# Patient Record
Sex: Male | Born: 1960
Health system: Southern US, Community
[De-identification: ages and names within clinical notes are randomized; demographics above are authoritative.]

## PROBLEM LIST (undated history)

## (undated) DIAGNOSIS — E119 Type 2 diabetes mellitus without complications: Secondary | ICD-10-CM

## (undated) DIAGNOSIS — C8293 Follicular lymphoma, unspecified, intra-abdominal lymph nodes: Secondary | ICD-10-CM

## (undated) DIAGNOSIS — J849 Interstitial pulmonary disease, unspecified: Secondary | ICD-10-CM

## (undated) DIAGNOSIS — K5792 Diverticulitis of intestine, part unspecified, without perforation or abscess without bleeding: Secondary | ICD-10-CM

## (undated) DIAGNOSIS — U071 COVID-19: Secondary | ICD-10-CM

## (undated) DIAGNOSIS — J9611 Chronic respiratory failure with hypoxia: Secondary | ICD-10-CM

## (undated) HISTORY — PX: COLON RESECTION: SHX5231

## (undated) HISTORY — DX: COVID-19: U07.1

## (undated) HISTORY — PX: CHOLECYSTECTOMY: SHX55

## (undated) HISTORY — DX: Type 2 diabetes mellitus without complications: E11.9

---
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
## 1998-02-02 ENCOUNTER — Emergency Department (HOSPITAL_COMMUNITY): Admission: EM | Admit: 1998-02-02 | Discharge: 1998-02-03 | Payer: Self-pay | Admitting: Emergency Medicine

## 2008-02-11 ENCOUNTER — Inpatient Hospital Stay: Payer: Self-pay | Admitting: Internal Medicine

## 2008-04-14 ENCOUNTER — Ambulatory Visit: Payer: Self-pay | Admitting: Gastroenterology

## 2008-06-05 ENCOUNTER — Ambulatory Visit: Payer: Self-pay | Admitting: Surgery

## 2008-06-11 ENCOUNTER — Inpatient Hospital Stay: Payer: Self-pay | Admitting: Surgery

## 2010-08-31 ENCOUNTER — Emergency Department: Payer: Self-pay | Admitting: Emergency Medicine

## 2010-09-03 ENCOUNTER — Encounter: Payer: Self-pay | Admitting: Emergency Medicine

## 2010-10-01 ENCOUNTER — Encounter: Payer: Self-pay | Admitting: Emergency Medicine

## 2013-07-15 ENCOUNTER — Ambulatory Visit: Payer: Self-pay | Admitting: Internal Medicine

## 2013-11-28 ENCOUNTER — Emergency Department: Payer: Self-pay | Admitting: Emergency Medicine

## 2019-01-29 ENCOUNTER — Other Ambulatory Visit: Payer: Self-pay

## 2019-01-29 ENCOUNTER — Inpatient Hospital Stay: Payer: BC Managed Care – PPO

## 2019-01-29 ENCOUNTER — Emergency Department: Payer: BC Managed Care – PPO

## 2019-01-29 ENCOUNTER — Encounter
Admission: EM | Disposition: A | Discharge status: Home / Self Care | Payer: Self-pay | Source: Home / Self Care | Attending: Internal Medicine

## 2019-01-29 ENCOUNTER — Inpatient Hospital Stay
Admission: EM | Admit: 2019-01-29 | Discharge: 2019-02-01 | DRG: 163 | Disposition: A | Discharge status: Home / Self Care | Payer: BC Managed Care – PPO | Attending: Internal Medicine | Admitting: Internal Medicine

## 2019-01-29 ENCOUNTER — Encounter: Payer: Self-pay | Admitting: Emergency Medicine

## 2019-01-29 ENCOUNTER — Inpatient Hospital Stay (HOSPITAL_COMMUNITY)
Admit: 2019-01-29 | Discharge: 2019-01-29 | Disposition: A | Discharge status: Home / Self Care | Payer: BC Managed Care – PPO | Attending: Vascular Surgery | Admitting: Vascular Surgery

## 2019-01-29 DIAGNOSIS — R599 Enlarged lymph nodes, unspecified: Secondary | ICD-10-CM | POA: Diagnosis not present

## 2019-01-29 DIAGNOSIS — E1165 Type 2 diabetes mellitus with hyperglycemia: Secondary | ICD-10-CM

## 2019-01-29 DIAGNOSIS — R590 Localized enlarged lymph nodes: Secondary | ICD-10-CM

## 2019-01-29 DIAGNOSIS — D751 Secondary polycythemia: Secondary | ICD-10-CM | POA: Diagnosis present

## 2019-01-29 DIAGNOSIS — I82412 Acute embolism and thrombosis of left femoral vein: Secondary | ICD-10-CM | POA: Diagnosis not present

## 2019-01-29 DIAGNOSIS — I2782 Chronic pulmonary embolism: Secondary | ICD-10-CM

## 2019-01-29 DIAGNOSIS — I2609 Other pulmonary embolism with acute cor pulmonale: Secondary | ICD-10-CM | POA: Diagnosis not present

## 2019-01-29 DIAGNOSIS — Z23 Encounter for immunization: Secondary | ICD-10-CM

## 2019-01-29 DIAGNOSIS — N2889 Other specified disorders of kidney and ureter: Secondary | ICD-10-CM | POA: Diagnosis not present

## 2019-01-29 DIAGNOSIS — M79669 Pain in unspecified lower leg: Secondary | ICD-10-CM

## 2019-01-29 DIAGNOSIS — K668 Other specified disorders of peritoneum: Secondary | ICD-10-CM | POA: Diagnosis not present

## 2019-01-29 DIAGNOSIS — E119 Type 2 diabetes mellitus without complications: Secondary | ICD-10-CM | POA: Diagnosis present

## 2019-01-29 DIAGNOSIS — I2699 Other pulmonary embolism without acute cor pulmonale: Secondary | ICD-10-CM | POA: Diagnosis not present

## 2019-01-29 DIAGNOSIS — D72829 Elevated white blood cell count, unspecified: Secondary | ICD-10-CM | POA: Diagnosis not present

## 2019-01-29 DIAGNOSIS — J9601 Acute respiratory failure with hypoxia: Secondary | ICD-10-CM | POA: Diagnosis present

## 2019-01-29 DIAGNOSIS — I361 Nonrheumatic tricuspid (valve) insufficiency: Secondary | ICD-10-CM

## 2019-01-29 DIAGNOSIS — Z20828 Contact with and (suspected) exposure to other viral communicable diseases: Secondary | ICD-10-CM | POA: Diagnosis not present

## 2019-01-29 DIAGNOSIS — R0602 Shortness of breath: Secondary | ICD-10-CM | POA: Diagnosis not present

## 2019-01-29 DIAGNOSIS — I82409 Acute embolism and thrombosis of unspecified deep veins of unspecified lower extremity: Secondary | ICD-10-CM | POA: Diagnosis not present

## 2019-01-29 HISTORY — PX: PULMONARY THROMBECTOMY: CATH118295

## 2019-01-29 HISTORY — DX: Diverticulitis of intestine, part unspecified, without perforation or abscess without bleeding: K57.92

## 2019-01-29 LAB — SARS CORONAVIRUS 2 BY RT PCR (HOSPITAL ORDER, PERFORMED IN ~~LOC~~ HOSPITAL LAB): SARS Coronavirus 2: NEGATIVE

## 2019-01-29 LAB — BASIC METABOLIC PANEL
Anion gap: 13 (ref 5–15)
BUN: 15 mg/dL (ref 6–20)
CO2: 21 mmol/L — ABNORMAL LOW (ref 22–32)
Calcium: 9.5 mg/dL (ref 8.9–10.3)
Chloride: 100 mmol/L (ref 98–111)
Creatinine, Ser: 1.1 mg/dL (ref 0.61–1.24)
GFR calc Af Amer: >60 mL/min (ref >60)
GFR calc non Af Amer: >60 mL/min (ref >60)
Glucose, Bld: 313 mg/dL — ABNORMAL HIGH (ref 70–99)
Potassium: 4.6 mmol/L (ref 3.5–5.1)
Sodium: 134 mmol/L — ABNORMAL LOW (ref 135–145)

## 2019-01-29 LAB — CBC
HCT: 54.8 % — ABNORMAL HIGH (ref 39.0–52.0)
Hemoglobin: 19.2 g/dL — ABNORMAL HIGH (ref 13.0–17.0)
MCH: 30.5 pg (ref 26.0–34.0)
MCHC: 35 g/dL (ref 30.0–36.0)
MCV: 87.1 fL (ref 80.0–100.0)
Platelets: 220 10*3/uL (ref 150–400)
RBC: 6.29 MIL/uL — ABNORMAL HIGH (ref 4.22–5.81)
RDW: 12.1 % (ref 11.5–15.5)
WBC: 11.1 10*3/uL — ABNORMAL HIGH (ref 4.0–10.5)
nRBC: 0 % (ref 0.0–0.2)

## 2019-01-29 LAB — PROTIME-INR
INR: 1.1 (ref 0.8–1.2)
Prothrombin Time: 14.5 seconds (ref 11.4–15.2)

## 2019-01-29 LAB — APTT: aPTT: 30 seconds (ref 24–36)

## 2019-01-29 LAB — HEPARIN LEVEL (UNFRACTIONATED): Heparin Unfractionated: 0.26 IU/mL — ABNORMAL LOW (ref 0.30–0.70)

## 2019-01-29 SURGERY — PULMONARY THROMBECTOMY
Anesthesia: Moderate Sedation

## 2019-01-29 MED ORDER — ONDANSETRON HCL 4 MG PO TABS: 4.0000 mg | ORAL_TABLET | Freq: Four times a day (QID) | ORAL | Status: DC | PRN

## 2019-01-29 MED ORDER — HEPARIN (PORCINE) 25000 UT/250ML-% IV SOLN
1850.0000 [IU]/h | INTRAVENOUS | Status: DC
  Administered 2019-01-29: 1650 [IU]/h via INTRAVENOUS
  Administered 2019-01-30 – 2019-01-31 (×3): 1850 [IU]/h via INTRAVENOUS
  Filled 2019-01-29 (×4): qty 250

## 2019-01-29 MED ORDER — ALTEPLASE 2 MG IJ SOLR: INTRAMUSCULAR | Status: DC | PRN

## 2019-01-29 MED ORDER — MIDAZOLAM HCL 2 MG/2ML IJ SOLN
INTRAMUSCULAR | Status: DC | PRN
  Administered 2019-01-29 (×2): 2 mg via INTRAVENOUS

## 2019-01-29 MED ORDER — FENTANYL CITRATE (PF) 100 MCG/2ML IJ SOLN
INTRAMUSCULAR | Status: DC | PRN
  Administered 2019-01-29 (×2): 50 ug via INTRAVENOUS

## 2019-01-29 MED ORDER — ALTEPLASE 2 MG IJ SOLR
INTRAMUSCULAR | Status: DC | PRN
  Administered 2019-01-29: 3 mg
  Administered 2019-01-29: 5 mg

## 2019-01-29 MED ORDER — HEPARIN SODIUM (PORCINE) 1000 UNIT/ML IJ SOLN
INTRAMUSCULAR | Status: DC | PRN
  Administered 2019-01-29: 3000 [IU] via INTRAVENOUS

## 2019-01-29 MED ORDER — IODIXANOL 320 MG/ML IV SOLN
INTRAVENOUS | Status: DC | PRN
  Administered 2019-01-29: 65 mL via INTRAVENOUS

## 2019-01-29 MED ORDER — ACETAMINOPHEN 650 MG RE SUPP: 650.0000 mg | Freq: Four times a day (QID) | RECTAL | Status: DC | PRN

## 2019-01-29 MED ORDER — INFLUENZA VAC SPLIT QUAD 0.5 ML IM SUSY
0.5000 mL | PREFILLED_SYRINGE | INTRAMUSCULAR | Status: AC
  Administered 2019-01-30: 0.5 mL via INTRAMUSCULAR
  Filled 2019-01-29: qty 0.5

## 2019-01-29 MED ORDER — ONDANSETRON HCL 4 MG/2ML IJ SOLN: 4.0000 mg | Freq: Four times a day (QID) | INTRAMUSCULAR | Status: DC | PRN

## 2019-01-29 MED ORDER — SODIUM CHLORIDE 0.9 % IV SOLN
Freq: Once | INTRAVENOUS | Status: AC
  Administered 2019-01-29: 16:00:00 via INTRAVENOUS

## 2019-01-29 MED ORDER — FENTANYL CITRATE (PF) 100 MCG/2ML IJ SOLN
INTRAMUSCULAR | Status: AC
  Filled 2019-01-29: qty 2

## 2019-01-29 MED ORDER — SODIUM CHLORIDE 0.9 % IV SOLN
INTRAVENOUS | Status: DC
  Administered 2019-01-29 – 2019-01-30 (×3): via INTRAVENOUS

## 2019-01-29 MED ORDER — ACETAMINOPHEN 325 MG PO TABS
650.0000 mg | ORAL_TABLET | Freq: Four times a day (QID) | ORAL | Status: DC | PRN
  Administered 2019-01-31: 650 mg via ORAL
  Filled 2019-01-29: qty 2

## 2019-01-29 MED ORDER — HEPARIN SODIUM (PORCINE) 1000 UNIT/ML IJ SOLN
INTRAMUSCULAR | Status: AC
  Filled 2019-01-29: qty 1

## 2019-01-29 MED ORDER — SODIUM CHLORIDE 0.9 % IV BOLUS
1000.0000 mL | Freq: Once | INTRAVENOUS | Status: AC
  Administered 2019-01-29: 1000 mL via INTRAVENOUS

## 2019-01-29 MED ORDER — CLINDAMYCIN PHOSPHATE 300 MG/50ML IV SOLN
INTRAVENOUS | Status: AC
  Administered 2019-01-29: 300 mg via INTRAVENOUS
  Filled 2019-01-29: qty 50

## 2019-01-29 MED ORDER — IOHEXOL 350 MG/ML SOLN
75.0000 mL | Freq: Once | INTRAVENOUS | Status: AC | PRN
  Administered 2019-01-29: 75 mL via INTRAVENOUS

## 2019-01-29 MED ORDER — MIDAZOLAM HCL 5 MG/5ML IJ SOLN
INTRAMUSCULAR | Status: AC
  Filled 2019-01-29: qty 5

## 2019-01-29 MED ORDER — HEPARIN BOLUS VIA INFUSION
6000.0000 [IU] | Freq: Once | INTRAVENOUS | Status: AC
  Administered 2019-01-29: 6000 [IU] via INTRAVENOUS
  Filled 2019-01-29: qty 6000

## 2019-01-29 MED ORDER — CLINDAMYCIN PHOSPHATE 300 MG/50ML IV SOLN
300.0000 mg | Freq: Once | INTRAVENOUS | Status: AC
  Administered 2019-01-29 (×2): 300 mg via INTRAVENOUS
  Filled 2019-01-29: qty 50

## 2019-01-29 SURGICAL SUPPLY — 18 items
CANISTER PENUMBRA ENGINE (MISCELLANEOUS) ×2 IMPLANT
CATH ANGIO 5F 100CM .035 PIG (CATHETERS) ×2 IMPLANT
CATH INDIGO 12HTORQ 100 (CATHETERS) ×2 IMPLANT
CATH INDIGO SEP 12 (CATHETERS) ×2 IMPLANT
CATH INFINITI JR4 5F (CATHETERS) ×2 IMPLANT
CATH SELECT BERN TIP 5F 130 (CATHETERS) ×2 IMPLANT
DEVICE SAFEGUARD 24CM (GAUZE/BANDAGES/DRESSINGS) ×2 IMPLANT
GLIDEWIRE ADV .035X260CM (WIRE) ×2 IMPLANT
NDL ENTRY 21GA 7CM ECHOTIP (NEEDLE) IMPLANT
NEEDLE ENTRY 21GA 7CM ECHOTIP (NEEDLE) ×3 IMPLANT
PACK ANGIOGRAPHY (CUSTOM PROCEDURE TRAY) ×3 IMPLANT
SET INTRO CAPELLA COAXIAL (SET/KITS/TRAYS/PACK) ×2 IMPLANT
SHEATH ANSEL 12FRX45CM (SHEATH) ×2 IMPLANT
SHEATH PINNACLE 11FRX10 (SHEATH) ×2 IMPLANT
SYR MEDRAD MARK 7 150ML (SYRINGE) ×2 IMPLANT
TUBING CONTRAST HIGH PRESS 72 (TUBING) ×5 IMPLANT
WIRE J 3MM .035X145CM (WIRE) ×2 IMPLANT
WIRE MAGIC TORQUE 260C (WIRE) ×2 IMPLANT

## 2019-01-29 NOTE — Progress Notes (Signed)
Family Meeting Note  Advance Directive:yes  Today a meeting took place with the Patient.    The following clinical team members were present during this meeting:MD  The following were discussed:Patient's diagnosis: Acute hypoxic respiratory failure with massive pulmonary embolism bilateral, polycythemia, leukocytosis and sinus tachycardia will be admitted to the hospital.  Vascular surgery consulted and hematology consulted.  Plan of care discussed in detail with the patient he verbalized understanding of the plan.  , Patient's progosis: Unable to determine and Goals for treatment: Full Code  Additional follow-up to be provided: Hospitalist, vascular surgery, oncology  Time spent during discussion:18 min   Nicholes Mango, MD

## 2019-01-29 NOTE — ED Triage Notes (Addendum)
Has been having short of breath on and off worse today.  Not sure about fever.  Says cough when he has to clear chest only.  Start about 2 week ago Per [facility] patients ox went to 90 when they walked him.

## 2019-01-29 NOTE — Progress Notes (Signed)
Patient has right heart strain due to embolism, heart rate has been in the low 100s.

## 2019-01-29 NOTE — Consult Note (Signed)
[place] for Heparin Drip Indication: pulmonary embolus  Allergies  Allergen Reactions  . Penicillins Rash    Patient Measurements: Height: 6' (182.9 cm) Weight: 220 lb 12.8 oz (100.2 kg) IBW/kg (Calculated) : 77.6 Heparin Dosing Weight: 97.8 kg  Vital Signs: Temp: 98.6 F (37 C) (09/29 1954) Temp Source: Oral (09/29 1954) BP: 100/79 (09/29 1954) Pulse Rate: 103 (09/29 2146)  Labs: Recent Labs    01/29/19 1043 01/29/19 1540 01/29/19 2156  HGB 19.2*  --   --   HCT 54.8*  --   --   PLT 220  --   --   APTT  --  30  --   LABPROT  --  14.5  --   INR  --  1.1  --   HEPARINUNFRC  --   --  0.26*  CREATININE 1.10  --   --     Estimated Creatinine Clearance: 87.5 mL/min (by C-G formula based on SCr of 1.1 mg/dL).   Medical History: Past Medical History:  Diagnosis Date  . Diverticulitis     Medications:  No medications prior to admission.   Scheduled:  Infusions:  PRN:  Anti-infectives (From admission, onward)   Start     Dose/Rate Route Frequency Ordered Stop   01/30/19 0000  clindamycin (CLEOCIN) IVPB 300 mg    Note to Pharmacy: To be given in specials   300 mg 100 mL/hr over 30 Minutes Intravenous  Once 01/29/19 1607 01/29/19 1725      Assessment: Pharmacy has been consulted to initiate Heparin drip in 58yo patient. Upon CT Angiogram of chest, found with extensive bilateral pulmonary emboli, somewhat more severe on the right than on the left. Patient has no prior history of PTA anticoagulant use. Baseline labs have been ordered and are pending.  [id number] @2156  HL: 0.26, subtherapeutic. Called nurse to verify if there were heparin interruptions and was told there was were none. However, she was unable to verify if it was interrupted in Cath lab. Per Mar, it was stopped at 1640 and restarted 1815.   Goal of Therapy:  Heparin level 0.3-0.7 units/ml Monitor platelets by anticoagulation protocol: Yes   Plan:  Continue  heparin infusion at 1650 units/hr Check anti-Xa level in 2 hours (for a total of 6 hours after restarting infusion) and daily while on heparin Continue to monitor H&H and platelets  Vidya Bamford R Glynnis Gavel 01/29/2019,10:21 PM

## 2019-01-29 NOTE — ED Notes (Signed)
Pt back to room. Alert/calm.

## 2019-01-29 NOTE — ED Notes (Signed)
Blue tube sent to lab with white label. Won't allow this RN to print yellow label; locked out. Called lab to inform.

## 2019-01-29 NOTE — ED Notes (Signed)
Pt asked to remove pants, boxers, anything metal, etc in order to get ready for procedure. Pt already has gown on. Denies any questions about procedure. Provider assisting with/completing procedure just left after talking it through with pt.

## 2019-01-29 NOTE — ED Notes (Signed)
This RN called lab as pt's chart locked and can't print covid swab label. Gwen in lab stated this RN can send with white label.

## 2019-01-29 NOTE — ED Notes (Addendum)
Pt c/o SOB/cough; denies HA/CP/body aches/changes in taste or smell/sore throat. Pt denies COPD/asthma or home oxygen. Pt currently on 2L. Pt SOB while resting in bed. Pt denies smoking.

## 2019-01-29 NOTE — ED Notes (Signed)
Report given to specials 

## 2019-01-29 NOTE — Progress Notes (Signed)
Pt admitted from [facility] s/p rt/lt lung thrombectomy.  A&O x3, no distress on 3LO2 per [place].  Cardiac monitor placed on pt and verified.  Denies pain at this time.  PAD to rt groin with 57ml air in place.  CB in reach, SR up x3, bed alarm on.  Wife at bedside.

## 2019-01-29 NOTE — Op Note (Signed)
[facility]  Percutaneous Study/Intervention Procedural Note   Date of Surgery: 01/29/2019,6:04 PM  Surgeon Leotis Pain, MD  Pre-operative Diagnosis: Symptomatic bilateral pulmonary emboli  Post-operative diagnosis:  Same  Procedure(s) Performed:  1.  Contrast injection right heart  2.  Thrombolysis  Right and left pulmonary arteries  3.  Mechanical thrombectomy to the right upper lobe, middle lobe, and lower lobe pulmonary arteries as well as the main right pulmonary artery and to the left upper lobe and left lower lobe pulmonary arteries  4.  Selective catheter placement right upper lobe, middle lobe, and lower lobe pulmonary arteries  5.  Selective catheter placement left upper lobe and lower lobe pulmonary arteries    Anesthesia: Conscious sedation was administered under my direct supervision by the interventional radiology RN. IV Versed plus fentanyl were utilized. Continuous ECG, pulse oximetry and blood pressure was monitored throughout the entire procedure.  Versed and fentanyl were administered intravenously.  Conscious sedation was administered for a total of 50 minutes using 4 mg of Versed and 100 mcg of fentanyl.  Sheath: 12 French right groin  Contrast: 85 cc   EBL: 500 cc  Fluoroscopy Time: 28.2 minutes  Indications:  Patient presents with extensive bilateral pulmonary emboli. The patient is symptomatic with hypoxemia and dyspnea on exertion.  There is evidence of right heart strain on the CT angiogram.  The patient is markedly tachycardic with a heart rate in the 120s and has an oxygen requirement.  He was otherwise fairly healthy and the patient is otherwise a good candidate for intervention and even the long-term benefits pulmonary angiography with thrombolysis is offered. The risks and benefits are reviewed long-term benefits are discussed. All questions are answered patient agrees to proceed.  Procedure:  Hashim Sermersheim Rhodesis a 58 y.o. male who was  identified and appropriate procedural time out was performed.  The patient was then placed supine on the table and prepped and draped in the usual sterile fashion.  Ultrasound was used to evaluate the right common femoral vein.  It was patent, as it was echolucent and compressible.  A digital ultrasound image was acquired for the permanent record.  A micropuncture needle was used to access the right common femoral vein under direct ultrasound guidance.  A microwire was then advanced under fluoroscopic guidance followed by micro-sheath.  A 0.035 J wire was advanced without resistance and a 5Fr sheath was placed and then upsized to an 12 [place] sheath.    The wire and pigtail catheter were then negotiated into the right atrium and bolus injection of contrast was utilized to demonstrate the right ventricle and the pulmonary artery outflow. The wire and catheter were then negotiated into the main pulmonary artery on the left and then into the left lower lobe where hand injection of contrast was utilized to demonstrate the pulmonary arteries and confirm the locations of the pulmonary emboli.  Advancing the JR4 catheter into the left upper lobe pulmonary artery showed a nearly occlusive embolus in the left upper lobe pulmonary artery although the left lower lobe pulmonary artery had more extensive thrombus.  TPA was reconstituted and delivered onto the table. A total of 8 milligrams of TPA was utilized.  3 mg was administered on the left side and then the JR4 catheter was advanced into the right main pulmonary artery where imaging was performed demonstrating extensive thrombus in the right upper lobe, middle lobe, and lower lobe as well as the main pulmonary artery.  5mg  was  administered on the right side in the distal main pulmonary artery. This was then allowed to dwell.  The Penumbra Cat 12 catheter was then advanced up into the pulmonary vasculature. The right lung was addressed first. Catheter was negotiated  into the right lower lobe pulmonary artery and selective imaging showed extensive thrombus in the right lower lobe pulmonary artery and mechanical thrombectomy was performed. Follow-up imaging demonstrated a good result and therefore the catheter was renegotiated into the right middle lobe pulmonary artery and imaging showed a nearly occlusive blob of thrombus in the right middle lobe pulmonary artery.  Suction was applied and again mechanical thrombectomy was performed. Passes were made with both the Penumbra catheter itself as well as introducing the separator. Follow-up imaging was then performed.  Showing marked improvement in the right lower lobe.  Then using the advantage wire and the select catheter, I was able to navigate somewhat tediously into the right upper lobe pulmonary artery where imaging showed extensive thrombus in the right upper lobe pulmonary artery and its primary branches.  The penumbra cat 12 device was then advanced over the select catheter into the right upper lobe and thrombectomy was performed using the separator.  The Penumbra Cat 12 catheter was then negotiated to the opposite side. The left lung was addressed second. Catheter was negotiated into the left lower lobe and mechanical thrombectomy was performed using passes with and without the separator. Follow-up imaging demonstrated a good result and therefore the catheter was renegotiated into the left upper lobe pulmonary artery and again mechanical thrombectomy was performed after selective imaging. Passes were made with both the Penumbra catheter itself as well as introducing the separator. Follow-up imaging was then performed.  There was a marked improvement with a small amount of residual thrombus in the left upper lobe and a small to medium amount of residual thrombus in the left lower lobe.  The cat 12 catheter was then placed at the bifurcation of the pulmonary arteries and a large injected image was performed.  This showed  only a small amount of residual thrombus in the right upper, lower, and middle lobes and the small amount of residual thrombus in the left upper lobe with a small to medium amount of residual thrombus in the left lower lobe.  This was a drastic improvement and at this point no further intervention was necessary.  Catheter was removed the sheath is then pulled and pressures held. A safeguard is placed.    Findings:   Right heart imaging:  Right atrium and right ventricle and the pulmonary outflow tract appears normal to enlarged  Right lung: Extensive pulmonary embolus involving the right main pulmonary artery and all 3 lobar branches seen on imaging of the lobar branches as well as from the main pulmonary injection.  Left lung: Near occlusive thrombus in the left upper lobe pulmonary artery and a large amount of thrombus in the left lower lobe pulmonary artery    Disposition: Patient was taken to the recovery room in stable condition having tolerated the procedure well.    01/29/2019,6:04 PM

## 2019-01-29 NOTE — Consult Note (Signed)
ANTICOAGULATION CONSULT NOTE - Initial Consult  Pharmacy Consult for Heparin Drip Indication: pulmonary embolus  Allergies  Allergen Reactions  . Penicillins Rash    Patient Measurements: Height: 6' (182.9 cm) Weight: 220 lb (99.8 kg) IBW/kg (Calculated) : 77.6 Heparin Dosing Weight: 97.8 kg  Vital Signs: Temp: 98.8 F (37.1 C) (09/29 1002) Temp Source: Oral (09/29 1002) BP: 124/91 (09/29 1430) Pulse Rate: 112 (09/29 1430)  Labs: Recent Labs    01/29/19 1043  HGB 19.2*  HCT 54.8*  PLT 220  CREATININE 1.10    Estimated Creatinine Clearance: 87.4 mL/min (by C-G formula based on SCr of 1.1 mg/dL).   Medical History: Past Medical History:  Diagnosis Date  . Diverticulitis     Medications:  (Not in a hospital admission)  Scheduled:  Infusions:  PRN:  Anti-infectives (From admission, onward)   None      Assessment: Pharmacy has been consulted to initiate Heparin drip in 58yo patient. Upon CT Angiogram of chest, found with extensive bilateral pulmonary emboli, somewhat more severe on the right than on the left. Patient has no prior history of PTA anticoagulant use. Baseline labs have been ordered and are pending.  Goal of Therapy:  Heparin level 0.3-0.7 units/ml Monitor platelets by anticoagulation protocol: Yes   Plan:  Give 6000 units bolus x 1 Start heparin infusion at 1650 units/hr Check anti-Xa level in 6 hours and daily while on heparin Continue to monitor H&H and platelets  Emara Lichter A Zebastian Carico 01/29/2019,3:11 PM

## 2019-01-29 NOTE — Consult Note (Signed)
[facility] Vascular Consult Note  MRN : [id number]  Edward Atkinson is a 58 y.o. (Dec 16, 1960) male who presents with chief complaint of  Chief Complaint  Patient presents with  . Shortness of Breath   History of Present Illness:  The patient is a 58 year old male with no significant medical history who presented to the [facility] emergency department with progressively worsening shortness of breath.  Patient endorses a history of approximately 2 weeks of progressively worsening and intermittent bouts of shortness of breath.  He notes that over the last 2 days his dyspnea worsened significantly prompting him to seek medical attention.  His shortness of breath worsens with ambulation.  The patient denies any recent history of surgery, trauma, recent travel, history of DVT, family history/personal history of bleeding/clotting disorders.  Denies any fever, nausea vomiting.    COVID negative.  CTA of the chest (01/29/19): 1. Extensive pulmonary embolus bilaterally, somewhat more severe on the right than on the left. Positive for acute PE with CT evidence of right heart strain (RV/LV Ratio = 1.6) consistent with at least submassive (intermediate risk) PE.  2. Enlargement of the main pulmonary outflow tract, a finding indicative of underlying pulmonary arterial hypertension. 3. Incomplete visualization of the pancreas/peripancreatic region. There is diffuse soft tissue fullness involving and potentially surrounding the pancreas. May be adenopathy in this area. Dedicated CT or MR of the abdomen with particular attention the pancreatic region advised within patient able based on treatment for pulmonary embolus with right heart strain acutely. 4. Atelectasis right lower lobe with questionable mild superimposed pneumonia. 5.  No adenopathy evident in the thoracic region. 6.  Gallbladder absent.  Vascular surgery was consulted by Dr. Margaretmary Eddy for possible  pulmonary lysis  Current Facility-Administered Medications  Medication Dose Route Frequency Provider Last Rate Last Dose  . fentaNYL (SUBLIMAZE) 100 MCG/2ML injection           . heparin 1000 UNIT/ML injection           . midazolam (VERSED) 5 MG/5ML injection           . 0.9 %  sodium chloride infusion   Intravenous Continuous Gouru, Aruna, MD      . acetaminophen (TYLENOL) tablet 650 mg  650 mg Oral Q6H PRN Gouru, Aruna, MD       Or  . acetaminophen (TYLENOL) suppository 650 mg  650 mg Rectal Q6H PRN Gouru, Aruna, MD      . Derrill Memo ON 01/30/2019] clindamycin (CLEOCIN) IVPB 300 mg  300 mg Intravenous Once Abagale Boulos A, PA-C      . heparin ADULT infusion 100 units/mL (25000 units/264mL sodium chloride 0.45%)  1,650 Units/hr Intravenous Continuous Nazari, Walid A, RPH 16.5 mL/hr at 01/29/19 1604 1,650 Units/hr at 01/29/19 1604  . ondansetron (ZOFRAN) tablet 4 mg  4 mg Oral Q6H PRN Gouru, Aruna, MD       Or  . ondansetron (ZOFRAN) injection 4 mg  4 mg Intravenous Q6H PRN Gouru, Aruna, MD       No current outpatient medications on file.   Past Medical History:  Diagnosis Date  . Diverticulitis    Past Surgical History:  Procedure Laterality Date  . CHOLECYSTECTOMY    . COLON RESECTION     Social History Social History   Tobacco Use  . Smoking status: Never Smoker  . Smokeless tobacco: Never Used  Substance Use Topics  . Alcohol use: Yes  . Drug use: Not on file  Family History No family history on file.  Denies family history of peripheral artery disease, venous disease and/or bleeding/clotting disorders.  Allergies  Allergen Reactions  . Penicillins Rash   REVIEW OF SYSTEMS (Negative unless checked)  Constitutional: [] Weight loss  [] Fever  [] Chills Cardiac: [] Chest pain   [x] Chest pressure   [x] Palpitations   [x] Shortness of breath when laying flat   [x] Shortness of breath at rest   [x] Shortness of breath with exertion. Vascular:  [] Pain in legs with walking    [] Pain in legs at rest   [] Pain in legs when laying flat   [] Claudication   [] Pain in feet when walking  [] Pain in feet at rest  [] Pain in feet when laying flat   [] History of DVT   [] Phlebitis   [] Swelling in legs   [] Varicose veins   [] Non-healing ulcers Pulmonary:   [] Uses home oxygen   [] Productive cough   [] Hemoptysis   [] Wheeze  [] COPD   [] Asthma Neurologic:  [] Dizziness  [] Blackouts   [] Seizures   [] History of stroke   [] History of TIA  [] Aphasia   [] Temporary blindness   [] Dysphagia   [] Weakness or numbness in arms   [] Weakness or numbness in legs Musculoskeletal:  [] Arthritis   [] Joint swelling   [] Joint pain   [] Low back pain Hematologic:  [] Easy bruising  [] Easy bleeding   [] Hypercoagulable state   [] Anemic  [] Hepatitis Gastrointestinal:  [] Blood in stool   [] Vomiting blood  [] Gastroesophageal reflux/heartburn   [] Difficulty swallowing. Genitourinary:  [] Chronic kidney disease   [] Difficult urination  [] Frequent urination  [] Burning with urination   [] Blood in urine Skin:  [] Rashes   [] Ulcers   [] Wounds Psychological:  [] History of anxiety   []  History of major depression.  Physical Examination  Vitals:   01/29/19 1530 01/29/19 1600 01/29/19 1608 01/29/19 1610  BP: (!) 141/117 (!) 162/113    Pulse: (!) 116 (!) 118  (!) 129  Resp:   (!) 24   Temp:      TempSrc:      SpO2: 97% 96%  92%  Weight:      Height:       Body mass index is 29.84 kg/m. Gen:  WD/WN, NAD Head: [place]/AT, No temporalis wasting. Prominent temp pulse not noted. Ear/Nose/Throat: Hearing grossly intact, nares w/o erythema or drainage, oropharynx w/o Erythema/Exudate Eyes: Sclera non-icteric, conjunctiva clear Neck: Trachea midline.  No JVD.  Pulmonary:  Good air movement, respirations labored, equal bilaterally.  Cardiac: Tachycardia, normal S1, S2. Vascular:  Vessel Right Left  Radial Palpable Palpable  Ulnar Palpable Palpable  Brachial Palpable Palpable  Carotid Palpable, without bruit Palpable, without  bruit  Aorta Not palpable N/A  Femoral Palpable Palpable  Popliteal Palpable Palpable  PT Palpable Palpable  DP Palpable Palpable   Gastrointestinal: soft, non-tender/non-distended. No guarding/reflex.  Musculoskeletal: M/S 5/5 throughout.  Extremities without ischemic changes.  No deformity or atrophy. No edema. Neurologic: Sensation grossly intact in extremities.  Symmetrical.  Speech is fluent. Motor exam as listed above. Psychiatric: Judgment intact, Mood & affect appropriate for pt's clinical situation. Dermatologic: No rashes or ulcers noted.  No cellulitis or open wounds. Lymph : No Cervical, Axillary, or Inguinal lymphadenopathy.  CBC Lab Results  Component Value Date   WBC 11.1 (H) 01/29/2019   HGB 19.2 (H) 01/29/2019   HCT 54.8 (H) 01/29/2019   MCV 87.1 01/29/2019   PLT 220 01/29/2019   BMET    Component Value Date/Time   NA 134 (L) 01/29/2019 1043   K  4.6 01/29/2019 1043   CL 100 01/29/2019 1043   CO2 21 (L) 01/29/2019 1043   GLUCOSE 313 (H) 01/29/2019 1043   BUN 15 01/29/2019 1043   CREATININE 1.10 01/29/2019 1043   CALCIUM 9.5 01/29/2019 1043   GFRNONAA >60 01/29/2019 1043   GFRAA >60 01/29/2019 1043   Estimated Creatinine Clearance: 87.4 mL/min (by C-G formula based on SCr of 1.1 mg/dL).  COAG No results found for: INR, PROTIME  Radiology Dg Chest 2 View  Result Date: 01/29/2019 CLINICAL DATA:  Shortness of breath. EXAM: CHEST - 2 VIEW COMPARISON:  None. FINDINGS: The cardiomediastinal silhouette is within normal limits. The lungs are hypoinflated with mild-to-moderate elevation of the right hemidiaphragm. No airspace consolidation, edema, pleural effusion, pneumothorax is identified. There is mild anterior wedging of 2 adjacent thoracic vertebral bodies, approximately T7 and T8. IMPRESSION: 1. Hypoinflation and right hemidiaphragm elevation without evidence of pneumonia or edema. 2. Mild anterior wedging of 2 adjacent mid thoracic vertebral bodies, age  indeterminate. Electronically Signed   By: Logan Bores M.D.   On: 01/29/2019 12:43   Ct Angio Chest Pe W And/or Wo Contrast  Result Date: 01/29/2019 CLINICAL DATA:  Shortness of breath and hypoxia. EXAM: CT ANGIOGRAPHY CHEST WITH CONTRAST TECHNIQUE: Multidetector CT imaging of the chest was performed using the standard protocol during bolus administration of intravenous contrast. Multiplanar CT image reconstructions and MIPs were obtained to evaluate the vascular anatomy. CONTRAST:  56mL OMNIPAQUE IOHEXOL 350 MG/ML SOLN COMPARISON:  Chest radiograph January 29, 2019 FINDINGS: Cardiovascular: Extensive pulmonary embolus arises from the proximal right main pulmonary artery extending peripherally with pulmonary embolus in multiple right upper and right middle lobe pulmonary artery branches. There is a lesser degree of pulmonary embolus extend into the right upper lobe pulmonary arteries, most notably posteriorly. There are multiple pulmonary emboli in the left lower lobe pulmonary arterial vessels. There is pulmonary embolus, incompletely obstructing in the distal most aspect of the left main pulmonary artery. The right ventricle to left ventricle diameter ratio is 1.6, consistent with right heart strain. There is no demonstrable thoracic aortic aneurysm or dissection. The visualized great vessels appear unremarkable. There is no pericardial effusion or pericardial thickening. The main pulmonary outflow tract measures 3.5 cm in diameter. Mediastinum/Nodes: Visualized thyroid appears normal. There is no appreciable thoracic adenopathy. No esophageal lesions are evident. Lungs/Pleura: There is atelectatic change in the right lower lobe. There may be mild superimposed pneumonia in this area. Lungs elsewhere are clear. No demonstrable pulmonary infarct or pleural effusion. Upper Abdomen: There is diffuse soft tissue fullness involving and surrounding the pancreas. This soft tissue fullness extends laterally on the  left to the splenic hilum. There is concern for adenopathy in this region. Gallbladder absent. Visualized upper abdominal structures otherwise appear normal. Musculoskeletal: There is slight anterior wedging of the T8 vertebral body. No blastic or lytic bone lesions. No chest wall lesions evident. Review of the MIP images confirms the above findings. IMPRESSION: 1. Extensive pulmonary embolus bilaterally, somewhat more severe on the right than on the left. Positive for acute PE with CT evidence of right heart strain (RV/LV Ratio = 1.6) consistent with at least submassive (intermediate risk) PE. The presence of right heart strain has been associated with an increased risk of morbidity and mortality. Please activate Code PE by paging [phone]. 2. Enlargement of the main pulmonary outflow tract, a finding indicative of underlying pulmonary arterial hypertension. 3. Incomplete visualization of the pancreas/peripancreatic region. There is diffuse soft tissue fullness involving  and potentially surrounding the pancreas. May be adenopathy in this area. Dedicated CT or MR of the abdomen with particular attention the pancreatic region advised within patient able based on treatment for pulmonary embolus with right heart strain acutely. 4. Atelectasis right lower lobe with questionable mild superimposed pneumonia. 5.  No adenopathy evident in the thoracic region. 6.  Gallbladder absent. Critical Value/emergent results were called by telephone at the time of interpretation on 01/29/2019 at 3:03 pm to [place] , who verbally acknowledged these results. Electronically Signed   By: Lowella Grip III M.D.   On: 01/29/2019 15:04   Assessment/Plan The patient is a 58 year old male with no significant medical history who presented to the [facility] emergency department with progressively worsening shortness of breath. 1.  Pulmonary embolism: Patient with progressively worsening  shortness of breath over the last 2 weeks.  Notes that his dyspnea has been intermittent however over the last 2 days has significantly progressed prompting him to seek medical attention.  Patient was seen at [facility] clinic today found to be hypoxic and tachycardic and sent to the ED for further evaluation.  CTA in the emergency department was notable for submassive bilateral right greater than left pulmonary embolism with right heart strain.  The patient is tachycardic into the 120s, on supplemental oxygen (2L nasal cannula) with somewhat labored respirations.  Recommend a pulmonary lysis to decrease clot burden, improve patient's dyspnea, and alleviate right heart strain.  Procedure, risks and benefits explained to the patient.  All questions answered.  The patient wishes to proceed.  Agree with initiation of heparin. 2. DVT: Patient has been ordered a bilateral lower extremity venous duplex to assess for any DVT.  Pending results. At this time he does not complain of any bilateral lower extremity discomfort.  He notes that he does get intermittent cramps at times.  Physical exam is relatively unremarkable to the lower extremity 3.  Polycythemia: Hbg: 19.2 - will consult heme-onc further recommendations.  Discussed with Dr. Mayme Genta, PA-C  01/29/2019 4:19 PM  This note was created with Dragon medical transcription system.  Any error is purely unintentional

## 2019-01-29 NOTE — Progress Notes (Signed)
Received consult. I went to see patient in ED and he has been taken to OR. Will see patient in AM.

## 2019-01-29 NOTE — ED Notes (Signed)
Notified Levada Dy in lab that INR/PTT add-on placed. Will send blue tube.

## 2019-01-29 NOTE — H&P (Signed)
[facility] History & Physical Update  The patient was interviewed and re-examined.  The patient's previous History and Physical has been reviewed and is unchanged.  There is no change in the plan of care. We plan to proceed with the scheduled procedure.  Leotis Pain, MD  01/29/2019, 4:59 PM

## 2019-01-29 NOTE — ED Notes (Signed)
Pt dec to 91% RA while sitting in bed. Placed back on 2L via [place].

## 2019-01-29 NOTE — ED Notes (Signed)
Pt on 2 liters in lobby while waiting.

## 2019-01-29 NOTE — Plan of Care (Signed)
  Problem: Education: Goal: Knowledge of General Education information will improve Description: Including pain rating scale, medication(s)/side effects and non-pharmacologic comfort measures Outcome: Progressing   Problem: Health Behavior/Discharge Planning: Goal: Ability to manage health-related needs will improve Outcome: Progressing   Problem: Cardiovascular: Goal: Vascular access site(s) Level 0-1 will be maintained Outcome: Progressing   Problem: Clinical Measurements: Goal: Respiratory complications will improve Outcome: Completed/Met Note: Patient is tolerating oxygen weaning. Pulse ox is stable. Goal: Cardiovascular complication will be avoided Outcome: Completed/Met   Problem: Skin Integrity: Goal: Risk for impaired skin integrity will decrease Outcome: Completed/Met

## 2019-01-29 NOTE — ED Provider Notes (Signed)
[facility] Emergency Department Provider Note  Time seen: 2:06 PM  I have reviewed the triage vital signs and the nursing notes.   HISTORY  Chief Complaint Shortness of Breath  HPI Edward Atkinson is a 58 y.o. male with no past medical history takes no medications presents to the emergency department for shortness of breath.  According to the patient for the past week or so he has been short of breath with occasional cough.  He went to [facility] clinic today for evaluation and was sent to the emergency department given hypoxia in the upper 80s on room air.  Patient denies any chest pain, does state shortness of breath worse when lying flat or with exertion.  States cough.  Denies any fever.  No leg pain or swelling.   Past Medical History:  Diagnosis Date  . Diverticulitis     There are no active problems to display for this patient.   Past Surgical History:  Procedure Laterality Date  . CHOLECYSTECTOMY    . COLON RESECTION      Prior to Admission medications   Not on File    Allergies  Allergen Reactions  . Penicillins Rash    No family history on file.  Social History Social History   Tobacco Use  . Smoking status: Never Smoker  . Smokeless tobacco: Never Used  Substance Use Topics  . Alcohol use: Yes  . Drug use: Not on file    Review of Systems Constitutional: Negative for fever Cardiovascular: Negative for chest pain. Respiratory: Positive shortness breath.  Positive for cough. Gastrointestinal: Negative for abdominal pain, Musculoskeletal: Negative for musculoskeletal complaints Neurological: Negative for headache All other ROS negative  ____________________________________________   PHYSICAL EXAM:  VITAL SIGNS: ED Triage Vitals  Enc Vitals Group     BP 01/29/19 1002 (!) 133/91     Pulse Rate 01/29/19 1002 (!) 124     Resp --      Temp 01/29/19 1002 98.8 F (37.1 C)     Temp Source 01/29/19 1002 Oral     SpO2  01/29/19 1002 95 %     Weight 01/29/19 1003 220 lb (99.8 kg)     Height 01/29/19 1003 6' (1.829 m)     Head Circumference --      Peak Flow --      Pain Score 01/29/19 1009 0     Pain Loc --      Pain Edu? --      Excl. in [place]? --    Constitutional: Alert and oriented. Well appearing and in no distress. Eyes: Normal exam ENT      Head: Normocephalic and atraumatic.      Mouth/Throat: Mucous membranes are moist. Cardiovascular: Normal rate, regular rhythm.  Respiratory: Normal respiratory effort without tachypnea nor retractions. Breath sounds are clear  Gastrointestinal: Soft and nontender. No distention.  Musculoskeletal: Nontender with normal range of motion in all extremities. Neurologic:  Normal speech and language. No gross focal neurologic deficits  Skin:  Skin is warm, dry and intact.  Psychiatric: Mood and affect are normal.   ____________________________________________    EKG  EKG viewed and interpreted by myself shows sinus tachycardia 126 bpm with a narrow QRS, normal axis, normal intervals, nonspecific ST changes without ST elevation.  ____________________________________________    RADIOLOGY  Chest x-ray negative CTA shows diffuse large pulmonary emboli with right heart strain  ____________________________________________   INITIAL IMPRESSION / ASSESSMENT AND PLAN / ED COURSE  Pertinent  labs & imaging results that were available during my care of the patient were reviewed by me and considered in my medical decision making (see chart for details).   Patient presents to the emergency department for shortness of breath and cough.  Patient is tachycardic and hypoxic on room air.  Denies any chest pain.  Lab work shows elevated blood glucose patient denies any history of diabetes and has not eaten or drinking anything today.  Lab work also shows an elevated hemoglobin/hematocrit possibly indicating polycythemia although the patient states he is never been  diagnosed with this in the past either.  We will begin IV hydration.  We will order a CTA of the chest.  CTA of the chest is consistent with large pulmonary emboli given the right heart strain and large pulmonary emboli I talked to the intensivist at [facility].  They state at this time as the patient is otherwise hemodynamically stable besides 2 L of nasal cannula oxygen and and they do not believe the patient is a candidate for ECOS as of yet, and recommend admission locally for an echocardiogram.  I will start on a heparin infusion.  Edward Atkinson was evaluated in Emergency Department on 01/29/2019 for the symptoms described in the history of present illness. He was evaluated in the context of the global COVID-19 pandemic, which necessitated consideration that the patient might be at risk for infection with the SARS-CoV-2 virus that causes COVID-19. Institutional protocols and algorithms that pertain to the evaluation of patients at risk for COVID-19 are in a state of rapid change based on information released by regulatory bodies including the CDC and federal and state organizations. These policies and algorithms were followed during the patient's care in the ED.  CRITICAL CARE Performed by: Harvest Dark   Total critical care time: 30 minutes  Critical care time was exclusive of separately billable procedures and treating other patients.  Critical care was necessary to treat or prevent imminent or life-threatening deterioration.  Critical care was time spent personally by me on the following activities: development of treatment plan with patient and/or surrogate as well as nursing, discussions with consultants, evaluation of patient's response to treatment, examination of patient, obtaining history from patient or surrogate, ordering and performing treatments and interventions, ordering and review of laboratory studies, ordering and review of radiographic studies, pulse oximetry and  re-evaluation of patient's condition.   ____________________________________________   FINAL CLINICAL IMPRESSION(S) / ED DIAGNOSES  Pulmonary embolism   Harvest Dark, MD 01/29/19 1535

## 2019-01-29 NOTE — H&P (Signed)
[facility] [place] NAME: Edward Atkinson    MR#:  [id number]  DATE OF BIRTH:  Sep 21, 1960  DATE OF ADMISSION:  01/29/2019  PRIMARY CARE PHYSICIAN: Rusty Aus, MD   REQUESTING/REFERRING PHYSICIAN: Harvest Dark, MD  CHIEF COMPLAINT:  Shortness of breath  HISTORY OF PRESENT ILLNESS:  Edward Atkinson  is a 58 y.o. male with no past medical history, no recent travel or broken bones is presenting to the ED with a chief complaint of shortness of breath.  Patient has been having cough for the past 1 week associated with shortness of breath.  He went to [facility] [facility] today for evaluation and he was hypoxemic with pulse ox at around 80% on room air.  Patient denies any chest pain and he was sent over to the hospital for further evaluation.  CT angiogram has revealed bilateral massive pulmonary embolism with right heart strain and hospitalist team is called to admit the patient.  Reporting some calf tenderness with cramps  PAST MEDICAL HISTORY:   Past Medical History:  Diagnosis Date  . Diverticulitis     PAST SURGICAL HISTOIRY:   Past Surgical History:  Procedure Laterality Date  . CHOLECYSTECTOMY    . COLON RESECTION      SOCIAL HISTORY:   Social History   Tobacco Use  . Smoking status: Never Smoker  . Smokeless tobacco: Never Used  Substance Use Topics  . Alcohol use: Yes    FAMILY HISTORY:  No family history on file.  DRUG ALLERGIES:   Allergies  Allergen Reactions  . Penicillins Rash    REVIEW OF SYSTEMS:  CONSTITUTIONAL: No fever, fatigue or weakness.  EYES: No blurred or double vision.  EARS, NOSE, AND THROAT: No tinnitus or ear pain.  RESPIRATORY: Intermittent dry cough, shortness of breath for 1 week, denies wheezing or hemoptysis.  CARDIOVASCULAR: No chest pain, orthopnea, edema.  GASTROINTESTINAL: No nausea, vomiting, diarrhea or abdominal pain.  GENITOURINARY: No dysuria, hematuria.  ENDOCRINE: No  polyuria, nocturia,  HEMATOLOGY: No anemia, easy bruising or bleeding SKIN: No rash or lesion. MUSCULOSKELETAL: No joint pain or arthritis.   NEUROLOGIC: No tingling, numbness, weakness.  PSYCHIATRY: No anxiety or depression.   MEDICATIONS AT HOME:   Prior to Admission medications   Not on File      VITAL SIGNS:  Blood pressure (!) 124/91, pulse (!) 112, temperature 98.8 F (37.1 C), temperature source Oral, height 6' (1.829 m), weight 99.8 kg, SpO2 96 %.  PHYSICAL EXAMINATION:  GENERAL:  58 y.o.-year-old patient lying in the bed with no acute distress.  EYES: Pupils equal, round, reactive to light and accommodation. No scleral icterus. Extraocular muscles intact.  HEENT: Head atraumatic, normocephalic. Oropharynx and nasopharynx clear.  NECK:  Supple, no jugular venous distention. No thyroid enlargement, no tenderness.  LUNGS: Normal breath sounds bilaterally, no wheezing, rales,rhonchi or crepitation. No use of accessory muscles of respiration.  CARDIOVASCULAR: S1, S2 normal. No murmurs, rubs, or gallops.  ABDOMEN: Soft, nontender, nondistended. Bowel sounds present.   EXTREMITIES: Bilateral calf tenderness is present no pedal edema, cyanosis, or clubbing.  NEUROLOGIC: Cranial nerves II through XII are intact. Muscle strength 5/5 in all extremities. Sensation intact. Gait not checked.  PSYCHIATRIC: The patient is alert and oriented x 3.  SKIN: No obvious rash, lesion, or ulcer.   LABORATORY PANEL:   CBC Recent Labs  Lab 01/29/19 1043  WBC 11.1*  HGB 19.2*  HCT 54.8*  PLT 220   ------------------------------------------------------------------------------------------------------------------  Chemistries  Recent Labs  Lab 01/29/19 1043  NA 134*  K 4.6  CL 100  CO2 21*  GLUCOSE 313*  BUN 15  CREATININE 1.10  CALCIUM 9.5   ------------------------------------------------------------------------------------------------------------------  Cardiac Enzymes No  results for input(s): TROPONINI in the last 168 hours. ------------------------------------------------------------------------------------------------------------------  RADIOLOGY:  Dg Chest 2 View  Result Date: 01/29/2019 CLINICAL DATA:  Shortness of breath. EXAM: CHEST - 2 VIEW COMPARISON:  None. FINDINGS: The cardiomediastinal silhouette is within normal limits. The lungs are hypoinflated with mild-to-moderate elevation of the right hemidiaphragm. No airspace consolidation, edema, pleural effusion, pneumothorax is identified. There is mild anterior wedging of 2 adjacent thoracic vertebral bodies, approximately T7 and T8. IMPRESSION: 1. Hypoinflation and right hemidiaphragm elevation without evidence of pneumonia or edema. 2. Mild anterior wedging of 2 adjacent mid thoracic vertebral bodies, age indeterminate. Electronically Signed   By: Logan Bores M.D.   On: 01/29/2019 12:43   Ct Angio Chest Pe W And/or Wo Contrast  Result Date: 01/29/2019 CLINICAL DATA:  Shortness of breath and hypoxia. EXAM: CT ANGIOGRAPHY CHEST WITH CONTRAST TECHNIQUE: Multidetector CT imaging of the chest was performed using the standard protocol during bolus administration of intravenous contrast. Multiplanar CT image reconstructions and MIPs were obtained to evaluate the vascular anatomy. CONTRAST:  90mL OMNIPAQUE IOHEXOL 350 MG/ML SOLN COMPARISON:  Chest radiograph January 29, 2019 FINDINGS: Cardiovascular: Extensive pulmonary embolus arises from the proximal right main pulmonary artery extending peripherally with pulmonary embolus in multiple right upper and right middle lobe pulmonary artery branches. There is a lesser degree of pulmonary embolus extend into the right upper lobe pulmonary arteries, most notably posteriorly. There are multiple pulmonary emboli in the left lower lobe pulmonary arterial vessels. There is pulmonary embolus, incompletely obstructing in the distal most aspect of the left main pulmonary artery.  The right ventricle to left ventricle diameter ratio is 1.6, consistent with right heart strain. There is no demonstrable thoracic aortic aneurysm or dissection. The visualized great vessels appear unremarkable. There is no pericardial effusion or pericardial thickening. The main pulmonary outflow tract measures 3.5 cm in diameter. Mediastinum/Nodes: Visualized thyroid appears normal. There is no appreciable thoracic adenopathy. No esophageal lesions are evident. Lungs/Pleura: There is atelectatic change in the right lower lobe. There may be mild superimposed pneumonia in this area. Lungs elsewhere are clear. No demonstrable pulmonary infarct or pleural effusion. Upper Abdomen: There is diffuse soft tissue fullness involving and surrounding the pancreas. This soft tissue fullness extends laterally on the left to the splenic hilum. There is concern for adenopathy in this region. Gallbladder absent. Visualized upper abdominal structures otherwise appear normal. Musculoskeletal: There is slight anterior wedging of the T8 vertebral body. No blastic or lytic bone lesions. No chest wall lesions evident. Review of the MIP images confirms the above findings. IMPRESSION: 1. Extensive pulmonary embolus bilaterally, somewhat more severe on the right than on the left. Positive for acute PE with CT evidence of right heart strain (RV/LV Ratio = 1.6) consistent with at least submassive (intermediate risk) PE. The presence of right heart strain has been associated with an increased risk of morbidity and mortality. Please activate Code PE by paging [phone]. 2. Enlargement of the main pulmonary outflow tract, a finding indicative of underlying pulmonary arterial hypertension. 3. Incomplete visualization of the pancreas/peripancreatic region. There is diffuse soft tissue fullness involving and potentially surrounding the pancreas. May be adenopathy in this area. Dedicated CT or MR of the abdomen with particular attention the  pancreatic region advised within  patient able based on treatment for pulmonary embolus with right heart strain acutely. 4. Atelectasis right lower lobe with questionable mild superimposed pneumonia. 5.  No adenopathy evident in the thoracic region. 6.  Gallbladder absent. Critical Value/emergent results were called by telephone at the time of interpretation on 01/29/2019 at 3:03 pm to [place] , who verbally acknowledged these results. Electronically Signed   By: Lowella Grip III M.D.   On: 01/29/2019 15:04    EKG:   Orders placed or performed during the hospital encounter of 01/29/19  . EKG 12-Lead  . EKG 12-Lead  . ED EKG  . ED EKG    IMPRESSION AND PLAN:   #Acute hypoxic respiratory failure from massive bilateral pulmonary embolism Admit to telemetry Patient is started on heparin drip Stat bilateral lower extremity venous Dopplers ordered Stat echocardiogram as the CT scan is revealing right heart strain Stat consult placed to vascular surgery Dr. dew he is aware of the consult considering thrombectomy today as patient is tachycardic and hypoxic  #Polycythemia-unclear etiology Hemoglobin 19.2 We will consult oncology Dr. Tasia Catchings  #Leukocytosis probably inflammatory/reactive No fever We will continue close monitoring and not considering antibiotics at this time  #Sinus tachycardia-could be from right heart strain from underlying pulmonary embolism Hydrate with IV fluids and monitor on telemetry  DVT prophylaxis on heparin drip  All the records are reviewed and case discussed with ED provider. Management plans discussed with the patient, family and they are in agreement.  CODE STATUS: FC   TOTAL TIME TAKING CARE OF THIS PATIENT: 45  minutes.   Note: This dictation was prepared with Dragon dictation along with smaller phrase technology. Any transcriptional errors that result from this process are unintentional.  Nicholes Mango M.D on 01/29/2019 at 4:07  PM  Between 7am to 6pm - Pager - [phone]  After 6pm go to [email or web address] - password [email or web address] [facility]  Office  [phone]  CC: Primary care physician; Rusty Aus, MD

## 2019-01-30 ENCOUNTER — Inpatient Hospital Stay: Payer: BC Managed Care – PPO

## 2019-01-30 ENCOUNTER — Encounter: Payer: Self-pay | Admitting: Radiology

## 2019-01-30 ENCOUNTER — Other Ambulatory Visit (INDEPENDENT_AMBULATORY_CARE_PROVIDER_SITE_OTHER): Payer: Self-pay | Admitting: Vascular Surgery

## 2019-01-30 LAB — CBC WITH DIFFERENTIAL/PLATELET
Abs Immature Granulocytes: 0.07 10*3/uL (ref 0.00–0.07)
Basophils Absolute: 0.1 10*3/uL (ref 0.0–0.1)
Basophils Relative: 1 %
Eosinophils Absolute: 0.2 10*3/uL (ref 0.0–0.5)
Eosinophils Relative: 3 %
HCT: 41.2 % (ref 39.0–52.0)
Hemoglobin: 14.1 g/dL (ref 13.0–17.0)
Immature Granulocytes: 1 %
Lymphocytes Relative: 25 %
Lymphs Abs: 2 10*3/uL (ref 0.7–4.0)
MCH: 29.9 pg (ref 26.0–34.0)
MCHC: 34.2 g/dL (ref 30.0–36.0)
MCV: 87.5 fL (ref 80.0–100.0)
Monocytes Absolute: 0.8 10*3/uL (ref 0.1–1.0)
Monocytes Relative: 10 %
Neutro Abs: 4.9 10*3/uL (ref 1.7–7.7)
Neutrophils Relative %: 60 %
Platelets: 177 10*3/uL (ref 150–400)
RBC: 4.71 MIL/uL (ref 4.22–5.81)
RDW: 12.1 % (ref 11.5–15.5)
WBC: 8 10*3/uL (ref 4.0–10.5)
nRBC: 0 % (ref 0.0–0.2)

## 2019-01-30 LAB — HEPARIN LEVEL (UNFRACTIONATED)
Heparin Unfractionated: 0.23 IU/mL — ABNORMAL LOW (ref 0.30–0.70)
Heparin Unfractionated: 0.35 IU/mL (ref 0.30–0.70)
Heparin Unfractionated: 0.41 IU/mL (ref 0.30–0.70)

## 2019-01-30 LAB — ECHOCARDIOGRAM COMPLETE
Height: 72 in
Weight: 3532.8 oz

## 2019-01-30 LAB — COMPREHENSIVE METABOLIC PANEL
ALT: 13 U/L (ref 0–44)
AST: 13 U/L — ABNORMAL LOW (ref 15–41)
Albumin: 3.3 g/dL — ABNORMAL LOW (ref 3.5–5.0)
Alkaline Phosphatase: 66 U/L (ref 38–126)
Anion gap: 10 (ref 5–15)
BUN: 12 mg/dL (ref 6–20)
CO2: 22 mmol/L (ref 22–32)
Calcium: 8.2 mg/dL — ABNORMAL LOW (ref 8.9–10.3)
Chloride: 104 mmol/L (ref 98–111)
Creatinine, Ser: 0.85 mg/dL (ref 0.61–1.24)
GFR calc Af Amer: >60 mL/min (ref >60)
GFR calc non Af Amer: >60 mL/min (ref >60)
Glucose, Bld: 226 mg/dL — ABNORMAL HIGH (ref 70–99)
Potassium: 3.8 mmol/L (ref 3.5–5.1)
Sodium: 136 mmol/L (ref 135–145)
Total Bilirubin: 0.9 mg/dL (ref 0.3–1.2)
Total Protein: 6 g/dL — ABNORMAL LOW (ref 6.5–8.1)

## 2019-01-30 LAB — HIV ANTIBODY (ROUTINE TESTING W REFLEX): HIV Screen 4th Generation wRfx: NONREACTIVE

## 2019-01-30 LAB — HEMOGLOBIN A1C
Hgb A1c MFr Bld: 10.5 % — ABNORMAL HIGH (ref 4.8–5.6)
Mean Plasma Glucose: 254.65 mg/dL

## 2019-01-30 MED ORDER — GUAIFENESIN-DM 100-10 MG/5ML PO SYRP
5.0000 mL | ORAL_SOLUTION | ORAL | Status: DC | PRN
  Administered 2019-01-30: 5 mL via ORAL
  Filled 2019-01-30: qty 5

## 2019-01-30 MED ORDER — HEPARIN BOLUS VIA INFUSION
1400.0000 [IU] | Freq: Once | INTRAVENOUS | Status: AC
  Administered 2019-01-30: 1400 [IU] via INTRAVENOUS
  Filled 2019-01-30: qty 1400

## 2019-01-30 MED ORDER — IOHEXOL 300 MG/ML  SOLN
100.0000 mL | Freq: Once | INTRAMUSCULAR | Status: AC | PRN
  Administered 2019-01-30: 100 mL via INTRAVENOUS

## 2019-01-30 NOTE — Plan of Care (Signed)
Pt s/p thrombolysis via right femoral access; PAD in place and intact.  Heparin infusing to prevent further clot formation.   Problem: Education: Goal: Knowledge of General Education information will improve Description: Including pain rating scale, medication(s)/side effects and non-pharmacologic comfort measures Outcome: Progressing   Problem: Health Behavior/Discharge Planning: Goal: Ability to manage health-related needs will improve Outcome: Progressing   Problem: Clinical Measurements: Goal: Ability to maintain clinical measurements within normal limits will improve Outcome: Progressing Goal: Will remain free from infection Outcome: Progressing Goal: Diagnostic test results will improve Outcome: Progressing   Problem: Activity: Goal: Risk for activity intolerance will decrease Outcome: Progressing   Problem: Nutrition: Goal: Adequate nutrition will be maintained Outcome: Progressing   Problem: Coping: Goal: Level of anxiety will decrease Outcome: Progressing   Problem: Elimination: Goal: Will not experience complications related to bowel motility Outcome: Progressing Goal: Will not experience complications related to urinary retention Outcome: Progressing   Problem: Pain Managment: Goal: General experience of comfort will improve Outcome: Progressing   Problem: Safety: Goal: Ability to remain free from injury will improve Outcome: Progressing   Problem: Education: Goal: Understanding of CV disease, CV risk reduction, and recovery process will improve Outcome: Progressing Goal: Individualized Educational Video(s) Outcome: Progressing   Problem: Activity: Goal: Ability to return to baseline activity level will improve Outcome: Progressing   Problem: Cardiovascular: Goal: Ability to achieve and maintain adequate cardiovascular perfusion will improve Outcome: Progressing Goal: Vascular access site(s) Level 0-1 will be maintained Outcome: Progressing    Problem: Health Behavior/Discharge Planning: Goal: Ability to safely manage health-related needs after discharge will improve Outcome: Progressing

## 2019-01-30 NOTE — Consult Note (Signed)
Hematology/Oncology Consult note [facility] Telephone:([phone] [phone]  Patient Care Team: Rusty Aus, MD as PCP - General (Internal Medicine)   Name of the patient: Edward Atkinson  [id number]  [birth year]   Date of visit: 01/30/19 REASON FOR COSULTATION:  Polycythemia and  PE History of presenting illness-  58 y.o. male with no significant past medical history who presents to ER for evaluation of shortness of breath.  Patient noticed some cough and shortness of breath for the past 1 week, progressively getting worse.  He went to [facility] today for evaluation and he was hypoxic with a pulse ox 80% on room air.  Patient denies any chest pain and he was sent over to ER for further evaluation. CT chest PE protocol reviewed bilateral massive pulmonary embolism with right heart strain and patient was started on heparin drip.  Patient was tachypneic and tachycardia.  Vascular surgeon was consulted and the patient was taken to the OR for thrombectomy and thrombolysis.  Patient also had selective catheter placement right upper lobe, middle lobe, lower lobe pulmonary arteries, left upper and lower pulmonary arteries. Ultrasound venous duplex bilateral showed DVT of the left lower extremity with nonocclusive thrombus in the left common femoral vein.  Some of the common femoral venous thrombus does extend just into the GSV across SF J.  No evidence of right lower extremity DVT. Patient denies any recent travel, immobilization factors.  He was in his usual state of health. Denies any unintentional weight loss, night sweating. CT chest PE protocol also reviewed soft tissue fullness around pancreas area. Patient did mention that his father had history of pancreatic cancer. Today he feels breathing is a lot better.  Denies any pleuritic chest pain.  Denies any lower extremity swelling.  He only felt some mild cramps of lower extremity prior to onset of symptoms.   Wife is at bedside.  Review of Systems  Constitutional: Negative for appetite change, chills, fatigue, fever and unexpected weight change.  HENT:   Negative for hearing loss and voice change.   Eyes: Negative for eye problems and icterus.  Respiratory: Positive for shortness of breath. Negative for chest tightness and cough.   Cardiovascular: Negative for chest pain and leg swelling.  Gastrointestinal: Negative for abdominal distention and abdominal pain.  Endocrine: Negative for hot flashes.  Genitourinary: Negative for difficulty urinating, dysuria and frequency.   Musculoskeletal: Negative for arthralgias.  Skin: Negative for itching and rash.  Neurological: Negative for light-headedness and numbness.  Hematological: Negative for adenopathy. Does not bruise/bleed easily.  Psychiatric/Behavioral: Negative for confusion.    Allergies  Allergen Reactions   Penicillins Rash    Patient Active Problem List   Diagnosis Date Noted   Massive pulmonary hemorrhage originating in perinatal period 01/29/2019     Past Medical History:  Diagnosis Date   Diverticulitis      Past Surgical History:  Procedure Laterality Date   CHOLECYSTECTOMY     COLON RESECTION      Social History   Socioeconomic History   Marital status: Married    Spouse name: Not on file   Number of children: Not on file   Years of education: Not on file   Highest education level: Not on file  Occupational History   Not on file  Social Needs   Financial resource strain: Not on file   Food insecurity    Worry: Not on file    Inability: Not on file   Transportation  needs    Medical: Not on file    Non-medical: Not on file  Tobacco Use   Smoking status: Never Smoker   Smokeless tobacco: Never Used  Substance and Sexual Activity   Alcohol use: Yes    Comment: occasional   Drug use: Never   Sexual activity: Not on file  Lifestyle   Physical activity    Days per week: Not on  file    Minutes per session: Not on file   Stress: Not on file  Relationships   Social connections    Talks on phone: Not on file    Gets together: Not on file    Attends religious service: Not on file    Active member of club or organization: Not on file    Attends meetings of clubs or organizations: Not on file    Relationship status: Not on file   Intimate partner violence    Fear of current or ex partner: Not on file    Emotionally abused: Not on file    Physically abused: Not on file    Forced sexual activity: Not on file  Other Topics Concern   Not on file  Social History Narrative   Not on file     No family history on file.   Current Facility-Administered Medications:    acetaminophen (TYLENOL) tablet 650 mg, 650 mg, Oral, Q6H PRN **OR** acetaminophen (TYLENOL) suppository 650 mg, 650 mg, Rectal, Q6H PRN, Lucky Cowboy, Erskine Squibb, MD   [COMPLETED] heparin bolus via infusion 6,000 Units, 6,000 Units, Intravenous, Once, 6,000 Units at 01/29/19 1603 **FOLLOWED BY** heparin ADULT infusion 100 units/mL (25000 units/247mL sodium chloride 0.45%), 1,850 Units/hr, Intravenous, Continuous, Hall, Scott A, RPH, Last Rate: 18.5 mL/hr at 01/30/19 1231, 1,850 Units/hr at 01/30/19 1231   ondansetron (ZOFRAN) tablet 4 mg, 4 mg, Oral, Q6H PRN **OR** ondansetron (ZOFRAN) injection 4 mg, 4 mg, Intravenous, Q6H PRN, Algernon Huxley, MD   Physical exam:  Vitals:   01/29/19 2146 01/30/19 0405 01/30/19 0407 01/30/19 0806  BP:  122/77  121/73  Pulse: (!) 103 98  100  Resp:    19  Temp:  98.2 F (36.8 C)  98.3 F (36.8 C)  TempSrc:      SpO2: 98% 97%  94%  Weight:   222 lb 14.4 oz (101.1 kg)   Height:       Physical Exam  Constitutional: He is oriented to person, place, and time. No distress.  HENT:  Head: Normocephalic and atraumatic.  Nose: Nose normal.  Mouth/Throat: Oropharynx is clear and moist. No oropharyngeal exudate.  Eyes: Pupils are equal, round, and reactive to light. EOM are  normal. No scleral icterus.  Neck: Normal range of motion. Neck supple.  Cardiovascular: Normal rate and regular rhythm.  No murmur heard. Pulmonary/Chest: Effort normal and breath sounds normal. No respiratory distress. He has no rales. He exhibits no tenderness.  Abdominal: Soft. He exhibits no distension. There is no abdominal tenderness.  Musculoskeletal: Normal range of motion.        General: No edema.  Neurological: He is alert and oriented to person, place, and time. No cranial nerve deficit. He exhibits normal muscle tone. Coordination normal.  Skin: Skin is warm and dry. He is not diaphoretic. No erythema.  Psychiatric: Affect normal.        CMP Latest Ref Rng & Units 01/30/2019  Glucose 70 - 99 mg/dL 226(H)  BUN 6 - 20 mg/dL 12  Creatinine 0.61 - 1.24 mg/dL  0.85  Sodium 135 - 145 mmol/L 136  Potassium 3.5 - 5.1 mmol/L 3.8  Chloride 98 - 111 mmol/L 104  CO2 22 - 32 mmol/L 22  Calcium 8.9 - 10.3 mg/dL 8.2(L)  Total Protein 6.5 - 8.1 g/dL 6.0(L)  Total Bilirubin 0.3 - 1.2 mg/dL 0.9  Alkaline Phos 38 - 126 U/L 66  AST 15 - 41 U/L 13(L)  ALT 0 - 44 U/L 13   CBC Latest Ref Rng & Units 01/30/2019  WBC 4.0 - 10.5 K/uL 8.0  Hemoglobin 13.0 - 17.0 g/dL 14.1  Hematocrit 39.0 - 52.0 % 41.2  Platelets 150 - 400 K/uL 177   RADIOGRAPHIC STUDIES: I have personally reviewed the radiological images as listed and agreed with the findings in the report.  Dg Chest 2 View  Result Date: 01/29/2019 CLINICAL DATA:  Shortness of breath. EXAM: CHEST - 2 VIEW COMPARISON:  None. FINDINGS: The cardiomediastinal silhouette is within normal limits. The lungs are hypoinflated with mild-to-moderate elevation of the right hemidiaphragm. No airspace consolidation, edema, pleural effusion, pneumothorax is identified. There is mild anterior wedging of 2 adjacent thoracic vertebral bodies, approximately T7 and T8. IMPRESSION: 1. Hypoinflation and right hemidiaphragm elevation without evidence of  pneumonia or edema. 2. Mild anterior wedging of 2 adjacent mid thoracic vertebral bodies, age indeterminate. Electronically Signed   By: Logan Bores M.D.   On: 01/29/2019 12:43   Ct Angio Chest Pe W And/or Wo Contrast  Result Date: 01/29/2019 CLINICAL DATA:  Shortness of breath and hypoxia. EXAM: CT ANGIOGRAPHY CHEST WITH CONTRAST TECHNIQUE: Multidetector CT imaging of the chest was performed using the standard protocol during bolus administration of intravenous contrast. Multiplanar CT image reconstructions and MIPs were obtained to evaluate the vascular anatomy. CONTRAST:  22mL OMNIPAQUE IOHEXOL 350 MG/ML SOLN COMPARISON:  Chest radiograph January 29, 2019 FINDINGS: Cardiovascular: Extensive pulmonary embolus arises from the proximal right main pulmonary artery extending peripherally with pulmonary embolus in multiple right upper and right middle lobe pulmonary artery branches. There is a lesser degree of pulmonary embolus extend into the right upper lobe pulmonary arteries, most notably posteriorly. There are multiple pulmonary emboli in the left lower lobe pulmonary arterial vessels. There is pulmonary embolus, incompletely obstructing in the distal most aspect of the left main pulmonary artery. The right ventricle to left ventricle diameter ratio is 1.6, consistent with right heart strain. There is no demonstrable thoracic aortic aneurysm or dissection. The visualized great vessels appear unremarkable. There is no pericardial effusion or pericardial thickening. The main pulmonary outflow tract measures 3.5 cm in diameter. Mediastinum/Nodes: Visualized thyroid appears normal. There is no appreciable thoracic adenopathy. No esophageal lesions are evident. Lungs/Pleura: There is atelectatic change in the right lower lobe. There may be mild superimposed pneumonia in this area. Lungs elsewhere are clear. No demonstrable pulmonary infarct or pleural effusion. Upper Abdomen: There is diffuse soft tissue  fullness involving and surrounding the pancreas. This soft tissue fullness extends laterally on the left to the splenic hilum. There is concern for adenopathy in this region. Gallbladder absent. Visualized upper abdominal structures otherwise appear normal. Musculoskeletal: There is slight anterior wedging of the T8 vertebral body. No blastic or lytic bone lesions. No chest wall lesions evident. Review of the MIP images confirms the above findings. IMPRESSION: 1. Extensive pulmonary embolus bilaterally, somewhat more severe on the right than on the left. Positive for acute PE with CT evidence of right heart strain (RV/LV Ratio = 1.6) consistent with at least submassive (intermediate risk) PE.  The presence of right heart strain has been associated with an increased risk of morbidity and mortality. Please activate Code PE by paging [phone]. 2. Enlargement of the main pulmonary outflow tract, a finding indicative of underlying pulmonary arterial hypertension. 3. Incomplete visualization of the pancreas/peripancreatic region. There is diffuse soft tissue fullness involving and potentially surrounding the pancreas. May be adenopathy in this area. Dedicated CT or MR of the abdomen with particular attention the pancreatic region advised within patient able based on treatment for pulmonary embolus with right heart strain acutely. 4. Atelectasis right lower lobe with questionable mild superimposed pneumonia. 5.  No adenopathy evident in the thoracic region. 6.  Gallbladder absent. Critical Value/emergent results were called by telephone at the time of interpretation on 01/29/2019 at 3:03 pm to [place] , who verbally acknowledged these results. Electronically Signed   By: Lowella Grip III M.D.   On: 01/29/2019 15:04   US Venous Img Lower Bilateral  Result Date: 01/30/2019 CLINICAL DATA:  Acute bilateral pulmonary embolism. EXAM: BILATERAL LOWER EXTREMITY VENOUS DOPPLER ULTRASOUND TECHNIQUE:  Gray-scale sonography with graded compression, as well as color Doppler and duplex ultrasound were performed to evaluate the lower extremity deep venous systems from the level of the common femoral vein and including the common femoral, femoral, profunda femoral, popliteal and calf veins including the posterior tibial, peroneal and gastrocnemius veins when visible. The superficial great saphenous vein was also interrogated. Spectral Doppler was utilized to evaluate flow at rest and with distal augmentation maneuvers in the common femoral, femoral and popliteal veins. COMPARISON:  None. FINDINGS: RIGHT LOWER EXTREMITY Common Femoral Vein: No evidence of thrombus. Normal compressibility, respiratory phasicity and response to augmentation. Saphenofemoral Junction: No evidence of thrombus. Normal compressibility and flow on color Doppler imaging. Profunda Femoral Vein: No evidence of thrombus. Normal compressibility and flow on color Doppler imaging. Femoral Vein: No evidence of thrombus. Normal compressibility, respiratory phasicity and response to augmentation. Popliteal Vein: No evidence of thrombus. Normal compressibility, respiratory phasicity and response to augmentation. Calf Veins: No evidence of thrombus. Normal compressibility and flow on color Doppler imaging. Superficial Great Saphenous Vein: No evidence of thrombus. Normal compressibility. Venous Reflux:  None. Other Findings: No evidence of superficial thrombophlebitis or abnormal fluid collection. LEFT LOWER EXTREMITY Common Femoral Vein: Acute thrombus in the left common femoral vein occupies much of the lumen but is not completely occlusive with flow present. Saphenofemoral Junction: Some of the common femoral venous clot does extend into the saphenofemoral junction and GSV. Profunda Femoral Vein: No evidence of thrombus. Normal compressibility and flow on color Doppler imaging. Femoral Vein: Acute thrombus in the proximal to mid femoral vein of the  thigh without complete occlusion. Popliteal Vein: No evidence of thrombus. Normal compressibility, respiratory phasicity and response to augmentation. Calf Veins: No evidence of thrombus. Normal compressibility and flow on color Doppler imaging. Superficial Great Saphenous Vein: Upper thigh segment of the GSV contains some thrombus at the level of the saphenofemoral junction. Venous Reflux:  None. Other Findings:  None. IMPRESSION: 1. DVT of the left lower extremity with nonocclusive thrombus in the left common femoral vein and femoral vein. Some of the common femoral venous thrombus does extend just into the GSV across the SFJ. 2. No evidence of right lower extremity DVT. Electronically Signed   By: Aletta Edouard M.D.   On: 01/30/2019 09:52    Assessment and plan- Patient is a 58 y.o. male presented for evaluation of shortness of breath. CT chest PE protocol showed  bilateral massive PE with right heart strain. Bilateral lower extremity ultrasound venous duplex showed proximal left lower extremity DVT.  #Acute bilateral massive pulmonary embolism, acute left lower extremity proximal DVT Unprovoked. Status post thrombectomy/thrombolysis by vascular surgery. Agree with heparin GTT for anticoagulation. Clinically patient is hemodynamically stable today and his symptoms have significantly improved. Recommend continue another 24 to 48 hours heparin gtt, if patient continues to be stable, recommend switch to Eliquis 10 mg twice daily x 7 days, followed by Eliquis 5 mg twice daily.  Patient will need long-term anticoagulation. I will do hyper coagulable work-up outpatient.  #Polycythemia with initial hemoglobin of 19.2. Today's repeat CBC showed hemoglobin has decreased to 14.1. It is possible that hemoglobin has decreased due to acute blood loss during procedure or decreased secondary to hemodilution. I have obtained Jak 2 mutation with reflex, erythropoietin level, carbon monoxide level for  erythrocytosis work-up.  #Patient's CT chest PE angiogram showed incomplete visualization of the pancreas/peripancreatic region.  There is diffuse soft tissue fullness involving and potentially surrounding the pancreas.  Questionable lymphadenopathy in this area. Recommend to obtain CT pancreas protocol for evaluation. Patient does have family history of father was diagnosed with pancreatic cancer.  We spent sufficient time to discuss many aspect of care, questions were answered to patient and wife's satisfaction. Plan was discussed with Dr.Patel via secure chat.  Thank you for allowing me to participate in the care of this patient.  Total face to face encounter time for this patient visit was 70 min. >50% of the time was  spent in counseling and coordination of care.    Earlie Server, MD, PhD Hematology Oncology [facility] at [facility] Pager- [phone] 01/30/2019

## 2019-01-30 NOTE — Progress Notes (Signed)
[facility] Daily Progress Note  Subjective: 1 Day Post-Op:             1.  Contrast injection right heart             2.  Thrombolysis  Right and left pulmonary arteries             3.  Mechanical thrombectomy to the right upper lobe, middle lobe, and lower lobe pulmonary arteries as well as the main right pulmonary artery and to the left upper lobe and left lower lobe pulmonary arteries             4.  Selective catheter placement right upper lobe, middle lobe, and lower lobe pulmonary arteries             5.  Selective catheter placement left upper lobe and lower lobe pulmonary arteries  Objective: Vitals:   01/29/19 2146 01/30/19 0405 01/30/19 0407 01/30/19 0806  BP:  122/77  121/73  Pulse: (!) 103 98  100  Resp:    19  Temp:  98.2 F (36.8 C)  98.3 F (36.8 C)  TempSrc:      SpO2: 98% 97%  94%  Weight:   101.1 kg   Height:        Intake/Output Summary (Last 24 hours) at 01/30/2019 1306 Last data filed at 01/30/2019 1231 Gross per 24 hour  Intake 2304.65 ml  Output 2045 ml  Net 259.65 ml   Physical Exam: A&Ox3, NAD CV: RRR Pulmonary: CTA Bilaterally, Non-labored Abdomen: Soft, Nontender, Nondistended Right Groin: PAD in place. No swelling or drainage.  Vascular:  Left Lower Extremity: Thigh soft. Calf soft. Extremity warm distally to toes. Pedal pulses intact. Minimal swelling. Non-tender to palpation.      Document Information Photos  s/p Pulmonary Thrombectomy  01/29/2019 18:11  Attached To:  Hospital Encounter on 01/29/19  Source Information Leonides Schanz  [facility]    Laboratory: CBC    Component Value Date/Time   WBC 8.0 01/30/2019 0435   HGB 14.1 01/30/2019 0435   HCT 41.2 01/30/2019 0435   PLT 177 01/30/2019 0435   BMET    Component Value Date/Time   NA 136 01/30/2019 0435   K 3.8 01/30/2019 0435   CL 104 01/30/2019 0435   CO2 22 01/30/2019 0435   GLUCOSE 226 (H) 01/30/2019 0435   BUN 12 01/30/2019  0435   CREATININE 0.85 01/30/2019 0435   CALCIUM 8.2 (L) 01/30/2019 0435   GFRNONAA >60 01/30/2019 0435   GFRAA >60 01/30/2019 0435   Assessment/Planning: The patient is a 58 year old male with no real significant past medical history who presented to the [facility] with progressively worsening shortness of breath found to have left lower extremity DVT and PE s/p pulmonary thrombolysis POD#1 1) Bilateral pulmonary emboli s/p pulmonary thrombectomy - with improved dyspnea this afternoon.  Off of supplemental oxygen.  Improved tachycardia. On heparin gtt 2) left lower extremity DVT on venous duplex.  At this time, the patient is asymptomatic.  Denies any left lower extremity pain or swelling.  On physical exam there is minimal edema.  Nontender to palpation.  There is no acute vascular compromise to left lower extremity at this time.  Do not recommend any lysis.  Continue heparin at this time 3) we will transition to Eliquis 5 mg p.o. twice daily tomorrow.  Patient understands that we will follow him for his left lower extremity DVT in the  outpatient setting.   4) would recommend out of bed with ambulation and monitoring patient's O2 sat  Discussed with Dr. Ellis Parents [name] PA-C 01/30/2019 1:06 PM

## 2019-01-30 NOTE — Evaluation (Signed)
Physical Therapy Evaluation Patient Details Name: Edward Atkinson MRN: [id number] DOB: 02-17-1961 Today's Date: 01/30/2019   History of Present Illness  Per MD: Pt is a 58 y.o. male with no past medical history, no recent travel or broken bones is presenting to the ED with a chief complaint of shortness of breath.  Patient has been having cough for the past 1 week associated with shortness of breath, at clinic pt was hypoxemic with pulse ox at around 80% on room air.  CT angiogram has revealed bilateral massive pulmonary embolism with right heart strain, and reporting some calf tenderness with cramps. S/P pulmonary thrombectomy - with improved dyspnea this afternoon.  Clinical Impression  Pt presented with no deficits in strength, transfers, mobility, gait, balance, or activity tolerance. Pt reported no pain, and vitals and rating of SOB on a 10-point scale were monitored throughout the session: at rest HR 100, SpO2 95, SOB 0/10; after a short walk (30 ft) HR 115, SpO2 95, SOB 2/10; after medium walk (100 ft) HR 118, SpO2 93, SOB 3/10; after long walk (200 ft) HR 120, SpO2 94, SOB 2/10. Pt has returned to PLOF and does not require continued PT services at this time. Will reassess if deemed medically necessary upon receipt of new PT orders.      Follow Up Recommendations No PT follow up    Equipment Recommendations  None recommended by PT    Recommendations for Other Services       Precautions / Restrictions Precautions Precautions: Fall Restrictions Weight Bearing Restrictions: No      Mobility  Bed Mobility Overal bed mobility: Modified Independent                Transfers Overall transfer level: Needs assistance   Transfers: Sit to/from Stand Sit to Stand: Min guard         General transfer comment: CGA and min cuing for safety/equipment  Ambulation/Gait Ambulation/Gait assistance: Min guard;Independent Gait Distance (Feet): 350 Feet Assistive device: None Gait  Pattern/deviations: WFL(Within Functional Limits) Gait velocity: WFL   General Gait Details: Pt was assessed for ambulation with monitoring of vitals, and progressed from CGA for safety/equipment to Ind by end of session.  Stairs            Wheelchair Mobility    Modified Rankin (Stroke Patients Only)       Balance Overall balance assessment: Mild deficits observed, not formally tested                                           Pertinent Vitals/Pain Pain Assessment: No/denies pain    Home Living Family/patient expects to be discharged to:: Private residence Living Arrangements: Spouse/significant other;Children Available Help at Discharge: Family;Available 24 hours/day Type of Home: House Home Access: Stairs to enter Entrance Stairs-Rails: None Entrance Stairs-Number of Steps: 2 Home Layout: One level Home Equipment: None      Prior Function Level of Independence: Independent         Comments: Pt could complete ADLs and IADLs Ind including community ambulation and working full-time as a Conservator, museum/gallery        Extremity/Trunk Assessment   Upper Extremity Assessment Upper Extremity Assessment: Overall WFL for tasks assessed    Lower Extremity Assessment Lower Extremity Assessment: Overall WFL for tasks assessed    Cervical / Trunk Assessment Cervical /  Trunk Assessment: Normal  Communication   Communication: No difficulties  Cognition Arousal/Alertness: Awake/alert Behavior During Therapy: WFL for tasks assessed/performed Overall Cognitive Status: Within Functional Limits for tasks assessed                                        General Comments      Exercises Total Joint Exercises Ankle Circles/Pumps: AROM;Strengthening;Both;10 reps;15 reps Quad Sets: AROM;Strengthening;Both;10 reps;15 reps Hip ABduction/ADduction: AROM;Strengthening;Both;10 reps;15 reps Long Arc Quad:  AROM;Strengthening;Both;10 reps;15 reps Marching in Standing: AROM;Strengthening;Both;10 reps;15 reps   Assessment/Plan    PT Assessment Patent does not need any further PT services  PT Problem List         PT Treatment Interventions      PT Goals (Current goals can be found in the Care Plan section)  Acute Rehab PT Goals PT Goal Formulation: All assessment and education complete, DC therapy    Frequency     Barriers to discharge        Co-evaluation               AM-PAC PT "6 Clicks" Mobility  Outcome Measure Help needed turning from your back to your side while in a flat bed without using bedrails?: None Help needed moving from lying on your back to sitting on the side of a flat bed without using bedrails?: None Help needed moving to and from a bed to a chair (including a wheelchair)?: None Help needed standing up from a chair using your arms (e.g., wheelchair or bedside chair)?: None Help needed to walk in hospital room?: None Help needed climbing 3-5 steps with a railing? : None 6 Click Score: 24    End of Session Equipment Utilized During Treatment: Gait belt Activity Tolerance: Patient tolerated treatment well Patient left: in chair;with call bell/phone within reach;with chair alarm set;with family/visitor present Nurse Communication: Mobility status PT Visit Diagnosis: Difficulty in walking, not elsewhere classified (R26.2)    Time: [id number] PT Time Calculation (min) (ACUTE ONLY): 33 min   Charges:         Juanda Crumble "Gus" Jeannette Corpus, SPT  01/30/19, 3:43 PM

## 2019-01-30 NOTE — Progress Notes (Signed)
[place] [facility] [place] NAME: Edward Atkinson    MR#:  [id number]  DATE OF BIRTH:  30-May-1960  SUBJECTIVE:  patient came in after significant shortness of breath. He was found to have bilateral massive PE and left lower extremity DVT. Patient currently is doing well. Wife is at bedside. Denies any complaints.  He reports cramping of his legs both lower extremity for one month. Denies any cramping at present.  REVIEW OF SYSTEMS:   Review of Systems  Constitutional: Negative for chills, fever and weight loss.  HENT: Negative for ear discharge, ear pain and nosebleeds.   Eyes: Negative for blurred vision, pain and discharge.  Respiratory: Negative for sputum production, shortness of breath, wheezing and stridor.   Cardiovascular: Negative for chest pain, palpitations, orthopnea and PND.  Gastrointestinal: Negative for abdominal pain, diarrhea, nausea and vomiting.  Genitourinary: Negative for frequency and urgency.  Musculoskeletal: Negative for back pain and joint pain.  Neurological: Negative for sensory change, speech change, focal weakness and weakness.  Psychiatric/Behavioral: Negative for depression and hallucinations. The patient is not nervous/anxious.    Tolerating Diet: yes Tolerating PT: not needed  DRUG ALLERGIES:   Allergies  Allergen Reactions  . Penicillins Rash    VITALS:  Blood pressure 121/73, pulse 100, temperature 98.3 F (36.8 C), resp. rate 19, height 6' (1.829 m), weight 101.1 kg, SpO2 94 %.  PHYSICAL EXAMINATION:   Physical Exam  GENERAL:  58 y.o.-year-old patient lying in the bed with no acute distress.  EYES: Pupils equal, round, reactive to light and accommodation. No scleral icterus. Extraocular muscles intact.  HEENT: Head atraumatic, normocephalic. Oropharynx and nasopharynx clear.  NECK:  Supple, no jugular venous distention. No thyroid enlargement, no tenderness.  LUNGS: Normal breath sounds  bilaterally, no wheezing, rales, rhonchi. No use of accessory muscles of respiration.  CARDIOVASCULAR: S1, S2 normal. No murmurs, rubs, or gallops.  ABDOMEN: Soft, nontender, nondistended. Bowel sounds present. No organomegaly or mass.  EXTREMITIES: No cyanosis, clubbing or edema b/l.    NEUROLOGIC: Cranial nerves II through XII are intact. No focal Motor or sensory deficits b/l.   PSYCHIATRIC:  patient is alert and oriented x 3.  SKIN: No obvious rash, lesion, or ulcer.   LABORATORY PANEL:  CBC Recent Labs  Lab 01/30/19 0435  WBC 8.0  HGB 14.1  HCT 41.2  PLT 177    Chemistries  Recent Labs  Lab 01/30/19 0435  NA 136  K 3.8  CL 104  CO2 22  GLUCOSE 226*  BUN 12  CREATININE 0.85  CALCIUM 8.2*  AST 13*  ALT 13  ALKPHOS 66  BILITOT 0.9   Cardiac Enzymes No results for input(s): TROPONINI in the last 168 hours. RADIOLOGY:  Dg Chest 2 View  Result Date: 01/29/2019 CLINICAL DATA:  Shortness of breath. EXAM: CHEST - 2 VIEW COMPARISON:  None. FINDINGS: The cardiomediastinal silhouette is within normal limits. The lungs are hypoinflated with mild-to-moderate elevation of the right hemidiaphragm. No airspace consolidation, edema, pleural effusion, pneumothorax is identified. There is mild anterior wedging of 2 adjacent thoracic vertebral bodies, approximately T7 and T8. IMPRESSION: 1. Hypoinflation and right hemidiaphragm elevation without evidence of pneumonia or edema. 2. Mild anterior wedging of 2 adjacent mid thoracic vertebral bodies, age indeterminate. Electronically Signed   By: Logan Bores M.D.   On: 01/29/2019 12:43   Ct Angio Chest Pe W And/or Wo Contrast  Result Date: 01/29/2019 CLINICAL DATA:  Shortness of breath and  hypoxia. EXAM: CT ANGIOGRAPHY CHEST WITH CONTRAST TECHNIQUE: Multidetector CT imaging of the chest was performed using the standard protocol during bolus administration of intravenous contrast. Multiplanar CT image reconstructions and MIPs were obtained to  evaluate the vascular anatomy. CONTRAST:  52mL OMNIPAQUE IOHEXOL 350 MG/ML SOLN COMPARISON:  Chest radiograph January 29, 2019 FINDINGS: Cardiovascular: Extensive pulmonary embolus arises from the proximal right main pulmonary artery extending peripherally with pulmonary embolus in multiple right upper and right middle lobe pulmonary artery branches. There is a lesser degree of pulmonary embolus extend into the right upper lobe pulmonary arteries, most notably posteriorly. There are multiple pulmonary emboli in the left lower lobe pulmonary arterial vessels. There is pulmonary embolus, incompletely obstructing in the distal most aspect of the left main pulmonary artery. The right ventricle to left ventricle diameter ratio is 1.6, consistent with right heart strain. There is no demonstrable thoracic aortic aneurysm or dissection. The visualized great vessels appear unremarkable. There is no pericardial effusion or pericardial thickening. The main pulmonary outflow tract measures 3.5 cm in diameter. Mediastinum/Nodes: Visualized thyroid appears normal. There is no appreciable thoracic adenopathy. No esophageal lesions are evident. Lungs/Pleura: There is atelectatic change in the right lower lobe. There may be mild superimposed pneumonia in this area. Lungs elsewhere are clear. No demonstrable pulmonary infarct or pleural effusion. Upper Abdomen: There is diffuse soft tissue fullness involving and surrounding the pancreas. This soft tissue fullness extends laterally on the left to the splenic hilum. There is concern for adenopathy in this region. Gallbladder absent. Visualized upper abdominal structures otherwise appear normal. Musculoskeletal: There is slight anterior wedging of the T8 vertebral body. No blastic or lytic bone lesions. No chest wall lesions evident. Review of the MIP images confirms the above findings. IMPRESSION: 1. Extensive pulmonary embolus bilaterally, somewhat more severe on the right than on  the left. Positive for acute PE with CT evidence of right heart strain (RV/LV Ratio = 1.6) consistent with at least submassive (intermediate risk) PE. The presence of right heart strain has been associated with an increased risk of morbidity and mortality. Please activate Code PE by paging [phone]. 2. Enlargement of the main pulmonary outflow tract, a finding indicative of underlying pulmonary arterial hypertension. 3. Incomplete visualization of the pancreas/peripancreatic region. There is diffuse soft tissue fullness involving and potentially surrounding the pancreas. May be adenopathy in this area. Dedicated CT or MR of the abdomen with particular attention the pancreatic region advised within patient able based on treatment for pulmonary embolus with right heart strain acutely. 4. Atelectasis right lower lobe with questionable mild superimposed pneumonia. 5.  No adenopathy evident in the thoracic region. 6.  Gallbladder absent. Critical Value/emergent results were called by telephone at the time of interpretation on 01/29/2019 at 3:03 pm to Martinez , who verbally acknowledged these results. Electronically Signed   By: Lowella Grip III M.D.   On: 01/29/2019 15:04   US Venous Img Lower Bilateral  Result Date: 01/30/2019 CLINICAL DATA:  Acute bilateral pulmonary embolism. EXAM: BILATERAL LOWER EXTREMITY VENOUS DOPPLER ULTRASOUND TECHNIQUE: Gray-scale sonography with graded compression, as well as color Doppler and duplex ultrasound were performed to evaluate the lower extremity deep venous systems from the level of the common femoral vein and including the common femoral, femoral, profunda femoral, popliteal and calf veins including the posterior tibial, peroneal and gastrocnemius veins when visible. The superficial great saphenous vein was also interrogated. Spectral Doppler was utilized to evaluate flow at rest and with distal augmentation maneuvers in  the common femoral, femoral  and popliteal veins. COMPARISON:  None. FINDINGS: RIGHT LOWER EXTREMITY Common Femoral Vein: No evidence of thrombus. Normal compressibility, respiratory phasicity and response to augmentation. Saphenofemoral Junction: No evidence of thrombus. Normal compressibility and flow on color Doppler imaging. Profunda Femoral Vein: No evidence of thrombus. Normal compressibility and flow on color Doppler imaging. Femoral Vein: No evidence of thrombus. Normal compressibility, respiratory phasicity and response to augmentation. Popliteal Vein: No evidence of thrombus. Normal compressibility, respiratory phasicity and response to augmentation. Calf Veins: No evidence of thrombus. Normal compressibility and flow on color Doppler imaging. Superficial Great Saphenous Vein: No evidence of thrombus. Normal compressibility. Venous Reflux:  None. Other Findings: No evidence of superficial thrombophlebitis or abnormal fluid collection. LEFT LOWER EXTREMITY Common Femoral Vein: Acute thrombus in the left common femoral vein occupies much of the lumen but is not completely occlusive with flow present. Saphenofemoral Junction: Some of the common femoral venous clot does extend into the saphenofemoral junction and GSV. Profunda Femoral Vein: No evidence of thrombus. Normal compressibility and flow on color Doppler imaging. Femoral Vein: Acute thrombus in the proximal to mid femoral vein of the thigh without complete occlusion. Popliteal Vein: No evidence of thrombus. Normal compressibility, respiratory phasicity and response to augmentation. Calf Veins: No evidence of thrombus. Normal compressibility and flow on color Doppler imaging. Superficial Great Saphenous Vein: Upper thigh segment of the GSV contains some thrombus at the level of the saphenofemoral junction. Venous Reflux:  None. Other Findings:  None. IMPRESSION: 1. DVT of the left lower extremity with nonocclusive thrombus in the left common femoral vein and femoral vein. Some of  the common femoral venous thrombus does extend just into the GSV across the SFJ. 2. No evidence of right lower extremity DVT. Electronically Signed   By: Aletta Edouard M.D.   On: 01/30/2019 09:52   ASSESSMENT AND PLAN:  Jaquane Pensinger  is a 58 y.o. male with no past medical history, no recent travel or broken bones is presenting to the ED with a chief complaint of shortness of breath.  Patient has been having cough for the past 1 week associated with shortness of breath.  #Acute hypoxic respiratory failure from massive bilateral pulmonary embolism and left femoral vein DVT -patient is status post mechanical through back to me to the right and left pulmonary arteries by Dr. Lucky Cowboy -overall hemodynamically stable. Oxygen saturation more than 92% on room air - started on heparin drip-- continue for 48 hours transition to oral eliquis per Dr. Tasia Catchings -will get CT of the abdomen for Dr. Collie Siad recommendation---to rule out the soft tissue fullness around the pancreas that was seen on CT chest -hyper coagulopathy workup will be done by Dr. you as outpatient at a later date -ultrasound Doppler shows left femoral vein DVT -patient denies any recent travel, surgery, illness or any history of cancer in the past  #Polycythemia-unclear etiology Hemoglobin 19.2 We will consult oncology Dr. Tasia Catchings-- follow-up labs  #Leukocytosis probably inflammatory/reactive No fever  #Sinus tachycardia-could be from right heart strain from underlying pulmonary embolism -patient in sinus rhythm. Received IV fluids. Will discontinue now.  *DVT prophylaxis on heparin drip  Discussed with wife in the room  Case discussed with Care Management/Social Worker. Management plans discussed with the patient, family and they are in agreement.  CODE STATUS: full  DVT Prophylaxis: heparin  TOTAL TIME TAKING CARE OF THIS PATIENT: **30* minutes.  >50% time spent on counselling and coordination of care  POSSIBLE D/C IN **1  to 2 DAYS,  DEPENDING ON CLINICAL CONDITION.  Note: This dictation was prepared with Dragon dictation along with smaller phrase technology. Any transcriptional errors that result from this process are unintentional.  Fritzi Mandes M.D on 01/30/2019 at 2:17 PM  Between 7am to 6pm - Pager - [phone]  After 6pm go to [email or web address] - password [email or web address]  Office  [phone]  CC: Primary care physician; Rusty Aus, MDPatient ID: Edward Atkinson, male   DOB: 1960/09/09, 58 y.o.   MRN: [id number]

## 2019-01-30 NOTE — Consult Note (Signed)
Edward Atkinson for Heparin Drip Indication: pulmonary embolus  Allergies  Allergen Reactions  . Penicillins Rash    Patient Measurements: Height: 6' (182.9 cm) Weight: 222 lb 14.4 oz (101.1 kg) IBW/kg (Calculated) : 77.6 Heparin Dosing Weight: 97.8 kg  Vital Signs: Temp: 98.3 F (36.8 C) (09/30 0806) BP: 121/73 (09/30 0806) Pulse Rate: 100 (09/30 0806)  Labs: Recent Labs    01/29/19 1043 01/29/19 1540  01/29/19 2357 01/30/19 0435 01/30/19 0717 01/30/19 1342  HGB 19.2*  --   --   --  14.1  --   --   HCT 54.8*  --   --   --  41.2  --   --   PLT 220  --   --   --  177  --   --   APTT  --  30  --   --   --   --   --   LABPROT  --  14.5  --   --   --   --   --   INR  --  1.1  --   --   --   --   --   HEPARINUNFRC  --   --    < > 0.23*  --  0.41 0.35  CREATININE 1.10  --   --   --  0.85  --   --    < > = values in this interval not displayed.    Estimated Creatinine Clearance: 113.7 mL/min (by C-G formula based on SCr of 0.85 mg/dL).   Medical History: Past Medical History:  Diagnosis Date  . Diverticulitis     Medications:  No medications prior to admission.   Scheduled:  Infusions:  PRN:  Anti-infectives (From admission, onward)   Start     Dose/Rate Route Frequency Ordered Stop   01/30/19 0000  clindamycin (CLEOCIN) IVPB 300 mg    Note to Pharmacy: To be given in specials   300 mg 100 mL/hr over 30 Minutes Intravenous  Once 01/29/19 1607 01/30/19 0002      Assessment: Pharmacy has been consulted to initiate Heparin drip in 58yo patient. Upon CT Angiogram of chest, found with extensive bilateral pulmonary emboli, somewhat more severe on the right than on the left. Patient has no prior history of PTA anticoagulant use. Baseline labs have been ordered and are pending.  [id number] @2156  HL: 0.26, subtherapeutic. Called nurse to verify if there were heparin interruptions and was told there was were none. However, she was unable  to verify if it was interrupted in Cath lab. Per Mar, it was stopped at 1640 and restarted 1815.  0929 @ 2357 HL = 0.23, subtherapeutic & is trending down 0930 @ 0717 HL = 0.41, therapeutic 0930 @ 1342 HL = 0.35, therapeutic   Goal of Therapy:  Heparin level 0.3-0.7 units/ml Monitor platelets by anticoagulation protocol: Yes   Plan:  Will continue heparin infusion at 1850 units/hr Will recheck HL with AM labs.  Hgb and Platelets remain stable. Continue to monitor H&H and platelets  Gillie Crisci A Liron Eissler 01/30/2019,2:08 PM

## 2019-01-30 NOTE — Plan of Care (Signed)

## 2019-01-30 NOTE — Consult Note (Signed)
Wilkinson for Heparin Drip Indication: pulmonary embolus  Allergies  Allergen Reactions  . Penicillins Rash    Patient Measurements: Height: 6' (182.9 cm) Weight: 220 lb 12.8 oz (100.2 kg) IBW/kg (Calculated) : 77.6 Heparin Dosing Weight: 97.8 kg  Vital Signs: Temp: 98.6 F (37 C) (09/29 1954) Temp Source: Oral (09/29 1954) BP: 100/79 (09/29 1954) Pulse Rate: 103 (09/29 2146)  Labs: Recent Labs    01/29/19 1043 01/29/19 1540 01/29/19 2156 01/29/19 2357  HGB 19.2*  --   --   --   HCT 54.8*  --   --   --   PLT 220  --   --   --   APTT  --  30  --   --   LABPROT  --  14.5  --   --   INR  --  1.1  --   --   HEPARINUNFRC  --   --  0.26* 0.23*  CREATININE 1.10  --   --   --     Estimated Creatinine Clearance: 87.5 mL/min (by C-G formula based on SCr of 1.1 mg/dL).   Medical History: Past Medical History:  Diagnosis Date  . Diverticulitis     Medications:  No medications prior to admission.   Scheduled:  Infusions:  PRN:  Anti-infectives (From admission, onward)   Start     Dose/Rate Route Frequency Ordered Stop   01/30/19 0000  clindamycin (CLEOCIN) IVPB 300 mg    Note to Pharmacy: To be given in specials   300 mg 100 mL/hr over 30 Minutes Intravenous  Once 01/29/19 1607 01/30/19 0002      Assessment: Pharmacy has been consulted to initiate Heparin drip in 58yo patient. Upon CT Angiogram of chest, found with extensive bilateral pulmonary emboli, somewhat more severe on the right than on the left. Patient has no prior history of PTA anticoagulant use. Baseline labs have been ordered and are pending.  [id number] @2156  HL: 0.26, subtherapeutic. Called nurse to verify if there were heparin interruptions and was told there was were none. However, she was unable to verify if it was interrupted in Cath lab. Per Mar, it was stopped at 1640 and restarted 1815.  0929 @ 2357 HL = 0.23, subtherapeutic & is trending down  Goal of  Therapy:  Heparin level 0.3-0.7 units/ml Monitor platelets by anticoagulation protocol: Yes   Plan:  Will give Heparin bolus of 1400 units and increase heparin infusion to 1850 units/hr Check anti-Xa level in 6 hours after rate increase.   Continue to monitor H&H and platelets  [place] Crane, [place] Janus A 01/30/2019,12:50 AM

## 2019-01-30 NOTE — Consult Note (Signed)
[facility] for Heparin Drip Indication: pulmonary embolus  Allergies  Allergen Reactions  . Penicillins Rash    Patient Measurements: Height: 6' (182.9 cm) Weight: 222 lb 14.4 oz (101.1 kg) IBW/kg (Calculated) : 77.6 Heparin Dosing Weight: 97.8 kg  Vital Signs: Temp: 98.3 F (36.8 C) (09/30 0806) BP: 121/73 (09/30 0806) Pulse Rate: 100 (09/30 0806)  Labs: Recent Labs    01/29/19 1043 01/29/19 1540 01/29/19 2156 01/29/19 2357 01/30/19 0435 01/30/19 0717  HGB 19.2*  --   --   --  14.1  --   HCT 54.8*  --   --   --  41.2  --   PLT 220  --   --   --  177  --   APTT  --  30  --   --   --   --   LABPROT  --  14.5  --   --   --   --   INR  --  1.1  --   --   --   --   HEPARINUNFRC  --   --  0.26* 0.23*  --  0.41  CREATININE 1.10  --   --   --  0.85  --     Estimated Creatinine Clearance: 113.7 mL/min (by C-G formula based on SCr of 0.85 mg/dL).   Medical History: Past Medical History:  Diagnosis Date  . Diverticulitis     Medications:  No medications prior to admission.   Scheduled:  Infusions:  PRN:  Anti-infectives (From admission, onward)   Start     Dose/Rate Route Frequency Ordered Stop   01/30/19 0000  clindamycin (CLEOCIN) IVPB 300 mg    Note to Pharmacy: To be given in specials   300 mg 100 mL/hr over 30 Minutes Intravenous  Once 01/29/19 1607 01/30/19 0002      Assessment: Pharmacy has been consulted to initiate Heparin drip in 58yo patient. Upon CT Angiogram of chest, found with extensive bilateral pulmonary emboli, somewhat more severe on the right than on the left. Patient has no prior history of PTA anticoagulant use. Baseline labs have been ordered and are pending.  [id number] @2156  HL: 0.26, subtherapeutic. Called nurse to verify if there were heparin interruptions and was told there was were none. However, she was unable to verify if it was interrupted in Cath lab. Per Mar, it was stopped at 1640 and restarted  1815.  0929 @ 2357 HL = 0.23, subtherapeutic & is trending down 0930 @ 0717 HL = 0.41, therapeutic  Goal of Therapy:  Heparin level 0.3-0.7 units/ml Monitor platelets by anticoagulation protocol: Yes   Plan:  Will continue heparin infusion at 1850 units/hr Check confirmatory anti-Xa level in 6 hours.  Hgb and Platelets remain stable. Continue to monitor H&H and platelets  Dorthea Maina A Roniqua Kintz 01/30/2019,8:17 AM

## 2019-01-31 ENCOUNTER — Inpatient Hospital Stay: Payer: BC Managed Care – PPO

## 2019-01-31 ENCOUNTER — Encounter: Payer: Self-pay | Admitting: Vascular Surgery

## 2019-01-31 LAB — CBC
HCT: 40.5 % (ref 39.0–52.0)
Hemoglobin: 13.8 g/dL (ref 13.0–17.0)
MCH: 29.8 pg (ref 26.0–34.0)
MCHC: 34.1 g/dL (ref 30.0–36.0)
MCV: 87.5 fL (ref 80.0–100.0)
Platelets: 171 10*3/uL (ref 150–400)
RBC: 4.63 MIL/uL (ref 4.22–5.81)
RDW: 12.2 % (ref 11.5–15.5)
WBC: 6 10*3/uL (ref 4.0–10.5)
nRBC: 0 % (ref 0.0–0.2)

## 2019-01-31 LAB — BASIC METABOLIC PANEL
Anion gap: 10 (ref 5–15)
BUN: 9 mg/dL (ref 6–20)
CO2: 22 mmol/L (ref 22–32)
Calcium: 8.6 mg/dL — ABNORMAL LOW (ref 8.9–10.3)
Chloride: 103 mmol/L (ref 98–111)
Creatinine, Ser: 0.88 mg/dL (ref 0.61–1.24)
GFR calc Af Amer: >60 mL/min (ref >60)
GFR calc non Af Amer: >60 mL/min (ref >60)
Glucose, Bld: 211 mg/dL — ABNORMAL HIGH (ref 70–99)
Potassium: 3.8 mmol/L (ref 3.5–5.1)
Sodium: 135 mmol/L (ref 135–145)

## 2019-01-31 LAB — HEPARIN LEVEL (UNFRACTIONATED): Heparin Unfractionated: 0.35 IU/mL (ref 0.30–0.70)

## 2019-01-31 LAB — GLUCOSE, CAPILLARY
Glucose-Capillary: 183 mg/dL — ABNORMAL HIGH (ref 70–99)
Glucose-Capillary: 140 mg/dL — ABNORMAL HIGH (ref 70–99)
Glucose-Capillary: 171 mg/dL — ABNORMAL HIGH (ref 70–99)

## 2019-01-31 LAB — ERYTHROPOIETIN: Erythropoietin: 16.6 m[IU]/mL (ref 2.6–18.5)

## 2019-01-31 MED ORDER — ENOXAPARIN SODIUM 100 MG/ML ~~LOC~~ SOLN
100.0000 mg | Freq: Two times a day (BID) | SUBCUTANEOUS | Status: DC
  Administered 2019-02-01: 100 mg via SUBCUTANEOUS
  Filled 2019-01-31 (×2): qty 1

## 2019-01-31 MED ORDER — FENTANYL CITRATE (PF) 100 MCG/2ML IJ SOLN
INTRAMUSCULAR | Status: AC | PRN
  Administered 2019-01-31 (×2): 50 ug via INTRAVENOUS

## 2019-01-31 MED ORDER — LIVING WELL WITH DIABETES BOOK
Freq: Once | Status: AC
  Administered 2019-01-31: 16:00:00
  Filled 2019-01-31 (×3): qty 1

## 2019-01-31 MED ORDER — INSULIN ASPART 100 UNIT/ML ~~LOC~~ SOLN: 0.0000 [IU] | Freq: Every day | SUBCUTANEOUS | Status: DC

## 2019-01-31 MED ORDER — INSULIN ASPART 100 UNIT/ML ~~LOC~~ SOLN
0.0000 [IU] | Freq: Three times a day (TID) | SUBCUTANEOUS | Status: DC
  Administered 2019-01-31: 1 [IU] via SUBCUTANEOUS
  Administered 2019-01-31 – 2019-02-01 (×3): 2 [IU] via SUBCUTANEOUS
  Filled 2019-01-31 (×4): qty 1

## 2019-01-31 MED ORDER — FENTANYL CITRATE (PF) 100 MCG/2ML IJ SOLN
INTRAMUSCULAR | Status: AC
  Filled 2019-01-31: qty 2

## 2019-01-31 MED ORDER — GLIPIZIDE ER 10 MG PO TB24
10.0000 mg | ORAL_TABLET | Freq: Every day | ORAL | Status: DC
  Administered 2019-02-01: 10 mg via ORAL
  Filled 2019-01-31: qty 1

## 2019-01-31 MED ORDER — MIDAZOLAM HCL 5 MG/5ML IJ SOLN
INTRAMUSCULAR | Status: AC
  Filled 2019-01-31: qty 5

## 2019-01-31 MED ORDER — MIDAZOLAM HCL 2 MG/2ML IJ SOLN
INTRAMUSCULAR | Status: AC | PRN
  Administered 2019-01-31 (×2): 1 mg via INTRAVENOUS

## 2019-01-31 MED ORDER — HEPARIN (PORCINE) 25000 UT/250ML-% IV SOLN
1850.0000 [IU]/h | INTRAVENOUS | Status: AC
  Administered 2019-01-31: 1850 [IU]/h via INTRAVENOUS

## 2019-01-31 NOTE — Consult Note (Signed)
[facility] for Heparin Drip Indication: pulmonary embolus  Allergies  Allergen Reactions  . Penicillins Rash    Patient Measurements: Height: 6' (182.9 cm) Weight: 221 lb 8 oz (100.5 kg) IBW/kg (Calculated) : 77.6 Heparin Dosing Weight: 97.8 kg  Vital Signs: Temp: 98.6 F (37 C) (10/01 0431) Temp Source: Oral (10/01 0431) BP: 135/85 (10/01 0431) Pulse Rate: 94 (10/01 0431)  Labs: Recent Labs    01/29/19 1043 01/29/19 1540  01/30/19 0435 01/30/19 0717 01/30/19 1342 01/31/19 0435  HGB 19.2*  --   --  14.1  --   --  13.8  HCT 54.8*  --   --  41.2  --   --  40.5  PLT 220  --   --  177  --   --  171  APTT  --  30  --   --   --   --   --   LABPROT  --  14.5  --   --   --   --   --   INR  --  1.1  --   --   --   --   --   HEPARINUNFRC  --   --    < >  --  0.41 0.35 0.35  CREATININE 1.10  --   --  0.85  --   --  0.88   < > = values in this interval not displayed.    Estimated Creatinine Clearance: 109.6 mL/min (by C-G formula based on SCr of 0.88 mg/dL).   Medical History: Past Medical History:  Diagnosis Date  . Diverticulitis     Medications:  No medications prior to admission.   Scheduled:  Infusions:  PRN:  Anti-infectives (From admission, onward)   Start     Dose/Rate Route Frequency Ordered Stop   01/30/19 0000  clindamycin (CLEOCIN) IVPB 300 mg    Note to Pharmacy: To be given in specials   300 mg 100 mL/hr over 30 Minutes Intravenous  Once 01/29/19 1607 01/30/19 0002      Assessment: Pharmacy has been consulted to initiate Heparin drip in 58yo patient. Upon CT Angiogram of chest, found with extensive bilateral pulmonary emboli, somewhat more severe on the right than on the left. Patient has no prior history of PTA anticoagulant use. Baseline labs have been ordered and are pending.  [id number] @2156  HL: 0.26, subtherapeutic. Called nurse to verify if there were heparin interruptions and was told there was were none.  However, she was unable to verify if it was interrupted in Cath lab. Per Mar, it was stopped at 1640 and restarted 1815.  0929 @ 2357 HL = 0.23, subtherapeutic & is trending down 0930 @ 0717 HL = 0.41, therapeutic 0930 @ 1342 HL = 0.35, therapeutic 1001 @ 0435 HL = 0.35, therapeutic   Goal of Therapy:  Heparin level 0.3-0.7 units/ml Monitor platelets by anticoagulation protocol: Yes   Plan:  Will continue heparin infusion at 1850 units/hr Will recheck HL with AM labs.  Hgb and Platelets remain stable. Continue to monitor H&H and platelets  [place] Crane, Davanta [employer]user A 01/31/2019,5:45 AM

## 2019-01-31 NOTE — Consult Note (Signed)
Chief Complaint: Abdominal mass worrisome for abdominal lymphoma  Referring Physician(s): Tasia Catchings (Oncology)  Patient Status: [facility] - In-pt  History of Present Illness: Edward Atkinson is a 58 y.o. male with no significant past medical history who presented to the emergency department on 01/29/2019 with shortness of breath found to have submassive pulmonary embolism.  For this, the patient underwent catheter directed thrombectomy.  The chest CTA was also noted to demonstrate an ill-defined infiltrative mass within the abdominal mesentery which was confirmed on subsequent dedicated CT scan of the abdomen and pelvis performed 01/30/2019 demonstrating findings worrisome for abdominal lymphoma.  As such, request made for CT-guided biopsy of this mesenteric mass for tissue diagnostic purposes prior to being transitioned from a heparin drip to a long-term anticoagulant.  Patient states that shortness of breath has improved and is currently without complaint.  Past Medical History:  Diagnosis Date   Diverticulitis     Past Surgical History:  Procedure Laterality Date   CHOLECYSTECTOMY     COLON RESECTION     PULMONARY THROMBECTOMY N/A 01/29/2019   Procedure: PULMONARY THROMBECTOMY;  Surgeon: Algernon Huxley, MD;  Location: [facility];  Service: Cardiovascular;  Laterality: N/A;    Allergies: Penicillins  Medications: Prior to Admission medications   Not on File     No family history on file.  Social History   Socioeconomic History   Marital status: Married    Spouse name: Not on file   Number of children: Not on file   Years of education: Not on file   Highest education level: Not on file  Occupational History   Not on file  Social Needs   Financial resource strain: Not on file   Food insecurity    Worry: Not on file    Inability: Not on file   Transportation needs    Medical: Not on file    Non-medical: Not on file  Tobacco Use   Smoking status:  Never Smoker   Smokeless tobacco: Never Used  Substance and Sexual Activity   Alcohol use: Yes    Comment: occasional   Drug use: Never   Sexual activity: Not on file  Lifestyle   Physical activity    Days per week: Not on file    Minutes per session: Not on file   Stress: Not on file  Relationships   Social connections    Talks on phone: Not on file    Gets together: Not on file    Attends religious service: Not on file    Active member of club or organization: Not on file    Attends meetings of clubs or organizations: Not on file    Relationship status: Not on file  Other Topics Concern   Not on file  Social History Narrative   Not on file    ECOG Status: 1 - Symptomatic but completely ambulatory  Review of Systems: A 12 point ROS discussed and pertinent positives are indicated in the HPI above.  All other systems are negative.  Review of Systems  Respiratory: Positive for shortness of breath.        Subjectively improved since undergoing cath directed thrombectomy  All other systems reviewed and are negative.   Vital Signs: BP (!) 146/94 (BP Location: Right Arm)    Pulse 95    Temp 98.2 F (36.8 C)    Resp (!) 23    Ht 6' (1.829 m)    Wt 100.5 kg  SpO2 97%    BMI 30.04 kg/m   Physical Exam  Imaging: Dg Chest 2 View  Result Date: 01/29/2019 CLINICAL DATA:  Shortness of breath. EXAM: CHEST - 2 VIEW COMPARISON:  None. FINDINGS: The cardiomediastinal silhouette is within normal limits. The lungs are hypoinflated with mild-to-moderate elevation of the right hemidiaphragm. No airspace consolidation, edema, pleural effusion, pneumothorax is identified. There is mild anterior wedging of 2 adjacent thoracic vertebral bodies, approximately T7 and T8. IMPRESSION: 1. Hypoinflation and right hemidiaphragm elevation without evidence of pneumonia or edema. 2. Mild anterior wedging of 2 adjacent mid thoracic vertebral bodies, age indeterminate. Electronically Signed   By:  Logan Bores M.D.   On: 01/29/2019 12:43   Ct Angio Chest Pe W And/or Wo Contrast  Result Date: 01/29/2019 CLINICAL DATA:  Shortness of breath and hypoxia. EXAM: CT ANGIOGRAPHY CHEST WITH CONTRAST TECHNIQUE: Multidetector CT imaging of the chest was performed using the standard protocol during bolus administration of intravenous contrast. Multiplanar CT image reconstructions and MIPs were obtained to evaluate the vascular anatomy. CONTRAST:  29mL OMNIPAQUE IOHEXOL 350 MG/ML SOLN COMPARISON:  Chest radiograph January 29, 2019 FINDINGS: Cardiovascular: Extensive pulmonary embolus arises from the proximal right main pulmonary artery extending peripherally with pulmonary embolus in multiple right upper and right middle lobe pulmonary artery branches. There is a lesser degree of pulmonary embolus extend into the right upper lobe pulmonary arteries, most notably posteriorly. There are multiple pulmonary emboli in the left lower lobe pulmonary arterial vessels. There is pulmonary embolus, incompletely obstructing in the distal most aspect of the left main pulmonary artery. The right ventricle to left ventricle diameter ratio is 1.6, consistent with right heart strain. There is no demonstrable thoracic aortic aneurysm or dissection. The visualized great vessels appear unremarkable. There is no pericardial effusion or pericardial thickening. The main pulmonary outflow tract measures 3.5 cm in diameter. Mediastinum/Nodes: Visualized thyroid appears normal. There is no appreciable thoracic adenopathy. No esophageal lesions are evident. Lungs/Pleura: There is atelectatic change in the right lower lobe. There may be mild superimposed pneumonia in this area. Lungs elsewhere are clear. No demonstrable pulmonary infarct or pleural effusion. Upper Abdomen: There is diffuse soft tissue fullness involving and surrounding the pancreas. This soft tissue fullness extends laterally on the left to the splenic hilum. There is concern  for adenopathy in this region. Gallbladder absent. Visualized upper abdominal structures otherwise appear normal. Musculoskeletal: There is slight anterior wedging of the T8 vertebral body. No blastic or lytic bone lesions. No chest wall lesions evident. Review of the MIP images confirms the above findings. IMPRESSION: 1. Extensive pulmonary embolus bilaterally, somewhat more severe on the right than on the left. Positive for acute PE with CT evidence of right heart strain (RV/LV Ratio = 1.6) consistent with at least submassive (intermediate risk) PE. The presence of right heart strain has been associated with an increased risk of morbidity and mortality. Please activate Code PE by paging [phone]. 2. Enlargement of the main pulmonary outflow tract, a finding indicative of underlying pulmonary arterial hypertension. 3. Incomplete visualization of the pancreas/peripancreatic region. There is diffuse soft tissue fullness involving and potentially surrounding the pancreas. May be adenopathy in this area. Dedicated CT or MR of the abdomen with particular attention the pancreatic region advised within patient able based on treatment for pulmonary embolus with right heart strain acutely. 4. Atelectasis right lower lobe with questionable mild superimposed pneumonia. 5.  No adenopathy evident in the thoracic region. 6.  Gallbladder absent. Critical Value/emergent results  were called by telephone at the time of interpretation on 01/29/2019 at 3:03 pm to Royal , who verbally acknowledged these results. Electronically Signed   By: Lowella Grip III M.D.   On: 01/29/2019 15:04   US Venous Img Lower Bilateral  Result Date: 01/30/2019 CLINICAL DATA:  Acute bilateral pulmonary embolism. EXAM: BILATERAL LOWER EXTREMITY VENOUS DOPPLER ULTRASOUND TECHNIQUE: Gray-scale sonography with graded compression, as well as color Doppler and duplex ultrasound were performed to evaluate the lower extremity deep  venous systems from the level of the common femoral vein and including the common femoral, femoral, profunda femoral, popliteal and calf veins including the posterior tibial, peroneal and gastrocnemius veins when visible. The superficial great saphenous vein was also interrogated. Spectral Doppler was utilized to evaluate flow at rest and with distal augmentation maneuvers in the common femoral, femoral and popliteal veins. COMPARISON:  None. FINDINGS: RIGHT LOWER EXTREMITY Common Femoral Vein: No evidence of thrombus. Normal compressibility, respiratory phasicity and response to augmentation. Saphenofemoral Junction: No evidence of thrombus. Normal compressibility and flow on color Doppler imaging. Profunda Femoral Vein: No evidence of thrombus. Normal compressibility and flow on color Doppler imaging. Femoral Vein: No evidence of thrombus. Normal compressibility, respiratory phasicity and response to augmentation. Popliteal Vein: No evidence of thrombus. Normal compressibility, respiratory phasicity and response to augmentation. Calf Veins: No evidence of thrombus. Normal compressibility and flow on color Doppler imaging. Superficial Great Saphenous Vein: No evidence of thrombus. Normal compressibility. Venous Reflux:  None. Other Findings: No evidence of superficial thrombophlebitis or abnormal fluid collection. LEFT LOWER EXTREMITY Common Femoral Vein: Acute thrombus in the left common femoral vein occupies much of the lumen but is not completely occlusive with flow present. Saphenofemoral Junction: Some of the common femoral venous clot does extend into the saphenofemoral junction and GSV. Profunda Femoral Vein: No evidence of thrombus. Normal compressibility and flow on color Doppler imaging. Femoral Vein: Acute thrombus in the proximal to mid femoral vein of the thigh without complete occlusion. Popliteal Vein: No evidence of thrombus. Normal compressibility, respiratory phasicity and response to  augmentation. Calf Veins: No evidence of thrombus. Normal compressibility and flow on color Doppler imaging. Superficial Great Saphenous Vein: Upper thigh segment of the GSV contains some thrombus at the level of the saphenofemoral junction. Venous Reflux:  None. Other Findings:  None. IMPRESSION: 1. DVT of the left lower extremity with nonocclusive thrombus in the left common femoral vein and femoral vein. Some of the common femoral venous thrombus does extend just into the GSV across the SFJ. 2. No evidence of right lower extremity DVT. Electronically Signed   By: Aletta Edouard M.D.   On: 01/30/2019 09:52   Ct Pancreas Abd W/wo  Result Date: 01/30/2019 CLINICAL DATA:  Pulmonary embolism. Chest CT demonstrating soft tissue fullness in the region of the pancreas. EXAM: CT ABDOMEN WITHOUT AND WITH CONTRAST TECHNIQUE: Multidetector CT imaging of the abdomen was performed following the standard protocol before and following the bolus administration of intravenous contrast. CONTRAST:  162mL OMNIPAQUE IOHEXOL 300 MG/ML  SOLN COMPARISON:  CTA chest of 1 day prior. Abdominopelvic CT 07/15/2013. FINDINGS: Lower chest: Moderate right hemidiaphragm elevation. Bilateral pulmonary emboli, as detailed on prior dedicated CT. Normal heart size without pericardial or pleural effusion. Hepatobiliary: Normal liver. Normal gallbladder, without biliary ductal dilatation. Pancreas: The pancreas is compressed by the peripancreatic and diffuse retroperitoneal abdominal process, but enhances normally. No pancreatic duct dilatation. Spleen: Normal in size, without focal abnormality. Adrenals/Urinary Tract: Normal adrenal glands. No renal  calculi. Although the abdominal mass surrounds the anterior and medial interpolar left kidney, extending into the left renal pelvis on 38/14, there is only minimal left-sided caliectasis. Too small to characterize lesions in the left kidney. Normal right kidney. Stomach/Bowel: Normal stomach. The  transverse duodenum is compressed by the abdominal mass, but is not obstructed. Normal colon. Vascular/Lymphatic: Normal aortic caliber. Compression of celiac, SMA, portal, splenic veins without thrombus. Centered within the peripancreatic space of the abdominal retroperitoneum and contiguous small-bowel mesentery is a dominant nodal mass, including at on the order of 16.6 x 8.5 cm on 30/2/14. Concurrent abdominal retroperitoneal adenopathy, with a left periaortic node measuring 1.4 cm on 84/8. Gastrohepatic ligament index node measures 1.2 cm on 47/8. Left abdominal omental nodule of 9 mm on 37/14. Midline abdominal wall nodularity, including at 1.7 cm on 56/14. Other: No significant free fluid. Musculoskeletal: No acute osseous abnormality. IMPRESSION: 1. Findings most consistent with abdominal lymphoma. Dominant abdominal mass with retroperitoneal adenopathy and omental nodularity. 2. Mild left-sided caliectasis, secondary to presumed lymphomatous extension into the left renal pelvis. 3. Bilateral pulmonary emboli, as on chest CT. Electronically Signed   By: Abigail Miyamoto M.D.   On: 01/30/2019 16:28    Labs:  CBC: Recent Labs    01/29/19 1043 01/30/19 0435 01/31/19 0435  WBC 11.1* 8.0 6.0  HGB 19.2* 14.1 13.8  HCT 54.8* 41.2 40.5  PLT 220 177 171    COAGS: Recent Labs    01/29/19 1540  INR 1.1  APTT 30    BMP: Recent Labs    01/29/19 1043 01/30/19 0435 01/31/19 0435  NA 134* 136 135  K 4.6 3.8 3.8  CL 100 104 103  CO2 21* 22 22  GLUCOSE 313* 226* 211*  BUN 15 12 9   CALCIUM 9.5 8.2* 8.6*  CREATININE 1.10 0.85 0.88  GFRNONAA >60 >60 >60  GFRAA >60 >60 >60    LIVER FUNCTION TESTS: Recent Labs    01/30/19 0435  BILITOT 0.9  AST 13*  ALT 13  ALKPHOS 66  PROT 6.0*  ALBUMIN 3.3*    TUMOR MARKERS: No results for input(s): AFPTM, CEA, CA199, CHROMGRNA in the last 8760 hours.  Assessment and Plan:   Edward Atkinson is a 58 y.o. male with no significant past medical  history who presented to the emergency department on 01/29/2019 with shortness of breath found to have submassive pulmonary embolism.  For this, the patient underwent catheter directed thrombectomy.  The chest CTA was also noted to demonstrate an ill-defined infiltrative mass within the abdominal mesentery which was confirmed on subsequent dedicated CT scan of the abdomen and pelvis performed 01/30/2019 demonstrating findings worrisome for abdominal lymphoma.  As such, request made for CT-guided biopsy of this mesenteric mass for tissue diagnostic purposes prior to being transitioned from a heparin drip to a long-term anticoagulant.  Risks and benefits of CT-guided mesenteric mass biopsy was discussed with the patient and/or patient's family including, but not limited to bleeding, infection, damage to adjacent structures or low yield requiring additional tests.  All of the questions were answered and there is agreement to proceed.  Consent signed and in chart.  Thank you for this interesting consult.  I greatly enjoyed meeting LAIK KELLUM and look forward to participating in their care.  A copy of this report was sent to the requesting provider on this date.  Electronically Signed: Sandi Mariscal, MD 01/31/2019, 3:30 PM   I spent a total of 20 Minutes in face to  face in clinical consultation, greater than 50% of which was counseling/coordinating care for CT-guided mesenteric mass biopsy

## 2019-01-31 NOTE — Progress Notes (Signed)
Inpatient Diabetes Program Recommendations  AACE/ADA: New Consensus Statement on Inpatient Glycemic Control (2015)  Target Ranges:  Prepandial:   less than 140 mg/dL      Peak postprandial:   less than 180 mg/dL (1-2 hours)      Critically ill patients:  140 - 180 mg/dL   Lab Results  Component Value Date   HGBA1C 10.5 (H) 01/30/2019    Review of Glycemic Control  Diabetes history: No prior hx Current orders for Inpatient glycemic control: None  Inpatient Diabetes Program Recommendations:   Discussed patient diabetes management with Dr. Posey Pronto and patient is now on glycemic control order set with Novolog sensitive correction tid & hs 0-5 units. Ordered Living Well With Diabetes and dietician consult. Noted patient for procedure today but will plan to meet with patient for discussion of diabetes.  Thank you, Nani Gasser. Jerika Wales, RN, MSN, CDE  Diabetes Coordinator Inpatient Glycemic Control Team Team Pager [phone] (8am-5pm) 01/31/2019 11:47 AM

## 2019-01-31 NOTE — Procedures (Signed)
Pre procedural Dx: Omental Mass worrisome for abdominal lymphoma Post procedural Dx: Same  Technically successful CT guided biopsy of omental mass   EBL: None.  Complications: None immediate.   Ronny Bacon, MD Pager #: [phone]

## 2019-01-31 NOTE — Progress Notes (Signed)
Patient clinically stable post biopsy,tolerated well. Awake/alert and oriented post procedure/omental biopsy per Dr Pascal Lux. Vitals stable throughout procedure. Received Versed 2mg  along with Fentanyl 146mcg IV. Denies complaints at this time.

## 2019-01-31 NOTE — Progress Notes (Signed)
CT pancreas abdomen with and without contrast was independently reviewed by me. Patient has dominant abdominal mass with n retroperitoneal adenopathy and omental nodularity, concerning for underlying malignancy which probably contributed to acute pulmonary embolism.  Reviewed these results with patient over the phone this morning. I recommend obtaining biopsy to establish tissue diagnosis.  Discussed with patient that in order to biopsy, heparin will need to be early for a few hours then resume after the biopsy.  Risk of temporary interruption of anticoagulation includes but not limited to worsening of pulmonary embolism which may be life-threatening.  He is doing clinically well from PE standpoint point.  Patient agrees with the plan  Eventually patient will be switched to Lovenox 1 mg/kg twice daily in the context of possible underlying malignancy.  I have discussed with Dr. Posey Pronto and she will discuss with IR to see if it is feasible to proceed with biopsy.

## 2019-01-31 NOTE — Progress Notes (Addendum)
[place] [facility] [place] NAME: Edward Atkinson    MR#:  [id number]  DATE OF BIRTH:  07/04/1960  SUBJECTIVE:  patient came in after significant shortness of breath. He was found to have bilateral massive PE and left lower extremity DVT. Patient currently is doing well.  Denies any complaints.  He reports cramping of his legs both lower extremity for one month. Denies any cramping at present.  REVIEW OF SYSTEMS:   Review of Systems  Constitutional: Negative for chills, fever and weight loss.  HENT: Negative for ear discharge, ear pain and nosebleeds.   Eyes: Negative for blurred vision, pain and discharge.  Respiratory: Negative for sputum production, shortness of breath, wheezing and stridor.   Cardiovascular: Negative for chest pain, palpitations, orthopnea and PND.  Gastrointestinal: Negative for abdominal pain, diarrhea, nausea and vomiting.  Genitourinary: Negative for frequency and urgency.  Musculoskeletal: Negative for back pain and joint pain.  Neurological: Negative for sensory change, speech change, focal weakness and weakness.  Psychiatric/Behavioral: Negative for depression and hallucinations. The patient is not nervous/anxious.    Tolerating Diet: yes Tolerating PT: not needed  DRUG ALLERGIES:   Allergies  Allergen Reactions  . Penicillins Rash    VITALS:  Blood pressure 118/83, pulse 92, temperature 98.2 F (36.8 C), resp. rate 18, height 6' (1.829 m), weight 100.5 kg, SpO2 96 %.  PHYSICAL EXAMINATION:   Physical Exam  GENERAL:  58 y.o.-year-old patient lying in the bed with no acute distress.  EYES: Pupils equal, round, reactive to light and accommodation. No scleral icterus. Extraocular muscles intact.  HEENT: Head atraumatic, normocephalic. Oropharynx and nasopharynx clear.  NECK:  Supple, no jugular venous distention. No thyroid enlargement, no tenderness.  LUNGS: Normal breath sounds bilaterally, no wheezing,  rales, rhonchi. No use of accessory muscles of respiration.  CARDIOVASCULAR: S1, S2 normal. No murmurs, rubs, or gallops.  ABDOMEN: Soft, nontender, nondistended. Bowel sounds present. No organomegaly or mass.  EXTREMITIES: No cyanosis, clubbing or edema b/l.    NEUROLOGIC: Cranial nerves II through XII are intact. No focal Motor or sensory deficits b/l.   PSYCHIATRIC:  patient is alert and oriented x 3.  SKIN: No obvious rash, lesion, or ulcer.   LABORATORY PANEL:  CBC Recent Labs  Lab 01/31/19 0435  WBC 6.0  HGB 13.8  HCT 40.5  PLT 171    Chemistries  Recent Labs  Lab 01/30/19 0435 01/31/19 0435  NA 136 135  K 3.8 3.8  CL 104 103  CO2 22 22  GLUCOSE 226* 211*  BUN 12 9  CREATININE 0.85 0.88  CALCIUM 8.2* 8.6*  AST 13*  --   ALT 13  --   ALKPHOS 66  --   BILITOT 0.9  --    Cardiac Enzymes No results for input(s): TROPONINI in the last 168 hours. RADIOLOGY:  Dg Chest 2 View  Result Date: 01/29/2019 CLINICAL DATA:  Shortness of breath. EXAM: CHEST - 2 VIEW COMPARISON:  None. FINDINGS: The cardiomediastinal silhouette is within normal limits. The lungs are hypoinflated with mild-to-moderate elevation of the right hemidiaphragm. No airspace consolidation, edema, pleural effusion, pneumothorax is identified. There is mild anterior wedging of 2 adjacent thoracic vertebral bodies, approximately T7 and T8. IMPRESSION: 1. Hypoinflation and right hemidiaphragm elevation without evidence of pneumonia or edema. 2. Mild anterior wedging of 2 adjacent mid thoracic vertebral bodies, age indeterminate. Electronically Signed   By: Logan Bores M.D.   On: 01/29/2019 12:43  Ct Angio Chest Pe W And/or Wo Contrast  Result Date: 01/29/2019 CLINICAL DATA:  Shortness of breath and hypoxia. EXAM: CT ANGIOGRAPHY CHEST WITH CONTRAST TECHNIQUE: Multidetector CT imaging of the chest was performed using the standard protocol during bolus administration of intravenous contrast. Multiplanar CT image  reconstructions and MIPs were obtained to evaluate the vascular anatomy. CONTRAST:  3mL OMNIPAQUE IOHEXOL 350 MG/ML SOLN COMPARISON:  Chest radiograph January 29, 2019 FINDINGS: Cardiovascular: Extensive pulmonary embolus arises from the proximal right main pulmonary artery extending peripherally with pulmonary embolus in multiple right upper and right middle lobe pulmonary artery branches. There is a lesser degree of pulmonary embolus extend into the right upper lobe pulmonary arteries, most notably posteriorly. There are multiple pulmonary emboli in the left lower lobe pulmonary arterial vessels. There is pulmonary embolus, incompletely obstructing in the distal most aspect of the left main pulmonary artery. The right ventricle to left ventricle diameter ratio is 1.6, consistent with right heart strain. There is no demonstrable thoracic aortic aneurysm or dissection. The visualized great vessels appear unremarkable. There is no pericardial effusion or pericardial thickening. The main pulmonary outflow tract measures 3.5 cm in diameter. Mediastinum/Nodes: Visualized thyroid appears normal. There is no appreciable thoracic adenopathy. No esophageal lesions are evident. Lungs/Pleura: There is atelectatic change in the right lower lobe. There may be mild superimposed pneumonia in this area. Lungs elsewhere are clear. No demonstrable pulmonary infarct or pleural effusion. Upper Abdomen: There is diffuse soft tissue fullness involving and surrounding the pancreas. This soft tissue fullness extends laterally on the left to the splenic hilum. There is concern for adenopathy in this region. Gallbladder absent. Visualized upper abdominal structures otherwise appear normal. Musculoskeletal: There is slight anterior wedging of the T8 vertebral body. No blastic or lytic bone lesions. No chest wall lesions evident. Review of the MIP images confirms the above findings. IMPRESSION: 1. Extensive pulmonary embolus bilaterally,  somewhat more severe on the right than on the left. Positive for acute PE with CT evidence of right heart strain (RV/LV Ratio = 1.6) consistent with at least submassive (intermediate risk) PE. The presence of right heart strain has been associated with an increased risk of morbidity and mortality. Please activate Code PE by paging [phone]. 2. Enlargement of the main pulmonary outflow tract, a finding indicative of underlying pulmonary arterial hypertension. 3. Incomplete visualization of the pancreas/peripancreatic region. There is diffuse soft tissue fullness involving and potentially surrounding the pancreas. May be adenopathy in this area. Dedicated CT or MR of the abdomen with particular attention the pancreatic region advised within patient able based on treatment for pulmonary embolus with right heart strain acutely. 4. Atelectasis right lower lobe with questionable mild superimposed pneumonia. 5.  No adenopathy evident in the thoracic region. 6.  Gallbladder absent. Critical Value/emergent results were called by telephone at the time of interpretation on 01/29/2019 at 3:03 pm to [place] , who verbally acknowledged these results. Electronically Signed   By: Lowella Grip III M.D.   On: 01/29/2019 15:04   US Venous Img Lower Bilateral  Result Date: 01/30/2019 CLINICAL DATA:  Acute bilateral pulmonary embolism. EXAM: BILATERAL LOWER EXTREMITY VENOUS DOPPLER ULTRASOUND TECHNIQUE: Gray-scale sonography with graded compression, as well as color Doppler and duplex ultrasound were performed to evaluate the lower extremity deep venous systems from the level of the common femoral vein and including the common femoral, femoral, profunda femoral, popliteal and calf veins including the posterior tibial, peroneal and gastrocnemius veins when visible. The superficial great saphenous  vein was also interrogated. Spectral Doppler was utilized to evaluate flow at rest and with distal augmentation  maneuvers in the common femoral, femoral and popliteal veins. COMPARISON:  None. FINDINGS: RIGHT LOWER EXTREMITY Common Femoral Vein: No evidence of thrombus. Normal compressibility, respiratory phasicity and response to augmentation. Saphenofemoral Junction: No evidence of thrombus. Normal compressibility and flow on color Doppler imaging. Profunda Femoral Vein: No evidence of thrombus. Normal compressibility and flow on color Doppler imaging. Femoral Vein: No evidence of thrombus. Normal compressibility, respiratory phasicity and response to augmentation. Popliteal Vein: No evidence of thrombus. Normal compressibility, respiratory phasicity and response to augmentation. Calf Veins: No evidence of thrombus. Normal compressibility and flow on color Doppler imaging. Superficial Great Saphenous Vein: No evidence of thrombus. Normal compressibility. Venous Reflux:  None. Other Findings: No evidence of superficial thrombophlebitis or abnormal fluid collection. LEFT LOWER EXTREMITY Common Femoral Vein: Acute thrombus in the left common femoral vein occupies much of the lumen but is not completely occlusive with flow present. Saphenofemoral Junction: Some of the common femoral venous clot does extend into the saphenofemoral junction and GSV. Profunda Femoral Vein: No evidence of thrombus. Normal compressibility and flow on color Doppler imaging. Femoral Vein: Acute thrombus in the proximal to mid femoral vein of the thigh without complete occlusion. Popliteal Vein: No evidence of thrombus. Normal compressibility, respiratory phasicity and response to augmentation. Calf Veins: No evidence of thrombus. Normal compressibility and flow on color Doppler imaging. Superficial Great Saphenous Vein: Upper thigh segment of the GSV contains some thrombus at the level of the saphenofemoral junction. Venous Reflux:  None. Other Findings:  None. IMPRESSION: 1. DVT of the left lower extremity with nonocclusive thrombus in the left  common femoral vein and femoral vein. Some of the common femoral venous thrombus does extend just into the GSV across the SFJ. 2. No evidence of right lower extremity DVT. Electronically Signed   By: Aletta Edouard M.D.   On: 01/30/2019 09:52   Ct Pancreas Abd W/wo  Result Date: 01/30/2019 CLINICAL DATA:  Pulmonary embolism. Chest CT demonstrating soft tissue fullness in the region of the pancreas. EXAM: CT ABDOMEN WITHOUT AND WITH CONTRAST TECHNIQUE: Multidetector CT imaging of the abdomen was performed following the standard protocol before and following the bolus administration of intravenous contrast. CONTRAST:  137mL OMNIPAQUE IOHEXOL 300 MG/ML  SOLN COMPARISON:  CTA chest of 1 day prior. Abdominopelvic CT 07/15/2013. FINDINGS: Lower chest: Moderate right hemidiaphragm elevation. Bilateral pulmonary emboli, as detailed on prior dedicated CT. Normal heart size without pericardial or pleural effusion. Hepatobiliary: Normal liver. Normal gallbladder, without biliary ductal dilatation. Pancreas: The pancreas is compressed by the peripancreatic and diffuse retroperitoneal abdominal process, but enhances normally. No pancreatic duct dilatation. Spleen: Normal in size, without focal abnormality. Adrenals/Urinary Tract: Normal adrenal glands. No renal calculi. Although the abdominal mass surrounds the anterior and medial interpolar left kidney, extending into the left renal pelvis on 38/14, there is only minimal left-sided caliectasis. Too small to characterize lesions in the left kidney. Normal right kidney. Stomach/Bowel: Normal stomach. The transverse duodenum is compressed by the abdominal mass, but is not obstructed. Normal colon. Vascular/Lymphatic: Normal aortic caliber. Compression of celiac, SMA, portal, splenic veins without thrombus. Centered within the peripancreatic space of the abdominal retroperitoneum and contiguous small-bowel mesentery is a dominant nodal mass, including at on the order of 16.6 x  8.5 cm on 30/2/14. Concurrent abdominal retroperitoneal adenopathy, with a left periaortic node measuring 1.4 cm on 84/8. Gastrohepatic ligament index node measures 1.2  cm on 47/8. Left abdominal omental nodule of 9 mm on 37/14. Midline abdominal wall nodularity, including at 1.7 cm on 56/14. Other: No significant free fluid. Musculoskeletal: No acute osseous abnormality. IMPRESSION: 1. Findings most consistent with abdominal lymphoma. Dominant abdominal mass with retroperitoneal adenopathy and omental nodularity. 2. Mild left-sided caliectasis, secondary to presumed lymphomatous extension into the left renal pelvis. 3. Bilateral pulmonary emboli, as on chest CT. Electronically Signed   By: Abigail Miyamoto M.D.   On: 01/30/2019 16:28   ASSESSMENT AND PLAN:  Edward Atkinson  is a 58 y.o. male with no past medical history, no recent travel or broken bones is presenting to the ED with a chief complaint of shortness of breath.  Patient has been having cough for the past 1 week associated with shortness of breath.  #Acute hypoxic respiratory failure from massive bilateral pulmonary embolism and left femoral vein DVT -patient is status post mechanical tThrombectomy to the right and left pulmonary arteries by Dr. Lucky Cowboy -overall hemodynamically stable. Oxygen saturation more than 92% on room air -  on heparin drip-- continue for 48 hours transition to oral eliquis per Dr. Tasia Catchings -ultrasound Doppler shows left femoral vein DVT  # Abdominal Lymphadenopathy noted on CT abdomen -D/w Dr Payton Spark will be better to do biopsy while pt is on Heparin gtt and then start Lovenox 1 mg/kg SQ bid thereafter -Dr Pascal Lux IR will plan to do biopsy later today. Will need to hold heparin for couple hours prior to the procedure.  # DM-2--new -A1c 10.5% -will start po glipizide and SSI  #Polycythemia-unclear etiology Hemoglobin 19.2 oncology Dr. Tasia Catchings-- to follow-up labs  #Leukocytosis probably inflammatory/reactive No fever  #Sinus  tachycardia-could be from right heart strain from underlying pulmonary embolism -patient in sinus rhythm.  *DVT prophylaxis on heparin drip  Discussed with pt and agreeable with plan. Dr Tasia Catchings aware of above  Case discussed with Care Management/Social Worker. Management plans discussed with the patient, family and they are in agreement.  CODE STATUS: full  DVT Prophylaxis: heparin  TOTAL TIME TAKING CARE OF THIS PATIENT: **30* minutes.  >50% time spent on counselling and coordination of care  POSSIBLE D/C IN **1 to 2 DAYS, DEPENDING ON CLINICAL CONDITION.  Note: This dictation was prepared with Dragon dictation along with smaller phrase technology. Any transcriptional errors that result from this process are unintentional.  Fritzi Mandes M.D on 01/31/2019 at 9:26 AM  Between 7am to 6pm - Pager - [phone]  After 6pm go to [email or web address] - password [email or web address] [facility]  Office  [phone]  CC: Primary care physician; Rusty Aus, MDPatient ID: Edward Atkinson, male   DOB: 1960/08/24, 58 y.o.   MRN: [id number]

## 2019-01-31 NOTE — Consult Note (Signed)
[facility] for Heparin Drip Indication: pulmonary embolus  Patient Measurements: Height: 6' (182.9 cm) Weight: 221 lb 8 oz (100.5 kg) IBW/kg (Calculated) : 77.6 Heparin Dosing Weight: 97.8 kg  Vital Signs: Temp: 98.2 F (36.8 C) (10/01 0754) Temp Source: Oral (10/01 0431) BP: 127/91 (10/01 1600) Pulse Rate: 92 (10/01 1600)  Labs: Recent Labs    01/29/19 1043 01/29/19 1540  01/30/19 0435 01/30/19 0717 01/30/19 1342 01/31/19 0435  HGB 19.2*  --   --  14.1  --   --  13.8  HCT 54.8*  --   --  41.2  --   --  40.5  PLT 220  --   --  177  --   --  171  APTT  --  30  --   --   --   --   --   LABPROT  --  14.5  --   --   --   --   --   INR  --  1.1  --   --   --   --   --   HEPARINUNFRC  --   --    < >  --  0.41 0.35 0.35  CREATININE 1.10  --   --  0.85  --   --  0.88   < > = values in this interval not displayed.    Estimated Creatinine Clearance: 109.6 mL/min (by C-G formula based on SCr of 0.88 mg/dL).   Medical History: Past Medical History:  Diagnosis Date  . Diverticulitis     Medications:  No medications prior to admission.   Scheduled:  Infusions:  PRN:  Anti-infectives (From admission, onward)   Start     Dose/Rate Route Frequency Ordered Stop   01/30/19 0000  clindamycin (CLEOCIN) IVPB 300 mg    Note to Pharmacy: To be given in specials   300 mg 100 mL/hr over 30 Minutes Intravenous  Once 01/29/19 1607 01/30/19 0002      Assessment: Pharmacy has been consulted to initiate Heparin drip in 58yo patient. Upon CT Angiogram of chest, found with extensive bilateral pulmonary emboli, somewhat more severe on the right than on the left. Patient has no prior history of PTA anticoagulant use. CT-guided biopsy of a mesenteric mass for tissue diagnostic purposes was performed today and the heparin was held prior to the procedure.  Heparin Course 0929 @2156  HL: 0.26, subtherapeutic. Called nurse to verify if there were heparin  interruptions and was told there was were none. However, she was unable to verify if it was interrupted in Cath lab. Per Mar, it was stopped at 1640 and restarted 1815.  0929 @ 2357 HL = 0.23, subtherapeutic & is trending down 0930 @ 0717 HL = 0.41, therapeutic 0930 @ 1342 HL = 0.35, therapeutic 1001 @ 0435 HL = 0.35, therapeutic 1001 1016 heparin drip held for CT-guided biopsy  Goal of Therapy:  Heparin level 0.3-0.7 units/ml Monitor platelets by anticoagulation protocol: Yes   Plan:   Restart heparin infusion at 1850 units/hr at 2000 per MD request  Dr Posey Pronto is starting Lovenox 100 mg BID tomorrow morning at 1000, renal function has been stable  D/C heparin infusion one hour prior to starting Lovenox  Because the rate has proved therapeutic for several days and because it will be discontinued at 0900 10/02 a heparin level will not be ordered  CBC in am   Continue to monitor H&H and platelets  Izora Ribas  Raquel Sarna, PharmD 01/31/2019,4:26 PM

## 2019-02-01 LAB — CBC
HCT: 39.5 % (ref 39.0–52.0)
Hemoglobin: 13.7 g/dL (ref 13.0–17.0)
MCH: 30.4 pg (ref 26.0–34.0)
MCHC: 34.7 g/dL (ref 30.0–36.0)
MCV: 87.6 fL (ref 80.0–100.0)
Platelets: 158 10*3/uL (ref 150–400)
RBC: 4.51 MIL/uL (ref 4.22–5.81)
RDW: 12 % (ref 11.5–15.5)
WBC: 6.7 10*3/uL (ref 4.0–10.5)
nRBC: 0 % (ref 0.0–0.2)

## 2019-02-01 LAB — GLUCOSE, CAPILLARY
Glucose-Capillary: 181 mg/dL — ABNORMAL HIGH (ref 70–99)
Glucose-Capillary: 191 mg/dL — ABNORMAL HIGH (ref 70–99)

## 2019-02-01 MED ORDER — GUAIFENESIN-DM 100-10 MG/5ML PO SYRP: 5.0000 mL | ORAL_SOLUTION | ORAL | 0 refills | Status: DC | PRN

## 2019-02-01 MED ORDER — GLIPIZIDE ER 10 MG PO TB24: 10.0000 mg | ORAL_TABLET | Freq: Every day | ORAL | 1 refills | Status: DC

## 2019-02-01 MED ORDER — ENOXAPARIN SODIUM 100 MG/ML ~~LOC~~ SOLN: 100.0000 mg | Freq: Two times a day (BID) | SUBCUTANEOUS | 1 refills | Status: DC

## 2019-02-01 NOTE — TOC Transition Note (Signed)
Transition of Care [facility]) - CM/SW Discharge Note   Patient Details  Name: Edward Atkinson MRN: [id number] Date of Birth: [birth year]  [facility] CM/SW Contact:  Elza Rafter, RN Phone Number: 02/01/2019, 2:02 PM   Clinical Narrative:   Lovenox co-pay will be $15/mo  Patient and Colletta [place], RN aware.      Final next level of care: Home/Self Care Barriers to Discharge: No Barriers Identified   Patient Goals and CMS Choice        Discharge Placement                       Discharge Plan and Services                                     Social Determinants of Health (SDOH) Interventions     Readmission Risk Interventions No flowsheet data found.

## 2019-02-01 NOTE — Progress Notes (Signed)
Inpatient Diabetes Program Recommendations  AACE/ADA: New Consensus Statement on Inpatient Glycemic Control (2015)  Target Ranges:  Prepandial:   less than 140 mg/dL      Peak postprandial:   less than 180 mg/dL (1-2 hours)      Critically ill patients:  140 - 180 mg/dL    Results for Edward Atkinson, Edward Atkinson (MRN [id number]) as of 02/01/2019 10:08  Ref. Range 01/31/2019 12:33 01/31/2019 16:56 01/31/2019 20:52 02/01/2019 07:47  Glucose-Capillary Latest Ref Range: 70 - 99 mg/dL 183 (H) 140 (H) 171 (H) 181 (H)   Results for Edward Atkinson, Edward Atkinson (MRN [id number]) as of 02/01/2019 10:08  Ref. Range 01/30/2019 04:35  Hemoglobin A1C Latest Ref Range: 4.8 - 5.6 % 10.5 (H)  (254 mg/dl)    Admit with: Massive bilateral PE   New Diagnosis of Diabetes also made this admission    Current Orders: Novolog Sensitive Correction Scale/ SSI (0-9 units) TID AC + HS     Glipizide 10 mg Daily     PCP: Dr. Emily Filbert with [facility] follow up appointment 02/13/2019 at 10:45am     Spoke with pt and wife about new diagnosis.  Discussed A1C results with them and explained what an A1C is, basic pathophysiology of DM Type 2, basic home care, basic diabetes diet nutrition principles, importance of checking CBGs and maintaining good CBG control to prevent long-term and short-term complications.  Reviewed signs and symptoms of hyperglycemia and hypoglycemia and how to treat hypoglycemia at home.  Also reviewed blood sugar goals and A1c goals for home.  Encouraged pt to check his CBGs at least BID at home (always 1st thing in the AM and vary the 2nd check of the day either before a meal or 2 hours after a meal).  RNs to provide ongoing basic DM education at bedside with this patient.  Have ordered educational booklet.  Have also placed RD consult for DM diet education for this patient (seen by RD for diet edu this AM).  Also discussed DM diet information with patient.  Encouraged patient to avoid beverages  with sugar (regular soda, sweet tea, lemonade, fruit juice) and to consume mostly water.  Discussed what foods contain carbohydrates and how carbohydrates affect the body's blood sugar levels.  Encouraged patient to be careful with his portion sizes (especially grains, starchy vegetables, and fruits).  Explained to patient that men should have 60-75 grams of carbohydrates per meal per day.  Also reviewed the plate method for healthful DM eating at home.  Have placed referral to the Lifestyle center for further OP DM education after d/c at pt's request.  Also reviewed d/c medication of Glipizide with wife and pt (how to take, when to take, what it does, side effects, etc).    --Will follow patient during hospitalization--  Wyn Quaker RN, MSN, CDE Diabetes Coordinator Inpatient Glycemic Control Team Team Pager: [phone] (8a-5p)

## 2019-02-01 NOTE — TOC Benefit Eligibility Note (Signed)
Transition of Care [facility]) Benefit Eligibility Note    Patient Details  Name: DAEJOHN AUMENT MRN: [id number] Date of Birth: [birth year]   Medication/Dose: Lovenox 100 mg BID           Spoke with Person/Company/Phone Number:: Christy Sartorius, [phone], Tried to run a test claim but con contract with [employer] order set up.  Called [facility] [phone], placed on hold 22 min, called again & placed on hold, no answer  Co-Pay: Unable to obain        Additional Notes: MD had already sent Rx to [facility], relayed above message to RN CM    [place] Phone Number: 02/01/2019, 1:08 PM

## 2019-02-01 NOTE — Progress Notes (Signed)
 Vein & Vascular Surgery Daily Progress Note   Subjective: 3 Days Post-Op: 1. Contrast injection right heart 2. Thrombolysis Right and leftpulmonary arteries 3. Mechanical thrombectomyto the right upper lobe, middle lobe, and lower lobepulmonary arteriesas well as the main right pulmonary artery and to the left upper lobe and left lower lobe pulmonary arteries 4. Selective catheter placement rightupper lobe, middle lobe, and lower lobe pulmonary arteries 5. Selective catheter placement leftupper lobe and lower lobe pulmonary arteries  Patient without complaint. No issues overnight. Coughing improving. SOB markedly improved. Denies any lower extremity pain or swelling.   Objective: Vitals:   01/31/19 1831 01/31/19 2000 02/01/19 0431 02/01/19 0746  BP: 126/79 121/78 123/86 130/83  Pulse: (!) 102 100 97 98  Resp:  20 20 19   Temp:  98.8 F (37.1 C) 98.7 F (37.1 C) 98.7 F (37.1 C)  TempSrc:  Oral Oral Oral  SpO2: 90% 91% (!) 89% 93%  Weight:   100.2 kg   Height:        Intake/Output Summary (Last 24 hours) at 02/01/2019 1040 Last data filed at 02/01/2019 0400 Gross per 24 hour  Intake 154.02 ml  Output 0 ml  Net 154.02 ml   Physical Exam: A&Ox3, NAD CV: RRR Pulmonary: CTA Bilaterally, Non-labored Abdomen: Soft, Nontender, Nondistended Right Groin: PAD in removed. No swelling or drainage.  Vascular:             Left Lower Extremity: Thigh soft. Calf soft. Extremity warm distally to toes. Pedal pulses intact. Minimal swelling. Non-tender to palpation.    Laboratory: CBC    Component Value Date/Time   WBC 6.7 02/01/2019 0421   HGB 13.7 02/01/2019 0421   HCT 39.5 02/01/2019 0421   PLT 158 02/01/2019 0421   BMET    Component Value Date/Time   NA 135 01/31/2019 0435   K 3.8 01/31/2019 0435   CL 103 01/31/2019 0435   CO2 22 01/31/2019 0435   GLUCOSE 211 (H) 01/31/2019 0435   BUN 9  01/31/2019 0435   CREATININE 0.88 01/31/2019 0435   CALCIUM 8.6 (L) 01/31/2019 0435   GFRNONAA >60 01/31/2019 0435   GFRAA >60 01/31/2019 0435   Assessment/Planning: The patient is a 58 year old male with no real significant past medical history who presented to the [facility] with progressively worsening shortness of breath found to have left lower extremity DVT and PE s/p pulmonary thrombolysis POD#3 1) Bilateral pulmonary emboli s/p pulmonary thrombectomy - with improved dyspnea this afternoon.  Off of supplemental oxygen.  Improved tachycardia.  2) left lower extremity DVT on venous duplex.  At this time, the patient is asymptomatic.  Denies any left lower extremity pain or swelling.  On physical exam there is minimal edema.  Nontender to palpation.  There is no acute vascular compromise to left lower extremity at this time.  Do not recommend any lysis.   3) Transitioned from Heparin to Lovenox for anticoagulation  4) Patient can be discharged home from our standpoint with follow up in our office.   Discussed with Dr. Ellis Parents Floyde Dingley PA-C 02/01/2019 10:40 AM

## 2019-02-01 NOTE — TOC Transition Note (Signed)
Transition of Care [facility]) - CM/SW Discharge Note   Patient Details  Name: MOTT HUMBLE MRN: [id number] Date of Birth: 13-Dec-1960  [facility] CM/SW Contact:  Elza Rafter, RN Phone Number: 02/01/2019, 10:09 AM   Clinical Narrative:  Patient is discharging to home today.  Case manager assistant is checking on cost of Lovenox 100mg /BID.  Otherwise no needs identified by [facility] team.      Final next level of care: Home/Self Care Barriers to Discharge: No Barriers Identified   Patient Goals and CMS Choice        Discharge Placement                       Discharge Plan and Services                                     Social Determinants of Health (SDOH) Interventions     Readmission Risk Interventions No flowsheet data found.

## 2019-02-01 NOTE — Discharge Summary (Signed)
[place] [facility] [place] NAME: Edward Atkinson    MR#:  [id number]  DATE OF BIRTH:  [birth year]  DATE OF ADMISSION:  01/29/2019 ADMITTING PHYSICIAN: Nicholes Mango, MD  DATE OF DISCHARGE: 02/01/2019  PRIMARY CARE PHYSICIAN: Rusty Aus, MD    ADMISSION DIAGNOSIS:  Calf tenderness [M79.669] Other pulmonary embolism with acute cor pulmonale, unspecified chronicity (HCC) [I26.09] Massive pulmonary hemorrhage originating in perinatal period [P26.1]  DISCHARGE DIAGNOSIS:   Massive bilateral PE status post mechanical thrombectomy left femoral vein DVT SECONDARY DIAGNOSIS:   Past Medical History:  Diagnosis Date  . Diverticulitis     HOSPITAL COURSE:   [place] a58 y.o.malewith no past medical history, no recent travel or broken bones is presenting to the ED with a chief complaint of shortness of breath. Patient has been having cough for the past 1 week associated with shortness of breath.  #Acute hypoxic respiratory failure from massive bilateral pulmonary embolism and left femoral vein DVT -patient is status post mechanical tThrombectomy to the right and left pulmonary arteries by Dr. Lucky Cowboy -overall hemodynamically stable. Oxygen saturation more than 92% on room air -  on heparin drip-- continue for 48 hours transition to Lovenox 1 mg per kilogram subcu BID per Dr. Tasia Catchings -ultrasound Doppler shows left femoral vein DVT  # Abdominal Lymphadenopathy noted on CT abdomen -status post biopsy of omental mass by Dr. Pascal Lux. Patient denies any pain.  # [id number], without complication -[id number] -will start po glipizide and SSI -glucometer prescribed  #Polycythemia-unclear etiology Hemoglobin 19.2 oncology Dr. Tasia Catchings-- to follow-up labs  #Leukocytosis probably inflammatory/reactive No fever  #Sinus tachycardia-could be from right heart strain from underlying pulmonary embolism -patient in sinus rhythm.  *DVT  prophylaxis on lovenox  D/c home--pt agreeable Dr Tasia Catchings informed CONSULTS OBTAINED:    DRUG ALLERGIES:   Allergies  Allergen Reactions  . Penicillins Rash    DISCHARGE MEDICATIONS:   Allergies as of 02/01/2019      Reactions   Penicillins Rash      Medication List    TAKE these medications   enoxaparin 100 MG/ML injection Commonly known as: LOVENOX Inject 1 mL (100 mg total) into the skin every 12 (twelve) hours.   glipiZIDE 10 MG 24 hr tablet Commonly known as: GLUCOTROL XL Take 1 tablet (10 mg total) by mouth daily with breakfast. Start taking on: February 02, 2019   guaiFENesin-dextromethorphan 100-10 MG/5ML syrup Commonly known as: ROBITUSSIN DM Take 5 mLs by mouth every 4 (four) hours as needed for cough.       If you experience worsening of your admission symptoms, develop shortness of breath, life threatening emergency, suicidal or homicidal thoughts you must seek medical attention immediately by calling 911 or calling your MD immediately  if symptoms less severe.  You Must read complete instructions/literature along with all the possible adverse reactions/side effects for all the Medicines you take and that have been prescribed to you. Take any new Medicines after you have completely understood and accept all the possible adverse reactions/side effects.   Please note  You were cared for by a hospitalist during your hospital stay. If you have any questions about your discharge medications or the care you received while you were in the hospital after you are discharged, you can call the unit and asked to speak with the hospitalist on call if the hospitalist that took care of you is not available. Once you are discharged, your primary care physician will handle  any further medical issues. Please note that NO REFILLS for any discharge medications will be authorized once you are discharged, as it is imperative that you return to your primary care physician (or establish a  relationship with a primary care physician if you do not have one) for your aftercare needs so that they can reassess your need for medications and monitor your lab values. Today   SUBJECTIVE   Doing well. Some dry cough VITAL SIGNS:  Blood pressure 130/83, pulse 98, temperature 98.7 F (37.1 C), temperature source Oral, resp. rate 19, height 6' (1.829 m), weight 100.2 kg, SpO2 93 %.  I/O:    Intake/Output Summary (Last 24 hours) at 02/01/2019 0941 Last data filed at 02/01/2019 0400 Gross per 24 hour  Intake 154.02 ml  Output 0 ml  Net 154.02 ml    PHYSICAL EXAMINATION:  GENERAL:  58 y.o.-year-old patient lying in the bed with no acute distress.  EYES: Pupils equal, round, reactive to light and accommodation. No scleral icterus. Extraocular muscles intact.  HEENT: Head atraumatic, normocephalic. Oropharynx and nasopharynx clear.  NECK:  Supple, no jugular venous distention. No thyroid enlargement, no tenderness.  LUNGS: Normal breath sounds bilaterally, no wheezing, rales,rhonchi or crepitation. No use of accessory muscles of respiration.  CARDIOVASCULAR: S1, S2 normal. No murmurs, rubs, or gallops.  ABDOMEN: Soft, non-tender, non-distended. Bowel sounds present. No organomegaly or mass.  EXTREMITIES: No pedal edema, cyanosis, or clubbing.  NEUROLOGIC: Cranial nerves II through XII are intact. Muscle strength 5/5 in all extremities. Sensation intact. Gait not checked.  PSYCHIATRIC: The patient is alert and oriented x 3.  SKIN: No obvious rash, lesion, or ulcer.   DATA REVIEW:   CBC  Recent Labs  Lab 02/01/19 0421  WBC 6.7  HGB 13.7  HCT 39.5  PLT 158    Chemistries  Recent Labs  Lab 01/30/19 0435 01/31/19 0435  NA 136 135  K 3.8 3.8  CL 104 103  CO2 22 22  GLUCOSE 226* 211*  BUN 12 9  CREATININE 0.85 0.88  CALCIUM 8.2* 8.6*  AST 13*  --   ALT 13  --   ALKPHOS 66  --   BILITOT 0.9  --     Microbiology Results   Recent Results (from the past 240 hour(s))   SARS Coronavirus 2 [facility] order, Performed in [facility] hospital lab) Nasopharyngeal Nasopharyngeal Swab     Status: None   Collection Time: 01/29/19  2:04 PM   Specimen: Nasopharyngeal Swab  Result Value Ref Range Status   SARS Coronavirus 2 NEGATIVE NEGATIVE Final    Comment: (NOTE) If result is NEGATIVE SARS-CoV-2 target nucleic acids are NOT DETECTED. The SARS-CoV-2 RNA is generally detectable in upper and lower  respiratory specimens during the acute phase of infection. The lowest  concentration of SARS-CoV-2 viral copies this assay can detect is 250  copies / mL. A negative result does not preclude SARS-CoV-2 infection  and should not be used as the sole basis for treatment or other  patient management decisions.  A negative result may occur with  improper specimen collection / handling, submission of specimen other  than nasopharyngeal swab, presence of viral mutation(s) within the  areas targeted by this assay, and inadequate number of viral copies  (<250 copies / mL). A negative result must be combined with clinical  observations, patient history, and epidemiological information. If result is POSITIVE SARS-CoV-2 target nucleic acids are DETECTED. The SARS-CoV-2 RNA is generally detectable in upper and lower  respiratory specimens dur ing the acute phase of infection.  Positive  results are indicative of active infection with SARS-CoV-2.  Clinical  correlation with patient history and other diagnostic information is  necessary to determine patient infection status.  Positive results do  not rule out bacterial infection or co-infection with other viruses. If result is PRESUMPTIVE POSTIVE SARS-CoV-2 nucleic acids MAY BE PRESENT.   A presumptive positive result was obtained on the submitted specimen  and confirmed on repeat testing.  While 2019 novel coronavirus  (SARS-CoV-2) nucleic acids may be present in the submitted sample  additional confirmatory testing may be  necessary for epidemiological  and / or clinical management purposes  to differentiate between  SARS-CoV-2 and other Sarbecovirus currently known to infect humans.  If clinically indicated additional testing with an alternate test  methodology [phone]) is advised. The SARS-CoV-2 RNA is generally  detectable in upper and lower respiratory sp ecimens during the acute  phase of infection. The expected result is Negative. Fact Sheet for Patients:  StrictlyIdeas.no Fact Sheet for Healthcare Providers: BankingDealers.co.za This test is not yet approved or cleared by the [place] FDA and has been authorized for detection and/or diagnosis of SARS-CoV-2 by FDA under an Emergency Use Authorization (EUA).  This EUA will remain in effect (meaning this test can be used) for the duration of the COVID-19 declaration under Section 564(b)(1) of the Act, 21 U.S.C. section 360bbb-3(b)(1), unless the authorization is terminated or revoked sooner. Performed at [facility], [address] [place], [place] [address]     RADIOLOGY:  Ct Guided Needle Placement  Result Date: 01/31/2019 INDICATION: No known primary, now with concern for abdominal lymphoma. Please perform CT-guided biopsy omental mass for tissue diagnostic purposes. EXAM: CT GUIDANCE NEEDLE PLACEMENT COMPARISON:  CT abdomen and pelvis-01/30/2019 MEDICATIONS: None. ANESTHESIA/SEDATION: Fentanyl 100 mcg IV; Versed 2 mg IV Sedation time: 17 minutes; The patient was continuously monitored during the procedure by the interventional radiology nurse under my direct supervision. CONTRAST:  None. COMPLICATIONS: None immediate. PROCEDURE: Informed consent was obtained from the patient following an explanation of the procedure, risks, benefits and alternatives. A time out was performed prior to the initiation of the procedure. The patient was positioned supine on the CT table and a limited CT was  performed for procedural planning demonstrating unchanged size and appearance of omental/mesenteric mass with dominant component measuring approximately 16.2 x 9.1 cm (image 46, series 2). The procedure was planned. The operative site was prepped and draped in the usual sterile fashion. Appropriate trajectory was confirmed with a 22 gauge spinal needle after the adjacent tissues were anesthetized with 1% Lidocaine with epinephrine. Under intermittent CT guidance, a 17 gauge coaxial needle was advanced into the peripheral aspect of the mass. Appropriate positioning was confirmed and 8 core needle biopsy samples were obtained with an 18 gauge core needle biopsy device. The co-axial needle was removed following the administration of a Gel-Foam slurry and superficial hemostasis was achieved with manual compression. A limited postprocedural CT was negative for hemorrhage or additional complication. A dressing was placed. The patient tolerated the procedure well without immediate postprocedural complication. IMPRESSION: Technically successful CT guided core needle biopsy of omental/mesenteric mass. Electronically Signed   By: Sandi Mariscal M.D.   On: 01/31/2019 16:28   Ct Pancreas Abd W/wo  Result Date: 01/30/2019 CLINICAL DATA:  Pulmonary embolism. Chest CT demonstrating soft tissue fullness in the region of the pancreas. EXAM: CT ABDOMEN WITHOUT AND WITH CONTRAST TECHNIQUE: Multidetector CT imaging  of the abdomen was performed following the standard protocol before and following the bolus administration of intravenous contrast. CONTRAST:  122mL OMNIPAQUE IOHEXOL 300 MG/ML  SOLN COMPARISON:  CTA chest of 1 day prior. Abdominopelvic CT 07/15/2013. FINDINGS: Lower chest: Moderate right hemidiaphragm elevation. Bilateral pulmonary emboli, as detailed on prior dedicated CT. Normal heart size without pericardial or pleural effusion. Hepatobiliary: Normal liver. Normal gallbladder, without biliary ductal dilatation.  Pancreas: The pancreas is compressed by the peripancreatic and diffuse retroperitoneal abdominal process, but enhances normally. No pancreatic duct dilatation. Spleen: Normal in size, without focal abnormality. Adrenals/Urinary Tract: Normal adrenal glands. No renal calculi. Although the abdominal mass surrounds the anterior and medial interpolar left kidney, extending into the left renal pelvis on 38/14, there is only minimal left-sided caliectasis. Too small to characterize lesions in the left kidney. Normal right kidney. Stomach/Bowel: Normal stomach. The transverse duodenum is compressed by the abdominal mass, but is not obstructed. Normal colon. Vascular/Lymphatic: Normal aortic caliber. Compression of celiac, SMA, portal, splenic veins without thrombus. Centered within the peripancreatic space of the abdominal retroperitoneum and contiguous small-bowel mesentery is a dominant nodal mass, including at on the order of 16.6 x 8.5 cm on 30/2/14. Concurrent abdominal retroperitoneal adenopathy, with a left periaortic node measuring 1.4 cm on 84/8. Gastrohepatic ligament index node measures 1.2 cm on 47/8. Left abdominal omental nodule of 9 mm on 37/14. Midline abdominal wall nodularity, including at 1.7 cm on 56/14. Other: No significant free fluid. Musculoskeletal: No acute osseous abnormality. IMPRESSION: 1. Findings most consistent with abdominal lymphoma. Dominant abdominal mass with retroperitoneal adenopathy and omental nodularity. 2. Mild left-sided caliectasis, secondary to presumed lymphomatous extension into the left renal pelvis. 3. Bilateral pulmonary emboli, as on chest CT. Electronically Signed   By: Abigail Miyamoto M.D.   On: 01/30/2019 16:28     CODE STATUS:     Code Status Orders  (From admission, onward)         Start     Ordered   01/29/19 1601  Full code  Continuous     01/29/19 1601        Code Status History    This patient has a current code status but no historical code  status.   Advance Care Planning Activity    Advance Directive Documentation     Most Recent Value  Type of Advance Directive  Living will, Healthcare Power of Attorney  Pre-existing out of facility DNR order (yellow form or pink MOST form)  -  "MOST" Form in Place?  -      TOTAL TIME TAKING CARE OF THIS PATIENT: *40* minutes.    Fritzi Mandes M.D on 02/01/2019 at 9:41 AM  Between 7am to 6pm - Pager - [phone] After 6pm go to [email or web address] - password [email or web address] [place]  Office  [phone]  CC: Primary care physician; Rusty Aus, MD

## 2019-02-01 NOTE — Plan of Care (Signed)
Nutrition Education Note   RD consulted for nutrition education regarding diabetes.   58 y/o male with new DM diagnosis   Lab Results  Component Value Date   HGBA1C 10.5 (H) 01/30/2019    RD provided "Nutrition and Type II Diabetes" handout from the Academy of Nutrition and Dietetics. Discussed different food groups and their effects on blood sugar, emphasizing carbohydrate-containing foods. Provided list of carbohydrates and recommended serving sizes of common foods.  Discussed importance of controlled and consistent carbohydrate intake throughout the day. Provided examples of ways to balance meals/snacks and encouraged intake of high-fiber, whole grain complex carbohydrates. Teach back method used.  Expect good compliance.  Body mass index is 29.97 kg/m. Pt meets criteria for overweight based on current BMI.  Current diet order is CHO modified, patient is consuming approximately 100% of meals at this time. Labs and medications reviewed. No further nutrition interventions warranted at this time. RD contact information provided. If additional nutrition issues arise, please re-consult RD.  Koleen Distance MS, RD, LDN Pager #- [phone] Office#- [phone] After Hours Pager: [phone]

## 2019-02-01 NOTE — Discharge Instructions (Addendum)
Check your sugars at least 2 times a day and keep log of it:  Every AM before breakfast and vary the 2nd check of the day either before another meal or 2 hours after starting a meal.  Fingerstick glucose (sugar) goals for home: Before meals: 80-130 mg/dl 2-Hours after meals: less than 180 mg/dl Hemoglobin A1c goal: 7% or less

## 2019-02-01 NOTE — Progress Notes (Signed)
Patient discharged to home. Tele and IV d/c'd prior to discharge. Patient demonstrated giving self lovenox injection with wife at bedside.  Tolerated well. States feels comfortable with injections.

## 2019-02-04 ENCOUNTER — Encounter: Payer: Self-pay | Admitting: Oncology

## 2019-02-04 ENCOUNTER — Telehealth: Payer: Self-pay | Admitting: Oncology

## 2019-02-04 ENCOUNTER — Other Ambulatory Visit (INDEPENDENT_AMBULATORY_CARE_PROVIDER_SITE_OTHER): Payer: Self-pay | Admitting: Vascular Surgery

## 2019-02-04 ENCOUNTER — Other Ambulatory Visit: Payer: Self-pay | Admitting: Anatomic Pathology & Clinical Pathology

## 2019-02-04 ENCOUNTER — Other Ambulatory Visit: Payer: Self-pay | Admitting: Oncology

## 2019-02-04 DIAGNOSIS — Z86718 Personal history of other venous thrombosis and embolism: Secondary | ICD-10-CM

## 2019-02-04 DIAGNOSIS — C8299 Follicular lymphoma, unspecified, extranodal and solid organ sites: Secondary | ICD-10-CM

## 2019-02-04 LAB — SURGICAL PATHOLOGY

## 2019-02-04 NOTE — Telephone Encounter (Signed)
Called patient and communicated with him about path results.  Recommend obtaining PET scan.  I will see him outpatient after PET to discuss results and management plan.  -he agrees with the plan.

## 2019-02-06 DIAGNOSIS — E11649 Type 2 diabetes mellitus with hypoglycemia without coma: Secondary | ICD-10-CM | POA: Insufficient documentation

## 2019-02-06 DIAGNOSIS — E119 Type 2 diabetes mellitus without complications: Secondary | ICD-10-CM | POA: Insufficient documentation

## 2019-02-06 DIAGNOSIS — I2609 Other pulmonary embolism with acute cor pulmonale: Secondary | ICD-10-CM | POA: Insufficient documentation

## 2019-02-06 DIAGNOSIS — Z0000 Encounter for general adult medical examination without abnormal findings: Secondary | ICD-10-CM | POA: Diagnosis not present

## 2019-02-06 DIAGNOSIS — C8280 Other types of follicular lymphoma, unspecified site: Secondary | ICD-10-CM | POA: Insufficient documentation

## 2019-02-07 ENCOUNTER — Encounter (INDEPENDENT_AMBULATORY_CARE_PROVIDER_SITE_OTHER): Payer: Self-pay | Admitting: Nurse Practitioner

## 2019-02-07 ENCOUNTER — Ambulatory Visit (INDEPENDENT_AMBULATORY_CARE_PROVIDER_SITE_OTHER): Payer: BC Managed Care – PPO

## 2019-02-07 ENCOUNTER — Other Ambulatory Visit: Payer: BC Managed Care – PPO

## 2019-02-07 ENCOUNTER — Other Ambulatory Visit: Payer: Self-pay

## 2019-02-07 ENCOUNTER — Ambulatory Visit (INDEPENDENT_AMBULATORY_CARE_PROVIDER_SITE_OTHER): Payer: BC Managed Care – PPO | Admitting: Nurse Practitioner

## 2019-02-07 DIAGNOSIS — I82412 Acute embolism and thrombosis of left femoral vein: Secondary | ICD-10-CM | POA: Diagnosis not present

## 2019-02-07 DIAGNOSIS — Z86718 Personal history of other venous thrombosis and embolism: Secondary | ICD-10-CM | POA: Diagnosis not present

## 2019-02-07 DIAGNOSIS — N419 Inflammatory disease of prostate, unspecified: Secondary | ICD-10-CM | POA: Insufficient documentation

## 2019-02-07 MED ORDER — APIXABAN 5 MG PO TABS: 5.0000 mg | ORAL_TABLET | Freq: Two times a day (BID) | ORAL | 11 refills | Status: DC

## 2019-02-07 NOTE — Progress Notes (Signed)
Tumor Board Documentation  Edward Atkinson was presented by Dr Tasia Catchings at our Tumor Board on 02/07/2019, which included representatives from medical oncology, radiation oncology, pathology, radiology, surgical, navigation, internal medicine, pharmacy, genetics, palliative care, research.  Edward Atkinson currently presents as a new patient, for Edward Atkinson, for new positive pathology with history of the following treatments: active survellience, surgical intervention(s).  Additionally, we reviewed previous medical and familial history, history of present illness, and recent lab results along with all available histopathologic and imaging studies. The tumor board considered available treatment options and made the following recommendations: Additional screening, Chemotherapy    The following procedures/referrals were also placed: No orders of the defined types were placed in this encounter.   Clinical Trial Status: not discussed   Staging used: Pathologic Stage  AJCC Staging:       Group: Low Grade Follicular Lymphoma WHO grade 1-2   National site-specific guidelines   were discussed with respect to the case.  Tumor board is a meeting of clinicians from various specialty areas who evaluate and discuss patients for whom a multidisciplinary approach is being considered. Final determinations in the plan of care are those of the provider(s). The responsibility for follow up of recommendations given during tumor board is that of the provider.   Today's extended care, comprehensive team conference, Edward Atkinson was not present for the discussion and was not examined.   Multidisciplinary Tumor Board is a multidisciplinary case peer review process.  Decisions discussed in the Multidisciplinary Tumor Board reflect the opinions of the specialists present at the conference without having examined the patient.  Ultimately, treatment and diagnostic decisions rest with the primary provider(s) and the patient.

## 2019-02-08 ENCOUNTER — Ambulatory Visit: Payer: BC Managed Care – PPO | Admitting: Oncology

## 2019-02-11 ENCOUNTER — Encounter
Admission: RE | Admit: 2019-02-11 | Discharge: 2019-02-11 | Disposition: A | Discharge status: Home / Self Care | Payer: BC Managed Care – PPO | Source: Ambulatory Visit | Attending: Oncology | Admitting: Oncology

## 2019-02-11 ENCOUNTER — Other Ambulatory Visit: Payer: Self-pay

## 2019-02-11 DIAGNOSIS — C8290 Follicular lymphoma, unspecified, unspecified site: Secondary | ICD-10-CM | POA: Diagnosis not present

## 2019-02-11 DIAGNOSIS — C8299 Follicular lymphoma, unspecified, extranodal and solid organ sites: Secondary | ICD-10-CM

## 2019-02-11 LAB — GLUCOSE, CAPILLARY: Glucose-Capillary: 134 mg/dL — ABNORMAL HIGH (ref 70–99)

## 2019-02-11 MED ORDER — FLUDEOXYGLUCOSE F - 18 (FDG) INJECTION
11.3000 | Freq: Once | INTRAVENOUS | Status: AC | PRN
  Administered 2019-02-11: 12.2 via INTRAVENOUS

## 2019-02-12 ENCOUNTER — Encounter (INDEPENDENT_AMBULATORY_CARE_PROVIDER_SITE_OTHER): Payer: Self-pay | Admitting: Nurse Practitioner

## 2019-02-12 DIAGNOSIS — I82409 Acute embolism and thrombosis of unspecified deep veins of unspecified lower extremity: Secondary | ICD-10-CM | POA: Insufficient documentation

## 2019-02-12 LAB — CALR + JAK2 E12-15 + MPL (REFLEXED)

## 2019-02-12 LAB — JAK2 V617F, W REFLEX TO CALR/E12/MPL

## 2019-02-12 NOTE — Progress Notes (Signed)
SUBJECTIVE:  Patient ID: Edward Atkinson, male    DOB: 22-Feb-1961, 58 y.o.   MRN: [id number] Chief Complaint  Patient presents with  . Follow-up    HPI  Edward Atkinson is a 58 y.o. male presents today after bilateral pulmonary thrombectomy.  The patient states that he feels like his breathing is doing much better at this time.  Currently the patient is on Lovenox due to the fact that they believe that he could have a malignant process ongoing that caused his extensive DVT and subsequent pulmonary embolism.  This is due to the fact that they may want to do a biopsy or surgery soon.  They will transition the patient to Eliquis after this point.  He denies any fever, chills, nausea, vomiting or diarrhea.  He denies any swelling of his lower extremities.  He states that he is feeling much better walking wise.  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  No evidence of superficial venous thrombosis and no evidence of chronic venous insufficiency.  Past Medical History:  Diagnosis Date  . Diverticulitis     Past Surgical History:  Procedure Laterality Date  . CHOLECYSTECTOMY    . COLON RESECTION    . PULMONARY THROMBECTOMY N/A 01/29/2019   Procedure: PULMONARY THROMBECTOMY;  Surgeon: Algernon Huxley, MD;  Location: [facility];  Service: Cardiovascular;  Laterality: N/A;    Social History   Socioeconomic History  . Marital status: Married    Spouse name: Not on file  . Number of children: Not on file  . Years of education: Not on file  . Highest education level: Not on file  Occupational History  . Not on file  Social Needs  . Financial resource strain: Not on file  . Food insecurity    Worry: Not on file    Inability: Not on file  . Transportation needs    Medical: Not on file    Non-medical: Not on file  Tobacco Use  . Smoking status: Never Smoker  . Smokeless tobacco: Never Used  Substance and  Sexual Activity  . Alcohol use: Yes    Comment: occasional  . Drug use: Never  . Sexual activity: Not on file  Lifestyle  . Physical activity    Days per week: Not on file    Minutes per session: Not on file  . Stress: Not on file  Relationships  . Social Herbalist on phone: Not on file    Gets together: Not on file    Attends religious service: Not on file    Active member of club or organization: Not on file    Attends meetings of clubs or organizations: Not on file    Relationship status: Not on file  . Intimate partner violence    Fear of current or ex partner: Not on file    Emotionally abused: Not on file    Physically abused: Not on file    Forced sexual activity: Not on file  Other Topics Concern  . Not on file  Social History Narrative  . Not on file    History reviewed. No pertinent family history.  Allergies  Allergen Reactions  . Penicillins Rash     Review of Systems   Review of Systems: Negative Unless Checked Constitutional: [] Weight loss  [] Fever  [] Chills Cardiac: [] Chest pain   []  Atrial Fibrillation  [] Palpitations   []   Shortness of breath when laying flat   [x] Shortness of breath with exertion. [] Shortness of breath at rest Vascular:  [x] Pain in legs with walking   [] Pain in legs with standing [] Pain in legs when laying flat   [] Claudication    [] Pain in feet when laying flat    [x] History of DVT   [] Phlebitis   [] Swelling in legs   [] Varicose veins   [] Non-healing ulcers Pulmonary:   [] Uses home oxygen   [] Productive cough   [] Hemoptysis   [] Wheeze  [] COPD   [] Asthma Neurologic:  [] Dizziness   [] Seizures  [] Blackouts [] History of stroke   [] History of TIA  [] Aphasia   [] Temporary Blindness   [] Weakness or numbness in arm   [] Weakness or numbness in leg Musculoskeletal:   [] Joint swelling   [] Joint pain   [] Low back pain  []  History of Knee Replacement [] Arthritis [] back Surgeries  []  Spinal Stenosis    Hematologic:  [] Easy bruising  [] Easy  bleeding   [] Hypercoagulable state   [] Anemic Gastrointestinal:  [] Diarrhea   [] Vomiting  [] Gastroesophageal reflux/heartburn   [] Difficulty swallowing. [] Abdominal pain Genitourinary:  [] Chronic kidney disease   [] Difficult urination  [] Anuric   [] Blood in urine [] Frequent urination  [] Burning with urination   [] Hematuria Skin:  [] Rashes   [] Ulcers [] Wounds Psychological:  [] History of anxiety   []  History of major depression  []  Memory Difficulties      OBJECTIVE:   Physical Exam  BP 121/83 (BP Location: Left Arm, Patient Position: Sitting, Cuff Size: Normal)   Pulse 89   Resp 12   Ht 6' (1.829 m)   Wt 219 lb (99.3 kg)   BMI 29.70 kg/m   Gen: WD/WN, NAD Head: [place]/AT, No temporalis wasting.  Ear/Nose/Throat: Hearing grossly intact, nares w/o erythema or drainage Eyes: PER, EOMI, sclera nonicteric.  Neck: Supple, no masses.  No JVD.  Pulmonary:  Good air movement, no use of accessory muscles.  Cardiac: RRR Vascular:  Minimal edema bilaterally Vessel Right Left  Radial Palpable Palpable  Dorsalis Pedis Palpable Palpable  Posterior Tibial Palpable Palpable   Gastrointestinal: soft, non-distended. No guarding/no peritoneal signs.  Musculoskeletal: M/S 5/5 throughout.  No deformity or atrophy.  Neurologic: Pain and light touch intact in extremities.  Symmetrical.  Speech is fluent. Motor exam as listed above. Psychiatric: Judgment intact, Mood & affect appropriate for pt's clinical situation. Dermatologic: No Venous rashes. No Ulcers Noted.  No changes consistent with cellulitis. Lymph : No Cervical lymphadenopathy, no lichenification or skin changes of chronic lymphedema.       ASSESSMENT AND PLAN:  1. Acute deep vein thrombosis (DVT) of femoral vein of left lower extremity (HCC) The patient is currently on Lovenox due to concern for a malignant process that caused his DVT.  There is concern that the patient could need to have a biopsy which would necessitate stopping of  Eliquis for the procedure.  Today I have given the patient a prescription for Eliquis as well as co-pay cards for when he is able to fill the prescription.  The patient has an upcoming appointment with his oncologist after PET scan to determine what the further steps are.  At that time the patient will give [place] a call to let us know if he is able to transition Eliquis and if so give instructions on how to transition Eliquis.  Otherwise patient will follow-up in 6 weeks for examination of the acute DVT.  Current Outpatient Medications on File Prior to Visit  Medication Sig Dispense Refill  . enoxaparin (LOVENOX) 100 MG/ML injection Inject 1 mL (100 mg total) into the skin every 12 (twelve) hours. 30 mL 1  . glipiZIDE (GLUCOTROL XL) 10 MG 24 hr tablet Take 1 tablet (10 mg total) by mouth daily with breakfast. 30 tablet 1  . guaiFENesin-dextromethorphan (ROBITUSSIN DM) 100-10 MG/5ML syrup Take 5 mLs by mouth every 4 (four) hours as needed for cough. (Patient taking differently: Take 5 mLs by mouth every 4 (four) hours as needed for cough. PRN) 118 mL 0   No current facility-administered medications on file prior to visit.     There are no Patient Instructions on file for this visit. No follow-ups on file.   Kris Hartmann, NP  This note was completed with Sales executive.  Any errors are purely unintentional.

## 2019-02-13 ENCOUNTER — Other Ambulatory Visit: Payer: Self-pay

## 2019-02-13 NOTE — Progress Notes (Signed)
Patient pre screened for office appointment, no questions or concerns today. 

## 2019-02-14 ENCOUNTER — Inpatient Hospital Stay: Payer: BC Managed Care – PPO | Attending: Oncology | Admitting: Oncology

## 2019-02-14 ENCOUNTER — Other Ambulatory Visit: Payer: Self-pay

## 2019-02-14 ENCOUNTER — Telehealth: Payer: Self-pay

## 2019-02-14 ENCOUNTER — Inpatient Hospital Stay: Payer: BC Managed Care – PPO

## 2019-02-14 VITALS — BP 143/88 | HR 89 | Temp 98.8°F | Resp 18 | Wt 221.7 lb

## 2019-02-14 DIAGNOSIS — Z86718 Personal history of other venous thrombosis and embolism: Secondary | ICD-10-CM | POA: Insufficient documentation

## 2019-02-14 DIAGNOSIS — Z79899 Other long term (current) drug therapy: Secondary | ICD-10-CM | POA: Diagnosis not present

## 2019-02-14 DIAGNOSIS — Z5111 Encounter for antineoplastic chemotherapy: Secondary | ICD-10-CM | POA: Insufficient documentation

## 2019-02-14 DIAGNOSIS — Z7901 Long term (current) use of anticoagulants: Secondary | ICD-10-CM | POA: Insufficient documentation

## 2019-02-14 DIAGNOSIS — Z86711 Personal history of pulmonary embolism: Secondary | ICD-10-CM | POA: Insufficient documentation

## 2019-02-14 DIAGNOSIS — E119 Type 2 diabetes mellitus without complications: Secondary | ICD-10-CM | POA: Diagnosis not present

## 2019-02-14 DIAGNOSIS — C8293 Follicular lymphoma, unspecified, intra-abdominal lymph nodes: Secondary | ICD-10-CM | POA: Diagnosis not present

## 2019-02-14 DIAGNOSIS — I2609 Other pulmonary embolism with acute cor pulmonale: Secondary | ICD-10-CM | POA: Diagnosis not present

## 2019-02-14 DIAGNOSIS — Z7189 Other specified counseling: Secondary | ICD-10-CM

## 2019-02-14 DIAGNOSIS — C8280 Other types of follicular lymphoma, unspecified site: Secondary | ICD-10-CM

## 2019-02-14 DIAGNOSIS — Z7984 Long term (current) use of oral hypoglycemic drugs: Secondary | ICD-10-CM | POA: Insufficient documentation

## 2019-02-14 DIAGNOSIS — C8299 Follicular lymphoma, unspecified, extranodal and solid organ sites: Secondary | ICD-10-CM

## 2019-02-14 DIAGNOSIS — Z5112 Encounter for antineoplastic immunotherapy: Secondary | ICD-10-CM | POA: Insufficient documentation

## 2019-02-14 LAB — HEPATITIS PANEL, ACUTE
HCV Ab: NONREACTIVE
Hep A IgM: NONREACTIVE
Hep B C IgM: NONREACTIVE
Hepatitis B Surface Ag: NONREACTIVE

## 2019-02-14 LAB — CBC WITH DIFFERENTIAL/PLATELET
Abs Immature Granulocytes: 0.03 10*3/uL (ref 0.00–0.07)
Basophils Absolute: 0.1 10*3/uL (ref 0.0–0.1)
Basophils Relative: 1 %
Eosinophils Absolute: 0.1 10*3/uL (ref 0.0–0.5)
Eosinophils Relative: 2 %
HCT: 47 % (ref 39.0–52.0)
Hemoglobin: 15.5 g/dL (ref 13.0–17.0)
Immature Granulocytes: 0 %
Lymphocytes Relative: 20 %
Lymphs Abs: 1.5 10*3/uL (ref 0.7–4.0)
MCH: 29.9 pg (ref 26.0–34.0)
MCHC: 33 g/dL (ref 30.0–36.0)
MCV: 90.6 fL (ref 80.0–100.0)
Monocytes Absolute: 0.7 10*3/uL (ref 0.1–1.0)
Monocytes Relative: 9 %
Neutro Abs: 4.9 10*3/uL (ref 1.7–7.7)
Neutrophils Relative %: 68 %
Platelets: 248 10*3/uL (ref 150–400)
RBC: 5.19 MIL/uL (ref 4.22–5.81)
RDW: 12.5 % (ref 11.5–15.5)
WBC: 7.3 10*3/uL (ref 4.0–10.5)
nRBC: 0 % (ref 0.0–0.2)

## 2019-02-14 LAB — LACTATE DEHYDROGENASE: LDH: 125 U/L (ref 98–192)

## 2019-02-14 LAB — COMPREHENSIVE METABOLIC PANEL
ALT: 20 U/L (ref 0–44)
AST: 19 U/L (ref 15–41)
Albumin: 4.5 g/dL (ref 3.5–5.0)
Alkaline Phosphatase: 56 U/L (ref 38–126)
Anion gap: 10 (ref 5–15)
BUN: 19 mg/dL (ref 6–20)
CO2: 23 mmol/L (ref 22–32)
Calcium: 9.6 mg/dL (ref 8.9–10.3)
Chloride: 102 mmol/L (ref 98–111)
Creatinine, Ser: 1.11 mg/dL (ref 0.61–1.24)
GFR calc Af Amer: >60 mL/min (ref >60)
GFR calc non Af Amer: >60 mL/min (ref >60)
Glucose, Bld: 128 mg/dL — ABNORMAL HIGH (ref 70–99)
Potassium: 4.4 mmol/L (ref 3.5–5.1)
Sodium: 135 mmol/L (ref 135–145)
Total Bilirubin: 0.6 mg/dL (ref 0.3–1.2)
Total Protein: 7.6 g/dL (ref 6.5–8.1)

## 2019-02-14 MED ORDER — ENOXAPARIN SODIUM 100 MG/ML ~~LOC~~ SOLN: 100.0000 mg | Freq: Two times a day (BID) | SUBCUTANEOUS | 1 refills | Status: DC

## 2019-02-14 NOTE — Telephone Encounter (Signed)
Patient aware of bone marrow biopsy and MD follow up appointments.

## 2019-02-14 NOTE — Telephone Encounter (Signed)
Pt has been scheduled as requested

## 2019-02-14 NOTE — Telephone Encounter (Addendum)
Request for bone marrow biopsy has been faxed to central scheduling. Fax confirmation received. Once BM is scheduled patient will need to come back to office one week later for lab/MD/rituximab & Bendamistine *NEW*.

## 2019-02-14 NOTE — Progress Notes (Signed)
Hematology/Oncology Follow Up Note [facility]  [phone] [phone]  Patient Care Team: Rusty Aus, MD as PCP - General (Internal Medicine)   Name of the patient: Edward Atkinson  [id number]  [birth year]   REASON FOR VISIT  follow-up for management of pulmonary embolism, follicular cell lymphoma.  PERTINENT ONCOLOGY HISTORY # 01/31/2019 Unprovoked PE,  CT chest PE protocol reviewed bilateral massive pulmonary embolism with right heart strain and patient was started on heparin drip.  Patient was tachypneic and tachycardia.  Vascular surgeon was consulted and the patient was taken to the OR for thrombectomy and thrombolysis.  Patient also had selective catheter placement right upper lobe, middle lobe, lower lobe pulmonary arteries, left upper and lower pulmonary arteries. Ultrasound venous duplex bilateral showed DVT of the left lower extremity with nonocclusive thrombus in the left common femoral vein.  Some of the common femoral venous thrombus does extend just into the GSV across SF J.  No evidence of right lower extremity DVT. Patient was started on heparin drip for anticoagulation. Patient is status post embolectomy by vascular surgery. #Additional CT pancreas was done during that admission which showed peripancreatic/retroperitoneal and contiguous small bowel mesentery nodal mass, retroperitoneal adenopathy, gastrohepatic ligament lymphadenopathy, left abdominal omental nodule CT-guided omentum biopsy was done during hospital.  Patient was switched to Lovenox at discharge.   INTERVAL HISTORY 58 y.o. male with newly diagnosed pulmonary embolism and follicular lymphoma present for discussion of pathological reports and management plan. During the interval, patient has had PET scan done on 02/11/2019 which showed large conglomerate retroperitoneal and mesenteric mass with maximal SUV 10.6 compatible with Deauville 5 disease.  Deauville 4  adenopathy in the pelvis. A left axillary lymph node measuring 0.7 cm in short axis has a maximum SUV of 3.3. Mild splenomegaly without focal splenic lesion identified.  Patient reports doing well since the discharge of the hospital.  He is now on Lovenox 28m/kg twice daily. He also has had Eliquis 5 mg twice daily prescription but he is not taking it.  CT omental biopsy showed follicular center cell lymphoma, WHO grade 1-2/3  Today patient reports feeling well.  Shortness of breath has significantly improved.  No hemoptysis or easy bruising. Denies any pain, fatigue, night sweating.  Review of Systems  Constitutional: Positive for fatigue. Negative for appetite change, chills, fever and unexpected weight change.  HENT:   Negative for hearing loss and voice change.   Eyes: Negative for eye problems and icterus.  Respiratory: Negative for chest tightness, cough and shortness of breath.   Cardiovascular: Negative for chest pain and leg swelling.  Gastrointestinal: Negative for abdominal distention and abdominal pain.  Endocrine: Negative for hot flashes.  Genitourinary: Negative for difficulty urinating, dysuria and frequency.   Musculoskeletal: Negative for arthralgias.  Skin: Negative for itching and rash.  Neurological: Negative for light-headedness and numbness.  Hematological: Negative for adenopathy. Does not bruise/bleed easily.  Psychiatric/Behavioral: Negative for confusion.      Allergies  Allergen Reactions  . Penicillins Rash     Past Medical History:  Diagnosis Date  . Diverticulitis      Past Surgical History:  Procedure Laterality Date  . CHOLECYSTECTOMY    . COLON RESECTION    . PULMONARY THROMBECTOMY N/A 01/29/2019   Procedure: PULMONARY THROMBECTOMY;  Surgeon: DAlgernon Huxley MD;  [facility];  Service: Cardiovascular;  Laterality: N/A;    Social History   Socioeconomic History  . Marital status: Married  Spouse name: Not on file   . Number of children: Not on file  . Years of education: Not on file  . Highest education level: Not on file  Occupational History  . Not on file  Social Needs  . Financial resource strain: Not on file  . Food insecurity    Worry: Not on file    Inability: Not on file  . Transportation needs    Medical: Not on file    Non-medical: Not on file  Tobacco Use  . Smoking status: Never Smoker  . Smokeless tobacco: Never Used  Substance and Sexual Activity  . Alcohol use: Yes    Comment: occasional  . Drug use: Never  . Sexual activity: Not on file  Lifestyle  . Physical activity    Days per week: Not on file    Minutes per session: Not on file  . Stress: Not on file  Relationships  . Social Herbalist on phone: Not on file    Gets together: Not on file    Attends religious service: Not on file    Active member of club or organization: Not on file    Attends meetings of clubs or organizations: Not on file    Relationship status: Not on file  . Intimate partner violence    Fear of current or ex partner: Not on file    Emotionally abused: Not on file    Physically abused: Not on file    Forced sexual activity: Not on file  Other Topics Concern  . Not on file  Social History Narrative  . Not on file    No family history on file.   Current Outpatient Medications:  .  apixaban (ELIQUIS) 5 MG TABS tablet, Take 1 tablet (5 mg total) by mouth 2 (two) times daily., Disp: 60 tablet, Rfl: 11 .  enoxaparin (LOVENOX) 100 MG/ML injection, Inject 1 mL (100 mg total) into the skin every 12 (twelve) hours., Disp: 40 mL, Rfl: 1 .  glipiZIDE (GLUCOTROL XL) 10 MG 24 hr tablet, Take 1 tablet (10 mg total) by mouth daily with breakfast., Disp: 30 tablet, Rfl: 1 .  guaiFENesin-dextromethorphan (ROBITUSSIN DM) 100-10 MG/5ML syrup, Take 5 mLs by mouth every 4 (four) hours as needed for cough. (Patient not taking: Reported on 02/14/2019), Disp: 118 mL, Rfl: 0  Physical exam:   Vitals:   02/14/19 0954  BP: (!) 143/88  Pulse: 89  Resp: 18  Temp: 98.8 F (37.1 C)  TempSrc: Tympanic  Weight: 221 lb 11.2 oz (100.6 kg)   Physical Exam Constitutional:      General: He is not in acute distress. HENT:     Head: Normocephalic and atraumatic.  Eyes:     General: No scleral icterus.    Pupils: Pupils are equal, round, and reactive to light.  Neck:     Musculoskeletal: Normal range of motion and neck supple.  Cardiovascular:     Rate and Rhythm: Normal rate and regular rhythm.     Heart sounds: Normal heart sounds.  Pulmonary:     Effort: Pulmonary effort is normal. No respiratory distress.     Breath sounds: No wheezing.  Abdominal:     General: Bowel sounds are normal. There is no distension.     Palpations: Abdomen is soft. There is no mass.     Tenderness: There is no abdominal tenderness.  Musculoskeletal: Normal range of motion.        General: No deformity.  Skin:    General: Skin is warm and dry.     Findings: No erythema or rash.  Neurological:     Mental Status: He is alert and oriented to person, place, and time.     Cranial Nerves: No cranial nerve deficit.     Coordination: Coordination normal.  Psychiatric:        Behavior: Behavior normal.        Thought Content: Thought content normal.     CMP Latest Ref Rng & Units 02/14/2019  Glucose 70 - 99 mg/dL 128(H)  BUN 6 - 20 mg/dL 19  Creatinine 0.61 - 1.24 mg/dL 1.11  Sodium 135 - 145 mmol/L 135  Potassium 3.5 - 5.1 mmol/L 4.4  Chloride 98 - 111 mmol/L 102  CO2 22 - 32 mmol/L 23  Calcium 8.9 - 10.3 mg/dL 9.6  Total Protein 6.5 - 8.1 g/dL 7.6  Total Bilirubin 0.3 - 1.2 mg/dL 0.6  Alkaline Phos 38 - 126 U/L 56  AST 15 - 41 U/L 19  ALT 0 - 44 U/L 20   CBC Latest Ref Rng & Units 02/14/2019  WBC 4.0 - 10.5 K/uL 7.3  Hemoglobin 13.0 - 17.0 g/dL 15.5  Hematocrit 39.0 - 52.0 % 47.0  Platelets 150 - 400 K/uL 248    Dg Chest 2 View  Result Date: 01/29/2019 CLINICAL DATA:  Shortness  of breath. EXAM: CHEST - 2 VIEW COMPARISON:  None. FINDINGS: The cardiomediastinal silhouette is within normal limits. The lungs are hypoinflated with mild-to-moderate elevation of the right hemidiaphragm. No airspace consolidation, edema, pleural effusion, pneumothorax is identified. There is mild anterior wedging of 2 adjacent thoracic vertebral bodies, approximately T7 and T8. IMPRESSION: 1. Hypoinflation and right hemidiaphragm elevation without evidence of pneumonia or edema. 2. Mild anterior wedging of 2 adjacent mid thoracic vertebral bodies, age indeterminate. Electronically Signed   By: Logan Bores M.D.   On: 01/29/2019 12:43   Ct Angio Chest Pe W And/or Wo Contrast  Result Date: 01/29/2019 CLINICAL DATA:  Shortness of breath and hypoxia. EXAM: CT ANGIOGRAPHY CHEST WITH CONTRAST TECHNIQUE: Multidetector CT imaging of the chest was performed using the standard protocol during bolus administration of intravenous contrast. Multiplanar CT image reconstructions and MIPs were obtained to evaluate the vascular anatomy. CONTRAST:  39m OMNIPAQUE IOHEXOL 350 MG/ML SOLN COMPARISON:  Chest radiograph January 29, 2019 FINDINGS: Cardiovascular: Extensive pulmonary embolus arises from the proximal right main pulmonary artery extending peripherally with pulmonary embolus in multiple right upper and right middle lobe pulmonary artery branches. There is a lesser degree of pulmonary embolus extend into the right upper lobe pulmonary arteries, most notably posteriorly. There are multiple pulmonary emboli in the left lower lobe pulmonary arterial vessels. There is pulmonary embolus, incompletely obstructing in the distal most aspect of the left main pulmonary artery. The right ventricle to left ventricle diameter ratio is 1.6, consistent with right heart strain. There is no demonstrable thoracic aortic aneurysm or dissection. The visualized great vessels appear unremarkable. There is no pericardial effusion or  pericardial thickening. The main pulmonary outflow tract measures 3.5 cm in diameter. Mediastinum/Nodes: Visualized thyroid appears normal. There is no appreciable thoracic adenopathy. No esophageal lesions are evident. Lungs/Pleura: There is atelectatic change in the right lower lobe. There may be mild superimposed pneumonia in this area. Lungs elsewhere are clear. No demonstrable pulmonary infarct or pleural effusion. Upper Abdomen: There is diffuse soft tissue fullness involving and surrounding the pancreas. This soft tissue fullness extends laterally on the left to  the splenic hilum. There is concern for adenopathy in this region. Gallbladder absent. Visualized upper abdominal structures otherwise appear normal. Musculoskeletal: There is slight anterior wedging of the T8 vertebral body. No blastic or lytic bone lesions. No chest wall lesions evident. Review of the MIP images confirms the above findings. IMPRESSION: 1. Extensive pulmonary embolus bilaterally, somewhat more severe on the right than on the left. Positive for acute PE with CT evidence of right heart strain (RV/LV Ratio = 1.6) consistent with at least submassive (intermediate risk) PE. The presence of right heart strain has been associated with an increased risk of morbidity and mortality. Please activate Code PE by paging [phone]. 2. Enlargement of the main pulmonary outflow tract, a finding indicative of underlying pulmonary arterial hypertension. 3. Incomplete visualization of the pancreas/peripancreatic region. There is diffuse soft tissue fullness involving and potentially surrounding the pancreas. May be adenopathy in this area. Dedicated CT or MR of the abdomen with particular attention the pancreatic region advised within patient able based on treatment for pulmonary embolus with right heart strain acutely. 4. Atelectasis right lower lobe with questionable mild superimposed pneumonia. 5.  No adenopathy evident in the thoracic region. 6.   Gallbladder absent. Critical Value/emergent results were called by telephone at the time of interpretation on 01/29/2019 at 3:03 pm to Davison , who verbally acknowledged these results. Electronically Signed   By: Lowella Grip III M.D.   On: 01/29/2019 15:04   Ct Guided Needle Placement  Result Date: 01/31/2019 INDICATION: No known primary, now with concern for abdominal lymphoma. Please perform CT-guided biopsy omental mass for tissue diagnostic purposes. EXAM: CT GUIDANCE NEEDLE PLACEMENT COMPARISON:  CT abdomen and pelvis-01/30/2019 MEDICATIONS: None. ANESTHESIA/SEDATION: Fentanyl 100 mcg IV; Versed 2 mg IV Sedation time: 17 minutes; The patient was continuously monitored during the procedure by the interventional radiology nurse under my direct supervision. CONTRAST:  None. COMPLICATIONS: None immediate. PROCEDURE: Informed consent was obtained from the patient following an explanation of the procedure, risks, benefits and alternatives. A time out was performed prior to the initiation of the procedure. The patient was positioned supine on the CT table and a limited CT was performed for procedural planning demonstrating unchanged size and appearance of omental/mesenteric mass with dominant component measuring approximately 16.2 x 9.1 cm (image 46, series 2). The procedure was planned. The operative site was prepped and draped in the usual sterile fashion. Appropriate trajectory was confirmed with a 22 gauge spinal needle after the adjacent tissues were anesthetized with 1% Lidocaine with epinephrine. Under intermittent CT guidance, a 17 gauge coaxial needle was advanced into the peripheral aspect of the mass. Appropriate positioning was confirmed and 8 core needle biopsy samples were obtained with an 18 gauge core needle biopsy device. The co-axial needle was removed following the administration of a Gel-Foam slurry and superficial hemostasis was achieved with manual compression. A  limited postprocedural CT was negative for hemorrhage or additional complication. A dressing was placed. The patient tolerated the procedure well without immediate postprocedural complication. IMPRESSION: Technically successful CT guided core needle biopsy of omental/mesenteric mass. Electronically Signed   By: Sandi Mariscal M.D.   On: 01/31/2019 16:28   Nm Pet Image Initial (pi) Skull Base To Thigh  Result Date: 02/11/2019 CLINICAL DATA:  Initial treatment strategy for follicular lymphoma. EXAM: NUCLEAR MEDICINE PET SKULL BASE TO THIGH TECHNIQUE: 12.2 mCi F-18 FDG was injected intravenously. Full-ring PET imaging was performed from the skull base to thigh after the radiotracer. CT data was obtained  and used for attenuation correction and anatomic localization. Fasting blood glucose: 134 mg/dl COMPARISON:  Multiple exams, including CT abdomen 01/30/2019 FINDINGS: Mediastinal blood pool activity: SUV max 2.1 Liver activity: SUV max 3.9 NECK: Minimal accentuated activity in the vicinity of the left medial pterygoid plate adjacent to a supernumerary tooth along the posterior maxilla. No corresponding CT abnormality, maximum SUV 5.5. This is likely incidental. Incidental CT findings: none CHEST: Left axillary node measuring 0.7 cm in short axis on image 84/3 has a maximum SUV of 3.3, Deauville 3. Incidental CT findings: Mild atelectasis along the right hemidiaphragm. ABDOMEN/PELVIS: Large conglomerate retroperitoneal and mesenteric mass, measuring about 18.3 by 8.7 cm, maximum SUV 10.6, compatible with Deauville 5 disease. This partially surrounds the pancreas, extends along the splenic hilum, and extends into the left perirenal space but in the left kidney. There are individually enlarged and hypermetabolic pelvic lymph nodes especially on the right, including a right common iliac node measuring 1.4 cm in short axis on image 216/3 with maximum SUV 7.8 (Deauville 4), and a right external iliac node measuring 2.0 cm  in short axis on image 243/3 with maximum SUV 5.4, Deauville 4. There is a small right inguinal focus of accentuated metabolic activity and stranding in the subcutaneous tissues, maximum SUV 4.1, without a well-defined lymph node. Incidental CT findings: Cholecystectomy. No hydronephrosis. The spleen measures 12.6 by 5.7 by 13.0 cm (volume = 490 cm^3), without focal hypermetabolic activity. Postoperative findings in the rectum. Punctate calcifications along small scrotal hydroceles bilaterally. SKELETON: No significant abnormal hypermetabolic activity in this region. Incidental CT findings: Chronic bilateral pars defects at L5 with grade 2 anterolisthesis. Mesoacromial os acromiale on the left. IMPRESSION: 1. The large conglomerate retroperitoneal and mesenteric mass of the maximum SUV of 10.6 compatible with Deauville 5 disease. There is Deauville 4 adenopathy in the pelvis. 2. A left axillary lymph node measuring 0.7 cm in short axis has a maximum SUV of 3.3, compatible with Deauville 3 disease. 3. Minimally accentuated activity in the vicinity of the left medial pterygoid plate, without corresponding CT abnormality, probably incidental. 4. Mild splenomegaly, without focal splenic lesion identified. 5. Chronic pars defects at L5 with grade 2 anterolisthesis. 6. Mild atelectasis along the right hemidiaphragm. Electronically Signed   By: Van Clines M.D.   On: 02/11/2019 12:41   US Venous Img Lower Bilateral  Result Date: 01/30/2019 CLINICAL DATA:  Acute bilateral pulmonary embolism. EXAM: BILATERAL LOWER EXTREMITY VENOUS DOPPLER ULTRASOUND TECHNIQUE: Gray-scale sonography with graded compression, as well as color Doppler and duplex ultrasound were performed to evaluate the lower extremity deep venous systems from the level of the common femoral vein and including the common femoral, femoral, profunda femoral, popliteal and calf veins including the posterior tibial, peroneal and gastrocnemius veins when  visible. The superficial great saphenous vein was also interrogated. Spectral Doppler was utilized to evaluate flow at rest and with distal augmentation maneuvers in the common femoral, femoral and popliteal veins. COMPARISON:  None. FINDINGS: RIGHT LOWER EXTREMITY Common Femoral Vein: No evidence of thrombus. Normal compressibility, respiratory phasicity and response to augmentation. Saphenofemoral Junction: No evidence of thrombus. Normal compressibility and flow on color Doppler imaging. Profunda Femoral Vein: No evidence of thrombus. Normal compressibility and flow on color Doppler imaging. Femoral Vein: No evidence of thrombus. Normal compressibility, respiratory phasicity and response to augmentation. Popliteal Vein: No evidence of thrombus. Normal compressibility, respiratory phasicity and response to augmentation. Calf Veins: No evidence of thrombus. Normal compressibility and flow on color Doppler imaging.  Superficial Great Saphenous Vein: No evidence of thrombus. Normal compressibility. Venous Reflux:  None. Other Findings: No evidence of superficial thrombophlebitis or abnormal fluid collection. LEFT LOWER EXTREMITY Common Femoral Vein: Acute thrombus in the left common femoral vein occupies much of the lumen but is not completely occlusive with flow present. Saphenofemoral Junction: Some of the common femoral venous clot does extend into the saphenofemoral junction and GSV. Profunda Femoral Vein: No evidence of thrombus. Normal compressibility and flow on color Doppler imaging. Femoral Vein: Acute thrombus in the proximal to mid femoral vein of the thigh without complete occlusion. Popliteal Vein: No evidence of thrombus. Normal compressibility, respiratory phasicity and response to augmentation. Calf Veins: No evidence of thrombus. Normal compressibility and flow on color Doppler imaging. Superficial Great Saphenous Vein: Upper thigh segment of the GSV contains some thrombus at the level of the  saphenofemoral junction. Venous Reflux:  None. Other Findings:  None. IMPRESSION: 1. DVT of the left lower extremity with nonocclusive thrombus in the left common femoral vein and femoral vein. Some of the common femoral venous thrombus does extend just into the GSV across the SFJ. 2. No evidence of right lower extremity DVT. Electronically Signed   By: Aletta Edouard M.D.   On: 01/30/2019 09:52   Ct Pancreas Abd W/wo  Result Date: 01/30/2019 CLINICAL DATA:  Pulmonary embolism. Chest CT demonstrating soft tissue fullness in the region of the pancreas. EXAM: CT ABDOMEN WITHOUT AND WITH CONTRAST TECHNIQUE: Multidetector CT imaging of the abdomen was performed following the standard protocol before and following the bolus administration of intravenous contrast. CONTRAST:  162m OMNIPAQUE IOHEXOL 300 MG/ML  SOLN COMPARISON:  CTA chest of 1 day prior. Abdominopelvic CT 07/15/2013. FINDINGS: Lower chest: Moderate right hemidiaphragm elevation. Bilateral pulmonary emboli, as detailed on prior dedicated CT. Normal heart size without pericardial or pleural effusion. Hepatobiliary: Normal liver. Normal gallbladder, without biliary ductal dilatation. Pancreas: The pancreas is compressed by the peripancreatic and diffuse retroperitoneal abdominal process, but enhances normally. No pancreatic duct dilatation. Spleen: Normal in size, without focal abnormality. Adrenals/Urinary Tract: Normal adrenal glands. No renal calculi. Although the abdominal mass surrounds the anterior and medial interpolar left kidney, extending into the left renal pelvis on 38/14, there is only minimal left-sided caliectasis. Too small to characterize lesions in the left kidney. Normal right kidney. Stomach/Bowel: Normal stomach. The transverse duodenum is compressed by the abdominal mass, but is not obstructed. Normal colon. Vascular/Lymphatic: Normal aortic caliber. Compression of celiac, SMA, portal, splenic veins without thrombus. Centered within  the peripancreatic space of the abdominal retroperitoneum and contiguous small-bowel mesentery is a dominant nodal mass, including at on the order of 16.6 x 8.5 cm on 30/2/14. Concurrent abdominal retroperitoneal adenopathy, with a left periaortic node measuring 1.4 cm on 84/8. Gastrohepatic ligament index node measures 1.2 cm on 47/8. Left abdominal omental nodule of 9 mm on 37/14. Midline abdominal wall nodularity, including at 1.7 cm on 56/14. Other: No significant free fluid. Musculoskeletal: No acute osseous abnormality. IMPRESSION: 1. Findings most consistent with abdominal lymphoma. Dominant abdominal mass with retroperitoneal adenopathy and omental nodularity. 2. Mild left-sided caliectasis, secondary to presumed lymphomatous extension into the left renal pelvis. 3. Bilateral pulmonary emboli, as on chest CT. Electronically Signed   By: KAbigail MiyamotoM.D.   On: 01/30/2019 16:28   Vas [place] Extremity Venous (dvt)  Result Date: 02/07/2019  Lower Venous Study Other Indications: Follow-up DVT. Risk Factors: DVT 01/30/2019:Lt LE DVT. Comparison Study: 01/30/2019 Performing Technologist: SAlmira CoasterRVS  Examination Guidelines: A complete evaluation includes B-mode imaging, spectral Doppler, color Doppler, and power Doppler as needed of all accessible portions of each vessel. Bilateral testing is considered an integral part of a complete examination. Limited examinations for reoccurring indications may be performed as noted.  +-----+---------------+---------+-----------+----------+--------------+ RIGHTCompressibilityPhasicitySpontaneityPropertiesThrombus Aging +-----+---------------+---------+-----------+----------+--------------+ CFV  Full           Yes      Yes                                 +-----+---------------+---------+-----------+----------+--------------+   +---------+---------------+---------+-----------+----------+--------------+ LEFT      CompressibilityPhasicitySpontaneityPropertiesThrombus Aging +---------+---------------+---------+-----------+----------+--------------+ CFV      Partial        Yes      Yes                                 +---------+---------------+---------+-----------+----------+--------------+ SFJ      Full           Yes      Yes                                 +---------+---------------+---------+-----------+----------+--------------+ FV Prox  Full           Yes      Yes                                 +---------+---------------+---------+-----------+----------+--------------+ FV Mid   Partial        Yes      Yes                                 +---------+---------------+---------+-----------+----------+--------------+ FV DistalFull           Yes      Yes                                 +---------+---------------+---------+-----------+----------+--------------+ PFV      Full           Yes      Yes                                 +---------+---------------+---------+-----------+----------+--------------+ POP      Full           Yes      Yes                                 +---------+---------------+---------+-----------+----------+--------------+ PTV      Full           Yes      Yes                                 +---------+---------------+---------+-----------+----------+--------------+ PERO     Full           Yes      Yes                                 +---------+---------------+---------+-----------+----------+--------------+  GSV      Full           Yes      Yes                                 +---------+---------------+---------+-----------+----------+--------------+ Paired Veins seen in the SFV Prox/Mid through Mid segment; One(1) of them displaying signs of An Acute Process of DVT.    Summary: Left: Findings consistent with acute deep vein thrombosis involving the left femoral vein. Findings consistent with chronic deep vein thrombosis involving  the left common femoral vein. There is no evidence of superficial venous thrombosis.There is no evidence of chronic venous insufficiency.  *See table(s) above for measurements and observations. Electronically signed by Hortencia Pilar MD on 02/07/2019 at 4:42:55 PM.    Final      Assessment and plan 1. Follicular lymphoma of intra-abdominal lymph nodes, unspecified follicular lymphoma type ([place])   2. Other acute pulmonary embolism with acute cor pulmonale ([place])   3. Goals of care, counseling/discussion    Stage III follicular lymphoma PET image was independently reviewed by me and discussed with patient. Also discussed with him about pathology which is consistent with follicular lymphoma.  Grade 1-2/3 Recommend patient to proceed with bone marrow biopsy for additional staging.  Negative marrow signal on PET scan. The diagnosis of follicular lymphoma and care plan were discussed with patient in detail.  NCCN guidelines were reviewed and shared with patient.  Patient understands that follicular lymphoma is hard to cure but very treatable.  Given that he has bulky disease and has caused extensive bilateral PE, he will need treatments, plan BR x 6 cycles.. The goal of treatment which is to palliate disease, disease related symptoms, improve quality of life and hopefully prolong life was highlighted in our discussion.  Chemotherapy education was provided.  We had discussed the composition of chemotherapy regimen, length of chemo cycle, duration of treatment and the time to assess response to treatment.    I explained to the patient the risks and benefits of chemotherapy bendamustine including all but not limited to hair loss, mouth sore, nausea, vomiting, diarrhea, low blood counts, bleeding, neuropathy and risk of life threatening infection and even death, secondary malignancy etc.  . Patient voices understanding and willing to proceed chemotherapy.  Rituximab side effects including but not limited to,  infusion related reactions, new cutaneous reaction, reactivation of hepatitis, progressive multifocal leukoencephalopathy were all discussed with patient and he agrees with treatments. Check CBC, CMP, hepatitis panel, LDH. Given that patient has bulky disease to start with, will start patient on allopurinol 300 mg daily.  # Chemotherapy education; patient declines Medi- port placement. Antiemetics-Zofran and Compazine;   #Extensive bilateral pulmonary embolism, currently on Lovenox 1 mg/kg twice daily.  Tolerating well.  No bleeding events.  Recommend patient to continue Lovenox 1 mg/kg twice daily for now.  Wait until after bone marrow biopsy/next visit to switch to Eliquis 5 mg twice daily.  Orders Placed This Encounter  Procedures  . CT BONE MARROW BIOPSY & ASPIRATION    Standing Status:   Future    Standing Expiration Date:   05/16/2020    Order Specific Question:   Reason for Exam (SYMPTOM  OR DIAGNOSIS REQUIRED)    Answer:   follicular lympohoma    Order Specific Question:   Preferred location?    Answer:   [facility]    Order Specific Question:  Radiology Contrast Protocol - do NOT remove file path    Answer:   _[email or web address]  . CBC with Differential/Platelet    Standing Status:   Future    Number of Occurrences:   1    Standing Expiration Date:   02/14/2020  . Comprehensive metabolic panel    Standing Status:   Future    Number of Occurrences:   1    Standing Expiration Date:   02/14/2020  . Lactate dehydrogenase    Standing Status:   Future    Number of Occurrences:   1    Standing Expiration Date:   02/14/2020  . Hepatitis panel, acute    Standing Status:   Future    Number of Occurrences:   1    Standing Expiration Date:   02/14/2020     Earlie Server, MD, PhD Hematology Oncology [facility] at [facility] Pager- [phone] 02/14/2019

## 2019-02-15 ENCOUNTER — Other Ambulatory Visit: Payer: Self-pay

## 2019-02-15 ENCOUNTER — Encounter: Payer: Self-pay | Admitting: Oncology

## 2019-02-15 DIAGNOSIS — Z7189 Other specified counseling: Secondary | ICD-10-CM | POA: Insufficient documentation

## 2019-02-15 DIAGNOSIS — C8299 Follicular lymphoma, unspecified, extranodal and solid organ sites: Secondary | ICD-10-CM

## 2019-02-15 DIAGNOSIS — C8293 Follicular lymphoma, unspecified, intra-abdominal lymph nodes: Secondary | ICD-10-CM

## 2019-02-15 DIAGNOSIS — C8290 Follicular lymphoma, unspecified, unspecified site: Secondary | ICD-10-CM | POA: Insufficient documentation

## 2019-02-15 HISTORY — DX: Follicular lymphoma, unspecified, intra-abdominal lymph nodes: C82.93

## 2019-02-15 MED ORDER — ALLOPURINOL 300 MG PO TABS: 300.0000 mg | ORAL_TABLET | Freq: Every day | ORAL | 3 refills | Status: DC

## 2019-02-15 MED ORDER — PROCHLORPERAZINE MALEATE 10 MG PO TABS: 10.0000 mg | ORAL_TABLET | Freq: Four times a day (QID) | ORAL | 1 refills | Status: AC | PRN

## 2019-02-15 NOTE — Progress Notes (Signed)
START ON PATHWAY REGIMEN - Lymphoma and CLL     A cycle is every 28 days:     Bendamustine      Rituximab-xxxx   **Always confirm dose/schedule in your pharmacy ordering system**  Patient Characteristics: Follicular Lymphoma, Grades 1, 2, and 3A, First Line, Stage III / IV, Symptomatic or Bulky Disease Disease Type: Follicular Lymphoma, Grade 1, 2, or 3A Disease Type: Not Applicable Disease Type: Not Applicable [place] Stage: III Line of Therapy: First Line Disease Characteristics: Symptomatic or Bulky Disease Intent of Therapy: Non-Curative / Palliative Intent, Discussed with Patient 

## 2019-02-18 NOTE — Patient Instructions (Signed)
Rituximab injection What is this medicine? RITUXIMAB (ri TUX i mab) is a monoclonal antibody. It is used to treat certain types of cancer like non-Hodgkin lymphoma and chronic lymphocytic leukemia. It is also used to treat rheumatoid arthritis, granulomatosis with polyangiitis (or Wegener's granulomatosis), microscopic polyangiitis, and pemphigus vulgaris. This medicine may be used for other purposes; ask your health care provider or pharmacist if you have questions. COMMON BRAND NAME(S): Rituxan, RUXIENCE What should I tell my health care provider before I take this medicine? They need to know if you have any of these conditions:  heart disease  infection (especially a virus infection such as hepatitis B, chickenpox, cold sores, or herpes)  immune system problems  irregular heartbeat  kidney disease  low blood counts, like low white cell, platelet, or red cell counts  lung or breathing disease, like asthma  recently received or scheduled to receive a vaccine  an unusual or allergic reaction to rituximab, other medicines, foods, dyes, or preservatives  pregnant or trying to get pregnant  breast-feeding How should I use this medicine? This medicine is for infusion into a vein. It is administered in a hospital or clinic by a specially trained health care professional. A special MedGuide will be given to you by the pharmacist with each prescription and refill. Be sure to read this information carefully each time. Talk to your pediatrician regarding the use of this medicine in children. This medicine is not approved for use in children. Overdosage: If you think you have taken too much of this medicine contact a poison control center or emergency room at once. NOTE: This medicine is only for you. Do not share this medicine with others. What if I miss a dose? It is important not to miss a dose. Call your doctor or health care professional if you are unable to keep an appointment. What  may interact with this medicine?  cisplatin  live virus vaccines This list may not describe all possible interactions. Give your health care provider a list of all the medicines, herbs, non-prescription drugs, or dietary supplements you use. Also tell them if you smoke, drink alcohol, or use illegal drugs. Some items may interact with your medicine. What should I watch for while using this medicine? Your condition will be monitored carefully while you are receiving this medicine. You may need blood work done while you are taking this medicine. This medicine can cause serious allergic reactions. To reduce your risk you may need to take medicine before treatment with this medicine. Take your medicine as directed. In some patients, this medicine may cause a serious brain infection that may cause death. If you have any problems seeing, thinking, speaking, walking, or standing, tell your healthcare professional right away. If you cannot reach your healthcare professional, urgently seek other source of medical care. Call your doctor or health care professional for advice if you get a fever, chills or sore throat, or other symptoms of a cold or flu. Do not treat yourself. This drug decreases your body's ability to fight infections. Try to avoid being around people who are sick. Do not become pregnant while taking this medicine or for at least 12 months after stopping it. Women should inform their doctor if they wish to become pregnant or think they might be pregnant. There is a potential for serious side effects to an unborn child. Talk to your health care professional or pharmacist for more information. Do not breast-feed an infant while taking this medicine or for at   least 6 months after stopping it. What side effects may I notice from receiving this medicine? Side effects that you should report to your doctor or health care professional as soon as possible:  allergic reactions like skin rash, itching or  hives; swelling of the face, lips, or tongue  breathing problems  chest pain  changes in vision  diarrhea  headache with fever, neck stiffness, sensitivity to light, nausea, or confusion  fast, irregular heartbeat  loss of memory  low blood counts - this medicine may decrease the number of white blood cells, red blood cells and platelets. You may be at increased risk for infections and bleeding.  mouth sores  problems with balance, talking, or walking  redness, blistering, peeling or loosening of the skin, including inside the mouth  signs of infection - fever or chills, cough, sore throat, pain or difficulty passing urine  signs and symptoms of kidney injury like trouble passing urine or change in the amount of urine  signs and symptoms of liver injury like dark yellow or brown urine; general ill feeling or flu-like symptoms; light-colored stools; loss of appetite; nausea; right upper belly pain; unusually weak or tired; yellowing of the eyes or skin  signs and symptoms of low blood pressure like dizziness; feeling faint or lightheaded, falls; unusually weak or tired  stomach pain  swelling of the ankles, feet, hands  unusual bleeding or bruising  vomiting Side effects that usually do not require medical attention (report to your doctor or health care professional if they continue or are bothersome):  headache  joint pain  muscle cramps or muscle pain  nausea  tiredness This list may not describe all possible side effects. Call your doctor for medical advice about side effects. You may report side effects to FDA at 1-800-FDA-1088. Where should I keep my medicine? This drug is given in a hospital or clinic and will not be stored at home. NOTE: This sheet is a summary. It may not cover all possible information. If you have questions about this medicine, talk to your doctor, pharmacist, or health care provider.  2020 Elsevier/Gold Standard (2018-05-30  22:01:36) Bendamustine Injection What is this medicine? BENDAMUSTINE (BEN da MUS teen) is a chemotherapy drug. It is used to treat chronic lymphocytic leukemia and non-Hodgkin lymphoma. This medicine may be used for other purposes; ask your health care provider or pharmacist if you have questions. COMMON BRAND NAME(S): BELRAPZO, BENDEKA, Treanda What should I tell my health care provider before I take this medicine? They need to know if you have any of these conditions:  infection (especially a virus infection such as chickenpox, cold sores, or herpes)  kidney disease  liver disease  an unusual or allergic reaction to bendamustine, mannitol, other medicines, foods, dyes, or preservatives  pregnant or trying to get pregnant  breast-feeding How should I use this medicine? This medicine is for infusion into a vein. It is given by a health care professional in a hospital or clinic setting. Talk to your pediatrician regarding the use of this medicine in children. Special care may be needed. Overdosage: If you think you have taken too much of this medicine contact a poison control center or emergency room at once. NOTE: This medicine is only for you. Do not share this medicine with others. What if I miss a dose? It is important not to miss your dose. Call your doctor or health care professional if you are unable to keep an appointment. What may interact with this   medicine? Do not take this medicine with any of the following medications:  clozapine This medicine may also interact with the following medications:  atazanavir  cimetidine  ciprofloxacin  enoxacin  fluvoxamine  medicines for seizures like carbamazepine and phenobarbital  mexiletine  rifampin  tacrine  thiabendazole  zileuton This list may not describe all possible interactions. Give your health care provider a list of all the medicines, herbs, non-prescription drugs, or dietary supplements you use. Also tell  them if you smoke, drink alcohol, or use illegal drugs. Some items may interact with your medicine. What should I watch for while using this medicine? This drug may make you feel generally unwell. This is not uncommon, as chemotherapy can affect healthy cells as well as cancer cells. Report any side effects. Continue your course of treatment even though you feel ill unless your doctor tells you to stop. You may need blood work done while you are taking this medicine. Call your doctor or healthcare provider for advice if you get a fever, chills or sore throat, or other symptoms of a cold or flu. Do not treat yourself. This drug decreases your body's ability to fight infections. Try to avoid being around people who are sick. This medicine may cause serious skin reactions. They can happen weeks to months after starting the medicine. Contact your healthcare provider right away if you notice fevers or flu-like symptoms with a rash. The rash may be red or purple and then turn into blisters or peeling of the skin. Or, you might notice a red rash with swelling of the face, lips or lymph nodes in your neck or under your arms. This medicine may increase your risk to bruise or bleed. Call your doctor or healthcare provider if you notice any unusual bleeding. Talk to your doctor about your risk of cancer. You may be more at risk for certain types of cancers if you take this medicine. Do not become pregnant while taking this medicine or for at least 6 months after stopping it. Women should inform their doctor if they wish to become pregnant or think they might be pregnant. Men should not father a child while taking this medicine and for at least 3 months after stopping it. There is a potential for serious side effects to an unborn child. Talk to your healthcare provider or pharmacist for more information. Do not breast-feed an infant while taking this medicine or for at least 1 week after stopping it. This medicine may  make it more difficult to father a child. You should talk with your doctor or healthcare provider if you are concerned about your fertility. What side effects may I notice from receiving this medicine? Side effects that you should report to your doctor or health care professional as soon as possible:  allergic reactions like skin rash, itching or hives, swelling of the face, lips, or tongue  low blood counts - this medicine may decrease the number of white blood cells, red blood cells and platelets. You may be at increased risk for infections and bleeding.  rash, fever, and swollen lymph nodes  redness, blistering, peeling, or loosening of the skin, including inside the mouth  signs of infection like fever or chills, cough, sore throat, pain or difficulty passing urine  signs of decreased platelets or bleeding like bruising, pinpoint red spots on the skin, black, tarry stools, blood in the urine  signs of decreased red blood cells like being unusually weak or tired, fainting spells, lightheadedness  signs   and symptoms of kidney injury like trouble passing urine or change in the amount of urine  signs and symptoms of liver injury like dark yellow or brown urine; general ill feeling or flu-like symptoms; light-colored stools; loss of appetite; nausea; right upper belly pain; unusually weak or tired; yellowing of the eyes or skin Side effects that usually do not require medical attention (report to your doctor or health care professional if they continue or are bothersome):  constipation  decreased appetite  diarrhea  headache  mouth sores  nausea, vomiting  tiredness This list may not describe all possible side effects. Call your doctor for medical advice about side effects. You may report side effects to FDA at 1-800-FDA-1088. Where should I keep my medicine? This drug is given in a hospital or clinic and will not be stored at home. NOTE: This sheet is a summary. It may not  cover all possible information. If you have questions about this medicine, talk to your doctor, pharmacist, or health care provider.  2020 Elsevier/Gold Standard (2018-07-10 10:26:46)  

## 2019-02-19 ENCOUNTER — Inpatient Hospital Stay (HOSPITAL_BASED_OUTPATIENT_CLINIC_OR_DEPARTMENT_OTHER): Payer: BC Managed Care – PPO | Admitting: Oncology

## 2019-02-19 ENCOUNTER — Other Ambulatory Visit: Payer: BC Managed Care – PPO

## 2019-02-19 ENCOUNTER — Ambulatory Visit
Admission: RE | Admit: 2019-02-19 | Discharge: 2019-02-19 | Disposition: A | Discharge status: Home / Self Care | Payer: BC Managed Care – PPO | Source: Ambulatory Visit | Attending: Oncology | Admitting: Oncology

## 2019-02-19 ENCOUNTER — Encounter: Payer: Self-pay | Admitting: Oncology

## 2019-02-19 ENCOUNTER — Inpatient Hospital Stay: Payer: BC Managed Care – PPO

## 2019-02-19 ENCOUNTER — Encounter
Admission: RE | Admit: 2019-02-19 | Discharge: 2019-02-19 | Disposition: A | Discharge status: Home / Self Care | Payer: BC Managed Care – PPO | Source: Ambulatory Visit | Attending: Oncology | Admitting: Oncology

## 2019-02-19 ENCOUNTER — Other Ambulatory Visit: Payer: Self-pay

## 2019-02-19 DIAGNOSIS — M25511 Pain in right shoulder: Secondary | ICD-10-CM

## 2019-02-19 DIAGNOSIS — Z20828 Contact with and (suspected) exposure to other viral communicable diseases: Secondary | ICD-10-CM | POA: Insufficient documentation

## 2019-02-19 DIAGNOSIS — C8299 Follicular lymphoma, unspecified, extranodal and solid organ sites: Secondary | ICD-10-CM

## 2019-02-19 DIAGNOSIS — C8293 Follicular lymphoma, unspecified, intra-abdominal lymph nodes: Secondary | ICD-10-CM | POA: Diagnosis not present

## 2019-02-19 DIAGNOSIS — Z01812 Encounter for preprocedural laboratory examination: Secondary | ICD-10-CM | POA: Insufficient documentation

## 2019-02-19 DIAGNOSIS — M19011 Primary osteoarthritis, right shoulder: Secondary | ICD-10-CM | POA: Diagnosis not present

## 2019-02-19 LAB — SARS CORONAVIRUS 2 (TAT 6-24 HRS): SARS Coronavirus 2: NEGATIVE

## 2019-02-19 NOTE — Progress Notes (Signed)
[place]  Telephone:([phone] [phone]  Patient Care Team: Rusty Aus, MD as PCP - General (Internal Medicine)   Name of the patient: Edward Atkinson  QG:[id number]  1960/12/12   Date of visit: [id number]  Diagnosis-follicular cell lymphoma  Chief complaint/Reason for visit- Initial Meeting for [facility], preparing for starting chemotherapy  Heme/Onc history:  Oncology History  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[place])  02/06/2019 Initial Diagnosis   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[place])   02/28/2019 -  Chemotherapy   The patient had palonosetron (ALOXI) injection 0.25 mg, 0.25 mg, Intravenous,  Once, 0 of 6 cycles bendamustine (BENDEKA) 200 mg in sodium chloride 0.9 % 50 mL (3.4483 mg/mL) chemo infusion, 90 mg/m2, Intravenous,  Once, 0 of 6 cycles riTUXimab-pvvr (RUXIENCE) 800 mg in sodium chloride 0.9 % 250 mL (2.4242 mg/mL) infusion, 375 mg/m2, Intravenous,  Once, 0 of 6 cycles  for chemotherapy treatment.    Follicular lymphoma of intra-abdominal lymph nodes ([place])  02/15/2019 Initial Diagnosis   Follicular lymphoma of intra-abdominal lymph nodes ([place])   02/28/2019 -  Chemotherapy   The patient had palonosetron (ALOXI) injection 0.25 mg, 0.25 mg, Intravenous,  Once, 0 of 6 cycles bendamustine (BENDEKA) 200 mg in sodium chloride 0.9 % 50 mL (3.4483 mg/mL) chemo infusion, 90 mg/m2, Intravenous,  Once, 0 of 6 cycles riTUXimab-pvvr (RUXIENCE) 800 mg in sodium chloride 0.9 % 250 mL (2.4242 mg/mL) infusion, 375 mg/m2, Intravenous,  Once, 0 of 6 cycles  for chemotherapy treatment.      Interval history-[name] is a 58 year old male who presents to chemo care clinic today for initial meeting in preparation for starting chemotherapy. I introduced the chemo care clinic and we discussed that the role of the clinic is to assist those who are at an increased risk of emergency room visits and/or  complications during the course of chemotherapy treatment. We discussed that the increased risk takes into account factors such as age, performance status, and co-morbidities. We also discussed that for some, this might include barriers to care such as not having a primary care provider, lack of insurance/transportation, or not being able to afford medications. We discussed that the goal of the program is to help prevent unplanned ER visits and help reduce complications during chemotherapy. We do this by discussing specific risk factors to each individual and identifying ways that we can help improve these risk factors and reduce barriers to care.   ECOG FS:1 - Symptomatic but completely ambulatory  Review of systems- Review of Systems  Constitutional: Positive for malaise/fatigue.  Musculoskeletal:       Right shoulder pain X 1 week ago  Psychiatric/Behavioral: The patient is nervous/anxious.      Current treatment-scheduled to begin treatment next week; bendamustine days 1 and 2 and Rituxan day 1 every 28 days  Allergies  Allergen Reactions  . Penicillins Rash    Past Medical History:  Diagnosis Date  . Diverticulitis   . Follicular lymphoma of intra-abdominal lymph nodes ([place]) 02/15/2019    Past Surgical History:  Procedure Laterality Date  . CHOLECYSTECTOMY    . COLON RESECTION    . PULMONARY THROMBECTOMY N/A 01/29/2019   Procedure: PULMONARY THROMBECTOMY;  Surgeon: Algernon Huxley, MD;  Location: [facility];  Service: Cardiovascular;  Laterality: N/A;    Social History   Socioeconomic History  . Marital status: Married    Spouse name: Not on file  . Number  of children: Not on file  . Years of education: Not on file  . Highest education level: Not on file  Occupational History  . Not on file  Social Needs  . Financial resource strain: Not on file  . Food insecurity    Worry: Not on file    Inability: Not on file  . Transportation needs    Medical: Not on file     Non-medical: Not on file  Tobacco Use  . Smoking status: Never Smoker  . Smokeless tobacco: Never Used  Substance and Sexual Activity  . Alcohol use: Yes    Comment: occasional  . Drug use: Never  . Sexual activity: Not on file  Lifestyle  . Physical activity    Days per week: Not on file    Minutes per session: Not on file  . Stress: Not on file  Relationships  . Social Herbalist on phone: Not on file    Gets together: Not on file    Attends religious service: Not on file    Active member of club or organization: Not on file    Attends meetings of clubs or organizations: Not on file    Relationship status: Not on file  . Intimate partner violence    Fear of current or ex partner: Not on file    Emotionally abused: Not on file    Physically abused: Not on file    Forced sexual activity: Not on file  Other Topics Concern  . Not on file  Social History Narrative  . Not on file    No family history on file.   Current Outpatient Medications:  .  allopurinol (ZYLOPRIM) 300 MG tablet, Take 1 tablet (300 mg total) by mouth daily., Disp: 30 tablet, Rfl: 3 .  apixaban (ELIQUIS) 5 MG TABS tablet, Take 1 tablet (5 mg total) by mouth 2 (two) times daily., Disp: 60 tablet, Rfl: 11 .  enoxaparin (LOVENOX) 100 MG/ML injection, Inject 1 mL (100 mg total) into the skin every 12 (twelve) hours., Disp: 40 mL, Rfl: 1 .  glipiZIDE (GLUCOTROL XL) 10 MG 24 hr tablet, Take 1 tablet (10 mg total) by mouth daily with breakfast., Disp: 30 tablet, Rfl: 1 .  guaiFENesin-dextromethorphan (ROBITUSSIN DM) 100-10 MG/5ML syrup, Take 5 mLs by mouth every 4 (four) hours as needed for cough. (Patient not taking: Reported on 02/14/2019), Disp: 118 mL, Rfl: 0 .  prochlorperazine (COMPAZINE) 10 MG tablet, Take 1 tablet (10 mg total) by mouth every 6 (six) hours as needed (Nausea or vomiting)., Disp: 30 tablet, Rfl: 1  Physical exam: There were no vitals filed for this visit. Physical  Exam Constitutional:      Appearance: Normal appearance.  HENT:     Head: Normocephalic and atraumatic.  Eyes:     Pupils: Pupils are equal, round, and reactive to light.  Neck:     Musculoskeletal: Normal range of motion.  Cardiovascular:     Rate and Rhythm: Normal rate and regular rhythm.     Heart sounds: Normal heart sounds. No murmur.  Pulmonary:     Effort: Pulmonary effort is normal.     Breath sounds: Normal breath sounds. No wheezing.  Abdominal:     General: Bowel sounds are normal. There is no distension.     Palpations: Abdomen is soft.     Tenderness: There is no abdominal tenderness.  Musculoskeletal: Normal range of motion.        General: Tenderness present.  Arms:  Skin:    General: Skin is warm and dry.     Findings: No rash.  Neurological:     Mental Status: He is alert and oriented to person, place, and time.  Psychiatric:        Judgment: Judgment normal.      CMP Latest Ref Rng & Units 02/14/2019  Glucose 70 - 99 mg/dL 128(H)  BUN 6 - 20 mg/dL 19  Creatinine 0.61 - 1.24 mg/dL 1.11  Sodium 135 - 145 mmol/L 135  Potassium 3.5 - 5.1 mmol/L 4.4  Chloride 98 - 111 mmol/L 102  CO2 22 - 32 mmol/L 23  Calcium 8.9 - 10.3 mg/dL 9.6  Total Protein 6.5 - 8.1 g/dL 7.6  Total Bilirubin 0.3 - 1.2 mg/dL 0.6  Alkaline Phos 38 - 126 U/L 56  AST 15 - 41 U/L 19  ALT 0 - 44 U/L 20   CBC Latest Ref Rng & Units 02/14/2019  WBC 4.0 - 10.5 K/uL 7.3  Hemoglobin 13.0 - 17.0 g/dL 15.5  Hematocrit 39.0 - 52.0 % 47.0  Platelets 150 - 400 K/uL 248    No images are attached to the encounter.  Dg Chest 2 View  Result Date: 01/29/2019 CLINICAL DATA:  Shortness of breath. EXAM: CHEST - 2 VIEW COMPARISON:  None. FINDINGS: The cardiomediastinal silhouette is within normal limits. The lungs are hypoinflated with mild-to-moderate elevation of the right hemidiaphragm. No airspace consolidation, edema, pleural effusion, pneumothorax is identified. There is mild anterior  wedging of 2 adjacent thoracic vertebral bodies, approximately T7 and T8. IMPRESSION: 1. Hypoinflation and right hemidiaphragm elevation without evidence of pneumonia or edema. 2. Mild anterior wedging of 2 adjacent mid thoracic vertebral bodies, age indeterminate. Electronically Signed   By: Logan Bores M.D.   On: 01/29/2019 12:43   Dg Shoulder Right  Result Date: 02/19/2019 CLINICAL DATA:  Right arm and shoulder pain.  No known injury. EXAM: RIGHT SHOULDER - 2+ VIEW COMPARISON:  None FINDINGS: Degenerative changes in the [facility] joint with joint space narrowing and spurring. Glenohumeral joint is maintained. No acute bony abnormality. Specifically, no fracture, subluxation, or dislocation. Soft tissues are intact. IMPRESSION: Degenerative changes in the right AC joint. No acute bony abnormality. Electronically Signed   By: Rolm Baptise M.D.   On: 02/19/2019 12:07   Ct Angio Chest Pe W And/or Wo Contrast  Result Date: 01/29/2019 CLINICAL DATA:  Shortness of breath and hypoxia. EXAM: CT ANGIOGRAPHY CHEST WITH CONTRAST TECHNIQUE: Multidetector CT imaging of the chest was performed using the standard protocol during bolus administration of intravenous contrast. Multiplanar CT image reconstructions and MIPs were obtained to evaluate the vascular anatomy. CONTRAST:  36mL OMNIPAQUE IOHEXOL 350 MG/ML SOLN COMPARISON:  Chest radiograph January 29, 2019 FINDINGS: Cardiovascular: Extensive pulmonary embolus arises from the proximal right main pulmonary artery extending peripherally with pulmonary embolus in multiple right upper and right middle lobe pulmonary artery branches. There is a lesser degree of pulmonary embolus extend into the right upper lobe pulmonary arteries, most notably posteriorly. There are multiple pulmonary emboli in the left lower lobe pulmonary arterial vessels. There is pulmonary embolus, incompletely obstructing in the distal most aspect of the left main pulmonary artery. The right ventricle to  left ventricle diameter ratio is 1.6, consistent with right heart strain. There is no demonstrable thoracic aortic aneurysm or dissection. The visualized great vessels appear unremarkable. There is no pericardial effusion or pericardial thickening. The main pulmonary outflow tract measures 3.5 cm in diameter. Mediastinum/Nodes: Visualized  thyroid appears normal. There is no appreciable thoracic adenopathy. No esophageal lesions are evident. Lungs/Pleura: There is atelectatic change in the right lower lobe. There may be mild superimposed pneumonia in this area. Lungs elsewhere are clear. No demonstrable pulmonary infarct or pleural effusion. Upper Abdomen: There is diffuse soft tissue fullness involving and surrounding the pancreas. This soft tissue fullness extends laterally on the left to the splenic hilum. There is concern for adenopathy in this region. Gallbladder absent. Visualized upper abdominal structures otherwise appear normal. Musculoskeletal: There is slight anterior wedging of the T8 vertebral body. No blastic or lytic bone lesions. No chest wall lesions evident. Review of the MIP images confirms the above findings. IMPRESSION: 1. Extensive pulmonary embolus bilaterally, somewhat more severe on the right than on the left. Positive for acute PE with CT evidence of right heart strain (RV/LV Ratio = 1.6) consistent with at least submassive (intermediate risk) PE. The presence of right heart strain has been associated with an increased risk of morbidity and mortality. Please activate Code PE by paging [phone]. 2. Enlargement of the main pulmonary outflow tract, a finding indicative of underlying pulmonary arterial hypertension. 3. Incomplete visualization of the pancreas/peripancreatic region. There is diffuse soft tissue fullness involving and potentially surrounding the pancreas. May be adenopathy in this area. Dedicated CT or MR of the abdomen with particular attention the pancreatic region advised  within patient able based on treatment for pulmonary embolus with right heart strain acutely. 4. Atelectasis right lower lobe with questionable mild superimposed pneumonia. 5.  No adenopathy evident in the thoracic region. 6.  Gallbladder absent. Critical Value/emergent results were called by telephone at the time of interpretation on 01/29/2019 at 3:03 pm to [place] , who verbally acknowledged these results. Electronically Signed   By: Lowella Grip III M.D.   On: 01/29/2019 15:04   Ct Guided Needle Placement  Result Date: 01/31/2019 INDICATION: No known primary, now with concern for abdominal lymphoma. Please perform CT-guided biopsy omental mass for tissue diagnostic purposes. EXAM: CT GUIDANCE NEEDLE PLACEMENT COMPARISON:  CT abdomen and pelvis-01/30/2019 MEDICATIONS: None. ANESTHESIA/SEDATION: Fentanyl 100 mcg IV; Versed 2 mg IV Sedation time: 17 minutes; The patient was continuously monitored during the procedure by the interventional radiology nurse under my direct supervision. CONTRAST:  None. COMPLICATIONS: None immediate. PROCEDURE: Informed consent was obtained from the patient following an explanation of the procedure, risks, benefits and alternatives. A time out was performed prior to the initiation of the procedure. The patient was positioned supine on the CT table and a limited CT was performed for procedural planning demonstrating unchanged size and appearance of omental/mesenteric mass with dominant component measuring approximately 16.2 x 9.1 cm (image 46, series 2). The procedure was planned. The operative site was prepped and draped in the usual sterile fashion. Appropriate trajectory was confirmed with a 22 gauge spinal needle after the adjacent tissues were anesthetized with 1% Lidocaine with epinephrine. Under intermittent CT guidance, a 17 gauge coaxial needle was advanced into the peripheral aspect of the mass. Appropriate positioning was confirmed and 8 core needle  biopsy samples were obtained with an 18 gauge core needle biopsy device. The co-axial needle was removed following the administration of a Gel-Foam slurry and superficial hemostasis was achieved with manual compression. A limited postprocedural CT was negative for hemorrhage or additional complication. A dressing was placed. The patient tolerated the procedure well without immediate postprocedural complication. IMPRESSION: Technically successful CT guided core needle biopsy of omental/mesenteric mass. Electronically Signed   By:  Sandi Mariscal M.D.   On: 01/31/2019 16:28   Nm Pet Image Initial (pi) Skull Base To Thigh  Result Date: 02/11/2019 CLINICAL DATA:  Initial treatment strategy for follicular lymphoma. EXAM: NUCLEAR MEDICINE PET SKULL BASE TO THIGH TECHNIQUE: 12.2 mCi F-18 FDG was injected intravenously. Full-ring PET imaging was performed from the skull base to thigh after the radiotracer. CT data was obtained and used for attenuation correction and anatomic localization. Fasting blood glucose: 134 mg/dl COMPARISON:  Multiple exams, including CT abdomen 01/30/2019 FINDINGS: Mediastinal blood pool activity: SUV max 2.1 Liver activity: SUV max 3.9 NECK: Minimal accentuated activity in the vicinity of the left medial pterygoid plate adjacent to a supernumerary tooth along the posterior maxilla. No corresponding CT abnormality, maximum SUV 5.5. This is likely incidental. Incidental CT findings: none CHEST: Left axillary node measuring 0.7 cm in short axis on image 84/3 has a maximum SUV of 3.3, Deauville 3. Incidental CT findings: Mild atelectasis along the right hemidiaphragm. ABDOMEN/PELVIS: Large conglomerate retroperitoneal and mesenteric mass, measuring about 18.3 by 8.7 cm, maximum SUV 10.6, compatible with Deauville 5 disease. This partially surrounds the pancreas, extends along the splenic hilum, and extends into the left perirenal space but in the left kidney. There are individually enlarged and  hypermetabolic pelvic lymph nodes especially on the right, including a right common iliac node measuring 1.4 cm in short axis on image 216/3 with maximum SUV 7.8 (Deauville 4), and a right external iliac node measuring 2.0 cm in short axis on image 243/3 with maximum SUV 5.4, Deauville 4. There is a small right inguinal focus of accentuated metabolic activity and stranding in the subcutaneous tissues, maximum SUV 4.1, without a well-defined lymph node. Incidental CT findings: Cholecystectomy. No hydronephrosis. The spleen measures 12.6 by 5.7 by 13.0 cm (volume = 490 cm^3), without focal hypermetabolic activity. Postoperative findings in the rectum. Punctate calcifications along small scrotal hydroceles bilaterally. SKELETON: No significant abnormal hypermetabolic activity in this region. Incidental CT findings: Chronic bilateral pars defects at L5 with grade 2 anterolisthesis. Mesoacromial os acromiale on the left. IMPRESSION: 1. The large conglomerate retroperitoneal and mesenteric mass of the maximum SUV of 10.6 compatible with Deauville 5 disease. There is Deauville 4 adenopathy in the pelvis. 2. A left axillary lymph node measuring 0.7 cm in short axis has a maximum SUV of 3.3, compatible with Deauville 3 disease. 3. Minimally accentuated activity in the vicinity of the left medial pterygoid plate, without corresponding CT abnormality, probably incidental. 4. Mild splenomegaly, without focal splenic lesion identified. 5. Chronic pars defects at L5 with grade 2 anterolisthesis. 6. Mild atelectasis along the right hemidiaphragm. Electronically Signed   By: Van Clines M.D.   On: 02/11/2019 12:41   US Venous Img Lower Bilateral  Result Date: 01/30/2019 CLINICAL DATA:  Acute bilateral pulmonary embolism. EXAM: BILATERAL LOWER EXTREMITY VENOUS DOPPLER ULTRASOUND TECHNIQUE: Gray-scale sonography with graded compression, as well as color Doppler and duplex ultrasound were performed to evaluate the lower  extremity deep venous systems from the level of the common femoral vein and including the common femoral, femoral, profunda femoral, popliteal and calf veins including the posterior tibial, peroneal and gastrocnemius veins when visible. The superficial great saphenous vein was also interrogated. Spectral Doppler was utilized to evaluate flow at rest and with distal augmentation maneuvers in the common femoral, femoral and popliteal veins. COMPARISON:  None. FINDINGS: RIGHT LOWER EXTREMITY Common Femoral Vein: No evidence of thrombus. Normal compressibility, respiratory phasicity and response to augmentation. Saphenofemoral Junction: No evidence  of thrombus. Normal compressibility and flow on color Doppler imaging. Profunda Femoral Vein: No evidence of thrombus. Normal compressibility and flow on color Doppler imaging. Femoral Vein: No evidence of thrombus. Normal compressibility, respiratory phasicity and response to augmentation. Popliteal Vein: No evidence of thrombus. Normal compressibility, respiratory phasicity and response to augmentation. Calf Veins: No evidence of thrombus. Normal compressibility and flow on color Doppler imaging. Superficial Great Saphenous Vein: No evidence of thrombus. Normal compressibility. Venous Reflux:  None. Other Findings: No evidence of superficial thrombophlebitis or abnormal fluid collection. LEFT LOWER EXTREMITY Common Femoral Vein: Acute thrombus in the left common femoral vein occupies much of the lumen but is not completely occlusive with flow present. Saphenofemoral Junction: Some of the common femoral venous clot does extend into the saphenofemoral junction and GSV. Profunda Femoral Vein: No evidence of thrombus. Normal compressibility and flow on color Doppler imaging. Femoral Vein: Acute thrombus in the proximal to mid femoral vein of the thigh without complete occlusion. Popliteal Vein: No evidence of thrombus. Normal compressibility, respiratory phasicity and response  to augmentation. Calf Veins: No evidence of thrombus. Normal compressibility and flow on color Doppler imaging. Superficial Great Saphenous Vein: Upper thigh segment of the GSV contains some thrombus at the level of the saphenofemoral junction. Venous Reflux:  None. Other Findings:  None. IMPRESSION: 1. DVT of the left lower extremity with nonocclusive thrombus in the left common femoral vein and femoral vein. Some of the common femoral venous thrombus does extend just into the GSV across the SFJ. 2. No evidence of right lower extremity DVT. Electronically Signed   By: Aletta Edouard M.D.   On: 01/30/2019 09:52   Ct Pancreas Abd W/wo  Result Date: 01/30/2019 CLINICAL DATA:  Pulmonary embolism. Chest CT demonstrating soft tissue fullness in the region of the pancreas. EXAM: CT ABDOMEN WITHOUT AND WITH CONTRAST TECHNIQUE: Multidetector CT imaging of the abdomen was performed following the standard protocol before and following the bolus administration of intravenous contrast. CONTRAST:  12mL OMNIPAQUE IOHEXOL 300 MG/ML  SOLN COMPARISON:  CTA chest of 1 day prior. Abdominopelvic CT 07/15/2013. FINDINGS: Lower chest: Moderate right hemidiaphragm elevation. Bilateral pulmonary emboli, as detailed on prior dedicated CT. Normal heart size without pericardial or pleural effusion. Hepatobiliary: Normal liver. Normal gallbladder, without biliary ductal dilatation. Pancreas: The pancreas is compressed by the peripancreatic and diffuse retroperitoneal abdominal process, but enhances normally. No pancreatic duct dilatation. Spleen: Normal in size, without focal abnormality. Adrenals/Urinary Tract: Normal adrenal glands. No renal calculi. Although the abdominal mass surrounds the anterior and medial interpolar left kidney, extending into the left renal pelvis on 38/14, there is only minimal left-sided caliectasis. Too small to characterize lesions in the left kidney. Normal right kidney. Stomach/Bowel: Normal stomach. The  transverse duodenum is compressed by the abdominal mass, but is not obstructed. Normal colon. Vascular/Lymphatic: Normal aortic caliber. Compression of celiac, SMA, portal, splenic veins without thrombus. Centered within the peripancreatic space of the abdominal retroperitoneum and contiguous small-bowel mesentery is a dominant nodal mass, including at on the order of 16.6 x 8.5 cm on 30/2/14. Concurrent abdominal retroperitoneal adenopathy, with a left periaortic node measuring 1.4 cm on 84/8. Gastrohepatic ligament index node measures 1.2 cm on 47/8. Left abdominal omental nodule of 9 mm on 37/14. Midline abdominal wall nodularity, including at 1.7 cm on 56/14. Other: No significant free fluid. Musculoskeletal: No acute osseous abnormality. IMPRESSION: 1. Findings most consistent with abdominal lymphoma. Dominant abdominal mass with retroperitoneal adenopathy and omental nodularity. 2. Mild left-sided caliectasis,  secondary to presumed lymphomatous extension into the left renal pelvis. 3. Bilateral pulmonary emboli, as on chest CT. Electronically Signed   By: Abigail Miyamoto M.D.   On: 01/30/2019 16:28   Vas [place] Lower Extremity Venous (dvt)  Result Date: 02/07/2019  Lower Venous Study Other Indications: Follow-up DVT. Risk Factors: DVT 01/30/2019:Lt LE DVT. Comparison Study: 01/30/2019 Performing Technologist: Almira Coaster RVS  Examination Guidelines: A complete evaluation includes B-mode imaging, spectral Doppler, color Doppler, and power Doppler as needed of all accessible portions of each vessel. Bilateral testing is considered an integral part of a complete examination. Limited examinations for reoccurring indications may be performed as noted.  +-----+---------------+---------+-----------+----------+--------------+ RIGHTCompressibilityPhasicitySpontaneityPropertiesThrombus Aging +-----+---------------+---------+-----------+----------+--------------+ CFV  Full           Yes      Yes                                  +-----+---------------+---------+-----------+----------+--------------+   +---------+---------------+---------+-----------+----------+--------------+ LEFT     CompressibilityPhasicitySpontaneityPropertiesThrombus Aging +---------+---------------+---------+-----------+----------+--------------+ CFV      Partial        Yes      Yes                                 +---------+---------------+---------+-----------+----------+--------------+ SFJ      Full           Yes      Yes                                 +---------+---------------+---------+-----------+----------+--------------+ FV Prox  Full           Yes      Yes                                 +---------+---------------+---------+-----------+----------+--------------+ FV Mid   Partial        Yes      Yes                                 +---------+---------------+---------+-----------+----------+--------------+ FV DistalFull           Yes      Yes                                 +---------+---------------+---------+-----------+----------+--------------+ PFV      Full           Yes      Yes                                 +---------+---------------+---------+-----------+----------+--------------+ POP      Full           Yes      Yes                                 +---------+---------------+---------+-----------+----------+--------------+ PTV      Full           Yes      Yes                                 +---------+---------------+---------+-----------+----------+--------------+  PERO     Full           Yes      Yes                                 +---------+---------------+---------+-----------+----------+--------------+ GSV      Full           Yes      Yes                                 +---------+---------------+---------+-----------+----------+--------------+ Paired Veins seen in the SFV Prox/Mid through Mid segment; One(1) of them displaying signs of An Acute  Process of DVT.    Summary: Left: Findings consistent with acute deep vein thrombosis involving the left femoral vein. Findings consistent with chronic deep vein thrombosis involving the left common femoral vein. There is no evidence of superficial venous thrombosis.There is no evidence of chronic venous insufficiency.  *See table(s) above for measurements and observations. Electronically signed by Hortencia Pilar MD on 02/07/2019 at 4:42:55 PM.    Final      Assessment and plan- Patient is a 58 y.o. male who presents to [facility] for initial meeting in preparation for starting chemotherapy for the treatment of follicular lymphoma.   1. Cancer-follicular lymphoma: Patient initial Truman Hayward presented to the emergency room in early October 2020 for a unprovoked PE.  He had chest pain revealing a massive pulmonary embolism with right heart strain.  He was started on heparin drip.  He was tachypneic and tachycardic.  He had a thrombectomy and thrombolysis.  Patient had selective catheter placement right upper lobe, middle lobe, lower lobe pulmonary arteries, left upper and lower pulmonary arteries.  Ultrasound venous duplex showed DVT of left lower extremity with nonocclusive thrombus.  CT also revealed incidental findings of peripancreatic retroperitoneal and small bowel mesentery omental mass adenopathy.  CT-guided omental biopsy was conducted during his hospital stay revealing follicular center cell for lymphoma.  At discharge he was started on Lovenox and Eliquis but is not taking his Eliquis.  PET scan on 02/11/2019 revealed Douville 5 disease, Douville 4 adenopathy in the pelvis and left axillary lymph node measuring 0.7 cm.  Mild splenomegaly.  2.  Chemo Care Clinic/High Risk for ER/Hospitalization during chemotherapy- We discussed the role of the chemo care clinic and identified patient specific risk factors. I discussed that patient was identified as high risk primarily based on stage of disease.   Patient is followed by PCP Dr. Emily Filbert.  He has health insurance through [employer] and [employer].  He currently works for time for a [employer].  He is active.  He is married and lives at home with his wife.  His past medical history is positive for: Past Medical History:  Diagnosis Date  . Diverticulitis   . Follicular lymphoma of intra-abdominal lymph nodes ([place]) 02/15/2019    His past surgical history is positive for: Past Surgical History:  Procedure Laterality Date  . CHOLECYSTECTOMY    . COLON RESECTION    . PULMONARY THROMBECTOMY N/A 01/29/2019   Procedure: PULMONARY THROMBECTOMY;  Surgeon: Algernon Huxley, MD;  Location: [facility];  Service: Cardiovascular;  Laterality: N/A;    3. Social Determinants of Health- we discussed that social determinants of health may have significant impacts on health and outcomes for cancer patients.  Today we discussed specific social determinants of performance status, alcohol use, depression, financial needs, food insecurity, housing, interpersonal violence, social connections, stress, tobacco use, and transportation.  After lengthy discussion the following were identified as areas of need: Patient denies any concerns at this time.  4.  Right shoulder pain: Started approximately 1 week ago.  Patient is wondering if he may have injured himself verse progressive disease.  We will get imaging of right shoulder to rule out injury.  4. Co-morbidities Complicating Care:  Past Medical History:  Diagnosis Date  . Diverticulitis   . Follicular lymphoma of intra-abdominal lymph nodes ([place]) 02/15/2019   We also discussed the role of the Symptom Management Clinic at [facility] for acute issues and methods of contacting clinic/provider. He denies needing specific assistance at this time and He will be followed by Janeann Merl , RN (Nurse Navigator).    Visit Diagnosis 1. Acute pain of right shoulder      Patient expressed  understanding and was in agreement with this plan. He also understands that He can call clinic at any time with any questions, concerns, or complaints.   A total of (25) minutes of face-to-face time was spent with this patient with greater than 50% of that time in counseling and care-coordination.  Rulon Abide, AGNP-C [facility] (work cell) [phone] (office)  CC: Dr. Tasia Catchings

## 2019-02-20 ENCOUNTER — Encounter: Payer: Self-pay | Admitting: Oncology

## 2019-02-20 ENCOUNTER — Other Ambulatory Visit: Payer: Self-pay | Admitting: Student

## 2019-02-21 ENCOUNTER — Other Ambulatory Visit: Payer: Self-pay

## 2019-02-21 ENCOUNTER — Ambulatory Visit
Admission: RE | Admit: 2019-02-21 | Discharge: 2019-02-21 | Disposition: A | Discharge status: Home / Self Care | Payer: BC Managed Care – PPO | Source: Ambulatory Visit | Attending: Oncology | Admitting: Oncology

## 2019-02-21 ENCOUNTER — Encounter: Payer: Self-pay | Admitting: Oncology

## 2019-02-21 DIAGNOSIS — C8293 Follicular lymphoma, unspecified, intra-abdominal lymph nodes: Secondary | ICD-10-CM

## 2019-02-21 DIAGNOSIS — C8593 Non-Hodgkin lymphoma, unspecified, intra-abdominal lymph nodes: Secondary | ICD-10-CM | POA: Diagnosis not present

## 2019-02-21 LAB — CBC
HCT: 43.6 % (ref 39.0–52.0)
Hemoglobin: 14.8 g/dL (ref 13.0–17.0)
MCH: 30 pg (ref 26.0–34.0)
MCHC: 33.9 g/dL (ref 30.0–36.0)
MCV: 88.4 fL (ref 80.0–100.0)
Platelets: 210 10*3/uL (ref 150–400)
RBC: 4.93 MIL/uL (ref 4.22–5.81)
RDW: 12.5 % (ref 11.5–15.5)
WBC: 5.2 10*3/uL (ref 4.0–10.5)
nRBC: 0 % (ref 0.0–0.2)

## 2019-02-21 LAB — PROTIME-INR
INR: 1 (ref 0.8–1.2)
Prothrombin Time: 13 seconds (ref 11.4–15.2)

## 2019-02-21 LAB — APTT: aPTT: 34 seconds (ref 24–36)

## 2019-02-21 LAB — GLUCOSE, CAPILLARY: Glucose-Capillary: 129 mg/dL — ABNORMAL HIGH (ref 70–99)

## 2019-02-21 MED ORDER — HEPARIN SOD (PORK) LOCK FLUSH 100 UNIT/ML IV SOLN
INTRAVENOUS | Status: AC
  Filled 2019-02-21: qty 5

## 2019-02-21 MED ORDER — MIDAZOLAM HCL 2 MG/2ML IJ SOLN
INTRAMUSCULAR | Status: AC | PRN
  Administered 2019-02-21: 2 mg via INTRAVENOUS
  Administered 2019-02-21: 1 mg via INTRAVENOUS

## 2019-02-21 MED ORDER — MIDAZOLAM HCL 5 MG/5ML IJ SOLN
INTRAMUSCULAR | Status: AC
  Filled 2019-02-21: qty 5

## 2019-02-21 MED ORDER — FENTANYL CITRATE (PF) 100 MCG/2ML IJ SOLN
INTRAMUSCULAR | Status: AC
  Filled 2019-02-21: qty 2

## 2019-02-21 MED ORDER — SODIUM CHLORIDE 0.9 % IV SOLN
INTRAVENOUS | Status: DC
  Administered 2019-02-21: 20 mL/h via INTRAVENOUS

## 2019-02-21 MED ORDER — HYDROCODONE-ACETAMINOPHEN 5-325 MG PO TABS: 1.0000 | ORAL_TABLET | ORAL | Status: DC | PRN

## 2019-02-21 MED ORDER — FENTANYL CITRATE (PF) 100 MCG/2ML IJ SOLN
INTRAMUSCULAR | Status: AC | PRN
  Administered 2019-02-21 (×2): 50 ug via INTRAVENOUS

## 2019-02-21 NOTE — Discharge Instructions (Signed)
Moderate Conscious Sedation, Adult, Care After °These instructions provide you with information about caring for yourself after your procedure. Your health care provider may also give you more specific instructions. Your treatment has been planned according to current medical practices, but problems sometimes occur. Call your health care provider if you have any problems or questions after your procedure. °What can I expect after the procedure? °After your procedure, it is common: °· To feel sleepy for several hours. °· To feel clumsy and have poor balance for several hours. °· To have poor judgment for several hours. °· To vomit if you eat too soon. °Follow these instructions at home: °For at least 24 hours after the procedure: ° °· Do not: °? Participate in activities where you could fall or become injured. °? Drive. °? Use heavy machinery. °? Drink alcohol. °? Take sleeping pills or medicines that cause drowsiness. °? Make important decisions or sign legal documents. °? Take care of children on your own. °· Rest. °Eating and drinking °· Follow the diet recommended by your health care provider. °· If you vomit: °? Drink water, juice, or soup when you can drink without vomiting. °? Make sure you have little or no nausea before eating solid foods. °General instructions °· Have a responsible adult stay with you until you are awake and alert. °· Take over-the-counter and prescription medicines only as told by your health care provider. °· If you smoke, do not smoke without supervision. °· Keep all follow-up visits as told by your health care provider. This is important. °Contact a health care provider if: °· You keep feeling nauseous or you keep vomiting. °· You feel light-headed. °· You develop a rash. °· You have a fever. °Get help right away if: °· You have trouble breathing. °This information is not intended to replace advice given to you by your health care provider. Make sure you discuss any questions you have  with your health care provider. °Document Released: 02/06/2013 Document Revised: 03/31/2017 Document Reviewed: 08/08/2015 °Elsevier Patient Education © 2020 [employer] °Needle Biopsy, Care After °These instructions tell you how to care for yourself after your procedure. Your doctor may also give you more specific instructions. Call your doctor if you have any problems or questions. °What can I expect after the procedure? °After the procedure, it is common to have: °· Soreness. °· Bruising. °· Mild pain. °Follow these instructions at home: ° °· Return to your normal activities as told by your doctor. Ask your doctor what activities are safe for you. °· Take over-the-counter and prescription medicines only as told by your doctor. °· Wash your hands with soap and water before you change your bandage (dressing). If you cannot use soap and water, use hand sanitizer. °· Follow instructions from your doctor about: °? How to take care of your puncture site. °? When and how to change your bandage. °? When to remove your bandage. °· Check your puncture site every day for signs of infection. Watch for: °? Redness, swelling, or pain. °? Fluid or blood.  °? Pus or a bad smell. °? Warmth. °· Do not take baths, swim, or use a hot tub until your doctor approves. Ask your doctor if you may take showers. You may only be allowed to take sponge baths. °· Keep all follow-up visits as told by your doctor. This is important. °Contact a doctor if you have: °· A fever. °· Redness, swelling, or pain at the puncture site, and it lasts longer than a few   days. °· Fluid, blood, or pus coming from the puncture site. °· Warmth coming from the puncture site. °Get help right away if: °· You have a lot of bleeding from the puncture site. °Summary °· After the procedure, it is common to have soreness, bruising, or mild pain at the puncture site. °· Check your puncture site every day for signs of infection, such as redness, swelling, or pain. °· Get  help right away if you have severe bleeding from your puncture site. °This information is not intended to replace advice given to you by your health care provider. Make sure you discuss any questions you have with your health care provider. °Document Released: 03/31/2008 Document Revised: 05/01/2017 Document Reviewed: 05/01/2017 °Elsevier Patient Education © 2020 Elsevier Inc. ° °

## 2019-02-21 NOTE — Procedures (Signed)
  Procedure: CT bone marrow biopsy L iliac EBL:   minimal Complications:  none immediate  See full dictation in [facility].  Dillard Cannon MD Main # [phone] Pager  [phone]

## 2019-02-21 NOTE — Progress Notes (Signed)
Patient clinically stable post BMB per Dr Vernard Gambles., tolerated well. Received Versed 3mg  IV along with Fentanyl 119mcg IV for procedure. Vitals stable throughout entire procedure. Awake, alert and oriented. Dressing/bandade dry and intact to posterior sacral area.

## 2019-02-22 NOTE — Telephone Encounter (Signed)
Dr. Tasia Catchings, Cam he resume his eliquis? Does he need to continue his lovelox for a couple days?  Faythe Casa, NP 02/22/2019 7:27 AM

## 2019-02-25 ENCOUNTER — Ambulatory Visit: Payer: BC Managed Care – PPO

## 2019-02-27 ENCOUNTER — Encounter: Payer: Self-pay | Admitting: Oncology

## 2019-02-27 ENCOUNTER — Other Ambulatory Visit: Payer: Self-pay

## 2019-02-27 DIAGNOSIS — I2609 Other pulmonary embolism with acute cor pulmonale: Secondary | ICD-10-CM | POA: Diagnosis not present

## 2019-02-27 DIAGNOSIS — E119 Type 2 diabetes mellitus without complications: Secondary | ICD-10-CM | POA: Diagnosis not present

## 2019-02-27 DIAGNOSIS — M778 Other enthesopathies, not elsewhere classified: Secondary | ICD-10-CM | POA: Diagnosis not present

## 2019-02-27 DIAGNOSIS — C8280 Other types of follicular lymphoma, unspecified site: Secondary | ICD-10-CM | POA: Diagnosis not present

## 2019-02-27 LAB — SURGICAL PATHOLOGY

## 2019-02-27 NOTE — Progress Notes (Signed)
Patient went to see his PCP and was told to stop taking his Glipizide and replace with another one. However, patient did not have it and will bring to clinic to put in his chart.

## 2019-02-28 ENCOUNTER — Inpatient Hospital Stay (HOSPITAL_BASED_OUTPATIENT_CLINIC_OR_DEPARTMENT_OTHER): Payer: BC Managed Care – PPO | Admitting: Oncology

## 2019-02-28 ENCOUNTER — Encounter (HOSPITAL_COMMUNITY): Payer: Self-pay | Admitting: Oncology

## 2019-02-28 ENCOUNTER — Inpatient Hospital Stay: Payer: BC Managed Care – PPO

## 2019-02-28 ENCOUNTER — Other Ambulatory Visit: Payer: Self-pay

## 2019-02-28 VITALS — BP 151/87 | HR 81 | Temp 98.8°F | Resp 16 | Wt 220.3 lb

## 2019-02-28 VITALS — BP 132/64 | HR 80 | Resp 18

## 2019-02-28 DIAGNOSIS — I2609 Other pulmonary embolism with acute cor pulmonale: Secondary | ICD-10-CM

## 2019-02-28 DIAGNOSIS — Z5111 Encounter for antineoplastic chemotherapy: Secondary | ICD-10-CM

## 2019-02-28 DIAGNOSIS — C8280 Other types of follicular lymphoma, unspecified site: Secondary | ICD-10-CM

## 2019-02-28 DIAGNOSIS — C8293 Follicular lymphoma, unspecified, intra-abdominal lymph nodes: Secondary | ICD-10-CM | POA: Diagnosis not present

## 2019-02-28 LAB — COMPREHENSIVE METABOLIC PANEL
ALT: 16 U/L (ref 0–44)
AST: 14 U/L — ABNORMAL LOW (ref 15–41)
Albumin: 4.4 g/dL (ref 3.5–5.0)
Alkaline Phosphatase: 47 U/L (ref 38–126)
Anion gap: 9 (ref 5–15)
BUN: 21 mg/dL — ABNORMAL HIGH (ref 6–20)
CO2: 24 mmol/L (ref 22–32)
Calcium: 9.6 mg/dL (ref 8.9–10.3)
Chloride: 105 mmol/L (ref 98–111)
Creatinine, Ser: 1.2 mg/dL (ref 0.61–1.24)
GFR calc Af Amer: >60 mL/min (ref >60)
GFR calc non Af Amer: >60 mL/min (ref >60)
Glucose, Bld: 104 mg/dL — ABNORMAL HIGH (ref 70–99)
Potassium: 4.7 mmol/L (ref 3.5–5.1)
Sodium: 138 mmol/L (ref 135–145)
Total Bilirubin: 0.4 mg/dL (ref 0.3–1.2)
Total Protein: 7.6 g/dL (ref 6.5–8.1)

## 2019-02-28 LAB — CBC WITH DIFFERENTIAL/PLATELET
Abs Immature Granulocytes: 0.02 10*3/uL (ref 0.00–0.07)
Basophils Absolute: 0.1 10*3/uL (ref 0.0–0.1)
Basophils Relative: 1 %
Eosinophils Absolute: 0.1 10*3/uL (ref 0.0–0.5)
Eosinophils Relative: 1 %
HCT: 43.2 % (ref 39.0–52.0)
Hemoglobin: 14.6 g/dL (ref 13.0–17.0)
Immature Granulocytes: 0 %
Lymphocytes Relative: 26 %
Lymphs Abs: 2 10*3/uL (ref 0.7–4.0)
MCH: 29.6 pg (ref 26.0–34.0)
MCHC: 33.8 g/dL (ref 30.0–36.0)
MCV: 87.6 fL (ref 80.0–100.0)
Monocytes Absolute: 0.6 10*3/uL (ref 0.1–1.0)
Monocytes Relative: 8 %
Neutro Abs: 4.9 10*3/uL (ref 1.7–7.7)
Neutrophils Relative %: 64 %
Platelets: 246 10*3/uL (ref 150–400)
RBC: 4.93 MIL/uL (ref 4.22–5.81)
RDW: 12.3 % (ref 11.5–15.5)
WBC: 7.7 10*3/uL (ref 4.0–10.5)
nRBC: 0 % (ref 0.0–0.2)

## 2019-02-28 MED ORDER — SODIUM CHLORIDE 0.9 % IV SOLN
Freq: Once | INTRAVENOUS | Status: AC
  Administered 2019-02-28: 10:00:00 via INTRAVENOUS
  Filled 2019-02-28: qty 250

## 2019-02-28 MED ORDER — SODIUM CHLORIDE 0.9 % IV SOLN
375.0000 mg/m2 | Freq: Once | INTRAVENOUS | Status: AC
  Administered 2019-02-28: 11:00:00 800 mg via INTRAVENOUS
  Filled 2019-02-28: qty 50

## 2019-02-28 MED ORDER — ACETAMINOPHEN 325 MG PO TABS
650.0000 mg | ORAL_TABLET | Freq: Once | ORAL | Status: AC
  Administered 2019-02-28: 10:00:00 650 mg via ORAL
  Filled 2019-02-28: qty 2

## 2019-02-28 MED ORDER — DIPHENHYDRAMINE HCL 25 MG PO CAPS
50.0000 mg | ORAL_CAPSULE | Freq: Once | ORAL | Status: AC
  Administered 2019-02-28: 10:00:00 50 mg via ORAL
  Filled 2019-02-28: qty 2

## 2019-02-28 MED ORDER — DEXAMETHASONE SODIUM PHOSPHATE 10 MG/ML IJ SOLN
10.0000 mg | Freq: Once | INTRAMUSCULAR | Status: AC
  Administered 2019-02-28: 10:00:00 10 mg via INTRAVENOUS
  Filled 2019-02-28: qty 1

## 2019-02-28 MED ORDER — PALONOSETRON HCL INJECTION 0.25 MG/5ML
0.2500 mg | Freq: Once | INTRAVENOUS | Status: AC
  Administered 2019-02-28: 0.25 mg via INTRAVENOUS
  Filled 2019-02-28: qty 5

## 2019-02-28 MED ORDER — SODIUM CHLORIDE 0.9 % IV SOLN
90.0000 mg/m2 | Freq: Once | INTRAVENOUS | Status: AC
  Administered 2019-02-28: 15:00:00 200 mg via INTRAVENOUS
  Filled 2019-02-28: qty 8

## 2019-02-28 MED ORDER — SODIUM CHLORIDE 0.9 % IV SOLN: 10.0000 mg | Freq: Once | INTRAVENOUS | Status: DC

## 2019-03-01 ENCOUNTER — Encounter: Payer: Self-pay | Admitting: *Deleted

## 2019-03-01 ENCOUNTER — Inpatient Hospital Stay: Payer: BC Managed Care – PPO

## 2019-03-01 ENCOUNTER — Other Ambulatory Visit: Payer: Self-pay

## 2019-03-01 ENCOUNTER — Encounter: Payer: BC Managed Care – PPO | Attending: Internal Medicine | Admitting: *Deleted

## 2019-03-01 VITALS — BP 120/70 | Ht 72.0 in | Wt 221.0 lb

## 2019-03-01 VITALS — BP 129/81 | HR 73 | Temp 97.5°F | Resp 18

## 2019-03-01 DIAGNOSIS — E119 Type 2 diabetes mellitus without complications: Secondary | ICD-10-CM

## 2019-03-01 DIAGNOSIS — C8293 Follicular lymphoma, unspecified, intra-abdominal lymph nodes: Secondary | ICD-10-CM

## 2019-03-01 DIAGNOSIS — Z6829 Body mass index (BMI) 29.0-29.9, adult: Secondary | ICD-10-CM | POA: Insufficient documentation

## 2019-03-01 DIAGNOSIS — E1165 Type 2 diabetes mellitus with hyperglycemia: Secondary | ICD-10-CM | POA: Insufficient documentation

## 2019-03-01 DIAGNOSIS — C8280 Other types of follicular lymphoma, unspecified site: Secondary | ICD-10-CM

## 2019-03-01 DIAGNOSIS — Z713 Dietary counseling and surveillance: Secondary | ICD-10-CM | POA: Insufficient documentation

## 2019-03-01 MED ORDER — SODIUM CHLORIDE 0.9 % IV SOLN: 10.0000 mg | Freq: Once | INTRAVENOUS | Status: DC

## 2019-03-01 MED ORDER — DEXAMETHASONE SODIUM PHOSPHATE 10 MG/ML IJ SOLN
10.0000 mg | Freq: Once | INTRAMUSCULAR | Status: AC
  Administered 2019-03-01: 10 mg via INTRAVENOUS
  Filled 2019-03-01: qty 1

## 2019-03-01 MED ORDER — SODIUM CHLORIDE 0.9 % IV SOLN
Freq: Once | INTRAVENOUS | Status: AC
  Administered 2019-03-01: 14:00:00 via INTRAVENOUS
  Filled 2019-03-01: qty 250

## 2019-03-01 MED ORDER — SODIUM CHLORIDE 0.9 % IV SOLN
90.0000 mg/m2 | Freq: Once | INTRAVENOUS | Status: AC
  Administered 2019-03-01: 200 mg via INTRAVENOUS
  Filled 2019-03-01: qty 8

## 2019-03-01 NOTE — Patient Instructions (Signed)
Check blood sugars 2 x day before breakfast and 2 hrs after supper every day Bring blood sugar records to the next class  Exercise: Continue walking for 20-30 minutes  7 days a week  Eat 3 meals day,  1-2  snacks a day Space meals 4-6 hours apart Don't skip meals   Make an eye doctor appointment  Return for Edward Atkinson on:

## 2019-03-01 NOTE — Progress Notes (Signed)
Diabetes Self-Management Education  Visit Type: First/Initial  Appt. Start Time: 0905 Appt. End Time: 1010  03/01/2019  Edward Atkinson, identified by name and date of birth, is a 58 y.o. male with a diagnosis of Diabetes: Type 2.   ASSESSMENT  Blood pressure 120/70, height 6' (1.829 m), weight 221 lb (100.2 kg). Body mass index is 29.97 kg/m.  Diabetes Self-Management Education - 03/01/19 1211      Visit Information   Visit Type  First/Initial      Initial Visit   Diabetes Type  Type 2    Are you currently following a meal plan?  Yes    What type of meal plan do you follow?  "low sugar, low carb"    Are you taking your medications as prescribed?  Yes    Date Diagnosed  1 month ago      Health Coping   How would you rate your overall health?  Good      Psychosocial Assessment   Patient Belief/Attitude about Diabetes  Motivated to manage diabetes    Self-care barriers  None    Self-management support  Doctor's office;Family    Patient Concerns  Nutrition/Meal planning;Glycemic Control;Monitoring;Weight Control;Medication;Healthy Lifestyle    Special Needs  None    Preferred Learning Style  Auditory;Visual    Learning Readiness  Change in progress    How often do you need to have someone help you when you read instructions, pamphlets, or other written materials from your doctor or pharmacy?  1 - Never    What is the last grade level you completed in school?  associates      Pre-Education Assessment   Patient understands the diabetes disease and treatment process.  Needs Instruction    Patient understands incorporating nutritional management into lifestyle.  Needs Instruction    Patient undertands incorporating physical activity into lifestyle.  Needs Review    Patient understands using medications safely.  Needs Instruction    Patient understands monitoring blood glucose, interpreting and using results  Needs Review    Patient understands prevention, detection, and  treatment of acute complications.  Needs Instruction    Patient understands prevention, detection, and treatment of chronic complications.  Needs Instruction    Patient understands how to develop strategies to address psychosocial issues.  Needs Instruction    Patient understands how to develop strategies to promote health/change behavior.  Needs Instruction      Complications   Last HgB A1C per patient/outside source  10.5 %   01/30/2019   How often do you check your blood sugar?  1-2 times/day    Fasting Blood glucose range (mg/dL)  70-129;180-200   Pt reports FBG's 110-120's mg/dL until he received chemo yesterday wtih steroids. FBG this am was 190 mg/dL.   Postprandial Blood glucose range (mg/dL)  70-129;>200   Pt reports post meals range form 110-120's mg/dL until he had chemo yesterday with steroids and pp last night was 300's mg/dL.   Have you had a dilated eye exam in the past 12 months?  No    Have you had a dental exam in the past 12 months?  No    Are you checking your feet?  Yes    How many days per week are you checking your feet?  7      Dietary Intake   Breakfast  Greek yogurt, glass of milk; eggs and toast    Snack (morning)  almonds    Lunch  skips or eats  tuna    Snack (afternoon)  almonds, peanut butter or cheese crackers    Dinner  chicken, pork, fish with potatoes, peas, beans, corn, green beans, broccoli, brussel sprouts, rice, pasta, salads, lettuce, onions, tomatoes,    Beverage(s)  water, black coffee, milk      Exercise   Exercise Type  Light (walking / raking leaves)    How many days per week to you exercise?  7    How many minutes per day do you exercise?  25    Total minutes per week of exercise  175      Patient Education   Previous Diabetes Education  No    Disease state   Definition of diabetes, type 1 and 2, and the diagnosis of diabetes;Factors that contribute to the development of diabetes    Nutrition management   Role of diet in the treatment of  diabetes and the relationship between the three main macronutrients and blood glucose level;Food label reading, portion sizes and measuring food.;Carbohydrate counting;Reviewed blood glucose goals for pre and post meals and how to evaluate the patients' food intake on their blood glucose level.    Physical activity and exercise   Role of exercise on diabetes management, blood pressure control and cardiac health.    Medications  Reviewed patients medication for diabetes, action, purpose, timing of dose and side effects.    Monitoring  Purpose and frequency of SMBG.;Taught/discussed recording of test results and interpretation of SMBG.;Identified appropriate SMBG and/or A1C goals.    Acute complications  Other (comment)   Hyperglycemia in relation to chemo and steroids which can increase blood sugars   Chronic complications  Relationship between chronic complications and blood glucose control    Psychosocial adjustment  Identified and addressed patients feelings and concerns about diabetes      Individualized Goals (developed by patient)   Reducing Risk  Improve blood sugars Decrease medications Prevent diabetes complications Lose weight Lead a healthier lifestyle Become more fit     Outcomes   Expected Outcomes  Demonstrated interest in learning. Expect positive outcomes    Future DMSE  2 wks       Individualized Plan for Diabetes Self-Management Training:   Learning Objective:  Patient will have a greater understanding of diabetes self-management. Patient education plan is to attend individual and/or group sessions per assessed needs and concerns.   Plan:   Patient Instructions  Check blood sugars 2 x day before breakfast and 2 hrs after supper every day Bring blood sugar records to the next class Exercise: Continue walking for 20-30 minutes  7 days a week Eat 3 meals day,  1-2  snacks a day Space meals 4-6 hours apart Don't skip meals  Make an eye doctor appointment  Expected  Outcomes:  Demonstrated interest in learning. Expect positive outcomes  Education material provided:  General Meal Planning Guidelines Simple Meal Plan  If problems or questions, patient to contact team via:  Johny Drilling, RN, [place], CDE [phone]  Future DSME appointment: 2 wks  March 11, 2019 for Diabetes Class 1

## 2019-03-02 NOTE — Progress Notes (Signed)
Hematology/Oncology Follow Up Note [facility]  [phone] [phone]  Patient Care Team: Rusty Aus, MD as PCP - General (Internal Medicine)   Name of the patient: Edward Atkinson  [id number]  08/04/1960   REASON FOR VISIT  follow-up for management of pulmonary embolism, follicular cell lymphoma.  PERTINENT ONCOLOGY HISTORY # 01/31/2019 Unprovoked PE,  CT chest PE protocol reviewed bilateral massive pulmonary embolism with right heart strain and patient was started on heparin drip.  Patient was tachypneic and tachycardia.  Vascular surgeon was consulted and the patient was taken to the OR for thrombectomy and thrombolysis.  Patient also had selective catheter placement right upper lobe, middle lobe, lower lobe pulmonary arteries, left upper and lower pulmonary arteries. Ultrasound venous duplex bilateral showed DVT of the left lower extremity with nonocclusive thrombus in the left common femoral vein.  Some of the common femoral venous thrombus does extend just into the GSV across SF J.  No evidence of right lower extremity DVT. Patient was started on heparin drip for anticoagulation. Patient is status post embolectomy by vascular surgery. #Additional CT pancreas was done during that admission which showed peripancreatic/retroperitoneal and contiguous small bowel mesentery nodal mass, retroperitoneal adenopathy, gastrohepatic ligament lymphadenopathy, left abdominal omental nodule CT-guided omentum biopsy was done during hospital.  Patient was switched to Lovenox at discharge.  #  PET scan done on 02/11/2019 which showed large conglomerate retroperitoneal and mesenteric mass with maximal SUV 10.6 compatible with Deauville 5 disease.  Deauville 4 adenopathy in the pelvis. A left axillary lymph node measuring 0.7 cm in short axis has a maximum SUV of 3.3. Mild splenomegaly without focal splenic lesion identified. #02/21/2019, bone marrow biopsy  showed no lymphoma involvement.  There might be lymphocyte aggregates that were being cut away on the deeper levels.  Additional IHC did not show residual echograms.  INTERVAL HISTORY 58 y.o. male with newly diagnosed pulmonary embolism and follicular lymphoma present for follow-up. Today she reports feeling well. No constitutional symptoms. During the interval which he had bone marrow biopsy done.  Tolerated well. He has been switched to Eliquis 5 mg twice daily for pulmonary embolism.  Reports that breathing is good.  No shortness of breath or chest pain. No new complaints today.  Review of Systems  Constitutional: Negative for appetite change, chills, fatigue, fever and unexpected weight change.  HENT:   Negative for hearing loss and voice change.   Eyes: Negative for eye problems and icterus.  Respiratory: Negative for chest tightness, cough and shortness of breath.   Cardiovascular: Negative for chest pain and leg swelling.  Gastrointestinal: Negative for abdominal distention and abdominal pain.  Endocrine: Negative for hot flashes.  Genitourinary: Negative for difficulty urinating, dysuria and frequency.   Musculoskeletal: Negative for arthralgias.  Skin: Negative for itching and rash.  Neurological: Negative for light-headedness and numbness.  Hematological: Negative for adenopathy. Does not bruise/bleed easily.  Psychiatric/Behavioral: Negative for confusion.      Allergies  Allergen Reactions  . Penicillins Rash     Past Medical History:  Diagnosis Date  . Diabetes mellitus without complication ([place])   . Diverticulitis   . Follicular lymphoma of intra-abdominal lymph nodes ([place]) 02/15/2019     Past Surgical History:  Procedure Laterality Date  . CHOLECYSTECTOMY    . COLON RESECTION    . PULMONARY THROMBECTOMY N/A 01/29/2019   Procedure: PULMONARY THROMBECTOMY;  Surgeon: Algernon Huxley, MD;  Location: [facility];  Service: Cardiovascular;  Laterality: N/A;     Social History   Socioeconomic History  . Marital status: Married    Spouse name: Not on file  . Number of children: Not on file  . Years of education: Not on file  . Highest education level: Not on file  Occupational History  . Not on file  Social Needs  . Financial resource strain: Not on file  . Food insecurity    Worry: Not on file    Inability: Not on file  . Transportation needs    Medical: Not on file    Non-medical: Not on file  Tobacco Use  . Smoking status: Never Smoker  . Smokeless tobacco: Never Used  Substance and Sexual Activity  . Alcohol use: Yes    Comment: occasional  . Drug use: Never  . Sexual activity: Yes  Lifestyle  . Physical activity    Days per week: Not on file    Minutes per session: Not on file  . Stress: Not on file  Relationships  . Social Herbalist on phone: Not on file    Gets together: Not on file    Attends religious service: Not on file    Active member of club or organization: Not on file    Attends meetings of clubs or organizations: Not on file    Relationship status: Not on file  . Intimate partner violence    Fear of current or ex partner: Not on file    Emotionally abused: Not on file    Physically abused: Not on file    Forced sexual activity: Not on file  Other Topics Concern  . Not on file  Social History Narrative  . Not on file    Family History  Problem Relation Age of Onset  . Diabetes Brother      Current Outpatient Medications:  .  allopurinol (ZYLOPRIM) 300 MG tablet, Take 1 tablet (300 mg total) by mouth daily., Disp: 30 tablet, Rfl: 3 .  apixaban (ELIQUIS) 5 MG TABS tablet, Take 1 tablet (5 mg total) by mouth 2 (two) times daily., Disp: 60 tablet, Rfl: 11 .  glimepiride (AMARYL) 4 MG tablet, Take 4 mg by mouth daily with breakfast., Disp: , Rfl:  .  prochlorperazine (COMPAZINE) 10 MG tablet, Take 1 tablet (10 mg total) by mouth every 6 (six) hours as needed (Nausea or vomiting). (Patient  not taking: Reported on 02/27/2019), Disp: 30 tablet, Rfl: 1  Physical exam:  Vitals:   02/28/19 0837  BP: (!) 151/87  Pulse: 81  Resp: 16  Temp: 98.8 F (37.1 C)  TempSrc: Temporal  SpO2: 97%  Weight: 220 lb 4.8 oz (99.9 kg)   Physical Exam Constitutional:      General: He is not in acute distress. HENT:     Head: Normocephalic and atraumatic.  Eyes:     General: No scleral icterus.    Pupils: Pupils are equal, round, and reactive to light.  Neck:     Musculoskeletal: Normal range of motion and neck supple.  Cardiovascular:     Rate and Rhythm: Normal rate and regular rhythm.     Heart sounds: Normal heart sounds.  Pulmonary:     Effort: Pulmonary effort is normal. No respiratory distress.     Breath sounds: No wheezing.  Abdominal:     General: Bowel sounds are normal. There is no distension.     Palpations: Abdomen is soft. There is no mass.  Tenderness: There is no abdominal tenderness.  Musculoskeletal: Normal range of motion.        General: No deformity.  Skin:    General: Skin is warm and dry.     Findings: No erythema or rash.  Neurological:     Mental Status: He is alert and oriented to person, place, and time.     Cranial Nerves: No cranial nerve deficit.     Coordination: Coordination normal.  Psychiatric:        Behavior: Behavior normal.        Thought Content: Thought content normal.     CMP Latest Ref Rng & Units 02/28/2019  Glucose 70 - 99 mg/dL 104(H)  BUN 6 - 20 mg/dL 21(H)  Creatinine 0.61 - 1.24 mg/dL 1.20  Sodium 135 - 145 mmol/L 138  Potassium 3.5 - 5.1 mmol/L 4.7  Chloride 98 - 111 mmol/L 105  CO2 22 - 32 mmol/L 24  Calcium 8.9 - 10.3 mg/dL 9.6  Total Protein 6.5 - 8.1 g/dL 7.6  Total Bilirubin 0.3 - 1.2 mg/dL 0.4  Alkaline Phos 38 - 126 U/L 47  AST 15 - 41 U/L 14(L)  ALT 0 - 44 U/L 16   CBC Latest Ref Rng & Units 02/28/2019  WBC 4.0 - 10.5 K/uL 7.7  Hemoglobin 13.0 - 17.0 g/dL 14.6  Hematocrit 39.0 - 52.0 % 43.2   Platelets 150 - 400 K/uL 246    Dg Shoulder Right  Result Date: 02/19/2019 CLINICAL DATA:  Right arm and shoulder pain.  No known injury. EXAM: RIGHT SHOULDER - 2+ VIEW COMPARISON:  None FINDINGS: Degenerative changes in the [facility] joint with joint space narrowing and spurring. Glenohumeral joint is maintained. No acute bony abnormality. Specifically, no fracture, subluxation, or dislocation. Soft tissues are intact. IMPRESSION: Degenerative changes in the right AC joint. No acute bony abnormality. Electronically Signed   By: Rolm Baptise M.D.   On: 02/19/2019 12:07   Nm Pet Image Initial (pi) Skull Base To Thigh  Result Date: 02/11/2019 CLINICAL DATA:  Initial treatment strategy for follicular lymphoma. EXAM: NUCLEAR MEDICINE PET SKULL BASE TO THIGH TECHNIQUE: 12.2 mCi F-18 FDG was injected intravenously. Full-ring PET imaging was performed from the skull base to thigh after the radiotracer. CT data was obtained and used for attenuation correction and anatomic localization. Fasting blood glucose: 134 mg/dl COMPARISON:  Multiple exams, including CT abdomen 01/30/2019 FINDINGS: Mediastinal blood pool activity: SUV max 2.1 Liver activity: SUV max 3.9 NECK: Minimal accentuated activity in the vicinity of the left medial pterygoid plate adjacent to a supernumerary tooth along the posterior maxilla. No corresponding CT abnormality, maximum SUV 5.5. This is likely incidental. Incidental CT findings: none CHEST: Left axillary node measuring 0.7 cm in short axis on image 84/3 has a maximum SUV of 3.3, Deauville 3. Incidental CT findings: Mild atelectasis along the right hemidiaphragm. ABDOMEN/PELVIS: Large conglomerate retroperitoneal and mesenteric mass, measuring about 18.3 by 8.7 cm, maximum SUV 10.6, compatible with Deauville 5 disease. This partially surrounds the pancreas, extends along the splenic hilum, and extends into the left perirenal space but in the left kidney. There are individually enlarged and  hypermetabolic pelvic lymph nodes especially on the right, including a right common iliac node measuring 1.4 cm in short axis on image 216/3 with maximum SUV 7.8 (Deauville 4), and a right external iliac node measuring 2.0 cm in short axis on image 243/3 with maximum SUV 5.4, Deauville 4. There is a small right inguinal focus of accentuated metabolic  activity and stranding in the subcutaneous tissues, maximum SUV 4.1, without a well-defined lymph node. Incidental CT findings: Cholecystectomy. No hydronephrosis. The spleen measures 12.6 by 5.7 by 13.0 cm (volume = 490 cm^3), without focal hypermetabolic activity. Postoperative findings in the rectum. Punctate calcifications along small scrotal hydroceles bilaterally. SKELETON: No significant abnormal hypermetabolic activity in this region. Incidental CT findings: Chronic bilateral pars defects at L5 with grade 2 anterolisthesis. Mesoacromial os acromiale on the left. IMPRESSION: 1. The large conglomerate retroperitoneal and mesenteric mass of the maximum SUV of 10.6 compatible with Deauville 5 disease. There is Deauville 4 adenopathy in the pelvis. 2. A left axillary lymph node measuring 0.7 cm in short axis has a maximum SUV of 3.3, compatible with Deauville 3 disease. 3. Minimally accentuated activity in the vicinity of the left medial pterygoid plate, without corresponding CT abnormality, probably incidental. 4. Mild splenomegaly, without focal splenic lesion identified. 5. Chronic pars defects at L5 with grade 2 anterolisthesis. 6. Mild atelectasis along the right hemidiaphragm. Electronically Signed   By: Van Clines M.D.   On: 02/11/2019 12:41   Ct Bone Marrow Biopsy & Aspiration  Result Date: 02/21/2019 CLINICAL DATA:  Abdominal lymphoma EXAM: CT GUIDED DEEP ILIAC BONE ASPIRATION AND CORE BIOPSY TECHNIQUE: Patient was placed prone on the CT gantry and limited axial scans through the pelvis were obtained. Appropriate skin entry site was identified.  Skin site was marked, prepped with chlorhexidine, draped in usual sterile fashion, and infiltrated locally with 1% lidocaine. Intravenous Fentanyl 170mg and Versed 353mwere administered as conscious sedation during continuous monitoring of the patient's level of consciousness and physiological / cardiorespiratory status by the radiology RN, with a total moderate sedation time of 8 minutes. Under CT fluoroscopic guidance an 11-gauge Cook trocar bone needle was advanced into the left iliac bone just lateral to the sacroiliac joint. Once needle tip position was confirmed, core and aspiration samples were obtained, submitted to pathology for approval. Post procedure scans show no hematoma or fracture. Patient tolerated procedure well. COMPLICATIONS: COMPLICATIONS none IMPRESSION: 1. Technically successful CT guided left iliac bone core and aspiration biopsy. Electronically Signed   By: D Lucrezia Europe.D.   On: 02/21/2019 10:34   Vas [place] Extremity Venous (dvt)  Result Date: 02/07/2019  Lower Venous Study Other Indications: Follow-up DVT. Risk Factors: DVT 01/30/2019:Lt LE DVT. Comparison Study: 01/30/2019 Performing Technologist: SoAlmira CoasterVS  Examination Guidelines: A complete evaluation includes B-mode imaging, spectral Doppler, color Doppler, and power Doppler as needed of all accessible portions of each vessel. Bilateral testing is considered an integral part of a complete examination. Limited examinations for reoccurring indications may be performed as noted.  +-----+---------------+---------+-----------+----------+--------------+ RIGHTCompressibilityPhasicitySpontaneityPropertiesThrombus Aging +-----+---------------+---------+-----------+----------+--------------+ CFV  Full           Yes      Yes                                 +-----+---------------+---------+-----------+----------+--------------+   +---------+---------------+---------+-----------+----------+--------------+ LEFT      CompressibilityPhasicitySpontaneityPropertiesThrombus Aging +---------+---------------+---------+-----------+----------+--------------+ CFV      Partial        Yes      Yes                                 +---------+---------------+---------+-----------+----------+--------------+ SFJ      Full           Yes  Yes                                 +---------+---------------+---------+-----------+----------+--------------+ FV Prox  Full           Yes      Yes                                 +---------+---------------+---------+-----------+----------+--------------+ FV Mid   Partial        Yes      Yes                                 +---------+---------------+---------+-----------+----------+--------------+ FV DistalFull           Yes      Yes                                 +---------+---------------+---------+-----------+----------+--------------+ PFV      Full           Yes      Yes                                 +---------+---------------+---------+-----------+----------+--------------+ POP      Full           Yes      Yes                                 +---------+---------------+---------+-----------+----------+--------------+ PTV      Full           Yes      Yes                                 +---------+---------------+---------+-----------+----------+--------------+ PERO     Full           Yes      Yes                                 +---------+---------------+---------+-----------+----------+--------------+ GSV      Full           Yes      Yes                                 +---------+---------------+---------+-----------+----------+--------------+ Paired Veins seen in the SFV Prox/Mid through Mid segment; One(1) of them displaying signs of An Acute Process of DVT.    Summary: Left: Findings consistent with acute deep vein thrombosis involving the left femoral vein. Findings consistent with chronic deep vein thrombosis involving  the left common femoral vein. There is no evidence of superficial venous thrombosis.There is no evidence of chronic venous insufficiency.  *See table(s) above for measurements and observations. Electronically signed by Hortencia Pilar MD on 02/07/2019 at 4:42:55 PM.    Final      Assessment and plan 1. Follicular lymphoma of intra-abdominal lymph nodes, unspecified follicular lymphoma type ([place])   2. Other acute pulmonary embolism with acute cor pulmonale ([place])  3. Encounter for antineoplastic chemotherapy    Stage III follicular lymphoma Bone marrow results were discussed with patient.  No definitive evidence of bone marrow involvement. Labs are reviewed and discussed with patient. Counts acceptable to proceed with cycle 1 BR.  Advised patient to take allopurinol for prophylaxis of tumor lysis given the bulky size of lymphoma. Encourage oral hydration.  Acute pulmonary embolism, patient is now on Eliquis 5 mg twice daily.  Clinically doing well. Continue.  No bleeding events.  Follow up in 1 week for assessment of tolerability.   Earlie Server, MD, PhD Hematology Oncology [facility] Pager- [phone] 03/02/2019

## 2019-03-04 ENCOUNTER — Telehealth (INDEPENDENT_AMBULATORY_CARE_PROVIDER_SITE_OTHER): Payer: Self-pay

## 2019-03-04 ENCOUNTER — Other Ambulatory Visit: Payer: Self-pay

## 2019-03-04 ENCOUNTER — Telehealth: Payer: Self-pay

## 2019-03-04 DIAGNOSIS — Z95828 Presence of other vascular implants and grafts: Secondary | ICD-10-CM

## 2019-03-04 MED ORDER — LIDOCAINE-PRILOCAINE 2.5-2.5 % EX CREA: 1.0000 "application " | TOPICAL_CREAM | CUTANEOUS | 0 refills | Status: DC | PRN

## 2019-03-04 NOTE — Telephone Encounter (Signed)
Patient returned my call and is now scheduled with Dr. Lucky Cowboy for a port placement on 03/11/2019 with a 11:15 am arrival time to the [facility]. Patient will do his Covid testing on 03/08/2019 between 12:30-2:30 pm at the [facility]. Pre-procedure instructions were discussed and will be mailed to the patient.

## 2019-03-04 NOTE — Telephone Encounter (Signed)
I attempted to contact the patient, a message was left on his mobile phone for a return call.

## 2019-03-04 NOTE — Telephone Encounter (Signed)
Telephone call to patient for follow up after receiving first infusion last Thursday and Friday.   States feeling great.  Denies any nausea, pain or any other complaints.   States will be getting a port next Monday.   Encouraged patient to call for any questions or concerns.  Patient thanked me for calling to check on him.

## 2019-03-06 ENCOUNTER — Encounter: Payer: Self-pay | Admitting: Oncology

## 2019-03-06 ENCOUNTER — Other Ambulatory Visit: Payer: Self-pay

## 2019-03-06 NOTE — Progress Notes (Signed)
Patient prescreened for follow up. Pt scheduled to have port placement on 11/9.

## 2019-03-07 ENCOUNTER — Inpatient Hospital Stay: Payer: BC Managed Care – PPO | Attending: Oncology

## 2019-03-07 ENCOUNTER — Other Ambulatory Visit: Payer: Self-pay

## 2019-03-07 ENCOUNTER — Inpatient Hospital Stay (HOSPITAL_BASED_OUTPATIENT_CLINIC_OR_DEPARTMENT_OTHER): Payer: BC Managed Care – PPO | Admitting: Oncology

## 2019-03-07 ENCOUNTER — Encounter: Payer: Self-pay | Admitting: Oncology

## 2019-03-07 ENCOUNTER — Other Ambulatory Visit
Admission: RE | Admit: 2019-03-07 | Discharge: 2019-03-07 | Disposition: A | Discharge status: Home / Self Care | Payer: BC Managed Care – PPO | Source: Ambulatory Visit | Attending: Vascular Surgery | Admitting: Vascular Surgery

## 2019-03-07 ENCOUNTER — Inpatient Hospital Stay: Payer: BC Managed Care – PPO

## 2019-03-07 VITALS — BP 146/89 | HR 83 | Temp 99.1°F | Resp 18 | Wt 212.1 lb

## 2019-03-07 DIAGNOSIS — Z79899 Other long term (current) drug therapy: Secondary | ICD-10-CM | POA: Diagnosis not present

## 2019-03-07 DIAGNOSIS — Z20828 Contact with and (suspected) exposure to other viral communicable diseases: Secondary | ICD-10-CM | POA: Diagnosis not present

## 2019-03-07 DIAGNOSIS — N433 Hydrocele, unspecified: Secondary | ICD-10-CM | POA: Insufficient documentation

## 2019-03-07 DIAGNOSIS — C8280 Other types of follicular lymphoma, unspecified site: Secondary | ICD-10-CM

## 2019-03-07 DIAGNOSIS — C8293 Follicular lymphoma, unspecified, intra-abdominal lymph nodes: Secondary | ICD-10-CM | POA: Diagnosis not present

## 2019-03-07 DIAGNOSIS — I2609 Other pulmonary embolism with acute cor pulmonale: Secondary | ICD-10-CM | POA: Diagnosis not present

## 2019-03-07 DIAGNOSIS — Z01812 Encounter for preprocedural laboratory examination: Secondary | ICD-10-CM | POA: Diagnosis not present

## 2019-03-07 DIAGNOSIS — E119 Type 2 diabetes mellitus without complications: Secondary | ICD-10-CM | POA: Diagnosis not present

## 2019-03-07 DIAGNOSIS — R161 Splenomegaly, not elsewhere classified: Secondary | ICD-10-CM | POA: Diagnosis not present

## 2019-03-07 DIAGNOSIS — R799 Abnormal finding of blood chemistry, unspecified: Secondary | ICD-10-CM

## 2019-03-07 DIAGNOSIS — Z7984 Long term (current) use of oral hypoglycemic drugs: Secondary | ICD-10-CM | POA: Diagnosis not present

## 2019-03-07 DIAGNOSIS — Z86718 Personal history of other venous thrombosis and embolism: Secondary | ICD-10-CM | POA: Insufficient documentation

## 2019-03-07 DIAGNOSIS — Z5111 Encounter for antineoplastic chemotherapy: Secondary | ICD-10-CM

## 2019-03-07 DIAGNOSIS — R944 Abnormal results of kidney function studies: Secondary | ICD-10-CM | POA: Diagnosis not present

## 2019-03-07 DIAGNOSIS — R000 Tachycardia, unspecified: Secondary | ICD-10-CM | POA: Diagnosis not present

## 2019-03-07 DIAGNOSIS — Z7901 Long term (current) use of anticoagulants: Secondary | ICD-10-CM | POA: Diagnosis not present

## 2019-03-07 LAB — CBC WITH DIFFERENTIAL/PLATELET
Abs Immature Granulocytes: 0.09 10*3/uL — ABNORMAL HIGH (ref 0.00–0.07)
Basophils Absolute: 0 10*3/uL (ref 0.0–0.1)
Basophils Relative: 0 %
Eosinophils Absolute: 0.1 10*3/uL (ref 0.0–0.5)
Eosinophils Relative: 1 %
HCT: 46.8 % (ref 39.0–52.0)
Hemoglobin: 16.1 g/dL (ref 13.0–17.0)
Immature Granulocytes: 1 %
Lymphocytes Relative: 4 %
Lymphs Abs: 0.4 10*3/uL — ABNORMAL LOW (ref 0.7–4.0)
MCH: 29.9 pg (ref 26.0–34.0)
MCHC: 34.4 g/dL (ref 30.0–36.0)
MCV: 87 fL (ref 80.0–100.0)
Monocytes Absolute: 1.1 10*3/uL — ABNORMAL HIGH (ref 0.1–1.0)
Monocytes Relative: 11 %
Neutro Abs: 8 10*3/uL — ABNORMAL HIGH (ref 1.7–7.7)
Neutrophils Relative %: 83 %
Platelets: 294 10*3/uL (ref 150–400)
RBC: 5.38 MIL/uL (ref 4.22–5.81)
RDW: 12.4 % (ref 11.5–15.5)
WBC: 9.7 10*3/uL (ref 4.0–10.5)
nRBC: 0 % (ref 0.0–0.2)

## 2019-03-07 LAB — COMPREHENSIVE METABOLIC PANEL
ALT: 25 U/L (ref 0–44)
AST: 14 U/L — ABNORMAL LOW (ref 15–41)
Albumin: 4.6 g/dL (ref 3.5–5.0)
Alkaline Phosphatase: 51 U/L (ref 38–126)
Anion gap: 9 (ref 5–15)
BUN: 28 mg/dL — ABNORMAL HIGH (ref 6–20)
CO2: 26 mmol/L (ref 22–32)
Calcium: 9.7 mg/dL (ref 8.9–10.3)
Chloride: 100 mmol/L (ref 98–111)
Creatinine, Ser: 1.22 mg/dL (ref 0.61–1.24)
GFR calc Af Amer: >60 mL/min (ref >60)
GFR calc non Af Amer: >60 mL/min (ref >60)
Glucose, Bld: 112 mg/dL — ABNORMAL HIGH (ref 70–99)
Potassium: 4.5 mmol/L (ref 3.5–5.1)
Sodium: 135 mmol/L (ref 135–145)
Total Bilirubin: 0.6 mg/dL (ref 0.3–1.2)
Total Protein: 7.7 g/dL (ref 6.5–8.1)

## 2019-03-07 LAB — URIC ACID: Uric Acid, Serum: 4.7 mg/dL (ref 3.7–8.6)

## 2019-03-07 LAB — SARS CORONAVIRUS 2 (TAT 6-24 HRS): SARS Coronavirus 2: NEGATIVE

## 2019-03-07 LAB — SURGICAL PATHOLOGY

## 2019-03-07 MED ORDER — SODIUM CHLORIDE 0.9 % IV SOLN
Freq: Once | INTRAVENOUS | Status: AC
  Administered 2019-03-07: 11:00:00 via INTRAVENOUS
  Filled 2019-03-07: qty 250

## 2019-03-07 NOTE — Progress Notes (Signed)
Hematology/Oncology Follow Up Note [facility]  [phone] [phone]  Patient Care Team: Rusty Aus, MD as PCP - General (Internal Medicine)   Name of the patient: Edward Atkinson  [id number]  [birth year]   REASON FOR VISIT  follow-up for management of pulmonary embolism, follicular cell lymphoma.  PERTINENT ONCOLOGY HISTORY # 01/31/2019 Unprovoked PE,  CT chest PE protocol reviewed bilateral massive pulmonary embolism with right heart strain and patient was started on heparin drip.  Patient was tachypneic and tachycardia.  Vascular surgeon was consulted and the patient was taken to the OR for thrombectomy and thrombolysis.  Patient also had selective catheter placement right upper lobe, middle lobe, lower lobe pulmonary arteries, left upper and lower pulmonary arteries. Ultrasound venous duplex bilateral showed DVT of the left lower extremity with nonocclusive thrombus in the left common femoral vein.  Some of the common femoral venous thrombus does extend just into the GSV across SF J.  No evidence of right lower extremity DVT. Patient was started on heparin drip for anticoagulation. Patient is status post embolectomy by vascular surgery. #Additional CT pancreas was done during that admission which showed peripancreatic/retroperitoneal and contiguous small bowel mesentery nodal mass, retroperitoneal adenopathy, gastrohepatic ligament lymphadenopathy, left abdominal omental nodule CT-guided omentum biopsy was done during hospital.  Patient was switched to Lovenox at discharge.  #  PET scan done on 02/11/2019 which showed large conglomerate retroperitoneal and mesenteric mass with maximal SUV 10.6 compatible with Deauville 5 disease.  Deauville 4 adenopathy in the pelvis. A left axillary lymph node measuring 0.7 cm in short axis has a maximum SUV of 3.3. Mild splenomegaly without focal splenic lesion identified. #02/21/2019, bone marrow biopsy  showed no lymphoma involvement.  There might be lymphocyte aggregates that were being cut away on the deeper levels.  Additional IHC did not show residual echograms.  INTERVAL HISTORY 58 y.o. male with newly diagnosed pulmonary embolism and follicular lymphoma present for follow-up. Today she reports feeling well. No constitutional symptoms. During the interval which he had bone marrow biopsy done.  Tolerated well. He has been switched to Eliquis 5 mg twice daily for pulmonary embolism.  Reports that breathing is good.  No shortness of breath or chest pain. No new complaints today.  Review of Systems  Constitutional: Negative for appetite change, chills, fatigue, fever and unexpected weight change.  HENT:   Negative for hearing loss and voice change.   Eyes: Negative for eye problems and icterus.  Respiratory: Negative for chest tightness, cough and shortness of breath.   Cardiovascular: Negative for chest pain and leg swelling.  Gastrointestinal: Negative for abdominal distention and abdominal pain.  Endocrine: Negative for hot flashes.  Genitourinary: Negative for difficulty urinating, dysuria and frequency.   Musculoskeletal: Negative for arthralgias.  Skin: Negative for itching and rash.  Neurological: Negative for light-headedness and numbness.  Hematological: Negative for adenopathy. Does not bruise/bleed easily.  Psychiatric/Behavioral: Negative for confusion.      Allergies  Allergen Reactions  . Penicillins Rash     Past Medical History:  Diagnosis Date  . Diabetes mellitus without complication ([place])   . Diverticulitis   . Follicular lymphoma of intra-abdominal lymph nodes ([place]) 02/15/2019     Past Surgical History:  Procedure Laterality Date  . CHOLECYSTECTOMY    . COLON RESECTION    . PULMONARY THROMBECTOMY N/A 01/29/2019   Procedure: PULMONARY THROMBECTOMY;  Surgeon: Algernon Huxley, MD;  Location: [facility];  Service: Cardiovascular;  Laterality: N/A;     Social History   Socioeconomic History  . Marital status: Married    Spouse name: Not on file  . Number of children: Not on file  . Years of education: Not on file  . Highest education level: Not on file  Occupational History  . Not on file  Social Needs  . Financial resource strain: Not on file  . Food insecurity    Worry: Not on file    Inability: Not on file  . Transportation needs    Medical: Not on file    Non-medical: Not on file  Tobacco Use  . Smoking status: Never Smoker  . Smokeless tobacco: Never Used  Substance and Sexual Activity  . Alcohol use: Yes    Comment: occasional  . Drug use: Never  . Sexual activity: Yes  Lifestyle  . Physical activity    Days per week: Not on file    Minutes per session: Not on file  . Stress: Not on file  Relationships  . Social Herbalist on phone: Not on file    Gets together: Not on file    Attends religious service: Not on file    Active member of club or organization: Not on file    Attends meetings of clubs or organizations: Not on file    Relationship status: Not on file  . Intimate partner violence    Fear of current or ex partner: Not on file    Emotionally abused: Not on file    Physically abused: Not on file    Forced sexual activity: Not on file  Other Topics Concern  . Not on file  Social History Narrative  . Not on file    Family History  Problem Relation Age of Onset  . Diabetes Brother      Current Outpatient Medications:  .  allopurinol (ZYLOPRIM) 300 MG tablet, Take 1 tablet (300 mg total) by mouth daily., Disp: 30 tablet, Rfl: 3 .  apixaban (ELIQUIS) 5 MG TABS tablet, Take 1 tablet (5 mg total) by mouth 2 (two) times daily., Disp: 60 tablet, Rfl: 11 .  glimepiride (AMARYL) 4 MG tablet, Take 4 mg by mouth daily with breakfast., Disp: , Rfl:  .  prochlorperazine (COMPAZINE) 10 MG tablet, Take 1 tablet (10 mg total) by mouth every 6 (six) hours as needed (Nausea or vomiting)., Disp:  30 tablet, Rfl: 1 .  lidocaine-prilocaine (EMLA) cream, Apply 1 application topically as needed. (Patient not taking: Reported on 03/06/2019), Disp: 30 g, Rfl: 0  Physical exam:  Vitals:   03/07/19 1017  BP: (!) 146/89  Pulse: 83  Resp: 18  Temp: 99.1 F (37.3 C)  Weight: 212 lb 1.6 oz (96.2 kg)   Physical Exam Constitutional:      General: He is not in acute distress. HENT:     Head: Normocephalic and atraumatic.  Eyes:     General: No scleral icterus.    Pupils: Pupils are equal, round, and reactive to light.  Neck:     Musculoskeletal: Normal range of motion and neck supple.  Cardiovascular:     Rate and Rhythm: Normal rate and regular rhythm.     Heart sounds: Normal heart sounds.  Pulmonary:     Effort: Pulmonary effort is normal. No respiratory distress.     Breath sounds: No wheezing.  Abdominal:     General: Bowel sounds are normal. There is no distension.     Palpations: Abdomen  is soft. There is no mass.     Tenderness: There is no abdominal tenderness.  Musculoskeletal: Normal range of motion.        General: No deformity.  Skin:    General: Skin is warm and dry.     Findings: No erythema or rash.  Neurological:     Mental Status: He is alert and oriented to person, place, and time.     Cranial Nerves: No cranial nerve deficit.     Coordination: Coordination normal.  Psychiatric:        Behavior: Behavior normal.        Thought Content: Thought content normal.     CMP Latest Ref Rng & Units 03/07/2019  Glucose 70 - 99 mg/dL 112(H)  BUN 6 - 20 mg/dL 28(H)  Creatinine 0.61 - 1.24 mg/dL 1.22  Sodium 135 - 145 mmol/L 135  Potassium 3.5 - 5.1 mmol/L 4.5  Chloride 98 - 111 mmol/L 100  CO2 22 - 32 mmol/L 26  Calcium 8.9 - 10.3 mg/dL 9.7  Total Protein 6.5 - 8.1 g/dL 7.7  Total Bilirubin 0.3 - 1.2 mg/dL 0.6  Alkaline Phos 38 - 126 U/L 51  AST 15 - 41 U/L 14(L)  ALT 0 - 44 U/L 25   CBC Latest Ref Rng & Units 03/07/2019  WBC 4.0 - 10.5 K/uL 9.7   Hemoglobin 13.0 - 17.0 g/dL 16.1  Hematocrit 39.0 - 52.0 % 46.8  Platelets 150 - 400 K/uL 294    Dg Shoulder Right  Result Date: 02/19/2019 CLINICAL DATA:  Right arm and shoulder pain.  No known injury. EXAM: RIGHT SHOULDER - 2+ VIEW COMPARISON:  None FINDINGS: Degenerative changes in the [facility] joint with joint space narrowing and spurring. Glenohumeral joint is maintained. No acute bony abnormality. Specifically, no fracture, subluxation, or dislocation. Soft tissues are intact. IMPRESSION: Degenerative changes in the right AC joint. No acute bony abnormality. Electronically Signed   By: Rolm Baptise M.D.   On: 02/19/2019 12:07   Nm Pet Image Initial (pi) Skull Base To Thigh  Result Date: 02/11/2019 CLINICAL DATA:  Initial treatment strategy for follicular lymphoma. EXAM: NUCLEAR MEDICINE PET SKULL BASE TO THIGH TECHNIQUE: 12.2 mCi F-18 FDG was injected intravenously. Full-ring PET imaging was performed from the skull base to thigh after the radiotracer. CT data was obtained and used for attenuation correction and anatomic localization. Fasting blood glucose: 134 mg/dl COMPARISON:  Multiple exams, including CT abdomen 01/30/2019 FINDINGS: Mediastinal blood pool activity: SUV max 2.1 Liver activity: SUV max 3.9 NECK: Minimal accentuated activity in the vicinity of the left medial pterygoid plate adjacent to a supernumerary tooth along the posterior maxilla. No corresponding CT abnormality, maximum SUV 5.5. This is likely incidental. Incidental CT findings: none CHEST: Left axillary node measuring 0.7 cm in short axis on image 84/3 has a maximum SUV of 3.3, Deauville 3. Incidental CT findings: Mild atelectasis along the right hemidiaphragm. ABDOMEN/PELVIS: Large conglomerate retroperitoneal and mesenteric mass, measuring about 18.3 by 8.7 cm, maximum SUV 10.6, compatible with Deauville 5 disease. This partially surrounds the pancreas, extends along the splenic hilum, and extends into the left perirenal  space but in the left kidney. There are individually enlarged and hypermetabolic pelvic lymph nodes especially on the right, including a right common iliac node measuring 1.4 cm in short axis on image 216/3 with maximum SUV 7.8 (Deauville 4), and a right external iliac node measuring 2.0 cm in short axis on image 243/3 with maximum SUV 5.4, Deauville 4.  There is a small right inguinal focus of accentuated metabolic activity and stranding in the subcutaneous tissues, maximum SUV 4.1, without a well-defined lymph node. Incidental CT findings: Cholecystectomy. No hydronephrosis. The spleen measures 12.6 by 5.7 by 13.0 cm (volume = 490 cm^3), without focal hypermetabolic activity. Postoperative findings in the rectum. Punctate calcifications along small scrotal hydroceles bilaterally. SKELETON: No significant abnormal hypermetabolic activity in this region. Incidental CT findings: Chronic bilateral pars defects at L5 with grade 2 anterolisthesis. Mesoacromial os acromiale on the left. IMPRESSION: 1. The large conglomerate retroperitoneal and mesenteric mass of the maximum SUV of 10.6 compatible with Deauville 5 disease. There is Deauville 4 adenopathy in the pelvis. 2. A left axillary lymph node measuring 0.7 cm in short axis has a maximum SUV of 3.3, compatible with Deauville 3 disease. 3. Minimally accentuated activity in the vicinity of the left medial pterygoid plate, without corresponding CT abnormality, probably incidental. 4. Mild splenomegaly, without focal splenic lesion identified. 5. Chronic pars defects at L5 with grade 2 anterolisthesis. 6. Mild atelectasis along the right hemidiaphragm. Electronically Signed   By: Van Clines M.D.   On: 02/11/2019 12:41   Ct Bone Marrow Biopsy & Aspiration  Result Date: 02/21/2019 CLINICAL DATA:  Abdominal lymphoma EXAM: CT GUIDED DEEP ILIAC BONE ASPIRATION AND CORE BIOPSY TECHNIQUE: Patient was placed prone on the CT gantry and limited axial scans through the  pelvis were obtained. Appropriate skin entry site was identified. Skin site was marked, prepped with chlorhexidine, draped in usual sterile fashion, and infiltrated locally with 1% lidocaine. Intravenous Fentanyl 151mg and Versed 371mwere administered as conscious sedation during continuous monitoring of the patient's level of consciousness and physiological / cardiorespiratory status by the radiology RN, with a total moderate sedation time of 8 minutes. Under CT fluoroscopic guidance an 11-gauge Cook trocar bone needle was advanced into the left iliac bone just lateral to the sacroiliac joint. Once needle tip position was confirmed, core and aspiration samples were obtained, submitted to pathology for approval. Post procedure scans show no hematoma or fracture. Patient tolerated procedure well. COMPLICATIONS: COMPLICATIONS none IMPRESSION: 1. Technically successful CT guided left iliac bone core and aspiration biopsy. Electronically Signed   By: D Lucrezia Europe.D.   On: 02/21/2019 10:34   Vas [place]ower Extremity Venous (dvt)  Result Date: 02/07/2019  Lower Venous Study Other Indications: Follow-up DVT. Risk Factors: DVT 01/30/2019:Lt LE DVT. Comparison Study: 01/30/2019 Performing Technologist: SoAlmira CoasterVS  Examination Guidelines: A complete evaluation includes B-mode imaging, spectral Doppler, color Doppler, and power Doppler as needed of all accessible portions of each vessel. Bilateral testing is considered an integral part of a complete examination. Limited examinations for reoccurring indications may be performed as noted.  +-----+---------------+---------+-----------+----------+--------------+ RIGHTCompressibilityPhasicitySpontaneityPropertiesThrombus Aging +-----+---------------+---------+-----------+----------+--------------+ CFV  Full           Yes      Yes                                 +-----+---------------+---------+-----------+----------+--------------+    +---------+---------------+---------+-----------+----------+--------------+ LEFT     CompressibilityPhasicitySpontaneityPropertiesThrombus Aging +---------+---------------+---------+-----------+----------+--------------+ CFV      Partial        Yes      Yes                                 +---------+---------------+---------+-----------+----------+--------------+ SFJ      Full  Yes      Yes                                 +---------+---------------+---------+-----------+----------+--------------+ FV Prox  Full           Yes      Yes                                 +---------+---------------+---------+-----------+----------+--------------+ FV Mid   Partial        Yes      Yes                                 +---------+---------------+---------+-----------+----------+--------------+ FV DistalFull           Yes      Yes                                 +---------+---------------+---------+-----------+----------+--------------+ PFV      Full           Yes      Yes                                 +---------+---------------+---------+-----------+----------+--------------+ POP      Full           Yes      Yes                                 +---------+---------------+---------+-----------+----------+--------------+ PTV      Full           Yes      Yes                                 +---------+---------------+---------+-----------+----------+--------------+ PERO     Full           Yes      Yes                                 +---------+---------------+---------+-----------+----------+--------------+ GSV      Full           Yes      Yes                                 +---------+---------------+---------+-----------+----------+--------------+ Paired Veins seen in the SFV Prox/Mid through Mid segment; One(1) of them displaying signs of An Acute Process of DVT.    Summary: Left: Findings consistent with acute deep vein thrombosis involving  the left femoral vein. Findings consistent with chronic deep vein thrombosis involving the left common femoral vein. There is no evidence of superficial venous thrombosis.There is no evidence of chronic venous insufficiency.  *See table(s) above for measurements and observations. Electronically signed by Hortencia Pilar MD on 02/07/2019 at 4:42:55 PM.    Final      Assessment and plan 1. Follicular lymphoma of intra-abdominal lymph nodes, unspecified follicular lymphoma type (HCC)   2. Elevated BUN   3.  Other acute pulmonary embolism with acute cor pulmonale (HCC)    Stage III follicular lymphoma Status post cycle 1 BR Tolerates well. He is taking allopurinol for tumor lysis prophylaxis. Labs reviewed and discussed with patient. BUN slightly high, creatinine 1.22.  We will proceed with 1 L of IV fluid for hydration, tumor lysis prophylaxis given the bulky size.  Acute pulmonary embolism,  Continue Eliquis 5 mg twice daily.  Tolerating well.  #Patient will have Mediport placement next week. Repeat blood work in 1 week.  Follow up in 2 week for assessment of tolerability.   Earlie Server, MD, PhD Hematology Oncology [facility] at [facility] Pager- [phone] 03/07/2019

## 2019-03-11 ENCOUNTER — Ambulatory Visit: Payer: BC Managed Care – PPO | Admitting: Dietician

## 2019-03-11 ENCOUNTER — Other Ambulatory Visit: Payer: BC Managed Care – PPO

## 2019-03-11 ENCOUNTER — Encounter: Payer: Self-pay | Admitting: *Deleted

## 2019-03-11 ENCOUNTER — Encounter
Admission: RE | Disposition: A | Discharge status: Home / Self Care | Payer: Self-pay | Source: Home / Self Care | Attending: Vascular Surgery

## 2019-03-11 ENCOUNTER — Encounter: Payer: Self-pay | Admitting: Dietician

## 2019-03-11 ENCOUNTER — Other Ambulatory Visit: Payer: Self-pay

## 2019-03-11 ENCOUNTER — Other Ambulatory Visit (INDEPENDENT_AMBULATORY_CARE_PROVIDER_SITE_OTHER): Payer: Self-pay | Admitting: Nurse Practitioner

## 2019-03-11 ENCOUNTER — Ambulatory Visit
Admission: RE | Admit: 2019-03-11 | Discharge: 2019-03-11 | Disposition: A | Discharge status: Home / Self Care | Payer: BC Managed Care – PPO | Attending: Vascular Surgery | Admitting: Vascular Surgery

## 2019-03-11 VITALS — Ht 72.0 in | Wt 210.0 lb

## 2019-03-11 DIAGNOSIS — Z7984 Long term (current) use of oral hypoglycemic drugs: Secondary | ICD-10-CM | POA: Diagnosis not present

## 2019-03-11 DIAGNOSIS — C8590 Non-Hodgkin lymphoma, unspecified, unspecified site: Secondary | ICD-10-CM | POA: Diagnosis not present

## 2019-03-11 DIAGNOSIS — C8200 Follicular lymphoma grade I, unspecified site: Secondary | ICD-10-CM

## 2019-03-11 DIAGNOSIS — Z7901 Long term (current) use of anticoagulants: Secondary | ICD-10-CM | POA: Insufficient documentation

## 2019-03-11 DIAGNOSIS — E119 Type 2 diabetes mellitus without complications: Secondary | ICD-10-CM | POA: Diagnosis not present

## 2019-03-11 DIAGNOSIS — I2609 Other pulmonary embolism with acute cor pulmonale: Secondary | ICD-10-CM | POA: Insufficient documentation

## 2019-03-11 DIAGNOSIS — C8293 Follicular lymphoma, unspecified, intra-abdominal lymph nodes: Secondary | ICD-10-CM | POA: Diagnosis not present

## 2019-03-11 DIAGNOSIS — Z79899 Other long term (current) drug therapy: Secondary | ICD-10-CM | POA: Insufficient documentation

## 2019-03-11 HISTORY — PX: PORTA CATH INSERTION: CATH118285

## 2019-03-11 SURGERY — PORTA CATH INSERTION
Anesthesia: Moderate Sedation

## 2019-03-11 MED ORDER — METHYLPREDNISOLONE SODIUM SUCC 125 MG IJ SOLR: 125.0000 mg | Freq: Once | INTRAMUSCULAR | Status: DC | PRN

## 2019-03-11 MED ORDER — HYDROMORPHONE HCL 1 MG/ML IJ SOLN: 1.0000 mg | Freq: Once | INTRAMUSCULAR | Status: DC | PRN

## 2019-03-11 MED ORDER — FENTANYL CITRATE (PF) 100 MCG/2ML IJ SOLN
INTRAMUSCULAR | Status: DC | PRN
  Administered 2019-03-11 (×2): 50 ug via INTRAVENOUS

## 2019-03-11 MED ORDER — CLINDAMYCIN PHOSPHATE 300 MG/50ML IV SOLN
300.0000 mg | Freq: Once | INTRAVENOUS | Status: AC
  Administered 2019-03-11: 300 mg via INTRAVENOUS

## 2019-03-11 MED ORDER — MIDAZOLAM HCL 2 MG/2ML IJ SOLN
INTRAMUSCULAR | Status: DC | PRN
  Administered 2019-03-11 (×2): 2 mg via INTRAVENOUS

## 2019-03-11 MED ORDER — MIDAZOLAM HCL 2 MG/2ML IJ SOLN
INTRAMUSCULAR | Status: AC
  Filled 2019-03-11: qty 2

## 2019-03-11 MED ORDER — FAMOTIDINE 20 MG PO TABS: 40.0000 mg | ORAL_TABLET | Freq: Once | ORAL | Status: DC | PRN

## 2019-03-11 MED ORDER — CLINDAMYCIN PHOSPHATE 300 MG/50ML IV SOLN
INTRAVENOUS | Status: AC
  Filled 2019-03-11: qty 50

## 2019-03-11 MED ORDER — ONDANSETRON HCL 4 MG/2ML IJ SOLN: 4.0000 mg | Freq: Four times a day (QID) | INTRAMUSCULAR | Status: DC | PRN

## 2019-03-11 MED ORDER — SODIUM CHLORIDE 0.9 % IV SOLN
Freq: Once | INTRAVENOUS | Status: DC
  Filled 2019-03-11: qty 2

## 2019-03-11 MED ORDER — DIPHENHYDRAMINE HCL 50 MG/ML IJ SOLN: 50.0000 mg | Freq: Once | INTRAMUSCULAR | Status: DC | PRN

## 2019-03-11 MED ORDER — SODIUM CHLORIDE 0.9 % IV SOLN: INTRAVENOUS | Status: DC

## 2019-03-11 MED ORDER — MIDAZOLAM HCL 2 MG/ML PO SYRP: 8.0000 mg | ORAL_SOLUTION | Freq: Once | ORAL | Status: DC | PRN

## 2019-03-11 MED ORDER — FENTANYL CITRATE (PF) 100 MCG/2ML IJ SOLN
INTRAMUSCULAR | Status: AC
  Filled 2019-03-11: qty 2

## 2019-03-11 SURGICAL SUPPLY — 8 items
ADH SKN CLS APL DERMABOND .7 (GAUZE/BANDAGES/DRESSINGS) ×1
DERMABOND ADVANCED (GAUZE/BANDAGES/DRESSINGS) ×1
DERMABOND ADVANCED .7 DNX12 (GAUZE/BANDAGES/DRESSINGS) IMPLANT
KIT PORT POWER 8FR ISP CVUE (Port) ×1 IMPLANT
PACK ANGIOGRAPHY (CUSTOM PROCEDURE TRAY) ×2 IMPLANT
PENCIL ELECTRO HAND CTR (MISCELLANEOUS) ×2 IMPLANT
SUT MNCRL AB 4-0 PS2 18 (SUTURE) ×2 IMPLANT
SUT PROLENE 0 CT 1 30 (SUTURE) ×2 IMPLANT

## 2019-03-11 NOTE — Progress Notes (Signed)

## 2019-03-11 NOTE — Op Note (Signed)
      [facility]       Operative Note  Date: 03/11/2019  Preoperative diagnosis:  1. Lymphoma  Postoperative diagnosis:  Same as above  Procedures: #1. Ultrasound guidance for vascular access to the right internal jugular vein. #2. Fluoroscopic guidance for placement of catheter. #3. Placement of CT compatible Port-A-Cath, right internal jugular vein.  Surgeon: Leotis Pain, MD.   Anesthesia: Local with moderate conscious sedation for approximately 20 minutes using 4 mg of Versed and 100 mcg of Fentanyl  Fluoroscopy time: less than 1 minute  Contrast used: 0  Estimated blood loss: 3 cc  Indication for the procedure:  The patient is a 58 y.o.male with lymphoma.  The patient needs a Port-A-Cath for durable venous access, chemotherapy, lab draws, and CT scans. We are asked to place this. Risks and benefits were discussed and informed consent was obtained.  Description of procedure: The patient was brought to the vascular and interventional radiology suite.  Moderate conscious sedation was administered throughout the procedure during a face to face encounter with the patient with my supervision of the RN administering medicines and monitoring the patient's vital signs, pulse oximetry, telemetry and mental status throughout from the start of the procedure until the patient was taken to the recovery room. The right neck chest and shoulder were sterilely prepped and draped, and a sterile surgical field was created. Ultrasound was used to help visualize a patent right internal jugular vein. This was then accessed under direct ultrasound guidance without difficulty with the Seldinger needle and a permanent image was recorded. A J-wire was placed. After skin nick and dilatation, the peel-away sheath was then placed over the wire. I then anesthetized an area under the clavicle approximately 1-2 fingerbreadths. A transverse incision was created and an inferior pocket was created  with electrocautery and blunt dissection. The port was then brought onto the field, placed into the pocket and secured to the chest wall with 2 Prolene sutures. The catheter was connected to the port and tunneled from the subclavicular incision to the access site. Fluoroscopic guidance was then used to cut the catheter to an appropriate length. The catheter was then placed through the peel-away sheath and the peel-away sheath was removed. The catheter tip was parked in excellent location under fluorocoscopic guidance in the cavoatrial junction. The pocket was then irrigated with antibiotic impregnated saline and the wound was closed with a running 3-0 Vicryl and a 4-0 Monocryl. The access incision was closed with a single 4-0 Monocryl. The Huber needle was used to withdraw blood and flush the port with heparinized saline. Dermabond was then placed as a dressing. The patient tolerated the procedure well and was taken to the recovery room in stable condition.   Leotis Pain 03/11/2019 12:50 PM   This note was created with Dragon Medical transcription system. Any errors in dictation are purely unintentional.

## 2019-03-11 NOTE — Discharge Instructions (Signed)
Implanted Port Insertion, Care After This sheet gives you information about how to care for yourself after your procedure. Your health care provider may also give you more specific instructions. If you have problems or questions, contact your health care provider. What can I expect after the procedure? After the procedure, it is common to have:  Discomfort at the port insertion site.  Bruising on the skin over the port. This should improve over 3-4 days. Follow these instructions at home: Port care  After your port is placed, you will get a manufacturer's information card. The card has information about your port. Keep this card with you at all times.  Take care of the port as told by your health care provider. Ask your health care provider if you or a family member can get training for taking care of the port at home. A home health care nurse may also take care of the port.  Make sure to remember what type of port you have. Incision care      Follow instructions from your health care provider about how to take care of your port insertion site. Make sure you: ? Wash your hands with soap and water before and after you change your bandage (dressing). If soap and water are not available, use hand sanitizer. ? Change your dressing as told by your health care provider. ? Leave stitches (sutures), skin glue, or adhesive strips in place. These skin closures may need to stay in place for 2 weeks or longer. If adhesive strip edges start to loosen and curl up, you may trim the loose edges. Do not remove adhesive strips completely unless your health care provider tells you to do that.  Check your port insertion site every day for signs of infection. Check for: ? Redness, swelling, or pain. ? Fluid or blood. ? Warmth. ? Pus or a bad smell. Activity  Return to your normal activities as told by your health care provider. Ask your health care provider what activities are safe for you.  Do not  lift anything that is heavier than 10 lb (4.5 kg), or the limit that you are told, until your health care provider says that it is safe. General instructions  Take over-the-counter and prescription medicines only as told by your health care provider.  Do not take baths, swim, or use a hot tub until your health care provider approves. Ask your health care provider if you may take showers. You may only be allowed to take sponge baths.  Do not drive for 24 hours if you were given a sedative during your procedure.  Wear a medical alert bracelet in case of an emergency. This will tell any health care providers that you have a port.  Keep all follow-up visits as told by your health care provider. This is important. Contact a health care provider if:  You cannot flush your port with saline as directed, or you cannot draw blood from the port.  You have a fever or chills.  You have redness, swelling, or pain around your port insertion site.  You have fluid or blood coming from your port insertion site.  Your port insertion site feels warm to the touch.  You have pus or a bad smell coming from the port insertion site. Get help right away if:  You have chest pain or shortness of breath.  You have bleeding from your port that you cannot control. Summary  Take care of the port as told by your health   care provider. Keep the manufacturer's information card with you at all times.  Change your dressing as told by your health care provider.  Contact a health care provider if you have a fever or chills or if you have redness, swelling, or pain around your port insertion site.  Keep all follow-up visits as told by your health care provider. This information is not intended to replace advice given to you by your health care provider. Make sure you discuss any questions you have with your health care provider. Document Released: 02/06/2013 Document Revised: 11/14/2017 Document Reviewed: 11/14/2017  Elsevier Patient Education  2020 [employer]  

## 2019-03-11 NOTE — Progress Notes (Signed)
Patient clinically stable post Port placement per Dr Lucky Cowboy, tolerated well. Vitals stable, awake alert and oriented post procedure. Denies complaints at this time. Discharge instructions given with questions answered. Dr Lucky Cowboy out to speak with patient with questions answered regarding procedure.

## 2019-03-11 NOTE — H&P (Signed)
[employer] [facility] History & Physical Update  The patient was interviewed and re-examined.  The patient's previous History and Physical has been reviewed and is unchanged.  There is no change in the plan of care. We plan to proceed with the scheduled procedure.  Leotis Pain, MD  03/11/2019, 12:58 PM

## 2019-03-12 ENCOUNTER — Encounter: Payer: Self-pay | Admitting: Vascular Surgery

## 2019-03-13 ENCOUNTER — Other Ambulatory Visit: Payer: Self-pay

## 2019-03-14 ENCOUNTER — Other Ambulatory Visit: Payer: BC Managed Care – PPO

## 2019-03-14 ENCOUNTER — Inpatient Hospital Stay: Payer: BC Managed Care – PPO

## 2019-03-14 ENCOUNTER — Other Ambulatory Visit: Payer: Self-pay

## 2019-03-14 DIAGNOSIS — C8293 Follicular lymphoma, unspecified, intra-abdominal lymph nodes: Secondary | ICD-10-CM | POA: Diagnosis not present

## 2019-03-14 LAB — BASIC METABOLIC PANEL
Anion gap: 7 (ref 5–15)
BUN: 21 mg/dL — ABNORMAL HIGH (ref 6–20)
CO2: 26 mmol/L (ref 22–32)
Calcium: 9.9 mg/dL (ref 8.9–10.3)
Chloride: 103 mmol/L (ref 98–111)
Creatinine, Ser: 1.23 mg/dL (ref 0.61–1.24)
GFR calc Af Amer: >60 mL/min (ref >60)
GFR calc non Af Amer: >60 mL/min (ref >60)
Glucose, Bld: 101 mg/dL — ABNORMAL HIGH (ref 70–99)
Potassium: 4.2 mmol/L (ref 3.5–5.1)
Sodium: 136 mmol/L (ref 135–145)

## 2019-03-15 ENCOUNTER — Other Ambulatory Visit: Payer: Self-pay | Admitting: Oncology

## 2019-03-18 ENCOUNTER — Encounter: Payer: BC Managed Care – PPO | Attending: Internal Medicine | Admitting: Dietician

## 2019-03-18 ENCOUNTER — Other Ambulatory Visit: Payer: Self-pay

## 2019-03-18 ENCOUNTER — Encounter: Payer: Self-pay | Admitting: Dietician

## 2019-03-18 VITALS — Wt 209.3 lb

## 2019-03-18 DIAGNOSIS — Z713 Dietary counseling and surveillance: Secondary | ICD-10-CM | POA: Diagnosis not present

## 2019-03-18 DIAGNOSIS — E1165 Type 2 diabetes mellitus with hyperglycemia: Secondary | ICD-10-CM | POA: Insufficient documentation

## 2019-03-18 DIAGNOSIS — E119 Type 2 diabetes mellitus without complications: Secondary | ICD-10-CM

## 2019-03-18 DIAGNOSIS — Z6829 Body mass index (BMI) 29.0-29.9, adult: Secondary | ICD-10-CM | POA: Insufficient documentation

## 2019-03-18 NOTE — Progress Notes (Signed)

## 2019-03-21 ENCOUNTER — Ambulatory Visit: Payer: BC Managed Care – PPO

## 2019-03-21 ENCOUNTER — Ambulatory Visit: Payer: BC Managed Care – PPO | Admitting: Oncology

## 2019-03-21 ENCOUNTER — Other Ambulatory Visit: Payer: BC Managed Care – PPO

## 2019-03-22 ENCOUNTER — Ambulatory Visit: Payer: BC Managed Care – PPO

## 2019-03-25 ENCOUNTER — Encounter: Payer: Self-pay | Admitting: Dietician

## 2019-03-25 ENCOUNTER — Other Ambulatory Visit: Payer: Self-pay

## 2019-03-25 NOTE — Progress Notes (Signed)
Patient rescheduled diabetes class 3 from today to 04/22/19

## 2019-04-01 ENCOUNTER — Inpatient Hospital Stay: Payer: BC Managed Care – PPO

## 2019-04-01 ENCOUNTER — Inpatient Hospital Stay (HOSPITAL_BASED_OUTPATIENT_CLINIC_OR_DEPARTMENT_OTHER): Payer: BC Managed Care – PPO | Admitting: Oncology

## 2019-04-01 ENCOUNTER — Other Ambulatory Visit: Payer: Self-pay

## 2019-04-01 ENCOUNTER — Encounter: Payer: Self-pay | Admitting: Oncology

## 2019-04-01 VITALS — BP 128/84 | HR 83 | Temp 99.0°F | Resp 16 | Wt 210.0 lb

## 2019-04-01 DIAGNOSIS — I2609 Other pulmonary embolism with acute cor pulmonale: Secondary | ICD-10-CM

## 2019-04-01 DIAGNOSIS — C8293 Follicular lymphoma, unspecified, intra-abdominal lymph nodes: Secondary | ICD-10-CM | POA: Diagnosis not present

## 2019-04-01 DIAGNOSIS — Z95828 Presence of other vascular implants and grafts: Secondary | ICD-10-CM

## 2019-04-01 DIAGNOSIS — C8280 Other types of follicular lymphoma, unspecified site: Secondary | ICD-10-CM

## 2019-04-01 DIAGNOSIS — Z5111 Encounter for antineoplastic chemotherapy: Secondary | ICD-10-CM | POA: Diagnosis not present

## 2019-04-01 LAB — COMPREHENSIVE METABOLIC PANEL
ALT: 39 U/L (ref 0–44)
AST: 21 U/L (ref 15–41)
Albumin: 4.3 g/dL (ref 3.5–5.0)
Alkaline Phosphatase: 49 U/L (ref 38–126)
Anion gap: 7 (ref 5–15)
BUN: 23 mg/dL — ABNORMAL HIGH (ref 6–20)
CO2: 25 mmol/L (ref 22–32)
Calcium: 9.5 mg/dL (ref 8.9–10.3)
Chloride: 105 mmol/L (ref 98–111)
Creatinine, Ser: 1.01 mg/dL (ref 0.61–1.24)
GFR calc Af Amer: >60 mL/min (ref >60)
GFR calc non Af Amer: >60 mL/min (ref >60)
Glucose, Bld: 89 mg/dL (ref 70–99)
Potassium: 4.1 mmol/L (ref 3.5–5.1)
Sodium: 137 mmol/L (ref 135–145)
Total Bilirubin: 0.5 mg/dL (ref 0.3–1.2)
Total Protein: 7.5 g/dL (ref 6.5–8.1)

## 2019-04-01 LAB — CBC WITH DIFFERENTIAL/PLATELET
Abs Immature Granulocytes: 0.08 10*3/uL — ABNORMAL HIGH (ref 0.00–0.07)
Basophils Absolute: 0.1 10*3/uL (ref 0.0–0.1)
Basophils Relative: 1 %
Eosinophils Absolute: 0.1 10*3/uL (ref 0.0–0.5)
Eosinophils Relative: 1 %
HCT: 46.6 % (ref 39.0–52.0)
Hemoglobin: 16 g/dL (ref 13.0–17.0)
Immature Granulocytes: 1 %
Lymphocytes Relative: 21 %
Lymphs Abs: 1.9 10*3/uL (ref 0.7–4.0)
MCH: 29.6 pg (ref 26.0–34.0)
MCHC: 34.3 g/dL (ref 30.0–36.0)
MCV: 86.3 fL (ref 80.0–100.0)
Monocytes Absolute: 0.7 10*3/uL (ref 0.1–1.0)
Monocytes Relative: 8 %
Neutro Abs: 6.2 10*3/uL (ref 1.7–7.7)
Neutrophils Relative %: 68 %
Platelets: 197 10*3/uL (ref 150–400)
RBC: 5.4 MIL/uL (ref 4.22–5.81)
RDW: 12.5 % (ref 11.5–15.5)
WBC: 9.1 10*3/uL (ref 4.0–10.5)
nRBC: 0 % (ref 0.0–0.2)

## 2019-04-01 MED ORDER — SODIUM CHLORIDE 0.9 % IV SOLN
375.0000 mg/m2 | Freq: Once | INTRAVENOUS | Status: AC
  Administered 2019-04-01: 800 mg via INTRAVENOUS
  Filled 2019-04-01: qty 50

## 2019-04-01 MED ORDER — PALONOSETRON HCL INJECTION 0.25 MG/5ML
0.2500 mg | Freq: Once | INTRAVENOUS | Status: AC
  Administered 2019-04-01: 0.25 mg via INTRAVENOUS
  Filled 2019-04-01: qty 5

## 2019-04-01 MED ORDER — ACETAMINOPHEN 325 MG PO TABS
650.0000 mg | ORAL_TABLET | Freq: Once | ORAL | Status: AC
  Administered 2019-04-01: 650 mg via ORAL
  Filled 2019-04-01: qty 2

## 2019-04-01 MED ORDER — PALONOSETRON HCL INJECTION 0.25 MG/5ML: 0.2500 mg | Freq: Once | INTRAVENOUS | Status: DC

## 2019-04-01 MED ORDER — HEPARIN SOD (PORK) LOCK FLUSH 100 UNIT/ML IV SOLN
500.0000 [IU] | Freq: Once | INTRAVENOUS | Status: DC
  Filled 2019-04-01: qty 5

## 2019-04-01 MED ORDER — DIPHENHYDRAMINE HCL 25 MG PO CAPS
50.0000 mg | ORAL_CAPSULE | Freq: Once | ORAL | Status: AC
  Administered 2019-04-01: 50 mg via ORAL
  Filled 2019-04-01: qty 2

## 2019-04-01 MED ORDER — HEPARIN SOD (PORK) LOCK FLUSH 100 UNIT/ML IV SOLN
500.0000 [IU] | Freq: Once | INTRAVENOUS | Status: AC | PRN
  Administered 2019-04-01: 500 [IU]
  Filled 2019-04-01: qty 5

## 2019-04-01 MED ORDER — SODIUM CHLORIDE 0.9% FLUSH
10.0000 mL | Freq: Once | INTRAVENOUS | Status: AC
  Administered 2019-04-01: 10 mL via INTRAVENOUS
  Filled 2019-04-01: qty 10

## 2019-04-01 MED ORDER — SODIUM CHLORIDE 0.9 % IV SOLN
Freq: Once | INTRAVENOUS | Status: AC
  Administered 2019-04-01: 09:00:00 via INTRAVENOUS
  Filled 2019-04-01: qty 250

## 2019-04-01 MED ORDER — SODIUM CHLORIDE 0.9 % IV SOLN
90.0000 mg/m2 | Freq: Once | INTRAVENOUS | Status: AC
  Administered 2019-04-01: 200 mg via INTRAVENOUS
  Filled 2019-04-01: qty 8

## 2019-04-01 NOTE — Progress Notes (Signed)
Patient does not offer any problems today.  

## 2019-04-01 NOTE — Progress Notes (Signed)
Hematology/Oncology Follow Up Note [facility]  [phone] [phone]  Patient Care Team: Rusty Aus, MD as PCP - General (Internal Medicine)   Name of the patient: Edward Atkinson  [id number]  04-24-1961   REASON FOR VISIT  follow-up for management of pulmonary embolism, follicular cell lymphoma.  PERTINENT ONCOLOGY HISTORY # 01/31/2019 Unprovoked PE,  CT chest PE protocol reviewed bilateral massive pulmonary embolism with right heart strain and patient was started on heparin drip.  Patient was tachypneic and tachycardia.  Vascular surgeon was consulted and the patient was taken to the OR for thrombectomy and thrombolysis.  Patient also had selective catheter placement right upper lobe, middle lobe, lower lobe pulmonary arteries, left upper and lower pulmonary arteries. Ultrasound venous duplex bilateral showed DVT of the left lower extremity with nonocclusive thrombus in the left common femoral vein.  Some of the common femoral venous thrombus does extend just into the GSV across SF J.  No evidence of right lower extremity DVT. Patient was started on heparin drip for anticoagulation. Patient is status post embolectomy by vascular surgery. #Additional CT pancreas was done during that admission which showed peripancreatic/retroperitoneal and contiguous small bowel mesentery nodal mass, retroperitoneal adenopathy, gastrohepatic ligament lymphadenopathy, left abdominal omental nodule CT-guided omentum biopsy was done during hospital.  Patient was switched to Lovenox at discharge.  #  PET scan done on 02/11/2019 which showed large conglomerate retroperitoneal and mesenteric mass with maximal SUV 10.6 compatible with Deauville 5 disease.  Deauville 4 adenopathy in the pelvis. A left axillary lymph node measuring 0.7 cm in short axis has a maximum SUV of 3.3. Mild splenomegaly without focal splenic lesion identified. #02/21/2019, bone marrow biopsy  showed no lymphoma involvement.  There might be lymphocyte aggregates that were being cut away on the deeper levels.  Additional IHC did not show residual echograms.  INTERVAL HISTORY 58 y.o. male with newly diagnosed pulmonary embolism and follicular lymphoma present for follow-up-evaluation prior to cycle 2 BR Today he reports feeling well. Denies any constitutional symptoms. Status post cycle 1 BR 4 weeks ago.  He tolerates well No new complaints today.   Review of Systems  Constitutional: Negative for appetite change, chills, fatigue, fever and unexpected weight change.  HENT:   Negative for hearing loss and voice change.   Eyes: Negative for eye problems and icterus.  Respiratory: Negative for chest tightness, cough and shortness of breath.   Cardiovascular: Negative for chest pain and leg swelling.  Gastrointestinal: Negative for abdominal distention and abdominal pain.  Endocrine: Negative for hot flashes.  Genitourinary: Negative for difficulty urinating, dysuria and frequency.   Musculoskeletal: Negative for arthralgias.  Skin: Negative for itching and rash.  Neurological: Negative for light-headedness and numbness.  Hematological: Negative for adenopathy. Does not bruise/bleed easily.  Psychiatric/Behavioral: Negative for confusion.      Allergies  Allergen Reactions  . Penicillins Rash     Past Medical History:  Diagnosis Date  . Diabetes mellitus without complication ([place])   . Diverticulitis   . Follicular lymphoma of intra-abdominal lymph nodes ([place]) 02/15/2019     Past Surgical History:  Procedure Laterality Date  . CHOLECYSTECTOMY    . COLON RESECTION    . PORTA CATH INSERTION N/A 03/11/2019   Procedure: PORTA CATH INSERTION;  Surgeon: Algernon Huxley, MD;  Location: [place] [facility];  Service: Cardiovascular;  Laterality: N/A;  . PULMONARY THROMBECTOMY N/A 01/29/2019   Procedure: PULMONARY THROMBECTOMY;  Surgeon: Algernon Huxley, MD;  Location: [place]  [facility];  Service: Cardiovascular;  Laterality: N/A;    Social History   Socioeconomic History  . Marital status: Married    Spouse name: Not on file  . Number of children: Not on file  . Years of education: Not on file  . Highest education level: Not on file  Occupational History  . Not on file  Social Needs  . Financial resource strain: Not on file  . Food insecurity    Worry: Not on file    Inability: Not on file  . Transportation needs    Medical: Not on file    Non-medical: Not on file  Tobacco Use  . Smoking status: Never Smoker  . Smokeless tobacco: Never Used  Substance and Sexual Activity  . Alcohol use: Yes    Alcohol/week: 2.0 standard drinks    Types: 2 Cans of beer per week    Comment: occasional  . Drug use: Never  . Sexual activity: Yes  Lifestyle  . Physical activity    Days per week: Not on file    Minutes per session: Not on file  . Stress: Not on file  Relationships  . Social Herbalist on phone: Not on file    Gets together: Not on file    Attends religious service: Not on file    Active member of club or organization: Not on file    Attends meetings of clubs or organizations: Not on file    Relationship status: Not on file  . Intimate partner violence    Fear of current or ex partner: Not on file    Emotionally abused: Not on file    Physically abused: Not on file    Forced sexual activity: Not on file  Other Topics Concern  . Not on file  Social History Narrative  . Not on file    Family History  Problem Relation Age of Onset  . Diabetes Brother      Current Outpatient Medications:  .  allopurinol (ZYLOPRIM) 300 MG tablet, Take 1 tablet (300 mg total) by mouth daily., Disp: 30 tablet, Rfl: 3 .  apixaban (ELIQUIS) 5 MG TABS tablet, Take 1 tablet (5 mg total) by mouth 2 (two) times daily., Disp: 60 tablet, Rfl: 11 .  glimepiride (AMARYL) 4 MG tablet, Take 4 mg by mouth daily with breakfast., Disp: , Rfl:  .   lidocaine-prilocaine (EMLA) cream, Apply 1 application topically as needed. (Patient not taking: Reported on 03/06/2019), Disp: 30 g, Rfl: 0 .  prochlorperazine (COMPAZINE) 10 MG tablet, Take 1 tablet (10 mg total) by mouth every 6 (six) hours as needed (Nausea or vomiting)., Disp: 30 tablet, Rfl: 1 No current facility-administered medications for this visit.   Facility-Administered Medications Ordered in Other Visits:  .  heparin lock flush 100 unit/mL, 500 Units, Intravenous, Once, Earlie Server, MD  Physical exam:  Vitals:   04/01/19 0833  BP: 128/84  Pulse: 83  Resp: 16  Temp: 99 F (37.2 C)  Weight: 210 lb (95.3 kg)   Physical Exam Constitutional:      General: He is not in acute distress. HENT:     Head: Normocephalic and atraumatic.  Eyes:     General: No scleral icterus.    Pupils: Pupils are equal, round, and reactive to light.  Neck:     Musculoskeletal: Normal range of motion and neck supple.  Cardiovascular:     Rate and Rhythm: Normal rate and regular  rhythm.     Heart sounds: Normal heart sounds.  Pulmonary:     Effort: Pulmonary effort is normal. No respiratory distress.     Breath sounds: No wheezing.  Abdominal:     General: Bowel sounds are normal. There is no distension.     Palpations: Abdomen is soft. There is no mass.     Tenderness: There is no abdominal tenderness.  Musculoskeletal: Normal range of motion.        General: No deformity.  Skin:    General: Skin is warm and dry.     Findings: No erythema or rash.  Neurological:     Mental Status: He is alert and oriented to person, place, and time.     Cranial Nerves: No cranial nerve deficit.     Coordination: Coordination normal.  Psychiatric:        Behavior: Behavior normal.        Thought Content: Thought content normal.     CMP Latest Ref Rng & Units 03/14/2019  Glucose 70 - 99 mg/dL 101(H)  BUN 6 - 20 mg/dL 21(H)  Creatinine 0.61 - 1.24 mg/dL 1.23  Sodium 135 - 145 mmol/L 136  Potassium  3.5 - 5.1 mmol/L 4.2  Chloride 98 - 111 mmol/L 103  CO2 22 - 32 mmol/L 26  Calcium 8.9 - 10.3 mg/dL 9.9  Total Protein 6.5 - 8.1 g/dL -  Total Bilirubin 0.3 - 1.2 mg/dL -  Alkaline Phos 38 - 126 U/L -  AST 15 - 41 U/L -  ALT 0 - 44 U/L -   CBC Latest Ref Rng & Units 03/07/2019  WBC 4.0 - 10.5 K/uL 9.7  Hemoglobin 13.0 - 17.0 g/dL 16.1  Hematocrit 39.0 - 52.0 % 46.8  Platelets 150 - 400 K/uL 294    No results found.   Assessment and plan 1. Follicular lymphoma of intra-abdominal lymph nodes, unspecified follicular lymphoma type ([place])   2. Other acute pulmonary embolism with acute cor pulmonale ([place])   3. Port-A-Cath in place   4. Encounter for antineoplastic chemotherapy    Stage III follicular lymphoma Labs are reviewed and discussed with patient. Counts acceptable to proceed with cycle 2 BR day 1 and day 2.   BUN slightly high, creatinine stable at 1.01.  Recommend continue oral hydration.  Continue Allopurinol for prophylaxis.   Acute pulmonary embolism,  On chronic anticoagulation with Eliquis 5 mg twice daily.  Tolerates well.  No bleeding events.  Port-A-Cath in place. Follow-up in 4 weeks for evaluation prior to cycle 3 BR.   Earlie Server, MD, PhD Hematology Oncology [facility] Pager- [phone] 04/01/2019

## 2019-04-02 ENCOUNTER — Inpatient Hospital Stay: Payer: BC Managed Care – PPO | Attending: Oncology

## 2019-04-02 ENCOUNTER — Other Ambulatory Visit: Payer: Self-pay

## 2019-04-02 VITALS — BP 128/85 | HR 76 | Temp 98.3°F | Resp 20

## 2019-04-02 DIAGNOSIS — Z5111 Encounter for antineoplastic chemotherapy: Secondary | ICD-10-CM | POA: Insufficient documentation

## 2019-04-02 DIAGNOSIS — C8293 Follicular lymphoma, unspecified, intra-abdominal lymph nodes: Secondary | ICD-10-CM | POA: Insufficient documentation

## 2019-04-02 DIAGNOSIS — C8280 Other types of follicular lymphoma, unspecified site: Secondary | ICD-10-CM

## 2019-04-02 MED ORDER — SODIUM CHLORIDE 0.9 % IV SOLN
90.0000 mg/m2 | Freq: Once | INTRAVENOUS | Status: AC
  Administered 2019-04-02: 200 mg via INTRAVENOUS
  Filled 2019-04-02: qty 8

## 2019-04-02 MED ORDER — SODIUM CHLORIDE 0.9 % IV SOLN
Freq: Once | INTRAVENOUS | Status: AC
  Administered 2019-04-02: 14:00:00 via INTRAVENOUS
  Filled 2019-04-02: qty 250

## 2019-04-02 MED ORDER — HEPARIN SOD (PORK) LOCK FLUSH 100 UNIT/ML IV SOLN
500.0000 [IU] | Freq: Once | INTRAVENOUS | Status: AC | PRN
  Administered 2019-04-02: 500 [IU]
  Filled 2019-04-02: qty 5

## 2019-04-02 MED ORDER — DEXAMETHASONE SODIUM PHOSPHATE 10 MG/ML IJ SOLN
10.0000 mg | Freq: Once | INTRAMUSCULAR | Status: AC
  Administered 2019-04-02: 10 mg via INTRAVENOUS
  Filled 2019-04-02: qty 1

## 2019-04-19 ENCOUNTER — Encounter: Payer: Self-pay | Admitting: Oncology

## 2019-04-22 ENCOUNTER — Encounter: Payer: Self-pay | Admitting: Dietician

## 2019-04-22 ENCOUNTER — Encounter: Payer: BC Managed Care – PPO | Attending: Internal Medicine | Admitting: Dietician

## 2019-04-22 ENCOUNTER — Other Ambulatory Visit: Payer: Self-pay

## 2019-04-22 VITALS — BP 114/88 | Ht 72.0 in | Wt 207.8 lb

## 2019-04-22 DIAGNOSIS — Z6829 Body mass index (BMI) 29.0-29.9, adult: Secondary | ICD-10-CM | POA: Insufficient documentation

## 2019-04-22 DIAGNOSIS — Z713 Dietary counseling and surveillance: Secondary | ICD-10-CM | POA: Insufficient documentation

## 2019-04-22 DIAGNOSIS — E1165 Type 2 diabetes mellitus with hyperglycemia: Secondary | ICD-10-CM | POA: Diagnosis not present

## 2019-04-22 DIAGNOSIS — E119 Type 2 diabetes mellitus without complications: Secondary | ICD-10-CM

## 2019-04-22 NOTE — Progress Notes (Signed)

## 2019-04-23 ENCOUNTER — Encounter: Payer: Self-pay | Admitting: *Deleted

## 2019-04-29 ENCOUNTER — Other Ambulatory Visit: Payer: Self-pay

## 2019-04-29 ENCOUNTER — Encounter: Payer: Self-pay | Admitting: Oncology

## 2019-04-29 ENCOUNTER — Inpatient Hospital Stay (HOSPITAL_BASED_OUTPATIENT_CLINIC_OR_DEPARTMENT_OTHER): Payer: BC Managed Care – PPO | Admitting: Oncology

## 2019-04-29 ENCOUNTER — Inpatient Hospital Stay: Payer: BC Managed Care – PPO

## 2019-04-29 VITALS — BP 120/83 | HR 91 | Temp 98.1°F | Resp 16 | Wt 206.8 lb

## 2019-04-29 DIAGNOSIS — C8280 Other types of follicular lymphoma, unspecified site: Secondary | ICD-10-CM

## 2019-04-29 DIAGNOSIS — Z5111 Encounter for antineoplastic chemotherapy: Secondary | ICD-10-CM | POA: Insufficient documentation

## 2019-04-29 DIAGNOSIS — C8293 Follicular lymphoma, unspecified, intra-abdominal lymph nodes: Secondary | ICD-10-CM | POA: Diagnosis not present

## 2019-04-29 DIAGNOSIS — Z86718 Personal history of other venous thrombosis and embolism: Secondary | ICD-10-CM

## 2019-04-29 DIAGNOSIS — Z86711 Personal history of pulmonary embolism: Secondary | ICD-10-CM | POA: Diagnosis not present

## 2019-04-29 DIAGNOSIS — I2699 Other pulmonary embolism without acute cor pulmonale: Secondary | ICD-10-CM | POA: Insufficient documentation

## 2019-04-29 LAB — CBC WITH DIFFERENTIAL/PLATELET
Abs Immature Granulocytes: 0.09 10*3/uL — ABNORMAL HIGH (ref 0.00–0.07)
Basophils Absolute: 0.1 10*3/uL (ref 0.0–0.1)
Basophils Relative: 1 %
Eosinophils Absolute: 0.1 10*3/uL (ref 0.0–0.5)
Eosinophils Relative: 1 %
HCT: 46.8 % (ref 39.0–52.0)
Hemoglobin: 15.9 g/dL (ref 13.0–17.0)
Immature Granulocytes: 1 %
Lymphocytes Relative: 15 %
Lymphs Abs: 1.4 10*3/uL (ref 0.7–4.0)
MCH: 29.4 pg (ref 26.0–34.0)
MCHC: 34 g/dL (ref 30.0–36.0)
MCV: 86.5 fL (ref 80.0–100.0)
Monocytes Absolute: 1.1 10*3/uL — ABNORMAL HIGH (ref 0.1–1.0)
Monocytes Relative: 11 %
Neutro Abs: 6.7 10*3/uL (ref 1.7–7.7)
Neutrophils Relative %: 71 %
Platelets: 232 10*3/uL (ref 150–400)
RBC: 5.41 MIL/uL (ref 4.22–5.81)
RDW: 13.2 % (ref 11.5–15.5)
WBC: 9.5 10*3/uL (ref 4.0–10.5)
nRBC: 0 % (ref 0.0–0.2)

## 2019-04-29 LAB — COMPREHENSIVE METABOLIC PANEL
ALT: 29 U/L (ref 0–44)
AST: 22 U/L (ref 15–41)
Albumin: 4.3 g/dL (ref 3.5–5.0)
Alkaline Phosphatase: 63 U/L (ref 38–126)
Anion gap: 8 (ref 5–15)
BUN: 18 mg/dL (ref 6–20)
CO2: 25 mmol/L (ref 22–32)
Calcium: 9.6 mg/dL (ref 8.9–10.3)
Chloride: 104 mmol/L (ref 98–111)
Creatinine, Ser: 1.04 mg/dL (ref 0.61–1.24)
GFR calc Af Amer: >60 mL/min (ref >60)
GFR calc non Af Amer: >60 mL/min (ref >60)
Glucose, Bld: 105 mg/dL — ABNORMAL HIGH (ref 70–99)
Potassium: 4.3 mmol/L (ref 3.5–5.1)
Sodium: 137 mmol/L (ref 135–145)
Total Bilirubin: 0.5 mg/dL (ref 0.3–1.2)
Total Protein: 7.6 g/dL (ref 6.5–8.1)

## 2019-04-29 MED ORDER — HEPARIN SOD (PORK) LOCK FLUSH 100 UNIT/ML IV SOLN
INTRAVENOUS | Status: AC
  Filled 2019-04-29: qty 5

## 2019-04-29 MED ORDER — ACETAMINOPHEN 325 MG PO TABS
650.0000 mg | ORAL_TABLET | Freq: Once | ORAL | Status: AC
  Administered 2019-04-29: 650 mg via ORAL
  Filled 2019-04-29: qty 2

## 2019-04-29 MED ORDER — SODIUM CHLORIDE 0.9 % IV SOLN
90.0000 mg/m2 | Freq: Once | INTRAVENOUS | Status: AC
  Administered 2019-04-29: 200 mg via INTRAVENOUS
  Filled 2019-04-29: qty 8

## 2019-04-29 MED ORDER — SODIUM CHLORIDE 0.9 % IV SOLN: 375.0000 mg/m2 | Freq: Once | INTRAVENOUS | Status: DC

## 2019-04-29 MED ORDER — SODIUM CHLORIDE 0.9 % IV SOLN
375.0000 mg/m2 | Freq: Once | INTRAVENOUS | Status: AC
  Administered 2019-04-29: 800 mg via INTRAVENOUS
  Filled 2019-04-29: qty 50

## 2019-04-29 MED ORDER — PALONOSETRON HCL INJECTION 0.25 MG/5ML
0.2500 mg | Freq: Once | INTRAVENOUS | Status: AC
  Administered 2019-04-29: 0.25 mg via INTRAVENOUS

## 2019-04-29 MED ORDER — HEPARIN SOD (PORK) LOCK FLUSH 100 UNIT/ML IV SOLN
500.0000 [IU] | Freq: Once | INTRAVENOUS | Status: AC | PRN
  Administered 2019-04-29: 500 [IU]
  Filled 2019-04-29: qty 5

## 2019-04-29 MED ORDER — SODIUM CHLORIDE 0.9 % IV SOLN
Freq: Once | INTRAVENOUS | Status: AC
  Filled 2019-04-29: qty 250

## 2019-04-29 MED ORDER — DIPHENHYDRAMINE HCL 25 MG PO CAPS
50.0000 mg | ORAL_CAPSULE | Freq: Once | ORAL | Status: AC
  Administered 2019-04-29: 50 mg via ORAL
  Filled 2019-04-29: qty 2

## 2019-04-29 MED ORDER — DEXAMETHASONE SODIUM PHOSPHATE 10 MG/ML IJ SOLN
10.0000 mg | Freq: Once | INTRAMUSCULAR | Status: AC
  Administered 2019-04-29: 10 mg via INTRAVENOUS
  Filled 2019-04-29: qty 1

## 2019-04-29 NOTE — Progress Notes (Signed)
Per MD ok to change patient to rapid Ruxience.  Cycle 1 and 2 ran slow with no problems.

## 2019-04-29 NOTE — Progress Notes (Signed)
Hematology/Oncology Follow Up Note [facility]  [phone] [phone]  Patient Care Team: Rusty Aus, MD as PCP - General (Internal Medicine)   Name of the patient: Edward Atkinson  [id number]  12/20/1960   REASON FOR VISIT  follow-up for management of pulmonary embolism, follicular cell lymphoma.  PERTINENT ONCOLOGY HISTORY # 01/31/2019 Unprovoked PE,  CT chest PE protocol reviewed bilateral massive pulmonary embolism with right heart strain and patient was started on heparin drip.  Patient was tachypneic and tachycardia.  Vascular surgeon was consulted and the patient was taken to the OR for thrombectomy and thrombolysis.  Patient also had selective catheter placement right upper lobe, middle lobe, lower lobe pulmonary arteries, left upper and lower pulmonary arteries. Ultrasound venous duplex bilateral showed DVT of the left lower extremity with nonocclusive thrombus in the left common femoral vein.  Some of the common femoral venous thrombus does extend just into the GSV across SF J.  No evidence of right lower extremity DVT. Patient was started on heparin drip for anticoagulation. Patient is status post embolectomy by vascular surgery. #Additional CT pancreas was done during that admission which showed peripancreatic/retroperitoneal and contiguous small bowel mesentery nodal mass, retroperitoneal adenopathy, gastrohepatic ligament lymphadenopathy, left abdominal omental nodule CT-guided omentum biopsy was done during hospital.  Patient was switched to Lovenox at discharge.  #  PET scan done on 02/11/2019 which showed large conglomerate retroperitoneal and mesenteric mass with maximal SUV 10.6 compatible with Deauville 5 disease.  Deauville 4 adenopathy in the pelvis. A left axillary lymph node measuring 0.7 cm in short axis has a maximum SUV of 3.3. Mild splenomegaly without focal splenic lesion identified. #02/21/2019, bone marrow biopsy  showed no lymphoma involvement.  There might be lymphocyte aggregates that were being cut away on the deeper levels.  Additional IHC did not show residual echograms.  INTERVAL HISTORY 58 y.o. male with newly diagnosed pulmonary embolism and follicular lymphoma present for follow-up-evaluation prior to cycle 3 BR  Patient reports feeling well. Appetite is good.  Weight is stable.  Denies any pain today. He reports that he felt tired, usually active third or fourth day off each chemotherapy cycle. He has no new complaints today. Patient has been on Eliquis 5 mg twice daily.  Denies any shortness of breath or left lower extremity swelling.   Review of Systems  Constitutional: Negative for appetite change, chills, fatigue, fever and unexpected weight change.  HENT:   Negative for hearing loss and voice change.   Eyes: Negative for eye problems and icterus.  Respiratory: Negative for chest tightness, cough and shortness of breath.   Cardiovascular: Negative for chest pain and leg swelling.  Gastrointestinal: Negative for abdominal distention and abdominal pain.  Endocrine: Negative for hot flashes.  Genitourinary: Negative for difficulty urinating, dysuria and frequency.   Musculoskeletal: Negative for arthralgias.  Skin: Negative for itching and rash.  Neurological: Negative for light-headedness and numbness.  Hematological: Negative for adenopathy. Does not bruise/bleed easily.  Psychiatric/Behavioral: Negative for confusion.      Allergies  Allergen Reactions  . Penicillins Rash     Past Medical History:  Diagnosis Date  . Diabetes mellitus without complication ([place])   . Diverticulitis   . Follicular lymphoma of intra-abdominal lymph nodes ([place]) 02/15/2019     Past Surgical History:  Procedure Laterality Date  . CHOLECYSTECTOMY    . COLON RESECTION    . PORTA CATH INSERTION N/A 03/11/2019   Procedure: PORTA CATH INSERTION;  Surgeon: Algernon Huxley, MD;  Location: [place] [facility];  Service: Cardiovascular;  Laterality: N/A;  . PULMONARY THROMBECTOMY N/A 01/29/2019   Procedure: PULMONARY THROMBECTOMY;  Surgeon: Algernon Huxley, MD;  Location: [place] [facility];  Service: Cardiovascular;  Laterality: N/A;    Social History   Socioeconomic History  . Marital status: Married    Spouse name: Not on file  . Number of children: Not on file  . Years of education: Not on file  . Highest education level: Not on file  Occupational History  . Not on file  Tobacco Use  . Smoking status: Never Smoker  . Smokeless tobacco: Never Used  Substance and Sexual Activity  . Alcohol use: Yes    Alcohol/week: 0.0 - 2.0 standard drinks    Comment: occasional  . Drug use: Never  . Sexual activity: Yes  Other Topics Concern  . Not on file  Social History Narrative  . Not on file   Social Determinants of Health   Financial Resource Strain:   . Difficulty of Paying Living Expenses: Not on file  Food Insecurity:   . Worried About Charity fundraiser in the Last Year: Not on file  . Ran Out of Food in the Last Year: Not on file  Transportation Needs:   . Lack of Transportation (Medical): Not on file  . Lack of Transportation (Non-Medical): Not on file  Physical Activity:   . Days of Exercise per Week: Not on file  . Minutes of Exercise per Session: Not on file  Stress:   . Feeling of Stress : Not on file  Social Connections:   . Frequency of Communication with Friends and Family: Not on file  . Frequency of Social Gatherings with Friends and Family: Not on file  . Attends Religious Services: Not on file  . Active Member of Clubs or Organizations: Not on file  . Attends Archivist Meetings: Not on file  . Marital Status: Not on file  Intimate Partner Violence:   . Fear of Current or Ex-Partner: Not on file  . Emotionally Abused: Not on file  . Physically Abused: Not on file  . Sexually Abused: Not on file    Family History  Problem Relation  Age of Onset  . Diabetes Brother      Current Outpatient Medications:  .  allopurinol (ZYLOPRIM) 300 MG tablet, Take 1 tablet (300 mg total) by mouth daily., Disp: 30 tablet, Rfl: 3 .  apixaban (ELIQUIS) 5 MG TABS tablet, Take 1 tablet (5 mg total) by mouth 2 (two) times daily., Disp: 60 tablet, Rfl: 11 .  glimepiride (AMARYL) 4 MG tablet, Take 4 mg by mouth daily with breakfast., Disp: , Rfl:  .  lidocaine-prilocaine (EMLA) cream, Apply 1 application topically as needed., Disp: 30 g, Rfl: 0 .  prochlorperazine (COMPAZINE) 10 MG tablet, Take 1 tablet (10 mg total) by mouth every 6 (six) hours as needed (Nausea or vomiting). (Patient not taking: Reported on 04/29/2019), Disp: 30 tablet, Rfl: 1 No current facility-administered medications for this visit.  Facility-Administered Medications Ordered in Other Visits:  .  0.9 %  sodium chloride infusion, , Intravenous, Once, Earlie Server, MD .  acetaminophen (TYLENOL) tablet 650 mg, 650 mg, Oral, Once, Earlie Server, MD .  [facility]) 200 mg in sodium chloride 0.9 % 50 mL (3.4483 mg/mL) chemo infusion, 90 mg/m2 (Treatment Plan Recorded), Intravenous, Once, Earlie Server, MD .  dexamethasone (DECADRON) injection 10 mg, 10  mg, Intravenous, Once, Earlie Server, MD .  diphenhydrAMINE (BENADRYL) capsule 50 mg, 50 mg, Oral, Once, Earlie Server, MD .  heparin lock flush 100 unit/mL, 500 Units, Intracatheter, Once PRN, Earlie Server, MD .  palonosetron (ALOXI) injection 0.25 mg, 0.25 mg, Intravenous, Once, Earlie Server, MD .  riTUXimab-pvvr (RUXIENCE) 800 mg in sodium chloride 0.9 % 250 mL (2.4242 mg/mL) infusion, 375 mg/m2 (Treatment Plan Recorded), Intravenous, Once, Earlie Server, MD  Physical exam:  Vitals:   04/29/19 0833  BP: 120/83  Pulse: 91  Resp: 16  Temp: 98.1 F (36.7 C)  Weight: 206 lb 12.8 oz (93.8 kg)   Physical Exam Constitutional:      General: He is not in acute distress. HENT:     Head: Normocephalic and atraumatic.  Eyes:     General: No scleral  icterus.    Pupils: Pupils are equal, round, and reactive to light.  Cardiovascular:     Rate and Rhythm: Normal rate and regular rhythm.     Heart sounds: Normal heart sounds.  Pulmonary:     Effort: Pulmonary effort is normal. No respiratory distress.     Breath sounds: No wheezing.  Abdominal:     General: Bowel sounds are normal. There is no distension.     Palpations: Abdomen is soft. There is no mass.     Tenderness: There is no abdominal tenderness.  Musculoskeletal:        General: No deformity. Normal range of motion.     Cervical back: Normal range of motion and neck supple.  Skin:    General: Skin is warm and dry.     Findings: No erythema or rash.  Neurological:     Mental Status: He is alert and oriented to person, place, and time.     Cranial Nerves: No cranial nerve deficit.     Coordination: Coordination normal.  Psychiatric:        Behavior: Behavior normal.        Thought Content: Thought content normal.     CMP Latest Ref Rng & Units 04/29/2019  Glucose 70 - 99 mg/dL 105(H)  BUN 6 - 20 mg/dL 18  Creatinine 0.61 - 1.24 mg/dL 1.04  Sodium 135 - 145 mmol/L 137  Potassium 3.5 - 5.1 mmol/L 4.3  Chloride 98 - 111 mmol/L 104  CO2 22 - 32 mmol/L 25  Calcium 8.9 - 10.3 mg/dL 9.6  Total Protein 6.5 - 8.1 g/dL 7.6  Total Bilirubin 0.3 - 1.2 mg/dL 0.5  Alkaline Phos 38 - 126 U/L 63  AST 15 - 41 U/L 22  ALT 0 - 44 U/L 29   CBC Latest Ref Rng & Units 04/29/2019  WBC 4.0 - 10.5 K/uL 9.5  Hemoglobin 13.0 - 17.0 g/dL 15.9  Hematocrit 39.0 - 52.0 % 46.8  Platelets 150 - 400 K/uL 232    No results found.   Assessment and plan 1. Follicular lymphoma of intra-abdominal lymph nodes, unspecified follicular lymphoma type ([place])   2. Encounter for antineoplastic chemotherapy   3. History of pulmonary embolism   4. History of femoral artery thrombosis    Stage III follicular lymphoma Labs are reviewed and discussed with patient. Counts acceptable to proceed with  cycle 3 BR day 1 to day 2.  History of acute [place] embolism and left femoral thrombosis in October 2020 Patient has been on chronic anticoagulation with Eliquis 5 mg twice daily He tolerates well. Hemoglobin stable. Continue current regimen.  Acute pulmonary embolism,  On  chronic anticoagulation with Eliquis 5 mg twice daily.  Tolerates well.  No bleeding events.  Port-A-Cath in place, no signs of infection. Follow-up in 4 weeks for evaluation prior to cycle 4 BR.   Earlie Server, MD, PhD Hematology Oncology [facility] Pager- [phone] 04/29/2019

## 2019-04-29 NOTE — Progress Notes (Signed)
Patient does not offer any problems today.  

## 2019-04-30 ENCOUNTER — Other Ambulatory Visit: Payer: Self-pay

## 2019-04-30 ENCOUNTER — Inpatient Hospital Stay: Payer: BC Managed Care – PPO

## 2019-04-30 VITALS — BP 109/72 | HR 90 | Resp 18

## 2019-04-30 DIAGNOSIS — C8280 Other types of follicular lymphoma, unspecified site: Secondary | ICD-10-CM

## 2019-04-30 DIAGNOSIS — Z5111 Encounter for antineoplastic chemotherapy: Secondary | ICD-10-CM | POA: Diagnosis not present

## 2019-04-30 DIAGNOSIS — C8293 Follicular lymphoma, unspecified, intra-abdominal lymph nodes: Secondary | ICD-10-CM

## 2019-04-30 MED ORDER — HEPARIN SOD (PORK) LOCK FLUSH 100 UNIT/ML IV SOLN
INTRAVENOUS | Status: AC
  Filled 2019-04-30: qty 5

## 2019-04-30 MED ORDER — DEXAMETHASONE SODIUM PHOSPHATE 10 MG/ML IJ SOLN
10.0000 mg | Freq: Once | INTRAMUSCULAR | Status: AC
  Administered 2019-04-30: 10 mg via INTRAVENOUS
  Filled 2019-04-30: qty 1

## 2019-04-30 MED ORDER — HEPARIN SOD (PORK) LOCK FLUSH 100 UNIT/ML IV SOLN
500.0000 [IU] | Freq: Once | INTRAVENOUS | Status: AC | PRN
  Administered 2019-04-30: 500 [IU]
  Filled 2019-04-30: qty 5

## 2019-04-30 MED ORDER — SODIUM CHLORIDE 0.9 % IV SOLN
Freq: Once | INTRAVENOUS | Status: AC
  Filled 2019-04-30: qty 250

## 2019-04-30 MED ORDER — SODIUM CHLORIDE 0.9 % IV SOLN
90.0000 mg/m2 | Freq: Once | INTRAVENOUS | Status: AC
  Administered 2019-04-30: 200 mg via INTRAVENOUS
  Filled 2019-04-30: qty 8

## 2019-05-11 ENCOUNTER — Other Ambulatory Visit: Payer: Self-pay | Admitting: Oncology

## 2019-05-11 DIAGNOSIS — C8293 Follicular lymphoma, unspecified, intra-abdominal lymph nodes: Secondary | ICD-10-CM

## 2019-05-11 DIAGNOSIS — C8280 Other types of follicular lymphoma, unspecified site: Secondary | ICD-10-CM

## 2019-05-11 NOTE — Telephone Encounter (Signed)
Does he have any appt with me? I only see infusion appts.  Please let him know that he can finish his currently supply of Allopurinol. Then stop.

## 2019-05-13 NOTE — Telephone Encounter (Signed)
Pt informed

## 2019-05-13 NOTE — Telephone Encounter (Signed)
Getting his schedule fixed were he will have labs/MD/Tx at next visit.

## 2019-05-27 ENCOUNTER — Other Ambulatory Visit: Payer: Self-pay

## 2019-05-27 ENCOUNTER — Inpatient Hospital Stay: Payer: BC Managed Care – PPO

## 2019-05-27 ENCOUNTER — Encounter: Payer: Self-pay | Admitting: Oncology

## 2019-05-27 ENCOUNTER — Inpatient Hospital Stay: Payer: BC Managed Care – PPO | Attending: Oncology

## 2019-05-27 ENCOUNTER — Inpatient Hospital Stay (HOSPITAL_BASED_OUTPATIENT_CLINIC_OR_DEPARTMENT_OTHER): Payer: BC Managed Care – PPO | Admitting: Oncology

## 2019-05-27 VITALS — BP 133/85 | HR 85 | Temp 96.0°F | Resp 18 | Wt 208.1 lb

## 2019-05-27 VITALS — BP 121/71 | HR 78 | Temp 95.9°F | Resp 18

## 2019-05-27 DIAGNOSIS — Z5111 Encounter for antineoplastic chemotherapy: Secondary | ICD-10-CM | POA: Insufficient documentation

## 2019-05-27 DIAGNOSIS — Z86711 Personal history of pulmonary embolism: Secondary | ICD-10-CM

## 2019-05-27 DIAGNOSIS — E119 Type 2 diabetes mellitus without complications: Secondary | ICD-10-CM | POA: Diagnosis not present

## 2019-05-27 DIAGNOSIS — Z79899 Other long term (current) drug therapy: Secondary | ICD-10-CM | POA: Insufficient documentation

## 2019-05-27 DIAGNOSIS — R5383 Other fatigue: Secondary | ICD-10-CM

## 2019-05-27 DIAGNOSIS — C8293 Follicular lymphoma, unspecified, intra-abdominal lymph nodes: Secondary | ICD-10-CM | POA: Diagnosis not present

## 2019-05-27 DIAGNOSIS — Z7901 Long term (current) use of anticoagulants: Secondary | ICD-10-CM | POA: Insufficient documentation

## 2019-05-27 DIAGNOSIS — C8280 Other types of follicular lymphoma, unspecified site: Secondary | ICD-10-CM

## 2019-05-27 DIAGNOSIS — C8223 Follicular lymphoma grade III, unspecified, intra-abdominal lymph nodes: Secondary | ICD-10-CM | POA: Insufficient documentation

## 2019-05-27 DIAGNOSIS — Z86718 Personal history of other venous thrombosis and embolism: Secondary | ICD-10-CM | POA: Diagnosis not present

## 2019-05-27 DIAGNOSIS — Z5112 Encounter for antineoplastic immunotherapy: Secondary | ICD-10-CM | POA: Diagnosis present

## 2019-05-27 LAB — TSH: TSH: 1.978 u[IU]/mL (ref 0.350–4.500)

## 2019-05-27 LAB — COMPREHENSIVE METABOLIC PANEL
ALT: 26 U/L (ref 0–44)
AST: 23 U/L (ref 15–41)
Albumin: 3.9 g/dL (ref 3.5–5.0)
Alkaline Phosphatase: 65 U/L (ref 38–126)
Anion gap: 8 (ref 5–15)
BUN: 15 mg/dL (ref 6–20)
CO2: 25 mmol/L (ref 22–32)
Calcium: 9.1 mg/dL (ref 8.9–10.3)
Chloride: 104 mmol/L (ref 98–111)
Creatinine, Ser: 1.05 mg/dL (ref 0.61–1.24)
GFR calc Af Amer: >60 mL/min (ref >60)
GFR calc non Af Amer: >60 mL/min (ref >60)
Glucose, Bld: 113 mg/dL — ABNORMAL HIGH (ref 70–99)
Potassium: 4.1 mmol/L (ref 3.5–5.1)
Sodium: 137 mmol/L (ref 135–145)
Total Bilirubin: 0.7 mg/dL (ref 0.3–1.2)
Total Protein: 6.7 g/dL (ref 6.5–8.1)

## 2019-05-27 LAB — CBC WITH DIFFERENTIAL/PLATELET
Abs Immature Granulocytes: 0.04 10*3/uL (ref 0.00–0.07)
Basophils Absolute: 0.1 10*3/uL (ref 0.0–0.1)
Basophils Relative: 1 %
Eosinophils Absolute: 0.1 10*3/uL (ref 0.0–0.5)
Eosinophils Relative: 2 %
HCT: 43.3 % (ref 39.0–52.0)
Hemoglobin: 14.5 g/dL (ref 13.0–17.0)
Immature Granulocytes: 1 %
Lymphocytes Relative: 26 %
Lymphs Abs: 1.6 10*3/uL (ref 0.7–4.0)
MCH: 30 pg (ref 26.0–34.0)
MCHC: 33.5 g/dL (ref 30.0–36.0)
MCV: 89.5 fL (ref 80.0–100.0)
Monocytes Absolute: 0.7 10*3/uL (ref 0.1–1.0)
Monocytes Relative: 12 %
Neutro Abs: 3.7 10*3/uL (ref 1.7–7.7)
Neutrophils Relative %: 58 %
Platelets: 187 10*3/uL (ref 150–400)
RBC: 4.84 MIL/uL (ref 4.22–5.81)
RDW: 13.5 % (ref 11.5–15.5)
WBC: 6.3 10*3/uL (ref 4.0–10.5)
nRBC: 0 % (ref 0.0–0.2)

## 2019-05-27 MED ORDER — DEXAMETHASONE SODIUM PHOSPHATE 10 MG/ML IJ SOLN
10.0000 mg | Freq: Once | INTRAMUSCULAR | Status: AC
  Administered 2019-05-27: 09:00:00 10 mg via INTRAVENOUS
  Filled 2019-05-27: qty 1

## 2019-05-27 MED ORDER — SODIUM CHLORIDE 0.9% FLUSH
10.0000 mL | Freq: Once | INTRAVENOUS | Status: AC
  Administered 2019-05-27: 08:00:00 10 mL via INTRAVENOUS
  Filled 2019-05-27: qty 10

## 2019-05-27 MED ORDER — PALONOSETRON HCL INJECTION 0.25 MG/5ML
0.2500 mg | Freq: Once | INTRAVENOUS | Status: AC
  Administered 2019-05-27: 0.25 mg via INTRAVENOUS

## 2019-05-27 MED ORDER — SODIUM CHLORIDE 0.9 % IV SOLN
Freq: Once | INTRAVENOUS | Status: AC
  Filled 2019-05-27: qty 250

## 2019-05-27 MED ORDER — ACETAMINOPHEN 325 MG PO TABS
650.0000 mg | ORAL_TABLET | Freq: Once | ORAL | Status: AC
  Administered 2019-05-27: 09:00:00 650 mg via ORAL
  Filled 2019-05-27: qty 2

## 2019-05-27 MED ORDER — SODIUM CHLORIDE 0.9 % IV SOLN
90.0000 mg/m2 | Freq: Once | INTRAVENOUS | Status: AC
  Administered 2019-05-27: 200 mg via INTRAVENOUS
  Filled 2019-05-27: qty 8

## 2019-05-27 MED ORDER — HEPARIN SOD (PORK) LOCK FLUSH 100 UNIT/ML IV SOLN
500.0000 [IU] | Freq: Once | INTRAVENOUS | Status: AC | PRN
  Administered 2019-05-27: 12:00:00 500 [IU]
  Filled 2019-05-27: qty 5

## 2019-05-27 MED ORDER — DIPHENHYDRAMINE HCL 25 MG PO CAPS
50.0000 mg | ORAL_CAPSULE | Freq: Once | ORAL | Status: AC
  Administered 2019-05-27: 09:00:00 50 mg via ORAL
  Filled 2019-05-27: qty 2

## 2019-05-27 MED ORDER — SODIUM CHLORIDE 0.9 % IV SOLN
375.0000 mg/m2 | Freq: Once | INTRAVENOUS | Status: AC
  Administered 2019-05-27: 10:00:00 800 mg via INTRAVENOUS
  Filled 2019-05-27: qty 50

## 2019-05-27 NOTE — Progress Notes (Signed)
Hematology/Oncology Follow Up Note [facility]  Telephone:([phone] Fax:([phone]  Patient Care Team: Rusty Aus, MD as PCP - General (Internal Medicine)   Name of the patient: Edward Atkinson  [id number]  1960-11-30   REASON FOR VISIT  follow-up for management of pulmonary embolism, follicular cell lymphoma.  PERTINENT ONCOLOGY HISTORY # 01/31/2019 Unprovoked PE,  CT chest PE protocol reviewed bilateral massive pulmonary embolism with right heart strain and patient was started on heparin drip.  Patient was tachypneic and tachycardia.  Vascular surgeon was consulted and the patient was taken to the OR for thrombectomy and thrombolysis.  Patient also had selective catheter placement right upper lobe, middle lobe, lower lobe pulmonary arteries, left upper and lower pulmonary arteries. Ultrasound venous duplex bilateral showed DVT of the left lower extremity with nonocclusive thrombus in the left common femoral vein.  Some of the common femoral venous thrombus does extend just into the GSV across SF J.  No evidence of right lower extremity DVT. Patient was started on heparin drip for anticoagulation. Patient is status post embolectomy by vascular surgery. #Additional CT pancreas was done during that admission which showed peripancreatic/retroperitoneal and contiguous small bowel mesentery nodal mass, retroperitoneal adenopathy, gastrohepatic ligament lymphadenopathy, left abdominal omental nodule CT-guided omentum biopsy was done during hospital.  Patient was switched to Lovenox at discharge.  #  PET scan done on 02/11/2019 which showed large conglomerate retroperitoneal and mesenteric mass with maximal SUV 10.6 compatible with Deauville 5 disease.  Deauville 4 adenopathy in the pelvis. A left axillary lymph node measuring 0.7 cm in short axis has a maximum SUV of 3.3. Mild splenomegaly without focal splenic lesion identified. #02/21/2019, bone marrow biopsy  showed no lymphoma involvement.  There might be lymphocyte aggregates that were being cut away on the deeper levels.  Additional IHC did not show residual echograms.  INTERVAL HISTORY 59 y.o. male with newly diagnosed pulmonary embolism and follicular lymphoma present for follow-up-evaluation prior to cycle 4 BR Patient reports feeling well.  Increased fatigue, especially after each chemotherapy treatments. Appetite is good. Has been stable. He also mentioned that his weight has been busier recently. No new complaints. He has been on Eliquis 5 mg twice daily.  Denies any bleeding events.  Breathing is good.   Review of Systems  Constitutional: Positive for fatigue. Negative for appetite change, chills, fever and unexpected weight change.  HENT:   Negative for hearing loss and voice change.   Eyes: Negative for eye problems and icterus.  Respiratory: Negative for chest tightness, cough and shortness of breath.   Cardiovascular: Negative for chest pain and leg swelling.  Gastrointestinal: Negative for abdominal distention and abdominal pain.  Endocrine: Negative for hot flashes.  Genitourinary: Negative for difficulty urinating, dysuria and frequency.   Musculoskeletal: Negative for arthralgias.  Skin: Negative for itching and rash.  Neurological: Negative for light-headedness and numbness.  Hematological: Negative for adenopathy. Does not bruise/bleed easily.  Psychiatric/Behavioral: Negative for confusion.      Allergies  Allergen Reactions  . Penicillins Rash     Past Medical History:  Diagnosis Date  . Diabetes mellitus without complication ([place])   . Diverticulitis   . Follicular lymphoma of intra-abdominal lymph nodes ([place]) 02/15/2019     Past Surgical History:  Procedure Laterality Date  . CHOLECYSTECTOMY    . COLON RESECTION    . PORTA CATH INSERTION N/A 03/11/2019   Procedure: PORTA CATH INSERTION;  Surgeon: Algernon Huxley, MD;  Location: [facility]  INVASIVE CV LAB;   Service: Cardiovascular;  Laterality: N/A;  . PULMONARY THROMBECTOMY N/A 01/29/2019   Procedure: PULMONARY THROMBECTOMY;  Surgeon: Algernon Huxley, MD;  Location: [place] CV LAB;  Service: Cardiovascular;  Laterality: N/A;    Social History   Socioeconomic History  . Marital status: Married    Spouse name: Not on file  . Number of children: Not on file  . Years of education: Not on file  . Highest education level: Not on file  Occupational History  . Not on file  Tobacco Use  . Smoking status: Never Smoker  . Smokeless tobacco: Never Used  Substance and Sexual Activity  . Alcohol use: Yes    Alcohol/week: 0.0 - 2.0 standard drinks    Comment: occasional  . Drug use: Never  . Sexual activity: Yes  Other Topics Concern  . Not on file  Social History Narrative  . Not on file   Social Determinants of Health   Financial Resource Strain:   . Difficulty of Paying Living Expenses: Not on file  Food Insecurity:   . Worried About Charity fundraiser in the Last Year: Not on file  . Ran Out of Food in the Last Year: Not on file  Transportation Needs:   . Lack of Transportation (Medical): Not on file  . Lack of Transportation (Non-Medical): Not on file  Physical Activity:   . Days of Exercise per Week: Not on file  . Minutes of Exercise per Session: Not on file  Stress:   . Feeling of Stress : Not on file  Social Connections:   . Frequency of Communication with Friends and Family: Not on file  . Frequency of Social Gatherings with Friends and Family: Not on file  . Attends Religious Services: Not on file  . Active Member of Clubs or Organizations: Not on file  . Attends Archivist Meetings: Not on file  . Marital Status: Not on file  Intimate Partner Violence:   . Fear of Current or Ex-Partner: Not on file  . Emotionally Abused: Not on file  . Physically Abused: Not on file  . Sexually Abused: Not on file    Family History  Problem Relation Age of Onset  .  Diabetes Brother      Current Outpatient Medications:  .  allopurinol (ZYLOPRIM) 300 MG tablet, Take 1 tablet (300 mg total) by mouth daily., Disp: 30 tablet, Rfl: 3 .  apixaban (ELIQUIS) 5 MG TABS tablet, Take 1 tablet (5 mg total) by mouth 2 (two) times daily., Disp: 60 tablet, Rfl: 11 .  glimepiride (AMARYL) 4 MG tablet, Take 4 mg by mouth daily with breakfast., Disp: , Rfl:  .  lidocaine-prilocaine (EMLA) cream, Apply 1 application topically as needed., Disp: 30 g, Rfl: 0 .  prochlorperazine (COMPAZINE) 10 MG tablet, Take 1 tablet (10 mg total) by mouth every 6 (six) hours as needed (Nausea or vomiting). (Patient not taking: Reported on 04/29/2019), Disp: 30 tablet, Rfl: 1  Physical exam:  Vitals:   05/27/19 0834  BP: 133/85  Pulse: 85  Resp: 18  Temp: (!) 96 F (35.6 C)  TempSrc: Tympanic  Weight: 208 lb 1.6 oz (94.4 kg)   Physical Exam Constitutional:      General: He is not in acute distress. HENT:     Head: Normocephalic and atraumatic.  Eyes:     General: No scleral icterus.    Pupils: Pupils are equal, round, and reactive to light.  Cardiovascular:  Rate and Rhythm: Normal rate and regular rhythm.     Heart sounds: Normal heart sounds.  Pulmonary:     Effort: Pulmonary effort is normal. No respiratory distress.     Breath sounds: No wheezing.  Abdominal:     General: Bowel sounds are normal. There is no distension.     Palpations: Abdomen is soft. There is no mass.     Tenderness: There is no abdominal tenderness.  Musculoskeletal:        General: No deformity. Normal range of motion.     Cervical back: Normal range of motion and neck supple.  Skin:    General: Skin is warm and dry.     Findings: No erythema or rash.  Neurological:     Mental Status: He is alert and oriented to person, place, and time.     Cranial Nerves: No cranial nerve deficit.     Coordination: Coordination normal.  Psychiatric:        Behavior: Behavior normal.        Thought  Content: Thought content normal.     CMP Latest Ref Rng & Units 04/29/2019  Glucose 70 - 99 mg/dL 105(H)  BUN 6 - 20 mg/dL 18  Creatinine 0.61 - 1.24 mg/dL 1.04  Sodium 135 - 145 mmol/L 137  Potassium 3.5 - 5.1 mmol/L 4.3  Chloride 98 - 111 mmol/L 104  CO2 22 - 32 mmol/L 25  Calcium 8.9 - 10.3 mg/dL 9.6  Total Protein 6.5 - 8.1 g/dL 7.6  Total Bilirubin 0.3 - 1.2 mg/dL 0.5  Alkaline Phos 38 - 126 U/L 63  AST 15 - 41 U/L 22  ALT 0 - 44 U/L 29   CBC Latest Ref Rng & Units 04/29/2019  WBC 4.0 - 10.5 K/uL 9.5  Hemoglobin 13.0 - 17.0 g/dL 15.9  Hematocrit 39.0 - 52.0 % 46.8  Platelets 150 - 400 K/uL 232    No results found.   Assessment and plan 1. Follicular lymphoma of intra-abdominal lymph nodes, unspecified follicular lymphoma type ([place])   2. Encounter for antineoplastic chemotherapy   3. History of pulmonary embolism   4. Fatigue, unspecified type    Stage III follicular lymphoma Labs are reviewed and discussed with patient. Clinically he is doing well.  Overall tolerates bendamustine and rituximab. Counts are acceptable to proceed with cycle 4 BR day 1 and day 2.  #History of acute pulmonary embolism and left femoral thrombosis in October 2020. Continue anticoagulation with 5 mg twice daily. In the future, will consider decrease Eliquis to 2.5 mg twice daily after he finishes current chemotherapy and if PET scan shows improvement.  #Fatigue, check TSH.  Possible secondary to chemotherapy.  Continue to monitor. Port-A-Cath in place, no signs of infection. Follow-up in 4 weeks for evaluation prior to cycle 5 BR.   Earlie Server, MD, PhD Hematology Oncology [facility] Pager- [phone] 05/27/2019

## 2019-05-27 NOTE — Progress Notes (Signed)
Pt tolerated infusion well. Pt stable at discharge. 

## 2019-05-27 NOTE — Progress Notes (Signed)
Patient here for follow up. Patient reports increased tiredness after having last infusion.

## 2019-05-28 ENCOUNTER — Inpatient Hospital Stay: Payer: BC Managed Care – PPO

## 2019-05-28 VITALS — BP 146/86 | HR 83 | Temp 96.7°F | Resp 18

## 2019-05-28 DIAGNOSIS — Z5111 Encounter for antineoplastic chemotherapy: Secondary | ICD-10-CM | POA: Diagnosis not present

## 2019-05-28 DIAGNOSIS — C8293 Follicular lymphoma, unspecified, intra-abdominal lymph nodes: Secondary | ICD-10-CM

## 2019-05-28 DIAGNOSIS — C8280 Other types of follicular lymphoma, unspecified site: Secondary | ICD-10-CM

## 2019-05-28 MED ORDER — SODIUM CHLORIDE 0.9 % IV SOLN
Freq: Once | INTRAVENOUS | Status: AC
  Filled 2019-05-28: qty 250

## 2019-05-28 MED ORDER — HEPARIN SOD (PORK) LOCK FLUSH 100 UNIT/ML IV SOLN
500.0000 [IU] | Freq: Once | INTRAVENOUS | Status: AC | PRN
  Administered 2019-05-28: 14:00:00 500 [IU]
  Filled 2019-05-28: qty 5

## 2019-05-28 MED ORDER — HEPARIN SOD (PORK) LOCK FLUSH 100 UNIT/ML IV SOLN
INTRAVENOUS | Status: AC
  Filled 2019-05-28: qty 5

## 2019-05-28 MED ORDER — SODIUM CHLORIDE 0.9 % IV SOLN
90.0000 mg/m2 | Freq: Once | INTRAVENOUS | Status: AC
  Administered 2019-05-28: 14:00:00 200 mg via INTRAVENOUS
  Filled 2019-05-28: qty 8

## 2019-05-28 MED ORDER — DEXAMETHASONE SODIUM PHOSPHATE 10 MG/ML IJ SOLN
10.0000 mg | Freq: Once | INTRAMUSCULAR | Status: AC
  Administered 2019-05-28: 10 mg via INTRAVENOUS
  Filled 2019-05-28: qty 1

## 2019-05-28 MED ORDER — SODIUM CHLORIDE 0.9% FLUSH
10.0000 mL | INTRAVENOUS | Status: DC | PRN
  Administered 2019-05-28: 14:00:00 10 mL
  Filled 2019-05-28: qty 10

## 2019-06-13 ENCOUNTER — Encounter: Payer: Self-pay | Admitting: Oncology

## 2019-06-13 ENCOUNTER — Telehealth: Payer: Self-pay | Admitting: *Deleted

## 2019-06-13 NOTE — Telephone Encounter (Signed)
Patient called reporting that his wife has COVID and he is asking what he needs to do. Please advise

## 2019-06-13 NOTE — Telephone Encounter (Signed)
Pt will need to get tested and quarantine. If he is positive treatment will need to be delayed.

## 2019-06-13 NOTE — Telephone Encounter (Signed)
Is this regarding his upcoming treatment on 2/22? Did he say when she was tested or if he has been tested?

## 2019-06-13 NOTE — Telephone Encounter (Signed)
No he did not say

## 2019-06-14 NOTE — Telephone Encounter (Signed)
Edward Atkinson, please reschedule patient per Dr. Collie Siad recommendations date. Thank you.

## 2019-06-14 NOTE — Telephone Encounter (Signed)
Pt tested positive for COVID on 2/11 and is schedule for rituximab/bendamustine on 2/22. Should patient's treatment be moved out? if so, please advise on how far out.

## 2019-06-14 NOTE — Telephone Encounter (Signed)
I called patient this morning and he told me that he got tested last night and he is positive for COVID. I told him that we would have to reschedule his appointment and he understood. Please send schedule message for how long to wait for reschedule and call patient with new appointment.

## 2019-06-14 NOTE — Telephone Encounter (Signed)
Please move his appt to about 14 days after his COVID positive day.

## 2019-06-24 ENCOUNTER — Other Ambulatory Visit: Payer: BC Managed Care – PPO

## 2019-06-24 ENCOUNTER — Ambulatory Visit: Payer: BC Managed Care – PPO

## 2019-06-24 ENCOUNTER — Ambulatory Visit: Payer: BC Managed Care – PPO | Admitting: Oncology

## 2019-06-24 ENCOUNTER — Encounter: Payer: Self-pay | Admitting: Oncology

## 2019-06-24 NOTE — Telephone Encounter (Signed)
Done...  Appts has been R/S to 3/4 and 3/5 per pt request

## 2019-06-24 NOTE — Telephone Encounter (Signed)
Will you review his schedule and see when/if we can accommodate the requested change?

## 2019-06-24 NOTE — Telephone Encounter (Signed)
[name],   Can you reschedule his appts?  Sonia Baller

## 2019-06-25 ENCOUNTER — Ambulatory Visit: Payer: BC Managed Care – PPO

## 2019-06-27 ENCOUNTER — Inpatient Hospital Stay: Payer: BC Managed Care – PPO

## 2019-06-27 ENCOUNTER — Inpatient Hospital Stay: Payer: BC Managed Care – PPO | Admitting: Oncology

## 2019-06-28 ENCOUNTER — Inpatient Hospital Stay: Payer: BC Managed Care – PPO

## 2019-07-04 ENCOUNTER — Inpatient Hospital Stay: Payer: BC Managed Care – PPO | Attending: Oncology

## 2019-07-04 ENCOUNTER — Other Ambulatory Visit: Payer: Self-pay

## 2019-07-04 ENCOUNTER — Encounter: Payer: Self-pay | Admitting: Oncology

## 2019-07-04 ENCOUNTER — Inpatient Hospital Stay: Payer: BC Managed Care – PPO

## 2019-07-04 ENCOUNTER — Inpatient Hospital Stay: Payer: BC Managed Care – PPO | Attending: Oncology | Admitting: Oncology

## 2019-07-04 VITALS — BP 123/83 | HR 84 | Temp 98.5°F | Resp 18 | Wt 207.4 lb

## 2019-07-04 DIAGNOSIS — C8280 Other types of follicular lymphoma, unspecified site: Secondary | ICD-10-CM

## 2019-07-04 DIAGNOSIS — R599 Enlarged lymph nodes, unspecified: Secondary | ICD-10-CM | POA: Diagnosis not present

## 2019-07-04 DIAGNOSIS — Z8616 Personal history of COVID-19: Secondary | ICD-10-CM | POA: Diagnosis not present

## 2019-07-04 DIAGNOSIS — R161 Splenomegaly, not elsewhere classified: Secondary | ICD-10-CM | POA: Diagnosis not present

## 2019-07-04 DIAGNOSIS — Z86718 Personal history of other venous thrombosis and embolism: Secondary | ICD-10-CM

## 2019-07-04 DIAGNOSIS — Z86711 Personal history of pulmonary embolism: Secondary | ICD-10-CM | POA: Insufficient documentation

## 2019-07-04 DIAGNOSIS — R7401 Elevation of levels of liver transaminase levels: Secondary | ICD-10-CM | POA: Insufficient documentation

## 2019-07-04 DIAGNOSIS — R0682 Tachypnea, not elsewhere classified: Secondary | ICD-10-CM | POA: Insufficient documentation

## 2019-07-04 DIAGNOSIS — C8293 Follicular lymphoma, unspecified, intra-abdominal lymph nodes: Secondary | ICD-10-CM | POA: Diagnosis not present

## 2019-07-04 DIAGNOSIS — E119 Type 2 diabetes mellitus without complications: Secondary | ICD-10-CM | POA: Diagnosis not present

## 2019-07-04 DIAGNOSIS — I2699 Other pulmonary embolism without acute cor pulmonale: Secondary | ICD-10-CM | POA: Diagnosis not present

## 2019-07-04 DIAGNOSIS — C8223 Follicular lymphoma grade III, unspecified, intra-abdominal lymph nodes: Secondary | ICD-10-CM | POA: Diagnosis present

## 2019-07-04 DIAGNOSIS — Z95828 Presence of other vascular implants and grafts: Secondary | ICD-10-CM

## 2019-07-04 DIAGNOSIS — Z7984 Long term (current) use of oral hypoglycemic drugs: Secondary | ICD-10-CM | POA: Insufficient documentation

## 2019-07-04 DIAGNOSIS — Z7901 Long term (current) use of anticoagulants: Secondary | ICD-10-CM | POA: Diagnosis not present

## 2019-07-04 DIAGNOSIS — Z5111 Encounter for antineoplastic chemotherapy: Secondary | ICD-10-CM | POA: Diagnosis not present

## 2019-07-04 DIAGNOSIS — R5383 Other fatigue: Secondary | ICD-10-CM | POA: Insufficient documentation

## 2019-07-04 LAB — CBC WITH DIFFERENTIAL/PLATELET
Abs Immature Granulocytes: 0.02 10*3/uL (ref 0.00–0.07)
Basophils Absolute: 0 10*3/uL (ref 0.0–0.1)
Basophils Relative: 0 %
Eosinophils Absolute: 0 10*3/uL (ref 0.0–0.5)
Eosinophils Relative: 1 %
HCT: 41.1 % (ref 39.0–52.0)
Hemoglobin: 13.9 g/dL (ref 13.0–17.0)
Immature Granulocytes: 1 %
Lymphocytes Relative: 23 %
Lymphs Abs: 1 10*3/uL (ref 0.7–4.0)
MCH: 30.2 pg (ref 26.0–34.0)
MCHC: 33.8 g/dL (ref 30.0–36.0)
MCV: 89.2 fL (ref 80.0–100.0)
Monocytes Absolute: 0.4 10*3/uL (ref 0.1–1.0)
Monocytes Relative: 10 %
Neutro Abs: 2.7 10*3/uL (ref 1.7–7.7)
Neutrophils Relative %: 65 %
Platelets: 139 10*3/uL — ABNORMAL LOW (ref 150–400)
RBC: 4.61 MIL/uL (ref 4.22–5.81)
RDW: 12.5 % (ref 11.5–15.5)
WBC: 4.1 10*3/uL (ref 4.0–10.5)
nRBC: 0 % (ref 0.0–0.2)

## 2019-07-04 LAB — COMPREHENSIVE METABOLIC PANEL
ALT: 104 U/L — ABNORMAL HIGH (ref 0–44)
AST: 76 U/L — ABNORMAL HIGH (ref 15–41)
Albumin: 4.1 g/dL (ref 3.5–5.0)
Alkaline Phosphatase: 81 U/L (ref 38–126)
Anion gap: 9 (ref 5–15)
BUN: 16 mg/dL (ref 8–23)
CO2: 23 mmol/L (ref 22–32)
Calcium: 8.6 mg/dL — ABNORMAL LOW (ref 8.9–10.3)
Chloride: 103 mmol/L (ref 98–111)
Creatinine, Ser: 0.9 mg/dL (ref 0.61–1.24)
GFR calc Af Amer: >60 mL/min (ref >60)
GFR calc non Af Amer: >60 mL/min (ref >60)
Glucose, Bld: 127 mg/dL — ABNORMAL HIGH (ref 70–99)
Potassium: 4.1 mmol/L (ref 3.5–5.1)
Sodium: 135 mmol/L (ref 135–145)
Total Bilirubin: 0.5 mg/dL (ref 0.3–1.2)
Total Protein: 6.9 g/dL (ref 6.5–8.1)

## 2019-07-04 MED ORDER — SODIUM CHLORIDE 0.9% FLUSH
10.0000 mL | Freq: Once | INTRAVENOUS | Status: AC
  Administered 2019-07-04: 10 mL via INTRAVENOUS
  Filled 2019-07-04: qty 10

## 2019-07-04 MED ORDER — HEPARIN SOD (PORK) LOCK FLUSH 100 UNIT/ML IV SOLN
INTRAVENOUS | Status: AC
  Filled 2019-07-04: qty 5

## 2019-07-04 MED ORDER — HEPARIN SOD (PORK) LOCK FLUSH 100 UNIT/ML IV SOLN
500.0000 [IU] | Freq: Once | INTRAVENOUS | Status: AC
  Administered 2019-07-04: 500 [IU] via INTRAVENOUS
  Filled 2019-07-04: qty 5

## 2019-07-04 NOTE — Progress Notes (Signed)
No treatment today per [facility] CMA per Dr. Tasia Catchings.

## 2019-07-04 NOTE — Progress Notes (Signed)
Patient her for follow up. Pt has had some congestion in his chest that has not improved.

## 2019-07-05 ENCOUNTER — Inpatient Hospital Stay: Payer: BC Managed Care – PPO

## 2019-07-06 DIAGNOSIS — Z86711 Personal history of pulmonary embolism: Secondary | ICD-10-CM | POA: Insufficient documentation

## 2019-07-06 DIAGNOSIS — Z86718 Personal history of other venous thrombosis and embolism: Secondary | ICD-10-CM | POA: Insufficient documentation

## 2019-07-06 DIAGNOSIS — R7401 Elevation of levels of liver transaminase levels: Secondary | ICD-10-CM | POA: Insufficient documentation

## 2019-07-06 NOTE — Progress Notes (Signed)
Hematology/Oncology Follow Up Note [facility]  Telephone:([phone] Fax:([phone]  Patient Care Team: Rusty Aus, MD as PCP - General (Internal Medicine)   Name of the patient: Edward Atkinson  [id number]  10/23/1960   REASON FOR VISIT  follow-up for management of pulmonary embolism, follicular cell lymphoma.  PERTINENT ONCOLOGY HISTORY # 01/31/2019 Unprovoked PE,  CT chest PE protocol reviewed bilateral massive pulmonary embolism with right heart strain and patient was started on heparin drip.  Patient was tachypneic and tachycardia.  Vascular surgeon was consulted and the patient was taken to the OR for thrombectomy and thrombolysis.  Patient also had selective catheter placement right upper lobe, middle lobe, lower lobe pulmonary arteries, left upper and lower pulmonary arteries. Ultrasound venous duplex bilateral showed DVT of the left lower extremity with nonocclusive thrombus in the left common femoral vein.  Some of the common femoral venous thrombus does extend just into the GSV across SF J.  No evidence of right lower extremity DVT. Patient was started on heparin drip for anticoagulation. Patient is status post embolectomy by vascular surgery. #Additional CT pancreas was done during that admission which showed peripancreatic/retroperitoneal and contiguous small bowel mesentery nodal mass, retroperitoneal adenopathy, gastrohepatic ligament lymphadenopathy, left abdominal omental nodule CT-guided omentum biopsy was done during hospital.  Patient was switched to Lovenox at discharge.  #  PET scan done on 02/11/2019 which showed large conglomerate retroperitoneal and mesenteric mass with maximal SUV 10.6 compatible with Deauville 5 disease.  Deauville 4 adenopathy in the pelvis. A left axillary lymph node measuring 0.7 cm in short axis has a maximum SUV of 3.3. Mild splenomegaly without focal splenic lesion identified. #02/21/2019, bone marrow biopsy  showed no lymphoma involvement.  There might be lymphocyte aggregates that were being cut away on the deeper levels.  Additional IHC did not show residual echograms.  INTERVAL HISTORY 59 y.o. male with newly diagnosed pulmonary embolism and follicular lymphoma present for follow-up-evaluation prior to cycle 4 BR Patient reports feeling well.  Increased fatigue, especially after each chemotherapy treatments. Patient was tested positive for COVID-19 on 06/13/2019.  Chemotherapy was delayed due to COVID-19 positivity. Patient reports that he had very mild symptoms.  Today he denies no new complaints.  Feeling well.  Denies any shortness of breath, fever, chills. Patient is also on Eliquis 5 mg twice daily.  Denies any bleeding events.   Review of Systems  Constitutional: Negative for appetite change, chills, fatigue, fever and unexpected weight change.  HENT:   Negative for hearing loss and voice change.   Eyes: Negative for eye problems and icterus.  Respiratory: Negative for chest tightness, cough and shortness of breath.   Cardiovascular: Negative for chest pain and leg swelling.  Gastrointestinal: Negative for abdominal distention and abdominal pain.  Endocrine: Negative for hot flashes.  Genitourinary: Negative for difficulty urinating, dysuria and frequency.   Musculoskeletal: Negative for arthralgias.  Skin: Negative for itching and rash.  Neurological: Negative for light-headedness and numbness.  Hematological: Negative for adenopathy. Does not bruise/bleed easily.  Psychiatric/Behavioral: Negative for confusion.      Allergies  Allergen Reactions  . Penicillins Rash     Past Medical History:  Diagnosis Date  . Diabetes mellitus without complication ([place])   . Diverticulitis   . Follicular lymphoma of intra-abdominal lymph nodes ([place]) 02/15/2019     Past Surgical History:  Procedure Laterality Date  . CHOLECYSTECTOMY    . COLON RESECTION    . PORTA CATH INSERTION  N/A  03/11/2019   Procedure: PORTA CATH INSERTION;  Surgeon: Algernon Huxley, MD;  Location: [place] CV LAB;  Service: Cardiovascular;  Laterality: N/A;  . PULMONARY THROMBECTOMY N/A 01/29/2019   Procedure: PULMONARY THROMBECTOMY;  Surgeon: Algernon Huxley, MD;  Location: [place] CV LAB;  Service: Cardiovascular;  Laterality: N/A;    Social History   Socioeconomic History  . Marital status: Married    Spouse name: Not on file  . Number of children: Not on file  . Years of education: Not on file  . Highest education level: Not on file  Occupational History  . Not on file  Tobacco Use  . Smoking status: Never Smoker  . Smokeless tobacco: Never Used  Substance and Sexual Activity  . Alcohol use: Yes    Alcohol/week: 0.0 - 2.0 standard drinks    Comment: occasional  . Drug use: Never  . Sexual activity: Yes  Other Topics Concern  . Not on file  Social History Narrative  . Not on file   Social Determinants of Health   Financial Resource Strain:   . Difficulty of Paying Living Expenses: Not on file  Food Insecurity:   . Worried About Charity fundraiser in the Last Year: Not on file  . Ran Out of Food in the Last Year: Not on file  Transportation Needs:   . Lack of Transportation (Medical): Not on file  . Lack of Transportation (Non-Medical): Not on file  Physical Activity:   . Days of Exercise per Week: Not on file  . Minutes of Exercise per Session: Not on file  Stress:   . Feeling of Stress : Not on file  Social Connections:   . Frequency of Communication with Friends and Family: Not on file  . Frequency of Social Gatherings with Friends and Family: Not on file  . Attends Religious Services: Not on file  . Active Member of Clubs or Organizations: Not on file  . Attends Archivist Meetings: Not on file  . Marital Status: Not on file  Intimate Partner Violence:   . Fear of Current or Ex-Partner: Not on file  . Emotionally Abused: Not on file  . Physically  Abused: Not on file  . Sexually Abused: Not on file    Family History  Problem Relation Age of Onset  . Diabetes Brother      Current Outpatient Medications:  .  apixaban (ELIQUIS) 5 MG TABS tablet, Take 1 tablet (5 mg total) by mouth 2 (two) times daily., Disp: 60 tablet, Rfl: 11 .  dextromethorphan-guaiFENesin (MUCINEX DM) 30-600 MG 12hr tablet, Take 1 tablet by mouth 2 (two) times daily as needed for cough., Disp: , Rfl:  .  glimepiride (AMARYL) 4 MG tablet, Take 4 mg by mouth daily with breakfast., Disp: , Rfl:  .  lidocaine-prilocaine (EMLA) cream, Apply 1 application topically as needed., Disp: 30 g, Rfl: 0 .  prochlorperazine (COMPAZINE) 10 MG tablet, Take 1 tablet (10 mg total) by mouth every 6 (six) hours as needed (Nausea or vomiting)., Disp: 30 tablet, Rfl: 1  Physical exam:  Vitals:   07/04/19 0844  BP: 123/83  Pulse: 84  Resp: 18  Temp: 98.5 F (36.9 C)  Weight: 207 lb 6.4 oz (94.1 kg)   Physical Exam Constitutional:      General: He is not in acute distress. HENT:     Head: Normocephalic and atraumatic.  Eyes:     General: No scleral icterus.  Pupils: Pupils are equal, round, and reactive to light.  Cardiovascular:     Rate and Rhythm: Normal rate and regular rhythm.     Heart sounds: Normal heart sounds.  Pulmonary:     Effort: Pulmonary effort is normal. No respiratory distress.     Breath sounds: No wheezing.  Abdominal:     General: Bowel sounds are normal. There is no distension.     Palpations: Abdomen is soft. There is no mass.     Tenderness: There is no abdominal tenderness.  Musculoskeletal:        General: No deformity. Normal range of motion.     Cervical back: Normal range of motion and neck supple.  Skin:    General: Skin is warm and dry.     Findings: No erythema or rash.  Neurological:     Mental Status: He is alert and oriented to person, place, and time. Mental status is at baseline.     Cranial Nerves: No cranial nerve deficit.      Coordination: Coordination normal.  Psychiatric:        Mood and Affect: Mood normal.        Behavior: Behavior normal.        Thought Content: Thought content normal.     CMP Latest Ref Rng & Units 07/04/2019  Glucose 70 - 99 mg/dL 127(H)  BUN 8 - 23 mg/dL 16  Creatinine 0.61 - 1.24 mg/dL 0.90  Sodium 135 - 145 mmol/L 135  Potassium 3.5 - 5.1 mmol/L 4.1  Chloride 98 - 111 mmol/L 103  CO2 22 - 32 mmol/L 23  Calcium 8.9 - 10.3 mg/dL 8.6(L)  Total Protein 6.5 - 8.1 g/dL 6.9  Total Bilirubin 0.3 - 1.2 mg/dL 0.5  Alkaline Phos 38 - 126 U/L 81  AST 15 - 41 U/L 76(H)  ALT 0 - 44 U/L 104(H)   CBC Latest Ref Rng & Units 07/04/2019  WBC 4.0 - 10.5 K/uL 4.1  Hemoglobin 13.0 - 17.0 g/dL 13.9  Hematocrit 39.0 - 52.0 % 41.1  Platelets 150 - 400 K/uL 139(L)   RADIOGRAPHIC STUDIES: I have personally reviewed the radiological images as listed and agreed with the findings in the report. No results found.   Assessment and plan 1. Follicular lymphoma of intra-abdominal lymph nodes, unspecified follicular lymphoma type ([place])   2. Encounter for antineoplastic chemotherapy   3. History of pulmonary embolism   4. Transaminitis   5. History of DVT (deep vein thrombosis)    Stage III follicular lymphoma Labs reviewed and discussed with patient. Hold chemotherapy due to transaminitis.  Transaminitis, etiology unknown.  Patient denies any symptoms. Denies any alcohol use. Questionable reactive changes secondary to recent COVID-19 infection. Suggest patient to hold chemotherapy today.  Repeat blood work in 1 week.    #History of acute pulmonary embolism and left femoral vein thrombosis in October 2020. Continue anticoagulation with 5 mg twice daily. He tolerates anticoagulation well.  Consider decrease to maintenance 2.5 mg twice daily in the future.   Follow-up in 1 week for evaluation prior to cycle 5 BR.   Earlie Server, MD, PhD Hematology Oncology [facility] Pager- [phone] 07/06/2019

## 2019-07-11 ENCOUNTER — Inpatient Hospital Stay (HOSPITAL_BASED_OUTPATIENT_CLINIC_OR_DEPARTMENT_OTHER): Payer: BC Managed Care – PPO | Admitting: Oncology

## 2019-07-11 ENCOUNTER — Inpatient Hospital Stay: Payer: BC Managed Care – PPO

## 2019-07-11 ENCOUNTER — Other Ambulatory Visit: Payer: Self-pay

## 2019-07-11 ENCOUNTER — Encounter: Payer: Self-pay | Admitting: Oncology

## 2019-07-11 VITALS — BP 121/87 | HR 80 | Temp 96.9°F | Resp 16 | Wt 207.6 lb

## 2019-07-11 DIAGNOSIS — C8293 Follicular lymphoma, unspecified, intra-abdominal lymph nodes: Secondary | ICD-10-CM

## 2019-07-11 DIAGNOSIS — C8280 Other types of follicular lymphoma, unspecified site: Secondary | ICD-10-CM

## 2019-07-11 DIAGNOSIS — R7401 Elevation of levels of liver transaminase levels: Secondary | ICD-10-CM | POA: Diagnosis not present

## 2019-07-11 DIAGNOSIS — C8223 Follicular lymphoma grade III, unspecified, intra-abdominal lymph nodes: Secondary | ICD-10-CM | POA: Diagnosis not present

## 2019-07-11 LAB — CBC WITH DIFFERENTIAL/PLATELET
Abs Immature Granulocytes: 0.04 10*3/uL (ref 0.00–0.07)
Basophils Absolute: 0 10*3/uL (ref 0.0–0.1)
Basophils Relative: 1 %
Eosinophils Absolute: 0 10*3/uL (ref 0.0–0.5)
Eosinophils Relative: 1 %
HCT: 42.4 % (ref 39.0–52.0)
Hemoglobin: 14.2 g/dL (ref 13.0–17.0)
Immature Granulocytes: 1 %
Lymphocytes Relative: 23 %
Lymphs Abs: 1 10*3/uL (ref 0.7–4.0)
MCH: 30 pg (ref 26.0–34.0)
MCHC: 33.5 g/dL (ref 30.0–36.0)
MCV: 89.5 fL (ref 80.0–100.0)
Monocytes Absolute: 0.5 10*3/uL (ref 0.1–1.0)
Monocytes Relative: 11 %
Neutro Abs: 2.8 10*3/uL (ref 1.7–7.7)
Neutrophils Relative %: 63 %
Platelets: 173 10*3/uL (ref 150–400)
RBC: 4.74 MIL/uL (ref 4.22–5.81)
RDW: 11.9 % (ref 11.5–15.5)
WBC: 4.4 10*3/uL (ref 4.0–10.5)
nRBC: 0 % (ref 0.0–0.2)

## 2019-07-11 LAB — COMPREHENSIVE METABOLIC PANEL
ALT: 84 U/L — ABNORMAL HIGH (ref 0–44)
AST: 56 U/L — ABNORMAL HIGH (ref 15–41)
Albumin: 4.1 g/dL (ref 3.5–5.0)
Alkaline Phosphatase: 77 U/L (ref 38–126)
Anion gap: 8 (ref 5–15)
BUN: 19 mg/dL (ref 8–23)
CO2: 24 mmol/L (ref 22–32)
Calcium: 8.8 mg/dL — ABNORMAL LOW (ref 8.9–10.3)
Chloride: 105 mmol/L (ref 98–111)
Creatinine, Ser: 0.8 mg/dL (ref 0.61–1.24)
GFR calc Af Amer: >60 mL/min (ref >60)
GFR calc non Af Amer: >60 mL/min (ref >60)
Glucose, Bld: 122 mg/dL — ABNORMAL HIGH (ref 70–99)
Potassium: 4.2 mmol/L (ref 3.5–5.1)
Sodium: 137 mmol/L (ref 135–145)
Total Bilirubin: 0.7 mg/dL (ref 0.3–1.2)
Total Protein: 6.9 g/dL (ref 6.5–8.1)

## 2019-07-11 LAB — HEPATITIS PANEL, ACUTE
HCV Ab: NONREACTIVE
Hep A IgM: NONREACTIVE
Hep B C IgM: NONREACTIVE
Hepatitis B Surface Ag: NONREACTIVE

## 2019-07-11 MED ORDER — HEPARIN SOD (PORK) LOCK FLUSH 100 UNIT/ML IV SOLN
500.0000 [IU] | Freq: Once | INTRAVENOUS | Status: AC
  Administered 2019-07-11: 500 [IU] via INTRAVENOUS
  Filled 2019-07-11: qty 5

## 2019-07-11 MED ORDER — SODIUM CHLORIDE 0.9% FLUSH
10.0000 mL | Freq: Once | INTRAVENOUS | Status: AC
  Administered 2019-07-11: 10 mL via INTRAVENOUS
  Filled 2019-07-11: qty 10

## 2019-07-11 NOTE — Progress Notes (Signed)
Patient does not offer any problems today.  

## 2019-07-11 NOTE — Progress Notes (Signed)
Hematology/Oncology Follow Up Note [facility]  [phone] [phone]  Patient Care Team: Rusty Aus, MD as PCP - General (Internal Medicine)   Name of the patient: Edward Atkinson  [id number]  1961/04/30   REASON FOR VISIT  follow-up for management of pulmonary embolism, follicular cell lymphoma.  PERTINENT ONCOLOGY HISTORY # 01/31/2019 Unprovoked PE,  CT chest PE protocol reviewed bilateral massive pulmonary embolism with right heart strain and patient was started on heparin drip.  Patient was tachypneic and tachycardia.  Vascular surgeon was consulted and the patient was taken to the OR for thrombectomy and thrombolysis.  Patient also had selective catheter placement right upper lobe, middle lobe, lower lobe pulmonary arteries, left upper and lower pulmonary arteries. Ultrasound venous duplex bilateral showed DVT of the left lower extremity with nonocclusive thrombus in the left common femoral vein.  Some of the common femoral venous thrombus does extend just into the GSV across SF J.  No evidence of right lower extremity DVT. Patient was started on heparin drip for anticoagulation. Patient is status post embolectomy by vascular surgery. #Additional CT pancreas was done during that admission which showed peripancreatic/retroperitoneal and contiguous small bowel mesentery nodal mass, retroperitoneal adenopathy, gastrohepatic ligament lymphadenopathy, left abdominal omental nodule CT-guided omentum biopsy was done during hospital.  Patient was switched to Lovenox at discharge.  #  PET scan done on 02/11/2019 which showed large conglomerate retroperitoneal and mesenteric mass with maximal SUV 10.6 compatible with Deauville 5 disease.  Deauville 4 adenopathy in the pelvis. A left axillary lymph node measuring 0.7 cm in short axis has a maximum SUV of 3.3. Mild splenomegaly without focal splenic lesion identified. #02/21/2019, bone marrow biopsy  showed no lymphoma involvement.  There might be lymphocyte aggregates that were being cut away on the deeper levels.  Additional IHC did not show residual echograms.  INTERVAL HISTORY 59 y.o. male with newly diagnosed pulmonary embolism and follicular lymphoma present for follow-up-evaluation prior to cycle 4 BR Patient reports feeling well at baseline.  Denies any shortness of breath.  Continues to have some chest congestions in the morning. Denies any fever, chills, shortness of breath, chest pain, denies nausea or vomiting.  Patient takes Eliquis 5 mg twice daily, denies any bleeding events.   Review of Systems  Constitutional: Negative for appetite change, chills, fatigue, fever and unexpected weight change.  HENT:   Negative for hearing loss and voice change.   Eyes: Negative for eye problems and icterus.  Respiratory: Negative for chest tightness, cough and shortness of breath.   Cardiovascular: Negative for chest pain and leg swelling.  Gastrointestinal: Negative for abdominal distention and abdominal pain.  Endocrine: Negative for hot flashes.  Genitourinary: Negative for difficulty urinating, dysuria and frequency.   Musculoskeletal: Negative for arthralgias.  Skin: Negative for itching and rash.  Neurological: Negative for light-headedness and numbness.  Hematological: Negative for adenopathy. Does not bruise/bleed easily.  Psychiatric/Behavioral: Negative for confusion.      Allergies  Allergen Reactions  . Penicillins Rash     Past Medical History:  Diagnosis Date  . Diabetes mellitus without complication ([place])   . Diverticulitis   . Follicular lymphoma of intra-abdominal lymph nodes ([place]) 02/15/2019     Past Surgical History:  Procedure Laterality Date  . CHOLECYSTECTOMY    . COLON RESECTION    . PORTA CATH INSERTION N/A 03/11/2019   Procedure: PORTA CATH INSERTION;  Surgeon: Algernon Huxley, MD;  Location: [place] [facility];  Service: Cardiovascular;   Laterality: N/A;  . PULMONARY THROMBECTOMY N/A 01/29/2019   Procedure: PULMONARY THROMBECTOMY;  Surgeon: Algernon Huxley, MD;  Location: [place] [facility];  Service: Cardiovascular;  Laterality: N/A;    Social History   Socioeconomic History  . Marital status: Married    Spouse name: Not on file  . Number of children: Not on file  . Years of education: Not on file  . Highest education level: Not on file  Occupational History  . Not on file  Tobacco Use  . Smoking status: Never Smoker  . Smokeless tobacco: Never Used  Substance and Sexual Activity  . Alcohol use: Yes    Alcohol/week: 0.0 - 2.0 standard drinks    Comment: occasional  . Drug use: Never  . Sexual activity: Yes  Other Topics Concern  . Not on file  Social History Narrative  . Not on file   Social Determinants of Health   Financial Resource Strain:   . Difficulty of Paying Living Expenses:   Food Insecurity:   . Worried About Charity fundraiser in the Last Year:   . Arboriculturist in the Last Year:   Transportation Needs:   . Film/video editor (Medical):   Marland Kitchen Lack of Transportation (Non-Medical):   Physical Activity:   . Days of Exercise per Week:   . Minutes of Exercise per Session:   Stress:   . Feeling of Stress :   Social Connections:   . Frequency of Communication with Friends and Family:   . Frequency of Social Gatherings with Friends and Family:   . Attends Religious Services:   . Active Member of Clubs or Organizations:   . Attends Archivist Meetings:   Marland Kitchen Marital Status:   Intimate Partner Violence:   . Fear of Current or Ex-Partner:   . Emotionally Abused:   Marland Kitchen Physically Abused:   . Sexually Abused:     Family History  Problem Relation Age of Onset  . Diabetes Brother      Current Outpatient Medications:  .  apixaban (ELIQUIS) 5 MG TABS tablet, Take 1 tablet (5 mg total) by mouth 2 (two) times daily., Disp: 60 tablet, Rfl: 11 .  dextromethorphan-guaiFENesin (MUCINEX  DM) 30-600 MG 12hr tablet, Take 1 tablet by mouth 2 (two) times daily as needed for cough., Disp: , Rfl:  .  glimepiride (AMARYL) 4 MG tablet, Take 4 mg by mouth daily with breakfast., Disp: , Rfl:  .  lidocaine-prilocaine (EMLA) cream, Apply 1 application topically as needed., Disp: 30 g, Rfl: 0 .  prochlorperazine (COMPAZINE) 10 MG tablet, Take 1 tablet (10 mg total) by mouth every 6 (six) hours as needed (Nausea or vomiting)., Disp: 30 tablet, Rfl: 1  Physical exam:  Vitals:   07/11/19 0835  BP: 121/87  Pulse: 80  Resp: 16  Temp: (!) 96.9 F (36.1 C)  Weight: 207 lb 9.6 oz (94.2 kg)   Physical Exam Constitutional:      General: He is not in acute distress. HENT:     Head: Normocephalic and atraumatic.  Eyes:     General: No scleral icterus.    Pupils: Pupils are equal, round, and reactive to light.  Cardiovascular:     Rate and Rhythm: Normal rate and regular rhythm.     Heart sounds: Normal heart sounds.  Pulmonary:     Effort: Pulmonary effort is normal. No respiratory distress.     Breath sounds: No wheezing.  Abdominal:     General: Bowel sounds are normal. There is no distension.     Palpations: Abdomen is soft. There is no mass.     Tenderness: There is no abdominal tenderness.  Musculoskeletal:        General: No deformity. Normal range of motion.     Cervical back: Normal range of motion and neck supple.  Skin:    General: Skin is warm and dry.     Findings: No erythema or rash.  Neurological:     Mental Status: He is alert and oriented to person, place, and time. Mental status is at baseline.     Cranial Nerves: No cranial nerve deficit.     Coordination: Coordination normal.  Psychiatric:        Mood and Affect: Mood normal.        Behavior: Behavior normal.        Thought Content: Thought content normal.     CMP Latest Ref Rng & Units 07/11/2019  Glucose 70 - 99 mg/dL 122(H)  BUN 8 - 23 mg/dL 19  Creatinine 0.61 - 1.24 mg/dL 0.80  Sodium 135 - 145  mmol/L 137  Potassium 3.5 - 5.1 mmol/L 4.2  Chloride 98 - 111 mmol/L 105  CO2 22 - 32 mmol/L 24  Calcium 8.9 - 10.3 mg/dL 8.8(L)  Total Protein 6.5 - 8.1 g/dL 6.9  Total Bilirubin 0.3 - 1.2 mg/dL 0.7  Alkaline Phos 38 - 126 U/L 77  AST 15 - 41 U/L 56(H)  ALT 0 - 44 U/L 84(H)   CBC Latest Ref Rng & Units 07/11/2019  WBC 4.0 - 10.5 K/uL 4.4  Hemoglobin 13.0 - 17.0 g/dL 14.2  Hematocrit 39.0 - 52.0 % 42.4  Platelets 150 - 400 K/uL 173   RADIOGRAPHIC STUDIES: I have personally reviewed the radiological images as listed and agreed with the findings in the report. No results found.   Assessment and plan 1. Follicular lymphoma of intra-abdominal lymph nodes, unspecified follicular lymphoma type ([place])    Stage III follicular lymphoma Labs reviewed and discussed with patient. Continue to hold chemotherapy due to transaminitis.  Etiology of transaminitis likely secondary to recent Covid infection. He is asymptomatic. I will repeat hepatitis panel today. Weekly evaluation.  Hopefully transaminitis resolve to be less than 1.5 upper normal limit I will proceed with BR.   Follow-up in 1 week for evaluation prior to cycle 5 BR.   Earlie Server, MD, PhD Hematology Oncology [facility] Pager- [phone] 07/11/2019

## 2019-07-11 NOTE — Progress Notes (Signed)
Per Benjamine Mola RN per Dr. Tasia Catchings, no treatment at this time. Hepatic Panel drawn as ordered and sent to lab. Pt stable at discharge.

## 2019-07-12 ENCOUNTER — Inpatient Hospital Stay: Payer: BC Managed Care – PPO

## 2019-07-18 ENCOUNTER — Inpatient Hospital Stay: Payer: BC Managed Care – PPO

## 2019-07-18 ENCOUNTER — Inpatient Hospital Stay (HOSPITAL_BASED_OUTPATIENT_CLINIC_OR_DEPARTMENT_OTHER): Payer: BC Managed Care – PPO | Admitting: Oncology

## 2019-07-18 ENCOUNTER — Encounter: Payer: Self-pay | Admitting: Oncology

## 2019-07-18 VITALS — BP 116/82 | HR 93 | Temp 98.0°F | Resp 18 | Wt 204.0 lb

## 2019-07-18 DIAGNOSIS — C8293 Follicular lymphoma, unspecified, intra-abdominal lymph nodes: Secondary | ICD-10-CM | POA: Diagnosis not present

## 2019-07-18 DIAGNOSIS — C8280 Other types of follicular lymphoma, unspecified site: Secondary | ICD-10-CM

## 2019-07-18 DIAGNOSIS — R7401 Elevation of levels of liver transaminase levels: Secondary | ICD-10-CM | POA: Diagnosis not present

## 2019-07-18 DIAGNOSIS — Z5111 Encounter for antineoplastic chemotherapy: Secondary | ICD-10-CM | POA: Diagnosis not present

## 2019-07-18 DIAGNOSIS — C8223 Follicular lymphoma grade III, unspecified, intra-abdominal lymph nodes: Secondary | ICD-10-CM | POA: Diagnosis not present

## 2019-07-18 LAB — COMPREHENSIVE METABOLIC PANEL
ALT: 45 U/L — ABNORMAL HIGH (ref 0–44)
AST: 28 U/L (ref 15–41)
Albumin: 4.3 g/dL (ref 3.5–5.0)
Alkaline Phosphatase: 73 U/L (ref 38–126)
Anion gap: 8 (ref 5–15)
BUN: 15 mg/dL (ref 8–23)
CO2: 23 mmol/L (ref 22–32)
Calcium: 8.8 mg/dL — ABNORMAL LOW (ref 8.9–10.3)
Chloride: 103 mmol/L (ref 98–111)
Creatinine, Ser: 1.02 mg/dL (ref 0.61–1.24)
GFR calc Af Amer: >60 mL/min (ref >60)
GFR calc non Af Amer: >60 mL/min (ref >60)
Glucose, Bld: 118 mg/dL — ABNORMAL HIGH (ref 70–99)
Potassium: 4 mmol/L (ref 3.5–5.1)
Sodium: 134 mmol/L — ABNORMAL LOW (ref 135–145)
Total Bilirubin: 0.8 mg/dL (ref 0.3–1.2)
Total Protein: 7 g/dL (ref 6.5–8.1)

## 2019-07-18 LAB — CBC WITH DIFFERENTIAL/PLATELET
Abs Immature Granulocytes: 0.03 10*3/uL (ref 0.00–0.07)
Basophils Absolute: 0 10*3/uL (ref 0.0–0.1)
Basophils Relative: 1 %
Eosinophils Absolute: 0 10*3/uL (ref 0.0–0.5)
Eosinophils Relative: 1 %
HCT: 41.3 % (ref 39.0–52.0)
Hemoglobin: 14.5 g/dL (ref 13.0–17.0)
Immature Granulocytes: 1 %
Lymphocytes Relative: 18 %
Lymphs Abs: 1.2 10*3/uL (ref 0.7–4.0)
MCH: 30.2 pg (ref 26.0–34.0)
MCHC: 35.1 g/dL (ref 30.0–36.0)
MCV: 86 fL (ref 80.0–100.0)
Monocytes Absolute: 0.7 10*3/uL (ref 0.1–1.0)
Monocytes Relative: 11 %
Neutro Abs: 4.5 10*3/uL (ref 1.7–7.7)
Neutrophils Relative %: 68 %
Platelets: 180 10*3/uL (ref 150–400)
RBC: 4.8 MIL/uL (ref 4.22–5.81)
RDW: 11.9 % (ref 11.5–15.5)
WBC: 6.4 10*3/uL (ref 4.0–10.5)
nRBC: 0 % (ref 0.0–0.2)

## 2019-07-18 MED ORDER — SODIUM CHLORIDE 0.9 % IV SOLN
90.0000 mg/m2 | Freq: Once | INTRAVENOUS | Status: DC
  Filled 2019-07-18 (×2): qty 8

## 2019-07-18 MED ORDER — DIPHENHYDRAMINE HCL 25 MG PO CAPS
50.0000 mg | ORAL_CAPSULE | Freq: Once | ORAL | Status: AC
  Administered 2019-07-18: 50 mg via ORAL
  Filled 2019-07-18: qty 2

## 2019-07-18 MED ORDER — HEPARIN SOD (PORK) LOCK FLUSH 100 UNIT/ML IV SOLN
500.0000 [IU] | Freq: Once | INTRAVENOUS | Status: AC
  Administered 2019-07-18: 500 [IU] via INTRAVENOUS
  Filled 2019-07-18: qty 5

## 2019-07-18 MED ORDER — DEXAMETHASONE SODIUM PHOSPHATE 10 MG/ML IJ SOLN
10.0000 mg | Freq: Once | INTRAMUSCULAR | Status: AC
  Administered 2019-07-18: 10 mg via INTRAVENOUS
  Filled 2019-07-18: qty 1

## 2019-07-18 MED ORDER — SODIUM CHLORIDE 0.9 % IV SOLN
90.0000 mg/m2 | Freq: Once | INTRAVENOUS | Status: AC
  Administered 2019-07-18: 200 mg via INTRAVENOUS
  Filled 2019-07-18: qty 8

## 2019-07-18 MED ORDER — HEPARIN SOD (PORK) LOCK FLUSH 100 UNIT/ML IV SOLN
INTRAVENOUS | Status: AC
  Filled 2019-07-18: qty 5

## 2019-07-18 MED ORDER — SODIUM CHLORIDE 0.9 % IV SOLN
375.0000 mg/m2 | Freq: Once | INTRAVENOUS | Status: AC
  Administered 2019-07-18: 800 mg via INTRAVENOUS
  Filled 2019-07-18: qty 50

## 2019-07-18 MED ORDER — SODIUM CHLORIDE 0.9 % IV SOLN
Freq: Once | INTRAVENOUS | Status: AC
  Filled 2019-07-18: qty 250

## 2019-07-18 MED ORDER — PALONOSETRON HCL INJECTION 0.25 MG/5ML
0.2500 mg | Freq: Once | INTRAVENOUS | Status: AC
  Administered 2019-07-18: 0.25 mg via INTRAVENOUS
  Filled 2019-07-18: qty 5

## 2019-07-18 MED ORDER — PALONOSETRON HCL INJECTION 0.25 MG/5ML: 0.2500 mg | Freq: Once | INTRAVENOUS | Status: DC

## 2019-07-18 MED ORDER — ACETAMINOPHEN 325 MG PO TABS
650.0000 mg | ORAL_TABLET | Freq: Once | ORAL | Status: AC
  Administered 2019-07-18: 650 mg via ORAL
  Filled 2019-07-18: qty 2

## 2019-07-18 MED ORDER — SODIUM CHLORIDE 0.9% FLUSH
10.0000 mL | INTRAVENOUS | Status: DC | PRN
  Administered 2019-07-18: 10 mL via INTRAVENOUS
  Filled 2019-07-18: qty 10

## 2019-07-18 NOTE — Progress Notes (Signed)
Patient does not offer any problems today.  

## 2019-07-18 NOTE — Progress Notes (Signed)
Hematology/Oncology Follow Up Note [facility]  [phone] [phone]  Patient Care Team: Rusty Aus, MD as PCP - General (Internal Medicine)   Name of the patient: Edward Atkinson  [id number]  07-19-1960   REASON FOR VISIT  follow-up for management of pulmonary embolism, follicular cell lymphoma.  PERTINENT ONCOLOGY HISTORY # 01/31/2019 Unprovoked PE,  CT chest PE protocol reviewed bilateral massive pulmonary embolism with right heart strain and patient was started on heparin drip.  Patient was tachypneic and tachycardia.  Vascular surgeon was consulted and the patient was taken to the OR for thrombectomy and thrombolysis.  Patient also had selective catheter placement right upper lobe, middle lobe, lower lobe pulmonary arteries, left upper and lower pulmonary arteries. Ultrasound venous duplex bilateral showed DVT of the left lower extremity with nonocclusive thrombus in the left common femoral vein.  Some of the common femoral venous thrombus does extend just into the GSV across SF J.  No evidence of right lower extremity DVT. Patient was started on heparin drip for anticoagulation. Patient is status post embolectomy by vascular surgery. #Additional CT pancreas was done during that admission which showed peripancreatic/retroperitoneal and contiguous small bowel mesentery nodal mass, retroperitoneal adenopathy, gastrohepatic ligament lymphadenopathy, left abdominal omental nodule CT-guided omentum biopsy was done during hospital.  Patient was switched to Lovenox at discharge.  #  PET scan done on 02/11/2019 which showed large conglomerate retroperitoneal and mesenteric mass with maximal SUV 10.6 compatible with Deauville 5 disease.  Deauville 4 adenopathy in the pelvis. A left axillary lymph node measuring 0.7 cm in short axis has a maximum SUV of 3.3. Mild splenomegaly without focal splenic lesion identified. #02/21/2019, bone marrow biopsy  showed no lymphoma involvement.  There might be lymphocyte aggregates that were being cut away on the deeper levels.  Additional IHC did not show residual echograms.  INTERVAL HISTORY 59 y.o. male with newly diagnosed pulmonary embolism and follicular lymphoma present for follow-up-evaluation prior to cycle 5 BR Chemotherapy has been delayed due to transaminitis, no new complaints.    Review of Systems  Constitutional: Negative for appetite change, chills, fatigue, fever and unexpected weight change.  HENT:   Negative for hearing loss and voice change.   Eyes: Negative for eye problems and icterus.  Respiratory: Negative for chest tightness, cough and shortness of breath.   Cardiovascular: Negative for chest pain and leg swelling.  Gastrointestinal: Negative for abdominal distention and abdominal pain.  Endocrine: Negative for hot flashes.  Genitourinary: Negative for difficulty urinating, dysuria and frequency.   Musculoskeletal: Negative for arthralgias.  Skin: Negative for itching and rash.  Neurological: Negative for light-headedness and numbness.  Hematological: Negative for adenopathy. Does not bruise/bleed easily.  Psychiatric/Behavioral: Negative for confusion.      Allergies  Allergen Reactions  . Penicillins Rash     Past Medical History:  Diagnosis Date  . Diabetes mellitus without complication ([place])   . Diverticulitis   . Follicular lymphoma of intra-abdominal lymph nodes ([place]) 02/15/2019     Past Surgical History:  Procedure Laterality Date  . CHOLECYSTECTOMY    . COLON RESECTION    . PORTA CATH INSERTION N/A 03/11/2019   Procedure: PORTA CATH INSERTION;  Surgeon: Algernon Huxley, MD;  Location: [place] [facility];  Service: Cardiovascular;  Laterality: N/A;  . PULMONARY THROMBECTOMY N/A 01/29/2019   Procedure: PULMONARY THROMBECTOMY;  Surgeon: Algernon Huxley, MD;  Location: [place] [facility];  Service: Cardiovascular;  Laterality: N/A;  Social History    Socioeconomic History  . Marital status: Married    Spouse name: Not on file  . Number of children: Not on file  . Years of education: Not on file  . Highest education level: Not on file  Occupational History  . Not on file  Tobacco Use  . Smoking status: Never Smoker  . Smokeless tobacco: Never Used  Substance and Sexual Activity  . Alcohol use: Yes    Alcohol/week: 0.0 - 2.0 standard drinks    Comment: occasional  . Drug use: Never  . Sexual activity: Yes  Other Topics Concern  . Not on file  Social History Narrative  . Not on file   Social Determinants of Health   Financial Resource Strain:   . Difficulty of Paying Living Expenses:   Food Insecurity:   . Worried About Charity fundraiser in the Last Year:   . Arboriculturist in the Last Year:   Transportation Needs:   . Film/video editor (Medical):   Marland Kitchen Lack of Transportation (Non-Medical):   Physical Activity:   . Days of Exercise per Week:   . Minutes of Exercise per Session:   Stress:   . Feeling of Stress :   Social Connections:   . Frequency of Communication with Friends and Family:   . Frequency of Social Gatherings with Friends and Family:   . Attends Religious Services:   . Active Member of Clubs or Organizations:   . Attends Archivist Meetings:   Marland Kitchen Marital Status:   Intimate Partner Violence:   . Fear of Current or Ex-Partner:   . Emotionally Abused:   Marland Kitchen Physically Abused:   . Sexually Abused:     Family History  Problem Relation Age of Onset  . Diabetes Brother      Current Outpatient Medications:  .  apixaban (ELIQUIS) 5 MG TABS tablet, Take 1 tablet (5 mg total) by mouth 2 (two) times daily., Disp: 60 tablet, Rfl: 11 .  dextromethorphan-guaiFENesin (MUCINEX DM) 30-600 MG 12hr tablet, Take 1 tablet by mouth 2 (two) times daily as needed for cough., Disp: , Rfl:  .  glimepiride (AMARYL) 4 MG tablet, Take 4 mg by mouth daily with breakfast., Disp: , Rfl:  .   lidocaine-prilocaine (EMLA) cream, Apply 1 application topically as needed., Disp: 30 g, Rfl: 0 .  prochlorperazine (COMPAZINE) 10 MG tablet, Take 1 tablet (10 mg total) by mouth every 6 (six) hours as needed (Nausea or vomiting)., Disp: 30 tablet, Rfl: 1  Physical exam:  Vitals:   07/18/19 0837  BP: 116/82  Pulse: 93  Resp: 18  Temp: 98 F (36.7 C)  Weight: 204 lb (92.5 kg)   Physical Exam Constitutional:      General: He is not in acute distress. HENT:     Head: Normocephalic and atraumatic.  Eyes:     General: No scleral icterus. Cardiovascular:     Rate and Rhythm: Normal rate and regular rhythm.     Heart sounds: Normal heart sounds.  Pulmonary:     Effort: Pulmonary effort is normal. No respiratory distress.     Breath sounds: No wheezing.  Abdominal:     General: Bowel sounds are normal. There is no distension.     Palpations: Abdomen is soft.  Musculoskeletal:        General: No deformity. Normal range of motion.     Cervical back: Normal range of motion and neck supple.  Skin:  General: Skin is warm and dry.     Findings: No erythema or rash.  Neurological:     Mental Status: He is alert and oriented to person, place, and time. Mental status is at baseline.     Cranial Nerves: No cranial nerve deficit.     Coordination: Coordination normal.  Psychiatric:        Mood and Affect: Mood normal.     CMP Latest Ref Rng & Units 07/11/2019  Glucose 70 - 99 mg/dL 122(H)  BUN 8 - 23 mg/dL 19  Creatinine 0.61 - 1.24 mg/dL 0.80  Sodium 135 - 145 mmol/L 137  Potassium 3.5 - 5.1 mmol/L 4.2  Chloride 98 - 111 mmol/L 105  CO2 22 - 32 mmol/L 24  Calcium 8.9 - 10.3 mg/dL 8.8(L)  Total Protein 6.5 - 8.1 g/dL 6.9  Total Bilirubin 0.3 - 1.2 mg/dL 0.7  Alkaline Phos 38 - 126 U/L 77  AST 15 - 41 U/L 56(H)  ALT 0 - 44 U/L 84(H)   CBC Latest Ref Rng & Units 07/11/2019  WBC 4.0 - 10.5 K/uL 4.4  Hemoglobin 13.0 - 17.0 g/dL 14.2  Hematocrit 39.0 - 52.0 % 42.4  Platelets  150 - 400 K/uL 173   RADIOGRAPHIC STUDIES: I have personally reviewed the radiological images as listed and agreed with the findings in the report. No results found.   Assessment and plan 1. Follicular lymphoma of intra-abdominal lymph nodes, unspecified follicular lymphoma type ([place])   2. Transaminitis   3. Encounter for antineoplastic chemotherapy    Stage III follicular lymphoma Labs are reviewed and discussed with patient. Proceed with cycle 5 BR.  Transaminitis has improved, nearly resolved Hepatitis panel is negative.   Follow-up in 3 week for evaluation prior to cycle 6 BR.after that will repeat PET   Earlie Server, MD, PhD Hematology Oncology [facility] Pager- [phone] 07/18/2019

## 2019-07-19 ENCOUNTER — Inpatient Hospital Stay: Payer: BC Managed Care – PPO

## 2019-07-19 ENCOUNTER — Other Ambulatory Visit: Payer: Self-pay

## 2019-07-19 VITALS — BP 122/79 | HR 76 | Temp 96.8°F | Resp 20

## 2019-07-19 DIAGNOSIS — C8293 Follicular lymphoma, unspecified, intra-abdominal lymph nodes: Secondary | ICD-10-CM

## 2019-07-19 DIAGNOSIS — C8280 Other types of follicular lymphoma, unspecified site: Secondary | ICD-10-CM

## 2019-07-19 DIAGNOSIS — C8223 Follicular lymphoma grade III, unspecified, intra-abdominal lymph nodes: Secondary | ICD-10-CM | POA: Diagnosis not present

## 2019-07-19 MED ORDER — SODIUM CHLORIDE 0.9% FLUSH
10.0000 mL | INTRAVENOUS | Status: DC | PRN
  Administered 2019-07-19: 10 mL
  Filled 2019-07-19: qty 10

## 2019-07-19 MED ORDER — HEPARIN SOD (PORK) LOCK FLUSH 100 UNIT/ML IV SOLN
500.0000 [IU] | Freq: Once | INTRAVENOUS | Status: AC | PRN
  Administered 2019-07-19: 500 [IU]
  Filled 2019-07-19: qty 5

## 2019-07-19 MED ORDER — HEPARIN SOD (PORK) LOCK FLUSH 100 UNIT/ML IV SOLN
INTRAVENOUS | Status: AC
  Filled 2019-07-19: qty 5

## 2019-07-19 MED ORDER — DEXAMETHASONE SODIUM PHOSPHATE 10 MG/ML IJ SOLN
10.0000 mg | Freq: Once | INTRAMUSCULAR | Status: AC
  Administered 2019-07-19: 10 mg via INTRAVENOUS
  Filled 2019-07-19: qty 1

## 2019-07-19 MED ORDER — SODIUM CHLORIDE 0.9 % IV SOLN
90.0000 mg/m2 | Freq: Once | INTRAVENOUS | Status: AC
  Administered 2019-07-19: 200 mg via INTRAVENOUS
  Filled 2019-07-19: qty 8

## 2019-07-19 MED ORDER — SODIUM CHLORIDE 0.9 % IV SOLN
Freq: Once | INTRAVENOUS | Status: AC
  Filled 2019-07-19: qty 250

## 2019-07-25 ENCOUNTER — Other Ambulatory Visit: Payer: Self-pay | Admitting: Family Medicine

## 2019-07-25 ENCOUNTER — Other Ambulatory Visit: Payer: Self-pay

## 2019-07-25 ENCOUNTER — Ambulatory Visit
Admission: RE | Admit: 2019-07-25 | Discharge: 2019-07-25 | Disposition: A | Discharge status: Home / Self Care | Payer: BC Managed Care – PPO | Source: Ambulatory Visit | Attending: Family Medicine | Admitting: Family Medicine

## 2019-07-25 DIAGNOSIS — R0602 Shortness of breath: Secondary | ICD-10-CM

## 2019-07-25 DIAGNOSIS — R000 Tachycardia, unspecified: Secondary | ICD-10-CM

## 2019-07-25 DIAGNOSIS — C8280 Other types of follicular lymphoma, unspecified site: Secondary | ICD-10-CM | POA: Insufficient documentation

## 2019-07-25 MED ORDER — IOHEXOL 350 MG/ML SOLN
75.0000 mL | Freq: Once | INTRAVENOUS | Status: AC | PRN
  Administered 2019-07-25: 75 mL via INTRAVENOUS

## 2019-07-28 ENCOUNTER — Other Ambulatory Visit: Payer: Self-pay | Admitting: Oncology

## 2019-07-28 ENCOUNTER — Encounter: Payer: Self-pay | Admitting: Oncology

## 2019-07-28 MED ORDER — LEVOFLOXACIN 500 MG PO TABS: 500.0000 mg | ORAL_TABLET | Freq: Every day | ORAL | 0 refills | Status: DC

## 2019-07-29 ENCOUNTER — Other Ambulatory Visit: Payer: Self-pay

## 2019-07-29 ENCOUNTER — Telehealth: Payer: Self-pay

## 2019-07-29 ENCOUNTER — Telehealth: Payer: Self-pay | Admitting: *Deleted

## 2019-07-29 ENCOUNTER — Emergency Department: Payer: BC Managed Care – PPO

## 2019-07-29 ENCOUNTER — Inpatient Hospital Stay
Admission: EM | Admit: 2019-07-29 | Discharge: 2019-08-01 | DRG: 871 | Disposition: A | Discharge status: Home / Self Care | Payer: BC Managed Care – PPO | Attending: Internal Medicine | Admitting: Internal Medicine

## 2019-07-29 DIAGNOSIS — Z833 Family history of diabetes mellitus: Secondary | ICD-10-CM | POA: Diagnosis not present

## 2019-07-29 DIAGNOSIS — R0602 Shortness of breath: Secondary | ICD-10-CM | POA: Diagnosis not present

## 2019-07-29 DIAGNOSIS — U071 COVID-19: Secondary | ICD-10-CM | POA: Diagnosis not present

## 2019-07-29 DIAGNOSIS — Z20822 Contact with and (suspected) exposure to covid-19: Secondary | ICD-10-CM | POA: Diagnosis present

## 2019-07-29 DIAGNOSIS — Z86711 Personal history of pulmonary embolism: Secondary | ICD-10-CM | POA: Diagnosis not present

## 2019-07-29 DIAGNOSIS — E119 Type 2 diabetes mellitus without complications: Secondary | ICD-10-CM | POA: Diagnosis not present

## 2019-07-29 DIAGNOSIS — R0902 Hypoxemia: Secondary | ICD-10-CM | POA: Diagnosis present

## 2019-07-29 DIAGNOSIS — A4189 Other specified sepsis: Principal | ICD-10-CM | POA: Diagnosis present

## 2019-07-29 DIAGNOSIS — Z7984 Long term (current) use of oral hypoglycemic drugs: Secondary | ICD-10-CM

## 2019-07-29 DIAGNOSIS — I2699 Other pulmonary embolism without acute cor pulmonale: Secondary | ICD-10-CM | POA: Diagnosis present

## 2019-07-29 DIAGNOSIS — Z79899 Other long term (current) drug therapy: Secondary | ICD-10-CM | POA: Diagnosis not present

## 2019-07-29 DIAGNOSIS — D6959 Other secondary thrombocytopenia: Secondary | ICD-10-CM | POA: Diagnosis present

## 2019-07-29 DIAGNOSIS — Z9049 Acquired absence of other specified parts of digestive tract: Secondary | ICD-10-CM | POA: Diagnosis not present

## 2019-07-29 DIAGNOSIS — D72810 Lymphocytopenia: Secondary | ICD-10-CM | POA: Diagnosis not present

## 2019-07-29 DIAGNOSIS — D696 Thrombocytopenia, unspecified: Secondary | ICD-10-CM | POA: Diagnosis not present

## 2019-07-29 DIAGNOSIS — Z880 Allergy status to penicillin: Secondary | ICD-10-CM | POA: Diagnosis not present

## 2019-07-29 DIAGNOSIS — A419 Sepsis, unspecified organism: Secondary | ICD-10-CM | POA: Diagnosis present

## 2019-07-29 DIAGNOSIS — T451X5A Adverse effect of antineoplastic and immunosuppressive drugs, initial encounter: Secondary | ICD-10-CM | POA: Diagnosis present

## 2019-07-29 DIAGNOSIS — Z8719 Personal history of other diseases of the digestive system: Secondary | ICD-10-CM

## 2019-07-29 DIAGNOSIS — Z86718 Personal history of other venous thrombosis and embolism: Secondary | ICD-10-CM

## 2019-07-29 DIAGNOSIS — J1282 Pneumonia due to coronavirus disease 2019: Secondary | ICD-10-CM | POA: Diagnosis present

## 2019-07-29 DIAGNOSIS — D849 Immunodeficiency, unspecified: Secondary | ICD-10-CM | POA: Diagnosis present

## 2019-07-29 DIAGNOSIS — C8293 Follicular lymphoma, unspecified, intra-abdominal lymph nodes: Secondary | ICD-10-CM | POA: Diagnosis present

## 2019-07-29 DIAGNOSIS — Z8616 Personal history of COVID-19: Secondary | ICD-10-CM | POA: Diagnosis not present

## 2019-07-29 DIAGNOSIS — Z7901 Long term (current) use of anticoagulants: Secondary | ICD-10-CM

## 2019-07-29 DIAGNOSIS — C8280 Other types of follicular lymphoma, unspecified site: Secondary | ICD-10-CM | POA: Diagnosis present

## 2019-07-29 DIAGNOSIS — E11649 Type 2 diabetes mellitus with hypoglycemia without coma: Secondary | ICD-10-CM

## 2019-07-29 DIAGNOSIS — Z978 Presence of other specified devices: Secondary | ICD-10-CM | POA: Diagnosis not present

## 2019-07-29 DIAGNOSIS — C8290 Follicular lymphoma, unspecified, unspecified site: Secondary | ICD-10-CM | POA: Diagnosis not present

## 2019-07-29 DIAGNOSIS — R509 Fever, unspecified: Secondary | ICD-10-CM | POA: Diagnosis not present

## 2019-07-29 DIAGNOSIS — I2782 Chronic pulmonary embolism: Secondary | ICD-10-CM | POA: Diagnosis not present

## 2019-07-29 DIAGNOSIS — D72819 Decreased white blood cell count, unspecified: Secondary | ICD-10-CM | POA: Diagnosis not present

## 2019-07-29 LAB — COMPREHENSIVE METABOLIC PANEL
ALT: 22 U/L (ref 0–44)
AST: 24 U/L (ref 15–41)
Albumin: 3.5 g/dL (ref 3.5–5.0)
Alkaline Phosphatase: 51 U/L (ref 38–126)
Anion gap: 9 (ref 5–15)
BUN: 14 mg/dL (ref 8–23)
CO2: 23 mmol/L (ref 22–32)
Calcium: 8.4 mg/dL — ABNORMAL LOW (ref 8.9–10.3)
Chloride: 103 mmol/L (ref 98–111)
Creatinine, Ser: 1.11 mg/dL (ref 0.61–1.24)
GFR calc Af Amer: >60 mL/min (ref >60)
GFR calc non Af Amer: >60 mL/min (ref >60)
Glucose, Bld: 132 mg/dL — ABNORMAL HIGH (ref 70–99)
Potassium: 3.8 mmol/L (ref 3.5–5.1)
Sodium: 135 mmol/L (ref 135–145)
Total Bilirubin: 0.5 mg/dL (ref 0.3–1.2)
Total Protein: 6.6 g/dL (ref 6.5–8.1)

## 2019-07-29 LAB — URINALYSIS, ROUTINE W REFLEX MICROSCOPIC
Bacteria, UA: NONE SEEN
Bilirubin Urine: NEGATIVE
Glucose, UA: NEGATIVE mg/dL
Hgb urine dipstick: NEGATIVE
Ketones, ur: NEGATIVE mg/dL
Leukocytes,Ua: NEGATIVE
Nitrite: NEGATIVE
Protein, ur: 30 mg/dL — AB
Specific Gravity, Urine: 1.019 (ref 1.005–1.030)
Squamous Epithelial / HPF: NONE SEEN (ref 0–5)
pH: 6 (ref 5.0–8.0)

## 2019-07-29 LAB — APTT: aPTT: 39 seconds — ABNORMAL HIGH (ref 24–36)

## 2019-07-29 LAB — CBC WITH DIFFERENTIAL/PLATELET
Abs Immature Granulocytes: 0.01 10*3/uL (ref 0.00–0.07)
Basophils Absolute: 0 10*3/uL (ref 0.0–0.1)
Basophils Relative: 0 %
Eosinophils Absolute: 0 10*3/uL (ref 0.0–0.5)
Eosinophils Relative: 0 %
HCT: 37.5 % — ABNORMAL LOW (ref 39.0–52.0)
Hemoglobin: 12.9 g/dL — ABNORMAL LOW (ref 13.0–17.0)
Immature Granulocytes: 0 %
Lymphocytes Relative: 2 %
Lymphs Abs: 0.1 10*3/uL — ABNORMAL LOW (ref 0.7–4.0)
MCH: 30.1 pg (ref 26.0–34.0)
MCHC: 34.4 g/dL (ref 30.0–36.0)
MCV: 87.4 fL (ref 80.0–100.0)
Monocytes Absolute: 0.3 10*3/uL (ref 0.1–1.0)
Monocytes Relative: 10 %
Neutro Abs: 3.1 10*3/uL (ref 1.7–7.7)
Neutrophils Relative %: 88 %
Platelets: 110 10*3/uL — ABNORMAL LOW (ref 150–400)
RBC: 4.29 MIL/uL (ref 4.22–5.81)
RDW: 12 % (ref 11.5–15.5)
WBC: 3.5 10*3/uL — ABNORMAL LOW (ref 4.0–10.5)
nRBC: 0 % (ref 0.0–0.2)

## 2019-07-29 LAB — SAVE SMEAR(SSMR), FOR PROVIDER SLIDE REVIEW

## 2019-07-29 LAB — PROTIME-INR
INR: 1.2 (ref 0.8–1.2)
Prothrombin Time: 15.1 seconds (ref 11.4–15.2)

## 2019-07-29 LAB — SARS CORONAVIRUS 2 (TAT 6-24 HRS): SARS Coronavirus 2: POSITIVE — AB

## 2019-07-29 LAB — LACTATE DEHYDROGENASE: LDH: 167 U/L (ref 98–192)

## 2019-07-29 LAB — GLUCOSE, CAPILLARY: Glucose-Capillary: 69 mg/dL — ABNORMAL LOW (ref 70–99)

## 2019-07-29 LAB — TROPONIN I (HIGH SENSITIVITY)
Troponin I (High Sensitivity): 9 ng/L (ref <18)
Troponin I (High Sensitivity): 10 ng/L (ref <18)

## 2019-07-29 LAB — LACTIC ACID, PLASMA: Lactic Acid, Venous: 1.2 mmol/L (ref 0.5–1.9)

## 2019-07-29 MED ORDER — APIXABAN 5 MG PO TABS
5.0000 mg | ORAL_TABLET | Freq: Two times a day (BID) | ORAL | Status: DC
  Administered 2019-07-29 – 2019-08-01 (×6): 5 mg via ORAL
  Filled 2019-07-29 (×6): qty 1

## 2019-07-29 MED ORDER — VANCOMYCIN HCL IN DEXTROSE 1-5 GM/200ML-% IV SOLN
1000.0000 mg | Freq: Once | INTRAVENOUS | Status: AC
  Administered 2019-07-29: 1000 mg via INTRAVENOUS
  Filled 2019-07-29: qty 200

## 2019-07-29 MED ORDER — ONDANSETRON HCL 4 MG/2ML IJ SOLN: 4.0000 mg | Freq: Three times a day (TID) | INTRAMUSCULAR | Status: DC | PRN

## 2019-07-29 MED ORDER — MOMETASONE FURO-FORMOTEROL FUM 100-5 MCG/ACT IN AERO
2.0000 | INHALATION_SPRAY | Freq: Two times a day (BID) | RESPIRATORY_TRACT | Status: DC
  Administered 2019-07-29 – 2019-08-01 (×5): 2 via RESPIRATORY_TRACT
  Filled 2019-07-29: qty 8.8

## 2019-07-29 MED ORDER — INSULIN ASPART 100 UNIT/ML ~~LOC~~ SOLN
0.0000 [IU] | Freq: Three times a day (TID) | SUBCUTANEOUS | Status: DC
  Administered 2019-07-30: 5 [IU] via SUBCUTANEOUS
  Administered 2019-07-31: 18:00:00 3 [IU] via SUBCUTANEOUS
  Administered 2019-07-31 (×2): 1 [IU] via SUBCUTANEOUS
  Administered 2019-08-01: 13:00:00 3 [IU] via SUBCUTANEOUS
  Administered 2019-08-01: 2 [IU] via SUBCUTANEOUS
  Filled 2019-07-29 (×7): qty 1

## 2019-07-29 MED ORDER — SODIUM CHLORIDE 0.9 % IV SOLN: INTRAVENOUS | Status: DC

## 2019-07-29 MED ORDER — ACETAMINOPHEN 500 MG PO TABS: 1000.0000 mg | ORAL_TABLET | Freq: Once | ORAL | Status: DC

## 2019-07-29 MED ORDER — SODIUM CHLORIDE 0.9 % IV SOLN
2.0000 g | Freq: Three times a day (TID) | INTRAVENOUS | Status: DC
  Administered 2019-07-29 – 2019-08-01 (×8): 2 g via INTRAVENOUS
  Filled 2019-07-29 (×10): qty 2

## 2019-07-29 MED ORDER — SODIUM CHLORIDE 0.9 % IV BOLUS
1000.0000 mL | Freq: Once | INTRAVENOUS | Status: AC
  Administered 2019-07-29: 1000 mL via INTRAVENOUS

## 2019-07-29 MED ORDER — SODIUM CHLORIDE 0.9 % IV SOLN
2.0000 g | Freq: Once | INTRAVENOUS | Status: AC
  Administered 2019-07-29: 2 g via INTRAVENOUS
  Filled 2019-07-29 (×2): qty 2

## 2019-07-29 MED ORDER — DM-GUAIFENESIN ER 30-600 MG PO TB12
1.0000 | ORAL_TABLET | Freq: Two times a day (BID) | ORAL | Status: DC
  Administered 2019-07-29 – 2019-08-01 (×7): 1 via ORAL
  Filled 2019-07-29 (×7): qty 1

## 2019-07-29 MED ORDER — VANCOMYCIN HCL IN DEXTROSE 1-5 GM/200ML-% IV SOLN
1000.0000 mg | Freq: Two times a day (BID) | INTRAVENOUS | Status: DC
  Administered 2019-07-30: 1000 mg via INTRAVENOUS
  Filled 2019-07-29 (×2): qty 200

## 2019-07-29 MED ORDER — ACETAMINOPHEN 325 MG PO TABS
650.0000 mg | ORAL_TABLET | Freq: Four times a day (QID) | ORAL | Status: DC | PRN
  Administered 2019-07-29: 650 mg via ORAL
  Filled 2019-07-29: qty 2

## 2019-07-29 MED ORDER — INSULIN ASPART 100 UNIT/ML ~~LOC~~ SOLN
0.0000 [IU] | Freq: Every day | SUBCUTANEOUS | Status: DC
  Administered 2019-07-30 – 2019-07-31 (×2): 2 [IU] via SUBCUTANEOUS
  Filled 2019-07-29 (×2): qty 1

## 2019-07-29 MED ORDER — SODIUM CHLORIDE 0.9 % IV BOLUS
500.0000 mL | Freq: Once | INTRAVENOUS | Status: AC
  Administered 2019-07-29: 500 mL via INTRAVENOUS

## 2019-07-29 MED ORDER — METRONIDAZOLE IN NACL 5-0.79 MG/ML-% IV SOLN
500.0000 mg | Freq: Once | INTRAVENOUS | Status: AC
  Administered 2019-07-29: 500 mg via INTRAVENOUS
  Filled 2019-07-29: qty 100

## 2019-07-29 MED ORDER — ALBUTEROL SULFATE HFA 108 (90 BASE) MCG/ACT IN AERS
2.0000 | INHALATION_SPRAY | RESPIRATORY_TRACT | Status: DC | PRN
  Administered 2019-07-29 – 2019-07-31 (×3): 2 via RESPIRATORY_TRACT
  Filled 2019-07-29 (×2): qty 6.7

## 2019-07-29 NOTE — ED Provider Notes (Signed)
[facility] Emergency Department Provider Note  Time seen: 10:18 AM  I have reviewed the triage vital signs and the nursing notes.   HISTORY  Chief Complaint Fever and Shortness of Breath   HPI Edward Atkinson is a 59 y.o. male with a past medical history of diabetes, lymphoma currently on chemotherapy presents to the emergency department for fever cough and shortness of breath.  According to the patient since yesterday he has been experiencing a fever as high as 103, was prescribed Levaquin, continued to have a fever today describes mild shortness of breath with mild cough.  Patient states he tested positive on February 11 for COVID-19 but has since recovered.  Denies any vomiting or diarrhea does state some nausea today.  Denies any dysuria or dark urine.  No chest pain or abdominal pain.   Past Medical History:  Diagnosis Date  . Diabetes mellitus without complication ([place])   . Diverticulitis   . Follicular lymphoma of intra-abdominal lymph nodes ([place]) 02/15/2019    Patient Active Problem List   Diagnosis Date Noted  . History of DVT (deep vein thrombosis) 07/06/2019  . Transaminitis 07/06/2019  . History of pulmonary embolism 07/06/2019  . Encounter for antineoplastic chemotherapy 04/29/2019  . Pulmonary embolus ([place]) 04/29/2019  . Follicular lymphoma of intra-abdominal lymph nodes ([place]) 02/15/2019  . Goals of care, counseling/discussion 02/15/2019  . DVT (deep venous thrombosis) ([place]) 02/12/2019  . Prostatitis 02/07/2019  . 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[place]) 02/06/2019  . Other pulmonary embolism with acute cor pulmonale ([place]) 02/06/2019  . Type 2 diabetes mellitus without complication, without long-term current use of insulin ([place]) 02/06/2019  . Massive pulmonary hemorrhage originating in perinatal period 01/29/2019    Past Surgical History:  Procedure Laterality Date  . CHOLECYSTECTOMY    . COLON RESECTION    . PORTA CATH INSERTION N/A  03/11/2019   Procedure: PORTA CATH INSERTION;  Surgeon: Algernon Huxley, MD;  Location: [place] [facility];  Service: Cardiovascular;  Laterality: N/A;  . PULMONARY THROMBECTOMY N/A 01/29/2019   Procedure: PULMONARY THROMBECTOMY;  Surgeon: Algernon Huxley, MD;  Location: [place] [facility];  Service: Cardiovascular;  Laterality: N/A;    Prior to Admission medications   Medication Sig Start Date End Date Taking? Authorizing Provider  apixaban (ELIQUIS) 5 MG TABS tablet Take 1 tablet (5 mg total) by mouth 2 (two) times daily. 02/07/19   Kris Hartmann, NP  dextromethorphan-guaiFENesin (MUCINEX DM) 30-600 MG 12hr tablet Take 1 tablet by mouth 2 (two) times daily as needed for cough.    [provider]  glimepiride (AMARYL) 4 MG tablet Take 4 mg by mouth daily with breakfast.    [provider]  levofloxacin (LEVAQUIN) 500 MG tablet Take 1 tablet (500 mg total) by mouth daily for 7 days. 07/28/19 08/04/19  Sindy Guadeloupe, MD  lidocaine-prilocaine (EMLA) cream Apply 1 application topically as needed. 03/04/19   Earlie Server, MD  prochlorperazine (COMPAZINE) 10 MG tablet Take 1 tablet (10 mg total) by mouth every 6 (six) hours as needed (Nausea or vomiting). 02/15/19   Earlie Server, MD    Allergies  Allergen Reactions  . Penicillins Rash    Family History  Problem Relation Age of Onset  . Diabetes Brother     Social History Social History   Tobacco Use  . Smoking status: Never Smoker  . Smokeless tobacco: Never Used  Substance Use Topics  . Alcohol use: Yes    Alcohol/week:  0.0 - 2.0 standard drinks    Comment: occasional  . Drug use: Never    Review of Systems Constitutional: Positive for fever to 103 Cardiovascular: Negative for chest pain. Respiratory: Mild shortness of breath.  Occasional cough. Gastrointestinal: Negative for abdominal pain.  Positive for nausea.  Negative for vomiting. Genitourinary: Negative for urinary compaints Musculoskeletal: Negative for  musculoskeletal complaints Neurological: Negative for headache All other ROS negative  ____________________________________________   PHYSICAL EXAM:  VITAL SIGNS: ED Triage Vitals  Enc Vitals Group     BP 07/29/19 1009 (!) 145/76     Pulse Rate 07/29/19 1009 (!) 59     Resp 07/29/19 1009 (!) 27     Temp 07/29/19 1009 (!) 103.3 F (39.6 C)     Temp Source 07/29/19 1009 Oral     SpO2 07/29/19 1009 97 %     Weight 07/29/19 0958 204 lb (92.5 kg)     Height 07/29/19 0958 6' (1.829 m)     Head Circumference --      Peak Flow --      Pain Score 07/29/19 0957 0     Pain Loc --      Pain Edu? --      Excl. in [place]? --    Constitutional: Alert and oriented. Well appearing and in no distress. Eyes: Normal exam ENT      Head: Normocephalic and atraumatic.      Mouth/Throat: Mucous membranes are moist. Cardiovascular: Regular rhythm rate around 120 bpm.  No obvious murmur. Respiratory: Normal respiratory effort without tachypnea nor retractions. Breath sounds are clear, without wheeze rales rhonchi. Gastrointestinal: Soft and nontender. No distention.   Musculoskeletal: Nontender with normal range of motion in all extremities.  Neurologic:  Normal speech and language. No gross focal neurologic deficits Skin:  Skin is warm, dry and intact.  Psychiatric: Mood and affect are normal.   ____________________________________________    EKG  EKG viewed and interpreted by myself shows sinus tachycardia 128 bpm with a narrow QRS, normal axis, largely normal intervals, patient is in a bigeminy rhythm.  No concerning ST changes noted  ____________________________________________    RADIOLOGY  Chest x-ray shows no consolidation.  ____________________________________________   INITIAL IMPRESSION / ASSESSMENT AND PLAN / ED COURSE  Pertinent labs & imaging results that were available during my care of the patient were reviewed by me and considered in my medical decision making (see chart  for details).   Patient presents emergency department for fever.  Patient is currently on chemotherapy.  Differential would include neutropenic fever, pneumonia, less likely COVID-19 given prior infection 6 weeks ago, UTI, bacteremia.  We will check labs, blood cultures, start broad-spectrum antibiotics.  We will admit to the hospital service once the patient's ER work-up is been completed.  Patient agreeable to plan of care.  On cardiac monitoring patient's heart rate remains around 120 bpm, monitor is occasionally registering approximately half of this due to the bigeminy rhythm.  Patient's work-up is largely nonrevealing white blood cell count of 3.5 does not appear to be neutropenic at this time.  Troponin negative lactate normal.  Chemistry shows no significant findings.  Chest x-ray negative.  Urinalysis appears negative.  However given the patient's significant fever tachycardia tachypnea meeting sepsis we will admit to the hospital service for further work-up.  Edward Atkinson was evaluated in Emergency Department on 07/29/2019 for the symptoms described in the history of present illness. He was evaluated in the context of the global  COVID-19 pandemic, which necessitated consideration that the patient might be at risk for infection with the SARS-CoV-2 virus that causes COVID-19. Institutional protocols and algorithms that pertain to the evaluation of patients at risk for COVID-19 are in a state of rapid change based on information released by regulatory bodies including the CDC and federal and state organizations. These policies and algorithms were followed during the patient's care in the ED.  CRITICAL CARE Performed by: Harvest Dark   Total critical care time: 30 minutes  Critical care time was exclusive of separately billable procedures and treating other patients.  Critical care was necessary to treat or prevent imminent or life-threatening deterioration.  Critical care was time  spent personally by me on the following activities: development of treatment plan with patient and/or surrogate as well as nursing, discussions with consultants, evaluation of patient's response to treatment, examination of patient, obtaining history from patient or surrogate, ordering and performing treatments and interventions, ordering and review of laboratory studies, ordering and review of radiographic studies, pulse oximetry and re-evaluation of patient's condition.  ____________________________________________   FINAL CLINICAL IMPRESSION(S) / ED DIAGNOSES  Sepsis   Harvest Dark, MD 07/29/19 1238

## 2019-07-29 NOTE — ED Notes (Signed)
Olivia rn floor nurse states send pt to the floor now.

## 2019-07-29 NOTE — Telephone Encounter (Signed)
Patient brother called reporting that patient collapsed last evening and that he has a fever of 103 and is short of breath. He request we call patient asap. I discussed with Lorretta Harp, NP who advises to go to ER. I attempted to call patient and got message that voice mail is full. I returned call to Chip and advised that I could not reach patient and that he should go to ER. He stated, OK thank you

## 2019-07-29 NOTE — ED Notes (Signed)
Resumed care from mary rn   Pt alert  Iv meds infusing.  Family with pt.  Pt waiting for admission bed.  Pt alert.

## 2019-07-29 NOTE — H&P (Signed)
History and Physical    Edward Atkinson [id number] DOB: 1960/11/16 DOA: 07/29/2019  Referring MD/NP/PA:   PCP: Rusty Aus, MD   Patient coming from:  The patient is coming from home.  At baseline, pt is independent for most of ADL.        Chief Complaint: Fever, chills, shortness breath  HPI: Edward Atkinson is a 59 y.o. male with medical history significant of B-cell lymphoma on chemotherapy, diabetes mellitus, diverticulitis, PE/DVT on Eliquis, elevated right hemidiaphragm, COVID-19 infection 6 weeks ago, who presents with fever, chills, shortness of breath.  Patient states that he has been having fever, chills, shortness breath for more than 2 days.  His shortness breath has been progressively worsening.  He has mild dry cough, but no chest pain.  Denies nausea, vomiting, diarrhea, abdominal pain, symptoms of UTI or unilateral weakness.  He states that his last chemotherapy was on 3/18.  ED Course: pt was found to have WBC 3.5, platelet 110, lactic acid 1.2, INR 1.2, PTT 39, pending COVID-19 PCR, electrolytes renal function okay, temperature 103.3, blood pressure 145/76, heart rate 59, oxygen saturation 97% on room air. Patient is admitted to [place] bed as inpatient.  # CXR showed: 1. Marked eventration/elevation of the right hemidiaphragm with overlying vascular crowding and atelectasis. 2. Minimal streaky left basilar atelectasis.  Review of Systems:   General: has fevers, chills, no body weight gain, has poor appetite, has fatigue HEENT: no blurry vision, hearing changes or sore throat Respiratory: has dyspnea, coughing, no wheezing CV: no chest pain, no palpitations GI: no nausea, vomiting, abdominal pain, diarrhea, constipation GU: no dysuria, burning on urination, increased urinary frequency, hematuria  Ext: no leg edema Neuro: no unilateral weakness, numbness, or tingling, no vision change or hearing loss Skin: no rash, no skin tear. MSK: No muscle spasm, no  deformity, no limitation of range of movement in spin Heme: No easy bruising.  Travel history: No recent long distant travel.  Allergy:  Allergies  Allergen Reactions  . Penicillins Rash    Did it involve swelling of the face/tongue/throat, SOB, or low BP? No Did it involve sudden or severe rash/hives, skin peeling, or any reaction on the inside of your mouth or nose? No Did you need to seek medical attention at a hospital or doctor's office? No When did it last happen?Unknown If all above answers are "NO", may proceed with cephalosporin use.    Past Medical History:  Diagnosis Date  . Diabetes mellitus without complication ([place])   . Diverticulitis   . Follicular lymphoma of intra-abdominal lymph nodes ([place]) 02/15/2019    Past Surgical History:  Procedure Laterality Date  . CHOLECYSTECTOMY    . COLON RESECTION    . PORTA CATH INSERTION N/A 03/11/2019   Procedure: PORTA CATH INSERTION;  Surgeon: Algernon Huxley, MD;  Location: [facility];  Service: Cardiovascular;  Laterality: N/A;  . PULMONARY THROMBECTOMY N/A 01/29/2019   Procedure: PULMONARY THROMBECTOMY;  Surgeon: Algernon Huxley, MD;  Location: [facility];  Service: Cardiovascular;  Laterality: N/A;    Social History:  reports that he has never smoked. He has never used smokeless tobacco. He reports current alcohol use. He reports that he does not use drugs.  Family History:  Family History  Problem Relation Age of Onset  . Diabetes Brother      Prior to Admission medications   Medication Sig Start Date End Date Taking? Authorizing Provider  ADVAIR DISKUS 100-50 MCG/DOSE AEPB  Inhale 1 puff into the lungs 2 (two) times daily. 07/26/19  Yes [provider]  apixaban (ELIQUIS) 5 MG TABS tablet Take 1 tablet (5 mg total) by mouth 2 (two) times daily. 02/07/19  Yes Kris Hartmann, NP  dextromethorphan-guaiFENesin (MUCINEX DM) 30-600 MG 12hr tablet Take 1 tablet by mouth 2 (two) times daily as  needed for cough.   Yes [provider]  glimepiride (AMARYL) 4 MG tablet Take 4 mg by mouth daily with breakfast.   Yes [provider]  levofloxacin (LEVAQUIN) 500 MG tablet Take 1 tablet (500 mg total) by mouth daily for 7 days. 07/28/19 08/04/19 Yes Sindy Guadeloupe, MD  lidocaine-prilocaine (EMLA) cream Apply 1 application topically as needed. 03/04/19  Yes Earlie Server, MD  prochlorperazine (COMPAZINE) 10 MG tablet Take 1 tablet (10 mg total) by mouth every 6 (six) hours as needed (Nausea or vomiting). 02/15/19  Yes Earlie Server, MD    Physical Exam: Vitals:   07/29/19 1536 07/29/19 1545 07/29/19 1630 07/29/19 1645  BP:  (!) 125/109 131/74   Pulse:  97 93 96  Resp:      Temp: (!) 100.8 F (38.2 C)     TempSrc: Oral     SpO2:  96% 96% 96%  Weight:      Height:       General: Not in acute distress HEENT:       Eyes: PERRL, EOMI, no scleral icterus.       ENT: No discharge from the ears and nose, no pharynx injection, no tonsillar enlargement.        Neck: No JVD, no bruit, no mass felt. Heme: No neck lymph node enlargement. Cardiac: S1/S2, RRR, No murmurs, No gallops or rubs. Respiratory: Has diffused coarse breathing sound bilaterally GI: Soft, nondistended, nontender, no rebound pain, no organomegaly, BS present. GU: No hematuria Ext: No pitting leg edema bilaterally. 2+DP/PT pulse bilaterally. Musculoskeletal: No joint deformities, No joint redness or warmth, no limitation of ROM in spin. Skin: No rashes.  Neuro: Alert, oriented X3, cranial nerves II-XII grossly intact, moves all extremities normally.  Psych: Patient is not psychotic, no suicidal or hemocidal ideation.  Labs on Admission: I have personally reviewed following labs and imaging studies  CBC: Recent Labs  Lab 07/29/19 1103  WBC 3.5*  NEUTROABS 3.1  HGB 12.9*  HCT 37.5*  MCV 87.4  PLT [id number]*   Basic Metabolic Panel: Recent Labs  Lab 07/29/19 1103  NA 135  K 3.8  CL 103  CO2 23  GLUCOSE  132*  BUN 14  CREATININE 1.11  CALCIUM 8.4*   GFR: Estimated Creatinine Clearance: 76.7 mL/min (by C-G formula based on SCr of 1.11 mg/dL). Liver Function Tests: Recent Labs  Lab 07/29/19 1103  AST 24  ALT 22  ALKPHOS 51  BILITOT 0.5  PROT 6.6  ALBUMIN 3.5   No results for input(s): LIPASE, AMYLASE in the last 168 hours. No results for input(s): AMMONIA in the last 168 hours. Coagulation Profile: Recent Labs  Lab 07/29/19 1103  INR 1.2   Cardiac Enzymes: No results for input(s): CKTOTAL, CKMB, CKMBINDEX, TROPONINI in the last 168 hours. BNP (last 3 results) No results for input(s): PROBNP in the last 8760 hours. HbA1C: No results for input(s): HGBA1C in the last 72 hours. CBG: No results for input(s): GLUCAP in the last 168 hours. Lipid Profile: No results for input(s): CHOL, HDL, LDLCALC, TRIG, CHOLHDL, LDLDIRECT in the last 72 hours. Thyroid Function Tests: No results  for input(s): TSH, T4TOTAL, FREET4, T3FREE, THYROIDAB in the last 72 hours. Anemia Panel: No results for input(s): VITAMINB12, FOLATE, FERRITIN, TIBC, IRON, RETICCTPCT in the last 72 hours. Urine analysis:    Component Value Date/Time   COLORURINE YELLOW (A) 07/29/2019 1103   APPEARANCEUR CLEAR (A) 07/29/2019 1103   LABSPEC 1.019 07/29/2019 1103   PHURINE 6.0 07/29/2019 1103   GLUCOSEU NEGATIVE 07/29/2019 1103   HGBUR NEGATIVE 07/29/2019 1103   BILIRUBINUR NEGATIVE 07/29/2019 1103   KETONESUR NEGATIVE 07/29/2019 1103   PROTEINUR 30 (A) 07/29/2019 1103   NITRITE NEGATIVE 07/29/2019 1103   LEUKOCYTESUR NEGATIVE 07/29/2019 1103   Sepsis Labs: @LABRCNTIP (procalcitonin:4,lacticidven:4) )No results found for this or any previous visit (from the past 240 hour(s)).   Radiological Exams on Admission: DG Chest Port 1 View  Result Date: 07/29/2019 CLINICAL DATA:  Fever and chills. Shortness of breath for 2 days. EXAM: PORTABLE CHEST 1 VIEW COMPARISON:  01/29/2019 chest x-ray and chest CT 07/25/2019  FINDINGS: Right IJ power port in good position without complicating features. Stable cardiac silhouette, mediastinal and hilar contours. Marked eventration/elevation of the right hemidiaphragm with overlying vascular crowding and atelectasis. Minimal streaky left basilar atelectasis. No definite infiltrates or effusions. No pneumothorax. The bony thorax is intact. IMPRESSION: 1. Marked eventration/elevation of the right hemidiaphragm with overlying vascular crowding and atelectasis. 2. Minimal streaky left basilar atelectasis. Electronically Signed   By: Marijo Sanes M.D.   On: 07/29/2019 11:18     EKG: Independently reviewed.  Sinus rhythm, QTC 385, PVC bigeminy, LAD  Assessment/Plan Principal Problem:   Sepsis ([place]) Active Problems:   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HCC)   Type 2 diabetes mellitus without complication, without long-term current use of insulin (HCC)   Pulmonary embolus (HCC)   History of DVT (deep vein thrombosis)   Thrombocytopenia (HCC)   Sepsis ([place]): due to possible acute bronchitis versus early stage of HCAP.  Patient has cough, shortness of breath. Lung auscultation with diffuse bilateral coarse breathing sound.  Patient meets criteria for sepsis with fever, tachypnea, WBC less than 4.  Currently hemodynamically stable  - Will admit to med-surg bed as inpt - IV Vancomycin and cefepime (patient received 1 dose of aztreonam and Flagyl in ED) - Mucinex for cough  - Bronchodilators - Urine legionella and S. pneumococcal antigen - Follow up blood culture x2, sputum culture - will get Procalcitonin and trend lactic acid level per sepsis protocol - IVF: 2.5L of NS bolus in ED, followed by 75 mL per hour of NS  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[place]) -consulted Dr. Earlie Server of oncology  Type 2 diabetes mellitus without complication, without long-term current use of insulin ([place]): Most recent A1c 10.5, poorly controled. Patient is taking glipizide at  home -SSI  Pulmonary embolus and History of DVT (deep vein thrombosis) -continue Eliquix  Thrombocytopenia ([place]): Likely due to chemotherapy.  Mental status normal -Check LDH and peripheral smear   Inpatient status:  # Patient requires inpatient status due to high intensity of service, high risk for further deterioration and high frequency of surveillance required.  I certify that at the point of admission it is my clinical judgment that the patient will require inpatient hospital care spanning beyond 2 midnights from the point of admission.  . This patient has multiple chronic comorbidities including B-cell lymphoma on chemotherapy, diabetes mellitus, diverticulitis, PE/DVT on Eliquis, elevated right hemidiaphragm, COVID-19 infection 6 weeks ago . Now patient has presenting with sepsis due to possible acute bronchitis versus  early stage of pneumonia . The worrisome physical exam findings include diffused coarse breathing sound bilaterally . The initial radiographic and laboratory data are worrisome because of marked elevation of right diaphragm . Current medical needs: please see my assessment and plan . Predictability of an adverse outcome (risk): Patient has multiple comorbidities as listed above. Now presents with sepsis due to possible acute bronchitis versus early stage of pneumonia. Patient's presentation is highly complicated.  Patient is at high risk of deteriorating.  Will need to be treated in hospital for at least 2 days.      DVT ppx: Eliquis Code Status: Full code Family Communication:  Yes, patient's wife at bed side Disposition Plan:  Anticipate discharge back to previous home environment Consults called:  none Admission status: Med-surg bed as inpt      Date of Service 07/29/2019    [facility] [place] [facility]   If 7PM-7AM, please contact night-coverage [email or web address] 07/29/2019, 5:45 PM

## 2019-07-29 NOTE — Progress Notes (Signed)
Pharmacy Antibiotic Note  Edward Atkinson is a 59 y.o. male admitted on 07/29/2019. Pharmacy has been consulted for vancomycin and aztreonam dosing.  Patient with penicillin allergy listed however all questions answered "no." Discussed with MD, will change aztreonam to cefepime.  Plan: Vancomycin 2000 mg IV x 1 followed by vancomycin 1000 mg IV q12h Goal AUC 400-550 Expected AUC: 441 SCr used: 1.11  Cefepime 2 g IV q8h to start from last dose of aztreonam   Height: 6' (182.9 cm) Weight: 204 lb (92.5 kg) IBW/kg (Calculated) : 77.6  Temp (24hrs), Avg:103.3 F (39.6 C), Min:103.3 F (39.6 C), Max:103.3 F (39.6 C)  Recent Labs  Lab 07/29/19 1103  WBC 3.5*  CREATININE 1.11  LATICACIDVEN 1.2    Estimated Creatinine Clearance: 76.7 mL/min (by C-G formula based on SCr of 1.11 mg/dL).    Allergies  Allergen Reactions  . Penicillins Rash    Did it involve swelling of the face/tongue/throat, SOB, or low BP? No Did it involve sudden or severe rash/hives, skin peeling, or any reaction on the inside of your mouth or nose? No Did you need to seek medical attention at a hospital or doctor's office? No When did it last happen?Unknown If all above answers are "NO", may proceed with cephalosporin use.    Antimicrobials this admission: Aztreonam 3/29 x 1 Vanc 3/29 >> Cefepime 3/29 >>  Dose adjustments this admission: NA  Microbiology results: 3/29 BCx: pending 3/29 UCx: pending  3/29 MRSA PCR: ordered  Thank you for allowing pharmacy to be a part of this patient's care.  Tawnya Crook, PharmD 07/29/2019 1:04 PM

## 2019-07-29 NOTE — Telephone Encounter (Signed)
Patient called on call physician yesterday with fever of 103 and was prescribed antibiotic.    Called to see how patient was feeling today.  He still has fever 102, chills, extreme weakness with a collapse last night while going to restroom.  Also believes his chronic difficulty breathing is worse.  Dr. Tasia Catchings updated with patient condition and she recommends him go to ER for evaluation.  Patient agrees and will to to ER.

## 2019-07-29 NOTE — ED Notes (Signed)
Family with pt  Pt on his laptop computer.  Pt waiting on admission bed.

## 2019-07-29 NOTE — Telephone Encounter (Signed)
I talked with the patient this morning and he was advised to go to ER.

## 2019-07-29 NOTE — Progress Notes (Addendum)
Notified bedside nurse of need to administer antibiotics and fluid bolus.  Messaged bedside to address any barriers preventing the start of abx, bedside RN replies back waiting on azactam from pharmacy

## 2019-07-29 NOTE — Progress Notes (Signed)
PHARMACY -  BRIEF ANTIBIOTIC NOTE   Pharmacy has received consult(s) for Vancomycin and Aztreonam from an ED provider.  The patient's profile has been reviewed for ht/wt/allergies/indication/available labs.    One time order(s) placed for Vancomycin and Aztreonam by ED provider  Further antibiotics/pharmacy consults should be ordered by admitting physician if indicated.                       Thank you, Vira Blanco 07/29/2019  10:29 AM

## 2019-07-29 NOTE — ED Notes (Signed)
Dinner tray given to pt

## 2019-07-29 NOTE — ED Triage Notes (Signed)
Pt c/o feeling SOB with a fever and chills for the past 2 days, pt is tachypenic in triage. Pt is currently getting chemo for lymphoma, last treatment 3/18-3/19

## 2019-07-29 NOTE — Telephone Encounter (Signed)
Noted.  Agree with ER evaluation

## 2019-07-30 DIAGNOSIS — I2782 Chronic pulmonary embolism: Secondary | ICD-10-CM

## 2019-07-30 DIAGNOSIS — R509 Fever, unspecified: Secondary | ICD-10-CM

## 2019-07-30 DIAGNOSIS — R0602 Shortness of breath: Secondary | ICD-10-CM

## 2019-07-30 DIAGNOSIS — Z880 Allergy status to penicillin: Secondary | ICD-10-CM

## 2019-07-30 DIAGNOSIS — A419 Sepsis, unspecified organism: Secondary | ICD-10-CM

## 2019-07-30 DIAGNOSIS — Z79899 Other long term (current) drug therapy: Secondary | ICD-10-CM

## 2019-07-30 DIAGNOSIS — Z86718 Personal history of other venous thrombosis and embolism: Secondary | ICD-10-CM

## 2019-07-30 DIAGNOSIS — Z7901 Long term (current) use of anticoagulants: Secondary | ICD-10-CM

## 2019-07-30 DIAGNOSIS — C8293 Follicular lymphoma, unspecified, intra-abdominal lymph nodes: Secondary | ICD-10-CM

## 2019-07-30 DIAGNOSIS — C8280 Other types of follicular lymphoma, unspecified site: Secondary | ICD-10-CM

## 2019-07-30 DIAGNOSIS — E119 Type 2 diabetes mellitus without complications: Secondary | ICD-10-CM

## 2019-07-30 DIAGNOSIS — D72810 Lymphocytopenia: Secondary | ICD-10-CM

## 2019-07-30 DIAGNOSIS — Z86711 Personal history of pulmonary embolism: Secondary | ICD-10-CM

## 2019-07-30 DIAGNOSIS — D696 Thrombocytopenia, unspecified: Secondary | ICD-10-CM

## 2019-07-30 DIAGNOSIS — Z8616 Personal history of COVID-19: Secondary | ICD-10-CM

## 2019-07-30 LAB — LACTATE DEHYDROGENASE: LDH: 189 U/L (ref 98–192)

## 2019-07-30 LAB — RESPIRATORY PANEL BY PCR

## 2019-07-30 LAB — ABO/RH: ABO/RH(D): B POS

## 2019-07-30 LAB — CBC WITH DIFFERENTIAL/PLATELET
Abs Immature Granulocytes: 0.01 10*3/uL (ref 0.00–0.07)
Basophils Absolute: 0 10*3/uL (ref 0.0–0.1)
Basophils Relative: 0 %
Eosinophils Absolute: 0 10*3/uL (ref 0.0–0.5)
Eosinophils Relative: 0 %
HCT: 33.8 % — ABNORMAL LOW (ref 39.0–52.0)
Hemoglobin: 11.6 g/dL — ABNORMAL LOW (ref 13.0–17.0)
Immature Granulocytes: 0 %
Lymphocytes Relative: 3 %
Lymphs Abs: 0.1 10*3/uL — ABNORMAL LOW (ref 0.7–4.0)
MCH: 30.5 pg (ref 26.0–34.0)
MCHC: 34.3 g/dL (ref 30.0–36.0)
MCV: 88.9 fL (ref 80.0–100.0)
Monocytes Absolute: 0.4 10*3/uL (ref 0.1–1.0)
Monocytes Relative: 13 %
Neutro Abs: 2.3 10*3/uL (ref 1.7–7.7)
Neutrophils Relative %: 84 %
Platelets: 103 10*3/uL — ABNORMAL LOW (ref 150–400)
RBC: 3.8 MIL/uL — ABNORMAL LOW (ref 4.22–5.81)
RDW: 12 % (ref 11.5–15.5)
WBC: 2.8 10*3/uL — ABNORMAL LOW (ref 4.0–10.5)
nRBC: 0 % (ref 0.0–0.2)

## 2019-07-30 LAB — CBC
HCT: 33.7 % — ABNORMAL LOW (ref 39.0–52.0)
Hemoglobin: 11.5 g/dL — ABNORMAL LOW (ref 13.0–17.0)
MCH: 29.9 pg (ref 26.0–34.0)
MCHC: 34.1 g/dL (ref 30.0–36.0)
MCV: 87.8 fL (ref 80.0–100.0)
Platelets: 102 10*3/uL — ABNORMAL LOW (ref 150–400)
RBC: 3.84 MIL/uL — ABNORMAL LOW (ref 4.22–5.81)
RDW: 11.9 % (ref 11.5–15.5)
WBC: 2.7 10*3/uL — ABNORMAL LOW (ref 4.0–10.5)
nRBC: 0 % (ref 0.0–0.2)

## 2019-07-30 LAB — BASIC METABOLIC PANEL
Anion gap: 9 (ref 5–15)
BUN: 10 mg/dL (ref 8–23)
CO2: 22 mmol/L (ref 22–32)
Calcium: 8.4 mg/dL — ABNORMAL LOW (ref 8.9–10.3)
Chloride: 106 mmol/L (ref 98–111)
Creatinine, Ser: 0.97 mg/dL (ref 0.61–1.24)
GFR calc Af Amer: >60 mL/min (ref >60)
GFR calc non Af Amer: >60 mL/min (ref >60)
Glucose, Bld: 113 mg/dL — ABNORMAL HIGH (ref 70–99)
Potassium: 3.6 mmol/L (ref 3.5–5.1)
Sodium: 137 mmol/L (ref 135–145)

## 2019-07-30 LAB — GLUCOSE, CAPILLARY
Glucose-Capillary: 100 mg/dL — ABNORMAL HIGH (ref 70–99)
Glucose-Capillary: 103 mg/dL — ABNORMAL HIGH (ref 70–99)
Glucose-Capillary: 94 mg/dL (ref 70–99)
Glucose-Capillary: 256 mg/dL — ABNORMAL HIGH (ref 70–99)
Glucose-Capillary: 240 mg/dL — ABNORMAL HIGH (ref 70–99)

## 2019-07-30 LAB — C-REACTIVE PROTEIN: CRP: 2.3 mg/dL — ABNORMAL HIGH (ref <1.0)

## 2019-07-30 LAB — PROCALCITONIN: Procalcitonin: <0.1 ng/mL

## 2019-07-30 LAB — MRSA PCR SCREENING: MRSA by PCR: NEGATIVE

## 2019-07-30 LAB — FIBRIN DERIVATIVES D-DIMER (ARMC ONLY): Fibrin derivatives D-dimer (ARMC): 905.61 ng/mL (FEU) — ABNORMAL HIGH (ref 0.00–499.00)

## 2019-07-30 LAB — RESPIRATORY PANEL BY RT PCR (FLU A&B, COVID)
Influenza A by PCR: NEGATIVE
Influenza B by PCR: NEGATIVE
SARS Coronavirus 2 by RT PCR: POSITIVE — AB

## 2019-07-30 LAB — FIBRINOGEN: Fibrinogen: 664 mg/dL — ABNORMAL HIGH (ref 210–475)

## 2019-07-30 LAB — EXPECTORATED SPUTUM ASSESSMENT W GRAM STAIN, RFLX TO RESP C

## 2019-07-30 LAB — FERRITIN: Ferritin: 712 ng/mL — ABNORMAL HIGH (ref 24–336)

## 2019-07-30 LAB — URINE CULTURE: Culture: NO GROWTH

## 2019-07-30 LAB — PATHOLOGIST SMEAR REVIEW

## 2019-07-30 LAB — BRAIN NATRIURETIC PEPTIDE: B Natriuretic Peptide: 40 pg/mL (ref 0.0–100.0)

## 2019-07-30 LAB — STREP PNEUMONIAE URINARY ANTIGEN: Strep Pneumo Urinary Antigen: NEGATIVE

## 2019-07-30 MED ORDER — ASCORBIC ACID 500 MG PO TABS
500.0000 mg | ORAL_TABLET | Freq: Every day | ORAL | Status: DC
  Administered 2019-07-30 – 2019-08-01 (×3): 500 mg via ORAL
  Filled 2019-07-30 (×3): qty 1

## 2019-07-30 MED ORDER — HYDROCOD POLST-CPM POLST ER 10-8 MG/5ML PO SUER: 5.0000 mL | Freq: Two times a day (BID) | ORAL | Status: DC | PRN

## 2019-07-30 MED ORDER — VANCOMYCIN HCL 1250 MG/250ML IV SOLN
1250.0000 mg | Freq: Two times a day (BID) | INTRAVENOUS | Status: DC
  Administered 2019-07-30 – 2019-08-01 (×4): 1250 mg via INTRAVENOUS
  Filled 2019-07-30 (×5): qty 250

## 2019-07-30 MED ORDER — GUAIFENESIN-DM 100-10 MG/5ML PO SYRP: 10.0000 mL | ORAL_SOLUTION | ORAL | Status: DC | PRN

## 2019-07-30 MED ORDER — DEXAMETHASONE SODIUM PHOSPHATE 10 MG/ML IJ SOLN
6.0000 mg | INTRAMUSCULAR | Status: DC
  Administered 2019-07-30 – 2019-08-01 (×3): 6 mg via INTRAVENOUS
  Filled 2019-07-30 (×3): qty 1

## 2019-07-30 MED ORDER — SODIUM CHLORIDE 0.9 % IV SOLN
100.0000 mg | Freq: Every day | INTRAVENOUS | Status: DC
  Administered 2019-07-31 – 2019-08-01 (×2): 100 mg via INTRAVENOUS
  Filled 2019-07-30: qty 20
  Filled 2019-07-30: qty 100

## 2019-07-30 MED ORDER — ZINC SULFATE 220 (50 ZN) MG PO CAPS
220.0000 mg | ORAL_CAPSULE | Freq: Every day | ORAL | Status: DC
  Administered 2019-07-30 – 2019-08-01 (×3): 220 mg via ORAL
  Filled 2019-07-30 (×4): qty 1

## 2019-07-30 MED ORDER — CHLORHEXIDINE GLUCONATE CLOTH 2 % EX PADS
6.0000 | MEDICATED_PAD | Freq: Every day | CUTANEOUS | Status: DC
  Administered 2019-07-30 – 2019-07-31 (×2): 6 via TOPICAL

## 2019-07-30 MED ORDER — SODIUM CHLORIDE 0.9 % IV SOLN
200.0000 mg | Freq: Once | INTRAVENOUS | Status: AC
  Administered 2019-07-30: 200 mg via INTRAVENOUS
  Filled 2019-07-30: qty 200

## 2019-07-30 MED ORDER — IPRATROPIUM-ALBUTEROL 20-100 MCG/ACT IN AERS
1.0000 | INHALATION_SPRAY | Freq: Four times a day (QID) | RESPIRATORY_TRACT | Status: DC
  Administered 2019-07-30 – 2019-08-01 (×8): 1 via RESPIRATORY_TRACT
  Filled 2019-07-30: qty 4

## 2019-07-30 NOTE — Consult Note (Signed)
Remdesivir - Pharmacy Brief Note   O:  ALT: 22 CXR: Minimal streaky left basilar atelectasis SpO2: 93% on RA   A/P:  Remdesivir 200 mg IVPB once followed by 100 mg IVPB daily x 4 days.   Pearla Dubonnet, PharmD Clinical Pharmacist 07/30/2019 8:05 AM

## 2019-07-30 NOTE — Consult Note (Signed)
NAME: Edward Atkinson  DOB: 09-30-1960  MRN: [id number]  Date/Time: 07/30/2019 11:45 AM  REQUESTING PROVIDER;  Subjective:  REASON FOR CONSULT:  ? Edward Atkinson is a 59 y.o. male with a history of PE follicular lymphoma on chemo is admitted with increasing shortness of breath, cough and fever. As per patient he had a COVID test on Feb 11th as his wife was symptomatic with covid- His test also came back positive but he was totally asymptomatic. He received his 5th round of chemo for follicular lymphoma on 9/57. 3/19 ( rituximab and bendamustine) A few days after that he started feeling SOB and went to his PCP on 07/25/19 who ordered a CTA as patient was concerned that it could be PE.   He had PE in sept   2020 and felt the same. At that time he underwent thrombectomy and Thrombolysis on 01/29/19.A Ct scan done then showed abdominal mass and he had biopsy on 01/31/19  was diagnosed as lymphoma. On 10//22 he had bone marrow biopsy, Chemo started on 02/28/19  11/9 /20 a PORT was placed    The CTA done on 3/25 did not show any new PE. Pt continued to be sob and was also coughing and had a fever of 103 . His brother  called his oncologist office on 3/29 and they asked him to g to the ED. Pt apparently had collapsed when he went to the bathroom In the ED his temp was 103.3. Rapid antigen test for corona was negative and PCR was sent . He was started on IV antibiotics for sepsis. Rt PCR  returned positive and I am asked to see patient  Pt has no diarrhea He denies having such symptoms during his previous 4 cycles of chemo    Past Medical History:  Diagnosis Date  . Diabetes mellitus without complication ([place])   . Diverticulitis   . Follicular lymphoma of intra-abdominal lymph nodes ([place]) 02/15/2019    Past Surgical History:  Procedure Laterality Date  . CHOLECYSTECTOMY    . COLON RESECTION    . PORTA CATH INSERTION N/A 03/11/2019   Procedure: PORTA CATH INSERTION;  Surgeon: Algernon Huxley, MD;   Location: [facility];  Service: Cardiovascular;  Laterality: N/A;  . PULMONARY THROMBECTOMY N/A 01/29/2019   Procedure: PULMONARY THROMBECTOMY;  Surgeon: Algernon Huxley, MD;  Location: [facility];  Service: Cardiovascular;  Laterality: N/A;    Social History   Socioeconomic History  . Marital status: Married    Spouse name: Not on file  . Number of children: Not on file  . Years of education: Not on file  . Highest education level: Not on file  Occupational History  . Not on file  Tobacco Use  . Smoking status: Never Smoker  . Smokeless tobacco: Never Used  Substance and Sexual Activity  . Alcohol use: Yes    Alcohol/week: 0.0 - 2.0 standard drinks    Comment: occasional  . Drug use: Never  . Sexual activity: Yes  Other Topics Concern  . Not on file  Social History Narrative  . Not on file   Social Determinants of Health   Financial Resource Strain:   . Difficulty of Paying Living Expenses:   Food Insecurity:   . Worried About Charity fundraiser in the Last Year:   . Arboriculturist in the Last Year:   Transportation Needs:   . Film/video editor (Medical):   Marland Kitchen Lack of Transportation (  Non-Medical):   Physical Activity:   . Days of Exercise per Week:   . Minutes of Exercise per Session:   Stress:   . Feeling of Stress :   Social Connections:   . Frequency of Communication with Friends and Family:   . Frequency of Social Gatherings with Friends and Family:   . Attends Religious Services:   . Active Member of Clubs or Organizations:   . Attends Archivist Meetings:   Marland Kitchen Marital Status:   Intimate Partner Violence:   . Fear of Current or Ex-Partner:   . Emotionally Abused:   Marland Kitchen Physically Abused:   . Sexually Abused:     Family History  Problem Relation Age of Onset  . Diabetes Brother    Allergies  Allergen Reactions  . Penicillins Rash    Did it involve swelling of the face/tongue/throat, SOB, or low BP? No Did it involve  sudden or severe rash/hives, skin peeling, or any reaction on the inside of your mouth or nose? No Did you need to seek medical attention at a hospital or doctor's office? No When did it last happen?Unknown If all above answers are "NO", may proceed with cephalosporin use.  ? Current Facility-Administered Medications  Medication Dose Route Frequency Provider Last Rate Last Admin  . 0.9 %  sodium chloride infusion   Intravenous Continuous Ivor Costa, MD 75 mL/hr at 07/30/19 0700 New Bag at 07/30/19 0700  . acetaminophen (TYLENOL) tablet 1,000 mg  1,000 mg Oral Once Ivor Costa, MD      . acetaminophen (TYLENOL) tablet 650 mg  650 mg Oral Q6H PRN Ivor Costa, MD   650 mg at 07/29/19 1334  . albuterol (VENTOLIN HFA) 108 (90 Base) MCG/ACT inhaler 2 puff  2 puff Inhalation Q4H PRN Ivor Costa, MD   2 puff at 07/29/19 1452  . apixaban (ELIQUIS) tablet 5 mg  5 mg Oral BID Ivor Costa, MD   5 mg at 07/30/19 1135  . ascorbic acid (VITAMIN C) tablet 500 mg  500 mg Oral Daily Lorella Nimrod, MD   500 mg at 07/30/19 1135  . ceFEPIme (MAXIPIME) 2 g in sodium chloride 0.9 % 100 mL IVPB  2 g Intravenous Q8H Ivor Costa, MD 200 mL/hr at 07/30/19 0623 2 g at 07/30/19 5053  . chlorpheniramine-HYDROcodone (TUSSIONEX) 10-8 MG/5ML suspension 5 mL  5 mL Oral Q12H PRN Lorella Nimrod, MD      . dexamethasone (DECADRON) injection 6 mg  6 mg Intravenous Q24H Lorella Nimrod, MD   6 mg at 07/30/19 1136  . dextromethorphan-guaiFENesin (MUCINEX DM) 30-600 MG per 12 hr tablet 1 tablet  1 tablet Oral BID Ivor Costa, MD   1 tablet at 07/30/19 1135  . guaiFENesin-dextromethorphan (ROBITUSSIN DM) 100-10 MG/5ML syrup 10 mL  10 mL Oral Q4H PRN Lorella Nimrod, MD      . insulin aspart (novoLOG) injection 0-5 Units  0-5 Units Subcutaneous QHS Ivor Costa, MD      . insulin aspart (novoLOG) injection 0-9 Units  0-9 Units Subcutaneous TID WC Ivor Costa, MD      . Ipratropium-Albuterol (COMBIVENT) respimat 1 puff  1 puff Inhalation Q6H Lorella Nimrod, MD      . mometasone-formoterol (DULERA) 100-5 MCG/ACT inhaler 2 puff  2 puff Inhalation BID Ivor Costa, MD   2 puff at 07/29/19 2139  . ondansetron (ZOFRAN) injection 4 mg  4 mg Intravenous Q8H PRN Ivor Costa, MD      . remdesivir 200 mg in  sodium chloride 0.9% 250 mL IVPB  200 mg Intravenous Once Lorella Nimrod, MD       Followed by  . [START ON 07/31/2019] remdesivir 100 mg in sodium chloride 0.9 % 100 mL IVPB  100 mg Intravenous Daily Lorella Nimrod, MD      . vancomycin (VANCOCIN) IVPB 1000 mg/200 mL premix  1,000 mg Intravenous Q12H Ivor Costa, MD   Stopped at 07/30/19 0406  . zinc sulfate capsule 220 mg  220 mg Oral Daily Lorella Nimrod, MD         Abtx:  Anti-infectives (From admission, onward)   Start     Dose/Rate Route Frequency Ordered Stop   07/31/19 1000  remdesivir 100 mg in sodium chloride 0.9 % 100 mL IVPB     100 mg 200 mL/hr over 30 Minutes Intravenous Daily 07/30/19 0800 08/04/19 0959   07/30/19 0900  remdesivir 200 mg in sodium chloride 0.9% 250 mL IVPB     200 mg 580 mL/hr over 30 Minutes Intravenous Once 07/30/19 0800     07/30/19 0400  vancomycin (VANCOCIN) IVPB 1000 mg/200 mL premix     1,000 mg 200 mL/hr over 60 Minutes Intravenous Every 12 hours 07/29/19 1352     07/29/19 2000  ceFEPIme (MAXIPIME) 2 g in sodium chloride 0.9 % 100 mL IVPB     2 g 200 mL/hr over 30 Minutes Intravenous Every 8 hours 07/29/19 1352     07/29/19 1400  vancomycin (VANCOCIN) IVPB 1000 mg/200 mL premix     1,000 mg 200 mL/hr over 60 Minutes Intravenous  Once 07/29/19 1250 07/29/19 1842   07/29/19 1015  aztreonam (AZACTAM) 2 g in sodium chloride 0.9 % 100 mL IVPB     2 g 200 mL/hr over 30 Minutes Intravenous  Once 07/29/19 1012 07/29/19 1335   07/29/19 1015  metroNIDAZOLE (FLAGYL) IVPB 500 mg     500 mg 100 mL/hr over 60 Minutes Intravenous  Once 07/29/19 1012 07/29/19 1453   07/29/19 1015  vancomycin (VANCOCIN) IVPB 1000 mg/200 mL premix     1,000 mg 200 mL/hr over 60  Minutes Intravenous  Once 07/29/19 1012 07/29/19 1343      REVIEW OF SYSTEMS:  Const:  fever,  chills, negative weight loss Eyes: negative diplopia or visual changes, negative eye pain ENT:  coryza, negative sore throat Resp:  cough, , dyspnea Cards: negative for chest pain, palpitations, lower extremity edema GU: negative for frequency, dysuria and hematuria GI: Negative for abdominal pain, diarrhea, bleeding, constipation Skin: negative for rash and pruritus Heme: negative for easy bruising and gum/nose bleeding MS: generalized weakness Neurolo:negative for headaches, positive dizziness, no vertigo, memory problems  Psych: negative for feelings of anxiety, depression  Endocrine: negative for thyroid, diabetes Allergy/Immunology- penicillin- rash as a child- has taken amoxicillin  Objective:  VITALS:  BP 126/77 (BP Location: Right Arm)   Pulse 100   Temp 99.7 F (37.6 C) (Oral)   Resp 18   Ht 6' (1.829 m)   Wt 92.5 kg   SpO2 93%   BMI 27.67 kg/m  PHYSICAL EXAM:  General: Alert, cooperative, some resp  distress, appears stated age.  Head: Normocephalic, without obvious abnormality, atraumatic. Eyes: Conjunctivae clear, anicteric sclerae. Pupils are equal ENT Nares normal. No drainage or sinus tenderness. Lips, mucosa, and tongue normal. No Thrush Neck: Supple, symmetrical, no adenopathy, thyroid: non tender no carotid bruit and no JVD. Chest wall- rt side port Back: No CVA tenderness. Lungs: b/l air entry Heart: Regular  rate and rhythm, no murmur, rub or gallop. Abdomen: Soft, non-tender,not distended. Bowel sounds normal. No masses Extremities: atraumatic, no cyanosis. No edema. No clubbing Skin: No rashes or lesions. Or bruising Lymph: Cervical, supraclavicular normal. Neurologic: Grossly non-focal Pertinent Labs Lab Results CBC    Component Value Date/Time   WBC 2.7 (L) 07/30/2019 0445   WBC 2.8 (L) 07/30/2019 0445   RBC 3.84 (L) 07/30/2019 0445   RBC 3.80  (L) 07/30/2019 0445   HGB 11.5 (L) 07/30/2019 0445   HGB 11.6 (L) 07/30/2019 0445   HCT 33.7 (L) 07/30/2019 0445   HCT 33.8 (L) 07/30/2019 0445   PLT 102 (L) 07/30/2019 0445   PLT 103 (L) 07/30/2019 0445   MCV 87.8 07/30/2019 0445   MCV 88.9 07/30/2019 0445   MCH 29.9 07/30/2019 0445   MCH 30.5 07/30/2019 0445   MCHC 34.1 07/30/2019 0445   MCHC 34.3 07/30/2019 0445   RDW 11.9 07/30/2019 0445   RDW 12.0 07/30/2019 0445   LYMPHSABS 0.1 (L) 07/30/2019 0445   MONOABS 0.4 07/30/2019 0445   EOSABS 0.0 07/30/2019 0445   BASOSABS 0.0 07/30/2019 0445    CMP Latest Ref Rng & Units 07/30/2019 07/29/2019 07/18/2019  Glucose 70 - 99 mg/dL 113(H) 132(H) 118(H)  BUN 8 - 23 mg/dL '10 14 15  ' Creatinine 0.61 - 1.24 mg/dL 0.97 1.11 1.02  Sodium 135 - 145 mmol/L 137 135 134(L)  Potassium 3.5 - 5.1 mmol/L 3.6 3.8 4.0  Chloride 98 - 111 mmol/L 106 103 103  CO2 22 - 32 mmol/L '22 23 23  ' Calcium 8.9 - 10.3 mg/dL 8.4(L) 8.4(L) 8.8(L)  Total Protein 6.5 - 8.1 g/dL - 6.6 7.0  Total Bilirubin 0.3 - 1.2 mg/dL - 0.5 0.8  Alkaline Phos 38 - 126 U/L - 51 73  AST 15 - 41 U/L - 24 28  ALT 0 - 44 U/L - 22 45(H)      Microbiology: Recent Results (from the past 240 hour(s))  Blood Culture (routine x 2)     Status: None (Preliminary result)   Collection Time: 07/29/19 11:03 AM   Specimen: BLOOD  Result Value Ref Range Status   Specimen Description BLOOD PORT  Final   Special Requests   Final    BOTTLES DRAWN AEROBIC AND ANAEROBIC Blood Culture adequate volume   Culture   Final    NO GROWTH < 24 HOURS Performed at [facility], [address] [place], [place] [address]    Report Status PENDING  Incomplete  Blood Culture (routine x 2)     Status: None (Preliminary result)   Collection Time: 07/29/19 11:03 AM   Specimen: BLOOD  Result Value Ref Range Status   Specimen Description BLOOD PORT  Final   Special Requests   Final    BOTTLES DRAWN AEROBIC AND ANAEROBIC Blood Culture adequate volume    Culture   Final    NO GROWTH < 24 HOURS Performed at [facility], [address] [place], [place] [address]    Report Status PENDING  Incomplete  SARS CORONAVIRUS 2 (TAT 6-24 HRS) Nasopharyngeal Nasopharyngeal Swab     Status: Abnormal   Collection Time: 07/29/19 12:15 PM   Specimen: Nasopharyngeal Swab  Result Value Ref Range Status   SARS Coronavirus 2 POSITIVE (A) NEGATIVE Final    Comment: RESULT CALLED TO, READ BACK BY AND VERIFIED WITH: K.OLIVER RN 1925 07/29/19 MCCORMICK K (NOTE) SARS-CoV-2 target nucleic acids are DETECTED. The SARS-CoV-2 RNA is generally detectable in  upper and lower respiratory specimens during the acute phase of infection. Positive results are indicative of the presence of SARS-CoV-2 RNA. Clinical correlation with patient history and other diagnostic information is  necessary to determine patient infection status. Positive results do not rule out bacterial infection or co-infection with other viruses.  The expected result is Negative. Fact Sheet for Patients: SugarRoll.be Fact Sheet for Healthcare Providers: https://www.woods-mathews.com/ This test is not yet approved or cleared by the [place] FDA and  has been authorized for detection and/or diagnosis of SARS-CoV-2 by FDA under an Emergency Use Authorization (EUA). This EUA will remain  in effect (meaning this test can be used) for t he duration of the COVID-19 declaration under Section 564(b)(1) of the Act, 21 U.S.C. section 360bbb-3(b)(1), unless the authorization is terminated or revoked sooner. Performed at [facility], [place] [address], [place], [place] [address]   Culture, sputum-assessment     Status: None   Collection Time: 07/30/19  8:01 AM   Specimen: Expectorated Sputum  Result Value Ref Range Status   Specimen Description EXPECTORATED SPUTUM  Final   Special Requests NONE  Final   Sputum evaluation   Final    Sputum  specimen not acceptable for testing.  Please recollect.   REQUEST FOR RECOLLECT CALLED TO JACK DOUGHERTY AT 5883 ON 07/30/2019 [place]. Performed at [facility], [address] [place], [place] [address]    Report Status 07/30/2019 FINAL  Final  MRSA PCR Screening     Status: None   Collection Time: 07/30/19  8:23 AM   Specimen: Nasopharyngeal  Result Value Ref Range Status   MRSA by PCR NEGATIVE NEGATIVE Final    Comment:        The GeneXpert MRSA Assay (FDA approved for NASAL specimens only), is one component of a comprehensive MRSA colonization surveillance program. It is not intended to diagnose MRSA infection nor to guide or monitor treatment for MRSA infections. Performed at [facility], [address] [place], [place] [address]     IMAGING RESULTS:  I have personally reviewed the films ? Impression/Recommendation ?59 y.o. male with a history of PE follicular lymphoma on chemo is admitted with increasing shortness of breath, cough and fever.  ?SOB and fever- confusin g picture He had positive covid tst feb 11th when he was totally asymptomatic . Now he has resp symptms and also fever and Rt PCR is positive. PCR remains poitive for many weeks but what is confusing is that his ct value is 28 which indicates somewhat of an acute infection. I was expecting the ct value to be more than 34 as he is 6 weeks past a positive test. It could be that chemo may have resulted in prolonged covid infection and recent symptoms D.D PE but CT negative Other causes for fever could be chemo related-  There is leucopenia but not neutropenia Could it be related to sid eeffect of chemo?? He has a OR and concern for vascular infection Will treat for COVID and agree with remdisivir  Currently on broad spectrum antibiotics- follow blood culture and de-escalate accordingly   Follicular lymphoma diagnosed Oct 2020- on rutuximab and bendamustine  Unprovoked PE in Sept 2020-  had thrombectomy and thrombolysis and on eliquis   ? ___________________________________________________ Discussed with patient, requesting provider

## 2019-07-30 NOTE — Progress Notes (Signed)
PROGRESS NOTE    Edward Atkinson  ZXY:[id number] DOB: March 14, 1961 DOA: 07/29/2019 PCP: Rusty Aus, MD   Brief Narrative:   Edward Atkinson is a 59 y.o. male with medical history significant of B-cell lymphoma on chemotherapy, diabetes mellitus, diverticulitis, PE/DVT on Eliquis, elevated right hemidiaphragm, COVID-19 infection 6 weeks ago, who presents with fever, chills, shortness of breath.  Patient states that he has been having fever, chills, shortness breath for more than 2 days.  His shortness breath has been progressively worsening.  He has mild dry cough, but no chest pain.  Denies nausea, vomiting, diarrhea, abdominal pain, symptoms of UTI or unilateral weakness.  He states that his last chemotherapy was on 3/18.  Patient has prior positive rapid Covid test at med center on 2/11 with no documentation and remained completely asymptomatic.  Covid PCR came back positive, elevated inflammatory markers.  ID was consulted and we are trying to figure out recent/old infection.  Started on remdesivir, Decadron with broad-spectrum antibiotics and will be narrowed down once more data is available.  Subjective: Patient continued to feel some shortness of breath.  Now saturating on room air in low 90s.  Patient remained completely asymptomatic during prior diagnosis at [facility].  At that time his wife was positive and she was symptomatic.  Patient did not had any trouble with prior chemotherapies.  Last chemotherapy was 3/18.  Assessment & Plan:   Principal Problem:   Sepsis ([place]) Active Problems:   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HCC)   Type 2 diabetes mellitus without complication, without long-term current use of insulin (HCC)   Pulmonary embolus (HCC)   History of DVT (deep vein thrombosis)   Thrombocytopenia (HCC)   Lymphocytopenia  Sepsis [facility]): Patient met initial sepsis criteria with fever, tachypnea, tachycardia and hypoxia.  Currently hemodynamically stable.  He is Covid  positive on PCR.  Patient has leukopenia and lymphopenia along with thrombopenia.  Patient with history of B-cell lymphoma being on chemotherapy makes his case quite complicated.  ID was consulted and we are trying to figure out whether this current Covid is a recent versus old infection.  Elevated inflammatory markers, be due to Covid or his underlying malignancy. -Treat him with remdesivir and Decadron for now. -Continue broad-spectrum antibiotics due to possibility of leukopenic fever as patient is immunocompromised. -Monitor fever curve. -Monitor inflammatory markers. -Follow-up blood and sputum cultures.  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HCC) -consulted Dr. Earlie Server of oncology-appreciate their recommendations.  Type 2 diabetes mellitus without complication, without long-term current use of insulin ([place]): Most recent A1c 10.5, poorly controled. Patient is taking glipizide at home. -Managed with basal and SSI as patient is on steroid now.  Pulmonary embolus and History of DVT (deep vein thrombosis): Repeat CTA on 3/25 was without any new PE. -Home dose of Eliquis.  Thrombocytopenia ([place]):  Elevated LDH.  Can be multifactorial secondary to chemo and recent infection. -Continue to monitor.  Objective: Vitals:   07/29/19 1645 07/29/19 1836 07/29/19 2230 07/30/19 0304  BP:  118/78 138/72 126/77  Pulse: 96 93 96 100  Resp:  _0 Temp:  98.9 F (37.2 C) 98.6 F (37 C) 99.7 F (37.6 C)  TempSrc:  Oral  Oral  SpO2: 96% 95% 96% 93%  Weight:      Height:        Intake/Output Summary (Last 24 hours) at 07/30/2019 1449 Last data filed at 07/30/2019 1341 Gross per 24 hour  Intake 2046.57 ml  Output 1225 ml  Net 821.57 ml   Filed Weights   07/29/19 0958  Weight: 92.5 kg    Examination:  General exam: Appears calm and comfortable  Respiratory system: Clear to auscultation. Respiratory effort normal. Cardiovascular system: S1 & S2 heard, RRR. No JVD, murmurs, rubs,  gallops or clicks. Gastrointestinal system: Soft, nontender, nondistended, bowel sounds positive. Central nervous system: Alert and oriented. No focal neurological deficits.Symmetric 5 x 5 power. Extremities: No edema, no cyanosis, pulses intact and symmetrical. Skin: No rashes, lesions or ulcers Psychiatry: Judgement and insight appear normal. Mood & affect appropriate.    DVT prophylaxis: Eliquis Code Status: Full Family Communication: Patient was updated Disposition Plan: Pending improvement.  Patient will go back home.  Consultants:   ID  Oncology  Procedures:  Antimicrobials:  Cefepime Vancomycin  Data Reviewed: I have personally reviewed following labs and imaging studies  CBC: Recent Labs  Lab 07/29/19 1103 07/30/19 0445  WBC 3.5* 2.8*  2.7*  NEUTROABS 3.1 2.3  HGB 12.9* 11.6*  11.5*  HCT 37.5* 33.8*  33.7*  MCV 87.4 88.9  87.8  PLT 110* 103*  195*   Basic Metabolic Panel: Recent Labs  Lab 07/29/19 1103 07/30/19 0445  NA 135 137  K 3.8 3.6  CL 103 106  CO2 23 22  GLUCOSE 132* 113*  BUN 14 10  CREATININE 1.11 0.97  CALCIUM 8.4* 8.4*   GFR: Estimated Creatinine Clearance: 87.8 mL/min (by C-G formula based on SCr of 0.97 mg/dL). Liver Function Tests: Recent Labs  Lab 07/29/19 1103  AST 24  ALT 22  ALKPHOS 51  BILITOT 0.5  PROT 6.6  ALBUMIN 3.5   No results for input(s): LIPASE, AMYLASE in the last 168 hours. No results for input(s): AMMONIA in the last 168 hours. Coagulation Profile: Recent Labs  Lab 07/29/19 1103  INR 1.2   Cardiac Enzymes: No results for input(s): CKTOTAL, CKMB, CKMBINDEX, TROPONINI in the last 168 hours. BNP (last 3 results) No results for input(s): PROBNP in the last 8760 hours. HbA1C: No results for input(s): HGBA1C in the last 72 hours. CBG: Recent Labs  Lab 07/29/19 2145 07/30/19 0306 07/30/19 0733 07/30/19 1124  GLUCAP 69* 100* 103* 94   Lipid Profile: No results for input(s): CHOL, HDL,  LDLCALC, TRIG, CHOLHDL, LDLDIRECT in the last 72 hours. Thyroid Function Tests: No results for input(s): TSH, T4TOTAL, FREET4, T3FREE, THYROIDAB in the last 72 hours. Anemia Panel: Recent Labs    07/30/19 0445  FERRITIN 712*   Sepsis Labs: Recent Labs  Lab 07/29/19 1103 07/30/19 0445  PROCALCITON  --  <0.10  LATICACIDVEN 1.2  --     Recent Results (from the past 240 hour(s))  Blood Culture (routine x 2)     Status: None (Preliminary result)   Collection Time: 07/29/19 11:03 AM   Specimen: BLOOD  Result Value Ref Range Status   Specimen Description BLOOD PORT  Final   Special Requests   Final    BOTTLES DRAWN AEROBIC AND ANAEROBIC Blood Culture adequate volume   Culture   Final    NO GROWTH < 24 HOURS Performed at [facility], [address] [place], [place] [address]    Report Status PENDING  Incomplete  Blood Culture (routine x 2)     Status: None (Preliminary result)   Collection Time: 07/29/19 11:03 AM   Specimen: BLOOD  Result Value Ref Range Status   Specimen Description BLOOD PORT  Final   Special Requests   Final  BOTTLES DRAWN AEROBIC AND ANAEROBIC Blood Culture adequate volume   Culture   Final    NO GROWTH < 24 HOURS Performed at [facility], [address] [place], [place] [address]    Report Status PENDING  Incomplete  Urine culture     Status: None   Collection Time: 07/29/19 11:03 AM   Specimen: In/Out Cath Urine  Result Value Ref Range Status   Specimen Description   Final    IN/OUT CATH URINE Performed at [facility], [address] [place], [place] [address]    Special Requests   Final    NONE Performed at [facility], [address], [place], [place] [address]    Culture   Final    NO GROWTH Performed at [facility], [place] [address], [place], [place] [address]    Report Status 07/30/2019 FINAL  Final  SARS CORONAVIRUS 2 (TAT 6-24 HRS) Nasopharyngeal Nasopharyngeal Swab     Status:  Abnormal   Collection Time: 07/29/19 12:15 PM   Specimen: Nasopharyngeal Swab  Result Value Ref Range Status   SARS Coronavirus 2 POSITIVE (A) NEGATIVE Final    Comment: RESULT CALLED TO, READ BACK BY AND VERIFIED WITH: K.OLIVER RN 1925 07/29/19 MCCORMICK K (NOTE) SARS-CoV-2 target nucleic acids are DETECTED. The SARS-CoV-2 RNA is generally detectable in upper and lower respiratory specimens during the acute phase of infection. Positive results are indicative of the presence of SARS-CoV-2 RNA. Clinical correlation with patient history and other diagnostic information is  necessary to determine patient infection status. Positive results do not rule out bacterial infection or co-infection with other viruses.  The expected result is Negative. Fact Sheet for Patients: SugarRoll.be Fact Sheet for Healthcare Providers: https://www.woods-mathews.com/ This test is not yet approved or cleared by the [place] FDA and  has been authorized for detection and/or diagnosis of SARS-CoV-2 by FDA under an Emergency Use Authorization (EUA). This EUA will remain  in effect (meaning this test can be used) for t he duration of the COVID-19 declaration under Section 564(b)(1) of the Act, 21 U.S.C. section 360bbb-3(b)(1), unless the authorization is terminated or revoked sooner. Performed at [facility], [place] [address] [place],  [address]   Respiratory Panel by PCR     Status: None   Collection Time: 07/29/19 12:15 PM   Specimen: Nasopharyngeal Swab; Respiratory  Result Value Ref Range Status   Adenovirus NOT DETECTED NOT DETECTED Final   Coronavirus 229E NOT DETECTED NOT DETECTED Final    Comment: (NOTE) The Coronavirus on the Respiratory Panel, DOES NOT test for the novel  Coronavirus (2019 nCoV)    Coronavirus HKU1 NOT DETECTED NOT DETECTED Final   Coronavirus NL63 NOT DETECTED NOT DETECTED Final   Coronavirus OC43 NOT DETECTED NOT  DETECTED Final   Metapneumovirus NOT DETECTED NOT DETECTED Final   Rhinovirus / Enterovirus NOT DETECTED NOT DETECTED Final   Influenza A NOT DETECTED NOT DETECTED Final   Influenza B NOT DETECTED NOT DETECTED Final   Parainfluenza Virus 1 NOT DETECTED NOT DETECTED Final   Parainfluenza Virus 2 NOT DETECTED NOT DETECTED Final   Parainfluenza Virus 3 NOT DETECTED NOT DETECTED Final   Parainfluenza Virus 4 NOT DETECTED NOT DETECTED Final   Respiratory Syncytial Virus NOT DETECTED NOT DETECTED Final   Bordetella pertussis NOT DETECTED NOT DETECTED Final   Chlamydophila pneumoniae NOT DETECTED NOT DETECTED Final   Mycoplasma pneumoniae NOT DETECTED NOT DETECTED Final    Comment: Performed at  [facility], [place] [address] [place], [place] [address]  Respiratory Panel by RT PCR (Flu A&B, Covid) - Nasopharyngeal Swab     Status: Abnormal   Collection Time: 07/29/19 12:15 PM   Specimen: Nasopharyngeal Swab  Result Value Ref Range Status   SARS Coronavirus 2 by RT PCR POSITIVE (A) NEGATIVE Final    Comment: CRITICAL VALUE NOTED.  VALUE IS CONSISTENT WITH PREVIOUSLY REPORTED AND CALLED VALUE. (NOTE) SARS-CoV-2 target nucleic acids are DETECTED. SARS-CoV-2 RNA is generally detectable in upper respiratory specimens  during the acute phase of infection. Positive results are indicative of the presence of the identified virus, but do not rule out bacterial infection or co-infection with other pathogens not detected by the test. Clinical correlation with patient history and other diagnostic information is necessary to determine patient infection status. The expected result is Negative. Fact Sheet for Patients:  PinkCheek.be Fact Sheet for Healthcare Providers: GravelBags.it This test is not yet approved or cleared by the [place] FDA and  has been authorized for detection and/or diagnosis of SARS-CoV-2 by FDA under an Emergency  Use Authorization (EUA).  This EUA will remain in effect (meaning this test can be used) fo r the duration of  the COVID-19 declaration under Section 564(b)(1) of the Act, 21 U.S.C. section 360bbb-3(b)(1), unless the authorization is terminated or revoked sooner.    Influenza A by PCR NEGATIVE NEGATIVE Final   Influenza B by PCR NEGATIVE NEGATIVE Final    Comment: (NOTE) The Xpert Xpress SARS-CoV-2/FLU/RSV assay is intended as an aid in  the diagnosis of influenza from Nasopharyngeal swab specimens and  should not be used as a sole basis for treatment. Nasal washings and  aspirates are unacceptable for Xpert Xpress SARS-CoV-2/FLU/RSV  testing. Fact Sheet for Patients: PinkCheek.be Fact Sheet for Healthcare Providers: GravelBags.it This test is not yet approved or cleared by the [place] FDA and  has been authorized for detection and/or diagnosis of SARS-CoV-2 by  FDA under an Emergency Use Authorization (EUA). This EUA will remain  in effect (meaning this test can be used) for the duration of the  Covid-19 declaration under Section 564(b)(1) of the Act, 21  U.S.C. section 360bbb-3(b)(1), unless the authorization is  terminated or revoked. Performed at [facility], [place] [address], [place], [place] [address]   Culture, sputum-assessment     Status: None   Collection Time: 07/30/19  8:01 AM   Specimen: Expectorated Sputum  Result Value Ref Range Status   Specimen Description EXPECTORATED SPUTUM  Final   Special Requests NONE  Final   Sputum evaluation   Final    Sputum specimen not acceptable for testing.  Please recollect.   REQUEST FOR RECOLLECT CALLED TO JACK DOUGHERTY AT 0981 ON 07/30/2019 [place]. Performed at [facility], [address] [place], [place] [address]    Report Status 07/30/2019 FINAL  Final  MRSA PCR Screening     Status: None   Collection Time: 07/30/19  8:23 AM   Specimen:  Nasopharyngeal  Result Value Ref Range Status   MRSA by PCR NEGATIVE NEGATIVE Final    Comment:        The GeneXpert MRSA Assay (FDA approved for NASAL specimens only), is one component of a comprehensive MRSA colonization surveillance program. It is not intended to diagnose MRSA infection nor to guide or monitor treatment for MRSA infections. Performed at [facility], [address], [place], [place] [address]      Radiology  Studies: DG Chest Port 1 View  Result Date: 07/29/2019 CLINICAL DATA:  Fever and chills. Shortness of breath for 2 days. EXAM: PORTABLE CHEST 1 VIEW COMPARISON:  01/29/2019 chest x-ray and chest CT 07/25/2019 FINDINGS: Right IJ power port in good position without complicating features. Stable cardiac silhouette, mediastinal and hilar contours. Marked eventration/elevation of the right hemidiaphragm with overlying vascular crowding and atelectasis. Minimal streaky left basilar atelectasis. No definite infiltrates or effusions. No pneumothorax. The bony thorax is intact. IMPRESSION: 1. Marked eventration/elevation of the right hemidiaphragm with overlying vascular crowding and atelectasis. 2. Minimal streaky left basilar atelectasis. Electronically Signed   By: Marijo Sanes M.D.   On: 07/29/2019 11:18    Scheduled Meds: . acetaminophen  1,000 mg Oral Once  . apixaban  5 mg Oral BID  . vitamin C  500 mg Oral Daily  . Chlorhexidine Gluconate Cloth  6 each Topical Daily  . dexamethasone (DECADRON) injection  6 mg Intravenous Q24H  . dextromethorphan-guaiFENesin  1 tablet Oral BID  . insulin aspart  0-5 Units Subcutaneous QHS  . insulin aspart  0-9 Units Subcutaneous TID WC  . Ipratropium-Albuterol  1 puff Inhalation Q6H  . mometasone-formoterol  2 puff Inhalation BID  . zinc sulfate  220 mg Oral Daily   Continuous Infusions: . sodium chloride 75 mL/hr at 07/30/19 1341  . ceFEPime (MAXIPIME) IV 200 mL/hr at 07/30/19 1341  . [START ON 07/31/2019]  remdesivir 100 mg in NS 100 mL    . vancomycin       LOS: 1 day   Time spent: 50 minutes.  Lorella Nimrod, MD [facility]  If 7PM-7AM, please contact night-coverage [email or web address]  07/30/2019, 2:49 PM   This record has been created using Systems analyst. Errors have been sought and corrected,but may not always be located. Such creation errors do not reflect on the standard of care.

## 2019-07-30 NOTE — Consult Note (Signed)
Hematology/Oncology Consult note [name] Telephone:([phone] [phone]  Patient Care Team: Rusty Aus, MD as PCP - General (Internal Medicine)   Name of the patient: Edward Atkinson  [id number]  1960-09-12   Date of visit: 07/30/19 REASON FOR COSULTATION:  Sepsis, history of follicular lymphoma History of presenting illness-  59 y.o. male with PMH listed at below who presents to ER for evaluation of fever, chills, shortness of breath.  Patient called on-call physician at the cancer center over the weekend, was advised to start taking Levaquin.  Her symptoms did not get better and was advised to go to emergency room for additional work-up and management.    Patient has history of follicular cell lymphoma, status post 5 cycles of bendamustine and rituximab.  Last treatment 07/18/2019.  Patient previously tolerated treatment very well.  Cycle 5 was delayed due to initially Covid infection and later delayed due to transaminitis which was considered to be likely secondary to recent COVID-19 infection.  Patient reports that cough is mostly dry, scant mucus production.  Also reports fatigue and decreased appetite.  Shortness of breath, denies any chest pain, palpitation, abdominal pain.  No nausea vomiting diarrhea.  Lactic acid 1.2, WBC 3.5, platelet 1 10,000. Temperature was 103.3 in the ER.  Hemodynamically stable.  Patient was admitted. Chest x-ray showed marked elevation of the right hemidiaphragm with overlying vascular crowding and atelectasis.  Minimally streaky left basilar atelectasis.    Review of Systems  Constitutional: Positive for chills, fatigue and fever. Negative for appetite change and unexpected weight change.  HENT:   Negative for hearing loss and voice change.   Eyes: Negative for eye problems and icterus.  Respiratory: Positive for cough and shortness of breath. Negative for chest tightness and hemoptysis.   Cardiovascular:  Negative for chest pain and leg swelling.  Gastrointestinal: Negative for abdominal distention and abdominal pain.  Endocrine: Negative for hot flashes.  Genitourinary: Negative for difficulty urinating, dysuria and frequency.   Musculoskeletal: Negative for arthralgias.  Skin: Negative for itching and rash.  Neurological: Negative for light-headedness and numbness.  Hematological: Negative for adenopathy. Does not bruise/bleed easily.  Psychiatric/Behavioral: Negative for confusion.    Allergies  Allergen Reactions  . Penicillins Rash    Did it involve swelling of the face/tongue/throat, SOB, or low BP? No Did it involve sudden or severe rash/hives, skin peeling, or any reaction on the inside of your mouth or nose? No Did you need to seek medical attention at a hospital or doctor's office? No When did it last happen?Unknown If all above answers are "NO", may proceed with cephalosporin use.    Patient Active Problem List   Diagnosis Date Noted  . Sepsis ([place]) 07/29/2019  . Thrombocytopenia ([place]) 07/29/2019  . History of DVT (deep vein thrombosis) 07/06/2019  . Transaminitis 07/06/2019  . History of pulmonary embolism 07/06/2019  . Encounter for antineoplastic chemotherapy 04/29/2019  . Pulmonary embolus ([place]) 04/29/2019  . Follicular lymphoma of intra-abdominal lymph nodes ([place]) 02/15/2019  . Goals of care, counseling/discussion 02/15/2019  . DVT (deep venous thrombosis) ([place]) 02/12/2019  . Prostatitis 02/07/2019  . 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[place]) 02/06/2019  . Other pulmonary embolism with acute cor pulmonale ([place]) 02/06/2019  . Type 2 diabetes mellitus without complication, without long-term current use of insulin ([place]) 02/06/2019  . Massive pulmonary hemorrhage originating in perinatal period 01/29/2019     Past Medical History:  Diagnosis Date  . Diabetes mellitus without complication ([place])   .  Diverticulitis   . Follicular lymphoma of intra-abdominal  lymph nodes ([place]) 02/15/2019     Past Surgical History:  Procedure Laterality Date  . CHOLECYSTECTOMY    . COLON RESECTION    . PORTA CATH INSERTION N/A 03/11/2019   Procedure: PORTA CATH INSERTION;  Surgeon: Algernon Huxley, MD;  Location: [facility];  Service: Cardiovascular;  Laterality: N/A;  . PULMONARY THROMBECTOMY N/A 01/29/2019   Procedure: PULMONARY THROMBECTOMY;  Surgeon: Algernon Huxley, MD;  Location: [facility];  Service: Cardiovascular;  Laterality: N/A;    Social History   Socioeconomic History  . Marital status: Married    Spouse name: Not on file  . Number of children: Not on file  . Years of education: Not on file  . Highest education level: Not on file  Occupational History  . Not on file  Tobacco Use  . Smoking status: Never Smoker  . Smokeless tobacco: Never Used  Substance and Sexual Activity  . Alcohol use: Yes    Alcohol/week: 0.0 - 2.0 standard drinks    Comment: occasional  . Drug use: Never  . Sexual activity: Yes  Other Topics Concern  . Not on file  Social History Narrative  . Not on file   Social Determinants of Health   Financial Resource Strain:   . Difficulty of Paying Living Expenses:   Food Insecurity:   . Worried About Charity fundraiser in the Last Year:   . Arboriculturist in the Last Year:   Transportation Needs:   . Film/video editor (Medical):   Marland Kitchen Lack of Transportation (Non-Medical):   Physical Activity:   . Days of Exercise per Week:   . Minutes of Exercise per Session:   Stress:   . Feeling of Stress :   Social Connections:   . Frequency of Communication with Friends and Family:   . Frequency of Social Gatherings with Friends and Family:   . Attends Religious Services:   . Active Member of Clubs or Organizations:   . Attends Archivist Meetings:   Marland Kitchen Marital Status:   Intimate Partner Violence:   . Fear of Current or Ex-Partner:   . Emotionally Abused:   Marland Kitchen Physically Abused:   .  Sexually Abused:      Family History  Problem Relation Age of Onset  . Diabetes Brother      Current Facility-Administered Medications:  .  0.9 %  sodium chloride infusion, , Intravenous, Continuous, Ivor Costa, MD, Last Rate: 75 mL/hr at 07/30/19 0700, New Bag at 07/30/19 0700 .  acetaminophen (TYLENOL) tablet 1,000 mg, 1,000 mg, Oral, Once, Ivor Costa, MD .  acetaminophen (TYLENOL) tablet 650 mg, 650 mg, Oral, Q6H PRN, Ivor Costa, MD, 650 mg at 07/29/19 1334 .  albuterol (VENTOLIN HFA) 108 (90 Base) MCG/ACT inhaler 2 puff, 2 puff, Inhalation, Q4H PRN, Ivor Costa, MD, 2 puff at 07/29/19 1452 .  apixaban (ELIQUIS) tablet 5 mg, 5 mg, Oral, BID, Ivor Costa, MD, 5 mg at 07/29/19 2138 .  ascorbic acid (VITAMIN C) tablet 500 mg, 500 mg, Oral, Daily, Amin, Sumayya, MD .  ceFEPIme (MAXIPIME) 2 g in sodium chloride 0.9 % 100 mL IVPB, 2 g, Intravenous, Q8H, Ivor Costa, MD, Last Rate: 200 mL/hr at 07/30/19 0623, 2 g at 07/30/19 [id number] .  chlorpheniramine-HYDROcodone (TUSSIONEX) 10-8 MG/5ML suspension 5 mL, 5 mL, Oral, Q12H PRN, Lorella Nimrod, MD .  dexamethasone (DECADRON) injection 6 mg, 6 mg, Intravenous, Q24H,  Lorella Nimrod, MD .  dextromethorphan-guaiFENesin [facility] DM) 30-600 MG per 12 hr tablet 1 tablet, 1 tablet, Oral, BID, Ivor Costa, MD, 1 tablet at 07/29/19 2138 .  guaiFENesin-dextromethorphan (ROBITUSSIN DM) 100-10 MG/5ML syrup 10 mL, 10 mL, Oral, Q4H PRN, Amin, Sumayya, MD .  insulin aspart (novoLOG) injection 0-5 Units, 0-5 Units, Subcutaneous, QHS, Niu, Xilin, MD .  insulin aspart (novoLOG) injection 0-9 Units, 0-9 Units, Subcutaneous, TID WC, Ivor Costa, MD .  Ipratropium-Albuterol (COMBIVENT) respimat 1 puff, 1 puff, Inhalation, Q6H, Lorella Nimrod, MD .  mometasone-formoterol (DULERA) 100-5 MCG/ACT inhaler 2 puff, 2 puff, Inhalation, BID, Ivor Costa, MD, 2 puff at 07/29/19 2139 .  ondansetron (ZOFRAN) injection 4 mg, 4 mg, Intravenous, Q8H PRN, Ivor Costa, MD .  remdesivir 200 mg in  sodium chloride 0.9% 250 mL IVPB, 200 mg, Intravenous, Once **FOLLOWED BY** [START ON 07/31/2019] remdesivir 100 mg in sodium chloride 0.9 % 100 mL IVPB, 100 mg, Intravenous, Daily, Amin, Soundra Pilon, MD .  vancomycin (VANCOCIN) IVPB 1000 mg/200 mL premix, 1,000 mg, Intravenous, Q12H, Ivor Costa, MD, Stopped at 07/30/19 0406 .  zinc sulfate capsule 220 mg, 220 mg, Oral, Daily, Lorella Nimrod, MD   Physical exam:  Vitals:   07/29/19 1645 07/29/19 1836 07/29/19 2230 07/30/19 0304  BP:  118/78 138/72 126/77  Pulse: 96 93 96 100  Resp:  15 16 18   Temp:  98.9 F (37.2 C) 98.6 F (37 C) 99.7 F (37.6 C)  TempSrc:  Oral  Oral  SpO2: 96% 95% 96% 93%  Weight:      Height:       Physical Exam  Constitutional: He is oriented to person, place, and time. No distress.  HENT:  Head: Normocephalic and atraumatic.  Nose: Nose normal.  Mouth/Throat: Oropharynx is clear and moist. No oropharyngeal exudate.  Eyes: Pupils are equal, round, and reactive to light. EOM are normal. No scleral icterus.  Cardiovascular: Normal rate and regular rhythm.  No murmur heard. Pulmonary/Chest: Effort normal. No respiratory distress.  Crackles on left base  Abdominal: Soft. He exhibits no distension. There is no abdominal tenderness.  Musculoskeletal:        General: No edema. Normal range of motion.     Cervical back: Normal range of motion and neck supple.  Neurological: He is alert and oriented to person, place, and time. No cranial nerve deficit. He exhibits normal muscle tone. Coordination normal.  Skin: Skin is warm and dry. He is not diaphoretic. No erythema.  Psychiatric: Affect normal.        CMP Latest Ref Rng & Units 07/30/2019  Glucose 70 - 99 mg/dL 113(H)  BUN 8 - 23 mg/dL 10  Creatinine 0.61 - 1.24 mg/dL 0.97  Sodium 135 - 145 mmol/L 137  Potassium 3.5 - 5.1 mmol/L 3.6  Chloride 98 - 111 mmol/L 106  CO2 22 - 32 mmol/L 22  Calcium 8.9 - 10.3 mg/dL 8.4(L)  Total Protein 6.5 - 8.1 g/dL -  Total  Bilirubin 0.3 - 1.2 mg/dL -  Alkaline Phos 38 - 126 U/L -  AST 15 - 41 U/L -  ALT 0 - 44 U/L -   CBC Latest Ref Rng & Units 07/30/2019  WBC 4.0 - 10.5 K/uL 2.8(L)  Hemoglobin 13.0 - 17.0 g/dL 11.6(L)  Hematocrit 39.0 - 52.0 % 33.8(L)  Platelets 150 - 400 K/uL 103(L)    RADIOGRAPHIC STUDIES: I have personally reviewed the radiological images as listed and agreed with the findings in the report. CT ANGIO  CHEST PE W OR WO CONTRAST  Result Date: 07/25/2019 CLINICAL DATA:  Shortness of breath for months, history of lymphoma on chemotherapy, pulmonary embolism EXAM: CT ANGIOGRAPHY CHEST WITH CONTRAST TECHNIQUE: Multidetector CT imaging of the chest was performed using the standard protocol during bolus administration of intravenous contrast. Multiplanar CT image reconstructions and MIPs were obtained to evaluate the vascular anatomy. CONTRAST:  51mL OMNIPAQUE IOHEXOL 350 MG/ML SOLN IV COMPARISON:  01/29/2019 FINDINGS: Cardiovascular: Aorta normal caliber without aneurysm or dissection. Heart unremarkable. No pericardial effusion. Pulmonary arteries well opacified. Small linear filling defect identified in LEFT lower lobe pulmonary artery consistent with a synechia, sequela of prior embolus; patient had a large pulmonary embolus in LEFT lower lobe pulmonary artery on prior study which has otherwise resolved. No new pulmonary emboli identified. Mediastinum/Nodes: Base of cervical region normal appearance. Esophagus unremarkable. No thoracic adenopathy. Lungs/Pleura: Dependent atelectasis in BILATERAL lower lobes. Remaining lungs clear. No acute infiltrate, pleural effusion or pneumothorax. Upper Abdomen: Post cholecystectomy. Visualized upper abdomen otherwise unremarkable. Musculoskeletal: Mild osseous demineralization. No focal osseous abnormalities. Review of the MIP images confirms the above findings. IMPRESSION: Small linear filling defect in LEFT lower lobe pulmonary artery consistent with a synechia,  sequela of prior pulmonary embolism; patient had a large pulmonary embolus in LEFT lower lobe pulmonary artery on prior study which has otherwise resolved. No new pulmonary emboli identified. Dependent atelectasis in BILATERAL lower lobes. Electronically Signed   By: Lavonia Dana M.D.   On: 07/25/2019 17:12   DG Chest Port 1 View  Result Date: 07/29/2019 CLINICAL DATA:  Fever and chills. Shortness of breath for 2 days. EXAM: PORTABLE CHEST 1 VIEW COMPARISON:  01/29/2019 chest x-ray and chest CT 07/25/2019 FINDINGS: Right IJ power port in good position without complicating features. Stable cardiac silhouette, mediastinal and hilar contours. Marked eventration/elevation of the right hemidiaphragm with overlying vascular crowding and atelectasis. Minimal streaky left basilar atelectasis. No definite infiltrates or effusions. No pneumothorax. The bony thorax is intact. IMPRESSION: 1. Marked eventration/elevation of the right hemidiaphragm with overlying vascular crowding and atelectasis. 2. Minimal streaky left basilar atelectasis. Electronically Signed   By: Marijo Sanes M.D.   On: 07/29/2019 11:18    Assessment and plan- Patient is a 59 y.o. male with history of follicular cell lymphoma, PE on anticoagulation, presented with fever, chills, cough, shortness of breath.  X-ray showed marked elevation of the right hemidiaphragm with overlying vascular crowding and atelectasis.  Minimal streaky left basilar atelectasis.  #Sepsis, On empiric vancomycin and cefepime.  Patient received aztreonam and Flagyl in emergency room. Urine Legionella pending and pneumococcal antigen negative.. Blood culture no growth so far. UA negative.urine culture pending Procalcitonin is pending. Gentle IV fluid hydration.  COVID test is positive, he had COVID infection a few weeks ago.   #Follicular cell lymphoma Status post 5 cycles of bendamustine and rituximab.  Last treatment was 07/18/2019 CBC showed leukopenia,  predominantly lymphocytopenia.  This can be secondary to recent chemotherapy as well as acute infection.  He does not have chronic lymphocytopenia.  Currently he is undergoing infectious etiology work-up.  respiratory panel is pending.   ?opportunistic infections if no other etiology is find, i.e. PCP. ID has been consulted.  Appreciate input.  # Thrombocytopenia, monitor. Due to recent chemo and current sepsis.  # History of PE, continue Eliquis.   Thank you for allowing me to participate in the care of this patient.   Earlie Server, MD, PhD Hematology Oncology [facility]  Regional Pager- [phone] 07/30/2019

## 2019-07-30 NOTE — Progress Notes (Addendum)
Pharmacy Antibiotic Note  Edward Atkinson is a 59 y.o. male admitted on 07/29/2019. Pharmacy has been consulted for vancomycin and aztreonam dosing.  Patient with penicillin allergy listed however all questions answered "no." Discussed with MD, will change aztreonam to cefepime.  Plan: Will change Vancomycin 1000mg  q12h to  Vancomycin 1250mg  q12h Goal AUC 400-550. Expected AUC: 485 SCr used: 0.97  Continue Cefepime 2 g IV q8h   Height: 6' (182.9 cm) Weight: 204 lb (92.5 kg) IBW/kg (Calculated) : 77.6  Temp (24hrs), Avg:99.5 F (37.5 C), Min:98.6 F (37 C), Max:100.8 F (38.2 C)  Recent Labs  Lab 07/29/19 1103 07/30/19 0445  WBC 3.5* 2.8*  2.7*  CREATININE 1.11 0.97  LATICACIDVEN 1.2  --     Estimated Creatinine Clearance: 87.8 mL/min (by C-G formula based on SCr of 0.97 mg/dL).    Allergies  Allergen Reactions  . Penicillins Rash    Did it involve swelling of the face/tongue/throat, SOB, or low BP? No Did it involve sudden or severe rash/hives, skin peeling, or any reaction on the inside of your mouth or nose? No Did you need to seek medical attention at a hospital or doctor's office? No When did it last happen?Unknown If all above answers are "NO", may proceed with cephalosporin use.    Antimicrobials this admission: Aztreonam 3/29 x 1 Vanc 3/29 >> Cefepime 3/29 >> Remdesivir 3/30 >>  Dose adjustments this admission: NA  Microbiology results: 3/29 BCx: NGTD 3/29 UCx: NG final 3/30 Resp panel pending  3/29: COVID POSITIVE 3/29 MRSA PCR: NEG  (but will continue Vancomycin per neutropenic fever protocol)  Thank you for allowing pharmacy to be a part of this patient's care.  Lu Duffel, PharmD, BCPS Clinical Pharmacist 07/30/2019 1:57 PM

## 2019-07-31 DIAGNOSIS — D72819 Decreased white blood cell count, unspecified: Secondary | ICD-10-CM

## 2019-07-31 DIAGNOSIS — J1282 Pneumonia due to coronavirus disease 2019: Secondary | ICD-10-CM

## 2019-07-31 DIAGNOSIS — C8290 Follicular lymphoma, unspecified, unspecified site: Secondary | ICD-10-CM

## 2019-07-31 DIAGNOSIS — Z978 Presence of other specified devices: Secondary | ICD-10-CM

## 2019-07-31 DIAGNOSIS — U071 COVID-19: Secondary | ICD-10-CM

## 2019-07-31 LAB — COMPREHENSIVE METABOLIC PANEL WITH GFR
ALT: 23 U/L (ref 0–44)
AST: 25 U/L (ref 15–41)
Albumin: 3.2 g/dL — ABNORMAL LOW (ref 3.5–5.0)
Alkaline Phosphatase: 43 U/L (ref 38–126)
Anion gap: 7 (ref 5–15)
BUN: 14 mg/dL (ref 8–23)
CO2: 23 mmol/L (ref 22–32)
Calcium: 8.6 mg/dL — ABNORMAL LOW (ref 8.9–10.3)
Chloride: 107 mmol/L (ref 98–111)
Creatinine, Ser: 0.91 mg/dL (ref 0.61–1.24)
GFR calc Af Amer: >60 mL/min
GFR calc non Af Amer: >60 mL/min
Glucose, Bld: 168 mg/dL — ABNORMAL HIGH (ref 70–99)
Potassium: 3.7 mmol/L (ref 3.5–5.1)
Sodium: 137 mmol/L (ref 135–145)
Total Bilirubin: 0.7 mg/dL (ref 0.3–1.2)
Total Protein: 6.3 g/dL — ABNORMAL LOW (ref 6.5–8.1)

## 2019-07-31 LAB — CBC WITH DIFFERENTIAL/PLATELET
Abs Immature Granulocytes: 0.01 10*3/uL (ref 0.00–0.07)
Basophils Absolute: 0 10*3/uL (ref 0.0–0.1)
Basophils Relative: 0 %
Eosinophils Absolute: 0 10*3/uL (ref 0.0–0.5)
Eosinophils Relative: 0 %
HCT: 31.9 % — ABNORMAL LOW (ref 39.0–52.0)
Hemoglobin: 11.5 g/dL — ABNORMAL LOW (ref 13.0–17.0)
Immature Granulocytes: 1 %
Lymphocytes Relative: 5 %
Lymphs Abs: 0.1 10*3/uL — ABNORMAL LOW (ref 0.7–4.0)
MCH: 31 pg (ref 26.0–34.0)
MCHC: 36.1 g/dL — ABNORMAL HIGH (ref 30.0–36.0)
MCV: 86 fL (ref 80.0–100.0)
Monocytes Absolute: 0.3 10*3/uL (ref 0.1–1.0)
Monocytes Relative: 18 %
Neutro Abs: 1.4 10*3/uL — ABNORMAL LOW (ref 1.7–7.7)
Neutrophils Relative %: 76 %
Platelets: 117 10*3/uL — ABNORMAL LOW (ref 150–400)
RBC: 3.71 MIL/uL — ABNORMAL LOW (ref 4.22–5.81)
RDW: 11.8 % (ref 11.5–15.5)
WBC: 1.8 10*3/uL — ABNORMAL LOW (ref 4.0–10.5)
nRBC: 0 % (ref 0.0–0.2)

## 2019-07-31 LAB — C-REACTIVE PROTEIN: CRP: 1.9 mg/dL — ABNORMAL HIGH

## 2019-07-31 LAB — FIBRIN DERIVATIVES D-DIMER (ARMC ONLY): Fibrin derivatives D-dimer (ARMC): 676.39 ng/mL (FEU) — ABNORMAL HIGH (ref 0.00–499.00)

## 2019-07-31 LAB — GLUCOSE, CAPILLARY
Glucose-Capillary: 158 mg/dL — ABNORMAL HIGH (ref 70–99)
Glucose-Capillary: 147 mg/dL — ABNORMAL HIGH (ref 70–99)
Glucose-Capillary: 224 mg/dL — ABNORMAL HIGH (ref 70–99)
Glucose-Capillary: 247 mg/dL — ABNORMAL HIGH (ref 70–99)

## 2019-07-31 LAB — MAGNESIUM: Magnesium: 2.3 mg/dL (ref 1.7–2.4)

## 2019-07-31 LAB — FERRITIN: Ferritin: 636 ng/mL — ABNORMAL HIGH (ref 24–336)

## 2019-07-31 LAB — LACTATE DEHYDROGENASE: LDH: 168 U/L (ref 98–192)

## 2019-07-31 LAB — PHOSPHORUS: Phosphorus: 3 mg/dL (ref 2.5–4.6)

## 2019-07-31 NOTE — Progress Notes (Signed)
Date of Admission:  07/29/2019     ID: [name] is a 59 y.o. male  Principal Problem:   Sepsis ([place]) Active Problems:   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[place])   Type 2 diabetes mellitus without complication, without long-term current use of insulin ([facility])   Pulmonary embolus ([facility])   History of DVT (deep vein thrombosis)   Thrombocytopenia ([facility])   Lymphocytopenia   SOB (shortness of breath)    Subjective: Feeling better Sob better No oxygen  No fever  Medications:  . acetaminophen  1,000 mg Oral Once  . apixaban  5 mg Oral BID  . vitamin C  500 mg Oral Daily  . Chlorhexidine Gluconate Cloth  6 each Topical Daily  . dexamethasone (DECADRON) injection  6 mg Intravenous Q24H  . dextromethorphan-guaiFENesin  1 tablet Oral BID  . insulin aspart  0-5 Units Subcutaneous QHS  . insulin aspart  0-9 Units Subcutaneous TID WC  . Ipratropium-Albuterol  1 puff Inhalation Q6H  . mometasone-formoterol  2 puff Inhalation BID  . zinc sulfate  220 mg Oral Daily    Objective: Vital signs in last 24 hours: Temp:  [98.3 F (36.8 C)-99.8 F (37.7 C)] 98.3 F (36.8 C) (03/31 0056) Pulse Rate:  [73-94] 80 (03/31 0753) Resp:  [16-26] 16 (03/31 0753) BP: (125-145)/(81-90) 145/81 (03/31 0753) SpO2:  [91 %-96 %] 95 % (03/31 0700)  PHYSICAL EXAM:  General: Alert, cooperative, no distress, appears stated age.  Head: Normocephalic, without obvious abnormality, atraumatic. Eyes: Conjunctivae clear, anicteric sclerae. Pupils are equal ENT Nares normal. No drainage or sinus tenderness. Lips, mucosa, and tongue normal. No Thrush Neck: Supple, symmetrical, no adenopathy, thyroid: non tender no carotid bruit and no JVD. Back: No CVA tenderness. Lungs: b/l air entry- decreased bases Port chest wall Heart:s1s2 Abdomen: Soft, non-tender,not distended. Bowel sounds normal. No masses Extremities: atraumatic, no cyanosis. No edema. No clubbing Skin: No rashes or lesions. Or  bruising Lymph: Cervical, supraclavicular normal. Neurologic: Grossly non-focal  Lab Results Recent Labs    07/30/19 0445 07/31/19 0604  WBC 2.8*  2.7* 1.8*  HGB 11.6*  11.5* 11.5*  HCT 33.8*  33.7* 31.9*  NA 137 137  K 3.6 3.7  CL 106 107  CO2 22 23  BUN 10 14  CREATININE 0.97 0.91   Liver Panel Recent Labs    07/29/19 1103 07/31/19 0604  PROT 6.6 6.3*  ALBUMIN 3.5 3.2*  AST 24 25  ALT 22 23  ALKPHOS 51 43  BILITOT 0.5 0.7   Sedimentation Rate No results for input(s): ESRSEDRATE in the last 72 hours. C-Reactive Protein Recent Labs    07/30/19 0445 07/31/19 0604  CRP 2.3* 1.9*    Microbiology:  Studies/Results:   Assessment/Plan: 58 y.o. male with a history of PE follicular lymphoma on chemo is admitted with increasing shortness of breath, cough and fever.  ?SOB and fever- confusing picture He had positive covid test feb 11th when he was totally asymptomatic . Now he has resp symptoms and also fever and Rt PCR is positive. PCR can remain positive for many weeks but what is confusing is that his ct value is 28 which indicates somewhat of an acute infection. I was expecting the ct value to be more than 34 as he is 6 weeks past a positive test. It could be that chemo may have resulted in prolonged viral shedding and  symptoms D.D PE but CT negative Other causes for fever could be chemo related-  There is leucopenia but not neutropenia Could it be related to side effect of chemo?? He has a PORT and concern for vascular infection- but less likely Less likely this is PCP ( [facility]- N- beta D glucan pending) Will treat for COVID and agree with remdisivir  Currently on broad spectrum antibiotics- vanco and cefepime-if blood culture remains neg tomorrow cn Dc them   Discussed  the management with patient

## 2019-07-31 NOTE — Progress Notes (Signed)
Hematology/Oncology Progress Note [facility] Telephone:([phone] [phone]  Patient Care Team: Rusty Aus, MD as PCP - General (Internal Medicine)   Name of the patient: Edward Atkinson  TN:[id number]  Jan 23, 1961  Date of visit: 07/31/19   INTERVAL HISTORY-  No acute overnight events. Patient feels better. Still SOB with exertion.      Review of systems- Review of Systems  Respiratory: Positive for cough and shortness of breath.   Cardiovascular: Negative for chest pain.  Gastrointestinal: Negative for abdominal pain.  Genitourinary: Negative for dysuria.   Skin: Negative for rash.  Neurological: Negative for headaches.  Hematological: Negative for adenopathy.  Psychiatric/Behavioral: Negative for confusion.    Allergies  Allergen Reactions  . Penicillins Rash    Did it involve swelling of the face/tongue/throat, SOB, or low BP? No Did it involve sudden or severe rash/hives, skin peeling, or any reaction on the inside of your mouth or nose? No Did you need to seek medical attention at a hospital or doctor's office? No When did it last happen?Unknown If all above answers are "NO", may proceed with cephalosporin use.    Patient Active Problem List   Diagnosis Date Noted  . Lymphocytopenia   . SOB (shortness of breath)   . Sepsis ([place]) 07/29/2019  . Thrombocytopenia ([place]) 07/29/2019  . History of DVT (deep vein thrombosis) 07/06/2019  . Transaminitis 07/06/2019  . History of pulmonary embolism 07/06/2019  . Encounter for antineoplastic chemotherapy 04/29/2019  . Pulmonary embolus ([place]) 04/29/2019  . Follicular lymphoma of intra-abdominal lymph nodes ([place]) 02/15/2019  . Goals of care, counseling/discussion 02/15/2019  . DVT (deep venous thrombosis) ([place]) 02/12/2019  . Prostatitis 02/07/2019  . 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[place]) 02/06/2019  . Other pulmonary embolism with acute cor pulmonale ([place]) 02/06/2019  . Type  2 diabetes mellitus without complication, without long-term current use of insulin ([place]) 02/06/2019  . Massive pulmonary hemorrhage originating in perinatal period 01/29/2019     Past Medical History:  Diagnosis Date  . Diabetes mellitus without complication ([place])   . Diverticulitis   . Follicular lymphoma of intra-abdominal lymph nodes ([place]) 02/15/2019     Past Surgical History:  Procedure Laterality Date  . CHOLECYSTECTOMY    . COLON RESECTION    . PORTA CATH INSERTION N/A 03/11/2019   Procedure: PORTA CATH INSERTION;  Surgeon: Algernon Huxley, MD;  Location: [facility];  Service: Cardiovascular;  Laterality: N/A;  . PULMONARY THROMBECTOMY N/A 01/29/2019   Procedure: PULMONARY THROMBECTOMY;  Surgeon: Algernon Huxley, MD;  Location: [facility];  Service: Cardiovascular;  Laterality: N/A;    Social History   Socioeconomic History  . Marital status: Married    Spouse name: Not on file  . Number of children: Not on file  . Years of education: Not on file  . Highest education level: Not on file  Occupational History  . Not on file  Tobacco Use  . Smoking status: Never Smoker  . Smokeless tobacco: Never Used  Substance and Sexual Activity  . Alcohol use: Yes    Alcohol/week: 0.0 - 2.0 standard drinks    Comment: occasional  . Drug use: Never  . Sexual activity: Yes  Other Topics Concern  . Not on file  Social History Narrative  . Not on file   Social Determinants of Health   Financial Resource Strain:   . Difficulty of Paying Living Expenses:   Food Insecurity:   . Worried About  Running Out of Food in the Last Year:   . [place] in the Last Year:   Transportation Needs:   . Lack of Transportation (Medical):   Marland Kitchen Lack of Transportation (Non-Medical):   Physical Activity:   . Days of Exercise per Week:   . Minutes of Exercise per Session:   Stress:   . Feeling of Stress :   Social Connections:   . Frequency of Communication with Friends and  Family:   . Frequency of Social Gatherings with Friends and Family:   . Attends Religious Services:   . Active Member of Clubs or Organizations:   . Attends Archivist Meetings:   Marland Kitchen Marital Status:   Intimate Partner Violence:   . Fear of Current or Ex-Partner:   . Emotionally Abused:   Marland Kitchen Physically Abused:   . Sexually Abused:      Family History  Problem Relation Age of Onset  . Diabetes Brother      Current Facility-Administered Medications:  .  0.9 %  sodium chloride infusion, , Intravenous, Continuous, Ivor Costa, MD, Last Rate: 75 mL/hr at 07/31/19 1358, New Bag at 07/31/19 1358 .  acetaminophen (TYLENOL) tablet 1,000 mg, 1,000 mg, Oral, Once, Ivor Costa, MD .  acetaminophen (TYLENOL) tablet 650 mg, 650 mg, Oral, Q6H PRN, Ivor Costa, MD, 650 mg at 07/29/19 1334 .  albuterol (VENTOLIN HFA) 108 (90 Base) MCG/ACT inhaler 2 puff, 2 puff, Inhalation, Q4H PRN, Ivor Costa, MD, 2 puff at 07/31/19 2137 .  apixaban (ELIQUIS) tablet 5 mg, 5 mg, Oral, BID, Ivor Costa, MD, 5 mg at 07/31/19 2140 .  ascorbic acid (VITAMIN C) tablet 500 mg, 500 mg, Oral, Daily, Lorella Nimrod, MD, 500 mg at 07/31/19 1113 .  ceFEPIme (MAXIPIME) 2 g in sodium chloride 0.9 % 100 mL IVPB, 2 g, Intravenous, Q8H, Ivor Costa, MD, Last Rate: 200 mL/hr at 07/31/19 2156, 2 g at 07/31/19 2156 .  Chlorhexidine Gluconate Cloth 2 % PADS 6 each, 6 each, Topical, Daily, Lorella Nimrod, MD, 6 each at 07/31/19 1113 .  chlorpheniramine-HYDROcodone (TUSSIONEX) 10-8 MG/5ML suspension 5 mL, 5 mL, Oral, Q12H PRN, Lorella Nimrod, MD .  dexamethasone (DECADRON) injection 6 mg, 6 mg, Intravenous, Q24H, Lorella Nimrod, MD, 6 mg at 07/31/19 1118 .  dextromethorphan-guaiFENesin (MUCINEX DM) 30-600 MG per 12 hr tablet 1 tablet, 1 tablet, Oral, BID, Ivor Costa, MD, 1 tablet at 07/31/19 2139 .  guaiFENesin-dextromethorphan (ROBITUSSIN DM) 100-10 MG/5ML syrup 10 mL, 10 mL, Oral, Q4H PRN, Lorella Nimrod, MD .  insulin aspart (novoLOG)  injection 0-5 Units, 0-5 Units, Subcutaneous, QHS, Ivor Costa, MD, 2 Units at 07/31/19 2141 .  insulin aspart (novoLOG) injection 0-9 Units, 0-9 Units, Subcutaneous, TID WC, Ivor Costa, MD, 3 Units at 07/31/19 1801 .  Ipratropium-Albuterol (COMBIVENT) respimat 1 puff, 1 puff, Inhalation, Q6H, Lorella Nimrod, MD, 1 puff at 07/31/19 2038 .  mometasone-formoterol (DULERA) 100-5 MCG/ACT inhaler 2 puff, 2 puff, Inhalation, BID, Ivor Costa, MD, 2 puff at 07/31/19 2037 .  ondansetron (ZOFRAN) injection 4 mg, 4 mg, Intravenous, Q8H PRN, Ivor Costa, MD .  Margrett Rud remdesivir 200 mg in sodium chloride 0.9% 250 mL IVPB, 200 mg, Intravenous, Once, Stopped at 07/30/19 1222 **FOLLOWED BY** remdesivir 100 mg in sodium chloride 0.9 % 100 mL IVPB, 100 mg, Intravenous, Daily, Lorella Nimrod, MD, Last Rate: 200 mL/hr at 07/31/19 1314, 100 mg at 07/31/19 1314 .  vancomycin (VANCOREADY) IVPB 1250 mg/250 mL, 1,250 mg, Intravenous, Q12H, Shanlever, Pierce Crane, RPH,  Last Rate: 166.7 mL/hr at 07/31/19 2158, 1,250 mg at 07/31/19 2158 .  zinc sulfate capsule 220 mg, 220 mg, Oral, Daily, Lorella Nimrod, MD, 220 mg at 07/31/19 1111   Physical exam:  Vitals:   07/31/19 0700 07/31/19 0753 07/31/19 0800 07/31/19 1635  BP:  (!) 145/81 (!) 147/84 133/78  Pulse: 78 80 81 78  Resp: (!) 23 16 (!) 23 (!) 26  Temp:  (!) 97.3 F (36.3 C)  98 F (36.7 C)  TempSrc:  Oral  Oral  SpO2: 95%  96% 96%  Weight:      Height:       Physical Exam  Eyes: No scleral icterus.  Pulmonary/Chest: Effort normal. No respiratory distress.  Abdominal: Soft.  Musculoskeletal:        General: Normal range of motion.  Neurological: He is alert.  Skin: Skin is dry.  Psychiatric: Affect normal.       CMP Latest Ref Rng & Units 07/31/2019  Glucose 70 - 99 mg/dL 168(H)  BUN 8 - 23 mg/dL 14  Creatinine 0.61 - 1.24 mg/dL 0.91  Sodium 135 - 145 mmol/L 137  Potassium 3.5 - 5.1 mmol/L 3.7  Chloride 98 - 111 mmol/L 107  CO2 22 - 32 mmol/L 23    Calcium 8.9 - 10.3 mg/dL 8.6(L)  Total Protein 6.5 - 8.1 g/dL 6.3(L)  Total Bilirubin 0.3 - 1.2 mg/dL 0.7  Alkaline Phos 38 - 126 U/L 43  AST 15 - 41 U/L 25  ALT 0 - 44 U/L 23   CBC Latest Ref Rng & Units 07/31/2019  WBC 4.0 - 10.5 K/uL 1.8(L)  Hemoglobin 13.0 - 17.0 g/dL 11.5(L)  Hematocrit 39.0 - 52.0 % 31.9(L)  Platelets 150 - 400 K/uL 117(L)    RADIOGRAPHIC STUDIES: I have personally reviewed the radiological images as listed and agreed with the findings in the report. CT ANGIO CHEST PE W OR WO CONTRAST  Result Date: 07/25/2019 CLINICAL DATA:  Shortness of breath for months, history of lymphoma on chemotherapy, pulmonary embolism EXAM: CT ANGIOGRAPHY CHEST WITH CONTRAST TECHNIQUE: Multidetector CT imaging of the chest was performed using the standard protocol during bolus administration of intravenous contrast. Multiplanar CT image reconstructions and MIPs were obtained to evaluate the vascular anatomy. CONTRAST:  77mL OMNIPAQUE IOHEXOL 350 MG/ML SOLN IV COMPARISON:  01/29/2019 FINDINGS: Cardiovascular: Aorta normal caliber without aneurysm or dissection. Heart unremarkable. No pericardial effusion. Pulmonary arteries well opacified. Small linear filling defect identified in LEFT lower lobe pulmonary artery consistent with a synechia, sequela of prior embolus; patient had a large pulmonary embolus in LEFT lower lobe pulmonary artery on prior study which has otherwise resolved. No new pulmonary emboli identified. Mediastinum/Nodes: Base of cervical region normal appearance. Esophagus unremarkable. No thoracic adenopathy. Lungs/Pleura: Dependent atelectasis in BILATERAL lower lobes. Remaining lungs clear. No acute infiltrate, pleural effusion or pneumothorax. Upper Abdomen: Post cholecystectomy. Visualized upper abdomen otherwise unremarkable. Musculoskeletal: Mild osseous demineralization. No focal osseous abnormalities. Review of the MIP images confirms the above findings. IMPRESSION: Small  linear filling defect in LEFT lower lobe pulmonary artery consistent with a synechia, sequela of prior pulmonary embolism; patient had a large pulmonary embolus in LEFT lower lobe pulmonary artery on prior study which has otherwise resolved. No new pulmonary emboli identified. Dependent atelectasis in BILATERAL lower lobes. Electronically Signed   By: Lavonia Dana M.D.   On: 07/25/2019 17:12   DG Chest Port 1 View  Result Date: 07/29/2019 CLINICAL DATA:  Fever and chills. Shortness of breath  for 2 days. EXAM: PORTABLE CHEST 1 VIEW COMPARISON:  01/29/2019 chest x-ray and chest CT 07/25/2019 FINDINGS: Right IJ power port in good position without complicating features. Stable cardiac silhouette, mediastinal and hilar contours. Marked eventration/elevation of the right hemidiaphragm with overlying vascular crowding and atelectasis. Minimal streaky left basilar atelectasis. No definite infiltrates or effusions. No pneumothorax. The bony thorax is intact. IMPRESSION: 1. Marked eventration/elevation of the right hemidiaphragm with overlying vascular crowding and atelectasis. 2. Minimal streaky left basilar atelectasis. Electronically Signed   By: Marijo Sanes M.D.   On: 07/29/2019 11:18    Assessment and plan-   # COVID infection with hypoxia ID recommended reviewed.  Continue treatment for COVID infection with remedesivir and Decadron.  Less likely PCP per ID.  ID recommend to discontinue vancomycine and cefepime.    #Leukopenia, predominantly lymphocytopenia and mild neutropenia, ANC 1.4 Due to bone marrow suppression from recent chemotherapy and acute infection.  Bendamustine and Rituximab both cause lymphocytopenia.  Continue to monitor.   # history of PE and DVT, continue Eliquis 5mg  BID.  Thank you for allowing me to participate in the care of this patient.   Earlie Server, MD, PhD Hematology Oncology [facility] at [facility] Pager- [phone] 07/31/2019

## 2019-07-31 NOTE — Progress Notes (Signed)
PROGRESS NOTE    Edward Atkinson  [id number] DOB: May 05, 1960 DOA: 07/29/2019 PCP: Rusty Aus, MD   Brief Narrative:   Edward Atkinson is a 59 y.o. male with medical history significant of B-cell lymphoma on chemotherapy, diabetes mellitus, diverticulitis, PE/DVT on Eliquis, elevated right hemidiaphragm, COVID-19 infection 6 weeks ago, who presents with fever, chills, shortness of breath.  Patient states that he has been having fever, chills, shortness breath for more than 2 days.  His shortness breath has been progressively worsening.  He has mild dry cough, but no chest pain.  Denies nausea, vomiting, diarrhea, abdominal pain, symptoms of UTI or unilateral weakness.  He states that his last chemotherapy was on 3/18.  Patient has prior positive rapid Covid test at med center on 2/11 with no documentation and remained completely asymptomatic.  Covid PCR came back positive, elevated inflammatory markers.  ID was consulted and we are trying to figure out recent/old infection.  Started on remdesivir, Decadron with broad-spectrum antibiotics and will be narrowed down once more data is available.  Subjective:  Patient states that he feels generally pretty well.  Thinks what shortness of breath he had is improved.  Is eager to know his diagnosis and would like to go home when safe to do so.  Assessment & Plan:   Principal Problem:   Sepsis ([place]) Active Problems:   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HCC)   Type 2 diabetes mellitus without complication, without long-term current use of insulin (HCC)   Pulmonary embolus (HCC)   History of DVT (deep vein thrombosis)   Thrombocytopenia (HCC)   Lymphocytopenia   SOB (shortness of breath)  Covid infection with hypoxia Patient met initial sepsis criteria with fever, tachypnea, tachycardia and hypoxia, now improved. Unclear if Covid is new infection versus recurrent infection. Very much appreciate ongoing ID consultation Continue treatment  for acute Covid infection with remdesivir and Decadron Continue for pain and vancomycin for possibly leukopenic fever although per ID these may be discontinued tomorrow. There are less likely causes of pneumonia including PCP are in the differential per ID.  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[place]) Per Dr. Earlie Server of oncology   Type 2 diabetes mellitus without complication, without long-term current use of insulin ([place]):  Most recent A1c 10.5, poorly controled.  Hold glipizide SSI AC at bedtime  Pulmonary embolus and History of DVT (deep vein thrombosis):  Repeat CTA on 3/25 was without any new PE. Continue Eliquis   Objective: Vitals:   07/31/19 0700 07/31/19 0753 07/31/19 0800 07/31/19 1635  BP:  (!) 145/81 (!) 147/84 133/78  Pulse: 78 80 81 78  Resp: (!) 23 16 (!) 23 (!) 26  Temp:  (!) 97.3 F (36.3 C)  98 F (36.7 C)  TempSrc:  Oral  Oral  SpO2: 95%  96% 96%  Weight:      Height:        Intake/Output Summary (Last 24 hours) at 07/31/2019 1820 Last data filed at 07/31/2019 1400 Gross per 24 hour  Intake 2550.64 ml  Output 2100 ml  Net 450.64 ml   Filed Weights   07/29/19 0958  Weight: 92.5 kg    Examination:  General exam: Appearing man sitting up on side of bed in no acute distress Respiratory system: Clear to auscultation. Respiratory effort normal. Cardiovascular system: S1 & S2 heard, RRR. No JVD, murmurs, rubs, gallops or clicks. Gastrointestinal system: Soft, nontender, nondistended, bowel sounds positive. Central nervous system: Alert and oriented. No focal  neurological deficits.Symmetric 5 x 5 power. Extremities: No edema, no cyanosis, pulses intact and symmetrical. Skin: No rashes, lesions or ulcers Psychiatry: Judgement and insight appear normal. Mood & affect appropriate.    DVT prophylaxis: Eliquis Code Status: Full Family Communication: Patient was updated Disposition Plan: Pending improvement.  Patient will go back home.  Consultants:     ID  Oncology  Procedures:  Antimicrobials:  Cefepime Vancomycin  Data Reviewed: I have personally reviewed following labs and imaging studies  CBC: Recent Labs  Lab 07/29/19 1103 07/30/19 0445 07/31/19 0604  WBC 3.5* 2.8*  2.7* 1.8*  NEUTROABS 3.1 2.3 1.4*  HGB 12.9* 11.6*  11.5* 11.5*  HCT 37.5* 33.8*  33.7* 31.9*  MCV 87.4 88.9  87.8 86.0  PLT 110* 103*  102* 863*   Basic Metabolic Panel: Recent Labs  Lab 07/29/19 1103 07/30/19 0445 07/31/19 0604  NA 135 137 137  K 3.8 3.6 3.7  CL 103 106 107  CO2 '23 22 23  ' GLUCOSE 132* 113* 168*  BUN '14 10 14  ' CREATININE 1.11 0.97 0.91  CALCIUM 8.4* 8.4* 8.6*  MG  --   --  2.3  PHOS  --   --  3.0   GFR: Estimated Creatinine Clearance: 93.6 mL/min (by C-G formula based on SCr of 0.91 mg/dL). Liver Function Tests: Recent Labs  Lab 07/29/19 1103 07/31/19 0604  AST 24 25  ALT 22 23  ALKPHOS 51 43  BILITOT 0.5 0.7  PROT 6.6 6.3*  ALBUMIN 3.5 3.2*   No results for input(s): LIPASE, AMYLASE in the last 168 hours. No results for input(s): AMMONIA in the last 168 hours. Coagulation Profile: Recent Labs  Lab 07/29/19 1103  INR 1.2   Cardiac Enzymes: No results for input(s): CKTOTAL, CKMB, CKMBINDEX, TROPONINI in the last 168 hours. BNP (last 3 results) No results for input(s): PROBNP in the last 8760 hours. HbA1C: No results for input(s): HGBA1C in the last 72 hours. CBG: Recent Labs  Lab 07/30/19 1839 07/30/19 2053 07/31/19 0750 07/31/19 1246 07/31/19 1634  GLUCAP 256* 240* 158* 147* 224*   Lipid Profile: No results for input(s): CHOL, HDL, LDLCALC, TRIG, CHOLHDL, LDLDIRECT in the last 72 hours. Thyroid Function Tests: No results for input(s): TSH, T4TOTAL, FREET4, T3FREE, THYROIDAB in the last 72 hours. Anemia Panel: Recent Labs    07/30/19 0445 07/31/19 0604  FERRITIN 712* 636*   Sepsis Labs: Recent Labs  Lab 07/29/19 1103 07/30/19 0445  PROCALCITON  --  <0.10  LATICACIDVEN 1.2  --      Recent Results (from the past 240 hour(s))  Blood Culture (routine x 2)     Status: None (Preliminary result)   Collection Time: 07/29/19 11:03 AM   Specimen: BLOOD  Result Value Ref Range Status   Specimen Description BLOOD PORT  Final   Special Requests   Final    BOTTLES DRAWN AEROBIC AND ANAEROBIC Blood Culture adequate volume   Culture   Final    NO GROWTH 2 DAYS Performed at [facility], [address] [place], [place] [address]    Report Status PENDING  Incomplete  Blood Culture (routine x 2)     Status: None (Preliminary result)   Collection Time: 07/29/19 11:03 AM   Specimen: BLOOD  Result Value Ref Range Status   Specimen Description BLOOD PORT  Final   Special Requests   Final    BOTTLES DRAWN AEROBIC AND ANAEROBIC Blood Culture adequate volume   Culture   Final  NO GROWTH 2 DAYS Performed at [facility], [address] [place], [place] [address]    Report Status PENDING  Incomplete  Urine culture     Status: None   Collection Time: 07/29/19 11:03 AM   Specimen: In/Out Cath Urine  Result Value Ref Range Status   Specimen Description   Final    IN/OUT CATH URINE Performed at [facility], [address] [place], [place] [address]    Special Requests   Final    NONE Performed at [facility], [address] [place], [place] [address]    Culture   Final    NO GROWTH Performed at [facility], [place] [address], [place], [place] [address]    Report Status 07/30/2019 FINAL  Final  SARS CORONAVIRUS 2 (TAT 6-24 HRS) Nasopharyngeal Nasopharyngeal Swab     Status: Abnormal   Collection Time: 07/29/19 12:15 PM   Specimen: Nasopharyngeal Swab  Result Value Ref Range Status   SARS Coronavirus 2 POSITIVE (A) NEGATIVE Final    Comment: RESULT CALLED TO, READ BACK BY AND VERIFIED WITH: K.OLIVER RN 1925 07/29/19 MCCORMICK K (NOTE) SARS-CoV-2 target nucleic acids are DETECTED. The SARS-CoV-2 RNA is generally  detectable in upper and lower respiratory specimens during the acute phase of infection. Positive results are indicative of the presence of SARS-CoV-2 RNA. Clinical correlation with patient history and other diagnostic information is  necessary to determine patient infection status. Positive results do not rule out bacterial infection or co-infection with other viruses.  The expected result is Negative. Fact Sheet for Patients: SugarRoll.be Fact Sheet for Healthcare Providers: https://www.woods-mathews.com/ This test is not yet approved or cleared by the [place] FDA and  has been authorized for detection and/or diagnosis of SARS-CoV-2 by FDA under an Emergency Use Authorization (EUA). This EUA will remain  in effect (meaning this test can be used) for t he duration of the COVID-19 declaration under Section 564(b)(1) of the Act, 21 U.S.C. section 360bbb-3(b)(1), unless the authorization is terminated or revoked sooner. Performed at [facility], [place] [address] [place], [place] [address]   Respiratory Panel by PCR     Status: None   Collection Time: 07/29/19 12:15 PM   Specimen: Nasopharyngeal Swab; Respiratory  Result Value Ref Range Status   Adenovirus NOT DETECTED NOT DETECTED Final   Coronavirus 229E NOT DETECTED NOT DETECTED Final    Comment: (NOTE) The Coronavirus on the Respiratory Panel, DOES NOT test for the novel  Coronavirus (2019 nCoV)    Coronavirus HKU1 NOT DETECTED NOT DETECTED Final   Coronavirus NL63 NOT DETECTED NOT DETECTED Final   Coronavirus OC43 NOT DETECTED NOT DETECTED Final   Metapneumovirus NOT DETECTED NOT DETECTED Final   Rhinovirus / Enterovirus NOT DETECTED NOT DETECTED Final   Influenza A NOT DETECTED NOT DETECTED Final   Influenza B NOT DETECTED NOT DETECTED Final   Parainfluenza Virus 1 NOT DETECTED NOT DETECTED Final   Parainfluenza Virus 2 NOT DETECTED NOT DETECTED Final   Parainfluenza Virus  3 NOT DETECTED NOT DETECTED Final   Parainfluenza Virus 4 NOT DETECTED NOT DETECTED Final   Respiratory Syncytial Virus NOT DETECTED NOT DETECTED Final   Bordetella pertussis NOT DETECTED NOT DETECTED Final   Chlamydophila pneumoniae NOT DETECTED NOT DETECTED Final   Mycoplasma pneumoniae NOT DETECTED NOT DETECTED Final    Comment: Performed at [facility], [place]. [address] [place], [place] [address]  Respiratory Panel by RT PCR (Flu A&B,  Covid) - Nasopharyngeal Swab     Status: Abnormal   Collection Time: 07/29/19 12:15 PM   Specimen: Nasopharyngeal Swab  Result Value Ref Range Status   SARS Coronavirus 2 by RT PCR POSITIVE (A) NEGATIVE Final    Comment: CRITICAL VALUE NOTED.  VALUE IS CONSISTENT WITH PREVIOUSLY REPORTED AND CALLED VALUE. (NOTE) SARS-CoV-2 target nucleic acids are DETECTED. SARS-CoV-2 RNA is generally detectable in upper respiratory specimens  during the acute phase of infection. Positive results are indicative of the presence of the identified virus, but do not rule out bacterial infection or co-infection with other pathogens not detected by the test. Clinical correlation with patient history and other diagnostic information is necessary to determine patient infection status. The expected result is Negative. Fact Sheet for Patients:  PinkCheek.be Fact Sheet for Healthcare Providers: GravelBags.it This test is not yet approved or cleared by the [place] FDA and  has been authorized for detection and/or diagnosis of SARS-CoV-2 by FDA under an Emergency Use Authorization (EUA).  This EUA will remain in effect (meaning this test can be used) fo r the duration of  the COVID-19 declaration under Section 564(b)(1) of the Act, 21 U.S.C. section 360bbb-3(b)(1), unless the authorization is terminated or revoked sooner.    Influenza A by PCR NEGATIVE NEGATIVE Final   Influenza B by PCR NEGATIVE NEGATIVE  Final    Comment: (NOTE) The Xpert Xpress SARS-CoV-2/FLU/RSV assay is intended as an aid in  the diagnosis of influenza from Nasopharyngeal swab specimens and  should not be used as a sole basis for treatment. Nasal washings and  aspirates are unacceptable for Xpert Xpress SARS-CoV-2/FLU/RSV  testing. Fact Sheet for Patients: PinkCheek.be Fact Sheet for Healthcare Providers: GravelBags.it This test is not yet approved or cleared by the [place] FDA and  has been authorized for detection and/or diagnosis of SARS-CoV-2 by  FDA under an Emergency Use Authorization (EUA). This EUA will remain  in effect (meaning this test can be used) for the duration of the  Covid-19 declaration under Section 564(b)(1) of the Act, 21  U.S.C. section 360bbb-3(b)(1), unless the authorization is  terminated or revoked. Performed at [facility], [place] [address] [place], [place] [address]   Culture, sputum-assessment     Status: None   Collection Time: 07/30/19  8:01 AM   Specimen: Expectorated Sputum  Result Value Ref Range Status   Specimen Description EXPECTORATED SPUTUM  Final   Special Requests NONE  Final   Sputum evaluation   Final    Sputum specimen not acceptable for testing.  Please recollect.   REQUEST FOR RECOLLECT CALLED TO JACK DOUGHERTY AT 7619 ON 07/30/2019 [place]. Performed at [facility], [address] [place], [place] [address]    Report Status 07/30/2019 FINAL  Final  MRSA PCR Screening     Status: None   Collection Time: 07/30/19  8:23 AM   Specimen: Nasopharyngeal  Result Value Ref Range Status   MRSA by PCR NEGATIVE NEGATIVE Final    Comment:        The GeneXpert MRSA Assay (FDA approved for NASAL specimens only), is one component of a comprehensive MRSA colonization surveillance program. It is not intended to diagnose MRSA infection nor to guide or monitor treatment for MRSA  infections. Performed at [facility], [address], [place], [place] [address]      Radiology Studies: No results found.  Scheduled Meds: . acetaminophen  1,000 mg Oral Once  . apixaban  5  mg Oral BID  . vitamin C  500 mg Oral Daily  . Chlorhexidine Gluconate Cloth  6 each Topical Daily  . dexamethasone (DECADRON) injection  6 mg Intravenous Q24H  . dextromethorphan-guaiFENesin  1 tablet Oral BID  . insulin aspart  0-5 Units Subcutaneous QHS  . insulin aspart  0-9 Units Subcutaneous TID WC  . Ipratropium-Albuterol  1 puff Inhalation Q6H  . mometasone-formoterol  2 puff Inhalation BID  . zinc sulfate  220 mg Oral Daily   Continuous Infusions: . sodium chloride 75 mL/hr at 07/31/19 1358  . ceFEPime (MAXIPIME) IV 2 g (07/31/19 1400)  . remdesivir 100 mg in NS 100 mL 100 mg (07/31/19 1314)  . vancomycin 1,250 mg (07/31/19 1118)     LOS: 2 days   Time spent: 50 minutes.  Vashti Hey, MD [facility]  If 7PM-7AM, please contact night-coverage [email or web address]  07/31/2019, 6:20 PM   This record has been created using Systems analyst. Errors have been sought and corrected,but may not always be located. Such creation errors do not reflect on the standard of care.

## 2019-08-01 LAB — CBC WITH DIFFERENTIAL/PLATELET
Abs Immature Granulocytes: 0.02 10*3/uL (ref 0.00–0.07)
Basophils Absolute: 0 10*3/uL (ref 0.0–0.1)
Basophils Relative: 0 %
Eosinophils Absolute: 0 10*3/uL (ref 0.0–0.5)
Eosinophils Relative: 0 %
HCT: 31.5 % — ABNORMAL LOW (ref 39.0–52.0)
Hemoglobin: 11.1 g/dL — ABNORMAL LOW (ref 13.0–17.0)
Immature Granulocytes: 1 %
Lymphocytes Relative: 5 %
Lymphs Abs: 0.1 10*3/uL — ABNORMAL LOW (ref 0.7–4.0)
MCH: 30.4 pg (ref 26.0–34.0)
MCHC: 35.2 g/dL (ref 30.0–36.0)
MCV: 86.3 fL (ref 80.0–100.0)
Monocytes Absolute: 0.3 10*3/uL (ref 0.1–1.0)
Monocytes Relative: 14 %
Neutro Abs: 1.6 10*3/uL — ABNORMAL LOW (ref 1.7–7.7)
Neutrophils Relative %: 80 %
Platelets: 122 10*3/uL — ABNORMAL LOW (ref 150–400)
RBC: 3.65 MIL/uL — ABNORMAL LOW (ref 4.22–5.81)
RDW: 11.9 % (ref 11.5–15.5)
WBC: 2 10*3/uL — ABNORMAL LOW (ref 4.0–10.5)
nRBC: 0 % (ref 0.0–0.2)

## 2019-08-01 LAB — COMPREHENSIVE METABOLIC PANEL
ALT: 26 U/L (ref 0–44)
AST: 23 U/L (ref 15–41)
Albumin: 3.1 g/dL — ABNORMAL LOW (ref 3.5–5.0)
Alkaline Phosphatase: 45 U/L (ref 38–126)
Anion gap: 6 (ref 5–15)
BUN: 17 mg/dL (ref 8–23)
CO2: 24 mmol/L (ref 22–32)
Calcium: 8.7 mg/dL — ABNORMAL LOW (ref 8.9–10.3)
Chloride: 109 mmol/L (ref 98–111)
Creatinine, Ser: 0.66 mg/dL (ref 0.61–1.24)
GFR calc Af Amer: >60 mL/min (ref >60)
GFR calc non Af Amer: >60 mL/min (ref >60)
Glucose, Bld: 202 mg/dL — ABNORMAL HIGH (ref 70–99)
Potassium: 3.7 mmol/L (ref 3.5–5.1)
Sodium: 139 mmol/L (ref 135–145)
Total Bilirubin: 0.5 mg/dL (ref 0.3–1.2)
Total Protein: 6.2 g/dL — ABNORMAL LOW (ref 6.5–8.1)

## 2019-08-01 LAB — GLUCOSE, CAPILLARY
Glucose-Capillary: 180 mg/dL — ABNORMAL HIGH (ref 70–99)
Glucose-Capillary: 159 mg/dL — ABNORMAL HIGH (ref 70–99)

## 2019-08-01 LAB — FERRITIN: Ferritin: 827 ng/mL — ABNORMAL HIGH (ref 24–336)

## 2019-08-01 LAB — LEGIONELLA PNEUMOPHILA SEROGP 1 UR AG: L. pneumophila Serogp 1 Ur Ag: NEGATIVE

## 2019-08-01 LAB — PHOSPHORUS: Phosphorus: 3.6 mg/dL (ref 2.5–4.6)

## 2019-08-01 LAB — FIBRIN DERIVATIVES D-DIMER (ARMC ONLY): Fibrin derivatives D-dimer (ARMC): 509.83 ng/mL (FEU) — ABNORMAL HIGH (ref 0.00–499.00)

## 2019-08-01 LAB — MAGNESIUM: Magnesium: 2.2 mg/dL (ref 1.7–2.4)

## 2019-08-01 LAB — C-REACTIVE PROTEIN: CRP: 0.8 mg/dL (ref <1.0)

## 2019-08-01 MED ORDER — PREDNISONE 10 MG PO TABS: 40.0000 mg | ORAL_TABLET | Freq: Every day | ORAL | 0 refills | Status: AC

## 2019-08-01 NOTE — Discharge Instructions (Signed)
You are scheduled for an outpatient infusion of Remdesivir at 9:00 AM on Friday 4/2 and Saturday 4/3.  Please report to [facility] at [address]  Drive to the security guard and tell them you are here for an infusion. They will direct you to the front entrance where we will come and get you.  For questions call [phone].  Thanks

## 2019-08-01 NOTE — Progress Notes (Signed)
Patient scheduled for outpatient Remdesivir infusion at 9:00 AM on Friday 4/2 and Saturday 4/3.  Please advise them to report to [facility] at [address]  Drive to the security guard and tell them you are here for an infusion. They will direct you to the front entrance where we will come and get you.  For questions call [phone].  Thanks

## 2019-08-01 NOTE — Progress Notes (Addendum)
SATURATION QUALIFICATIONS: (This note is used to comply with regulatory documentation for home oxygen)  Patient Saturations on Room Air at Rest = 100%  Patient Saturations on Room Air while Ambulating = 87%  Patient Saturations on 2 Liters of oxygen while Ambulating = 98%  Please briefly explain why patient needs home oxygen:  Patient o2 saturation dropped during ambumlation requiring 2L of oxygen.

## 2019-08-01 NOTE — TOC Progression Note (Signed)
Transition of Care [facility]) - Progression Note    Patient Details  Name: Edward Atkinson MRN: [id number] Date of Birth: 31-Mar-1961  Transition of [facility]) CM/SW Contact  Naasia Weilbacher, Gardiner Rhyme, LCSW Phone Number: 08/01/2019, 11:20 AM  Clinical Narrative: According to MD and bedside RN pt will need home O2. Pt would rather not but aware he needs this. Have placed order for [employer] awaiting narrative for justification for this. Being set up with OP infusion and wife can transport to appointments. Pt being discharge home with wife today, who can assist if needed. Pt mobile and requires no equipment besides O2 for home.         Expected Discharge Plan and Services           Expected Discharge Date: 08/01/19                                     Social Determinants of Health (SDOH) Interventions    Readmission Risk Interventions No flowsheet data found.

## 2019-08-02 ENCOUNTER — Ambulatory Visit (HOSPITAL_COMMUNITY)
Admission: RE | Admit: 2019-08-02 | Discharge: 2019-08-02 | Disposition: A | Discharge status: Home / Self Care | Payer: BC Managed Care – PPO | Source: Ambulatory Visit | Attending: Pulmonary Disease | Admitting: Pulmonary Disease

## 2019-08-02 DIAGNOSIS — U071 COVID-19: Secondary | ICD-10-CM | POA: Insufficient documentation

## 2019-08-02 DIAGNOSIS — J1282 Pneumonia due to coronavirus disease 2019: Secondary | ICD-10-CM | POA: Insufficient documentation

## 2019-08-02 MED ORDER — METHYLPREDNISOLONE SODIUM SUCC 125 MG IJ SOLR: 125.0000 mg | Freq: Once | INTRAMUSCULAR | Status: DC | PRN

## 2019-08-02 MED ORDER — DIPHENHYDRAMINE HCL 50 MG/ML IJ SOLN: 50.0000 mg | Freq: Once | INTRAMUSCULAR | Status: DC | PRN

## 2019-08-02 MED ORDER — ALBUTEROL SULFATE HFA 108 (90 BASE) MCG/ACT IN AERS: 2.0000 | INHALATION_SPRAY | Freq: Once | RESPIRATORY_TRACT | Status: DC | PRN

## 2019-08-02 MED ORDER — SODIUM CHLORIDE 0.9 % IV SOLN: INTRAVENOUS | Status: DC | PRN

## 2019-08-02 MED ORDER — FAMOTIDINE IN NACL 20-0.9 MG/50ML-% IV SOLN: 20.0000 mg | Freq: Once | INTRAVENOUS | Status: DC | PRN

## 2019-08-02 MED ORDER — SODIUM CHLORIDE 0.9 % IV SOLN
100.0000 mg | Freq: Once | INTRAVENOUS | Status: AC
  Administered 2019-08-02: 09:00:00 100 mg via INTRAVENOUS
  Filled 2019-08-02: qty 20

## 2019-08-02 MED ORDER — EPINEPHRINE 0.3 MG/0.3ML IJ SOAJ: 0.3000 mg | Freq: Once | INTRAMUSCULAR | Status: DC | PRN

## 2019-08-02 NOTE — Progress Notes (Signed)
  Diagnosis: COVID-19  Physician: Dr. Joya Gaskins  Procedure: Covid Infusion Clinic Med: remdesivir infusion.  Complications: No immediate complications noted.  Discharge: Discharged home   Edward Atkinson 08/02/2019

## 2019-08-02 NOTE — Discharge Instructions (Signed)
Remdesivir What is this medicine? REMDESIVIR (rem DE si veer) is an antiviral medicine used to treat COVID-19. It will not work for colds, flu, or other viral infections. This medicine may be used for other purposes; ask your health care provider or pharmacist if you have questions. COMMON BRAND NAME(S): Marijean Heath What should I tell my health care provider before I take this medicine? They need to know if you have any of these conditions:  kidney disease  liver disease  an unusual or allergic reaction to remdesivir, other medicines, foods, dyes, or preservatives  pregnant or trying to get pregnant  breast-feeding How should I use this medicine? This medicine is for infusion into a vein. It is given by a health care professional in a hospital setting. Talk to your pediatrician regarding the use of this medicine in children. While this drug may be prescribed for selected conditions, precautions do apply. Overdosage: If you think you have taken too much of this medicine contact a poison control center or emergency room at once. NOTE: This medicine is only for you. Do not share this medicine with others. What if I miss a dose? This does not apply. What may interact with this medicine? Do not take this medicine with any of the following medications:  certain medicines for seizures like carbamazepine, phenobarbital, phenytoin, primidone  chloroquine  hydroxychloroquine  rifampin  rifapentine  St. John's wort This medicine may also interact with the following medications:  dexamethasone  eslicarbazepine  oxcarbazepine  rifabutin  rufinamide This list may not describe all possible interactions. Give your health care provider a list of all the medicines, herbs, non-prescription drugs, or dietary supplements you use. Also tell them if you smoke, drink alcohol, or use illegal drugs. Some items may interact with your medicine. What should I watch for while using this  medicine? Your condition will be monitored carefully while you are receiving this medicine. Visit your doctor or health care professional for regular check-ups. Tell your doctor if your symptoms do not start to get better or if they get worse. What side effects may I notice from receiving this medicine? Side effects that you should report to your doctor or health care professional as soon as possible:  allergic reactions like skin rash, itching or hives, swelling of the face, lips, or tongue  breathing problems  fast, irregular heartbeat Side effects that usually do not require medical attention (report these to your doctor or health care professional if they continue or are bothersome):  constipation  diarrhea  headache  nausea This list may not describe all possible side effects. Call your doctor for medical advice about side effects. You may report side effects to FDA at 1-800-FDA-1088. Where should I keep my medicine? This drug is given in a hospital and will not be stored at home. NOTE: This sheet is a summary. It may not cover all possible information. If you have questions about this medicine, talk to your doctor, pharmacist, or health care provider.  2020 Elsevier/Gold Standard (2018-10-24 09:51:25) 10 Things You Can Do to Manage Your COVID-19 Symptoms at Home If you have possible or confirmed COVID-19: 1. Stay home from work and school. And stay away from other public places. If you must go out, avoid using any kind of public transportation, ridesharing, or taxis. 2. Monitor your symptoms carefully. If your symptoms get worse, call your healthcare provider immediately. 3. Get rest and stay hydrated. 4. If you have a medical appointment, call the healthcare provider ahead of  time and tell them that you have or may have COVID-19. 5. For medical emergencies, call 911 and notify the dispatch personnel that you have or may have COVID-19. 6. Cover your cough and sneezes with a  tissue or use the inside of your elbow. 7. Wash your hands often with soap and water for at least 20 seconds or clean your hands with an alcohol-based hand sanitizer that contains at least 60% alcohol. 8. As much as possible, stay in a specific room and away from other people in your home. Also, you should use a separate bathroom, if available. If you need to be around other people in or outside of the home, wear a mask. 9. Avoid sharing personal items with other people in your household, like dishes, towels, and bedding. 10. Clean all surfaces that are touched often, like counters, tabletops, and doorknobs. Use household cleaning sprays or wipes according to the label instructions. [email or web address].[email or web address] 10/31/2018 This information is not intended to replace advice given to you by your health care provider. Make sure you discuss any questions you have with your health care provider. Document Revised: 04/04/2019 Document Reviewed: 04/04/2019 Elsevier Patient Education  [place].

## 2019-08-03 ENCOUNTER — Ambulatory Visit (HOSPITAL_COMMUNITY)
Admit: 2019-08-03 | Discharge: 2019-08-03 | Disposition: A | Discharge status: Home / Self Care | Payer: BC Managed Care – PPO | Attending: Pulmonary Disease | Admitting: Pulmonary Disease

## 2019-08-03 DIAGNOSIS — U071 COVID-19: Secondary | ICD-10-CM | POA: Diagnosis not present

## 2019-08-03 LAB — CULTURE, BLOOD (ROUTINE X 2)
Culture: NO GROWTH
Culture: NO GROWTH
Special Requests: ADEQUATE
Special Requests: ADEQUATE

## 2019-08-03 LAB — FUNGITELL, SERUM: Fungitell Result: <31 pg/mL (ref <80)

## 2019-08-03 MED ORDER — FAMOTIDINE IN NACL 20-0.9 MG/50ML-% IV SOLN: 20.0000 mg | Freq: Once | INTRAVENOUS | Status: DC | PRN

## 2019-08-03 MED ORDER — DIPHENHYDRAMINE HCL 50 MG/ML IJ SOLN: 50.0000 mg | Freq: Once | INTRAMUSCULAR | Status: DC | PRN

## 2019-08-03 MED ORDER — ALBUTEROL SULFATE HFA 108 (90 BASE) MCG/ACT IN AERS: 2.0000 | INHALATION_SPRAY | Freq: Once | RESPIRATORY_TRACT | Status: DC | PRN

## 2019-08-03 MED ORDER — SODIUM CHLORIDE 0.9 % IV SOLN
100.0000 mg | Freq: Once | INTRAVENOUS | Status: AC
  Administered 2019-08-03: 09:00:00 100 mg via INTRAVENOUS
  Filled 2019-08-03: qty 20

## 2019-08-03 MED ORDER — SODIUM CHLORIDE 0.9 % IV SOLN: INTRAVENOUS | Status: DC | PRN

## 2019-08-03 MED ORDER — METHYLPREDNISOLONE SODIUM SUCC 125 MG IJ SOLR: 125.0000 mg | Freq: Once | INTRAMUSCULAR | Status: DC | PRN

## 2019-08-03 MED ORDER — EPINEPHRINE 0.3 MG/0.3ML IJ SOAJ: 0.3000 mg | Freq: Once | INTRAMUSCULAR | Status: DC | PRN

## 2019-08-03 NOTE — Discharge Instructions (Signed)

## 2019-08-03 NOTE — Progress Notes (Signed)
  Diagnosis: COVID-19  Physician: Dr. Joya Gaskins  Procedure: Covid Infusion Clinic Med: remdesivir infusion.  Complications: No immediate complications noted.  Discharge: Discharged home   Acquanetta Chain 08/03/2019

## 2019-08-08 NOTE — Progress Notes (Signed)
Pharmacist Chemotherapy Monitoring - Follow Up Assessment    I verify that I have reviewed each item in the below checklist:  . Regimen for the patient is scheduled for the appropriate day and plan matches scheduled date. Marland Kitchen Appropriate non-routine labs are ordered dependent on drug ordered. . If applicable, additional medications reviewed and ordered per protocol based on lifetime cumulative doses and/or treatment regimen.   Plan for follow-up and/or issues identified: No . I-vent associated with next due treatment: No . MD and/or nursing notified: No  Therese Rocco K 08/08/2019 9:16 AM

## 2019-08-09 NOTE — Progress Notes (Signed)

## 2019-08-09 NOTE — Discharge Summary (Signed)
Edward Atkinson [id number] DOB: Oct 13, 1960 DOA: 07/29/2019  PCP: Rusty Aus, MD  Admit date: 07/29/2019  Discharge date: 08/09/2019  Admitted From: Home   disposition: Home   Recommendations for Outpatient Follow-up:   Follow up with PCP in 1-2 weeks   Home Health: N/A Equipment/Devices: N/A Consultations: N/A Discharge Condition: Improved CODE STATUS: Full Diet Recommendation: Heart Healthy   Diet Order            Diet - low sodium heart healthy               Chief Complaint  Patient presents with   Fever   Shortness of Breath     Brief history of present illness from the day of admission and additional interim summary    Edward Atkinson a 59 y.o.malewith medical history significant ofB-cell lymphoma on chemotherapy, diabetes mellitus, diverticulitis, PE/DVT on Eliquis, elevated right hemidiaphragm, COVID-19 infection 6 weeks ago, who presents with fever, chills, shortness of breath.  Patient states that he has been having fever, chills, shortness breath for more than 2 days.His shortness breath has been progressively worsening. He has mild dry cough, but no chest pain. Denies nausea, vomiting, diarrhea, abdominal pain, symptoms of UTI or unilateral weakness. He states that his last chemotherapy was on 3/18.  Patient has prior positive rapid Covid test at med center on 2/11 with no documentation and remained completely asymptomatic.  Covid PCR came back positive, elevated inflammatory markers.  ID was consulted and we are trying to figure out recent/old infection.  Started on remdesivir, Decadron with broad-spectrum antibiotics and will be narrowed down once more data is available.                                                                  Hospital Course    Patient was seen  by infectious disease who noted that initial picture was confusing.  ID consult noted that he had positive Covid test on February 11 when he was completely asymptomatic however presented in March with fevers and a repeat positive Covid PCR.  What was confusing is that his CT value was 28 which is indicative of a more acute infection so it is unclear if the initial February test was a false positive or not.  Dr. Tama High noted that chemo may have resulted in prolonged viral shedding and symptoms.  Patient's initial differential diagnosis included Covid pneumonia versus neutropenic fever versus Pneumocystis carinii pneumonia.  Patient was treated for Covid infection with remdesivir and Decadron and also treated with vancomycin and cefepime to cover for leukopenic fever, patient never did have any neutropenia. CT angiogram was done which ruled out PE.  Patient improved over the course of his hospital stay with improving energy, decreasing malaise and myalgia and increasing exercise tolerance  with decreased DOE.  Oxygenation also improved over the course of his stay.  On day of discharge patient states he felt much better and was requesting to go home.   Covid infection with hypoxia Patient improved significantly with treatment with remdesivir and Decadron. Patient completed a 5-day course of remdesivir and was discharged home to complete 4 days of Decadron for complete 10-day course.  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[place]) Per Dr. Earlie Server of oncology  Type 2 diabetes mellitus without complication, without long-term current use of insulin ([place]): Most recent A1c10.5, poorlycontroled.  This will need to be addressed as an outpatient, patient's home glipizide was restarted.  Pulmonary embolusandHistory of DVT (deep vein thrombosis):  Repeat CTA on 3/25 was without any new PE. Continue Eliquis   Discharge diagnosis     Principal Problem:   Sepsis ([place]) Active Problems:    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HCC)   Type 2 diabetes mellitus without complication, without long-term current use of insulin (HCC)   Pulmonary embolus (HCC)   History of DVT (deep vein thrombosis)   Thrombocytopenia (HCC)   Lymphocytopenia   SOB (shortness of breath)    Discharge instructions    Discharge Instructions    Diet - low sodium heart healthy   Complete by: As directed    Discharge instructions   Complete by: As directed    1.  You will need to go to [facility] in [place] for remdesivir infusion for the next 2 days.  This has been scheduled. 2.  You will need to take prednisone 40 mg daily for the next 7 days. 3.  Contact your PCP if you start to feel worse or if your shortness of breath worsens.   Increase activity slowly   Complete by: As directed       Discharge Medications   Allergies as of 08/01/2019      Reactions   Penicillins Rash   Did it involve swelling of the face/tongue/throat, SOB, or low BP? No Did it involve sudden or severe rash/hives, skin peeling, or any reaction on the inside of your mouth or nose? No Did you need to seek medical attention at a hospital or doctor's office? No When did it last happen?Unknown If all above answers are NO, may proceed with cephalosporin use.      Medication List    STOP taking these medications   levofloxacin 500 MG tablet Commonly known as: Levaquin     TAKE these medications   Advair Diskus 100-50 MCG/DOSE Aepb Generic drug: Fluticasone-Salmeterol Inhale 1 puff into the lungs 2 (two) times daily.   apixaban 5 MG Tabs tablet Commonly known as: Eliquis Take 1 tablet (5 mg total) by mouth 2 (two) times daily.   dextromethorphan-guaiFENesin 30-600 MG 12hr tablet Commonly known as: MUCINEX DM Take 1 tablet by mouth 2 (two) times daily as needed for cough.   glimepiride 4 MG tablet Commonly known as: AMARYL Take 4 mg by mouth daily with breakfast.   lidocaine-prilocaine  cream Commonly known as: EMLA Apply 1 application topically as needed.   prochlorperazine 10 MG tablet Commonly known as: COMPAZINE Take 1 tablet (10 mg total) by mouth every 6 (six) hours as needed (Nausea or vomiting).     ASK your doctor about these medications   predniSONE 10 MG tablet Commonly known as: DELTASONE Take 4 tablets (40 mg total) by mouth daily for 7 days. Ask about: Should I take this medication?  Major procedures and Radiology Reports - PLEASE review detailed and final reports thoroughly  -       CT ANGIO CHEST PE W OR WO CONTRAST  Result Date: 07/25/2019 CLINICAL DATA:  Shortness of breath for months, history of lymphoma on chemotherapy, pulmonary embolism EXAM: CT ANGIOGRAPHY CHEST WITH CONTRAST TECHNIQUE: Multidetector CT imaging of the chest was performed using the standard protocol during bolus administration of intravenous contrast. Multiplanar CT image reconstructions and MIPs were obtained to evaluate the vascular anatomy. CONTRAST:  10mL OMNIPAQUE IOHEXOL 350 MG/ML SOLN IV COMPARISON:  01/29/2019 FINDINGS: Cardiovascular: Aorta normal caliber without aneurysm or dissection. Heart unremarkable. No pericardial effusion. Pulmonary arteries well opacified. Small linear filling defect identified in LEFT lower lobe pulmonary artery consistent with a synechia, sequela of prior embolus; patient had a large pulmonary embolus in LEFT lower lobe pulmonary artery on prior study which has otherwise resolved. No new pulmonary emboli identified. Mediastinum/Nodes: Base of cervical region normal appearance. Esophagus unremarkable. No thoracic adenopathy. Lungs/Pleura: Dependent atelectasis in BILATERAL lower lobes. Remaining lungs clear. No acute infiltrate, pleural effusion or pneumothorax. Upper Abdomen: Post cholecystectomy. Visualized upper abdomen otherwise unremarkable. Musculoskeletal: Mild osseous demineralization. No focal osseous abnormalities. Review of the MIP  images confirms the above findings. IMPRESSION: Small linear filling defect in LEFT lower lobe pulmonary artery consistent with a synechia, sequela of prior pulmonary embolism; patient had a large pulmonary embolus in LEFT lower lobe pulmonary artery on prior study which has otherwise resolved. No new pulmonary emboli identified. Dependent atelectasis in BILATERAL lower lobes. Electronically Signed   By: Lavonia Dana M.D.   On: 07/25/2019 17:12   DG Chest Port 1 View  Result Date: 07/29/2019 CLINICAL DATA:  Fever and chills. Shortness of breath for 2 days. EXAM: PORTABLE CHEST 1 VIEW COMPARISON:  01/29/2019 chest x-ray and chest CT 07/25/2019 FINDINGS: Right IJ power port in good position without complicating features. Stable cardiac silhouette, mediastinal and hilar contours. Marked eventration/elevation of the right hemidiaphragm with overlying vascular crowding and atelectasis. Minimal streaky left basilar atelectasis. No definite infiltrates or effusions. No pneumothorax. The bony thorax is intact. IMPRESSION: 1. Marked eventration/elevation of the right hemidiaphragm with overlying vascular crowding and atelectasis. 2. Minimal streaky left basilar atelectasis. Electronically Signed   By: Marijo Sanes M.D.   On: 07/29/2019 11:18    Micro Results   No results found for this or any previous visit (from the past 240 hour(s)).  Today   Subjective    Edward Atkinson feels much improved since admission.  Feels ready to go home.  Denies chest pain, shortness of breath or abdominal pain.  Feels he can take care of themselves with the resources he has at home.  Objective   Blood pressure 132/77, pulse 72, temperature 98 F (36.7 C), temperature source Oral, resp. rate (!) 24, height 6' (1.829 m), weight 92.5 kg, SpO2 96 %.  No intake or output data in the 24 hours ending 08/09/19 1536  Exam General: Patient appears well and in good spirits sitting up in bed in no acute distress.  Eyes: sclera  anicteric, conjuctiva mild injection bilaterally CVS: S1-S2, regular  Respiratory:  decreased air entry bilaterally secondary to decreased inspiratory effort, rales at bases  GI: NABS, soft, NT  LE: No edema.  Neuro: A/O x 3, Moving all extremities equally with normal strength, CN 3-12 intact, grossly nonfocal.  Psych: patient is logical and coherent, judgement and insight appear normal, mood and affect appropriate to situation.  Data Review   CBC w Diff:  Lab Results  Component Value Date   WBC 2.0 (L) 08/01/2019   HGB 11.1 (L) 08/01/2019   HCT 31.5 (L) 08/01/2019   PLT 122 (L) 08/01/2019   LYMPHOPCT 5 08/01/2019   MONOPCT 14 08/01/2019   EOSPCT 0 08/01/2019   BASOPCT 0 08/01/2019    CMP:  Lab Results  Component Value Date   NA 139 08/01/2019   K 3.7 08/01/2019   CL 109 08/01/2019   CO2 24 08/01/2019   BUN 17 08/01/2019   CREATININE 0.66 08/01/2019   PROT 6.2 (L) 08/01/2019   ALBUMIN 3.1 (L) 08/01/2019   BILITOT 0.5 08/01/2019   ALKPHOS 45 08/01/2019   AST 23 08/01/2019   ALT 26 08/01/2019  .   Total Time in preparing paper work, data evaluation and todays exam - 35 minutes  Vashti Hey M.D on 08/09/2019 at 3:36 PM  [facility]   Office  [phone]

## 2019-08-15 ENCOUNTER — Inpatient Hospital Stay: Payer: BC Managed Care – PPO

## 2019-08-15 ENCOUNTER — Inpatient Hospital Stay: Payer: BC Managed Care – PPO | Attending: Oncology

## 2019-08-15 ENCOUNTER — Other Ambulatory Visit: Payer: Self-pay

## 2019-08-15 ENCOUNTER — Encounter: Payer: Self-pay | Admitting: Oncology

## 2019-08-15 ENCOUNTER — Inpatient Hospital Stay (HOSPITAL_BASED_OUTPATIENT_CLINIC_OR_DEPARTMENT_OTHER): Payer: BC Managed Care – PPO | Admitting: Oncology

## 2019-08-15 VITALS — BP 117/79 | HR 83 | Temp 97.5°F | Resp 18 | Wt 192.9 lb

## 2019-08-15 DIAGNOSIS — R634 Abnormal weight loss: Secondary | ICD-10-CM | POA: Diagnosis not present

## 2019-08-15 DIAGNOSIS — Z86718 Personal history of other venous thrombosis and embolism: Secondary | ICD-10-CM | POA: Insufficient documentation

## 2019-08-15 DIAGNOSIS — R000 Tachycardia, unspecified: Secondary | ICD-10-CM | POA: Diagnosis not present

## 2019-08-15 DIAGNOSIS — Z86711 Personal history of pulmonary embolism: Secondary | ICD-10-CM | POA: Insufficient documentation

## 2019-08-15 DIAGNOSIS — C8293 Follicular lymphoma, unspecified, intra-abdominal lymph nodes: Secondary | ICD-10-CM

## 2019-08-15 DIAGNOSIS — E119 Type 2 diabetes mellitus without complications: Secondary | ICD-10-CM | POA: Diagnosis not present

## 2019-08-15 DIAGNOSIS — U071 COVID-19: Secondary | ICD-10-CM | POA: Diagnosis not present

## 2019-08-15 DIAGNOSIS — R599 Enlarged lymph nodes, unspecified: Secondary | ICD-10-CM | POA: Diagnosis not present

## 2019-08-15 DIAGNOSIS — J1282 Pneumonia due to coronavirus disease 2019: Secondary | ICD-10-CM | POA: Diagnosis not present

## 2019-08-15 DIAGNOSIS — Z7984 Long term (current) use of oral hypoglycemic drugs: Secondary | ICD-10-CM | POA: Diagnosis not present

## 2019-08-15 DIAGNOSIS — Z8719 Personal history of other diseases of the digestive system: Secondary | ICD-10-CM | POA: Diagnosis not present

## 2019-08-15 DIAGNOSIS — R161 Splenomegaly, not elsewhere classified: Secondary | ICD-10-CM | POA: Insufficient documentation

## 2019-08-15 DIAGNOSIS — J129 Viral pneumonia, unspecified: Secondary | ICD-10-CM

## 2019-08-15 DIAGNOSIS — R0682 Tachypnea, not elsewhere classified: Secondary | ICD-10-CM | POA: Insufficient documentation

## 2019-08-15 DIAGNOSIS — Z95828 Presence of other vascular implants and grafts: Secondary | ICD-10-CM

## 2019-08-15 DIAGNOSIS — Z79899 Other long term (current) drug therapy: Secondary | ICD-10-CM | POA: Diagnosis not present

## 2019-08-15 DIAGNOSIS — C8280 Other types of follicular lymphoma, unspecified site: Secondary | ICD-10-CM

## 2019-08-15 LAB — CBC WITH DIFFERENTIAL/PLATELET
Abs Immature Granulocytes: 0.11 10*3/uL — ABNORMAL HIGH (ref 0.00–0.07)
Basophils Absolute: 0 10*3/uL (ref 0.0–0.1)
Basophils Relative: 0 %
Eosinophils Absolute: 0 10*3/uL (ref 0.0–0.5)
Eosinophils Relative: 0 %
HCT: 40.9 % (ref 39.0–52.0)
Hemoglobin: 14.2 g/dL (ref 13.0–17.0)
Immature Granulocytes: 1 %
Lymphocytes Relative: 4 %
Lymphs Abs: 0.4 10*3/uL — ABNORMAL LOW (ref 0.7–4.0)
MCH: 30.1 pg (ref 26.0–34.0)
MCHC: 34.7 g/dL (ref 30.0–36.0)
MCV: 86.8 fL (ref 80.0–100.0)
Monocytes Absolute: 0.7 10*3/uL (ref 0.1–1.0)
Monocytes Relative: 7 %
Neutro Abs: 8.7 10*3/uL — ABNORMAL HIGH (ref 1.7–7.7)
Neutrophils Relative %: 88 %
Platelets: 122 10*3/uL — ABNORMAL LOW (ref 150–400)
RBC: 4.71 MIL/uL (ref 4.22–5.81)
RDW: 11.9 % (ref 11.5–15.5)
WBC: 9.9 10*3/uL (ref 4.0–10.5)
nRBC: 0 % (ref 0.0–0.2)

## 2019-08-15 LAB — COMPREHENSIVE METABOLIC PANEL
ALT: 31 U/L (ref 0–44)
AST: 15 U/L (ref 15–41)
Albumin: 3.7 g/dL (ref 3.5–5.0)
Alkaline Phosphatase: 58 U/L (ref 38–126)
Anion gap: 11 (ref 5–15)
BUN: 29 mg/dL — ABNORMAL HIGH (ref 8–23)
CO2: 24 mmol/L (ref 22–32)
Calcium: 9.1 mg/dL (ref 8.9–10.3)
Chloride: 97 mmol/L — ABNORMAL LOW (ref 98–111)
Creatinine, Ser: 0.93 mg/dL (ref 0.61–1.24)
GFR calc Af Amer: >60 mL/min (ref >60)
GFR calc non Af Amer: >60 mL/min (ref >60)
Glucose, Bld: 195 mg/dL — ABNORMAL HIGH (ref 70–99)
Potassium: 4.6 mmol/L (ref 3.5–5.1)
Sodium: 132 mmol/L — ABNORMAL LOW (ref 135–145)
Total Bilirubin: 0.8 mg/dL (ref 0.3–1.2)
Total Protein: 7 g/dL (ref 6.5–8.1)

## 2019-08-15 MED ORDER — HEPARIN SOD (PORK) LOCK FLUSH 100 UNIT/ML IV SOLN
INTRAVENOUS | Status: AC
  Filled 2019-08-15: qty 5

## 2019-08-15 MED ORDER — HEPARIN SOD (PORK) LOCK FLUSH 100 UNIT/ML IV SOLN
500.0000 [IU] | Freq: Once | INTRAVENOUS | Status: AC
  Administered 2019-08-15: 500 [IU] via INTRAVENOUS
  Filled 2019-08-15: qty 5

## 2019-08-15 NOTE — Progress Notes (Signed)
Patient has lost 12 lbs despite having a good appetite.

## 2019-08-15 NOTE — Progress Notes (Signed)
Hematology/Oncology Follow Up Note [facility]  Telephone:([phone] [phone]  Patient Care Team: Rusty Aus, MD as PCP - General (Internal Medicine)   Name of the patient: Edward Atkinson  [id number]  November 18, 1960   REASON FOR VISIT  follow-up for management of pulmonary embolism, follicular cell lymphoma.  PERTINENT ONCOLOGY HISTORY # 01/31/2019 Unprovoked PE,  CT chest PE protocol reviewed bilateral massive pulmonary embolism with right heart strain and patient was started on heparin drip.  Patient was tachypneic and tachycardia.  Vascular surgeon was consulted and the patient was taken to the OR for thrombectomy and thrombolysis.  Patient also had selective catheter placement right upper lobe, middle lobe, lower lobe pulmonary arteries, left upper and lower pulmonary arteries. Ultrasound venous duplex bilateral showed DVT of the left lower extremity with nonocclusive thrombus in the left common femoral vein.  Some of the common femoral venous thrombus does extend just into the GSV across SF J.  No evidence of right lower extremity DVT. Patient was started on heparin drip for anticoagulation. Patient is status post embolectomy by vascular surgery. #Additional CT pancreas was done during that admission which showed peripancreatic/retroperitoneal and contiguous small bowel mesentery nodal mass, retroperitoneal adenopathy, gastrohepatic ligament lymphadenopathy, left abdominal omental nodule CT-guided omentum biopsy was done during hospital.  Patient was switched to Lovenox at discharge.  #  PET scan done on 02/11/2019 which showed large conglomerate retroperitoneal and mesenteric mass with maximal SUV 10.6 compatible with Deauville 5 disease.  Deauville 4 adenopathy in the pelvis. A left axillary lymph node measuring 0.7 cm in short axis has a maximum SUV of 3.3. Mild splenomegaly without focal splenic lesion identified. #02/21/2019, bone marrow biopsy  showed no lymphoma involvement.  There might be lymphocyte aggregates that were being cut away on the deeper levels.  Additional IHC did not show residual echograms.  INTERVAL HISTORY 59 y.o. male with newly diagnosed pulmonary embolism and follicular lymphoma present for follow-up-evaluation prior to cycle 6 BR Patient was admitted from 07/29/2019- 08/09/2019 due to COVID pneumonia. Treated with remdesivir and steroids.  He was discharged and still felt some shortness of breath and was seen by PCP Dr.Elston on 08/09/2019 He was given another course of antibiotics with moxifloxacin 400 mg daily for 10 days, Prednisone tapering and nebulizer. Today patient reports that his symptom is better.  He has 5 more days of antibiotics left. No fever or chills.  He has lost weight since his admission. Review of Systems  Constitutional: Negative for appetite change, chills, fatigue, fever and unexpected weight change.  HENT:   Negative for hearing loss and voice change.   Eyes: Negative for eye problems and icterus.  Respiratory: Positive for shortness of breath. Negative for chest tightness and cough.   Cardiovascular: Negative for chest pain and leg swelling.  Gastrointestinal: Negative for abdominal distention and abdominal pain.  Endocrine: Negative for hot flashes.  Genitourinary: Negative for difficulty urinating, dysuria and frequency.   Musculoskeletal: Negative for arthralgias.  Skin: Negative for itching and rash.  Neurological: Negative for light-headedness and numbness.  Hematological: Negative for adenopathy. Does not bruise/bleed easily.  Psychiatric/Behavioral: Negative for confusion.      Allergies  Allergen Reactions  . Penicillins Rash    Did it involve swelling of the face/tongue/throat, SOB, or low BP? No Did it involve sudden or severe rash/hives, skin peeling, or any reaction on the inside of your mouth or nose? No Did you need to seek medical attention at a  hospital or doctor's  office? No When did it last happen?Unknown If all above answers are "NO", may proceed with cephalosporin use.     Past Medical History:  Diagnosis Date  . Diabetes mellitus without complication ([place])   . Diverticulitis   . Follicular lymphoma of intra-abdominal lymph nodes ([place]) 02/15/2019     Past Surgical History:  Procedure Laterality Date  . CHOLECYSTECTOMY    . COLON RESECTION    . PORTA CATH INSERTION N/A 03/11/2019   Procedure: PORTA CATH INSERTION;  Surgeon: Algernon Huxley, MD;  Location: [place] [facility];  Service: Cardiovascular;  Laterality: N/A;  . PULMONARY THROMBECTOMY N/A 01/29/2019   Procedure: PULMONARY THROMBECTOMY;  Surgeon: Algernon Huxley, MD;  Location: [place] [facility];  Service: Cardiovascular;  Laterality: N/A;    Social History   Socioeconomic History  . Marital status: Married    Spouse name: Not on file  . Number of children: Not on file  . Years of education: Not on file  . Highest education level: Not on file  Occupational History  . Not on file  Tobacco Use  . Smoking status: Never Smoker  . Smokeless tobacco: Never Used  Substance and Sexual Activity  . Alcohol use: Yes    Alcohol/week: 0.0 - 2.0 standard drinks    Comment: occasional  . Drug use: Never  . Sexual activity: Yes  Other Topics Concern  . Not on file  Social History Narrative  . Not on file   Social Determinants of Health   Financial Resource Strain:   . Difficulty of Paying Living Expenses:   Food Insecurity:   . Worried About Charity fundraiser in the Last Year:   . Arboriculturist in the Last Year:   Transportation Needs:   . Film/video editor (Medical):   Marland Kitchen Lack of Transportation (Non-Medical):   Physical Activity:   . Days of Exercise per Week:   . Minutes of Exercise per Session:   Stress:   . Feeling of Stress :   Social Connections:   . Frequency of Communication with Friends and Family:   . Frequency of Social Gatherings with Friends  and Family:   . Attends Religious Services:   . Active Member of Clubs or Organizations:   . Attends Archivist Meetings:   Marland Kitchen Marital Status:   Intimate Partner Violence:   . Fear of Current or Ex-Partner:   . Emotionally Abused:   Marland Kitchen Physically Abused:   . Sexually Abused:     Family History  Problem Relation Age of Onset  . Diabetes Brother      Current Outpatient Medications:  .  ADVAIR DISKUS 100-50 MCG/DOSE AEPB, Inhale 1 puff into the lungs 2 (two) times daily., Disp: , Rfl:  .  albuterol (VENTOLIN HFA) 108 (90 Base) MCG/ACT inhaler, 2 puffs q.i.d. p.r.n. short of breath, wheezing, or cough, Disp: , Rfl:  .  apixaban (ELIQUIS) 5 MG TABS tablet, Take 1 tablet (5 mg total) by mouth 2 (two) times daily., Disp: 60 tablet, Rfl: 11 .  benzonatate (TESSALON) 200 MG capsule, Take by mouth., Disp: , Rfl:  .  dextromethorphan-guaiFENesin (MUCINEX DM) 30-600 MG 12hr tablet, Take 1 tablet by mouth 2 (two) times daily as needed for cough., Disp: , Rfl:  .  glimepiride (AMARYL) 4 MG tablet, Take 4 mg by mouth daily with breakfast., Disp: , Rfl:  .  lidocaine-prilocaine (EMLA) cream, Apply 1 application topically as needed., Disp:  30 g, Rfl: 0 .  moxifloxacin (AVELOX) 400 MG tablet, Take by mouth., Disp: , Rfl:  .  prochlorperazine (COMPAZINE) 10 MG tablet, Take 1 tablet (10 mg total) by mouth every 6 (six) hours as needed (Nausea or vomiting). (Patient not taking: Reported on 08/15/2019), Disp: 30 tablet, Rfl: 1  Physical exam:  Vitals:   08/15/19 0837  BP: 117/79  Pulse: 83  Resp: 18  Temp: (!) 97.5 F (36.4 C)  Weight: 192 lb 14.4 oz (87.5 kg)   Physical Exam Constitutional:      General: He is not in acute distress. HENT:     Head: Normocephalic and atraumatic.  Eyes:     General: No scleral icterus. Cardiovascular:     Rate and Rhythm: Normal rate and regular rhythm.     Heart sounds: Normal heart sounds.  Pulmonary:     Effort: Pulmonary effort is normal. No  respiratory distress.     Breath sounds: No wheezing.  Abdominal:     General: Bowel sounds are normal. There is no distension.     Palpations: Abdomen is soft.  Musculoskeletal:        General: No deformity. Normal range of motion.     Cervical back: Normal range of motion and neck supple.  Skin:    General: Skin is warm and dry.     Findings: No erythema or rash.  Neurological:     Mental Status: He is alert and oriented to person, place, and time. Mental status is at baseline.     Cranial Nerves: No cranial nerve deficit.     Coordination: Coordination normal.  Psychiatric:        Mood and Affect: Mood normal.     CMP Latest Ref Rng & Units 08/01/2019  Glucose 70 - 99 mg/dL 202(H)  BUN 8 - 23 mg/dL 17  Creatinine 0.61 - 1.24 mg/dL 0.66  Sodium 135 - 145 mmol/L 139  Potassium 3.5 - 5.1 mmol/L 3.7  Chloride 98 - 111 mmol/L 109  CO2 22 - 32 mmol/L 24  Calcium 8.9 - 10.3 mg/dL 8.7(L)  Total Protein 6.5 - 8.1 g/dL 6.2(L)  Total Bilirubin 0.3 - 1.2 mg/dL 0.5  Alkaline Phos 38 - 126 U/L 45  AST 15 - 41 U/L 23  ALT 0 - 44 U/L 26   CBC Latest Ref Rng & Units 08/15/2019  WBC 4.0 - 10.5 K/uL 9.9  Hemoglobin 13.0 - 17.0 g/dL 14.2  Hematocrit 39.0 - 52.0 % 40.9  Platelets 150 - 400 K/uL 122(L)   RADIOGRAPHIC STUDIES: I have personally reviewed the radiological images as listed and agreed with the findings in the report. CT ANGIO CHEST PE W OR WO CONTRAST  Result Date: 07/25/2019 CLINICAL DATA:  Shortness of breath for months, history of lymphoma on chemotherapy, pulmonary embolism EXAM: CT ANGIOGRAPHY CHEST WITH CONTRAST TECHNIQUE: Multidetector CT imaging of the chest was performed using the standard protocol during bolus administration of intravenous contrast. Multiplanar CT image reconstructions and MIPs were obtained to evaluate the vascular anatomy. CONTRAST:  73m OMNIPAQUE IOHEXOL 350 MG/ML SOLN IV COMPARISON:  01/29/2019 FINDINGS: Cardiovascular: Aorta normal caliber without  aneurysm or dissection. Heart unremarkable. No pericardial effusion. Pulmonary arteries well opacified. Small linear filling defect identified in LEFT lower lobe pulmonary artery consistent with a synechia, sequela of prior embolus; patient had a large pulmonary embolus in LEFT lower lobe pulmonary artery on prior study which has otherwise resolved. No new pulmonary emboli identified. Mediastinum/Nodes: Base of cervical region  normal appearance. Esophagus unremarkable. No thoracic adenopathy. Lungs/Pleura: Dependent atelectasis in BILATERAL lower lobes. Remaining lungs clear. No acute infiltrate, pleural effusion or pneumothorax. Upper Abdomen: Post cholecystectomy. Visualized upper abdomen otherwise unremarkable. Musculoskeletal: Mild osseous demineralization. No focal osseous abnormalities. Review of the MIP images confirms the above findings. IMPRESSION: Small linear filling defect in LEFT lower lobe pulmonary artery consistent with a synechia, sequela of prior pulmonary embolism; patient had a large pulmonary embolus in LEFT lower lobe pulmonary artery on prior study which has otherwise resolved. No new pulmonary emboli identified. Dependent atelectasis in BILATERAL lower lobes. Electronically Signed   By: Lavonia Dana M.D.   On: 07/25/2019 17:12   DG Chest Port 1 View  Result Date: 07/29/2019 CLINICAL DATA:  Fever and chills. Shortness of breath for 2 days. EXAM: PORTABLE CHEST 1 VIEW COMPARISON:  01/29/2019 chest x-ray and chest CT 07/25/2019 FINDINGS: Right IJ power port in good position without complicating features. Stable cardiac silhouette, mediastinal and hilar contours. Marked eventration/elevation of the right hemidiaphragm with overlying vascular crowding and atelectasis. Minimal streaky left basilar atelectasis. No definite infiltrates or effusions. No pneumothorax. The bony thorax is intact. IMPRESSION: 1. Marked eventration/elevation of the right hemidiaphragm with overlying vascular crowding  and atelectasis. 2. Minimal streaky left basilar atelectasis. Electronically Signed   By: Marijo Sanes M.D.   On: 07/29/2019 11:18     Assessment and plan 1. Follicular lymphoma of intra-abdominal lymph nodes, unspecified follicular lymphoma type ([place])   2. History of pulmonary embolism   3. History of DVT (deep vein thrombosis)   4. Viral pneumonia    Stage III follicular lymphoma Labs reviewed and discussed with patient.  Hold off chemotherapy given that he is still on antibiotics for pneumonia.  Shortness of breath is likely secondary to viral pneumonia/Covid.  Patient had chest x-ray done at [facility] clinic and results were not available to me PCP's note was reviewed.  Chest x-ray without discrete infiltrate. Advised patient to continue supportive care including antibiotics, expectorant steroids, albuterol.  Fluids and rest.  #History of DVT and PE, continue Eliquis for anticoagulation. Follow-up in 2 week for evaluation prior to cycle 6 BR.   Earlie Server, MD, PhD Hematology Oncology [facility] Pager- [phone] 08/15/2019

## 2019-08-16 ENCOUNTER — Inpatient Hospital Stay: Payer: BC Managed Care – PPO

## 2019-08-22 NOTE — Progress Notes (Signed)

## 2019-08-29 ENCOUNTER — Inpatient Hospital Stay: Payer: BC Managed Care – PPO

## 2019-08-29 ENCOUNTER — Encounter: Payer: Self-pay | Admitting: Oncology

## 2019-08-29 ENCOUNTER — Other Ambulatory Visit: Payer: Self-pay

## 2019-08-29 ENCOUNTER — Inpatient Hospital Stay (HOSPITAL_BASED_OUTPATIENT_CLINIC_OR_DEPARTMENT_OTHER): Payer: BC Managed Care – PPO | Admitting: Oncology

## 2019-08-29 VITALS — BP 125/85 | HR 88 | Temp 97.2°F | Resp 18 | Wt 194.9 lb

## 2019-08-29 VITALS — BP 104/70 | HR 89 | Temp 98.9°F | Resp 18

## 2019-08-29 DIAGNOSIS — C8293 Follicular lymphoma, unspecified, intra-abdominal lymph nodes: Secondary | ICD-10-CM

## 2019-08-29 DIAGNOSIS — Z95828 Presence of other vascular implants and grafts: Secondary | ICD-10-CM | POA: Diagnosis not present

## 2019-08-29 DIAGNOSIS — Z86718 Personal history of other venous thrombosis and embolism: Secondary | ICD-10-CM

## 2019-08-29 DIAGNOSIS — Z86711 Personal history of pulmonary embolism: Secondary | ICD-10-CM | POA: Diagnosis not present

## 2019-08-29 DIAGNOSIS — Z5111 Encounter for antineoplastic chemotherapy: Secondary | ICD-10-CM

## 2019-08-29 DIAGNOSIS — C8280 Other types of follicular lymphoma, unspecified site: Secondary | ICD-10-CM

## 2019-08-29 LAB — CBC WITH DIFFERENTIAL/PLATELET
Abs Immature Granulocytes: 0.05 10*3/uL (ref 0.00–0.07)
Basophils Absolute: 0 10*3/uL (ref 0.0–0.1)
Basophils Relative: 0 %
Eosinophils Absolute: 0 10*3/uL (ref 0.0–0.5)
Eosinophils Relative: 1 %
HCT: 37.9 % — ABNORMAL LOW (ref 39.0–52.0)
Hemoglobin: 12.8 g/dL — ABNORMAL LOW (ref 13.0–17.0)
Immature Granulocytes: 1 %
Lymphocytes Relative: 32 %
Lymphs Abs: 1.7 10*3/uL (ref 0.7–4.0)
MCH: 29.1 pg (ref 26.0–34.0)
MCHC: 33.8 g/dL (ref 30.0–36.0)
MCV: 86.1 fL (ref 80.0–100.0)
Monocytes Absolute: 0.5 10*3/uL (ref 0.1–1.0)
Monocytes Relative: 9 %
Neutro Abs: 3.1 10*3/uL (ref 1.7–7.7)
Neutrophils Relative %: 57 %
Platelets: 236 10*3/uL (ref 150–400)
RBC: 4.4 MIL/uL (ref 4.22–5.81)
RDW: 12.6 % (ref 11.5–15.5)
WBC: 5.4 10*3/uL (ref 4.0–10.5)
nRBC: 0 % (ref 0.0–0.2)

## 2019-08-29 LAB — COMPREHENSIVE METABOLIC PANEL
ALT: 42 U/L (ref 0–44)
AST: 34 U/L (ref 15–41)
Albumin: 3.7 g/dL (ref 3.5–5.0)
Alkaline Phosphatase: 60 U/L (ref 38–126)
Anion gap: 10 (ref 5–15)
BUN: 17 mg/dL (ref 8–23)
CO2: 24 mmol/L (ref 22–32)
Calcium: 8.8 mg/dL — ABNORMAL LOW (ref 8.9–10.3)
Chloride: 99 mmol/L (ref 98–111)
Creatinine, Ser: 0.96 mg/dL (ref 0.61–1.24)
GFR calc Af Amer: >60 mL/min (ref >60)
GFR calc non Af Amer: >60 mL/min (ref >60)
Glucose, Bld: 162 mg/dL — ABNORMAL HIGH (ref 70–99)
Potassium: 4.2 mmol/L (ref 3.5–5.1)
Sodium: 133 mmol/L — ABNORMAL LOW (ref 135–145)
Total Bilirubin: 0.7 mg/dL (ref 0.3–1.2)
Total Protein: 7 g/dL (ref 6.5–8.1)

## 2019-08-29 MED ORDER — SODIUM CHLORIDE 0.9 % IV SOLN
90.0000 mg/m2 | Freq: Once | INTRAVENOUS | Status: AC
  Administered 2019-08-29: 12:00:00 200 mg via INTRAVENOUS
  Filled 2019-08-29: qty 8

## 2019-08-29 MED ORDER — SODIUM CHLORIDE 0.9 % IV SOLN
375.0000 mg/m2 | Freq: Once | INTRAVENOUS | Status: AC
  Administered 2019-08-29: 800 mg via INTRAVENOUS
  Filled 2019-08-29: qty 50

## 2019-08-29 MED ORDER — DIPHENHYDRAMINE HCL 25 MG PO CAPS
50.0000 mg | ORAL_CAPSULE | Freq: Once | ORAL | Status: AC
  Administered 2019-08-29: 50 mg via ORAL
  Filled 2019-08-29: qty 2

## 2019-08-29 MED ORDER — HEPARIN SOD (PORK) LOCK FLUSH 100 UNIT/ML IV SOLN
500.0000 [IU] | Freq: Once | INTRAVENOUS | Status: AC | PRN
  Administered 2019-08-29: 500 [IU]
  Filled 2019-08-29: qty 5

## 2019-08-29 MED ORDER — DEXAMETHASONE SODIUM PHOSPHATE 10 MG/ML IJ SOLN
10.0000 mg | Freq: Once | INTRAMUSCULAR | Status: AC
  Administered 2019-08-29: 12:00:00 10 mg via INTRAVENOUS
  Filled 2019-08-29: qty 1

## 2019-08-29 MED ORDER — ACETAMINOPHEN 325 MG PO TABS
650.0000 mg | ORAL_TABLET | Freq: Once | ORAL | Status: AC
  Administered 2019-08-29: 650 mg via ORAL
  Filled 2019-08-29: qty 2

## 2019-08-29 MED ORDER — HEPARIN SOD (PORK) LOCK FLUSH 100 UNIT/ML IV SOLN
INTRAVENOUS | Status: AC
  Filled 2019-08-29: qty 5

## 2019-08-29 MED ORDER — PALONOSETRON HCL INJECTION 0.25 MG/5ML
0.2500 mg | Freq: Once | INTRAVENOUS | Status: AC
  Administered 2019-08-29: 0.25 mg via INTRAVENOUS
  Filled 2019-08-29: qty 5

## 2019-08-29 MED ORDER — SODIUM CHLORIDE 0.9 % IV SOLN
Freq: Once | INTRAVENOUS | Status: AC
  Filled 2019-08-29: qty 250

## 2019-08-29 NOTE — Progress Notes (Signed)
Patient here for follow up. No changes since last visit.   

## 2019-08-29 NOTE — Progress Notes (Signed)
Hematology/Oncology Follow Up Note [facility]  Telephone:([phone] [phone]  Patient Care Team: Rusty Aus, MD as PCP - General (Internal Medicine)   Name of the patient: Edward Atkinson  [id number]  November 18, 1960   REASON FOR VISIT  follow-up for management of pulmonary embolism, follicular cell lymphoma.  PERTINENT ONCOLOGY HISTORY # 01/31/2019 Unprovoked PE,  CT chest PE protocol reviewed bilateral massive pulmonary embolism with right heart strain and patient was started on heparin drip.  Patient was tachypneic and tachycardia.  Vascular surgeon was consulted and the patient was taken to the OR for thrombectomy and thrombolysis.  Patient also had selective catheter placement right upper lobe, middle lobe, lower lobe pulmonary arteries, left upper and lower pulmonary arteries. Ultrasound venous duplex bilateral showed DVT of the left lower extremity with nonocclusive thrombus in the left common femoral vein.  Some of the common femoral venous thrombus does extend just into the GSV across SF J.  No evidence of right lower extremity DVT. Patient was started on heparin drip for anticoagulation. Patient is status post embolectomy by vascular surgery. #Additional CT pancreas was done during that admission which showed peripancreatic/retroperitoneal and contiguous small bowel mesentery nodal mass, retroperitoneal adenopathy, gastrohepatic ligament lymphadenopathy, left abdominal omental nodule CT-guided omentum biopsy was done during hospital.  Patient was switched to Lovenox at discharge.  #  PET scan done on 02/11/2019 which showed large conglomerate retroperitoneal and mesenteric mass with maximal SUV 10.6 compatible with Deauville 5 disease.  Deauville 4 adenopathy in the pelvis. A left axillary lymph node measuring 0.7 cm in short axis has a maximum SUV of 3.3. Mild splenomegaly without focal splenic lesion identified. #02/21/2019, bone marrow biopsy  showed no lymphoma involvement.  There might be lymphocyte aggregates that were being cut away on the deeper levels.  Additional IHC did not show residual echograms.  # admitted from 07/29/2019- 08/09/2019 due to COVID pneumonia. Treated with remdesivir and steroids.  He was discharged and still felt some shortness of breath and was seen by PCP Dr.Elston on 08/09/2019 He was given another course of antibiotics with moxifloxacin 400 mg daily for 10 days, Prednisone tapering and nebulizer.   INTERVAL HISTORY 59 y.o. male with newly diagnosed pulmonary embolism and follicular lymphoma present for follow-up-evaluation prior to cycle 6 BR Patient reports feeling better today.  Breathing is better except occasionally he has some "catchy feelings" in his chest.  He has an appointment to see pulmonology for further evaluation. Room air pulse ox 93 to 94%. He has gained 2 pounds since 2 weeks ago. Review of Systems  Constitutional: Negative for appetite change, chills, fatigue, fever and unexpected weight change.  HENT:   Negative for hearing loss and voice change.   Eyes: Negative for eye problems and icterus.  Respiratory: Negative for chest tightness, cough and shortness of breath.   Cardiovascular: Negative for chest pain and leg swelling.  Gastrointestinal: Negative for abdominal distention and abdominal pain.  Endocrine: Negative for hot flashes.  Genitourinary: Negative for difficulty urinating, dysuria and frequency.   Musculoskeletal: Negative for arthralgias.  Skin: Negative for itching and rash.  Neurological: Negative for light-headedness and numbness.  Hematological: Negative for adenopathy. Does not bruise/bleed easily.  Psychiatric/Behavioral: Negative for confusion.      Allergies  Allergen Reactions  . Penicillins Rash    Did it involve swelling of the face/tongue/throat, SOB, or low BP? No Did it involve sudden or severe rash/hives, skin peeling, or any reaction on the  inside of  your mouth or nose? No Did you need to seek medical attention at a hospital or doctor's office? No When did it last happen?Unknown If all above answers are "NO", may proceed with cephalosporin use.     Past Medical History:  Diagnosis Date  . Diabetes mellitus without complication ([place])   . Diverticulitis   . Follicular lymphoma of intra-abdominal lymph nodes ([place]) 02/15/2019     Past Surgical History:  Procedure Laterality Date  . CHOLECYSTECTOMY    . COLON RESECTION    . PORTA CATH INSERTION N/A 03/11/2019   Procedure: PORTA CATH INSERTION;  Surgeon: Algernon Huxley, MD;  Location: [facility];  Service: Cardiovascular;  Laterality: N/A;  . PULMONARY THROMBECTOMY N/A 01/29/2019   Procedure: PULMONARY THROMBECTOMY;  Surgeon: Algernon Huxley, MD;  Location: [facility];  Service: Cardiovascular;  Laterality: N/A;    Social History   Socioeconomic History  . Marital status: Married    Spouse name: Not on file  . Number of children: Not on file  . Years of education: Not on file  . Highest education level: Not on file  Occupational History  . Not on file  Tobacco Use  . Smoking status: Never Smoker  . Smokeless tobacco: Never Used  Substance and Sexual Activity  . Alcohol use: Yes    Alcohol/week: 0.0 - 2.0 standard drinks    Comment: occasional  . Drug use: Never  . Sexual activity: Yes  Other Topics Concern  . Not on file  Social History Narrative  . Not on file   Social Determinants of Health   Financial Resource Strain:   . Difficulty of Paying Living Expenses:   Food Insecurity:   . Worried About Charity fundraiser in the Last Year:   . Arboriculturist in the Last Year:   Transportation Needs:   . Film/video editor (Medical):   Marland Kitchen Lack of Transportation (Non-Medical):   Physical Activity:   . Days of Exercise per Week:   . Minutes of Exercise per Session:   Stress:   . Feeling of Stress :   Social Connections:   . Frequency of  Communication with Friends and Family:   . Frequency of Social Gatherings with Friends and Family:   . Attends Religious Services:   . Active Member of Clubs or Organizations:   . Attends Archivist Meetings:   Marland Kitchen Marital Status:   Intimate Partner Violence:   . Fear of Current or Ex-Partner:   . Emotionally Abused:   Marland Kitchen Physically Abused:   . Sexually Abused:     Family History  Problem Relation Age of Onset  . Diabetes Brother      Current Outpatient Medications:  .  ADVAIR DISKUS 100-50 MCG/DOSE AEPB, Inhale 1 puff into the lungs 2 (two) times daily., Disp: , Rfl:  .  albuterol (VENTOLIN HFA) 108 (90 Base) MCG/ACT inhaler, 2 puffs q.i.d. p.r.n. short of breath, wheezing, or cough, Disp: , Rfl:  .  apixaban (ELIQUIS) 5 MG TABS tablet, Take 1 tablet (5 mg total) by mouth 2 (two) times daily., Disp: 60 tablet, Rfl: 11 .  dextromethorphan-guaiFENesin (MUCINEX DM) 30-600 MG 12hr tablet, Take 1 tablet by mouth 2 (two) times daily as needed for cough., Disp: , Rfl:  .  glimepiride (AMARYL) 4 MG tablet, Take 4 mg by mouth daily with breakfast., Disp: , Rfl:  .  lidocaine-prilocaine (EMLA) cream, Apply 1 application topically as  needed., Disp: 30 g, Rfl: 0 .  prochlorperazine (COMPAZINE) 10 MG tablet, Take 1 tablet (10 mg total) by mouth every 6 (six) hours as needed (Nausea or vomiting)., Disp: 30 tablet, Rfl: 1  Physical exam:  Vitals:   08/29/19 0829  BP: 125/85  Pulse: 88  Resp: 18  Temp: (!) 97.2 F (36.2 C)  SpO2: 94%  Weight: 194 lb 14.4 oz (88.4 kg)   Physical Exam Constitutional:      General: He is not in acute distress. HENT:     Head: Normocephalic and atraumatic.  Eyes:     General: No scleral icterus. Cardiovascular:     Rate and Rhythm: Normal rate and regular rhythm.     Heart sounds: Normal heart sounds.  Pulmonary:     Effort: Pulmonary effort is normal. No respiratory distress.     Breath sounds: No wheezing.     Comments: Bilateral basilar  crackles Abdominal:     General: Bowel sounds are normal. There is no distension.     Palpations: Abdomen is soft.  Musculoskeletal:        General: No deformity. Normal range of motion.     Cervical back: Normal range of motion and neck supple.  Skin:    General: Skin is warm and dry.     Findings: No erythema or rash.  Neurological:     Mental Status: He is alert and oriented to person, place, and time. Mental status is at baseline.     Cranial Nerves: No cranial nerve deficit.     Coordination: Coordination normal.  Psychiatric:        Mood and Affect: Mood normal.     CMP Latest Ref Rng & Units 08/29/2019  Glucose 70 - 99 mg/dL 162(H)  BUN 8 - 23 mg/dL 17  Creatinine 0.61 - 1.24 mg/dL 0.96  Sodium 135 - 145 mmol/L 133(L)  Potassium 3.5 - 5.1 mmol/L 4.2  Chloride 98 - 111 mmol/L 99  CO2 22 - 32 mmol/L 24  Calcium 8.9 - 10.3 mg/dL 8.8(L)  Total Protein 6.5 - 8.1 g/dL 7.0  Total Bilirubin 0.3 - 1.2 mg/dL 0.7  Alkaline Phos 38 - 126 U/L 60  AST 15 - 41 U/L 34  ALT 0 - 44 U/L 42   CBC Latest Ref Rng & Units 08/29/2019  WBC 4.0 - 10.5 K/uL 5.4  Hemoglobin 13.0 - 17.0 g/dL 12.8(L)  Hematocrit 39.0 - 52.0 % 37.9(L)  Platelets 150 - 400 K/uL 236   RADIOGRAPHIC STUDIES: I have personally reviewed the radiological images as listed and agreed with the findings in the report. CT ANGIO CHEST PE W OR WO CONTRAST  Result Date: 07/25/2019 CLINICAL DATA:  Shortness of breath for months, history of lymphoma on chemotherapy, pulmonary embolism EXAM: CT ANGIOGRAPHY CHEST WITH CONTRAST TECHNIQUE: Multidetector CT imaging of the chest was performed using the standard protocol during bolus administration of intravenous contrast. Multiplanar CT image reconstructions and MIPs were obtained to evaluate the vascular anatomy. CONTRAST:  79m OMNIPAQUE IOHEXOL 350 MG/ML SOLN IV COMPARISON:  01/29/2019 FINDINGS: Cardiovascular: Aorta normal caliber without aneurysm or dissection. Heart unremarkable.  No pericardial effusion. Pulmonary arteries well opacified. Small linear filling defect identified in LEFT lower lobe pulmonary artery consistent with a synechia, sequela of prior embolus; patient had a large pulmonary embolus in LEFT lower lobe pulmonary artery on prior study which has otherwise resolved. No new pulmonary emboli identified. Mediastinum/Nodes: Base of cervical region normal appearance. Esophagus unremarkable. No thoracic adenopathy. Lungs/Pleura:  Dependent atelectasis in BILATERAL lower lobes. Remaining lungs clear. No acute infiltrate, pleural effusion or pneumothorax. Upper Abdomen: Post cholecystectomy. Visualized upper abdomen otherwise unremarkable. Musculoskeletal: Mild osseous demineralization. No focal osseous abnormalities. Review of the MIP images confirms the above findings. IMPRESSION: Small linear filling defect in LEFT lower lobe pulmonary artery consistent with a synechia, sequela of prior pulmonary embolism; patient had a large pulmonary embolus in LEFT lower lobe pulmonary artery on prior study which has otherwise resolved. No new pulmonary emboli identified. Dependent atelectasis in BILATERAL lower lobes. Electronically Signed   By: Lavonia Dana M.D.   On: 07/25/2019 17:12   DG Chest Port 1 View  Result Date: 07/29/2019 CLINICAL DATA:  Fever and chills. Shortness of breath for 2 days. EXAM: PORTABLE CHEST 1 VIEW COMPARISON:  01/29/2019 chest x-ray and chest CT 07/25/2019 FINDINGS: Right IJ power port in good position without complicating features. Stable cardiac silhouette, mediastinal and hilar contours. Marked eventration/elevation of the right hemidiaphragm with overlying vascular crowding and atelectasis. Minimal streaky left basilar atelectasis. No definite infiltrates or effusions. No pneumothorax. The bony thorax is intact. IMPRESSION: 1. Marked eventration/elevation of the right hemidiaphragm with overlying vascular crowding and atelectasis. 2. Minimal streaky left  basilar atelectasis. Electronically Signed   By: Marijo Sanes M.D.   On: 07/29/2019 11:18     Assessment and plan 1. Follicular lymphoma of intra-abdominal lymph nodes, unspecified follicular lymphoma type ([place])   2. History of pulmonary embolism   3. History of DVT (deep vein thrombosis)   4. Port-A-Cath in place   5. Encounter for antineoplastic chemotherapy    Stage III follicular lymphoma Labs reviewed and discussed with patient in details. Counts acceptable to proceed with cycle 6 of BR. He has mostly recovered from recent COVID-19 pneumonia and has been 3 weeks since his diagnosis. Discussed with patient that proceeding with chemotherapy may worsen his immune system. Patient understand the risk and he is willing to proceed with cycle 6 of BR for treatment of follicular cell lymphoma.  Close monitoring her symptoms.  Patient may have post viral pneumonia/Covid lung inflammation.. .  #History of DVT and PE, continue Eliquis for anticoagulation. I will obtain PET scan 10 weeks from today for evaluation of treatment response. Patient will follow up after PET scan for discussion of maintenance therapy.   Earlie Server, MD, PhD Hematology Oncology [facility] Pager- [phone] 08/29/2019

## 2019-08-30 ENCOUNTER — Inpatient Hospital Stay: Payer: BC Managed Care – PPO

## 2019-08-30 VITALS — BP 128/84 | HR 99 | Temp 97.2°F | Resp 20

## 2019-08-30 DIAGNOSIS — C8280 Other types of follicular lymphoma, unspecified site: Secondary | ICD-10-CM

## 2019-08-30 DIAGNOSIS — C8293 Follicular lymphoma, unspecified, intra-abdominal lymph nodes: Secondary | ICD-10-CM | POA: Diagnosis not present

## 2019-08-30 MED ORDER — HEPARIN SOD (PORK) LOCK FLUSH 100 UNIT/ML IV SOLN
500.0000 [IU] | Freq: Once | INTRAVENOUS | Status: AC
  Administered 2019-08-30: 500 [IU] via INTRAVENOUS
  Filled 2019-08-30: qty 5

## 2019-08-30 MED ORDER — SODIUM CHLORIDE 0.9 % IV SOLN
Freq: Once | INTRAVENOUS | Status: AC
  Filled 2019-08-30: qty 250

## 2019-08-30 MED ORDER — HEPARIN SOD (PORK) LOCK FLUSH 100 UNIT/ML IV SOLN
INTRAVENOUS | Status: AC
  Filled 2019-08-30: qty 5

## 2019-08-30 MED ORDER — SODIUM CHLORIDE 0.9 % IV SOLN
90.0000 mg/m2 | Freq: Once | INTRAVENOUS | Status: AC
  Administered 2019-08-30: 200 mg via INTRAVENOUS
  Filled 2019-08-30: qty 8

## 2019-08-30 MED ORDER — DEXAMETHASONE SODIUM PHOSPHATE 10 MG/ML IJ SOLN
10.0000 mg | Freq: Once | INTRAMUSCULAR | Status: AC
  Administered 2019-08-30: 10 mg via INTRAVENOUS
  Filled 2019-08-30: qty 1

## 2019-09-12 ENCOUNTER — Other Ambulatory Visit: Payer: BC Managed Care – PPO

## 2019-09-12 ENCOUNTER — Ambulatory Visit: Payer: BC Managed Care – PPO | Admitting: Oncology

## 2019-09-12 ENCOUNTER — Ambulatory Visit: Payer: BC Managed Care – PPO

## 2019-09-13 ENCOUNTER — Ambulatory Visit: Payer: BC Managed Care – PPO

## 2019-10-22 ENCOUNTER — Inpatient Hospital Stay: Payer: BC Managed Care – PPO | Attending: Oncology

## 2019-10-22 ENCOUNTER — Other Ambulatory Visit: Payer: Self-pay

## 2019-10-22 DIAGNOSIS — C8293 Follicular lymphoma, unspecified, intra-abdominal lymph nodes: Secondary | ICD-10-CM | POA: Diagnosis not present

## 2019-10-22 DIAGNOSIS — Z95828 Presence of other vascular implants and grafts: Secondary | ICD-10-CM

## 2019-10-22 MED ORDER — SODIUM CHLORIDE 0.9% FLUSH
10.0000 mL | INTRAVENOUS | Status: DC | PRN
  Administered 2019-10-22: 10 mL via INTRAVENOUS
  Filled 2019-10-22: qty 10

## 2019-10-22 MED ORDER — HEPARIN SOD (PORK) LOCK FLUSH 100 UNIT/ML IV SOLN
500.0000 [IU] | Freq: Once | INTRAVENOUS | Status: AC
  Administered 2019-10-22: 500 [IU] via INTRAVENOUS
  Filled 2019-10-22: qty 5

## 2019-11-07 ENCOUNTER — Ambulatory Visit
Admission: RE | Admit: 2019-11-07 | Discharge: 2019-11-07 | Disposition: A | Discharge status: Home / Self Care | Payer: BC Managed Care – PPO | Source: Ambulatory Visit | Attending: Oncology | Admitting: Oncology

## 2019-11-07 ENCOUNTER — Other Ambulatory Visit: Payer: Self-pay

## 2019-11-07 DIAGNOSIS — Z8616 Personal history of COVID-19: Secondary | ICD-10-CM | POA: Diagnosis not present

## 2019-11-07 DIAGNOSIS — J984 Other disorders of lung: Secondary | ICD-10-CM | POA: Insufficient documentation

## 2019-11-07 DIAGNOSIS — C8293 Follicular lymphoma, unspecified, intra-abdominal lymph nodes: Secondary | ICD-10-CM | POA: Insufficient documentation

## 2019-11-07 LAB — GLUCOSE, CAPILLARY: Glucose-Capillary: 101 mg/dL — ABNORMAL HIGH (ref 70–99)

## 2019-11-07 MED ORDER — FLUDEOXYGLUCOSE F - 18 (FDG) INJECTION
10.1000 | Freq: Once | INTRAVENOUS | Status: AC | PRN
  Administered 2019-11-07: 10.467 via INTRAVENOUS

## 2019-11-11 ENCOUNTER — Other Ambulatory Visit: Payer: Self-pay

## 2019-11-11 ENCOUNTER — Encounter: Payer: Self-pay | Admitting: Oncology

## 2019-11-11 ENCOUNTER — Inpatient Hospital Stay: Payer: BC Managed Care – PPO | Attending: Oncology | Admitting: Oncology

## 2019-11-11 ENCOUNTER — Inpatient Hospital Stay: Payer: BC Managed Care – PPO

## 2019-11-11 VITALS — BP 115/82 | HR 116 | Temp 97.2°F | Resp 20 | Wt 180.4 lb

## 2019-11-11 DIAGNOSIS — Z8616 Personal history of COVID-19: Secondary | ICD-10-CM | POA: Insufficient documentation

## 2019-11-11 DIAGNOSIS — Z7984 Long term (current) use of oral hypoglycemic drugs: Secondary | ICD-10-CM | POA: Diagnosis not present

## 2019-11-11 DIAGNOSIS — Z79899 Other long term (current) drug therapy: Secondary | ICD-10-CM | POA: Diagnosis not present

## 2019-11-11 DIAGNOSIS — C8293 Follicular lymphoma, unspecified, intra-abdominal lymph nodes: Secondary | ICD-10-CM

## 2019-11-11 DIAGNOSIS — C8280 Other types of follicular lymphoma, unspecified site: Secondary | ICD-10-CM

## 2019-11-11 DIAGNOSIS — Z86711 Personal history of pulmonary embolism: Secondary | ICD-10-CM | POA: Diagnosis not present

## 2019-11-11 DIAGNOSIS — J129 Viral pneumonia, unspecified: Secondary | ICD-10-CM

## 2019-11-11 DIAGNOSIS — E119 Type 2 diabetes mellitus without complications: Secondary | ICD-10-CM | POA: Diagnosis not present

## 2019-11-11 DIAGNOSIS — Z8701 Personal history of pneumonia (recurrent): Secondary | ICD-10-CM | POA: Diagnosis not present

## 2019-11-11 DIAGNOSIS — Z86718 Personal history of other venous thrombosis and embolism: Secondary | ICD-10-CM | POA: Insufficient documentation

## 2019-11-11 DIAGNOSIS — C8223 Follicular lymphoma grade III, unspecified, intra-abdominal lymph nodes: Secondary | ICD-10-CM | POA: Insufficient documentation

## 2019-11-11 DIAGNOSIS — R161 Splenomegaly, not elsewhere classified: Secondary | ICD-10-CM | POA: Insufficient documentation

## 2019-11-11 LAB — COMPREHENSIVE METABOLIC PANEL
ALT: 22 U/L (ref 0–44)
AST: 26 U/L (ref 15–41)
Albumin: 3.7 g/dL (ref 3.5–5.0)
Alkaline Phosphatase: 62 U/L (ref 38–126)
Anion gap: 12 (ref 5–15)
BUN: 8 mg/dL (ref 8–23)
CO2: 25 mmol/L (ref 22–32)
Calcium: 9.3 mg/dL (ref 8.9–10.3)
Chloride: 100 mmol/L (ref 98–111)
Creatinine, Ser: 0.83 mg/dL (ref 0.61–1.24)
GFR calc Af Amer: >60 mL/min (ref >60)
GFR calc non Af Amer: >60 mL/min (ref >60)
Glucose, Bld: 102 mg/dL — ABNORMAL HIGH (ref 70–99)
Potassium: 3.6 mmol/L (ref 3.5–5.1)
Sodium: 137 mmol/L (ref 135–145)
Total Bilirubin: 0.5 mg/dL (ref 0.3–1.2)
Total Protein: 7.7 g/dL (ref 6.5–8.1)

## 2019-11-11 LAB — CBC WITH DIFFERENTIAL/PLATELET
Abs Immature Granulocytes: 0.05 10*3/uL (ref 0.00–0.07)
Basophils Absolute: 0 10*3/uL (ref 0.0–0.1)
Basophils Relative: 0 %
Eosinophils Absolute: 0 10*3/uL (ref 0.0–0.5)
Eosinophils Relative: 0 %
HCT: 38 % — ABNORMAL LOW (ref 39.0–52.0)
Hemoglobin: 12.5 g/dL — ABNORMAL LOW (ref 13.0–17.0)
Immature Granulocytes: 1 %
Lymphocytes Relative: 12 %
Lymphs Abs: 0.7 10*3/uL (ref 0.7–4.0)
MCH: 27.7 pg (ref 26.0–34.0)
MCHC: 32.9 g/dL (ref 30.0–36.0)
MCV: 84.1 fL (ref 80.0–100.0)
Monocytes Absolute: 0.6 10*3/uL (ref 0.1–1.0)
Monocytes Relative: 10 %
Neutro Abs: 4.6 10*3/uL (ref 1.7–7.7)
Neutrophils Relative %: 77 %
Platelets: 318 10*3/uL (ref 150–400)
RBC: 4.52 MIL/uL (ref 4.22–5.81)
RDW: 14.8 % (ref 11.5–15.5)
WBC: 6 10*3/uL (ref 4.0–10.5)
nRBC: 0 % (ref 0.0–0.2)

## 2019-11-11 MED ORDER — APIXABAN 2.5 MG PO TABS
2.5000 mg | ORAL_TABLET | Freq: Two times a day (BID) | ORAL | 3 refills | Status: DC
Start: 2019-11-11 — End: 2019-12-25

## 2019-11-11 NOTE — Progress Notes (Signed)
Hematology/Oncology Follow Up Note [facility]  Telephone:([phone] [phone]  Patient Care Team: Rusty Aus, MD as PCP - General (Internal Medicine)   Name of the patient: Edward Atkinson  [id number]  [birth year]   REASON FOR VISIT  follow-up for management of pulmonary embolism, follicular cell lymphoma.  PERTINENT ONCOLOGY HISTORY # 01/31/2019 Unprovoked PE,  CT chest PE protocol reviewed bilateral massive pulmonary embolism with right heart strain and patient was started on heparin drip.  Patient was tachypneic and tachycardia.  Vascular surgeon was consulted and the patient was taken to the OR for thrombectomy and thrombolysis.  Patient also had selective catheter placement right upper lobe, middle lobe, lower lobe pulmonary arteries, left upper and lower pulmonary arteries. Ultrasound venous duplex bilateral showed DVT of the left lower extremity with nonocclusive thrombus in the left common femoral vein.  Some of the common femoral venous thrombus does extend just into the GSV across SF J.  No evidence of right lower extremity DVT. Patient was started on heparin drip for anticoagulation. Patient is status post embolectomy by vascular surgery. #Additional CT pancreas was done during that admission which showed peripancreatic/retroperitoneal and contiguous small bowel mesentery nodal mass, retroperitoneal adenopathy, gastrohepatic ligament lymphadenopathy, left abdominal omental nodule CT-guided omentum biopsy was done during hospital.  Patient was switched to Lovenox at discharge.  #  PET scan done on 02/11/2019 which showed large conglomerate retroperitoneal and mesenteric mass with maximal SUV 10.6 compatible with Deauville 5 disease.  Deauville 4 adenopathy in the pelvis. A left axillary lymph node measuring 0.7 cm in short axis has a maximum SUV of 3.3. Mild splenomegaly without focal splenic lesion identified. #02/21/2019, bone marrow biopsy  showed no lymphoma involvement.  There might be lymphocyte aggregates that were being cut away on the deeper levels.  Additional IHC did not show residual echograms.  # admitted from 07/29/2019- 08/09/2019 due to COVID pneumonia. Treated with remdesivir and steroids.  He was discharged and still felt some shortness of breath and was seen by PCP Dr.Elston on 08/09/2019 He was given another course of antibiotics with moxifloxacin 400 mg daily for 10 days, Prednisone tapering and nebulizer.  #Status post 6 cycles of BR.  INTERVAL HISTORY 59 y.o. male with newly diagnosed pulmonary embolism and follicular lymphoma present for follow-up-evaluation  Patient has completed 6 cycles of BR.  Treatment was interrupted during Covid infection March. Patient continues to have intermittent cough spells.  Shortness of breath with exertion.  Denies any fever, chills. Patient had PET scan done recently and present to discuss results and future plans.  Review of Systems  Constitutional: Negative for appetite change, chills, fatigue, fever and unexpected weight change.  HENT:   Negative for hearing loss and voice change.   Eyes: Negative for eye problems and icterus.  Respiratory: Positive for cough and shortness of breath. Negative for chest tightness.   Cardiovascular: Negative for chest pain and leg swelling.  Gastrointestinal: Negative for abdominal distention and abdominal pain.  Endocrine: Negative for hot flashes.  Genitourinary: Negative for difficulty urinating, dysuria and frequency.   Musculoskeletal: Negative for arthralgias.  Skin: Negative for itching and rash.  Neurological: Negative for light-headedness and numbness.  Hematological: Negative for adenopathy. Does not bruise/bleed easily.  Psychiatric/Behavioral: Negative for confusion.      Allergies  Allergen Reactions  . Penicillins Rash    Did it involve swelling of the face/tongue/throat, SOB, or low BP? No Did it involve sudden or  severe rash/hives,  skin peeling, or any reaction on the inside of your mouth or nose? No Did you need to seek medical attention at a hospital or doctor's office? No When did it last happen?Unknown If all above answers are "NO", may proceed with cephalosporin use.     Past Medical History:  Diagnosis Date  . Diabetes mellitus without complication ([place])   . Diverticulitis   . Follicular lymphoma of intra-abdominal lymph nodes ([place]) 02/15/2019     Past Surgical History:  Procedure Laterality Date  . CHOLECYSTECTOMY    . COLON RESECTION    . PORTA CATH INSERTION N/A 03/11/2019   Procedure: PORTA CATH INSERTION;  Surgeon: Algernon Huxley, MD;  Location: [place] [facility];  Service: Cardiovascular;  Laterality: N/A;  . PULMONARY THROMBECTOMY N/A 01/29/2019   Procedure: PULMONARY THROMBECTOMY;  Surgeon: Algernon Huxley, MD;  Location: [place] [facility];  Service: Cardiovascular;  Laterality: N/A;    Social History   Socioeconomic History  . Marital status: Married    Spouse name: Not on file  . Number of children: Not on file  . Years of education: Not on file  . Highest education level: Not on file  Occupational History  . Not on file  Tobacco Use  . Smoking status: Never Smoker  . Smokeless tobacco: Never Used  Vaping Use  . Vaping Use: Never used  Substance and Sexual Activity  . Alcohol use: Yes    Alcohol/week: 0.0 - 2.0 standard drinks    Comment: occasional  . Drug use: Never  . Sexual activity: Yes  Other Topics Concern  . Not on file  Social History Narrative  . Not on file   Social Determinants of Health   Financial Resource Strain:   . Difficulty of Paying Living Expenses:   Food Insecurity:   . Worried About Charity fundraiser in the Last Year:   . Arboriculturist in the Last Year:   Transportation Needs:   . Film/video editor (Medical):   Marland Kitchen Lack of Transportation (Non-Medical):   Physical Activity:   . Days of Exercise per Week:   .  Minutes of Exercise per Session:   Stress:   . Feeling of Stress :   Social Connections:   . Frequency of Communication with Friends and Family:   . Frequency of Social Gatherings with Friends and Family:   . Attends Religious Services:   . Active Member of Clubs or Organizations:   . Attends Archivist Meetings:   Marland Kitchen Marital Status:   Intimate Partner Violence:   . Fear of Current or Ex-Partner:   . Emotionally Abused:   Marland Kitchen Physically Abused:   . Sexually Abused:     Family History  Problem Relation Age of Onset  . Diabetes Brother      Current Outpatient Medications:  .  albuterol (PROVENTIL) (2.5 MG/3ML) 0.083% nebulizer solution, Inhale into the lungs., Disp: , Rfl:  .  budesonide (PULMICORT) 1 MG/2ML nebulizer solution, Inhale into the lungs., Disp: , Rfl:  .  Colchicine (MITIGARE) 0.6 MG CAPS, Take 1 tablet by mouth daily., Disp: , Rfl:  .  dextromethorphan-guaiFENesin (MUCINEX DM) 30-600 MG 12hr tablet, Take 1 tablet by mouth 2 (two) times daily as needed for cough., Disp: , Rfl:  .  glimepiride (AMARYL) 4 MG tablet, Take 4 mg by mouth daily with breakfast., Disp: , Rfl:  .  lidocaine-prilocaine (EMLA) cream, Apply 1 application topically as needed., Disp: 30 g, Rfl:  0 .  metFORMIN (GLUCOPHAGE) 500 MG tablet, Take by mouth., Disp: , Rfl:  .  montelukast (SINGULAIR) 10 MG tablet, Take by mouth., Disp: , Rfl:  .  ADVAIR DISKUS 100-50 MCG/DOSE AEPB, Inhale 1 puff into the lungs 2 (two) times daily. (Patient not taking: Reported on 11/11/2019), Disp: , Rfl:  .  albuterol (VENTOLIN HFA) 108 (90 Base) MCG/ACT inhaler, 2 puffs q.i.d. p.r.n. short of breath, wheezing, or cough (Patient not taking: Reported on 11/11/2019), Disp: , Rfl:  .  apixaban (ELIQUIS) 2.5 MG TABS tablet, Take 1 tablet (2.5 mg total) by mouth 2 (two) times daily., Disp: 60 tablet, Rfl: 3 .  prochlorperazine (COMPAZINE) 10 MG tablet, Take 1 tablet (10 mg total) by mouth every 6 (six) hours as needed  (Nausea or vomiting). (Patient not taking: Reported on 11/11/2019), Disp: 30 tablet, Rfl: 1  Physical exam:  Vitals:   11/11/19 1017  BP: 115/82  Pulse: (!) 116  Resp: 20  Temp: (!) 97.2 F (36.2 C)  SpO2: 95%  Weight: 180 lb 6.4 oz (81.8 kg)   Physical Exam Constitutional:      General: He is not in acute distress. HENT:     Head: Normocephalic and atraumatic.  Eyes:     General: No scleral icterus. Cardiovascular:     Rate and Rhythm: Normal rate and regular rhythm.     Heart sounds: Normal heart sounds.  Pulmonary:     Effort: Pulmonary effort is normal. No respiratory distress.     Breath sounds: No wheezing.     Comments: Bilateral basilar crackles Abdominal:     General: Bowel sounds are normal. There is no distension.     Palpations: Abdomen is soft.  Musculoskeletal:        General: No deformity. Normal range of motion.     Cervical back: Normal range of motion and neck supple.  Skin:    General: Skin is warm and dry.     Findings: No erythema or rash.  Neurological:     Mental Status: He is alert and oriented to person, place, and time. Mental status is at baseline.     Cranial Nerves: No cranial nerve deficit.     Coordination: Coordination normal.  Psychiatric:        Mood and Affect: Mood normal.     CMP Latest Ref Rng & Units 11/11/2019  Glucose 70 - 99 mg/dL 102(H)  BUN 8 - 23 mg/dL 8  Creatinine 0.61 - 1.24 mg/dL 0.83  Sodium 135 - 145 mmol/L 137  Potassium 3.5 - 5.1 mmol/L 3.6  Chloride 98 - 111 mmol/L 100  CO2 22 - 32 mmol/L 25  Calcium 8.9 - 10.3 mg/dL 9.3  Total Protein 6.5 - 8.1 g/dL 7.7  Total Bilirubin 0.3 - 1.2 mg/dL 0.5  Alkaline Phos 38 - 126 U/L 62  AST 15 - 41 U/L 26  ALT 0 - 44 U/L 22   CBC Latest Ref Rng & Units 11/11/2019  WBC 4.0 - 10.5 K/uL 6.0  Hemoglobin 13.0 - 17.0 g/dL 12.5(L)  Hematocrit 39 - 52 % 38.0(L)  Platelets 150 - 400 K/uL 318   RADIOGRAPHIC STUDIES: I have personally reviewed the radiological images as  listed and agreed with the findings in the report. NM PET Image Initial (PI) Skull Base To Thigh  Result Date: 11/07/2019 CLINICAL DATA:  Subsequent treatment strategy for lymphoma. EXAM: NUCLEAR MEDICINE PET SKULL BASE TO THIGH TECHNIQUE: 10.5 mCi F-18 FDG was injected intravenously. Full-ring PET  imaging was performed from the skull base to thigh after the radiotracer. CT data was obtained and used for attenuation correction and anatomic localization. Fasting blood glucose: 101 mg/dl COMPARISON:  CT 04/21/2019 FINDINGS: Mediastinal blood pool activity: SUV max 2.1 Liver activity: SUV max 3.4 NECK: No hypermetabolic lymph nodes in the neck. Incidental CT findings: none CHEST: Foci of ground-glass opacity are noted in the lungs bilaterally, basilar predominant and right lung slightly more than left. This is compatible with the patient's reported clinical history of recent COVID infection. Soft tissue fullness in the hilar regions may be reactive and there is focal hypermetabolism in the inferior right hilum with SUV max = 6.0. Discrete nodular ground-glass opacity in the anterior right upper lobe seen on 75/3 demonstrates SUV max = 3.4. Representative uptake in more confluent ground-glass opacity in the posterior right lower lobe demonstrates SUV max = 4.8 (image 35/3) No hypermetabolic mediastinal or left hilar lymphadenopathy evident. Left axillary lymph node measuring 7 mm short axis with hypermetabolism on the previous PET-CT has completely resolved. Incidental CT findings: Right Port-A-Cath tip is positioned in the SVC/RA junction. ABDOMEN/PELVIS: Bulky lymphadenopathy/soft tissue in the left abdominal mesentery previously has decreased substantially in the interval this soft tissue was measured previously at 18 x 9 cm which compares to approximately 7 x 3 cm today. FDG accumulation within this tissue is also markedly decreased with SUV max = 2.9 today compared to 10.6 previously. The mesenteric and omental  nodularity seen on the previous exam is substantially improved. 14 mm short axis right common iliac node measured on the previous study has resolved completely in the interval. 2 cm short axis right distal external iliac/common femoral node measured previously now measures 0.4 cm short axis with no hypermetabolism on PET imaging. The right inguinal activity described previously has resolved completely in the interval. Incidental CT findings: No splenomegaly. Persistent stranding in the central mesentery and left renal hilum. Distal colonic anastomosis again noted. SKELETON: No focal hypermetabolic activity to suggest skeletal metastasis. Incidental CT findings: No worrisome lytic or sclerotic osseous abnormality. IMPRESSION: 1. New nodular and patchy ground-glass opacity in both lungs with basilar predominance. These areas of lung disease are hypermetabolic compatible infectious/inflammatory etiology and are associated with hypermetabolism in the inferior right hilum, presumably reactive. Lymphoma involvement is considered less likely but not excluded. Patient did have COVID infection approximately 3 months ago although prior chest CT from 07/25/2019 did not document this level of parenchymal disease. 2. Bulky nodal conglomeration in the central abdominal mesentery seen on the previous PET-CT has decreased substantially in size and metabolic activity. Deauville 3 category. 3. Hypermetabolic lymphadenopathy in the left axilla and right pelvic sidewall seen on previous PET-CT has resolved completely in the interval. 4. Interval resolution of splenomegaly. Electronically Signed   By: Misty Stanley M.D.   On: 11/07/2019 14:52     Assessment and plan 1. Follicular lymphoma of intra-abdominal lymph nodes, unspecified follicular lymphoma type ([place])   2. History of pulmonary embolism   3. History of DVT (deep vein thrombosis)   4. Viral pneumonia    Stage III follicular lymphoma Status post 6 cycles of BR. PET  scan was independently reviewed by me and discussed with patient and his wife Bulky nodal conglomeration in the central abdominal mesentery seen on previous PET scan has decreased substantially in size and metabolic activity.  Deauville 3 category Hypermetabolic lymphadenopathy in the left axilla and right pelvic sidewall seen on previous PET scan has resolved completely in  the interval. Interval resolution of splenomegaly.  New nodular and patchy groundglass opacities in both lungs with bibasilar predominance.  This area are hypermetabolic compatible with infectious/inflammation etiology and are associated with hypermetabolic in the inferior right hilum, presumably reactive.  Discussed with patient that he has achieved partial remission after six cycles of BR. And I recommend to proceed with rituximab maintenance. Hypermetabolism detected in the chest is likely inflammatory/infectious etiology due to Covid infection in late March 2021. Appreciate pulmonology input. Mild anemia, hemoglobin stable at 12.5. Continue to monitor. Cough and shortness of breath with exertion, likely Covid infection consequence. Recommend patient to follow-up with pulmonology . #History of DVT and PE, continue Eliquis for anticoagulation, I decrease to Eliquis 2.5 mg twice daily. Follow-up to be determined. Awaiting pulmonology input.   # Addendum, I discussed with pulmonology Dr.Alerskerov who will see patient and possible perform bronchscopy as there are new airspace disease on PET. Will hold off Rituximab until his lung work up is done.    Earlie Server, MD, PhD Hematology Oncology [facility] at [facility] Pager- [phone] 11/11/2019

## 2019-11-11 NOTE — Progress Notes (Signed)
Patient has episodes of SOBr on exertion with one of those episodes being today.  He reports his appetite is decreasing.

## 2019-11-26 ENCOUNTER — Other Ambulatory Visit: Payer: Self-pay

## 2019-11-26 ENCOUNTER — Encounter
Admission: RE | Admit: 2019-11-26 | Discharge: 2019-11-26 | Disposition: A | Discharge status: Home / Self Care | Payer: BC Managed Care – PPO | Source: Ambulatory Visit | Attending: Pulmonary Disease | Admitting: Pulmonary Disease

## 2019-11-26 DIAGNOSIS — Z01818 Encounter for other preprocedural examination: Secondary | ICD-10-CM | POA: Diagnosis not present

## 2019-11-26 LAB — PROTIME-INR
INR: 1.1 (ref 0.8–1.2)
Prothrombin Time: 13.8 seconds (ref 11.4–15.2)

## 2019-11-26 LAB — APTT: aPTT: 34 seconds (ref 24–36)

## 2019-11-26 NOTE — Patient Instructions (Addendum)
Your procedure is scheduled on: 12-02-19 MONDAY Report to Same Day Surgery 2nd floor medical mall [facility] Entrance-take elevator on left to 2nd floor.  Check in with surgery information desk.) To find out your arrival time please call [phone] between 1PM - 3PM on 11-29-19 FRIDAY  Remember: Instructions that are not followed completely may result in serious medical risk, up to and including death, or upon the discretion of your surgeon and anesthesiologist your surgery may need to be rescheduled.    _x___ 1. Do not eat food after midnight the night before your procedure. NO GUM OR CANDY AFTER MIDNIGHT. You may drink WATER up to 2 hours before you are scheduled to arrive at the hospital for your procedure.  Do not drink WATER within 2 hours of your scheduled arrival to the hospital.  Type 1 and type 2 diabetics should only drink water.    __x__ 2. No Alcohol for 24 hours before or after surgery.   __x__3. No Smoking or e-cigarettes for 24 prior to surgery.  Do not use any chewable tobacco products for at least 6 hour prior to surgery   ____  4. Bring all medications with you on the day of surgery if instructed.    __x__ 5. Notify your doctor if there is any change in your medical condition     (cold, fever, infections).    x___6. On the morning of surgery brush your teeth with toothpaste and water.  You may rinse your mouth with mouth wash if you wish.  Do not swallow any toothpaste or mouthwash.   Do not wear jewelry, make-up, hairpins, clips or nail polish.  Do not wear lotions, powders, or perfumes. You may wear deodorant.  Do not shave 48 hours prior to surgery. Men may shave face and neck.  Do not bring valuables to the hospital.    [facility] is not responsible for any belongings or valuables.               Contacts, dentures or bridgework may not be worn into surgery.  Leave your suitcase in the car. After surgery it may be brought to your room.  For patients admitted  to the hospital, discharge time is determined by your treatment team.  _  Patients discharged the day of surgery will not be allowed to drive home.  You will need someone to drive you home and stay with you the night of your procedure.    Please read over the following fact sheets that you were given:   [facility] Preparing for Surgery  ____ TAKE THE FOLLOWING MEDICATION THE MORNING OF SURGERY WITH A SMALL SIP OF WATER. These include:  1. NONE  2.  3.  4.  5.  6.  ____Fleets enema or Magnesium Citrate as directed.   ____ Use CHG Soap or sage wipes as directed on instruction sheet   _X___ Use inhalers on the day of surgery and bring to hospital day of surgery-USE YOUR ALBUTEROL AND BUDESONIDE NEBULIZER THE MORNING OF YOUR SURGERY  _X___ Stop Metformin 2 days prior to surgery-LAST DOSE ON 11-29-19 FRIDAY    ____ Take 1/2 of usual insulin dose the night before surgery and none on the morning surgery.   _x___ Follow recommendations from Cardiologist, Pulmonologist or PCP regarding stopping Aspirin, Coumadin, Plavix ,Eliquis, Effient, or Pradaxa, and Pletal-CALL DR ALESKEROV'S OFFICE REGARDING WHEN TO STOP YOUR ELIQUIS( [phone])  X____Stop Anti-inflammatories such as Advil, Aleve, Ibuprofen, Motrin, Naproxen, Naprosyn, Goodies powders or  aspirin products NOW-OK to take Tylenol   ____ Stop supplements until after surgery.    ____ Bring C-Pap to the hospital.

## 2019-11-28 ENCOUNTER — Other Ambulatory Visit
Admission: RE | Admit: 2019-11-28 | Discharge: 2019-11-28 | Disposition: A | Discharge status: Home / Self Care | Payer: BC Managed Care – PPO | Source: Ambulatory Visit | Attending: Pulmonary Disease | Admitting: Pulmonary Disease

## 2019-11-28 ENCOUNTER — Other Ambulatory Visit: Payer: Self-pay

## 2019-11-28 DIAGNOSIS — Z01812 Encounter for preprocedural laboratory examination: Secondary | ICD-10-CM | POA: Insufficient documentation

## 2019-11-28 DIAGNOSIS — Z20822 Contact with and (suspected) exposure to covid-19: Secondary | ICD-10-CM | POA: Insufficient documentation

## 2019-11-28 LAB — SARS CORONAVIRUS 2 (TAT 6-24 HRS): SARS Coronavirus 2: NEGATIVE

## 2019-12-02 ENCOUNTER — Ambulatory Visit
Admission: RE | Admit: 2019-12-02 | Discharge: 2019-12-02 | Disposition: A | Discharge status: Home / Self Care | Payer: BC Managed Care – PPO | Attending: Pulmonary Disease | Admitting: Pulmonary Disease

## 2019-12-02 ENCOUNTER — Encounter: Payer: Self-pay | Admitting: *Deleted

## 2019-12-02 ENCOUNTER — Ambulatory Visit: Payer: BC Managed Care – PPO | Admitting: Anesthesiology

## 2019-12-02 ENCOUNTER — Other Ambulatory Visit: Payer: Self-pay

## 2019-12-02 ENCOUNTER — Encounter
Admission: RE | Disposition: A | Discharge status: Home / Self Care | Payer: Self-pay | Source: Home / Self Care | Attending: Pulmonary Disease

## 2019-12-02 DIAGNOSIS — Z9981 Dependence on supplemental oxygen: Secondary | ICD-10-CM | POA: Insufficient documentation

## 2019-12-02 DIAGNOSIS — Z8616 Personal history of COVID-19: Secondary | ICD-10-CM | POA: Diagnosis not present

## 2019-12-02 DIAGNOSIS — C8213 Follicular lymphoma grade II, intra-abdominal lymph nodes: Secondary | ICD-10-CM | POA: Diagnosis not present

## 2019-12-02 DIAGNOSIS — R05 Cough: Secondary | ICD-10-CM | POA: Insufficient documentation

## 2019-12-02 DIAGNOSIS — J449 Chronic obstructive pulmonary disease, unspecified: Secondary | ICD-10-CM | POA: Diagnosis not present

## 2019-12-02 DIAGNOSIS — Z880 Allergy status to penicillin: Secondary | ICD-10-CM | POA: Insufficient documentation

## 2019-12-02 DIAGNOSIS — R918 Other nonspecific abnormal finding of lung field: Secondary | ICD-10-CM | POA: Insufficient documentation

## 2019-12-02 DIAGNOSIS — E119 Type 2 diabetes mellitus without complications: Secondary | ICD-10-CM | POA: Diagnosis not present

## 2019-12-02 DIAGNOSIS — Z86711 Personal history of pulmonary embolism: Secondary | ICD-10-CM | POA: Insufficient documentation

## 2019-12-02 DIAGNOSIS — Z833 Family history of diabetes mellitus: Secondary | ICD-10-CM | POA: Diagnosis not present

## 2019-12-02 DIAGNOSIS — Z7901 Long term (current) use of anticoagulants: Secondary | ICD-10-CM | POA: Insufficient documentation

## 2019-12-02 DIAGNOSIS — R590 Localized enlarged lymph nodes: Secondary | ICD-10-CM | POA: Diagnosis present

## 2019-12-02 HISTORY — PX: FLEXIBLE BRONCHOSCOPY: SHX5094

## 2019-12-02 LAB — GLUCOSE, CAPILLARY
Glucose-Capillary: 133 mg/dL — ABNORMAL HIGH (ref 70–99)
Glucose-Capillary: 131 mg/dL — ABNORMAL HIGH (ref 70–99)

## 2019-12-02 SURGERY — BRONCHOSCOPY, FLEXIBLE
Anesthesia: General

## 2019-12-02 MED ORDER — ROCURONIUM BROMIDE 100 MG/10ML IV SOLN
INTRAVENOUS | Status: DC | PRN
  Administered 2019-12-02: 10 mg via INTRAVENOUS
  Administered 2019-12-02: 30 mg via INTRAVENOUS

## 2019-12-02 MED ORDER — IPRATROPIUM-ALBUTEROL 0.5-2.5 (3) MG/3ML IN SOLN
RESPIRATORY_TRACT | Status: AC
  Filled 2019-12-02: qty 3

## 2019-12-02 MED ORDER — DEXAMETHASONE SODIUM PHOSPHATE 10 MG/ML IJ SOLN
INTRAMUSCULAR | Status: DC | PRN
  Administered 2019-12-02: 10 mg via INTRAVENOUS

## 2019-12-02 MED ORDER — FENTANYL CITRATE (PF) 100 MCG/2ML IJ SOLN
INTRAMUSCULAR | Status: DC | PRN
  Administered 2019-12-02 (×2): 50 ug via INTRAVENOUS

## 2019-12-02 MED ORDER — ONDANSETRON HCL 4 MG/2ML IJ SOLN: 4.0000 mg | Freq: Once | INTRAMUSCULAR | Status: DC | PRN

## 2019-12-02 MED ORDER — FENTANYL CITRATE (PF) 100 MCG/2ML IJ SOLN: 25.0000 ug | INTRAMUSCULAR | Status: DC | PRN

## 2019-12-02 MED ORDER — LIDOCAINE HCL (CARDIAC) PF 100 MG/5ML IV SOSY
PREFILLED_SYRINGE | INTRAVENOUS | Status: DC | PRN
  Administered 2019-12-02: 100 mg via INTRAVENOUS

## 2019-12-02 MED ORDER — IPRATROPIUM-ALBUTEROL 0.5-2.5 (3) MG/3ML IN SOLN
3.0000 mL | Freq: Once | RESPIRATORY_TRACT | Status: AC
  Administered 2019-12-02: 3 mL via RESPIRATORY_TRACT

## 2019-12-02 MED ORDER — PROPOFOL 10 MG/ML IV BOLUS
INTRAVENOUS | Status: DC | PRN
  Administered 2019-12-02: 150 mg via INTRAVENOUS

## 2019-12-02 MED ORDER — ONDANSETRON HCL 4 MG/2ML IJ SOLN
INTRAMUSCULAR | Status: DC | PRN
  Administered 2019-12-02: 4 mg via INTRAVENOUS

## 2019-12-02 MED ORDER — LIDOCAINE HCL URETHRAL/MUCOSAL 2 % EX GEL
1.0000 "application " | Freq: Once | CUTANEOUS | Status: DC
  Filled 2019-12-02: qty 5

## 2019-12-02 MED ORDER — SUGAMMADEX SODIUM 500 MG/5ML IV SOLN
INTRAVENOUS | Status: DC | PRN
  Administered 2019-12-02: 500 mg via INTRAVENOUS

## 2019-12-02 MED ORDER — SODIUM CHLORIDE 0.9 % IV SOLN: INTRAVENOUS | Status: DC

## 2019-12-02 MED ORDER — CHLORHEXIDINE GLUCONATE 0.12 % MT SOLN
OROMUCOSAL | Status: AC
  Administered 2019-12-02: 15 mL via OROMUCOSAL
  Filled 2019-12-02: qty 15

## 2019-12-02 MED ORDER — MIDAZOLAM HCL 2 MG/2ML IJ SOLN
INTRAMUSCULAR | Status: DC | PRN
  Administered 2019-12-02: 2 mg via INTRAVENOUS

## 2019-12-02 MED ORDER — FAMOTIDINE 20 MG PO TABS
ORAL_TABLET | ORAL | Status: AC
  Administered 2019-12-02: 20 mg via ORAL
  Filled 2019-12-02: qty 1

## 2019-12-02 MED ORDER — PHENYLEPHRINE HCL (PRESSORS) 10 MG/ML IV SOLN
INTRAVENOUS | Status: DC | PRN
  Administered 2019-12-02: 100 ug via INTRAVENOUS

## 2019-12-02 MED ORDER — FAMOTIDINE 20 MG PO TABS: 20.0000 mg | ORAL_TABLET | Freq: Once | ORAL | Status: AC

## 2019-12-02 MED ORDER — FENTANYL CITRATE (PF) 100 MCG/2ML IJ SOLN
INTRAMUSCULAR | Status: AC
  Filled 2019-12-02: qty 2

## 2019-12-02 MED ORDER — GLYCOPYRROLATE 0.2 MG/ML IJ SOLN
INTRAMUSCULAR | Status: DC | PRN
  Administered 2019-12-02: .2 mg via INTRAVENOUS

## 2019-12-02 MED ORDER — PHENYLEPHRINE HCL 0.25 % NA SOLN
1.0000 | Freq: Four times a day (QID) | NASAL | Status: DC | PRN
  Filled 2019-12-02: qty 15

## 2019-12-02 MED ORDER — CHLORHEXIDINE GLUCONATE 0.12 % MT SOLN: 15.0000 mL | Freq: Once | OROMUCOSAL | Status: AC

## 2019-12-02 MED ORDER — MIDAZOLAM HCL 2 MG/2ML IJ SOLN
INTRAMUSCULAR | Status: AC
  Filled 2019-12-02: qty 2

## 2019-12-02 MED ORDER — ORAL CARE MOUTH RINSE: 15.0000 mL | Freq: Once | OROMUCOSAL | Status: AC

## 2019-12-02 MED ORDER — SUCCINYLCHOLINE CHLORIDE 20 MG/ML IJ SOLN
INTRAMUSCULAR | Status: DC | PRN
  Administered 2019-12-02: 100 mg via INTRAVENOUS

## 2019-12-02 NOTE — H&P (Signed)
Pulmonary Medicine          Date: 12/02/2019,   MRN# [id number] Edward Atkinson 1961/01/20     AdmissionWeight: 81.8 kg                 CurrentWeight: 81.8 kg      CHIEF COMPLAINT:   Chronic cough with hx of lymphoma s/p COVID19 with worsening hilar LAD   HISTORY OF PRESENT ILLNESS   Patient with hx of lymphoma who was in process of receiving chemotherapy and had contracted COVID19 with worsening respiratory status. This was further complicated by worsening lymphadenopathy of hilar area as well as RUL and RLL infiltration with PET avid findings concerning for recurrence of lymphoma vs infectious etiology vs complications of chemotherapy.   09/02/19- patient finished chemo last week. He did have PE in 01/2019 with repeat CTPE done 07/25/19 with resolution of majority of clot burden besides residual LLL subsegmental PE. He shares that he noted worsening sob after 5th injection and it continued after the 6th injection. He had spO2 today at rest which was 88%. He sees Dr Tasia Catchings for oncology and there is plan for repeat CT chest. He had Thrombectomy done after initial PE and states he felt immediately better. He had left femoral DVT and is on eliquis still. He did not have IVC filter done.   09/23/19 - patient reports that he still is significantly dyspneic. He has had prednisone burst over which did help but he shares dyspnea became worse after tapering off systemic glucocorticoids. He is less breathless then initially however still very much dependent on O2. Currently at rest he is normoxic on room air but with minimal exertion does desaturate and requires 2L/min. He is currently at [facility] 4 becoming dyspneic with bathing and dressing himself. Patient had COVID19 in Feb 2021, he was [facility] for treatment and finished dose 4/5 of remdesivir on outpatient. He was vaccinated with JJ COVID vaccine in Jan 2021.    We discussed findings on PET and reviewed plan with ocologist as well as patient to  perform bronchoscopy with possible biopsy of abnormal lymphadenopathy. Patient is agreeable and wishes to proceed.     PAST MEDICAL HISTORY   Past Medical History:  Diagnosis Date  . COVID-19   . Diabetes mellitus without complication ([place])   . Diverticulitis   . Follicular lymphoma of intra-abdominal lymph nodes ([place]) 02/15/2019     SURGICAL HISTORY   Past Surgical History:  Procedure Laterality Date  . CHOLECYSTECTOMY    . COLON RESECTION     DUE TO DIVERTICULITIS  . PORTA CATH INSERTION N/A 03/11/2019   Procedure: PORTA CATH INSERTION;  Surgeon: Algernon Huxley, MD;  Location: [place] [facility];  Service: Cardiovascular;  Laterality: N/A;  . PULMONARY THROMBECTOMY N/A 01/29/2019   Procedure: PULMONARY THROMBECTOMY;  Surgeon: Algernon Huxley, MD;  Location: [place] [facility];  Service: Cardiovascular;  Laterality: N/A;     FAMILY HISTORY   Family History  Problem Relation Age of Onset  . Diabetes Brother      SOCIAL HISTORY   Social History   Tobacco Use  . Smoking status: Never Smoker  . Smokeless tobacco: Never Used  Vaping Use  . Vaping Use: Never used  Substance Use Topics  . Alcohol use: Yes    Alcohol/week: 0.0 - 2.0 standard drinks    Comment: occasional  . Drug use: Never     MEDICATIONS    Home Medication:  Current Medication:  Current Facility-Administered Medications:  .  0.9 %  sodium chloride infusion, , Intravenous, Continuous, [place], Amy, MD .  lidocaine (XYLOCAINE) 2 % jelly 1 application, 1 application, Topical, Once, Calan Doren, MD .  phenylephrine (NEO-SYNEPHRINE) 0.25 % nasal spray 1 spray, 1 spray, Each Nare, Q6H PRN, Ottie Glazier, MD    ALLERGIES   Penicillins     REVIEW OF SYSTEMS    Review of Systems:  Gen:  Denies  fever, sweats, chills weigh loss  HEENT: Denies blurred vision, double vision, ear pain, eye pain, hearing loss, nose bleeds, sore throat Cardiac:  No dizziness, chest pain or  heaviness, chest tightness,edema Resp:   Denies cough or sputum porduction, shortness of breath,wheezing, hemoptysis,  Gi: Denies swallowing difficulty, stomach pain, nausea or vomiting, diarrhea, constipation, bowel incontinence Gu:  Denies bladder incontinence, burning urine Ext:   Denies Joint pain, stiffness or swelling Skin: Denies  skin rash, easy bruising or bleeding or hives Endoc:  Denies polyuria, polydipsia , polyphagia or weight change Psych:   Denies depression, insomnia or hallucinations   Other:  All other systems negative   VS: BP 123/85   Pulse 95   Temp (!) 97.5 F (36.4 C) (Temporal)   Resp 12   Wt 81.8 kg   SpO2 98%   BMI 24.47 kg/m      PHYSICAL EXAM    GENERAL:NAD, no fevers, chills, no weakness no fatigue HEAD: Normocephalic, atraumatic.  EYES: Pupils equal, round, reactive to light. Extraocular muscles intact. No scleral icterus.  MOUTH: Moist mucosal membrane. Dentition intact. No abscess noted.  EAR, NOSE, THROAT: Clear without exudates. No external lesions.  NECK: Supple. No thyromegaly. No nodules. No JVD.  PULMONARY: CTAB CARDIOVASCULAR: S1 and S2. Regular rate and rhythm. No murmurs, rubs, or gallops. No edema. Pedal pulses 2+ bilaterally.  GASTROINTESTINAL: Soft, nontender, nondistended. No masses. Positive bowel sounds. No hepatosplenomegaly.  MUSCULOSKELETAL: No swelling, clubbing, or edema. Range of motion full in all extremities.  NEUROLOGIC: Cranial nerves II through XII are intact. No gross focal neurological deficits. Sensation intact. Reflexes intact.  SKIN: No ulceration, lesions, rashes, or cyanosis. Skin warm and dry. Turgor intact.  PSYCHIATRIC: Mood, affect within normal limits. The patient is awake, alert and oriented x 3. Insight, judgment intact.       IMAGING    NM PET Image Initial (PI) Skull Base To Thigh  Result Date: 11/07/2019 CLINICAL DATA:  Subsequent treatment strategy for lymphoma. EXAM: NUCLEAR MEDICINE PET  SKULL BASE TO THIGH TECHNIQUE: 10.5 mCi F-18 FDG was injected intravenously. Full-ring PET imaging was performed from the skull base to thigh after the radiotracer. CT data was obtained and used for attenuation correction and anatomic localization. Fasting blood glucose: 101 mg/dl COMPARISON:  CT 04/21/2019 FINDINGS: Mediastinal blood pool activity: SUV max 2.1 Liver activity: SUV max 3.4 NECK: No hypermetabolic lymph nodes in the neck. Incidental CT findings: none CHEST: Foci of ground-glass opacity are noted in the lungs bilaterally, basilar predominant and right lung slightly more than left. This is compatible with the patient's reported clinical history of recent COVID infection. Soft tissue fullness in the hilar regions may be reactive and there is focal hypermetabolism in the inferior right hilum with SUV max = 6.0. Discrete nodular ground-glass opacity in the anterior right upper lobe seen on 75/3 demonstrates SUV max = 3.4. Representative uptake in more confluent ground-glass opacity in the posterior right lower lobe demonstrates SUV max = 4.8 (image 81/8) No hypermetabolic mediastinal or  left hilar lymphadenopathy evident. Left axillary lymph node measuring 7 mm short axis with hypermetabolism on the previous PET-CT has completely resolved. Incidental CT findings: Right Port-A-Cath tip is positioned in the SVC/RA junction. ABDOMEN/PELVIS: Bulky lymphadenopathy/soft tissue in the left abdominal mesentery previously has decreased substantially in the interval this soft tissue was measured previously at 18 x 9 cm which compares to approximately 7 x 3 cm today. FDG accumulation within this tissue is also markedly decreased with SUV max = 2.9 today compared to 10.6 previously. The mesenteric and omental nodularity seen on the previous exam is substantially improved. 14 mm short axis right common iliac node measured on the previous study has resolved completely in the interval. 2 cm short axis right distal  external iliac/common femoral node measured previously now measures 0.4 cm short axis with no hypermetabolism on PET imaging. The right inguinal activity described previously has resolved completely in the interval. Incidental CT findings: No splenomegaly. Persistent stranding in the central mesentery and left renal hilum. Distal colonic anastomosis again noted. SKELETON: No focal hypermetabolic activity to suggest skeletal metastasis. Incidental CT findings: No worrisome lytic or sclerotic osseous abnormality. IMPRESSION: 1. New nodular and patchy ground-glass opacity in both lungs with basilar predominance. These areas of lung disease are hypermetabolic compatible infectious/inflammatory etiology and are associated with hypermetabolism in the inferior right hilum, presumably reactive. Lymphoma involvement is considered less likely but not excluded. Patient did have COVID infection approximately 3 months ago although prior chest CT from 07/25/2019 did not document this level of parenchymal disease. 2. Bulky nodal conglomeration in the central abdominal mesentery seen on the previous PET-CT has decreased substantially in size and metabolic activity. Deauville 3 category. 3. Hypermetabolic lymphadenopathy in the left axilla and right pelvic sidewall seen on previous PET-CT has resolved completely in the interval. 4. Interval resolution of splenomegaly. Electronically Signed   By: Misty Stanley M.D.   On: 11/07/2019 14:52      ASSESSMENT/PLAN   Lymphoma with worsening LAD post COVID19      -reviewed findings with patient       Plan for bronchoscopic airway inspection with EBUS assisted lymph node biopsies.  Complications include: bleeding, cough, infection, pneumothorax, pneumomediastinum, possible need for chest tube and intubation with mechanical ventilation and rarely death.  Patient relates understanding above and all additional questions have been answered.       Thank you for allowing me to  participate in the care of this patient.   Patient/Family are satisfied with care plan and all questions have been answered.   This document was prepared using Dragon voice recognition software and may include unintentional dictation errors.     Ottie Glazier, M.D.  Division of [place]

## 2019-12-02 NOTE — Discharge Instructions (Signed)
     AMBULATORY SURGERY  DISCHARGE INSTRUCTIONS   1) The drugs that you were given will stay in your system until tomorrow so for the next 24 hours you should not:  A) Drive an automobile B) Make any legal decisions C) Drink any alcoholic beverage   2) You may resume regular meals tomorrow.  Today it is better to start with liquids and gradually work up to solid foods.  You may eat anything you prefer, but it is better to start with liquids, then soup and crackers, and gradually work up to solid foods.   3) Please notify your doctor immediately if you have any unusual bleeding, trouble breathing, redness and pain at the surgery site, drainage, fever, or pain not relieved by medication.    4) Additional Instructions:        Please contact your physician with any problems or Same Day Surgery at [phone], Monday through Friday 6 am to 4 pm, or [place] at [facility] number at [phone].     Discharge Patient Home May resume all medications  Restart blood thinner tomorrow 12/03/19

## 2019-12-02 NOTE — Transfer of Care (Signed)
Immediate Anesthesia Transfer of Care Note  Patient: Edward Atkinson  Procedure(s) Performed: FLEXIBLE BRONCHOSCOPY (N/A )  Patient Location: PACU  Anesthesia Type:General  Level of Consciousness: awake, alert , oriented and patient cooperative  Airway & Oxygen Therapy: Patient Spontanous Breathing and Patient connected to face mask oxygen  Post-op Assessment: Report given to RN and Post -op Vital signs reviewed and stable  Post vital signs: Reviewed and stable  Last Vitals:  Vitals Value Taken Time  BP 100/77 12/02/19 0831  Temp 36.7 C 12/02/19 0831  Pulse 100 12/02/19 0834  Resp 27 12/02/19 0834  SpO2 98 % 12/02/19 0834  Vitals shown include unvalidated device data.  Last Pain:  Vitals:   12/02/19 0831  TempSrc:   PainSc: 0-No pain         Complications: No complications documented.

## 2019-12-02 NOTE — Procedures (Signed)
FIBEROPTIC BRONCHOSCOPY WITH BRONCHOALVEOLAR LAVAGE PROCEDURE NOTE  ENDOBRONCHIAL ULTRASOUND PROCEDURE NOTE    Flexible bronchoscopy was performed  by : Lanney Gins MD  assistance by : 1)Repiratory therapist  and 2)[employer] staff and 3) Anesthesia team and 4) Flouroscopy team and 5) [employer] supporting staff   Indication for the procedure was :  Pre-procedural H&P. The following assessment was performed on the day of the procedure prior to initiating sedation History:  Chest pain n Dyspnea y Hemoptysis n Cough y Fever n Other pertinent items n  Examination Vital signs -reviewed as per nursing documentation today Cardiac    Murmurs: n  Rubs : n  Gallop: n Lungs Wheezing: n Rales : n Rhonchi :y  Other pertinent findings: SOB/hypoxemia due to chronic lung disease   Pre-procedural assessment for Procedural Sedation included: Depth of sedation: As per anesthesia team  ASA Classification:  2 Mallampati airway assessment: 3    Medication list reviewed: y  The patient's interval history was taken and revealed: no new complaints The pre- procedure physical examination revealed: No new findings Refer to prior clinic note for details.  Informed Consent: Informed consent was obtained from:  patient after explanation of procedure and risks, benefits, as well as alternative procedures available.  Explanation of level of sedation and possible transfusion was also provided.    Procedural Preparation: Time out was performed and patient was identified by name and birthdate and procedure to be performed and side for sampling, if any, was specified. Pt was intubated by anesthesia.  The patient was appropriately draped.   Fiberoptic bronchoscopy with airway inspection and BAL Procedure findings:  Bronchoscope was inserted via ETT  without difficulty.  Posterior oropharynx, epiglottis, arytenoids, false cords and vocal cords were not visualized as these were bypassed by  endotracheal tube. The distal trachea was normal in circumference and appearance without mucosal, cartilaginous or branching abnormalities.  The main carina was mildly splayed . All right and left lobar airways were visualized to the Subsegmental level.  Sub- sub segmental carinae were identified in all the distal airways.   Secretions were visible in the following airways and appeared to be clear.  The mucosa was : NONFRIABLE THROUGHOUT  Airways were notable for:        exophytic lesions :n       extrinsic compression in the following distributions: n.       Friable mucosa: N       Anthrocotic material /pigmentation: n     Post procedure Diagnosis:   Normal airway inspection without abnormal lesion of mucosa, without exophytic lesions, without stenotic airway segments, without anthrocotic pigmentation.       Endobronchial ultrasound assisted hilar and mediastinal lymph node biopsies procedure findings: The fiberoptic bronchoscope was removed and the EBUS scope was introduced. Examination began to evaluate for pathologically enlarged lymph nodes starting on the Left side progressing to the right side. Stations were evaluated under direct [place] visualization in this order 4L>>10L>>11L>>7>>4R>>10R>>11R - all stations were normal in shape with normal healthy fatty core and size of each lymph node was b/w 59m>>6mm and were not biopsied   Post procedure diagnosis:  Normal non-enlarged hilar and mediastinal lymphadenopathy   Specimens obtained included:   Broncho-alveolar lavage site:RUL and RLL  sent for cytology and microbiology                             984mvolume infused 5039molume returned with clear/cellular appearance  Immediate sampling complications included:none Epinephrine 32m was used topically  The bronchoscopy was terminated due to completion of the planned procedure and the bronchoscope was removed.   Total dosage of Lidocaine was 061mTotal fluoroscopy time was 0  minutes  Supplemental oxygen was provided at na lpm by nasal canula post operatively  Estimated Blood loss: 0cc.  Complications included:  none   Disposition: None  Follow up with Dr. AlLanney Ginsn 5 days for result discussion.     FuOttie GlazierD  [facility]

## 2019-12-02 NOTE — Anesthesia Procedure Notes (Signed)
Procedure Name: Intubation Performed by: Kelton Pillar, CRNA Pre-anesthesia Checklist: Patient identified, Emergency Drugs available, Suction available and Patient being monitored Patient Re-evaluated:Patient Re-evaluated prior to induction Oxygen Delivery Method: Circle system utilized Preoxygenation: Pre-oxygenation with 100% oxygen Induction Type: IV induction Ventilation: Mask ventilation without difficulty Laryngoscope Size: McGraph and 3 Grade View: Grade I Tube type: Oral Tube size: 9.0 mm Number of attempts: 1 Airway Equipment and Method: Stylet and Oral airway Placement Confirmation: ETT inserted through vocal cords under direct vision,  positive ETCO2,  breath sounds checked- equal and bilateral and CO2 detector Secured at: 24 cm Tube secured with: Tape Dental Injury: Teeth and Oropharynx as per pre-operative assessment

## 2019-12-02 NOTE — Anesthesia Preprocedure Evaluation (Signed)
Anesthesia Evaluation  Patient identified by MRN, date of birth, ID band Patient awake    Reviewed: Allergy & Precautions, NPO status , Patient's Chart, lab work & pertinent test results  Airway Mallampati: II  TM Distance: >3 FB     Dental   Pulmonary pneumonia, COPD,  COPD inhaler, PE   Pulmonary exam normal        Cardiovascular + DVT  Normal cardiovascular exam     Neuro/Psych negative neurological ROS  negative psych ROS   GI/Hepatic Neg liver ROS,   Endo/Other  diabetes  Renal/GU negative Renal ROS     Musculoskeletal negative musculoskeletal ROS (+)   Abdominal Normal abdominal exam  (+)   Peds negative pediatric ROS (+)  Hematology  (+) Blood dyscrasia, ,   Anesthesia Other Findings Past Medical History: No date: COVID-19 No date: Diabetes mellitus without complication ([place]) No date: Diverticulitis 02/15/2019: Follicular lymphoma of intra-abdominal lymph nodes ([facility])  Reproductive/Obstetrics                             Anesthesia Physical Anesthesia Plan  ASA: III  Anesthesia Plan: General   Post-op Pain Management:    Induction: Intravenous  PONV Risk Score and Plan:   Airway Management Planned: Oral ETT  Additional Equipment:   Intra-op Plan:   Post-operative Plan: Extubation in OR  Informed Consent: I have reviewed the patients History and Physical, chart, labs and discussed the procedure including the risks, benefits and alternatives for the proposed anesthesia with the patient or authorized representative who has indicated his/her understanding and acceptance.     Dental advisory given  Plan Discussed with: CRNA and Surgeon  Anesthesia Plan Comments:         Anesthesia Quick Evaluation

## 2019-12-03 ENCOUNTER — Encounter: Payer: Self-pay | Admitting: Pulmonary Disease

## 2019-12-03 LAB — ACID FAST SMEAR (AFB, MYCOBACTERIA): Acid Fast Smear: NEGATIVE

## 2019-12-04 LAB — CULTURE, BAL-QUANTITATIVE W GRAM STAIN
Culture: NO GROWTH
Gram Stain: NONE SEEN

## 2019-12-04 LAB — CYTOLOGY - NON PAP

## 2019-12-04 LAB — ASPERGILLUS ANTIGEN, BAL/SERUM: Aspergillus Ag, BAL/Serum: 0.05 Index (ref 0.00–0.49)

## 2019-12-04 NOTE — Anesthesia Postprocedure Evaluation (Signed)
Anesthesia Post Note  Patient: Edward Atkinson  Procedure(s) Performed: FLEXIBLE BRONCHOSCOPY (N/A )  Patient location during evaluation: PACU Anesthesia Type: General Level of consciousness: awake and alert and oriented Pain management: pain level controlled Vital Signs Assessment: post-procedure vital signs reviewed and stable Respiratory status: spontaneous breathing Cardiovascular status: blood pressure returned to baseline Anesthetic complications: no   No complications documented.   Last Vitals:  Vitals:   12/02/19 0956 12/02/19 1031  BP:  119/72  Pulse: 95 95  Resp: (!) 24 20  Temp:  36.7 C  SpO2: 94% 95%    Last Pain:  Vitals:   12/02/19 1031  TempSrc:   PainSc: 0-No pain                 Jenasia Dolinar

## 2019-12-20 ENCOUNTER — Telehealth: Payer: Self-pay

## 2019-12-20 ENCOUNTER — Encounter: Payer: Self-pay | Admitting: Oncology

## 2019-12-20 NOTE — Telephone Encounter (Signed)
Edward Atkinson please schedule patient for Lab/MD/ rituximab on 9/7. Pt will see appt on mychart. I have sent Mychart message to pt requesting for him to let us know if Dr. Lanney Gins thinks he should not resume treatment.

## 2019-12-20 NOTE — Telephone Encounter (Signed)
Please schedule him for lab md Rituximab 2 weeks after he sees Dr.A. also ask pt to ask Dr.A when he sees him on 8/24 if ok with resuming lymphoma treatment from pulmonology aspect. Thanks.

## 2019-12-20 NOTE — Telephone Encounter (Signed)
Done.. Pt has been sched for [facility] Lab/MD/ Rituximab on 01/07/20 as requested.

## 2019-12-20 NOTE — Telephone Encounter (Signed)
Pt had bronchoscopy on 8/2 by Dr. Lanney Gins and follows up with him again on 8/24. Patient calling about follow up appt here. please advise.

## 2019-12-25 ENCOUNTER — Other Ambulatory Visit: Payer: Self-pay | Admitting: Oncology

## 2019-12-30 ENCOUNTER — Telehealth: Payer: Self-pay

## 2019-12-30 NOTE — Telephone Encounter (Signed)
Nutrition Assessment:  Patient identified on Malnutrition Screening report for weight loss and poor appetite.   59 year old male with follicular lymphoma, PE. Noted COVID pneumonia admission 3/29-08/09/19.  Past medical history of DM, diverticulitis  Spoke with patient via phone to introduce self and service at [facility].  Patient reports that he gets nauseated when smelling something greasy or fried.  Reports that he tries to eat 3 meals and snacks.  Sometimes his work schedule will just allow him to grab a shake and pack of nabs or fruit.  Breakfast is cereal, sometimes eggs.  Supper is usually meat and couple of side items.  Drinks muscle milk shakes, likes slim fast.    Chemotherapy being held due to on antibiotics for pneumonia.    Medications: metformin, compazine, amaryl  Labs: reviewed  Anthropometrics:   Height: 72 inches Weight: 180 lb on 7/12 Patient reports 175 lb recently 204 lb in March 2021 BMI: 24  12% weight loss in the last 5 months, significant   Estimated Energy Needs  Kcals: 2050-2460 Protein: 102-123 g Fluid: > 2 L  NUTRITION DIAGNOSIS: Inadequate oral intake related to cancer, COVID pneumonia as evidenced by 12% weight loss in the last 5 months   INTERVENTION:  Reviewed ways to add calories and protein in diet.  Will mail handout Encouraged high calorie oral nutrition supplement that patient likes.  Discussed ways to add calories without increasing blood glucose.  Contact information provided to patient    MONITORING, EVALUATION, GOAL: weight trends, intake   NEXT VISIT: Sept 27, phone f/u  Seline Enzor B. Zenia Resides, [place], [place] Registered Dietitian [phone] (mobile)

## 2020-01-02 LAB — FUNGUS CULTURE WITH STAIN

## 2020-01-02 LAB — FUNGUS CULTURE RESULT

## 2020-01-02 LAB — FUNGAL ORGANISM REFLEX

## 2020-01-07 ENCOUNTER — Inpatient Hospital Stay (HOSPITAL_BASED_OUTPATIENT_CLINIC_OR_DEPARTMENT_OTHER): Payer: BC Managed Care – PPO | Admitting: Oncology

## 2020-01-07 ENCOUNTER — Other Ambulatory Visit: Payer: Self-pay

## 2020-01-07 ENCOUNTER — Inpatient Hospital Stay: Payer: BC Managed Care – PPO

## 2020-01-07 ENCOUNTER — Encounter: Payer: Self-pay | Admitting: Oncology

## 2020-01-07 ENCOUNTER — Inpatient Hospital Stay: Payer: BC Managed Care – PPO | Attending: Oncology

## 2020-01-07 VITALS — BP 129/82 | HR 105 | Temp 96.7°F | Wt 177.2 lb

## 2020-01-07 VITALS — BP 114/76 | HR 88 | Temp 97.0°F | Resp 18

## 2020-01-07 DIAGNOSIS — Z86718 Personal history of other venous thrombosis and embolism: Secondary | ICD-10-CM | POA: Diagnosis not present

## 2020-01-07 DIAGNOSIS — Z8616 Personal history of COVID-19: Secondary | ICD-10-CM | POA: Diagnosis not present

## 2020-01-07 DIAGNOSIS — J849 Interstitial pulmonary disease, unspecified: Secondary | ICD-10-CM | POA: Diagnosis not present

## 2020-01-07 DIAGNOSIS — Z5112 Encounter for antineoplastic immunotherapy: Secondary | ICD-10-CM | POA: Insufficient documentation

## 2020-01-07 DIAGNOSIS — Z95828 Presence of other vascular implants and grafts: Secondary | ICD-10-CM

## 2020-01-07 DIAGNOSIS — Z7901 Long term (current) use of anticoagulants: Secondary | ICD-10-CM | POA: Insufficient documentation

## 2020-01-07 DIAGNOSIS — C8280 Other types of follicular lymphoma, unspecified site: Secondary | ICD-10-CM

## 2020-01-07 DIAGNOSIS — C8223 Follicular lymphoma grade III, unspecified, intra-abdominal lymph nodes: Secondary | ICD-10-CM | POA: Diagnosis not present

## 2020-01-07 DIAGNOSIS — C8293 Follicular lymphoma, unspecified, intra-abdominal lymph nodes: Secondary | ICD-10-CM | POA: Diagnosis not present

## 2020-01-07 DIAGNOSIS — Z5111 Encounter for antineoplastic chemotherapy: Secondary | ICD-10-CM | POA: Diagnosis not present

## 2020-01-07 DIAGNOSIS — Z86711 Personal history of pulmonary embolism: Secondary | ICD-10-CM | POA: Diagnosis not present

## 2020-01-07 LAB — COMPREHENSIVE METABOLIC PANEL
ALT: 20 U/L (ref 0–44)
AST: 21 U/L (ref 15–41)
Albumin: 3.9 g/dL (ref 3.5–5.0)
Alkaline Phosphatase: 72 U/L (ref 38–126)
Anion gap: 10 (ref 5–15)
BUN: 17 mg/dL (ref 8–23)
CO2: 25 mmol/L (ref 22–32)
Calcium: 9.1 mg/dL (ref 8.9–10.3)
Chloride: 100 mmol/L (ref 98–111)
Creatinine, Ser: 0.85 mg/dL (ref 0.61–1.24)
GFR calc Af Amer: >60 mL/min (ref >60)
GFR calc non Af Amer: >60 mL/min (ref >60)
Glucose, Bld: 142 mg/dL — ABNORMAL HIGH (ref 70–99)
Potassium: 3.7 mmol/L (ref 3.5–5.1)
Sodium: 135 mmol/L (ref 135–145)
Total Bilirubin: 0.5 mg/dL (ref 0.3–1.2)
Total Protein: 7.2 g/dL (ref 6.5–8.1)

## 2020-01-07 LAB — CBC WITH DIFFERENTIAL/PLATELET
Abs Immature Granulocytes: 0.05 10*3/uL (ref 0.00–0.07)
Basophils Absolute: 0 10*3/uL (ref 0.0–0.1)
Basophils Relative: 0 %
Eosinophils Absolute: 0.1 10*3/uL (ref 0.0–0.5)
Eosinophils Relative: 1 %
HCT: 38.9 % — ABNORMAL LOW (ref 39.0–52.0)
Hemoglobin: 12.7 g/dL — ABNORMAL LOW (ref 13.0–17.0)
Immature Granulocytes: 1 %
Lymphocytes Relative: 11 %
Lymphs Abs: 0.7 10*3/uL (ref 0.7–4.0)
MCH: 26.5 pg (ref 26.0–34.0)
MCHC: 32.6 g/dL (ref 30.0–36.0)
MCV: 81.2 fL (ref 80.0–100.0)
Monocytes Absolute: 0.7 10*3/uL (ref 0.1–1.0)
Monocytes Relative: 11 %
Neutro Abs: 5 10*3/uL (ref 1.7–7.7)
Neutrophils Relative %: 76 %
Platelets: 302 10*3/uL (ref 150–400)
RBC: 4.79 MIL/uL (ref 4.22–5.81)
RDW: 14.7 % (ref 11.5–15.5)
WBC: 6.6 10*3/uL (ref 4.0–10.5)
nRBC: 0 % (ref 0.0–0.2)

## 2020-01-07 MED ORDER — SODIUM CHLORIDE 0.9 % IV SOLN
Freq: Once | INTRAVENOUS | Status: AC
  Filled 2020-01-07: qty 250

## 2020-01-07 MED ORDER — HEPARIN SOD (PORK) LOCK FLUSH 100 UNIT/ML IV SOLN
INTRAVENOUS | Status: AC
  Filled 2020-01-07: qty 5

## 2020-01-07 MED ORDER — LIDOCAINE-PRILOCAINE 2.5-2.5 % EX CREA: 1.0000 "application " | TOPICAL_CREAM | CUTANEOUS | 1 refills | Status: DC | PRN

## 2020-01-07 MED ORDER — SODIUM CHLORIDE 0.9 % IV SOLN
375.0000 mg/m2 | Freq: Once | INTRAVENOUS | Status: AC
  Administered 2020-01-07: 800 mg via INTRAVENOUS
  Filled 2020-01-07: qty 50

## 2020-01-07 MED ORDER — DIPHENHYDRAMINE HCL 25 MG PO CAPS
50.0000 mg | ORAL_CAPSULE | Freq: Once | ORAL | Status: AC
  Administered 2020-01-07: 50 mg via ORAL
  Filled 2020-01-07: qty 2

## 2020-01-07 MED ORDER — ACETAMINOPHEN 325 MG PO TABS
650.0000 mg | ORAL_TABLET | Freq: Once | ORAL | Status: AC
  Administered 2020-01-07: 650 mg via ORAL
  Filled 2020-01-07: qty 2

## 2020-01-07 MED ORDER — SODIUM CHLORIDE 0.9% FLUSH
10.0000 mL | Freq: Once | INTRAVENOUS | Status: AC
  Administered 2020-01-07: 10 mL via INTRAVENOUS
  Filled 2020-01-07: qty 10

## 2020-01-07 MED ORDER — HEPARIN SOD (PORK) LOCK FLUSH 100 UNIT/ML IV SOLN
500.0000 [IU] | Freq: Once | INTRAVENOUS | Status: AC
  Administered 2020-01-07: 500 [IU] via INTRAVENOUS
  Filled 2020-01-07: qty 5

## 2020-01-07 MED ORDER — HEPARIN SOD (PORK) LOCK FLUSH 100 UNIT/ML IV SOLN
500.0000 [IU] | Freq: Once | INTRAVENOUS | Status: DC | PRN
  Filled 2020-01-07: qty 5

## 2020-01-07 MED ORDER — ACYCLOVIR 400 MG PO TABS
400.0000 mg | ORAL_TABLET | Freq: Every day | ORAL | 3 refills | Status: DC
Start: 2020-01-07 — End: 2020-03-03

## 2020-01-07 NOTE — Progress Notes (Signed)
Pt here for follow up. No new concerns voiced.   

## 2020-01-07 NOTE — Progress Notes (Signed)
1000: Per Benjamine Mola RN per Dr. Tasia Catchings okay to proceed with treatment with HR 105.   1235: Pt tolerated infusion well, no s/s of distress or reaction noted. Pt and VS stable at discharge.

## 2020-01-07 NOTE — Progress Notes (Signed)
HR 105 ok to proceed per md 

## 2020-01-07 NOTE — Progress Notes (Signed)
Hematology/Oncology Follow Up Note [facility]  [phone] Fax:([phone]  Patient Care Team: Rusty Aus, MD as PCP - General (Internal Medicine)   Name of the patient: Edward Atkinson  [id number]  1960-09-28   REASON FOR VISIT  follow-up for management of pulmonary embolism, follicular cell lymphoma.  PERTINENT ONCOLOGY HISTORY # 01/31/2019 Unprovoked PE,  CT chest PE protocol reviewed bilateral massive pulmonary embolism with right heart strain and patient was started on heparin drip.  Patient was tachypneic and tachycardia.  Vascular surgeon was consulted and the patient was taken to the OR for thrombectomy and thrombolysis.  Patient also had selective catheter placement right upper lobe, middle lobe, lower lobe pulmonary arteries, left upper and lower pulmonary arteries. Ultrasound venous duplex bilateral showed DVT of the left lower extremity with nonocclusive thrombus in the left common femoral vein.  Some of the common femoral venous thrombus does extend just into the GSV across SF J.  No evidence of right lower extremity DVT. Patient was started on heparin drip for anticoagulation. Patient is status post embolectomy by vascular surgery. #Additional CT pancreas was done during that admission which showed peripancreatic/retroperitoneal and contiguous small bowel mesentery nodal mass, retroperitoneal adenopathy, gastrohepatic ligament lymphadenopathy, left abdominal omental nodule CT-guided omentum biopsy was done during hospital.  Patient was switched to Lovenox at discharge.  #  PET scan done on 02/11/2019 which showed large conglomerate retroperitoneal and mesenteric mass with maximal SUV 10.6 compatible with Deauville 5 disease.  Deauville 4 adenopathy in the pelvis. A left axillary lymph node measuring 0.7 cm in short axis has a maximum SUV of 3.3. Mild splenomegaly without focal splenic lesion identified. #02/21/2019, bone marrow biopsy  showed no lymphoma involvement.  There might be lymphocyte aggregates that were being cut away on the deeper levels.  Additional IHC did not show residual echograms.  # admitted from 07/29/2019- 08/09/2019 due to COVID pneumonia. Treated with remdesivir and steroids.  He was discharged and still felt some shortness of breath and was seen by PCP Dr.Elston on 08/09/2019 He was given another course of antibiotics with moxifloxacin 400 mg daily for 10 days, Prednisone tapering and nebulizer.  #Status post 6 cycles of BR.  INTERVAL HISTORY 59 y.o. male with newly diagnosed pulmonary embolism and follicular lymphoma present for follow-up-evaluation  Patient has completed 6 cycles of BR.  Treatment was interrupted during Covid infection March. Patient was seen by pulmonology during interval.  Patient has had bronchoscopy for work-up of groundglass opacities shown on his images.  No definitive infectious etiology was found.  Negative for fungal culture, aspergillosis negative AFB.  Patient was cleared by pulmonology to go off rituximab.  Findings on images were considered to be secondary to interstitial lung disease due to Covid 19 infection and patient has been recommended to go to [facility] interstitial lung disease clinic for evaluation. Patient was accompanied by his wife today.  He continues to have intermittent cough and shortness of breath with exertion. Otherwise no new complaints.  His appetite is not very good due to lack of taste.   Review of Systems  Constitutional: Negative for appetite change, chills, fatigue, fever and unexpected weight change.  HENT:   Negative for hearing loss and voice change.   Eyes: Negative for eye problems and icterus.  Respiratory: Positive for cough and shortness of breath. Negative for chest tightness.   Cardiovascular: Negative for chest pain and leg swelling.  Gastrointestinal: Negative for abdominal distention and abdominal pain.  Endocrine: Negative for hot flashes.   Genitourinary: Negative for difficulty urinating, dysuria and frequency.   Musculoskeletal: Negative for arthralgias.  Skin: Negative for itching and rash.  Neurological: Negative for light-headedness and numbness.  Hematological: Negative for adenopathy. Does not bruise/bleed easily.  Psychiatric/Behavioral: Negative for confusion.      Allergies  Allergen Reactions  . Penicillins Rash    Did it involve swelling of the face/tongue/throat, SOB, or low BP? No Did it involve sudden or severe rash/hives, skin peeling, or any reaction on the inside of your mouth or nose? No Did you need to seek medical attention at a hospital or doctor's office? No When did it last happen?Unknown If all above answers are "NO", may proceed with cephalosporin use.     Past Medical History:  Diagnosis Date  . COVID-19   . Diabetes mellitus without complication ([place])   . Diverticulitis   . Follicular lymphoma of intra-abdominal lymph nodes ([place]) 02/15/2019     Past Surgical History:  Procedure Laterality Date  . CHOLECYSTECTOMY    . COLON RESECTION     DUE TO DIVERTICULITIS  . FLEXIBLE BRONCHOSCOPY N/A 12/02/2019   Procedure: FLEXIBLE BRONCHOSCOPY;  Surgeon: Ottie Glazier, MD;  Location: [facility] ORS;  Service: Thoracic;  Laterality: N/A;  . PORTA CATH INSERTION N/A 03/11/2019   Procedure: PORTA CATH INSERTION;  Surgeon: Algernon Huxley, MD;  Location: [place] [facility];  Service: Cardiovascular;  Laterality: N/A;  . PULMONARY THROMBECTOMY N/A 01/29/2019   Procedure: PULMONARY THROMBECTOMY;  Surgeon: Algernon Huxley, MD;  Location: [place] [facility];  Service: Cardiovascular;  Laterality: N/A;    Social History   Socioeconomic History  . Marital status: Married    Spouse name: Not on file  . Number of children: Not on file  . Years of education: Not on file  . Highest education level: Not on file  Occupational History  . Not on file  Tobacco Use  . Smoking status: Never Smoker  .  Smokeless tobacco: Never Used  Vaping Use  . Vaping Use: Never used  Substance and Sexual Activity  . Alcohol use: Yes    Alcohol/week: 0.0 - 2.0 standard drinks    Comment: occasional  . Drug use: Never  . Sexual activity: Yes  Other Topics Concern  . Not on file  Social History Narrative  . Not on file   Social Determinants of Health   Financial Resource Strain:   . Difficulty of Paying Living Expenses: Not on file  Food Insecurity:   . Worried About Charity fundraiser in the Last Year: Not on file  . Ran Out of Food in the Last Year: Not on file  Transportation Needs:   . Lack of Transportation (Medical): Not on file  . Lack of Transportation (Non-Medical): Not on file  Physical Activity:   . Days of Exercise per Week: Not on file  . Minutes of Exercise per Session: Not on file  Stress:   . Feeling of Stress : Not on file  Social Connections:   . Frequency of Communication with Friends and Family: Not on file  . Frequency of Social Gatherings with Friends and Family: Not on file  . Attends Religious Services: Not on file  . Active Member of Clubs or Organizations: Not on file  . Attends Archivist Meetings: Not on file  . Marital Status: Not on file  Intimate Partner Violence:   . Fear of Current or Ex-Partner: Not on file  .  Emotionally Abused: Not on file  . Physically Abused: Not on file  . Sexually Abused: Not on file    Family History  Problem Relation Age of Onset  . Diabetes Brother      Current Outpatient Medications:  .  ADVAIR DISKUS 100-50 MCG/DOSE AEPB, Inhale 1 puff into the lungs 2 (two) times daily. (Patient not taking: Reported on 11/11/2019), Disp: , Rfl:  .  albuterol (PROVENTIL) (2.5 MG/3ML) 0.083% nebulizer solution, Take 2.5 mg by nebulization in the morning, at noon, and at bedtime. , Disp: , Rfl:  .  albuterol (VENTOLIN HFA) 108 (90 Base) MCG/ACT inhaler, 2 puffs q.i.d. p.r.n. short of breath, wheezing, or cough (Patient not  taking: Reported on 11/11/2019), Disp: , Rfl:  .  budesonide (PULMICORT) 1 MG/2ML nebulizer solution, Take 1 mg by nebulization every morning. , Disp: , Rfl:  .  Colchicine (MITIGARE) 0.6 MG CAPS, Take 0.6 mg by mouth at bedtime. , Disp: , Rfl:  .  dextromethorphan-guaiFENesin (MUCINEX DM) 30-600 MG 12hr tablet, Take 1 tablet by mouth 2 (two) times daily as needed for cough. (Patient not taking: Reported on 11/18/2019), Disp: , Rfl:  .  ELIQUIS 2.5 MG TABS tablet, TAKE 1 TABLET BY MOUTH TWICE A DAY, Disp: 60 tablet, Rfl: 3 .  glimepiride (AMARYL) 4 MG tablet, Take 4 mg by mouth daily with breakfast., Disp: , Rfl:  .  lidocaine-prilocaine (EMLA) cream, Apply 1 application topically as needed. (Patient not taking: Reported on 11/18/2019), Disp: 30 g, Rfl: 0 .  metFORMIN (GLUCOPHAGE) 500 MG tablet, Take 500 mg by mouth 3 (three) times daily with meals. , Disp: , Rfl:  .  montelukast (SINGULAIR) 10 MG tablet, Take 10 mg by mouth at bedtime. , Disp: , Rfl:  .  prochlorperazine (COMPAZINE) 10 MG tablet, Take 1 tablet (10 mg total) by mouth every 6 (six) hours as needed (Nausea or vomiting)., Disp: 30 tablet, Rfl: 1  Physical exam:  Vitals:   01/07/20 0849  BP: 129/82  Pulse: (!) 105  Temp: (!) 96.7 F (35.9 C)  SpO2: 100%  Weight: 177 lb 3.2 oz (80.4 kg)   Physical Exam Constitutional:      General: He is not in acute distress. HENT:     Head: Normocephalic and atraumatic.  Eyes:     General: No scleral icterus. Cardiovascular:     Rate and Rhythm: Normal rate and regular rhythm.     Heart sounds: Normal heart sounds.  Pulmonary:     Effort: Pulmonary effort is normal. No respiratory distress.     Breath sounds: No wheezing.     Comments: Bilateral basilar crackles Abdominal:     General: Bowel sounds are normal. There is no distension.     Palpations: Abdomen is soft.  Musculoskeletal:        General: No deformity. Normal range of motion.     Cervical back: Normal range of motion and  neck supple.  Skin:    General: Skin is warm and dry.     Findings: No erythema or rash.  Neurological:     Mental Status: He is alert and oriented to person, place, and time. Mental status is at baseline.     Cranial Nerves: No cranial nerve deficit.     Coordination: Coordination normal.  Psychiatric:        Mood and Affect: Mood normal.     CMP Latest Ref Rng & Units 11/11/2019  Glucose 70 - 99 mg/dL 102(H)  BUN 8 -  23 mg/dL 8  Creatinine 0.61 - 1.24 mg/dL 0.83  Sodium 135 - 145 mmol/L 137  Potassium 3.5 - 5.1 mmol/L 3.6  Chloride 98 - 111 mmol/L 100  CO2 22 - 32 mmol/L 25  Calcium 8.9 - 10.3 mg/dL 9.3  Total Protein 6.5 - 8.1 g/dL 7.7  Total Bilirubin 0.3 - 1.2 mg/dL 0.5  Alkaline Phos 38 - 126 U/L 62  AST 15 - 41 U/L 26  ALT 0 - 44 U/L 22   CBC Latest Ref Rng & Units 11/11/2019  WBC 4.0 - 10.5 K/uL 6.0  Hemoglobin 13.0 - 17.0 g/dL 12.5(L)  Hematocrit 39 - 52 % 38.0(L)  Platelets 150 - 400 K/uL 318   RADIOGRAPHIC STUDIES: I have personally reviewed the radiological images as listed and agreed with the findings in the report. NM PET Image Initial (PI) Skull Base To Thigh  Result Date: 11/07/2019 CLINICAL DATA:  Subsequent treatment strategy for lymphoma. EXAM: NUCLEAR MEDICINE PET SKULL BASE TO THIGH TECHNIQUE: 10.5 mCi F-18 FDG was injected intravenously. Full-ring PET imaging was performed from the skull base to thigh after the radiotracer. CT data was obtained and used for attenuation correction and anatomic localization. Fasting blood glucose: 101 mg/dl COMPARISON:  CT 04/21/2019 FINDINGS: Mediastinal blood pool activity: SUV max 2.1 Liver activity: SUV max 3.4 NECK: No hypermetabolic lymph nodes in the neck. Incidental CT findings: none CHEST: Foci of ground-glass opacity are noted in the lungs bilaterally, basilar predominant and right lung slightly more than left. This is compatible with the patient's reported clinical history of recent COVID infection. Soft tissue  fullness in the hilar regions may be reactive and there is focal hypermetabolism in the inferior right hilum with SUV max = 6.0. Discrete nodular ground-glass opacity in the anterior right upper lobe seen on 75/3 demonstrates SUV max = 3.4. Representative uptake in more confluent ground-glass opacity in the posterior right lower lobe demonstrates SUV max = 4.8 (image 33/3) No hypermetabolic mediastinal or left hilar lymphadenopathy evident. Left axillary lymph node measuring 7 mm short axis with hypermetabolism on the previous PET-CT has completely resolved. Incidental CT findings: Right Port-A-Cath tip is positioned in the SVC/RA junction. ABDOMEN/PELVIS: Bulky lymphadenopathy/soft tissue in the left abdominal mesentery previously has decreased substantially in the interval this soft tissue was measured previously at 18 x 9 cm which compares to approximately 7 x 3 cm today. FDG accumulation within this tissue is also markedly decreased with SUV max = 2.9 today compared to 10.6 previously. The mesenteric and omental nodularity seen on the previous exam is substantially improved. 14 mm short axis right common iliac node measured on the previous study has resolved completely in the interval. 2 cm short axis right distal external iliac/common femoral node measured previously now measures 0.4 cm short axis with no hypermetabolism on PET imaging. The right inguinal activity described previously has resolved completely in the interval. Incidental CT findings: No splenomegaly. Persistent stranding in the central mesentery and left renal hilum. Distal colonic anastomosis again noted. SKELETON: No focal hypermetabolic activity to suggest skeletal metastasis. Incidental CT findings: No worrisome lytic or sclerotic osseous abnormality. IMPRESSION: 1. New nodular and patchy ground-glass opacity in both lungs with basilar predominance. These areas of lung disease are hypermetabolic compatible infectious/inflammatory etiology and  are associated with hypermetabolism in the inferior right hilum, presumably reactive. Lymphoma involvement is considered less likely but not excluded. Patient did have COVID infection approximately 3 months ago although prior chest CT from 07/25/2019 did not document this  level of parenchymal disease. 2. Bulky nodal conglomeration in the central abdominal mesentery seen on the previous PET-CT has decreased substantially in size and metabolic activity. Deauville 3 category. 3. Hypermetabolic lymphadenopathy in the left axilla and right pelvic sidewall seen on previous PET-CT has resolved completely in the interval. 4. Interval resolution of splenomegaly. Electronically Signed   By: Misty Stanley M.D.   On: 11/07/2019 14:52     Assessment and plan 1. Follicular lymphoma of intra-abdominal lymph nodes, unspecified follicular lymphoma type ([place])   2. Encounter for antineoplastic chemotherapy   3. History of pulmonary embolism   4. History of DVT (deep vein thrombosis)   5. Interstitial lung disease (H[facility])    Stage III follicular lymphoma Status post 6 cycles of BR, good response. Complete remission,  Given his bulky disease, recommend patient go on rituximab maintenance every 8 weeks for 2 years if he is able to tolerate.  Recommend shingle prophylaxis.  Please consider to be immunocompromised and recommend patient to consider booster vaccination for COVID-19.  New nodular and patchy groundglass opacities in both lungs with bibasilar predominance, patient has been evaluated by pulmonology.  Changes are considered to be secondary to interstitial lung disease secondary to COVID-19 infection. Patient has been referred to [facility] interstitial lung disease clinic. Patient also is on prednisone 10 mg daily for treatments.  His symptoms has improved.  #History of DVT and PE, continue Eliquis for anticoagulation, continue Eliquis 2.5 mg twice daily.   Earlie Server, MD, PhD Hematology Oncology [facility] Pager- [phone] 01/07/2020

## 2020-01-15 LAB — ACID FAST CULTURE WITH REFLEXED SENSITIVITIES (MYCOBACTERIA): Acid Fast Culture: NEGATIVE

## 2020-01-27 ENCOUNTER — Inpatient Hospital Stay: Payer: BC Managed Care – PPO

## 2020-01-27 NOTE — Progress Notes (Signed)
Nutrition Follow-up:  Patient with follicular lymphoma, PE. Patient followed by Dr Tasia Catchings.  Patient receiving rituximab.    Spoke with patient via phone for nutrition follow-up.  Patient received handouts that RD mailed after initial phone call.  Patient reports that he still can't tolerate smelly something greasy or fried.  Reports that taste are coming back.  Has been trying to drink 2 protein drinks vs just one at time.  Reports having big plate of spaghetti last night that was delicious.  Ate sausage, egg and cheese biscuit for breakfast and heading home for lunch (likely left over spaghetti).  Feels that nutrition is improving.  Balancing increased carbohydrates and blood glucose.       Medications: reviewed  Labs: reviewed  Anthropometrics:   Weight 177 lb 3.2 oz on 9/7 decreased from 180 lb on 7/12.    Patient reports on home scale 175-176 lb  NUTRITION DIAGNOSIS: Inadequate oral intake improving    INTERVENTION:  Encouraged patient to continue high calorie, high protein foods to prevent further weight loss. Patient without questions or concerns at this time. Patient will reach out to RD if something changes in nutrition    MONITORING, EVALUATION, GOAL: weight trends, intake   NEXT VISIT: no follow-up  Kamyra Schroeck B. Zenia Resides, [place], [place] Registered Dietitian [phone] (mobile)

## 2020-03-02 ENCOUNTER — Other Ambulatory Visit: Payer: Self-pay

## 2020-03-02 DIAGNOSIS — C8293 Follicular lymphoma, unspecified, intra-abdominal lymph nodes: Secondary | ICD-10-CM

## 2020-03-03 ENCOUNTER — Encounter: Payer: Self-pay | Admitting: Oncology

## 2020-03-03 ENCOUNTER — Inpatient Hospital Stay: Payer: BC Managed Care – PPO | Attending: Oncology

## 2020-03-03 ENCOUNTER — Other Ambulatory Visit: Payer: Self-pay

## 2020-03-03 ENCOUNTER — Inpatient Hospital Stay (HOSPITAL_BASED_OUTPATIENT_CLINIC_OR_DEPARTMENT_OTHER): Payer: BC Managed Care – PPO | Admitting: Oncology

## 2020-03-03 ENCOUNTER — Inpatient Hospital Stay: Payer: BC Managed Care – PPO

## 2020-03-03 VITALS — BP 128/91 | HR 84 | Temp 97.3°F | Resp 18 | Wt 183.2 lb

## 2020-03-03 VITALS — BP 119/58 | HR 82

## 2020-03-03 DIAGNOSIS — C8280 Other types of follicular lymphoma, unspecified site: Secondary | ICD-10-CM

## 2020-03-03 DIAGNOSIS — Z86718 Personal history of other venous thrombosis and embolism: Secondary | ICD-10-CM | POA: Insufficient documentation

## 2020-03-03 DIAGNOSIS — Z86711 Personal history of pulmonary embolism: Secondary | ICD-10-CM

## 2020-03-03 DIAGNOSIS — R161 Splenomegaly, not elsewhere classified: Secondary | ICD-10-CM | POA: Insufficient documentation

## 2020-03-03 DIAGNOSIS — E119 Type 2 diabetes mellitus without complications: Secondary | ICD-10-CM | POA: Insufficient documentation

## 2020-03-03 DIAGNOSIS — C8200 Follicular lymphoma grade I, unspecified site: Secondary | ICD-10-CM | POA: Insufficient documentation

## 2020-03-03 DIAGNOSIS — Z95828 Presence of other vascular implants and grafts: Secondary | ICD-10-CM

## 2020-03-03 DIAGNOSIS — Z79899 Other long term (current) drug therapy: Secondary | ICD-10-CM | POA: Insufficient documentation

## 2020-03-03 DIAGNOSIS — Z8616 Personal history of COVID-19: Secondary | ICD-10-CM | POA: Insufficient documentation

## 2020-03-03 DIAGNOSIS — Z5112 Encounter for antineoplastic immunotherapy: Secondary | ICD-10-CM | POA: Insufficient documentation

## 2020-03-03 DIAGNOSIS — J849 Interstitial pulmonary disease, unspecified: Secondary | ICD-10-CM

## 2020-03-03 DIAGNOSIS — Z7984 Long term (current) use of oral hypoglycemic drugs: Secondary | ICD-10-CM | POA: Insufficient documentation

## 2020-03-03 DIAGNOSIS — R000 Tachycardia, unspecified: Secondary | ICD-10-CM | POA: Diagnosis not present

## 2020-03-03 DIAGNOSIS — Z5111 Encounter for antineoplastic chemotherapy: Secondary | ICD-10-CM

## 2020-03-03 DIAGNOSIS — C8293 Follicular lymphoma, unspecified, intra-abdominal lymph nodes: Secondary | ICD-10-CM

## 2020-03-03 LAB — COMPREHENSIVE METABOLIC PANEL
ALT: 19 U/L (ref 0–44)
AST: 20 U/L (ref 15–41)
Albumin: 4 g/dL (ref 3.5–5.0)
Alkaline Phosphatase: 73 U/L (ref 38–126)
Anion gap: 8 (ref 5–15)
BUN: 15 mg/dL (ref 8–23)
CO2: 27 mmol/L (ref 22–32)
Calcium: 9.5 mg/dL (ref 8.9–10.3)
Chloride: 104 mmol/L (ref 98–111)
Creatinine, Ser: 0.9 mg/dL (ref 0.61–1.24)
GFR, Estimated: >60 mL/min (ref >60)
Glucose, Bld: 110 mg/dL — ABNORMAL HIGH (ref 70–99)
Potassium: 4 mmol/L (ref 3.5–5.1)
Sodium: 139 mmol/L (ref 135–145)
Total Bilirubin: 0.7 mg/dL (ref 0.3–1.2)
Total Protein: 7.1 g/dL (ref 6.5–8.1)

## 2020-03-03 LAB — CBC WITH DIFFERENTIAL/PLATELET
Abs Immature Granulocytes: 0.04 10*3/uL (ref 0.00–0.07)
Basophils Absolute: 0 10*3/uL (ref 0.0–0.1)
Basophils Relative: 1 %
Eosinophils Absolute: 0.2 10*3/uL (ref 0.0–0.5)
Eosinophils Relative: 3 %
HCT: 42 % (ref 39.0–52.0)
Hemoglobin: 14 g/dL (ref 13.0–17.0)
Immature Granulocytes: 1 %
Lymphocytes Relative: 15 %
Lymphs Abs: 1 10*3/uL (ref 0.7–4.0)
MCH: 28.1 pg (ref 26.0–34.0)
MCHC: 33.3 g/dL (ref 30.0–36.0)
MCV: 84.2 fL (ref 80.0–100.0)
Monocytes Absolute: 0.8 10*3/uL (ref 0.1–1.0)
Monocytes Relative: 12 %
Neutro Abs: 4.4 10*3/uL (ref 1.7–7.7)
Neutrophils Relative %: 68 %
Platelets: 211 10*3/uL (ref 150–400)
RBC: 4.99 MIL/uL (ref 4.22–5.81)
RDW: 17.1 % — ABNORMAL HIGH (ref 11.5–15.5)
WBC: 6.4 10*3/uL (ref 4.0–10.5)
nRBC: 0 % (ref 0.0–0.2)

## 2020-03-03 MED ORDER — HEPARIN SOD (PORK) LOCK FLUSH 100 UNIT/ML IV SOLN
500.0000 [IU] | Freq: Once | INTRAVENOUS | Status: AC
  Administered 2020-03-03: 500 [IU] via INTRAVENOUS
  Filled 2020-03-03: qty 5

## 2020-03-03 MED ORDER — DIPHENHYDRAMINE HCL 25 MG PO CAPS
50.0000 mg | ORAL_CAPSULE | Freq: Once | ORAL | Status: AC
  Administered 2020-03-03: 50 mg via ORAL

## 2020-03-03 MED ORDER — SODIUM CHLORIDE 0.9% FLUSH
10.0000 mL | Freq: Once | INTRAVENOUS | Status: AC
  Administered 2020-03-03: 10 mL via INTRAVENOUS
  Filled 2020-03-03: qty 10

## 2020-03-03 MED ORDER — ACETAMINOPHEN 325 MG PO TABS
650.0000 mg | ORAL_TABLET | Freq: Once | ORAL | Status: AC
  Administered 2020-03-03: 650 mg via ORAL

## 2020-03-03 MED ORDER — HEPARIN SOD (PORK) LOCK FLUSH 100 UNIT/ML IV SOLN
INTRAVENOUS | Status: AC
  Filled 2020-03-03: qty 5

## 2020-03-03 MED ORDER — SODIUM CHLORIDE 0.9 % IV SOLN
Freq: Once | INTRAVENOUS | Status: AC
  Filled 2020-03-03: qty 250

## 2020-03-03 MED ORDER — ACYCLOVIR 400 MG PO TABS: 400.0000 mg | ORAL_TABLET | Freq: Two times a day (BID) | ORAL | 0 refills | Status: DC

## 2020-03-03 MED ORDER — SODIUM CHLORIDE 0.9 % IV SOLN
375.0000 mg/m2 | Freq: Once | INTRAVENOUS | Status: AC
  Administered 2020-03-03: 800 mg via INTRAVENOUS
  Filled 2020-03-03: qty 80

## 2020-03-03 NOTE — Progress Notes (Signed)
Patient here for follow up. Pt reports no new issues.

## 2020-03-03 NOTE — Progress Notes (Signed)
Hematology/Oncology Follow Up Note [facility] ([facility])  Telephone:([phone] [phone]  Patient Care Team: Rusty Aus, MD as PCP - General (Internal Medicine)   Name of the patient: Edward Atkinson  [id number]  Jul 11, 1960   REASON FOR VISIT  follow-up for management of pulmonary embolism, follicular cell lymphoma.  PERTINENT ONCOLOGY HISTORY # 01/31/2019 Unprovoked PE,  CT chest PE protocol reviewed bilateral massive pulmonary embolism with right heart strain and patient was started on heparin drip.  Patient was tachypneic and tachycardia.  Vascular surgeon was consulted and the patient was taken to the OR for thrombectomy and thrombolysis.  Patient also had selective catheter placement right upper lobe, middle lobe, lower lobe pulmonary arteries, left upper and lower pulmonary arteries. Ultrasound venous duplex bilateral showed DVT of the left lower extremity with nonocclusive thrombus in the left common femoral vein.  Some of the common femoral venous thrombus does extend just into the GSV across SF J.  No evidence of right lower extremity DVT. Patient was started on heparin drip for anticoagulation. Patient is status post embolectomy by vascular surgery. #Additional CT pancreas was done during that admission which showed peripancreatic/retroperitoneal and contiguous small bowel mesentery nodal mass, retroperitoneal adenopathy, gastrohepatic ligament lymphadenopathy, left abdominal omental nodule CT-guided omentum biopsy was done during hospital.  Patient was switched to Lovenox at discharge.  #  PET scan done on 02/11/2019 which showed large conglomerate retroperitoneal and mesenteric mass with maximal SUV 10.6 compatible with Deauville 5 disease.  Deauville 4 adenopathy in the pelvis. A left axillary lymph node measuring 0.7 cm in short axis has a maximum SUV of 3.3. Mild splenomegaly without focal splenic lesion identified. #02/21/2019, bone marrow biopsy  showed no lymphoma involvement.  There might be lymphocyte aggregates that were being cut away on the deeper levels.  Additional IHC did not show residual echograms.  # admitted from 07/29/2019- 08/09/2019 due to COVID pneumonia. Treated with remdesivir and steroids.  He was discharged and still felt some shortness of breath and was seen by PCP Dr.Elston on 08/09/2019 He was given another course of antibiotics with moxifloxacin 400 mg daily for 10 days, Prednisone tapering and nebulizer.  #08/29/2019 Status post 6 cycles of BR.  Treatment was interrupted during Covid infection March.  INTERVAL HISTORY 59 y.o. male with newly diagnosed pulmonary embolism and follicular lymphoma present for follow-up-evaluation  Patient is on rituximab maintenance every 8 weeks. He reports feeling well today.  No fever, chills, unintentional weight loss, night sweats. Shortness of breath has improved.  He follows up with pulmonology for post COVID-19 infection lung problem. Appetite is fair.  Taste has not recovered yet.  Review of Systems  Constitutional: Negative for appetite change, chills, fatigue, fever and unexpected weight change.  HENT:   Negative for hearing loss and voice change.   Eyes: Negative for eye problems and icterus.  Respiratory: Negative for chest tightness, cough and shortness of breath.   Cardiovascular: Negative for chest pain and leg swelling.  Gastrointestinal: Negative for abdominal distention and abdominal pain.  Endocrine: Negative for hot flashes.  Genitourinary: Negative for difficulty urinating, dysuria and frequency.   Musculoskeletal: Negative for arthralgias.  Skin: Negative for itching and rash.  Neurological: Negative for light-headedness and numbness.  Hematological: Negative for adenopathy. Does not bruise/bleed easily.  Psychiatric/Behavioral: Negative for confusion.      Allergies  Allergen Reactions  . Penicillins Rash    Did it involve swelling of the  face/tongue/throat, SOB, or low BP?  No Did it involve sudden or severe rash/hives, skin peeling, or any reaction on the inside of your mouth or nose? No Did you need to seek medical attention at a hospital or doctor's office? No When did it last happen?Unknown If all above answers are "NO", may proceed with cephalosporin use.     Past Medical History:  Diagnosis Date  . COVID-19   . Diabetes mellitus without complication ([place])   . Diverticulitis   . Follicular lymphoma of intra-abdominal lymph nodes ([place]) 02/15/2019     Past Surgical History:  Procedure Laterality Date  . CHOLECYSTECTOMY    . COLON RESECTION     DUE TO DIVERTICULITIS  . FLEXIBLE BRONCHOSCOPY N/A 12/02/2019   Procedure: FLEXIBLE BRONCHOSCOPY;  Surgeon: Ottie Glazier, MD;  Location: [facility] ORS;  Service: Thoracic;  Laterality: N/A;  . PORTA CATH INSERTION N/A 03/11/2019   Procedure: PORTA CATH INSERTION;  Surgeon: Algernon Huxley, MD;  Location: [place] [facility];  Service: Cardiovascular;  Laterality: N/A;  . PULMONARY THROMBECTOMY N/A 01/29/2019   Procedure: PULMONARY THROMBECTOMY;  Surgeon: Algernon Huxley, MD;  Location: [place] [facility];  Service: Cardiovascular;  Laterality: N/A;    Social History   Socioeconomic History  . Marital status: Married    Spouse name: Not on file  . Number of children: Not on file  . Years of education: Not on file  . Highest education level: Not on file  Occupational History  . Not on file  Tobacco Use  . Smoking status: Never Smoker  . Smokeless tobacco: Never Used  Vaping Use  . Vaping Use: Never used  Substance and Sexual Activity  . Alcohol use: Yes    Alcohol/week: 0.0 - 2.0 standard drinks    Comment: occasional  . Drug use: Never  . Sexual activity: Yes  Other Topics Concern  . Not on file  Social History Narrative  . Not on file   Social Determinants of Health   Financial Resource Strain:   . Difficulty of Paying Living Expenses: Not on file   Food Insecurity:   . Worried About Charity fundraiser in the Last Year: Not on file  . Ran Out of Food in the Last Year: Not on file  Transportation Needs:   . Lack of Transportation (Medical): Not on file  . Lack of Transportation (Non-Medical): Not on file  Physical Activity:   . Days of Exercise per Week: Not on file  . Minutes of Exercise per Session: Not on file  Stress:   . Feeling of Stress : Not on file  Social Connections:   . Frequency of Communication with Friends and Family: Not on file  . Frequency of Social Gatherings with Friends and Family: Not on file  . Attends Religious Services: Not on file  . Active Member of Clubs or Organizations: Not on file  . Attends Archivist Meetings: Not on file  . Marital Status: Not on file  Intimate Partner Violence:   . Fear of Current or Ex-Partner: Not on file  . Emotionally Abused: Not on file  . Physically Abused: Not on file  . Sexually Abused: Not on file    Family History  Problem Relation Age of Onset  . Diabetes Brother      Current Outpatient Medications:  .  ADVAIR DISKUS 100-50 MCG/DOSE AEPB, Inhale 1 puff into the lungs 2 (two) times daily. , Disp: , Rfl:  .  albuterol (PROVENTIL) (2.5 MG/3ML) 0.083% nebulizer solution,  Take 2.5 mg by nebulization in the morning, at noon, and at bedtime. , Disp: , Rfl:  .  albuterol (VENTOLIN HFA) 108 (90 Base) MCG/ACT inhaler, 2 puffs q.i.d. p.r.n. short of breath, wheezing, or cough, Disp: , Rfl:  .  BREZTRI AEROSPHERE 160-9-4.8 MCG/ACT AERO, TWO INHALATIONS INTO LUNGS TWICE DAILY, Disp: , Rfl:  .  budesonide (PULMICORT) 1 MG/2ML nebulizer solution, Take 1 mg by nebulization every morning. , Disp: , Rfl:  .  Colchicine (MITIGARE) 0.6 MG CAPS, Take 0.6 mg by mouth at bedtime. , Disp: , Rfl:  .  dextromethorphan-guaiFENesin (MUCINEX DM) 30-600 MG 12hr tablet, Take 1 tablet by mouth 2 (two) times daily as needed for cough. , Disp: , Rfl:  .  ELIQUIS 2.5 MG TABS tablet,  TAKE 1 TABLET BY MOUTH TWICE A DAY, Disp: 60 tablet, Rfl: 3 .  glimepiride (AMARYL) 4 MG tablet, Take 4 mg by mouth daily with breakfast., Disp: , Rfl:  .  lidocaine-prilocaine (EMLA) cream, Apply 1 application topically as needed., Disp: 30 g, Rfl: 1 .  metFORMIN (GLUCOPHAGE) 500 MG tablet, Take 500 mg by mouth 3 (three) times daily with meals. , Disp: , Rfl:  .  montelukast (SINGULAIR) 10 MG tablet, Take 10 mg by mouth at bedtime. , Disp: , Rfl:  .  prochlorperazine (COMPAZINE) 10 MG tablet, Take 1 tablet (10 mg total) by mouth every 6 (six) hours as needed (Nausea or vomiting)., Disp: 30 tablet, Rfl: 1 .  acyclovir (ZOVIRAX) 400 MG tablet, Take 1 tablet (400 mg total) by mouth 2 (two) times daily., Disp: 180 tablet, Rfl: 0 No current facility-administered medications for this visit.  Facility-Administered Medications Ordered in Other Visits:  .  heparin lock flush 100 unit/mL, 500 Units, Intravenous, Once, Earlie Server, MD .  sodium chloride flush (NS) 0.9 % injection 10 mL, 10 mL, Intravenous, Once, Earlie Server, MD  Physical exam:  Vitals:   03/03/20 0840  BP: (!) 128/91  Pulse: 84  Resp: 18  Temp: (!) 97.3 F (36.3 C)  Weight: 183 lb 3.2 oz (83.1 kg)   Physical Exam Constitutional:      General: He is not in acute distress. HENT:     Head: Normocephalic and atraumatic.  Eyes:     General: No scleral icterus. Cardiovascular:     Rate and Rhythm: Normal rate and regular rhythm.     Heart sounds: Normal heart sounds.  Pulmonary:     Effort: Pulmonary effort is normal. No respiratory distress.     Breath sounds: Normal breath sounds. No wheezing.  Abdominal:     General: Bowel sounds are normal. There is no distension.     Palpations: Abdomen is soft.  Musculoskeletal:        General: No deformity. Normal range of motion.     Cervical back: Normal range of motion and neck supple.  Skin:    General: Skin is warm and dry.     Findings: No erythema or rash.  Neurological:      Mental Status: He is alert and oriented to person, place, and time. Mental status is at baseline.     Cranial Nerves: No cranial nerve deficit.     Coordination: Coordination normal.  Psychiatric:        Mood and Affect: Mood normal.     CMP Latest Ref Rng & Units 03/03/2020  Glucose 70 - 99 mg/dL 110(H)  BUN 8 - 23 mg/dL 15  Creatinine 0.61 - 1.24 mg/dL 0.90  Sodium 135 - 145 mmol/L 139  Potassium 3.5 - 5.1 mmol/L 4.0  Chloride 98 - 111 mmol/L 104  CO2 22 - 32 mmol/L 27  Calcium 8.9 - 10.3 mg/dL 9.5  Total Protein 6.5 - 8.1 g/dL 7.1  Total Bilirubin 0.3 - 1.2 mg/dL 0.7  Alkaline Phos 38 - 126 U/L 73  AST 15 - 41 U/L 20  ALT 0 - 44 U/L 19   CBC Latest Ref Rng & Units 03/03/2020  WBC 4.0 - 10.5 K/uL 6.4  Hemoglobin 13.0 - 17.0 g/dL 14.0  Hematocrit 39 - 52 % 42.0  Platelets 150 - 400 K/uL 211   RADIOGRAPHIC STUDIES: I have personally reviewed the radiological images as listed and agreed with the findings in the report. No results found.   Assessment and plan 1. 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HCC)   2. Port-A-Cath in place   3. Encounter for antineoplastic chemotherapy   4. History of pulmonary embolism   5. History of DVT (deep vein thrombosis)   6. Interstitial lung disease (HCC)    Stage III follicular lymphoma Status post 6 cycles of BR, good response. Complete remission,  Given his bulky disease, recommend patient go on rituximab maintenance every 8 weeks for 2 years if he is able to tolerate.  Labs are reviewed and discussed with patient. Counts acceptable. Proceed with rituximab today. Recommend patient to continue acyclovir 400 mg twice daily for shingle prophylaxis.   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  #History of DVT and PE, continue Eliquis for anticoagulation, continue Eliquis 2.5 mg twice daily.   Earlie Server, MD, PhD Hematology Oncology [facility] at [facility] Pager-  [phone] 03/03/2020

## 2020-03-03 NOTE — Progress Notes (Signed)
Pt stable at time of discharge. 

## 2020-04-07 ENCOUNTER — Other Ambulatory Visit: Payer: Self-pay | Admitting: Oncology

## 2020-04-28 ENCOUNTER — Inpatient Hospital Stay (HOSPITAL_BASED_OUTPATIENT_CLINIC_OR_DEPARTMENT_OTHER): Payer: BC Managed Care – PPO | Admitting: Oncology

## 2020-04-28 ENCOUNTER — Encounter: Payer: Self-pay | Admitting: Oncology

## 2020-04-28 ENCOUNTER — Inpatient Hospital Stay: Payer: BC Managed Care – PPO

## 2020-04-28 ENCOUNTER — Inpatient Hospital Stay: Payer: BC Managed Care – PPO | Attending: Oncology

## 2020-04-28 VITALS — BP 141/89 | HR 94 | Temp 99.2°F | Resp 18 | Wt 198.2 lb

## 2020-04-28 VITALS — BP 124/79 | HR 87 | Resp 18 | Ht 72.0 in

## 2020-04-28 DIAGNOSIS — Z86711 Personal history of pulmonary embolism: Secondary | ICD-10-CM

## 2020-04-28 DIAGNOSIS — R0682 Tachypnea, not elsewhere classified: Secondary | ICD-10-CM | POA: Diagnosis not present

## 2020-04-28 DIAGNOSIS — Z86718 Personal history of other venous thrombosis and embolism: Secondary | ICD-10-CM | POA: Diagnosis not present

## 2020-04-28 DIAGNOSIS — Z5111 Encounter for antineoplastic chemotherapy: Secondary | ICD-10-CM

## 2020-04-28 DIAGNOSIS — Z7984 Long term (current) use of oral hypoglycemic drugs: Secondary | ICD-10-CM | POA: Diagnosis not present

## 2020-04-28 DIAGNOSIS — Z8616 Personal history of COVID-19: Secondary | ICD-10-CM | POA: Diagnosis not present

## 2020-04-28 DIAGNOSIS — C8293 Follicular lymphoma, unspecified, intra-abdominal lymph nodes: Secondary | ICD-10-CM

## 2020-04-28 DIAGNOSIS — Z7901 Long term (current) use of anticoagulants: Secondary | ICD-10-CM | POA: Diagnosis not present

## 2020-04-28 DIAGNOSIS — R000 Tachycardia, unspecified: Secondary | ICD-10-CM | POA: Insufficient documentation

## 2020-04-28 DIAGNOSIS — Z79899 Other long term (current) drug therapy: Secondary | ICD-10-CM | POA: Diagnosis not present

## 2020-04-28 DIAGNOSIS — J849 Interstitial pulmonary disease, unspecified: Secondary | ICD-10-CM

## 2020-04-28 DIAGNOSIS — C8280 Other types of follicular lymphoma, unspecified site: Secondary | ICD-10-CM

## 2020-04-28 LAB — COMPREHENSIVE METABOLIC PANEL
ALT: 16 U/L (ref 0–44)
AST: 21 U/L (ref 15–41)
Albumin: 3.9 g/dL (ref 3.5–5.0)
Alkaline Phosphatase: 72 U/L (ref 38–126)
Anion gap: 11 (ref 5–15)
BUN: 19 mg/dL (ref 8–23)
CO2: 25 mmol/L (ref 22–32)
Calcium: 9.2 mg/dL (ref 8.9–10.3)
Chloride: 101 mmol/L (ref 98–111)
Creatinine, Ser: 0.77 mg/dL (ref 0.61–1.24)
GFR, Estimated: >60 mL/min (ref >60)
Glucose, Bld: 128 mg/dL — ABNORMAL HIGH (ref 70–99)
Potassium: 4.1 mmol/L (ref 3.5–5.1)
Sodium: 137 mmol/L (ref 135–145)
Total Bilirubin: 0.8 mg/dL (ref 0.3–1.2)
Total Protein: 7.1 g/dL (ref 6.5–8.1)

## 2020-04-28 LAB — CBC WITH DIFFERENTIAL/PLATELET
Abs Immature Granulocytes: 0.07 10*3/uL (ref 0.00–0.07)
Basophils Absolute: 0 10*3/uL (ref 0.0–0.1)
Basophils Relative: 1 %
Eosinophils Absolute: 0.1 10*3/uL (ref 0.0–0.5)
Eosinophils Relative: 2 %
HCT: 42.4 % (ref 39.0–52.0)
Hemoglobin: 14.5 g/dL (ref 13.0–17.0)
Immature Granulocytes: 1 %
Lymphocytes Relative: 12 %
Lymphs Abs: 0.8 10*3/uL (ref 0.7–4.0)
MCH: 30.1 pg (ref 26.0–34.0)
MCHC: 34.2 g/dL (ref 30.0–36.0)
MCV: 88 fL (ref 80.0–100.0)
Monocytes Absolute: 0.7 10*3/uL (ref 0.1–1.0)
Monocytes Relative: 11 %
Neutro Abs: 4.6 10*3/uL (ref 1.7–7.7)
Neutrophils Relative %: 73 %
Platelets: 190 10*3/uL (ref 150–400)
RBC: 4.82 MIL/uL (ref 4.22–5.81)
RDW: 15 % (ref 11.5–15.5)
WBC: 6.3 10*3/uL (ref 4.0–10.5)
nRBC: 0 % (ref 0.0–0.2)

## 2020-04-28 MED ORDER — DIPHENHYDRAMINE HCL 25 MG PO CAPS
50.0000 mg | ORAL_CAPSULE | Freq: Once | ORAL | Status: AC
  Administered 2020-04-28: 10:00:00 50 mg via ORAL
  Filled 2020-04-28: qty 2

## 2020-04-28 MED ORDER — ACYCLOVIR 400 MG PO TABS: 400.0000 mg | ORAL_TABLET | Freq: Two times a day (BID) | ORAL | 1 refills | Status: DC

## 2020-04-28 MED ORDER — ACETAMINOPHEN 325 MG PO TABS
650.0000 mg | ORAL_TABLET | Freq: Once | ORAL | Status: AC
  Administered 2020-04-28: 10:00:00 650 mg via ORAL
  Filled 2020-04-28: qty 2

## 2020-04-28 MED ORDER — SODIUM CHLORIDE 0.9% FLUSH
10.0000 mL | INTRAVENOUS | Status: DC | PRN
  Administered 2020-04-28: 08:00:00 10 mL via INTRAVENOUS
  Filled 2020-04-28: qty 10

## 2020-04-28 MED ORDER — SODIUM CHLORIDE 0.9 % IV SOLN
375.0000 mg/m2 | Freq: Once | INTRAVENOUS | Status: AC
  Administered 2020-04-28: 10:00:00 800 mg via INTRAVENOUS
  Filled 2020-04-28: qty 50

## 2020-04-28 MED ORDER — HEPARIN SOD (PORK) LOCK FLUSH 100 UNIT/ML IV SOLN
INTRAVENOUS | Status: AC
  Filled 2020-04-28: qty 5

## 2020-04-28 MED ORDER — SODIUM CHLORIDE 0.9 % IV SOLN
Freq: Once | INTRAVENOUS | Status: AC
  Filled 2020-04-28: qty 250

## 2020-04-28 MED ORDER — HEPARIN SOD (PORK) LOCK FLUSH 100 UNIT/ML IV SOLN
500.0000 [IU] | Freq: Once | INTRAVENOUS | Status: AC
  Administered 2020-04-28: 12:00:00 500 [IU] via INTRAVENOUS
  Filled 2020-04-28: qty 5

## 2020-04-28 NOTE — Progress Notes (Signed)
Patient denies new problems/concerns today.   °

## 2020-04-28 NOTE — Progress Notes (Signed)
Hematology/Oncology Follow Up Note [facility]  [phone] [phone]  Patient Care Team: Rusty Aus, MD as PCP - General (Internal Medicine) Earlie Server, MD as Consulting Physician (Hematology and Oncology)   Name of the patient: Edward Atkinson  [id number]  [birth year]   REASON FOR VISIT  follow-up for management of pulmonary embolism, follicular cell lymphoma.  PERTINENT ONCOLOGY HISTORY # 01/31/2019 Unprovoked PE,  CT chest PE protocol reviewed bilateral massive pulmonary embolism with right heart strain and patient was started on heparin drip.  Patient was tachypneic and tachycardia.  Vascular surgeon was consulted and the patient was taken to the OR for thrombectomy and thrombolysis.  Patient also had selective catheter placement right upper lobe, middle lobe, lower lobe pulmonary arteries, left upper and lower pulmonary arteries. Ultrasound venous duplex bilateral showed DVT of the left lower extremity with nonocclusive thrombus in the left common femoral vein.  Some of the common femoral venous thrombus does extend just into the GSV across SF J.  No evidence of right lower extremity DVT. Patient was started on heparin drip for anticoagulation. Patient is status post embolectomy by vascular surgery. #Additional CT pancreas was done during that admission which showed peripancreatic/retroperitoneal and contiguous small bowel mesentery nodal mass, retroperitoneal adenopathy, gastrohepatic ligament lymphadenopathy, left abdominal omental nodule CT-guided omentum biopsy was done during hospital.  Patient was switched to Lovenox at discharge.  #  PET scan done on 02/11/2019 which showed large conglomerate retroperitoneal and mesenteric mass with maximal SUV 10.6 compatible with Deauville 5 disease.  Deauville 4 adenopathy in the pelvis. A left axillary lymph node measuring 0.7 cm in short axis has a maximum SUV of 3.3. Mild splenomegaly without  focal splenic lesion identified. #02/21/2019, bone marrow biopsy showed no lymphoma involvement.  There might be lymphocyte aggregates that were being cut away on the deeper levels.  Additional IHC did not show residual echograms.  # admitted from 07/29/2019- 08/09/2019 due to COVID pneumonia. Treated with remdesivir and steroids.  He was discharged and still felt some shortness of breath and was seen by PCP Dr.Elston on 08/09/2019 He was given another course of antibiotics with moxifloxacin 400 mg daily for 10 days, Prednisone tapering and nebulizer.  #08/29/2019 Status post 6 cycles of BR.  Treatment was interrupted during Covid infection March.  INTERVAL HISTORY 59 y.o. male with newly diagnosed pulmonary embolism and follicular lymphoma present for follow-up-evaluation  Patient is on rituximab maintenance every 8 weeks.  Patient tolerates well He reports the breathing status has significantly improved Denies any fever, chills, unintentional weight loss, night sweats. He has gained weight since last visit, 15 pounds. Appetite is good Shortness of breath has improved.  Occasionally he coughs. He follows up with pulmonology for post COVID-19 infection lung problems. Patient also reports that he has received COVID-19 vaccination booster   Review of Systems  Constitutional: Negative for appetite change, chills, fatigue, fever and unexpected weight change.  HENT:   Negative for hearing loss and voice change.   Eyes: Negative for eye problems and icterus.  Respiratory: Negative for chest tightness, cough and shortness of breath.   Cardiovascular: Negative for chest pain and leg swelling.  Gastrointestinal: Negative for abdominal distention and abdominal pain.  Endocrine: Negative for hot flashes.  Genitourinary: Negative for difficulty urinating, dysuria and frequency.   Musculoskeletal: Negative for arthralgias.  Skin: Negative for itching and rash.  Neurological: Negative for  light-headedness and numbness.  Hematological: Negative for adenopathy. Does not bruise/bleed  easily.  Psychiatric/Behavioral: Negative for confusion.      Allergies  Allergen Reactions  . Penicillins Rash    Did it involve swelling of the face/tongue/throat, SOB, or low BP? No Did it involve sudden or severe rash/hives, skin peeling, or any reaction on the inside of your mouth or nose? No Did you need to seek medical attention at a hospital or doctor's office? No When did it last happen?Unknown If all above answers are "NO", may proceed with cephalosporin use.     Past Medical History:  Diagnosis Date  . COVID-19   . Diabetes mellitus without complication ([place])   . Diverticulitis   . Follicular lymphoma of intra-abdominal lymph nodes ([place]) 02/15/2019     Past Surgical History:  Procedure Laterality Date  . CHOLECYSTECTOMY    . COLON RESECTION     DUE TO DIVERTICULITIS  . FLEXIBLE BRONCHOSCOPY N/A 12/02/2019   Procedure: FLEXIBLE BRONCHOSCOPY;  Surgeon: Ottie Glazier, MD;  Location: [facility] ORS;  Service: Thoracic;  Laterality: N/A;  . PORTA CATH INSERTION N/A 03/11/2019   Procedure: PORTA CATH INSERTION;  Surgeon: Algernon Huxley, MD;  Location: [place] [facility];  Service: Cardiovascular;  Laterality: N/A;  . PULMONARY THROMBECTOMY N/A 01/29/2019   Procedure: PULMONARY THROMBECTOMY;  Surgeon: Algernon Huxley, MD;  Location: [place] [facility];  Service: Cardiovascular;  Laterality: N/A;    Social History   Socioeconomic History  . Marital status: Married    Spouse name: Not on file  . Number of children: Not on file  . Years of education: Not on file  . Highest education level: Not on file  Occupational History  . Not on file  Tobacco Use  . Smoking status: Never Smoker  . Smokeless tobacco: Never Used  Vaping Use  . Vaping Use: Never used  Substance and Sexual Activity  . Alcohol use: Yes    Alcohol/week: 0.0 - 2.0 standard drinks    Comment: occasional  .  Drug use: Never  . Sexual activity: Yes  Other Topics Concern  . Not on file  Social History Narrative  . Not on file   Social Determinants of Health   Financial Resource Strain: Not on file  Food Insecurity: Not on file  Transportation Needs: Not on file  Physical Activity: Not on file  Stress: Not on file  Social Connections: Not on file  Intimate Partner Violence: Not on file    Family History  Problem Relation Age of Onset  . Diabetes Brother      Current Outpatient Medications:  .  acyclovir (ZOVIRAX) 400 MG tablet, Take 1 tablet (400 mg total) by mouth 2 (two) times daily., Disp: 180 tablet, Rfl: 0 .  ADVAIR DISKUS 100-50 MCG/DOSE AEPB, Inhale 1 puff into the lungs 2 (two) times daily. , Disp: , Rfl:  .  albuterol (PROVENTIL) (2.5 MG/3ML) 0.083% nebulizer solution, Take 2.5 mg by nebulization in the morning, at noon, and at bedtime. , Disp: , Rfl:  .  albuterol (VENTOLIN HFA) 108 (90 Base) MCG/ACT inhaler, 2 puffs q.i.d. p.r.n. short of breath, wheezing, or cough, Disp: , Rfl:  .  BREZTRI AEROSPHERE 160-9-4.8 MCG/ACT AERO, TWO INHALATIONS INTO LUNGS TWICE DAILY, Disp: , Rfl:  .  budesonide (PULMICORT) 1 MG/2ML nebulizer solution, Take 1 mg by nebulization every morning. , Disp: , Rfl:  .  Colchicine (MITIGARE) 0.6 MG CAPS, Take 0.6 mg by mouth at bedtime. , Disp: , Rfl:  .  dextromethorphan-guaiFENesin ([place] DM) 30-600 MG 12hr  tablet, Take 1 tablet by mouth 2 (two) times daily as needed for cough. , Disp: , Rfl:  .  ELIQUIS 2.5 MG TABS tablet, TAKE 1 TABLET BY MOUTH TWICE A DAY, Disp: 60 tablet, Rfl: 3 .  glimepiride (AMARYL) 4 MG tablet, Take 4 mg by mouth daily with breakfast., Disp: , Rfl:  .  lidocaine-prilocaine (EMLA) cream, Apply 1 application topically as needed., Disp: 30 g, Rfl: 1 .  metFORMIN (GLUCOPHAGE) 500 MG tablet, Take 500 mg by mouth 3 (three) times daily with meals. , Disp: , Rfl:  .  montelukast (SINGULAIR) 10 MG tablet, Take 10 mg by mouth at  bedtime. , Disp: , Rfl:  .  prochlorperazine (COMPAZINE) 10 MG tablet, Take 1 tablet (10 mg total) by mouth every 6 (six) hours as needed (Nausea or vomiting)., Disp: 30 tablet, Rfl: 1 No current facility-administered medications for this visit.  Facility-Administered Medications Ordered in Other Visits:  .  heparin lock flush 100 unit/mL, 500 Units, Intravenous, Once, Earlie Server, MD .  sodium chloride flush (NS) 0.9 % injection 10 mL, 10 mL, Intravenous, PRN, Earlie Server, MD, 10 mL at 04/28/20 0803  Physical exam:  Vitals:   04/28/20 0838  BP: (!) 141/89  Pulse: 94  Resp: 18  Temp: 99.2 F (37.3 C)  Weight: 198 lb 3.2 oz (89.9 kg)   Physical Exam Constitutional:      General: He is not in acute distress. HENT:     Head: Normocephalic and atraumatic.  Eyes:     General: No scleral icterus. Cardiovascular:     Rate and Rhythm: Normal rate and regular rhythm.     Heart sounds: Normal heart sounds.  Pulmonary:     Effort: Pulmonary effort is normal. No respiratory distress.     Breath sounds: Normal breath sounds. No wheezing.  Abdominal:     General: Bowel sounds are normal. There is no distension.     Palpations: Abdomen is soft.  Musculoskeletal:        General: No deformity. Normal range of motion.     Cervical back: Normal range of motion and neck supple.  Skin:    General: Skin is warm and dry.     Findings: No erythema or rash.  Neurological:     Mental Status: He is alert and oriented to person, place, and time. Mental status is at baseline.     Cranial Nerves: No cranial nerve deficit.     Coordination: Coordination normal.  Psychiatric:        Mood and Affect: Mood normal.     CMP Latest Ref Rng & Units 03/03/2020  Glucose 70 - 99 mg/dL 110(H)  BUN 8 - 23 mg/dL 15  Creatinine 0.61 - 1.24 mg/dL 0.90  Sodium 135 - 145 mmol/L 139  Potassium 3.5 - 5.1 mmol/L 4.0  Chloride 98 - 111 mmol/L 104  CO2 22 - 32 mmol/L 27  Calcium 8.9 - 10.3 mg/dL 9.5  Total Protein 6.5  - 8.1 g/dL 7.1  Total Bilirubin 0.3 - 1.2 mg/dL 0.7  Alkaline Phos 38 - 126 U/L 73  AST 15 - 41 U/L 20  ALT 0 - 44 U/L 19   CBC Latest Ref Rng & Units 03/03/2020  WBC 4.0 - 10.5 K/uL 6.4  Hemoglobin 13.0 - 17.0 g/dL 14.0  Hematocrit 39.0 - 52.0 % 42.0  Platelets 150 - 400 K/uL 211   RADIOGRAPHIC STUDIES: I have personally reviewed the radiological images as listed and agreed with the  findings in the report. No results found.   Assessment and plan 1. Encounter for antineoplastic chemotherapy   2. Follicular lymphoma of intra-abdominal lymph nodes, unspecified follicular lymphoma type ([place])   3. History of pulmonary embolism   4. History of DVT (deep vein thrombosis)   5. Interstitial lung disease (HCC)    Stage III follicular lymphoma Status post 6 cycles of BR, good response. Complete remission Due to his initial bulky disease, currently on rituximab maintenance every 8 weeks for 2 years if he is able to tolerate.  So far he tolerates well .  Labs are reviewed and discussed with patient. Counts acceptable to proceed with rituximab today. Continue acyclovir 400 mg twice daily for shingle prophylaxis   #History of PE and DVT Continue Eliquis 2.5 mg twice daily.   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  Improved.  Follow-up in 8 weeks, lab MD rituximab. Earlie Server, MD, PhD Hematology Oncology [facility] at [facility] Pager- [phone] 04/28/2020

## 2020-04-28 NOTE — Progress Notes (Signed)
1205- Patient tolerated treatment well. Patient and vital signs stable. Patient discharged to home at this time.

## 2020-06-22 ENCOUNTER — Telehealth: Payer: Self-pay | Admitting: Oncology

## 2020-06-22 NOTE — Telephone Encounter (Signed)
06/22/2020 Pt called and asked to r/s appts on 06/23/20 due to him just getting over having pneumonia. Appts r/s for 07/02/20 per pt request SRW

## 2020-06-23 ENCOUNTER — Inpatient Hospital Stay: Payer: BC Managed Care – PPO

## 2020-06-23 ENCOUNTER — Inpatient Hospital Stay: Payer: BC Managed Care – PPO | Admitting: Oncology

## 2020-07-01 ENCOUNTER — Other Ambulatory Visit: Payer: Self-pay

## 2020-07-01 DIAGNOSIS — C8293 Follicular lymphoma, unspecified, intra-abdominal lymph nodes: Secondary | ICD-10-CM

## 2020-07-02 ENCOUNTER — Inpatient Hospital Stay: Payer: BC Managed Care – PPO | Attending: Oncology

## 2020-07-02 ENCOUNTER — Inpatient Hospital Stay: Payer: BC Managed Care – PPO

## 2020-07-02 ENCOUNTER — Other Ambulatory Visit: Payer: Self-pay

## 2020-07-02 ENCOUNTER — Encounter: Payer: Self-pay | Admitting: Oncology

## 2020-07-02 ENCOUNTER — Inpatient Hospital Stay (HOSPITAL_BASED_OUTPATIENT_CLINIC_OR_DEPARTMENT_OTHER): Payer: BC Managed Care – PPO | Admitting: Oncology

## 2020-07-02 VITALS — BP 123/86 | HR 96 | Temp 98.6°F | Resp 18 | Wt 180.2 lb

## 2020-07-02 DIAGNOSIS — Z7984 Long term (current) use of oral hypoglycemic drugs: Secondary | ICD-10-CM | POA: Diagnosis not present

## 2020-07-02 DIAGNOSIS — Z86711 Personal history of pulmonary embolism: Secondary | ICD-10-CM | POA: Diagnosis not present

## 2020-07-02 DIAGNOSIS — Z86718 Personal history of other venous thrombosis and embolism: Secondary | ICD-10-CM | POA: Insufficient documentation

## 2020-07-02 DIAGNOSIS — C8293 Follicular lymphoma, unspecified, intra-abdominal lymph nodes: Secondary | ICD-10-CM

## 2020-07-02 DIAGNOSIS — R000 Tachycardia, unspecified: Secondary | ICD-10-CM | POA: Insufficient documentation

## 2020-07-02 DIAGNOSIS — Z5112 Encounter for antineoplastic immunotherapy: Secondary | ICD-10-CM | POA: Insufficient documentation

## 2020-07-02 DIAGNOSIS — Z7951 Long term (current) use of inhaled steroids: Secondary | ICD-10-CM | POA: Diagnosis not present

## 2020-07-02 DIAGNOSIS — Z79899 Other long term (current) drug therapy: Secondary | ICD-10-CM | POA: Diagnosis not present

## 2020-07-02 DIAGNOSIS — E119 Type 2 diabetes mellitus without complications: Secondary | ICD-10-CM | POA: Diagnosis not present

## 2020-07-02 DIAGNOSIS — R161 Splenomegaly, not elsewhere classified: Secondary | ICD-10-CM | POA: Insufficient documentation

## 2020-07-02 DIAGNOSIS — R0682 Tachypnea, not elsewhere classified: Secondary | ICD-10-CM | POA: Diagnosis not present

## 2020-07-02 DIAGNOSIS — J849 Interstitial pulmonary disease, unspecified: Secondary | ICD-10-CM | POA: Insufficient documentation

## 2020-07-02 DIAGNOSIS — C8280 Other types of follicular lymphoma, unspecified site: Secondary | ICD-10-CM

## 2020-07-02 DIAGNOSIS — Z8616 Personal history of COVID-19: Secondary | ICD-10-CM | POA: Diagnosis not present

## 2020-07-02 LAB — COMPREHENSIVE METABOLIC PANEL WITH GFR
ALT: 13 U/L (ref 0–44)
AST: 17 U/L (ref 15–41)
Albumin: 4 g/dL (ref 3.5–5.0)
Alkaline Phosphatase: 74 U/L (ref 38–126)
Anion gap: 12 (ref 5–15)
BUN: 13 mg/dL (ref 8–23)
CO2: 24 mmol/L (ref 22–32)
Calcium: 9.1 mg/dL (ref 8.9–10.3)
Chloride: 100 mmol/L (ref 98–111)
Creatinine, Ser: 0.86 mg/dL (ref 0.61–1.24)
GFR, Estimated: >60 mL/min
Glucose, Bld: 102 mg/dL — ABNORMAL HIGH (ref 70–99)
Potassium: 4 mmol/L (ref 3.5–5.1)
Sodium: 136 mmol/L (ref 135–145)
Total Bilirubin: 0.8 mg/dL (ref 0.3–1.2)
Total Protein: 7.2 g/dL (ref 6.5–8.1)

## 2020-07-02 LAB — CBC WITH DIFFERENTIAL/PLATELET
Abs Immature Granulocytes: 0.06 K/uL (ref 0.00–0.07)
Basophils Absolute: 0 K/uL (ref 0.0–0.1)
Basophils Relative: 1 %
Eosinophils Absolute: 0 K/uL (ref 0.0–0.5)
Eosinophils Relative: 1 %
HCT: 38.6 % — ABNORMAL LOW (ref 39.0–52.0)
Hemoglobin: 13 g/dL (ref 13.0–17.0)
Immature Granulocytes: 1 %
Lymphocytes Relative: 15 %
Lymphs Abs: 0.9 K/uL (ref 0.7–4.0)
MCH: 30.2 pg (ref 26.0–34.0)
MCHC: 33.7 g/dL (ref 30.0–36.0)
MCV: 89.6 fL (ref 80.0–100.0)
Monocytes Absolute: 0.8 K/uL (ref 0.1–1.0)
Monocytes Relative: 13 %
Neutro Abs: 4.2 K/uL (ref 1.7–7.7)
Neutrophils Relative %: 69 %
Platelets: 253 K/uL (ref 150–400)
RBC: 4.31 MIL/uL (ref 4.22–5.81)
RDW: 13.6 % (ref 11.5–15.5)
WBC: 5.9 K/uL (ref 4.0–10.5)
nRBC: 0 % (ref 0.0–0.2)

## 2020-07-02 MED ORDER — SODIUM CHLORIDE 0.9 % IV SOLN
Freq: Once | INTRAVENOUS | Status: AC
Start: 2020-07-02 — End: 2020-07-02
  Filled 2020-07-02: qty 250

## 2020-07-02 MED ORDER — SODIUM CHLORIDE 0.9% FLUSH
10.0000 mL | INTRAVENOUS | Status: DC | PRN
  Filled 2020-07-02: qty 10

## 2020-07-02 MED ORDER — SODIUM CHLORIDE 0.9 % IV SOLN
375.0000 mg/m2 | Freq: Once | INTRAVENOUS | Status: AC
  Administered 2020-07-02: 800 mg via INTRAVENOUS
  Filled 2020-07-02: qty 80

## 2020-07-02 MED ORDER — DIPHENHYDRAMINE HCL 25 MG PO CAPS
50.0000 mg | ORAL_CAPSULE | Freq: Once | ORAL | Status: AC
  Administered 2020-07-02: 50 mg via ORAL
  Filled 2020-07-02: qty 2

## 2020-07-02 MED ORDER — HEPARIN SOD (PORK) LOCK FLUSH 100 UNIT/ML IV SOLN
INTRAVENOUS | Status: AC
  Filled 2020-07-02: qty 5

## 2020-07-02 MED ORDER — HEPARIN SOD (PORK) LOCK FLUSH 100 UNIT/ML IV SOLN
500.0000 [IU] | Freq: Once | INTRAVENOUS | Status: AC | PRN
  Administered 2020-07-02: 500 [IU]
  Filled 2020-07-02: qty 5

## 2020-07-02 MED ORDER — ACETAMINOPHEN 325 MG PO TABS
650.0000 mg | ORAL_TABLET | Freq: Once | ORAL | Status: AC
  Administered 2020-07-02: 650 mg via ORAL
  Filled 2020-07-02: qty 2

## 2020-07-02 NOTE — Progress Notes (Signed)
Pt here for follow up. No new concerns voiced.   

## 2020-07-02 NOTE — Progress Notes (Signed)
Rituxan titrated per policy.  Pt tolerated his infusion well today with no problems or complaints.  Pt left infusion suite stable and ambulatory.

## 2020-07-02 NOTE — Progress Notes (Signed)
Hematology/Oncology Follow Up Note [facility]  [phone] [phone]  Patient Care Team: Rusty Aus, MD as PCP - General (Internal Medicine) Earlie Server, MD as Consulting Physician (Hematology and Oncology)   Name of the patient: Edward Atkinson  [id number]  Aug 08, 1960   REASON FOR VISIT  follow-up for management of pulmonary embolism, follicular cell lymphoma.  PERTINENT ONCOLOGY HISTORY # 01/31/2019 Unprovoked PE,  CT chest PE protocol reviewed bilateral massive pulmonary embolism with right heart strain and patient was started on heparin drip.  Patient was tachypneic and tachycardia.  Vascular surgeon was consulted and the patient was taken to the OR for thrombectomy and thrombolysis.  Patient also had selective catheter placement right upper lobe, middle lobe, lower lobe pulmonary arteries, left upper and lower pulmonary arteries. Ultrasound venous duplex bilateral showed DVT of the left lower extremity with nonocclusive thrombus in the left common femoral vein.  Some of the common femoral venous thrombus does extend just into the GSV across SF J.  No evidence of right lower extremity DVT. Patient was started on heparin drip for anticoagulation. Patient is status post embolectomy by vascular surgery. #Additional CT pancreas was done during that admission which showed peripancreatic/retroperitoneal and contiguous small bowel mesentery nodal mass, retroperitoneal adenopathy, gastrohepatic ligament lymphadenopathy, left abdominal omental nodule CT-guided omentum biopsy was done during hospital.  Patient was switched to Lovenox at discharge.  #  PET scan done on 02/11/2019 which showed large conglomerate retroperitoneal and mesenteric mass with maximal SUV 10.6 compatible with Deauville 5 disease.  Deauville 4 adenopathy in the pelvis. A left axillary lymph node measuring 0.7 cm in short axis has a maximum SUV of 3.3. Mild splenomegaly without  focal splenic lesion identified. #02/21/2019, bone marrow biopsy showed no lymphoma involvement.  There might be lymphocyte aggregates that were being cut away on the deeper levels.  Additional IHC did not show residual echograms.  # admitted from 07/29/2019- 08/09/2019 due to COVID pneumonia. Treated with remdesivir and steroids.  He was discharged and still felt some shortness of breath and was seen by PCP Dr.Elston on 08/09/2019 He was given another course of antibiotics with moxifloxacin 400 mg daily for 10 days, Prednisone tapering and nebulizer.  #08/29/2019 Status post 6 cycles of BR.  Treatment was interrupted during Covid infection March.  INTERVAL HISTORY 60 y.o. male with newly diagnosed pulmonary embolism and follicular lymphoma present for follow-up-evaluation  Patient is on rituximab maintenance every 8 weeks.  Patient tolerates well Community acquired pneumonia in Jan 2022, treated with antibiotics and symptoms have improved.  Denies weight loss, fever, chills, fatigue, night sweats. Appetite is good Shortness of breath has improved.  Occasionally he coughs.    Review of Systems  Constitutional: Negative for appetite change, chills, fatigue, fever and unexpected weight change.  HENT:   Negative for hearing loss and voice change.   Eyes: Negative for eye problems and icterus.  Respiratory: Negative for chest tightness, cough and shortness of breath.   Cardiovascular: Negative for chest pain and leg swelling.  Gastrointestinal: Negative for abdominal distention and abdominal pain.  Endocrine: Negative for hot flashes.  Genitourinary: Negative for difficulty urinating, dysuria and frequency.   Musculoskeletal: Negative for arthralgias.  Skin: Negative for itching and rash.  Neurological: Negative for light-headedness and numbness.  Hematological: Negative for adenopathy. Does not bruise/bleed easily.  Psychiatric/Behavioral: Negative for confusion.      Allergies  Allergen  Reactions  . Penicillins Rash    Did it  involve swelling of the face/tongue/throat, SOB, or low BP? No Did it involve sudden or severe rash/hives, skin peeling, or any reaction on the inside of your mouth or nose? No Did you need to seek medical attention at a hospital or doctor's office? No When did it last happen?Unknown If all above answers are "NO", may proceed with cephalosporin use.     Past Medical History:  Diagnosis Date  . COVID-19   . Diabetes mellitus without complication ([place])   . Diverticulitis   . Follicular lymphoma of intra-abdominal lymph nodes ([place]) 02/15/2019     Past Surgical History:  Procedure Laterality Date  . CHOLECYSTECTOMY    . COLON RESECTION     DUE TO DIVERTICULITIS  . FLEXIBLE BRONCHOSCOPY N/A 12/02/2019   Procedure: FLEXIBLE BRONCHOSCOPY;  Surgeon: Ottie Glazier, MD;  Location: [facility] ORS;  Service: Thoracic;  Laterality: N/A;  . PORTA CATH INSERTION N/A 03/11/2019   Procedure: PORTA CATH INSERTION;  Surgeon: Algernon Huxley, MD;  Location: [facility];  Service: Cardiovascular;  Laterality: N/A;  . PULMONARY THROMBECTOMY N/A 01/29/2019   Procedure: PULMONARY THROMBECTOMY;  Surgeon: Algernon Huxley, MD;  Location: [facility];  Service: Cardiovascular;  Laterality: N/A;    Social History   Socioeconomic History  . Marital status: Married    Spouse name: Not on file  . Number of children: Not on file  . Years of education: Not on file  . Highest education level: Not on file  Occupational History  . Not on file  Tobacco Use  . Smoking status: Never Smoker  . Smokeless tobacco: Never Used  Vaping Use  . Vaping Use: Never used  Substance and Sexual Activity  . Alcohol use: Yes    Alcohol/week: 0.0 - 2.0 standard drinks    Comment: occasional  . Drug use: Never  . Sexual activity: Yes  Other Topics Concern  . Not on file  Social History Narrative  . Not on file   Social Determinants of Health   Financial Resource  Strain: Not on file  Food Insecurity: Not on file  Transportation Needs: Not on file  Physical Activity: Not on file  Stress: Not on file  Social Connections: Not on file  Intimate Partner Violence: Not on file    Family History  Problem Relation Age of Onset  . Diabetes Brother      Current Outpatient Medications:  .  acyclovir (ZOVIRAX) 400 MG tablet, Take 1 tablet (400 mg total) by mouth 2 (two) times daily., Disp: 180 tablet, Rfl: 1 .  albuterol (PROVENTIL) (2.5 MG/3ML) 0.083% nebulizer solution, Take 2.5 mg by nebulization in the morning, at noon, and at bedtime.  (Patient not taking: Reported on 04/28/2020), Disp: , Rfl:  .  albuterol (VENTOLIN HFA) 108 (90 Base) MCG/ACT inhaler, 2 puffs q.i.d. p.r.n. short of breath, wheezing, or cough (Patient not taking: Reported on 04/28/2020), Disp: , Rfl:  .  BREZTRI AEROSPHERE 160-9-4.8 MCG/ACT AERO, TWO INHALATIONS INTO LUNGS TWICE DAILY, Disp: , Rfl:  .  budesonide (PULMICORT) 1 MG/2ML nebulizer solution, Take 1 mg by nebulization every morning.  (Patient not taking: Reported on 04/28/2020), Disp: , Rfl:  .  Colchicine 0.6 MG CAPS, Take 0.6 mg by mouth at bedtime. , Disp: , Rfl:  .  dextromethorphan-guaiFENesin (MUCINEX DM) 30-600 MG 12hr tablet, Take 1 tablet by mouth 2 (two) times daily as needed for cough. , Disp: , Rfl:  .  ELIQUIS 2.5 MG TABS tablet, TAKE 1 TABLET  BY MOUTH TWICE A DAY, Disp: 60 tablet, Rfl: 3 .  glimepiride (AMARYL) 4 MG tablet, Take 4 mg by mouth daily with breakfast., Disp: , Rfl:  .  lidocaine-prilocaine (EMLA) cream, Apply 1 application topically as needed., Disp: 30 g, Rfl: 1 .  metFORMIN (GLUCOPHAGE) 500 MG tablet, Take 500 mg by mouth 3 (three) times daily with meals. , Disp: , Rfl:  .  montelukast (SINGULAIR) 10 MG tablet, Take 10 mg by mouth at bedtime. , Disp: , Rfl:  .  prochlorperazine (COMPAZINE) 10 MG tablet, Take 1 tablet (10 mg total) by mouth every 6 (six) hours as needed (Nausea or vomiting)., Disp:  30 tablet, Rfl: 1  Physical exam:  Vitals:   07/02/20 0847  BP: 123/86  Pulse: 96  Resp: 18  Temp: 98.6 F (37 C)  SpO2: 98%  Weight: 180 lb 3.2 oz (81.7 kg)   Physical Exam Constitutional:      General: He is not in acute distress. HENT:     Head: Normocephalic and atraumatic.  Eyes:     General: No scleral icterus. Cardiovascular:     Rate and Rhythm: Normal rate and regular rhythm.     Heart sounds: Normal heart sounds.  Pulmonary:     Effort: Pulmonary effort is normal. No respiratory distress.     Breath sounds: Normal breath sounds. No wheezing.  Abdominal:     General: Bowel sounds are normal. There is no distension.     Palpations: Abdomen is soft.  Musculoskeletal:        General: No deformity. Normal range of motion.     Cervical back: Normal range of motion and neck supple.  Skin:    General: Skin is warm and dry.     Findings: No erythema or rash.  Neurological:     Mental Status: He is alert and oriented to person, place, and time. Mental status is at baseline.     Cranial Nerves: No cranial nerve deficit.     Coordination: Coordination normal.  Psychiatric:        Mood and Affect: Mood normal.     CMP Latest Ref Rng & Units 04/28/2020  Glucose 70 - 99 mg/dL 128(H)  BUN 8 - 23 mg/dL 19  Creatinine 0.61 - 1.24 mg/dL 0.77  Sodium 135 - 145 mmol/L 137  Potassium 3.5 - 5.1 mmol/L 4.1  Chloride 98 - 111 mmol/L 101  CO2 22 - 32 mmol/L 25  Calcium 8.9 - 10.3 mg/dL 9.2  Total Protein 6.5 - 8.1 g/dL 7.1  Total Bilirubin 0.3 - 1.2 mg/dL 0.8  Alkaline Phos 38 - 126 U/L 72  AST 15 - 41 U/L 21  ALT 0 - 44 U/L 16   CBC Latest Ref Rng & Units 04/28/2020  WBC 4.0 - 10.5 K/uL 6.3  Hemoglobin 13.0 - 17.0 g/dL 14.5  Hematocrit 39.0 - 52.0 % 42.4  Platelets 150 - 400 K/uL 190   RADIOGRAPHIC STUDIES: I have personally reviewed the radiological images as listed and agreed with the findings in the report. No results found.   Assessment and plan 1.  Follicular lymphoma of intra-abdominal lymph nodes, unspecified follicular lymphoma type ([place])   2. Encounter for monoclonal antibody treatment for malignancy   3. History of pulmonary embolism   4. Interstitial lung disease (H[facility])    Stage III follicular lymphoma Status post 6 cycles of BR, good response. Complete remission Due to his initial bulky disease, currently on rituximab maintenance every 8 weeks for  2 years if he is able to tolerate.  Tolerates well.  Labs are reviewed and discussed with patient. Counts are acceptable to proceed with Rituximab today.  Continue Acyclovir 421m BID for shingle prophylaxis.  Repeat CT chest abdomen pelvis.   #History of PE and DVT Continue Eliquis 2.5 mg twice daily.   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  Improved.  Follow-up in 8 weeks, lab MD rituximab. ZEarlie Server MD, PhD Hematology Oncology [facility] - GainesvillePager- [phone]/06/2020

## 2020-07-17 ENCOUNTER — Ambulatory Visit: Payer: BC Managed Care – PPO

## 2020-07-30 ENCOUNTER — Ambulatory Visit
Admission: RE | Admit: 2020-07-30 | Discharge: 2020-07-30 | Disposition: A | Discharge status: Home / Self Care | Payer: BC Managed Care – PPO | Source: Ambulatory Visit | Attending: Oncology | Admitting: Oncology

## 2020-07-30 ENCOUNTER — Other Ambulatory Visit: Payer: Self-pay

## 2020-07-30 DIAGNOSIS — C8293 Follicular lymphoma, unspecified, intra-abdominal lymph nodes: Secondary | ICD-10-CM | POA: Insufficient documentation

## 2020-07-30 MED ORDER — IOHEXOL 300 MG/ML  SOLN
75.0000 mL | Freq: Once | INTRAMUSCULAR | Status: AC | PRN
  Administered 2020-07-30: 75 mL via INTRAVENOUS

## 2020-08-27 ENCOUNTER — Inpatient Hospital Stay: Payer: BC Managed Care – PPO | Admitting: Oncology

## 2020-08-27 ENCOUNTER — Inpatient Hospital Stay: Payer: BC Managed Care – PPO

## 2020-08-27 ENCOUNTER — Encounter: Payer: Self-pay | Admitting: Oncology

## 2020-08-27 ENCOUNTER — Inpatient Hospital Stay: Payer: BC Managed Care – PPO | Attending: Oncology

## 2020-08-27 VITALS — BP 110/74 | HR 87 | Temp 97.2°F | Resp 16

## 2020-08-27 VITALS — BP 113/78 | HR 98 | Temp 98.9°F | Resp 18 | Wt 177.5 lb

## 2020-08-27 DIAGNOSIS — C8293 Follicular lymphoma, unspecified, intra-abdominal lymph nodes: Secondary | ICD-10-CM

## 2020-08-27 DIAGNOSIS — Z5111 Encounter for antineoplastic chemotherapy: Secondary | ICD-10-CM

## 2020-08-27 DIAGNOSIS — Z8616 Personal history of COVID-19: Secondary | ICD-10-CM | POA: Diagnosis not present

## 2020-08-27 DIAGNOSIS — Z86718 Personal history of other venous thrombosis and embolism: Secondary | ICD-10-CM | POA: Insufficient documentation

## 2020-08-27 DIAGNOSIS — J849 Interstitial pulmonary disease, unspecified: Secondary | ICD-10-CM

## 2020-08-27 DIAGNOSIS — K760 Fatty (change of) liver, not elsewhere classified: Secondary | ICD-10-CM | POA: Diagnosis not present

## 2020-08-27 DIAGNOSIS — Z86711 Personal history of pulmonary embolism: Secondary | ICD-10-CM | POA: Insufficient documentation

## 2020-08-27 DIAGNOSIS — C8280 Other types of follicular lymphoma, unspecified site: Secondary | ICD-10-CM

## 2020-08-27 DIAGNOSIS — Z95828 Presence of other vascular implants and grafts: Secondary | ICD-10-CM

## 2020-08-27 LAB — CBC WITH DIFFERENTIAL/PLATELET
Abs Immature Granulocytes: 0.08 10*3/uL — ABNORMAL HIGH (ref 0.00–0.07)
Basophils Absolute: 0 10*3/uL (ref 0.0–0.1)
Basophils Relative: 1 %
Eosinophils Absolute: 0.1 10*3/uL (ref 0.0–0.5)
Eosinophils Relative: 1 %
HCT: 42.7 % (ref 39.0–52.0)
Hemoglobin: 13.8 g/dL (ref 13.0–17.0)
Immature Granulocytes: 1 %
Lymphocytes Relative: 12 %
Lymphs Abs: 1 10*3/uL (ref 0.7–4.0)
MCH: 28.5 pg (ref 26.0–34.0)
MCHC: 32.3 g/dL (ref 30.0–36.0)
MCV: 88 fL (ref 80.0–100.0)
Monocytes Absolute: 1 10*3/uL (ref 0.1–1.0)
Monocytes Relative: 12 %
Neutro Abs: 6.3 10*3/uL (ref 1.7–7.7)
Neutrophils Relative %: 73 %
Platelets: 300 10*3/uL (ref 150–400)
RBC: 4.85 MIL/uL (ref 4.22–5.81)
RDW: 13.4 % (ref 11.5–15.5)
WBC: 8.4 10*3/uL (ref 4.0–10.5)
nRBC: 0 % (ref 0.0–0.2)

## 2020-08-27 LAB — COMPREHENSIVE METABOLIC PANEL
ALT: 13 U/L (ref 0–44)
AST: 18 U/L (ref 15–41)
Albumin: 3.8 g/dL (ref 3.5–5.0)
Alkaline Phosphatase: 70 U/L (ref 38–126)
Anion gap: 11 (ref 5–15)
BUN: 16 mg/dL (ref 8–23)
CO2: 25 mmol/L (ref 22–32)
Calcium: 9.3 mg/dL (ref 8.9–10.3)
Chloride: 101 mmol/L (ref 98–111)
Creatinine, Ser: 1.04 mg/dL (ref 0.61–1.24)
GFR, Estimated: >60 mL/min (ref >60)
Glucose, Bld: 82 mg/dL (ref 70–99)
Potassium: 4.4 mmol/L (ref 3.5–5.1)
Sodium: 137 mmol/L (ref 135–145)
Total Bilirubin: 0.7 mg/dL (ref 0.3–1.2)
Total Protein: 7.5 g/dL (ref 6.5–8.1)

## 2020-08-27 MED ORDER — DIPHENHYDRAMINE HCL 25 MG PO CAPS
50.0000 mg | ORAL_CAPSULE | Freq: Once | ORAL | Status: AC
  Administered 2020-08-27: 50 mg via ORAL
  Filled 2020-08-27: qty 2

## 2020-08-27 MED ORDER — SODIUM CHLORIDE 0.9% FLUSH
10.0000 mL | Freq: Once | INTRAVENOUS | Status: AC
  Administered 2020-08-27: 10 mL via INTRAVENOUS
  Filled 2020-08-27: qty 10

## 2020-08-27 MED ORDER — ACYCLOVIR 400 MG PO TABS: 400.0000 mg | ORAL_TABLET | Freq: Two times a day (BID) | ORAL | 1 refills | Status: DC

## 2020-08-27 MED ORDER — HEPARIN SOD (PORK) LOCK FLUSH 100 UNIT/ML IV SOLN
500.0000 [IU] | Freq: Once | INTRAVENOUS | Status: AC | PRN
  Administered 2020-08-27: 500 [IU]
  Filled 2020-08-27: qty 5

## 2020-08-27 MED ORDER — SODIUM CHLORIDE 0.9 % IV SOLN
Freq: Once | INTRAVENOUS | Status: AC
  Filled 2020-08-27: qty 250

## 2020-08-27 MED ORDER — ACETAMINOPHEN 325 MG PO TABS
650.0000 mg | ORAL_TABLET | Freq: Once | ORAL | Status: AC
  Administered 2020-08-27: 650 mg via ORAL
  Filled 2020-08-27: qty 2

## 2020-08-27 MED ORDER — HEPARIN SOD (PORK) LOCK FLUSH 100 UNIT/ML IV SOLN
500.0000 [IU] | Freq: Once | INTRAVENOUS | Status: DC
  Filled 2020-08-27: qty 5

## 2020-08-27 MED ORDER — HEPARIN SOD (PORK) LOCK FLUSH 100 UNIT/ML IV SOLN
INTRAVENOUS | Status: AC
  Filled 2020-08-27: qty 5

## 2020-08-27 MED ORDER — SODIUM CHLORIDE 0.9 % IV SOLN
375.0000 mg/m2 | Freq: Once | INTRAVENOUS | Status: AC
  Administered 2020-08-27: 800 mg via INTRAVENOUS
  Filled 2020-08-27: qty 50

## 2020-08-27 MED ORDER — APIXABAN 2.5 MG PO TABS: 2.5000 mg | ORAL_TABLET | Freq: Two times a day (BID) | ORAL | 3 refills | Status: DC

## 2020-08-27 NOTE — Patient Instructions (Signed)
[facility]  Discharge Instructions: Thank you for choosing [facility] to provide your oncology and hematology care.  If you have a lab appointment with the [facility], please go directly to the [facility] and check in at the registration area.  Wear comfortable clothing and clothing appropriate for easy access to any Portacath or PICC line.   We strive to give you quality time with your provider. You may need to reschedule your appointment if you arrive late (15 or more minutes).  Arriving late affects you and other patients whose appointments are after yours.  Also, if you miss three or more appointments without notifying the office, you may be dismissed from the clinic at the provider's discretion.      For prescription refill requests, have your pharmacy contact our office and allow 72 hours for refills to be completed.    Today you received the following chemotherapy and/or immunotherapy agents RuxienceRituximab Injection What is this medicine? RITUXIMAB (ri TUX i mab) is a monoclonal antibody. It is used to treat certain types of cancer like non-Hodgkin lymphoma and chronic lymphocytic leukemia. It is also used to treat rheumatoid arthritis, granulomatosis with polyangiitis, microscopic polyangiitis, and pemphigus vulgaris. This medicine may be used for other purposes; ask your health care provider or pharmacist if you have questions. COMMON BRAND NAME(S): RIABNI, Rituxan, RUXIENCE What should I tell my health care provider before I take this medicine? They need to know if you have any of these conditions:  chest pain  heart disease  infection especially a viral infection such as chickenpox, cold sores, hepatitis B, or herpes  immune system problems  irregular heartbeat or rhythm  kidney disease  low blood counts (white cells, platelets, or red cells)  lung disease  recent or upcoming vaccine  an unusual or allergic  reaction to rituximab, other medicines, foods, dyes, or preservatives  pregnant or trying to get pregnant  breast-feeding How should I use this medicine? This medicine is injected into a vein. It is given by a health care provider in a hospital or clinic setting. A special MedGuide will be given to you before each treatment. Be sure to read this information carefully each time. Talk to your health care provider about the use of this medicine in children. While this drug may be prescribed for children as young as 2 years for selected conditions, precautions do apply. Overdosage: If you think you have taken too much of this medicine contact a poison control center or emergency room at once. NOTE: This medicine is only for you. Do not share this medicine with others. What if I miss a dose? Keep appointments for follow-up doses. It is important not to miss your dose. Call your health care provider if you are unable to keep an appointment. What may interact with this medicine? Do not take this medicine with any of the following medicines:  live vaccines This medicine may also interact with the following medicines:  cisplatin This list may not describe all possible interactions. Give your health care provider a list of all the medicines, herbs, non-prescription drugs, or dietary supplements you use. Also tell them if you smoke, drink alcohol, or use illegal drugs. Some items may interact with your medicine. What should I watch for while using this medicine? Your condition will be monitored carefully while you are receiving this medicine. You may need blood work done while you are taking this medicine. This medicine can cause serious infusion  reactions. To reduce the risk your health care provider may give you other medicines to take before receiving this one. Be sure to follow the directions from your health care provider. This medicine may increase your risk of getting an infection. Call your  health care provider for advice if you get a fever, chills, sore throat, or other symptoms of a cold or flu. Do not treat yourself. Try to avoid being around people who are sick. Call your health care provider if you are around anyone with measles, chickenpox, or if you develop sores or blisters that do not heal properly. Avoid taking medicines that contain aspirin, acetaminophen, ibuprofen, naproxen, or ketoprofen unless instructed by your health care provider. These medicines may hide a fever. This medicine may cause serious skin reactions. They can happen weeks to months after starting the medicine. Contact your health care provider right away if you notice fevers or flu-like symptoms with a rash. The rash may be red or purple and then turn into blisters or peeling of the skin. Or, you might notice a red rash with swelling of the face, lips or lymph nodes in your neck or under your arms. In some patients, this medicine may cause a serious brain infection that may cause death. If you have any problems seeing, thinking, speaking, walking, or standing, tell your healthcare professional right away. If you cannot reach your healthcare professional, urgently seek other source of medical care. Do not become pregnant while taking this medicine or for at least 12 months after stopping it. Women should inform their health care provider if they wish to become pregnant or think they might be pregnant. There is potential for serious harm to an unborn child. Talk to your health care provider for more information. Women should use a reliable form of birth control while taking this medicine and for 12 months after stopping it. Do not breast-feed while taking this medicine or for at least 6 months after stopping it. What side effects may I notice from receiving this medicine? Side effects that you should report to your health care provider as soon as possible:  allergic reactions (skin rash, itching or hives; swelling of  the face, lips, or tongue)  diarrhea  edema (sudden weight gain; swelling of the ankles, feet, hands or other unusual swelling; trouble breathing)  fast, irregular heartbeat  heart attack (trouble breathing; pain or tightness in the chest, neck, back or arms; unusually weak or tired)  infection (fever, chills, cough, sore throat, pain or trouble passing urine)  kidney injury (trouble passing urine or change in the amount of urine)  liver injury (dark yellow or brown urine; general ill feeling or flu-like symptoms; loss of appetite, right upper belly pain; unusually weak or tired, yellowing of the eyes or skin)  low blood pressure (dizziness; feeling faint or lightheaded, falls; unusually weak or tired)  low red blood cell counts (trouble breathing; feeling faint; lightheaded, falls; unusually weak or tired)  mouth sores  redness, blistering, peeling, or loosening of the skin, including inside the mouth  stomach pain  unusual bruising or bleeding  wheezing (trouble breathing with loud or whistling sounds)  vomiting Side effects that usually do not require medical attention (report to your health care provider if they continue or are bothersome):  headache  joint pain  muscle cramps, pain  nausea This list may not describe all possible side effects. Call your doctor for medical advice about side effects. You may report side effects to FDA at  1-800-FDA-1088. Where should I keep my medicine? This medicine is given in a hospital or clinic. It will not be stored at home. NOTE: This sheet is a summary. It may not cover all possible information. If you have questions about this medicine, talk to your doctor, pharmacist, or health care provider.  2021 Elsevier/Gold Standard (2020-01-30 21:35:50)       To help prevent nausea and vomiting after your treatment, we encourage you to take your nausea medication as directed.  BELOW ARE SYMPTOMS THAT SHOULD BE REPORTED  IMMEDIATELY: . *FEVER GREATER THAN 100.4 F (38 C) OR HIGHER . *CHILLS OR SWEATING . *NAUSEA AND VOMITING THAT IS NOT CONTROLLED WITH YOUR NAUSEA MEDICATION . *UNUSUAL SHORTNESS OF BREATH . *UNUSUAL BRUISING OR BLEEDING . *URINARY PROBLEMS (pain or burning when urinating, or frequent urination) . *BOWEL PROBLEMS (unusual diarrhea, constipation, pain near the anus) . TENDERNESS IN MOUTH AND THROAT WITH OR WITHOUT PRESENCE OF ULCERS (sore throat, sores in mouth, or a toothache) . UNUSUAL RASH, SWELLING OR PAIN  . UNUSUAL VAGINAL DISCHARGE OR ITCHING   Items with * indicate a potential emergency and should be followed up as soon as possible or go to the Emergency Department if any problems should occur.  Please show the CHEMOTHERAPY ALERT CARD or IMMUNOTHERAPY ALERT CARD at check-in to the Emergency Department and triage nurse.  Should you have questions after your visit or need to cancel or reschedule your appointment, please contact [facility]  [phone] and follow the prompts.  Office hours are 8:00 a.m. to 4:30 p.m. Monday - Friday. Please note that voicemails left after 4:00 p.m. may not be returned until the following business day.  We are closed weekends and major holidays. You have access to a nurse at all times for urgent questions. Please call the main number to the clinic [phone] and follow the prompts.  For any non-urgent questions, you may also contact your provider using MyChart. We now offer e-Visits for anyone 41 and older to request care online for non-urgent symptoms. For details visit [email or web address].   Also download the MyChart app! Go to the app store, search "MyChart", open the app, select [facility], and log in with your MyChart username and password.  Due to Covid, a mask is required upon entering the hospital/clinic. If you do not have a mask, one will be given to you upon arrival. For doctor visits, patients may have 1  support person aged 32 or older with them. For treatment visits, patients cannot have anyone with them due to current Covid guidelines and our immunocompromised population.

## 2020-08-27 NOTE — Progress Notes (Signed)
Hematology/Oncology Follow Up Note [facility]  Telephone:([phone] [phone]  Patient Care Team: Rusty Aus, MD as PCP - General (Internal Medicine) Earlie Server, MD as Consulting Physician (Hematology and Oncology)   Name of the patient: Edward Atkinson  [id number]  05/11/1960   REASON FOR VISIT  follow-up for management of pulmonary embolism, follicular cell lymphoma.  PERTINENT ONCOLOGY HISTORY # 01/31/2019 Unprovoked PE,  CT chest PE protocol reviewed bilateral massive pulmonary embolism with right heart strain and patient was started on heparin drip.  Patient was tachypneic and tachycardia.  Vascular surgeon was consulted and the patient was taken to the OR for thrombectomy and thrombolysis.  Patient also had selective catheter placement right upper lobe, middle lobe, lower lobe pulmonary arteries, left upper and lower pulmonary arteries. Ultrasound venous duplex bilateral showed DVT of the left lower extremity with nonocclusive thrombus in the left common femoral vein.  Some of the common femoral venous thrombus does extend just into the GSV across SF J.  No evidence of right lower extremity DVT. Patient was started on heparin drip for anticoagulation. Patient is status post embolectomy by vascular surgery. #Additional CT pancreas was done during that admission which showed peripancreatic/retroperitoneal and contiguous small bowel mesentery nodal mass, retroperitoneal adenopathy, gastrohepatic ligament lymphadenopathy, left abdominal omental nodule CT-guided omentum biopsy was done during hospital.  Patient was switched to Lovenox at discharge.  #  PET scan done on 02/11/2019 which showed large conglomerate retroperitoneal and mesenteric mass with maximal SUV 10.6 compatible with Deauville 5 disease.  Deauville 4 adenopathy in the pelvis. A left axillary lymph node measuring 0.7 cm in short axis has a maximum SUV of 3.3. Mild splenomegaly without  focal splenic lesion identified. #02/21/2019, bone marrow biopsy showed no lymphoma involvement.  There might be lymphocyte aggregates that were being cut away on the deeper levels.  Additional IHC did not show residual echograms.  # admitted from 07/29/2019- 08/09/2019 due to COVID pneumonia. Treated with remdesivir and steroids.  He was discharged and still felt some at baseline and was seen by PCP Dr.Elston on 08/09/2019 He was given another course of antibiotics with moxifloxacin 400 mg daily for 10 days, Prednisone tapering and nebulizer.  #08/29/2019 Status post 6 cycles of BR.  Treatment was interrupted during Covid infection March. Community acquired pneumonia in Jan 2022, treated with antibiotics and symptoms have improved.  #_0 weeks INTERVAL HISTORY 60 y.o. male with newly diagnosed pulmonary embolism and follicular lymphoma present for follow-up-evaluation  Patient is on rituximab maintenance every 8 weeks.  Patient tolerates well He was his wife living.  No new complaints.  Patient has some shortness of breath at baseline, improved. Denies weight loss, fever, chills, fatigue, night sweats.  Appetite is fair. He has lost 3 pounds since last visit. Reports bilateral lower extremity cramps after prolonged standing.  Review of Systems  Constitutional: Negative for appetite change, chills, fatigue, fever and unexpected weight change.  HENT:   Negative for hearing loss and voice change.   Eyes: Negative for eye problems and icterus.  Respiratory: Negative for chest tightness, cough and shortness of breath.   Cardiovascular: Negative for chest pain and leg swelling.  Gastrointestinal: Negative for abdominal distention and abdominal pain.  Endocrine: Negative for hot flashes.  Genitourinary: Negative for difficulty urinating, dysuria and frequency.   Musculoskeletal: Negative for arthralgias.  Skin: Negative for itching and rash.  Neurological: Negative for light-headedness and  numbness.  Hematological: Negative for adenopathy.  Does not bruise/bleed easily.  Psychiatric/Behavioral: Negative for confusion.      Allergies  Allergen Reactions  . Penicillins Rash    Did it involve swelling of the face/tongue/throat, SOB, or low BP? No Did it involve sudden or severe rash/hives, skin peeling, or any reaction on the inside of your mouth or nose? No Did you need to seek medical attention at a hospital or doctor's office? No When did it last happen?Unknown If all above answers are "NO", may proceed with cephalosporin use.     Past Medical History:  Diagnosis Date  . COVID-19   . Diabetes mellitus without complication ([place])   . Diverticulitis   . Follicular lymphoma of intra-abdominal lymph nodes ([place]) 02/15/2019     Past Surgical History:  Procedure Laterality Date  . CHOLECYSTECTOMY    . COLON RESECTION     DUE TO DIVERTICULITIS  . FLEXIBLE BRONCHOSCOPY N/A 12/02/2019   Procedure: FLEXIBLE BRONCHOSCOPY;  Surgeon: Ottie Glazier, MD;  Location: [facility] ORS;  Service: Thoracic;  Laterality: N/A;  . PORTA CATH INSERTION N/A 03/11/2019   Procedure: PORTA CATH INSERTION;  Surgeon: Algernon Huxley, MD;  Location: [facility];  Service: Cardiovascular;  Laterality: N/A;  . PULMONARY THROMBECTOMY N/A 01/29/2019   Procedure: PULMONARY THROMBECTOMY;  Surgeon: Algernon Huxley, MD;  Location: [facility];  Service: Cardiovascular;  Laterality: N/A;    Social History   Socioeconomic History  . Marital status: Married    Spouse name: Not on file  . Number of children: Not on file  . Years of education: Not on file  . Highest education level: Not on file  Occupational History  . Not on file  Tobacco Use  . Smoking status: Never Smoker  . Smokeless tobacco: Never Used  Vaping Use  . Vaping Use: Never used  Substance and Sexual Activity  . Alcohol use: Yes    Alcohol/week: 0.0 - 2.0 standard drinks    Comment: occasional  . Drug use: Never  .  Sexual activity: Yes  Other Topics Concern  . Not on file  Social History Narrative  . Not on file   Social Determinants of Health   Financial Resource Strain: Not on file  Food Insecurity: Not on file  Transportation Needs: Not on file  Physical Activity: Not on file  Stress: Not on file  Social Connections: Not on file  Intimate Partner Violence: Not on file    Family History  Problem Relation Age of Onset  . Diabetes Brother      Current Outpatient Medications:  .  acyclovir (ZOVIRAX) 400 MG tablet, Take 1 tablet (400 mg total) by mouth 2 (two) times daily., Disp: 180 tablet, Rfl: 1 .  albuterol (PROVENTIL) (2.5 MG/3ML) 0.083% nebulizer solution, Take 2.5 mg by nebulization in the morning, at noon, and at bedtime., Disp: , Rfl:  .  albuterol (VENTOLIN HFA) 108 (90 Base) MCG/ACT inhaler, , Disp: , Rfl:  .  BREZTRI AEROSPHERE 160-9-4.8 MCG/ACT AERO, TWO INHALATIONS INTO LUNGS TWICE DAILY (Patient not taking: Reported on 07/02/2020), Disp: , Rfl:  .  budesonide (PULMICORT) 1 MG/2ML nebulizer solution, Take 1 mg by nebulization every morning., Disp: , Rfl:  .  Colchicine 0.6 MG CAPS, Take 0.6 mg by mouth at bedtime. , Disp: , Rfl:  .  dextromethorphan-guaiFENesin (MUCINEX DM) 30-600 MG 12hr tablet, Take 1 tablet by mouth 2 (two) times daily as needed for cough. , Disp: , Rfl:  .  ELIQUIS 2.5 MG TABS  tablet, TAKE 1 TABLET BY MOUTH TWICE A DAY, Disp: 60 tablet, Rfl: 3 .  glimepiride (AMARYL) 4 MG tablet, Take 4 mg by mouth daily with breakfast., Disp: , Rfl:  .  lidocaine-prilocaine (EMLA) cream, Apply 1 application topically as needed., Disp: 30 g, Rfl: 1 .  metFORMIN (GLUCOPHAGE) 500 MG tablet, Take 500 mg by mouth 3 (three) times daily with meals. , Disp: , Rfl:  .  montelukast (SINGULAIR) 10 MG tablet, Take 10 mg by mouth at bedtime. , Disp: , Rfl:  .  prochlorperazine (COMPAZINE) 10 MG tablet, Take 1 tablet (10 mg total) by mouth every 6 (six) hours as needed (Nausea or  vomiting)., Disp: 30 tablet, Rfl: 1 No current facility-administered medications for this visit.  Facility-Administered Medications Ordered in Other Visits:  .  heparin lock flush 100 unit/mL, 500 Units, Intravenous, Once, Earlie Server, MD .  sodium chloride flush (NS) 0.9 % injection 10 mL, 10 mL, Intravenous, Once, Earlie Server, MD  Physical exam:  Vitals:   08/27/20 0833  BP: 113/78  Pulse: 98  Resp: 18  Temp: 98.9 F (37.2 C)  Weight: 177 lb 8 oz (80.5 kg)   Physical Exam Constitutional:      General: He is not in acute distress. HENT:     Head: Normocephalic and atraumatic.  Eyes:     General: No scleral icterus. Cardiovascular:     Rate and Rhythm: Normal rate and regular rhythm.     Heart sounds: Normal heart sounds.  Pulmonary:     Effort: Pulmonary effort is normal. No respiratory distress.     Breath sounds: Normal breath sounds. No wheezing.  Abdominal:     General: Bowel sounds are normal. There is no distension.     Palpations: Abdomen is soft.  Musculoskeletal:        General: No deformity. Normal range of motion.     Cervical back: Normal range of motion and neck supple.  Skin:    General: Skin is warm and dry.     Findings: No erythema or rash.  Neurological:     Mental Status: He is alert and oriented to person, place, and time. Mental status is at baseline.     Cranial Nerves: No cranial nerve deficit.     Coordination: Coordination normal.  Psychiatric:        Mood and Affect: Mood normal.     CMP Latest Ref Rng & Units 07/02/2020  Glucose 70 - 99 mg/dL 102(H)  BUN 8 - 23 mg/dL 13  Creatinine 0.61 - 1.24 mg/dL 0.86  Sodium 135 - 145 mmol/L 136  Potassium 3.5 - 5.1 mmol/L 4.0  Chloride 98 - 111 mmol/L 100  CO2 22 - 32 mmol/L 24  Calcium 8.9 - 10.3 mg/dL 9.1  Total Protein 6.5 - 8.1 g/dL 7.2  Total Bilirubin 0.3 - 1.2 mg/dL 0.8  Alkaline Phos 38 - 126 U/L 74  AST 15 - 41 U/L 17  ALT 0 - 44 U/L 13   CBC Latest Ref Rng & Units 07/02/2020  WBC 4.0 -  10.5 K/uL 5.9  Hemoglobin 13.0 - 17.0 g/dL 13.0  Hematocrit 39.0 - 52.0 % 38.6(L)  Platelets 150 - 400 K/uL 253   RADIOGRAPHIC STUDIES: I have personally reviewed the radiological images as listed and agreed with the findings in the report. CT CHEST ABDOMEN PELVIS W CONTRAST  Result Date: 07/31/2020 CLINICAL DATA:  Follicular cell lymphoma follow-up n/a 60 year old male. EXAM: CT CHEST, ABDOMEN, AND PELVIS WITH  CONTRAST TECHNIQUE: Multidetector CT imaging of the chest, abdomen and pelvis was performed following the standard protocol during bolus administration of intravenous contrast. CONTRAST:  64m OMNIPAQUE IOHEXOL 300 MG/ML  SOLN COMPARISON:  No sign of aortic dilation indicating aneurysm. Central pulmonary vasculature is normal caliber. Heart size normal without pericardial effusion. RIGHT-sided Port-A-Cath terminates in the distal superior vena cava. FINDINGS: CT CHEST FINDINGS Cardiovascular: Normal caliber thoracic aorta. Normal heart size. No pericardial effusion. Normal caliber central pulmonary vessels. RIGHT sided Port-A-Cath terminates at the caval to atrial junction. Mediastinum/Nodes: No adenopathy in the chest Lungs/Pleura: Patchy areas of ground-glass throughout the chest and some areas of air trapping that shows slow increase over time from March to July of 2021 but are perhaps slightly improved as compared to the previous PET scan of July of 2021, particularly in the upper lobe on the RIGHT where there is no ground-glass currently previously scattered ground-glass in this location. Persistent mildly nodular appearance of ground-glass in the lower lobes. No effusion. No consolidation. Musculoskeletal: See below for full musculoskeletal details. CT ABDOMEN PELVIS FINDINGS Hepatobiliary: Hepatic steatosis. No focal, suspicious hepatic lesion. Post cholecystectomy without biliary duct dilation. Pancreas: Stranding about the pancreas in the root of the small bowel mesentery is unchanged  compared to previous imaging with respect to stranding immediately adjacent to the pancreas, improved soft tissue, see below in the root of the small bowel mesentery. No ductal dilation or focal lesion in the pancreas. Spleen: Normal size and contour. Measuring approximately 11 cm greatest axial dimension as compared to 12 cm on the study of February 11, 2019, stable appearance as compared to the study of November 07, 2019. Adrenals/Urinary Tract: Adrenal glands are normal. Symmetric renal enhancement. No hydronephrosis. No suspicious renal lesion. Smooth contour of the urinary bladder. Diminished stranding about the LEFT renal hilum compared to previous imaging. Stomach/Bowel: No acute gastrointestinal process. Post partial colonic resection. Appendix not visualized. No pericecal stranding. Vascular/Lymphatic: Patent abdominal aorta and its branches. Patent portal vein and splenic vein. Mild flattening of the IVC with otherwise smooth contour. No discrete adenopathy in the retroperitoneum. Soft tissue in the small bowel mesentery has given way to ground-glass and decreased density overall compared to previous imaging. This measures approximately 4.8 x 2.5 cm as compared to 6.9 x 2.9 cm with respect to the densest area remaining in the root of the small bowel mesentery. No new mesenteric nodularity. No signs of pelvic adenopathy. Reproductive: Prostate unremarkable by CT. Other: No ascites. Musculoskeletal: No acute musculoskeletal process or destructive bone finding. Spinal degenerative changes with pars defects at L5 with grade 2 anterolisthesis of L5 on S1. Similar to prior studies. IMPRESSION: 1. Continued decrease in size and density of the soft tissue in the root of the small bowel mesentery, now measuring 4.8 x 2.5 cm as compared to 6.9 x 2.9 cm. No new mesenteric nodularity. 2. Diminished stranding about the LEFT renal hilum compared to previous imaging. 3. Patchy areas of ground-glass throughout the chest perhaps  slightly improved compared to recent PET scan particularly in the RIGHT upper lobe. There may be mild associated developing bronchiectasis. Findings may represent developing post infectious or inflammatory fibrosis. Correlation with symptoms and follow-up with high-resolution CT of the chest as warranted may be helpful. Would also correlate with any history of drug related pneumonitis that might explain these findings. 4. Hepatic steatosis. 5. Post partial colonic resection. Aortic Atherosclerosis (ICD10-I70.0). Electronically Signed   By: GZetta BillsM.D.   On: 07/31/2020 09:58  Assessment and plan 1. Follicular lymphoma of intra-abdominal lymph nodes, unspecified follicular lymphoma type ([place])   2. History of pulmonary embolism   3. Interstitial lung disease ([place])   4. Encounter for antineoplastic chemotherapy    Stage III follicular lymphoma Status post 6 cycles of BR, good response. Complete remission Due to his initial bulky disease, currently on rituximab maintenance every 8 weeks for 2 years if he is able to tolerate.   Patient tolerates well. Labs are reviewed and discussed with patient Counts acceptable to proceed with the rituximab today 07/30/2020, CT chest abdomen pelvis with contrast showed continued decrease in size and density of the soft tissue in the root of the small bowel mesentery, 4.8 x 2.5 cm, as compared to 6.9 by no new mesenteric nodularity. Diminished stranding left renal hilum.  Areas of groundglass throughout chest is slightly improved. Hepatic steatosis.   Continue Acyclovir 443m BID for shingle prophylaxis.   #History of PE and DVT continue Eliquis 2.5 mg twice daily.   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  Improved.  Follow-up in 8 weeks, lab MD rituximab. ZEarlie Server MD, PhD Hematology Oncology [facility]ager- [phone]/28/2022

## 2020-08-27 NOTE — Progress Notes (Signed)
Patient denies new problems/concerns today.   °

## 2020-09-03 ENCOUNTER — Other Ambulatory Visit: Payer: Self-pay | Admitting: Pulmonary Disease

## 2020-09-03 DIAGNOSIS — J986 Disorders of diaphragm: Secondary | ICD-10-CM

## 2020-09-07 ENCOUNTER — Ambulatory Visit: Payer: BC Managed Care – PPO

## 2020-09-10 ENCOUNTER — Ambulatory Visit
Admission: RE | Admit: 2020-09-10 | Discharge: 2020-09-10 | Disposition: A | Discharge status: Home / Self Care | Payer: BC Managed Care – PPO | Source: Ambulatory Visit | Attending: Pulmonary Disease | Admitting: Pulmonary Disease

## 2020-09-10 ENCOUNTER — Other Ambulatory Visit: Payer: Self-pay

## 2020-09-10 DIAGNOSIS — J986 Disorders of diaphragm: Secondary | ICD-10-CM | POA: Diagnosis not present

## 2020-10-09 ENCOUNTER — Other Ambulatory Visit: Payer: Self-pay | Admitting: Pulmonary Disease

## 2020-10-09 DIAGNOSIS — I2782 Chronic pulmonary embolism: Secondary | ICD-10-CM

## 2020-10-22 ENCOUNTER — Inpatient Hospital Stay: Payer: BC Managed Care – PPO | Attending: Oncology

## 2020-10-22 ENCOUNTER — Inpatient Hospital Stay (HOSPITAL_BASED_OUTPATIENT_CLINIC_OR_DEPARTMENT_OTHER): Payer: BC Managed Care – PPO | Admitting: Oncology

## 2020-10-22 ENCOUNTER — Inpatient Hospital Stay: Payer: BC Managed Care – PPO

## 2020-10-22 ENCOUNTER — Other Ambulatory Visit: Payer: Self-pay | Admitting: Oncology

## 2020-10-22 VITALS — BP 122/81 | HR 84 | Temp 97.2°F

## 2020-10-22 VITALS — BP 133/81 | HR 96 | Temp 98.2°F | Resp 20 | Wt 177.0 lb

## 2020-10-22 DIAGNOSIS — E119 Type 2 diabetes mellitus without complications: Secondary | ICD-10-CM | POA: Diagnosis not present

## 2020-10-22 DIAGNOSIS — Z7984 Long term (current) use of oral hypoglycemic drugs: Secondary | ICD-10-CM | POA: Insufficient documentation

## 2020-10-22 DIAGNOSIS — Z7901 Long term (current) use of anticoagulants: Secondary | ICD-10-CM | POA: Diagnosis not present

## 2020-10-22 DIAGNOSIS — Z9049 Acquired absence of other specified parts of digestive tract: Secondary | ICD-10-CM | POA: Insufficient documentation

## 2020-10-22 DIAGNOSIS — Z5112 Encounter for antineoplastic immunotherapy: Secondary | ICD-10-CM | POA: Insufficient documentation

## 2020-10-22 DIAGNOSIS — C8293 Follicular lymphoma, unspecified, intra-abdominal lymph nodes: Secondary | ICD-10-CM | POA: Diagnosis present

## 2020-10-22 DIAGNOSIS — Z79899 Other long term (current) drug therapy: Secondary | ICD-10-CM | POA: Diagnosis not present

## 2020-10-22 DIAGNOSIS — R161 Splenomegaly, not elsewhere classified: Secondary | ICD-10-CM | POA: Insufficient documentation

## 2020-10-22 DIAGNOSIS — C8280 Other types of follicular lymphoma, unspecified site: Secondary | ICD-10-CM

## 2020-10-22 DIAGNOSIS — J849 Interstitial pulmonary disease, unspecified: Secondary | ICD-10-CM

## 2020-10-22 DIAGNOSIS — Z86711 Personal history of pulmonary embolism: Secondary | ICD-10-CM | POA: Diagnosis not present

## 2020-10-22 DIAGNOSIS — Z8616 Personal history of COVID-19: Secondary | ICD-10-CM | POA: Insufficient documentation

## 2020-10-22 LAB — CBC WITH DIFFERENTIAL/PLATELET
Abs Immature Granulocytes: 0.07 10*3/uL (ref 0.00–0.07)
Basophils Absolute: 0 10*3/uL (ref 0.0–0.1)
Basophils Relative: 0 %
Eosinophils Absolute: 0.1 10*3/uL (ref 0.0–0.5)
Eosinophils Relative: 1 %
HCT: 39.3 % (ref 39.0–52.0)
Hemoglobin: 13.1 g/dL (ref 13.0–17.0)
Immature Granulocytes: 1 %
Lymphocytes Relative: 11 %
Lymphs Abs: 1 10*3/uL (ref 0.7–4.0)
MCH: 27.7 pg (ref 26.0–34.0)
MCHC: 33.3 g/dL (ref 30.0–36.0)
MCV: 83.1 fL (ref 80.0–100.0)
Monocytes Absolute: 1 10*3/uL (ref 0.1–1.0)
Monocytes Relative: 11 %
Neutro Abs: 6.6 10*3/uL (ref 1.7–7.7)
Neutrophils Relative %: 76 %
Platelets: 283 10*3/uL (ref 150–400)
RBC: 4.73 MIL/uL (ref 4.22–5.81)
RDW: 14.7 % (ref 11.5–15.5)
WBC: 8.8 10*3/uL (ref 4.0–10.5)
nRBC: 0 % (ref 0.0–0.2)

## 2020-10-22 LAB — COMPREHENSIVE METABOLIC PANEL
ALT: 12 U/L (ref 0–44)
AST: 18 U/L (ref 15–41)
Albumin: 3.8 g/dL (ref 3.5–5.0)
Alkaline Phosphatase: 63 U/L (ref 38–126)
Anion gap: 10 (ref 5–15)
BUN: 14 mg/dL (ref 8–23)
CO2: 25 mmol/L (ref 22–32)
Calcium: 9.2 mg/dL (ref 8.9–10.3)
Chloride: 98 mmol/L (ref 98–111)
Creatinine, Ser: 0.81 mg/dL (ref 0.61–1.24)
GFR, Estimated: >60 mL/min (ref >60)
Glucose, Bld: 118 mg/dL — ABNORMAL HIGH (ref 70–99)
Potassium: 4 mmol/L (ref 3.5–5.1)
Sodium: 133 mmol/L — ABNORMAL LOW (ref 135–145)
Total Bilirubin: 0.5 mg/dL (ref 0.3–1.2)
Total Protein: 7.3 g/dL (ref 6.5–8.1)

## 2020-10-22 MED ORDER — SODIUM CHLORIDE 0.9% FLUSH
10.0000 mL | INTRAVENOUS | Status: DC | PRN
  Administered 2020-10-22: 10 mL via INTRAVENOUS
  Filled 2020-10-22: qty 10

## 2020-10-22 MED ORDER — DIPHENHYDRAMINE HCL 25 MG PO CAPS
50.0000 mg | ORAL_CAPSULE | Freq: Once | ORAL | Status: AC
Start: 2020-10-22 — End: 2020-10-22
  Administered 2020-10-22: 50 mg via ORAL
  Filled 2020-10-22: qty 2

## 2020-10-22 MED ORDER — SODIUM CHLORIDE 0.9 % IV SOLN
375.0000 mg/m2 | Freq: Once | INTRAVENOUS | Status: AC
  Administered 2020-10-22: 800 mg via INTRAVENOUS
  Filled 2020-10-22: qty 50

## 2020-10-22 MED ORDER — HEPARIN SOD (PORK) LOCK FLUSH 100 UNIT/ML IV SOLN
INTRAVENOUS | Status: AC
  Filled 2020-10-22: qty 5

## 2020-10-22 MED ORDER — SODIUM CHLORIDE 0.9 % IV SOLN
Freq: Once | INTRAVENOUS | Status: AC
  Filled 2020-10-22: qty 250

## 2020-10-22 MED ORDER — HEPARIN SOD (PORK) LOCK FLUSH 100 UNIT/ML IV SOLN
500.0000 [IU] | Freq: Once | INTRAVENOUS | Status: AC
  Administered 2020-10-22: 500 [IU] via INTRAVENOUS
  Filled 2020-10-22: qty 5

## 2020-10-22 MED ORDER — ACETAMINOPHEN 325 MG PO TABS
650.0000 mg | ORAL_TABLET | Freq: Once | ORAL | Status: AC
  Administered 2020-10-22: 650 mg via ORAL
  Filled 2020-10-22: qty 2

## 2020-10-22 NOTE — Patient Instructions (Signed)
[facility] ONCOLOGY  Discharge Instructions: Thank you for choosing [facility] to provide your oncology and hematology care.  If you have a lab appointment with the [facility], please go directly to the [facility] and check in at the registration area.  Wear comfortable clothing and clothing appropriate for easy access to any Portacath or PICC line.   We strive to give you quality time with your provider. You may need to reschedule your appointment if you arrive late (15 or more minutes).  Arriving late affects you and other patients whose appointments are after yours.  Also, if you miss three or more appointments without notifying the office, you may be dismissed from the clinic at the provider's discretion.      For prescription refill requests, have your pharmacy contact our office and allow 72 hours for refills to be completed.    Today you received the following chemotherapy and/or immunotherapy agents : Ruxience   To help prevent nausea and vomiting after your treatment, we encourage you to take your nausea medication as directed.  BELOW ARE SYMPTOMS THAT SHOULD BE REPORTED IMMEDIATELY: *FEVER GREATER THAN 100.4 F (38 C) OR HIGHER *CHILLS OR SWEATING *NAUSEA AND VOMITING THAT IS NOT CONTROLLED WITH YOUR NAUSEA MEDICATION *UNUSUAL SHORTNESS OF BREATH *UNUSUAL BRUISING OR BLEEDING *URINARY PROBLEMS (pain or burning when urinating, or frequent urination) *BOWEL PROBLEMS (unusual diarrhea, constipation, pain near the anus) TENDERNESS IN MOUTH AND THROAT WITH OR WITHOUT PRESENCE OF ULCERS (sore throat, sores in mouth, or a toothache) UNUSUAL RASH, SWELLING OR PAIN  UNUSUAL VAGINAL DISCHARGE OR ITCHING   Items with * indicate a potential emergency and should be followed up as soon as possible or go to the Emergency Department if any problems should occur.  Please show the CHEMOTHERAPY ALERT CARD or IMMUNOTHERAPY ALERT CARD at check-in to  the Emergency Department and triage nurse.  Should you have questions after your visit or need to cancel or reschedule your appointment, please contact [facility]  [phone] and follow the prompts.  Office hours are 8:00 a.m. to 4:30 p.m. Monday - Friday. Please note that voicemails left after 4:00 p.m. may not be returned until the following business day.  We are closed weekends and major holidays. You have access to a nurse at all times for urgent questions. Please call the main number to the clinic [phone] and follow the prompts.  For any non-urgent questions, you may also contact your provider using MyChart. We now offer e-Visits for anyone 62 and older to request care online for non-urgent symptoms. For details visit [email or web address].   Also download the MyChart app! Go to the app store, search "MyChart", open the app, select [facility], and log in with your MyChart username and password.  Due to Covid, a mask is required upon entering the hospital/clinic. If you do not have a mask, one will be given to you upon arrival. For doctor visits, patients may have 1 support person aged 62 or older with them. For treatment visits, patients cannot have anyone with them due to current Covid guidelines and our immunocompromised population.

## 2020-10-22 NOTE — Progress Notes (Signed)
Hematology/Oncology Follow Up Note [facility]  [phone] [phone]  Patient Care Team: Rusty Aus, MD as PCP - General (Internal Medicine) Earlie Server, MD as Consulting Physician (Hematology and Oncology)   Name of the patient: Edward Atkinson  [id number]  [birth year]   REASON FOR VISIT  follow-up for management of pulmonary embolism, follicular cell lymphoma.  PERTINENT ONCOLOGY HISTORY # 01/31/2019 Unprovoked PE,  CT chest PE protocol reviewed bilateral massive pulmonary embolism with right heart strain and patient was started on heparin drip.  Patient was tachypneic and tachycardia.  Vascular surgeon was consulted and the patient was taken to the OR for thrombectomy and thrombolysis.  Patient also had selective catheter placement right upper lobe, middle lobe, lower lobe pulmonary arteries, left upper and lower pulmonary arteries. Ultrasound venous duplex bilateral showed DVT of the left lower extremity with nonocclusive thrombus in the left common femoral vein.  Some of the common femoral venous thrombus does extend just into the GSV across SF J.  No evidence of right lower extremity DVT. Patient was started on heparin drip for anticoagulation. Patient is status post embolectomy by vascular surgery. #Additional CT pancreas was done during that admission which showed peripancreatic/retroperitoneal and contiguous small bowel mesentery nodal mass, retroperitoneal adenopathy, gastrohepatic ligament lymphadenopathy, left abdominal omental nodule CT-guided omentum biopsy was done during hospital.  Patient was switched to Lovenox at discharge.  #  PET scan done on 02/11/2019 which showed large conglomerate retroperitoneal and mesenteric mass with maximal SUV 10.6 compatible with Deauville 5 disease.  Deauville 4 adenopathy in the pelvis. A left axillary lymph node measuring 0.7 cm in short axis has a maximum SUV of 3.3. Mild splenomegaly without  focal splenic lesion identified. #02/21/2019, bone marrow biopsy showed no lymphoma involvement.  There might be lymphocyte aggregates that were being cut away on the deeper levels.  Additional IHC did not show residual echograms.  # admitted from 07/29/2019- 08/09/2019 due to COVID pneumonia. Treated with remdesivir and steroids.  He was discharged and still felt some at baseline and was seen by PCP Dr.Elston on 08/09/2019 He was given another course of antibiotics with moxifloxacin 400 mg daily for 10 days, Prednisone tapering and nebulizer.  #08/29/2019 Status post 6 cycles of BR.  Treatment was interrupted during Covid infection March. Community acquired pneumonia in Jan 2022, treated with antibiotics and symptoms have improved.  #_0 weeks INTERVAL HISTORY 60 y.o. male with newly diagnosed pulmonary embolism and follicular lymphoma present for follow-up-evaluation   He continues rituximab every 8 weeks.  Appears to tolerate well.  He denies any new concerns today.  Has baseline shortness of breath.  Recently had a sniff test to evaluate his diaphragm which did not identify any paralysis of his hemidiaphragm.  He has baseline shortness of breath but it is stable.  Has phlegm first thing in the morning typically clear in color.  Uses albuterol as needed for episodes of shortness of breath.  Overall is doing well.  His weight is stable today.  Review of Systems  Constitutional:  Negative for appetite change, chills, fatigue, fever and unexpected weight change.  HENT:   Negative for hearing loss and voice change.   Eyes:  Negative for eye problems and icterus.  Respiratory:  Positive for shortness of breath. Negative for chest tightness and cough.   Cardiovascular:  Negative for chest pain and leg swelling.  Gastrointestinal:  Negative for abdominal distention and abdominal pain.  Endocrine: Negative for  hot flashes.  Genitourinary:  Negative for difficulty urinating, dysuria and frequency.    Musculoskeletal:  Negative for arthralgias.  Skin:  Negative for itching and rash.  Neurological:  Negative for light-headedness and numbness.  Hematological:  Negative for adenopathy. Does not bruise/bleed easily.  Psychiatric/Behavioral:  Negative for confusion.      Allergies  Allergen Reactions   Penicillins Rash    Did it involve swelling of the face/tongue/throat, SOB, or low BP? No Did it involve sudden or severe rash/hives, skin peeling, or any reaction on the inside of your mouth or nose? No Did you need to seek medical attention at a hospital or doctor's office? No When did it last happen?      Unknown If all above answers are "NO", may proceed with cephalosporin use.     Past Medical History:  Diagnosis Date   COVID-19    Diabetes mellitus without complication ([place])    Diverticulitis    Follicular lymphoma of intra-abdominal lymph nodes ([place]) 02/15/2019     Past Surgical History:  Procedure Laterality Date   CHOLECYSTECTOMY     COLON RESECTION     DUE TO DIVERTICULITIS   FLEXIBLE BRONCHOSCOPY N/A 12/02/2019   Procedure: FLEXIBLE BRONCHOSCOPY;  Surgeon: Ottie Glazier, MD;  Location: [facility] ORS;  Service: Thoracic;  Laterality: N/A;   PORTA CATH INSERTION N/A 03/11/2019   Procedure: PORTA CATH INSERTION;  Surgeon: Algernon Huxley, MD;  Location: [place] [facility];  Service: Cardiovascular;  Laterality: N/A;   PULMONARY THROMBECTOMY N/A 01/29/2019   Procedure: PULMONARY THROMBECTOMY;  Surgeon: Algernon Huxley, MD;  Location: [place] [facility];  Service: Cardiovascular;  Laterality: N/A;    Social History   Socioeconomic History   Marital status: Married    Spouse name: Not on file   Number of children: Not on file   Years of education: Not on file   Highest education level: Not on file  Occupational History   Not on file  Tobacco Use   Smoking status: Never   Smokeless tobacco: Never  Vaping Use   Vaping Use: Never used  Substance and Sexual Activity    Alcohol use: Yes    Alcohol/week: 0.0 - 2.0 standard drinks    Comment: occasional   Drug use: Never   Sexual activity: Yes  Other Topics Concern   Not on file  Social History Narrative   Not on file   Social Determinants of Health   Financial Resource Strain: Not on file  Food Insecurity: Not on file  Transportation Needs: Not on file  Physical Activity: Not on file  Stress: Not on file  Social Connections: Not on file  Intimate Partner Violence: Not on file    Family History  Problem Relation Age of Onset   Diabetes Brother      Current Outpatient Medications:    acyclovir (ZOVIRAX) 400 MG tablet, Take 1 tablet (400 mg total) by mouth 2 (two) times daily., Disp: 180 tablet, Rfl: 1   albuterol (VENTOLIN HFA) 108 (90 Base) MCG/ACT inhaler, , Disp: , Rfl:    apixaban (ELIQUIS) 2.5 MG TABS tablet, Take 1 tablet (2.5 mg total) by mouth 2 (two) times daily., Disp: 60 tablet, Rfl: 3   BREZTRI AEROSPHERE 160-9-4.8 MCG/ACT AERO, TWO INHALATIONS INTO LUNGS TWICE DAILY, Disp: , Rfl:    COMBIVENT RESPIMAT 20-100 MCG/ACT AERS respimat, SMARTSIG:2 Inhalation Via Inhaler 4 Times Daily PRN, Disp: , Rfl:    dextromethorphan-guaiFENesin (MUCINEX DM) 30-600 MG 12hr tablet, Take  1 tablet by mouth 2 (two) times daily as needed for cough. , Disp: , Rfl:    glimepiride (AMARYL) 4 MG tablet, Take 2.5 mg by mouth daily with breakfast., Disp: , Rfl:    lidocaine-prilocaine (EMLA) cream, Apply 1 application topically as needed., Disp: 30 g, Rfl: 1   metFORMIN (GLUCOPHAGE) 500 MG tablet, Take 500 mg by mouth 3 (three) times daily with meals. , Disp: , Rfl:    montelukast (SINGULAIR) 10 MG tablet, Take 10 mg by mouth at bedtime. , Disp: , Rfl:    prochlorperazine (COMPAZINE) 10 MG tablet, Take 1 tablet (10 mg total) by mouth every 6 (six) hours as needed (Nausea or vomiting)., Disp: 30 tablet, Rfl: 1   budesonide (PULMICORT) 1 MG/2ML nebulizer solution, Take 1 mg by nebulization every morning., Disp: ,  Rfl:  No current facility-administered medications for this visit.  Facility-Administered Medications Ordered in Other Visits:    heparin lock flush 100 unit/mL, 500 Units, Intravenous, Once, Earlie Server, MD   heparin lock flush 100 unit/mL, 500 Units, Intravenous, Once, Earlie Server, MD   sodium chloride flush (NS) 0.9 % injection 10 mL, 10 mL, Intravenous, PRN, Earlie Server, MD, 10 mL at 10/22/20 0807  Physical exam:  Vitals:   10/22/20 0839  BP: 133/81  Pulse: 96  Resp: 20  Temp: 98.2 F (36.8 C)  TempSrc: Tympanic  SpO2: 94%  Weight: 177 lb (80.3 kg)   Physical Exam Constitutional:      General: He is not in acute distress. HENT:     Head: Normocephalic and atraumatic.  Eyes:     General: No scleral icterus. Cardiovascular:     Rate and Rhythm: Normal rate and regular rhythm.     Heart sounds: Normal heart sounds.  Pulmonary:     Effort: Pulmonary effort is normal. No respiratory distress.     Breath sounds: Normal breath sounds. No wheezing.  Abdominal:     General: Bowel sounds are normal. There is no distension.     Palpations: Abdomen is soft.  Musculoskeletal:        General: No deformity. Normal range of motion.     Cervical back: Normal range of motion and neck supple.  Skin:    General: Skin is warm and dry.     Findings: No erythema or rash.  Neurological:     Mental Status: He is alert and oriented to person, place, and time. Mental status is at baseline.     Cranial Nerves: No cranial nerve deficit.     Coordination: Coordination normal.  Psychiatric:        Mood and Affect: Mood normal.    CMP Latest Ref Rng & Units 10/22/2020  Glucose 70 - 99 mg/dL 118(H)  BUN 8 - 23 mg/dL 14  Creatinine 0.61 - 1.24 mg/dL 0.81  Sodium 135 - 145 mmol/L 133(L)  Potassium 3.5 - 5.1 mmol/L 4.0  Chloride 98 - 111 mmol/L 98  CO2 22 - 32 mmol/L 25  Calcium 8.9 - 10.3 mg/dL 9.2  Total Protein 6.5 - 8.1 g/dL 7.3  Total Bilirubin 0.3 - 1.2 mg/dL 0.5  Alkaline Phos 38 - 126 U/L  63  AST 15 - 41 U/L 18  ALT 0 - 44 U/L 12   CBC Latest Ref Rng & Units 10/22/2020  WBC 4.0 - 10.5 K/uL 8.8  Hemoglobin 13.0 - 17.0 g/dL 13.1  Hematocrit 39.0 - 52.0 % 39.3  Platelets 150 - 400 K/uL 283   RADIOGRAPHIC STUDIES:  I have personally reviewed the radiological images as listed and agreed with the findings in the report. CT CHEST ABDOMEN PELVIS W CONTRAST  Result Date: 07/31/2020 CLINICAL DATA:  Follicular cell lymphoma follow-up n/a 60 year old male. EXAM: CT CHEST, ABDOMEN, AND PELVIS WITH CONTRAST TECHNIQUE: Multidetector CT imaging of the chest, abdomen and pelvis was performed following the standard protocol during bolus administration of intravenous contrast. CONTRAST:  30m OMNIPAQUE IOHEXOL 300 MG/ML  SOLN COMPARISON:  No sign of aortic dilation indicating aneurysm. Central pulmonary vasculature is normal caliber. Heart size normal without pericardial effusion. RIGHT-sided Port-A-Cath terminates in the distal superior vena cava. FINDINGS: CT CHEST FINDINGS Cardiovascular: Normal caliber thoracic aorta. Normal heart size. No pericardial effusion. Normal caliber central pulmonary vessels. RIGHT sided Port-A-Cath terminates at the caval to atrial junction. Mediastinum/Nodes: No adenopathy in the chest Lungs/Pleura: Patchy areas of ground-glass throughout the chest and some areas of air trapping that shows slow increase over time from March to July of 2021 but are perhaps slightly improved as compared to the previous PET scan of July of 2021, particularly in the upper lobe on the RIGHT where there is no ground-glass currently previously scattered ground-glass in this location. Persistent mildly nodular appearance of ground-glass in the lower lobes. No effusion. No consolidation. Musculoskeletal: See below for full musculoskeletal details. CT ABDOMEN PELVIS FINDINGS Hepatobiliary: Hepatic steatosis. No focal, suspicious hepatic lesion. Post cholecystectomy without biliary duct dilation.  Pancreas: Stranding about the pancreas in the root of the small bowel mesentery is unchanged compared to previous imaging with respect to stranding immediately adjacent to the pancreas, improved soft tissue, see below in the root of the small bowel mesentery. No ductal dilation or focal lesion in the pancreas. Spleen: Normal size and contour. Measuring approximately 11 cm greatest axial dimension as compared to 12 cm on the study of February 11, 2019, stable appearance as compared to the study of November 07, 2019. Adrenals/Urinary Tract: Adrenal glands are normal. Symmetric renal enhancement. No hydronephrosis. No suspicious renal lesion. Smooth contour of the urinary bladder. Diminished stranding about the LEFT renal hilum compared to previous imaging. Stomach/Bowel: No acute gastrointestinal process. Post partial colonic resection. Appendix not visualized. No pericecal stranding. Vascular/Lymphatic: Patent abdominal aorta and its branches. Patent portal vein and splenic vein. Mild flattening of the IVC with otherwise smooth contour. No discrete adenopathy in the retroperitoneum. Soft tissue in the small bowel mesentery has given way to ground-glass and decreased density overall compared to previous imaging. This measures approximately 4.8 x 2.5 cm as compared to 6.9 x 2.9 cm with respect to the densest area remaining in the root of the small bowel mesentery. No new mesenteric nodularity. No signs of pelvic adenopathy. Reproductive: Prostate unremarkable by CT. Other: No ascites. Musculoskeletal: No acute musculoskeletal process or destructive bone finding. Spinal degenerative changes with pars defects at L5 with grade 2 anterolisthesis of L5 on S1. Similar to prior studies. IMPRESSION: 1. Continued decrease in size and density of the soft tissue in the root of the small bowel mesentery, now measuring 4.8 x 2.5 cm as compared to 6.9 x 2.9 cm. No new mesenteric nodularity. 2. Diminished stranding about the LEFT renal  hilum compared to previous imaging. 3. Patchy areas of ground-glass throughout the chest perhaps slightly improved compared to recent PET scan particularly in the RIGHT upper lobe. There may be mild associated developing bronchiectasis. Findings may represent developing post infectious or inflammatory fibrosis. Correlation with symptoms and follow-up with high-resolution CT of the chest as warranted  may be helpful. Would also correlate with any history of drug related pneumonitis that might explain these findings. 4. Hepatic steatosis. 5. Post partial colonic resection. Aortic Atherosclerosis (ICD10-I70.0). Electronically Signed   By: Zetta Bills M.D.   On: 07/31/2020 09:58   DG Sniff Test  Result Date: 09/10/2020 CLINICAL DATA:  Elevated hemidiaphragm. EXAM: CHEST FLUOROSCOPY TECHNIQUE: Real-time fluoroscopic evaluation of the chest was performed. FLUOROSCOPY TIME:  Fluoroscopy Time:  1 minutes and 30 seconds. Radiation Exposure Index (if provided by the fluoroscopic device): 11.5 mGy Number of Acquired Spot Images: COMPARISON:  None. FINDINGS: Mild asymmetric elevation right hemidiaphragm noted, not a substantial as on the x-ray from 07/29/2019. No evidence for paradoxical excursion with inspiration. IMPRESSION: No evidence for paralysis of either hemidiaphragm. Electronically Signed   By: Misty Stanley M.D.   On: 09/10/2020 10:47      Assessment and plan No diagnosis found.  Stage III follicular lymphoma Status post 6 cycles of BR, good response.  Complete remission Due to his initial bulky disease, currently on rituximab maintenance every 8 weeks for 2 years if he is able to tolerate.   Patient tolerates well. Most recent imaging from 07/30/2020 shows continued disease Labs are reviewed and discussed with patient Counts acceptable to proceed with the rituximab today Imaging from 07/30/2020 she is continued decrease in size and density of the soft tissue in the root of the small bowel  mesentery. He continues acyclovir 40 mg twice daily for shingles prophylaxis.  History of PE and DVT continue Eliquis 2.5 mg twice daily.   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 Continues to have shortness of breath especially with exertion but feels this is improving. Uses albuterol inhaler as needed Recently had a sniff test to evaluate his diaphragm which did not show any evidence of paralysis. He has a follow-up CT angio scheduled for 10/26/2020.  Disposition: Return to clinic in 8 weeks with lab work, MD assessment and rituximab.  Greater than 50% was spent in counseling and coordination of care with this patient including but not limited to discussion of the relevant topics above (See A&P) including, but not limited to diagnosis and management of acute and chronic medical conditions.   Faythe Casa, NP 10/22/2020 1:01 PM

## 2020-10-26 ENCOUNTER — Ambulatory Visit
Admission: RE | Admit: 2020-10-26 | Discharge: 2020-10-26 | Disposition: A | Discharge status: Home / Self Care | Payer: BC Managed Care – PPO | Source: Ambulatory Visit | Attending: Pulmonary Disease | Admitting: Pulmonary Disease

## 2020-10-26 ENCOUNTER — Other Ambulatory Visit: Payer: Self-pay

## 2020-10-26 DIAGNOSIS — I2782 Chronic pulmonary embolism: Secondary | ICD-10-CM | POA: Diagnosis not present

## 2020-10-26 MED ORDER — IOHEXOL 350 MG/ML SOLN
75.0000 mL | Freq: Once | INTRAVENOUS | Status: AC | PRN
  Administered 2020-10-26: 75 mL via INTRAVENOUS

## 2020-12-17 ENCOUNTER — Ambulatory Visit: Payer: BC Managed Care – PPO

## 2020-12-17 ENCOUNTER — Other Ambulatory Visit: Payer: BC Managed Care – PPO

## 2020-12-17 ENCOUNTER — Ambulatory Visit: Payer: BC Managed Care – PPO | Admitting: Oncology

## 2020-12-18 ENCOUNTER — Inpatient Hospital Stay: Payer: BC Managed Care – PPO

## 2020-12-18 ENCOUNTER — Inpatient Hospital Stay: Payer: BC Managed Care – PPO | Admitting: Oncology

## 2020-12-24 ENCOUNTER — Telehealth: Payer: Self-pay | Admitting: Oncology

## 2020-12-24 NOTE — Telephone Encounter (Signed)
This is the second time pt r/s due to not feeling well. Can you reach out to pt and see if he is willing to be evaluated by [facility], he may need some IVF.

## 2020-12-24 NOTE — Telephone Encounter (Signed)
Patient's spouse called and stated that patient is not feeling up for chemo treatment on 8/26. She requested that we push it back by "at least 2 weeks". We moved treatment out until 9/9.   Routing to clinical team to make aware.

## 2020-12-24 NOTE — Telephone Encounter (Signed)
Pt was contacted to f/u with concern of "not feeling well". Pt sated that he recently had pneumonia and is still having some "breathing issues". He wants to postpone his treatment until he is recovered from this. Pt was offered appointment for an evaluation in symptom management clinic; however, he declined and stated that he was already following his PCP and his pulmonologist for this. He was encouraged to call us back for any further issues or concerns.

## 2020-12-25 ENCOUNTER — Inpatient Hospital Stay: Payer: BC Managed Care – PPO

## 2020-12-25 ENCOUNTER — Inpatient Hospital Stay: Payer: BC Managed Care – PPO | Admitting: Oncology

## 2021-01-01 ENCOUNTER — Other Ambulatory Visit: Payer: Self-pay

## 2021-01-01 ENCOUNTER — Encounter: Payer: BC Managed Care – PPO | Attending: Pulmonary Disease

## 2021-01-01 DIAGNOSIS — R0600 Dyspnea, unspecified: Secondary | ICD-10-CM

## 2021-01-01 DIAGNOSIS — J849 Interstitial pulmonary disease, unspecified: Secondary | ICD-10-CM

## 2021-01-01 DIAGNOSIS — U099 Post covid-19 condition, unspecified: Secondary | ICD-10-CM

## 2021-01-01 NOTE — Progress Notes (Signed)
Virtual Visit completed. Patient informed on EP and RD appointment and 6 Minute walk test. Patient also informed of patient health questionnaires on My Chart. Patient Verbalizes understanding. Visit diagnosis can be found in [facility] 12/16/2020.

## 2021-01-03 ENCOUNTER — Other Ambulatory Visit: Payer: Self-pay | Admitting: Oncology

## 2021-01-04 ENCOUNTER — Encounter: Payer: Self-pay | Admitting: Oncology

## 2021-01-08 ENCOUNTER — Inpatient Hospital Stay: Payer: BC Managed Care – PPO | Attending: Oncology

## 2021-01-08 ENCOUNTER — Encounter: Payer: Self-pay | Admitting: Oncology

## 2021-01-08 ENCOUNTER — Inpatient Hospital Stay: Payer: BC Managed Care – PPO

## 2021-01-08 ENCOUNTER — Inpatient Hospital Stay: Payer: BC Managed Care – PPO | Admitting: Oncology

## 2021-01-08 VITALS — BP 122/72 | HR 115 | Temp 98.4°F | Wt 152.3 lb

## 2021-01-08 DIAGNOSIS — E119 Type 2 diabetes mellitus without complications: Secondary | ICD-10-CM | POA: Insufficient documentation

## 2021-01-08 DIAGNOSIS — C8293 Follicular lymphoma, unspecified, intra-abdominal lymph nodes: Secondary | ICD-10-CM | POA: Insufficient documentation

## 2021-01-08 DIAGNOSIS — Z79899 Other long term (current) drug therapy: Secondary | ICD-10-CM | POA: Diagnosis not present

## 2021-01-08 DIAGNOSIS — Z86711 Personal history of pulmonary embolism: Secondary | ICD-10-CM | POA: Diagnosis not present

## 2021-01-08 DIAGNOSIS — Z7951 Long term (current) use of inhaled steroids: Secondary | ICD-10-CM | POA: Insufficient documentation

## 2021-01-08 DIAGNOSIS — Z86718 Personal history of other venous thrombosis and embolism: Secondary | ICD-10-CM | POA: Insufficient documentation

## 2021-01-08 DIAGNOSIS — J849 Interstitial pulmonary disease, unspecified: Secondary | ICD-10-CM | POA: Diagnosis not present

## 2021-01-08 DIAGNOSIS — Z7984 Long term (current) use of oral hypoglycemic drugs: Secondary | ICD-10-CM | POA: Diagnosis not present

## 2021-01-08 DIAGNOSIS — R161 Splenomegaly, not elsewhere classified: Secondary | ICD-10-CM | POA: Insufficient documentation

## 2021-01-08 DIAGNOSIS — Z8616 Personal history of COVID-19: Secondary | ICD-10-CM | POA: Insufficient documentation

## 2021-01-08 DIAGNOSIS — Z95828 Presence of other vascular implants and grafts: Secondary | ICD-10-CM

## 2021-01-08 DIAGNOSIS — Z5112 Encounter for antineoplastic immunotherapy: Secondary | ICD-10-CM | POA: Diagnosis not present

## 2021-01-08 DIAGNOSIS — Z7901 Long term (current) use of anticoagulants: Secondary | ICD-10-CM | POA: Insufficient documentation

## 2021-01-08 LAB — CBC WITH DIFFERENTIAL/PLATELET
Abs Immature Granulocytes: 0.24 10*3/uL — ABNORMAL HIGH (ref 0.00–0.07)
Basophils Absolute: 0 10*3/uL (ref 0.0–0.1)
Basophils Relative: 0 %
Eosinophils Absolute: 0.1 10*3/uL (ref 0.0–0.5)
Eosinophils Relative: 1 %
HCT: 38.6 % — ABNORMAL LOW (ref 39.0–52.0)
Hemoglobin: 12.4 g/dL — ABNORMAL LOW (ref 13.0–17.0)
Immature Granulocytes: 3 %
Lymphocytes Relative: 9 %
Lymphs Abs: 0.6 10*3/uL — ABNORMAL LOW (ref 0.7–4.0)
MCH: 26.9 pg (ref 26.0–34.0)
MCHC: 32.1 g/dL (ref 30.0–36.0)
MCV: 83.7 fL (ref 80.0–100.0)
Monocytes Absolute: 0.5 10*3/uL (ref 0.1–1.0)
Monocytes Relative: 7 %
Neutro Abs: 5.8 10*3/uL (ref 1.7–7.7)
Neutrophils Relative %: 80 %
Platelets: 254 10*3/uL (ref 150–400)
RBC: 4.61 MIL/uL (ref 4.22–5.81)
RDW: 17.5 % — ABNORMAL HIGH (ref 11.5–15.5)
WBC: 7.3 10*3/uL (ref 4.0–10.5)
nRBC: 0 % (ref 0.0–0.2)

## 2021-01-08 LAB — COMPREHENSIVE METABOLIC PANEL
ALT: 23 U/L (ref 0–44)
AST: 23 U/L (ref 15–41)
Albumin: 3.3 g/dL — ABNORMAL LOW (ref 3.5–5.0)
Alkaline Phosphatase: 75 U/L (ref 38–126)
Anion gap: 8 (ref 5–15)
BUN: 17 mg/dL (ref 8–23)
CO2: 28 mmol/L (ref 22–32)
Calcium: 9 mg/dL (ref 8.9–10.3)
Chloride: 97 mmol/L — ABNORMAL LOW (ref 98–111)
Creatinine, Ser: 0.59 mg/dL — ABNORMAL LOW (ref 0.61–1.24)
GFR, Estimated: >60 mL/min (ref >60)
Glucose, Bld: 123 mg/dL — ABNORMAL HIGH (ref 70–99)
Potassium: 4.1 mmol/L (ref 3.5–5.1)
Sodium: 133 mmol/L — ABNORMAL LOW (ref 135–145)
Total Bilirubin: 0.7 mg/dL (ref 0.3–1.2)
Total Protein: 7 g/dL (ref 6.5–8.1)

## 2021-01-08 MED ORDER — HEPARIN SOD (PORK) LOCK FLUSH 100 UNIT/ML IV SOLN
500.0000 [IU] | Freq: Once | INTRAVENOUS | Status: AC
  Administered 2021-01-08: 500 [IU] via INTRAVENOUS
  Filled 2021-01-08: qty 5

## 2021-01-08 MED ORDER — SODIUM CHLORIDE 0.9% FLUSH
10.0000 mL | Freq: Once | INTRAVENOUS | Status: AC
  Administered 2021-01-08: 10 mL via INTRAVENOUS
  Filled 2021-01-08: qty 10

## 2021-01-08 MED ORDER — HEPARIN SOD (PORK) LOCK FLUSH 100 UNIT/ML IV SOLN
500.0000 [IU] | Freq: Once | INTRAVENOUS | Status: DC
  Filled 2021-01-08: qty 5

## 2021-01-08 NOTE — Progress Notes (Signed)
Patient here for follow up. Pt reports he had pneumonia and "lungs haven't cleard up very well." Pt is currently on 2L of continuous O2. Pt has been having some congestion, but just completed a course of antibiotics. Pt's oxygen level is low 80s, pt reports it drops when he is sitting up. He usually stays inclined at home. Pt reports no increased shortness of breath. Oxygen bumped to 4L, pt tolerating well

## 2021-01-08 NOTE — Progress Notes (Signed)
Hematology/Oncology Follow Up Note [facility]  [phone] [phone]  Patient Care Team: Rusty Aus, MD as PCP - General (Internal Medicine) Earlie Server, MD as Consulting Physician (Hematology and Oncology)   Name of the patient: Edward Atkinson  [id number]  05-25-1960   REASON FOR VISIT  follow-up for management of pulmonary embolism, follicular cell lymphoma.  PERTINENT ONCOLOGY HISTORY # 01/31/2019 Unprovoked PE,  CT chest PE protocol reviewed bilateral massive pulmonary embolism with right heart strain and patient was started on heparin drip.  Patient was tachypneic and tachycardia.  Vascular surgeon was consulted and the patient was taken to the OR for thrombectomy and thrombolysis.  Patient also had selective catheter placement right upper lobe, middle lobe, lower lobe pulmonary arteries, left upper and lower pulmonary arteries. Ultrasound venous duplex bilateral showed DVT of the left lower extremity with nonocclusive thrombus in the left common femoral vein.  Some of the common femoral venous thrombus does extend just into the GSV across SF J.  No evidence of right lower extremity DVT. Patient was started on heparin drip for anticoagulation. Patient is status post embolectomy by vascular surgery. #Additional CT pancreas was done during that admission which showed peripancreatic/retroperitoneal and contiguous small bowel mesentery nodal mass, retroperitoneal adenopathy, gastrohepatic ligament lymphadenopathy, left abdominal omental nodule CT-guided omentum biopsy was done during hospital.  Patient was switched to Lovenox at discharge.  #  PET scan done on 02/11/2019 which showed large conglomerate retroperitoneal and mesenteric mass with maximal SUV 10.6 compatible with Deauville 5 disease.  Deauville 4 adenopathy in the pelvis. A left axillary lymph node measuring 0.7 cm in short axis has a maximum SUV of 3.3. Mild splenomegaly without  focal splenic lesion identified. #02/21/2019, bone marrow biopsy showed no lymphoma involvement.  There might be lymphocyte aggregates that were being cut away on the deeper levels.  Additional IHC did not show residual echograms.  # admitted from 07/29/2019- 08/09/2019 due to COVID pneumonia. Treated with remdesivir and steroids.  He was discharged and still felt some at baseline and was seen by PCP Dr.Elston on 08/09/2019 He was given another course of antibiotics with moxifloxacin 400 mg daily for 10 days, Prednisone tapering and nebulizer.  #08/29/2019 Status post 6 cycles of BR.  Treatment was interrupted during Covid infection March. Community acquired pneumonia in Jan 2022, treated with antibiotics and symptoms have improved.  # 01/07/2020- 10/22/2020 Rituximab maintenance.   INTERVAL HISTORY 60 y.o. male with newly diagnosed pulmonary embolism and follicular lymphoma present for follow-up-evaluation  Patient is on rituximab maintenance every 8 weeks.  He was accompanied by his wife.  Patient's breathing status has worsened since last visit, more dyspnea, now on 2 L of nasal cannula oxygen.  Today in the clinic, after ambulating, he feels more shortness of breath and desaturate to 82% on 4L.  He has lost 15 pounds since last visit. No fever or chills.   Review of Systems  Constitutional:  Positive for unexpected weight change. Negative for appetite change, chills, fatigue and fever.  HENT:   Negative for hearing loss and voice change.   Eyes:  Negative for eye problems and icterus.  Respiratory:  Positive for shortness of breath. Negative for chest tightness and cough.   Cardiovascular:  Negative for chest pain and leg swelling.  Gastrointestinal:  Negative for abdominal distention and abdominal pain.  Endocrine: Negative for hot flashes.  Genitourinary:  Negative for difficulty urinating, dysuria and frequency.   Musculoskeletal:  Negative  for arthralgias.  Skin:  Negative for itching and  rash.  Neurological:  Negative for light-headedness and numbness.  Hematological:  Negative for adenopathy. Does not bruise/bleed easily.  Psychiatric/Behavioral:  Negative for confusion.      Allergies  Allergen Reactions   Penicillins Rash    Did it involve swelling of the face/tongue/throat, SOB, or low BP? No Did it involve sudden or severe rash/hives, skin peeling, or any reaction on the inside of your mouth or nose? No Did you need to seek medical attention at a hospital or doctor's office? No When did it last happen?      Unknown If all above answers are "NO", may proceed with cephalosporin use.     Past Medical History:  Diagnosis Date   COVID-19    Diabetes mellitus without complication ([place])    Diverticulitis    Follicular lymphoma of intra-abdominal lymph nodes ([place]) 02/15/2019     Past Surgical History:  Procedure Laterality Date   CHOLECYSTECTOMY     COLON RESECTION     DUE TO DIVERTICULITIS   FLEXIBLE BRONCHOSCOPY N/A 12/02/2019   Procedure: FLEXIBLE BRONCHOSCOPY;  Surgeon: Ottie Glazier, MD;  Location: [facility];  Service: Thoracic;  Laterality: N/A;   PORTA CATH INSERTION N/A 03/11/2019   Procedure: PORTA CATH INSERTION;  Surgeon: Algernon Huxley, MD;  Location: [place] [facility];  Service: Cardiovascular;  Laterality: N/A;   PULMONARY THROMBECTOMY N/A 01/29/2019   Procedure: PULMONARY THROMBECTOMY;  Surgeon: Algernon Huxley, MD;  Location: [place] [facility];  Service: Cardiovascular;  Laterality: N/A;    Social History   Socioeconomic History   Marital status: Married    Spouse name: Not on file   Number of children: Not on file   Years of education: Not on file   Highest education level: Not on file  Occupational History   Not on file  Tobacco Use   Smoking status: Never   Smokeless tobacco: Never  Vaping Use   Vaping Use: Never used  Substance and Sexual Activity   Alcohol use: Yes    Alcohol/week: 0.0 - 2.0 standard drinks    Comment:  occasional   Drug use: Never   Sexual activity: Yes  Other Topics Concern   Not on file  Social History Narrative   Not on file   Social Determinants of Health   Financial Resource Strain: Not on file  Food Insecurity: Not on file  Transportation Needs: Not on file  Physical Activity: Not on file  Stress: Not on file  Social Connections: Not on file  Intimate Partner Violence: Not on file    Family History  Problem Relation Age of Onset   Diabetes Brother      Current Outpatient Medications:    acyclovir (ZOVIRAX) 400 MG tablet, Take 1 tablet (400 mg total) by mouth 2 (two) times daily., Disp: 180 tablet, Rfl: 1   albuterol (PROVENTIL) (2.5 MG/3ML) 0.083% nebulizer solution, Inhale into the lungs., Disp: , Rfl:    albuterol (VENTOLIN HFA) 108 (90 Base) MCG/ACT inhaler, , Disp: , Rfl:    BREZTRI AEROSPHERE 160-9-4.8 MCG/ACT AERO, TWO INHALATIONS INTO LUNGS TWICE DAILY, Disp: , Rfl:    budesonide (PULMICORT) 1 MG/2ML nebulizer solution, Inhale into the lungs., Disp: , Rfl:    COMBIVENT RESPIMAT 20-100 MCG/ACT AERS respimat, , Disp: , Rfl:    dextromethorphan-guaiFENesin (MUCINEX DM) 30-600 MG 12hr tablet, Take 1 tablet by mouth 2 (two) times daily as needed for cough. , Disp: , Rfl:  ELIQUIS 2.5 MG TABS tablet, TAKE 1 TABLET BY MOUTH TWICE A DAY, Disp: 60 tablet, Rfl: 3   glimepiride (AMARYL) 2 MG tablet, Take 2 mg by mouth every morning., Disp: , Rfl:    lidocaine-prilocaine (EMLA) cream, Apply 1 application topically as needed., Disp: 30 g, Rfl: 1   metFORMIN (GLUCOPHAGE) 500 MG tablet, Take 500 mg by mouth 3 (three) times daily with meals. , Disp: , Rfl:    montelukast (SINGULAIR) 10 MG tablet, Take 10 mg by mouth at bedtime. , Disp: , Rfl:    prochlorperazine (COMPAZINE) 10 MG tablet, Take 1 tablet (10 mg total) by mouth every 6 (six) hours as needed (Nausea or vomiting)., Disp: 30 tablet, Rfl: 1   budesonide (PULMICORT) 1 MG/2ML nebulizer solution, Take 1 mg by  nebulization every morning., Disp: , Rfl:    glimepiride (AMARYL) 4 MG tablet, Take 2.5 mg by mouth daily with breakfast. (Patient not taking: No sig reported), Disp: , Rfl:  No current facility-administered medications for this visit.  Facility-Administered Medications Ordered in Other Visits:    heparin lock flush 100 unit/mL, 500 Units, Intravenous, Once, Earlie Server, MD  Physical exam:  Vitals:   01/08/21 1026  BP: 122/72  Pulse: (!) 115  Temp: 98.4 F (36.9 C)  SpO2: (!) 82%  Weight: 152 lb 4.8 oz (69.1 kg)  PF: (!) 4 L/min   Physical Exam Constitutional:      General: He is not in acute distress. HENT:     Head: Normocephalic and atraumatic.  Eyes:     General: No scleral icterus. Cardiovascular:     Rate and Rhythm: Normal rate and regular rhythm.     Heart sounds: Normal heart sounds.  Pulmonary:     Effort: Pulmonary effort is normal. No respiratory distress.     Comments: Mild respiratory distress Abdominal:     General: Bowel sounds are normal. There is no distension.     Palpations: Abdomen is soft.  Musculoskeletal:        General: No deformity. Normal range of motion.     Cervical back: Normal range of motion and neck supple.  Skin:    General: Skin is warm and dry.     Findings: No erythema or rash.  Neurological:     Mental Status: He is alert and oriented to person, place, and time. Mental status is at baseline.     Cranial Nerves: No cranial nerve deficit.     Coordination: Coordination normal.  Psychiatric:        Mood and Affect: Mood normal.    CMP Latest Ref Rng & Units 01/08/2021  Glucose 70 - 99 mg/dL 123(H)  BUN 8 - 23 mg/dL 17  Creatinine 0.61 - 1.24 mg/dL 0.59(L)  Sodium 135 - 145 mmol/L 133(L)  Potassium 3.5 - 5.1 mmol/L 4.1  Chloride 98 - 111 mmol/L 97(L)  CO2 22 - 32 mmol/L 28  Calcium 8.9 - 10.3 mg/dL 9.0  Total Protein 6.5 - 8.1 g/dL 7.0  Total Bilirubin 0.3 - 1.2 mg/dL 0.7  Alkaline Phos 38 - 126 U/L 75  AST 15 - 41 U/L 23  ALT  0 - 44 U/L 23   CBC Latest Ref Rng & Units 01/08/2021  WBC 4.0 - 10.5 K/uL 7.3  Hemoglobin 13.0 - 17.0 g/dL 12.4(L)  Hematocrit 39.0 - 52.0 % 38.6(L)  Platelets 150 - 400 K/uL 254   RADIOGRAPHIC STUDIES: I have personally reviewed the radiological images as listed and agreed with the  findings in the report. CT Angio Chest Pulmonary Embolism (PE) W or WO Contrast  Result Date: 10/26/2020 CLINICAL DATA:  Shortness of breath. History of prior pulmonary embolus EXAM: CT ANGIOGRAPHY CHEST WITH CONTRAST TECHNIQUE: Multidetector CT imaging of the chest was performed using the standard protocol during bolus administration of intravenous contrast. Multiplanar CT image reconstructions and MIPs were obtained to evaluate the vascular anatomy. CONTRAST:  60m OMNIPAQUE IOHEXOL 350 MG/ML SOLN COMPARISON:  Chest CT July 30, 2020 FINDINGS: Cardiovascular: There is no appreciable pulmonary embolus. A small lucency in a right upper lobe pulmonary vessel represents what is suspected to likely be flow phenomenon in a peripheral pulmonary vein. No similar pulmonary arterial lucencies evident on this study. There is no evident thoracic aortic aneurysm or dissection. No appreciable pericardial effusion or pericardial thickening. Mediastinum/Nodes: Visualized thyroid appears unremarkable. No evident thoracic adenopathy by size criteria. No esophageal lesions are appreciable. Lungs/Pleura: There is airspace opacity in the posterior segment of the left upper lobe which was not present on prior study. There is airspace opacity throughout much of the left lower lobe and to a lesser degree in portions of the right lower lobe. No appreciable pleural effusions. Trachea and major bronchial structures appear patent. No evident pneumothorax. Upper Abdomen: Gallbladder absent. Visualized upper abdominal structures otherwise unremarkable. Musculoskeletal: No blastic or lytic bone lesions. No evident chest wall lesions. Review of the MIP  images confirms the above findings. IMPRESSION: 1. No appreciable pulmonary embolus. No thoracic aortic aneurysm or dissection. 2. Multifocal airspace opacity, most severe in the posterior segment of the left lower lobe inferiorly and throughout much of the left lower lobe. Note that there was opacity throughout much of the left lower lobe and to a lesser extent right lower lobe on prior CT. Question residua with possible reinfection from atypical organism pneumonia. Check of COVID-19 status advised. A bacterial pneumonia could also present in this manner on the left. No pleural effusions evident. 3.  No appreciable adenopathy. 4.  Absent gallbladder. These results will be called to the ordering clinician or representative by the Radiologist Assistant, and communication documented in the PACS or [employer] Electronically Signed   By: WLowella GripIII M.D.   On: 10/26/2020 09:37      Assessment and plan 1. Encounter for monoclonal antibody treatment for malignancy   2. History of pulmonary embolism   3. History of DVT (deep vein thrombosis)   4. Follicular lymphoma of intra-abdominal lymph nodes, unspecified follicular lymphoma type (HCC)    Stage III follicular lymphoma Status post 6 cycles of BR, good response. Complete remission Patient has been on rituximab maintenance every 8 weeks.  I will hold additional treatment at this point due to his worsened respiratory status.  Continue Acyclovir 4034mBID for shingle prophylaxis.  CT chest abdomen pelvis in Sept 2022 for surveillance.   # Post COVID 19 interstitial lung disease.  Follow up with Dr.Aleskerov.  #History of PE and DVT Continue Eliquis 2.5 mg twice daily.     Follow-up in 6 months. , lab MD ZhEarlie ServerMD, PhD Hematology Oncology [facility]/12/2020

## 2021-01-09 ENCOUNTER — Encounter: Payer: Self-pay | Admitting: Oncology

## 2021-01-12 ENCOUNTER — Telehealth: Payer: Self-pay

## 2021-01-12 DIAGNOSIS — C8293 Follicular lymphoma, unspecified, intra-abdominal lymph nodes: Secondary | ICD-10-CM

## 2021-01-12 NOTE — Telephone Encounter (Signed)
Pt notified that he need CT via Mychart.  Please schedule patient for CT in mid/ late Oct and notify pt of appt.

## 2021-01-12 NOTE — Telephone Encounter (Signed)
-----   Message from Earlie Server, MD sent at 01/12/2021  8:41 AM EDT ----- Please schedule him to get CT chest abdomen pelvis w contrast in Mid/late Oct. - follicular lymphoma follow up

## 2021-01-13 ENCOUNTER — Encounter: Payer: Self-pay | Admitting: Radiology

## 2021-01-13 ENCOUNTER — Emergency Department: Payer: BC Managed Care – PPO

## 2021-01-13 ENCOUNTER — Other Ambulatory Visit: Payer: Self-pay

## 2021-01-13 ENCOUNTER — Inpatient Hospital Stay
Admission: EM | Admit: 2021-01-13 | Discharge: 2021-02-19 | DRG: 871 | Disposition: A | Discharge status: Home Health | Payer: BC Managed Care – PPO | Attending: Internal Medicine | Admitting: Internal Medicine

## 2021-01-13 DIAGNOSIS — A419 Sepsis, unspecified organism: Secondary | ICD-10-CM | POA: Diagnosis present

## 2021-01-13 DIAGNOSIS — Z7901 Long term (current) use of anticoagulants: Secondary | ICD-10-CM

## 2021-01-13 DIAGNOSIS — Z7951 Long term (current) use of inhaled steroids: Secondary | ICD-10-CM | POA: Diagnosis not present

## 2021-01-13 DIAGNOSIS — J811 Chronic pulmonary edema: Secondary | ICD-10-CM

## 2021-01-13 DIAGNOSIS — J984 Other disorders of lung: Secondary | ICD-10-CM | POA: Diagnosis present

## 2021-01-13 DIAGNOSIS — D62 Acute posthemorrhagic anemia: Secondary | ICD-10-CM | POA: Diagnosis not present

## 2021-01-13 DIAGNOSIS — R918 Other nonspecific abnormal finding of lung field: Secondary | ICD-10-CM

## 2021-01-13 DIAGNOSIS — Z8616 Personal history of COVID-19: Secondary | ICD-10-CM | POA: Diagnosis not present

## 2021-01-13 DIAGNOSIS — E1165 Type 2 diabetes mellitus with hyperglycemia: Secondary | ICD-10-CM | POA: Diagnosis present

## 2021-01-13 DIAGNOSIS — J9811 Atelectasis: Secondary | ICD-10-CM

## 2021-01-13 DIAGNOSIS — G7281 Critical illness myopathy: Secondary | ICD-10-CM | POA: Diagnosis present

## 2021-01-13 DIAGNOSIS — R0602 Shortness of breath: Secondary | ICD-10-CM

## 2021-01-13 DIAGNOSIS — R6520 Severe sepsis without septic shock: Secondary | ICD-10-CM | POA: Diagnosis present

## 2021-01-13 DIAGNOSIS — R778 Other specified abnormalities of plasma proteins: Secondary | ICD-10-CM

## 2021-01-13 DIAGNOSIS — T380X5A Adverse effect of glucocorticoids and synthetic analogues, initial encounter: Secondary | ICD-10-CM | POA: Diagnosis not present

## 2021-01-13 DIAGNOSIS — Z880 Allergy status to penicillin: Secondary | ICD-10-CM

## 2021-01-13 DIAGNOSIS — D75839 Thrombocytosis, unspecified: Secondary | ICD-10-CM | POA: Diagnosis not present

## 2021-01-13 DIAGNOSIS — Z6820 Body mass index (BMI) 20.0-20.9, adult: Secondary | ICD-10-CM | POA: Diagnosis not present

## 2021-01-13 DIAGNOSIS — E119 Type 2 diabetes mellitus without complications: Secondary | ICD-10-CM

## 2021-01-13 DIAGNOSIS — R0902 Hypoxemia: Secondary | ICD-10-CM

## 2021-01-13 DIAGNOSIS — L8990 Pressure ulcer of unspecified site, unspecified stage: Secondary | ICD-10-CM | POA: Insufficient documentation

## 2021-01-13 DIAGNOSIS — I482 Chronic atrial fibrillation, unspecified: Secondary | ICD-10-CM | POA: Diagnosis present

## 2021-01-13 DIAGNOSIS — I248 Other forms of acute ischemic heart disease: Secondary | ICD-10-CM | POA: Diagnosis present

## 2021-01-13 DIAGNOSIS — E43 Unspecified severe protein-calorie malnutrition: Secondary | ICD-10-CM | POA: Diagnosis present

## 2021-01-13 DIAGNOSIS — J470 Bronchiectasis with acute lower respiratory infection: Secondary | ICD-10-CM | POA: Diagnosis present

## 2021-01-13 DIAGNOSIS — Z79899 Other long term (current) drug therapy: Secondary | ICD-10-CM

## 2021-01-13 DIAGNOSIS — J849 Interstitial pulmonary disease, unspecified: Secondary | ICD-10-CM | POA: Diagnosis not present

## 2021-01-13 DIAGNOSIS — J189 Pneumonia, unspecified organism: Secondary | ICD-10-CM | POA: Diagnosis present

## 2021-01-13 DIAGNOSIS — K5900 Constipation, unspecified: Secondary | ICD-10-CM | POA: Diagnosis present

## 2021-01-13 DIAGNOSIS — Z86711 Personal history of pulmonary embolism: Secondary | ICD-10-CM

## 2021-01-13 DIAGNOSIS — R9389 Abnormal findings on diagnostic imaging of other specified body structures: Secondary | ICD-10-CM

## 2021-01-13 DIAGNOSIS — Z20822 Contact with and (suspected) exposure to covid-19: Secondary | ICD-10-CM | POA: Diagnosis present

## 2021-01-13 DIAGNOSIS — Z452 Encounter for adjustment and management of vascular access device: Secondary | ICD-10-CM | POA: Diagnosis not present

## 2021-01-13 DIAGNOSIS — R609 Edema, unspecified: Secondary | ICD-10-CM

## 2021-01-13 DIAGNOSIS — I480 Paroxysmal atrial fibrillation: Secondary | ICD-10-CM | POA: Diagnosis present

## 2021-01-13 DIAGNOSIS — D849 Immunodeficiency, unspecified: Secondary | ICD-10-CM | POA: Diagnosis present

## 2021-01-13 DIAGNOSIS — E46 Unspecified protein-calorie malnutrition: Secondary | ICD-10-CM | POA: Diagnosis not present

## 2021-01-13 DIAGNOSIS — J9621 Acute and chronic respiratory failure with hypoxia: Secondary | ICD-10-CM | POA: Diagnosis present

## 2021-01-13 DIAGNOSIS — E871 Hypo-osmolality and hyponatremia: Secondary | ICD-10-CM

## 2021-01-13 DIAGNOSIS — J8410 Pulmonary fibrosis, unspecified: Secondary | ICD-10-CM | POA: Diagnosis present

## 2021-01-13 DIAGNOSIS — D649 Anemia, unspecified: Secondary | ICD-10-CM | POA: Diagnosis not present

## 2021-01-13 DIAGNOSIS — C8290 Follicular lymphoma, unspecified, unspecified site: Secondary | ICD-10-CM | POA: Diagnosis present

## 2021-01-13 DIAGNOSIS — E11649 Type 2 diabetes mellitus with hypoglycemia without coma: Secondary | ICD-10-CM | POA: Diagnosis not present

## 2021-01-13 DIAGNOSIS — D72829 Elevated white blood cell count, unspecified: Secondary | ICD-10-CM

## 2021-01-13 DIAGNOSIS — Z7189 Other specified counseling: Secondary | ICD-10-CM | POA: Diagnosis not present

## 2021-01-13 DIAGNOSIS — L89152 Pressure ulcer of sacral region, stage 2: Secondary | ICD-10-CM | POA: Diagnosis not present

## 2021-01-13 DIAGNOSIS — L89159 Pressure ulcer of sacral region, unspecified stage: Secondary | ICD-10-CM | POA: Diagnosis not present

## 2021-01-13 DIAGNOSIS — Z833 Family history of diabetes mellitus: Secondary | ICD-10-CM | POA: Diagnosis not present

## 2021-01-13 HISTORY — DX: Chronic respiratory failure with hypoxia: J96.11

## 2021-01-13 HISTORY — DX: Interstitial pulmonary disease, unspecified: J84.9

## 2021-01-13 LAB — COMPREHENSIVE METABOLIC PANEL
ALT: 37 U/L (ref 0–44)
AST: 50 U/L — ABNORMAL HIGH (ref 15–41)
Albumin: 3.1 g/dL — ABNORMAL LOW (ref 3.5–5.0)
Alkaline Phosphatase: 109 U/L (ref 38–126)
Anion gap: 16 — ABNORMAL HIGH (ref 5–15)
BUN: 16 mg/dL (ref 8–23)
CO2: 23 mmol/L (ref 22–32)
Calcium: 9.3 mg/dL (ref 8.9–10.3)
Chloride: 92 mmol/L — ABNORMAL LOW (ref 98–111)
Creatinine, Ser: 0.79 mg/dL (ref 0.61–1.24)
GFR, Estimated: >60 mL/min (ref >60)
Glucose, Bld: 181 mg/dL — ABNORMAL HIGH (ref 70–99)
Potassium: 4.3 mmol/L (ref 3.5–5.1)
Sodium: 131 mmol/L — ABNORMAL LOW (ref 135–145)
Total Bilirubin: 0.7 mg/dL (ref 0.3–1.2)
Total Protein: 7.2 g/dL (ref 6.5–8.1)

## 2021-01-13 LAB — CBC WITH DIFFERENTIAL/PLATELET
Abs Immature Granulocytes: 0.52 10*3/uL — ABNORMAL HIGH (ref 0.00–0.07)
Basophils Absolute: 0.1 10*3/uL (ref 0.0–0.1)
Basophils Relative: 1 %
Eosinophils Absolute: 0.1 10*3/uL (ref 0.0–0.5)
Eosinophils Relative: 1 %
HCT: 41.6 % (ref 39.0–52.0)
Hemoglobin: 13.5 g/dL (ref 13.0–17.0)
Immature Granulocytes: 4 %
Lymphocytes Relative: 10 %
Lymphs Abs: 1.3 10*3/uL (ref 0.7–4.0)
MCH: 27.6 pg (ref 26.0–34.0)
MCHC: 32.5 g/dL (ref 30.0–36.0)
MCV: 84.9 fL (ref 80.0–100.0)
Monocytes Absolute: 0.8 10*3/uL (ref 0.1–1.0)
Monocytes Relative: 7 %
Neutro Abs: 9.5 10*3/uL — ABNORMAL HIGH (ref 1.7–7.7)
Neutrophils Relative %: 77 %
Platelets: 440 10*3/uL — ABNORMAL HIGH (ref 150–400)
RBC: 4.9 MIL/uL (ref 4.22–5.81)
RDW: 16.8 % — ABNORMAL HIGH (ref 11.5–15.5)
WBC: 12.3 10*3/uL — ABNORMAL HIGH (ref 4.0–10.5)
nRBC: 0 % (ref 0.0–0.2)

## 2021-01-13 LAB — BRAIN NATRIURETIC PEPTIDE: B Natriuretic Peptide: 85.5 pg/mL (ref 0.0–100.0)

## 2021-01-13 LAB — TROPONIN I (HIGH SENSITIVITY)
Troponin I (High Sensitivity): 13 ng/L (ref <18)
Troponin I (High Sensitivity): 22 ng/L — ABNORMAL HIGH (ref <18)

## 2021-01-13 LAB — RESP PANEL BY RT-PCR (FLU A&B, COVID) ARPGX2
Influenza A by PCR: NEGATIVE
Influenza B by PCR: NEGATIVE
SARS Coronavirus 2 by RT PCR: NEGATIVE

## 2021-01-13 LAB — MAGNESIUM: Magnesium: 2.3 mg/dL (ref 1.7–2.4)

## 2021-01-13 LAB — PROCALCITONIN: Procalcitonin: 0.96 ng/mL

## 2021-01-13 LAB — LACTIC ACID, PLASMA: Lactic Acid, Venous: 0.9 mmol/L (ref 0.5–1.9)

## 2021-01-13 MED ORDER — SODIUM CHLORIDE 0.9 % IV SOLN
2.0000 g | Freq: Three times a day (TID) | INTRAVENOUS | Status: DC
  Administered 2021-01-14 – 2021-01-19 (×18): 2 g via INTRAVENOUS
  Filled 2021-01-13 (×21): qty 2

## 2021-01-13 MED ORDER — INSULIN ASPART 100 UNIT/ML IJ SOLN
0.0000 [IU] | Freq: Three times a day (TID) | INTRAMUSCULAR | Status: DC
  Administered 2021-01-14 (×2): 5 [IU] via SUBCUTANEOUS
  Filled 2021-01-13 (×2): qty 1

## 2021-01-13 MED ORDER — IOHEXOL 350 MG/ML SOLN
75.0000 mL | Freq: Once | INTRAVENOUS | Status: AC | PRN
  Administered 2021-01-13: 75 mL via INTRAVENOUS

## 2021-01-13 MED ORDER — SODIUM CHLORIDE 0.9 % IV SOLN
500.0000 mg | INTRAVENOUS | Status: AC
  Administered 2021-01-14 – 2021-01-20 (×6): 500 mg via INTRAVENOUS
  Filled 2021-01-13 (×7): qty 500

## 2021-01-13 MED ORDER — SODIUM CHLORIDE 0.9 % IV SOLN: 2.0000 g | INTRAVENOUS | Status: DC

## 2021-01-13 MED ORDER — ACETAMINOPHEN 325 MG PO TABS
650.0000 mg | ORAL_TABLET | Freq: Four times a day (QID) | ORAL | Status: DC | PRN
  Administered 2021-01-18 – 2021-02-05 (×4): 650 mg via ORAL
  Filled 2021-01-13 (×5): qty 2

## 2021-01-13 MED ORDER — LEVALBUTEROL HCL 1.25 MG/0.5ML IN NEBU
1.2500 mg | INHALATION_SOLUTION | RESPIRATORY_TRACT | Status: DC | PRN
  Administered 2021-01-17 (×2): 1.25 mg via RESPIRATORY_TRACT
  Filled 2021-01-13 (×3): qty 0.5

## 2021-01-13 MED ORDER — ACETAMINOPHEN 650 MG RE SUPP
650.0000 mg | Freq: Four times a day (QID) | RECTAL | Status: DC | PRN
  Filled 2021-01-13: qty 1

## 2021-01-13 MED ORDER — LEVALBUTEROL HCL 1.25 MG/0.5ML IN NEBU
1.2500 mg | INHALATION_SOLUTION | Freq: Four times a day (QID) | RESPIRATORY_TRACT | Status: DC
  Administered 2021-01-13 – 2021-01-14 (×4): 1.25 mg via RESPIRATORY_TRACT
  Filled 2021-01-13 (×7): qty 0.5

## 2021-01-13 MED ORDER — SODIUM CHLORIDE 0.9 % IV SOLN
1.0000 g | Freq: Once | INTRAVENOUS | Status: AC
  Administered 2021-01-13: 1 g via INTRAVENOUS
  Filled 2021-01-13: qty 10

## 2021-01-13 MED ORDER — METHYLPREDNISOLONE SODIUM SUCC 125 MG IJ SOLR
125.0000 mg | Freq: Once | INTRAMUSCULAR | Status: AC
  Administered 2021-01-13: 125 mg via INTRAVENOUS
  Filled 2021-01-13: qty 2

## 2021-01-13 MED ORDER — LACTATED RINGERS IV SOLN: INTRAVENOUS | Status: AC

## 2021-01-13 MED ORDER — METHYLPREDNISOLONE SODIUM SUCC 125 MG IJ SOLR
80.0000 mg | Freq: Two times a day (BID) | INTRAMUSCULAR | Status: DC
  Administered 2021-01-14 – 2021-01-15 (×3): 80 mg via INTRAVENOUS
  Filled 2021-01-13 (×3): qty 2

## 2021-01-13 MED ORDER — APIXABAN 2.5 MG PO TABS
2.5000 mg | ORAL_TABLET | Freq: Two times a day (BID) | ORAL | Status: DC
  Administered 2021-01-14 – 2021-01-19 (×12): 2.5 mg via ORAL
  Filled 2021-01-13 (×13): qty 1

## 2021-01-13 MED ORDER — IPRATROPIUM BROMIDE 0.02 % IN SOLN
0.5000 mg | Freq: Four times a day (QID) | RESPIRATORY_TRACT | Status: DC
  Administered 2021-01-13 – 2021-01-15 (×9): 0.5 mg via RESPIRATORY_TRACT
  Filled 2021-01-13 (×9): qty 2.5

## 2021-01-13 MED ORDER — MONTELUKAST SODIUM 10 MG PO TABS
10.0000 mg | ORAL_TABLET | Freq: Every day | ORAL | Status: DC
  Administered 2021-01-14 (×2): 10 mg via ORAL
  Filled 2021-01-13 (×2): qty 1

## 2021-01-13 MED ORDER — SODIUM CHLORIDE 0.9 % IV SOLN
500.0000 mg | Freq: Once | INTRAVENOUS | Status: AC
  Administered 2021-01-13: 500 mg via INTRAVENOUS
  Filled 2021-01-13: qty 500

## 2021-01-13 NOTE — ED Provider Notes (Signed)
[facility] Emergency Department Provider Note   ____________________________________________   Event Date/Time   First MD Initiated Contact with Patient 01/13/21 1346     (approximate)  I have reviewed the triage vital signs and the nursing notes.   HISTORY  Chief Complaint Respiratory Distress  HPI Edward Atkinson is a 60 y.o. male presents via EMS for shortness of breath  LOCATION: Chest DURATION: 3 hours prior to arrival TIMING: Worsening since onset SEVERITY: Severe QUALITY: Shortness of breath CONTEXT: Patient has history of interstitial lung disease and presents with acute onset worsening of chronic respiratory failure MODIFYING FACTORS: Worsened with exertion and partially relieved with submental oxygen and CPAP via EMS ASSOCIATED SYMPTOMS: Denies   Per medical record review, patient has history of type 2 diabetes, recent COVID infection, and interstitial lung disease as well as lymphoma          Past Medical History:  Diagnosis Date   COVID-19    Diabetes mellitus without complication ([place])    Diverticulitis    Follicular lymphoma of intra-abdominal lymph nodes ([place]) 02/15/2019    Patient Active Problem List   Diagnosis Date Noted   Encounter for monoclonal antibody treatment for malignancy 07/02/2020   Interstitial lung disease ([place]) 07/02/2020   Lymphocytopenia    SOB (shortness of breath)    Sepsis ([place]) 07/29/2019   Thrombocytopenia ([place]) 07/29/2019   History of DVT (deep vein thrombosis) 07/06/2019   Transaminitis 07/06/2019   History of pulmonary embolism 07/06/2019   Encounter for antineoplastic chemotherapy 04/29/2019   Pulmonary embolus ([place]) 03/54/6568   Follicular lymphoma of intra-abdominal lymph nodes ([place]) 02/15/2019   Goals of care, counseling/discussion 02/15/2019   DVT (deep venous thrombosis) ([place]) 02/12/2019   Prostatitis 12/75/1700   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   Other  pulmonary embolism with acute cor pulmonale ([place]) 02/06/2019   Type 2 diabetes mellitus without complication, without long-term current use of insulin ([place]) 02/06/2019   Massive pulmonary hemorrhage originating in perinatal period 01/29/2019    Past Surgical History:  Procedure Laterality Date   CHOLECYSTECTOMY     COLON RESECTION     DUE TO DIVERTICULITIS   FLEXIBLE BRONCHOSCOPY N/A 12/02/2019   Procedure: FLEXIBLE BRONCHOSCOPY;  Surgeon: Ottie Glazier, MD;  Location: [facility] ORS;  Service: Thoracic;  Laterality: N/A;   PORTA CATH INSERTION N/A 03/11/2019   Procedure: PORTA CATH INSERTION;  Surgeon: Algernon Huxley, MD;  Location: [place] [facility];  Service: Cardiovascular;  Laterality: N/A;   PULMONARY THROMBECTOMY N/A 01/29/2019   Procedure: PULMONARY THROMBECTOMY;  Surgeon: Algernon Huxley, MD;  Location: [place] [facility];  Service: Cardiovascular;  Laterality: N/A;    Prior to Admission medications   Medication Sig Start Date End Date Taking? Authorizing Provider  acyclovir (ZOVIRAX) 400 MG tablet Take 1 tablet (400 mg total) by mouth 2 (two) times daily. 08/27/20   Earlie Server, MD  albuterol (PROVENTIL) (2.5 MG/3ML) 0.083% nebulizer solution Inhale into the lungs. 11/20/20 11/20/21  [provider]  albuterol (VENTOLIN HFA) 108 (90 Base) MCG/ACT inhaler  08/09/19   [provider]  BREZTRI AEROSPHERE 160-9-4.8 MCG/ACT [place] DAILY 12/24/19   [provider]  budesonide (PULMICORT) 1 MG/2ML nebulizer solution Take 1 mg by nebulization every morning. 09/25/19 09/24/20  [provider]  budesonide (PULMICORT) 1 MG/2ML nebulizer solution Inhale into the lungs. 12/18/20 01/17/21  [provider]  COMBIVENT RESPIMAT 20-100 MCG/ACT AERS respimat  10/08/20  [provider]  dextromethorphan-guaiFENesin (MUCINEX DM) 30-600 MG 12hr tablet Take 1 tablet by mouth 2 (two) times daily as needed for cough.     [provider]  ELIQUIS 2.5 MG TABS tablet TAKE 1 TABLET BY MOUTH TWICE A DAY 01/04/21   Earlie Server, MD  glimepiride (AMARYL) 2 MG tablet Take 2 mg by mouth every morning. 11/30/20   [provider]  glimepiride (AMARYL) 4 MG tablet Take 2.5 mg by mouth daily with breakfast. Patient not taking: No sig reported    [provider]  lidocaine-prilocaine (EMLA) cream Apply 1 application topically as needed. 01/07/20   Earlie Server, MD  metFORMIN (GLUCOPHAGE) 500 MG tablet Take 500 mg by mouth 3 (three) times daily with meals.  10/16/19 10/15/21  [provider]  montelukast (SINGULAIR) 10 MG tablet Take 10 mg by mouth at bedtime.  09/13/19 09/12/21  [provider]  prochlorperazine (COMPAZINE) 10 MG tablet Take 1 tablet (10 mg total) by mouth every 6 (six) hours as needed (Nausea or vomiting). 02/15/19   Earlie Server, MD    Allergies Penicillins  Family History  Problem Relation Age of Onset   Diabetes Brother     Social History Social History   Tobacco Use   Smoking status: Never   Smokeless tobacco: Never  Vaping Use   Vaping Use: Never used  Substance Use Topics   Alcohol use: Yes    Alcohol/week: 0.0 - 2.0 standard drinks    Comment: occasional   Drug use: Never    Review of Systems Constitutional: No fever/chills Eyes: No visual changes. ENT: No sore throat. Cardiovascular: Denies chest pain. Respiratory: Endorses shortness of breath. Gastrointestinal: No abdominal pain.  No nausea, no vomiting.  No diarrhea. Genitourinary: Negative for dysuria. Musculoskeletal: Negative for acute arthralgias Skin: Negative for rash. Neurological: Negative for headaches, weakness/numbness/paresthesias in any extremity Psychiatric: Negative for suicidal ideation/homicidal ideation   ____________________________________________   PHYSICAL EXAM:  VITAL SIGNS: ED Triage Vitals  Enc Vitals Group     BP 01/13/21 1400 (!) 155/89     Pulse Rate 01/13/21 1333 (!) 139     Resp  01/13/21 1333 (!) 58     Temp 01/13/21 1408 98 F (36.7 C)     Temp Source 01/13/21 1408 Axillary     SpO2 01/13/21 1330 95 %     Weight 01/13/21 1335 140 lb (63.5 kg)     Height 01/13/21 1335 6' (1.829 m)     Head Circumference --      Peak Flow --      Pain Score 01/13/21 1335 0     Pain Loc --      Pain Edu? --      Excl. in [place]? --    Constitutional: Alert and oriented.  Elderly Caucasian male who appears stated age and in moderate respiratory distress Eyes: Conjunctivae are normal. PERRL. Head: Atraumatic. Nose: No congestion/rhinnorhea. Mouth/Throat: Mucous membranes are moist. Neck: No stridor Cardiovascular: Grossly normal heart sounds.  Good peripheral circulation. Respiratory: Increased respiratory effort with retractions.  BiPAP in place Gastrointestinal: Soft and nontender. No distention. Musculoskeletal: No obvious deformities Neurologic:  Normal speech and language. No gross focal neurologic deficits are appreciated. Skin:  Skin is warm and dry. No rash noted. Psychiatric: Mood and affect are normal. Speech and behavior are normal.  ____________________________________________   LABS (all labs ordered are listed, but only abnormal results are displayed)  Labs Reviewed  CBC WITH DIFFERENTIAL/PLATELET - Abnormal; Notable  for the following components:      Result Value   WBC 12.3 (*)    RDW 16.8 (*)    Platelets 440 (*)    Neutro Abs 9.5 (*)    Abs Immature Granulocytes 0.52 (*)    All other components within normal limits  COMPREHENSIVE METABOLIC PANEL - Abnormal; Notable for the following components:   Sodium 131 (*)    Chloride 92 (*)    Glucose, Bld 181 (*)    Albumin 3.1 (*)    AST 50 (*)    Anion gap 16 (*)    All other components within normal limits  RESP PANEL BY RT-PCR (FLU A&B, COVID) ARPGX2  BRAIN NATRIURETIC PEPTIDE  TROPONIN I (HIGH SENSITIVITY)  TROPONIN I (HIGH SENSITIVITY)   RADIOLOGY  ED MD interpretation: Single view portable chest  x-ray shows low lung volumes with suspected mild pulmonary interstitial edema.  In the setting of patient's interstitial lung disease, concern for exacerbation  Official radiology report(s): DG Chest Port 1 View  Result Date: 01/13/2021 CLINICAL DATA:  Shortness of breath EXAM: PORTABLE CHEST 1 VIEW COMPARISON:  Chest radiograph 07/29/2019 FINDINGS: There is a right chest wall port with the tip terminating in the lower SVC/cavoatrial junction. The cardiomediastinal silhouette is within normal limits. Lung volumes are low with unchanged asymmetric elevation of the right hemidiaphragm. There are increased interstitial markings with diffuse reticular opacities throughout both lungs. There is no focal consolidation. There is no significant pleural effusion. There is no pneumothorax. There is no acute osseous abnormality. There is gaseous distention of the stomach and large bowel in the right upper quadrant. IMPRESSION: Low lung volumes with suspected mild pulmonary interstitial edema. Atypical/viral infection could have a similar appearance. Electronically Signed   By: Valetta Mole M.D.   On: 01/13/2021 14:24    ____________________________________________   PROCEDURES  Procedure(s) performed (including Critical Care):  .1-3 Lead EKG Interpretation Performed by: Naaman Plummer, MD Authorized by: Naaman Plummer, MD     Interpretation: abnormal     ECG rate:  135   ECG rate assessment: tachycardic     Rhythm: sinus tachycardia     Ectopy: none     Conduction: normal    CRITICAL CARE Performed by: Naaman Plummer   Total critical care time: 37 minutes  Critical care time was exclusive of separately billable procedures and treating other patients.  Critical care was necessary to treat or prevent imminent or life-threatening deterioration.  Critical care was time spent personally by me on the following activities: development of treatment plan with patient and/or surrogate as well as  nursing, discussions with consultants, evaluation of patient's response to treatment, examination of patient, obtaining history from patient or surrogate, ordering and performing treatments and interventions, ordering and review of laboratory studies, ordering and review of radiographic studies, pulse oximetry and re-evaluation of patient's condition.  ____________________________________________   INITIAL IMPRESSION / ASSESSMENT AND PLAN / ED COURSE  As part of my medical decision making, I reviewed the following data within the electronic medical record, if available:  Nursing notes reviewed and incorporated, Labs reviewed, EKG interpreted, Old chart reviewed, Radiograph reviewed and Notes from prior ED visits reviewed and incorporated        The patient is suffering from shortness of breath, but the immediate cause is not apparent.  Respiratory status significantly improved on BiPAP  Potential causes considered include, but are not limited to, asthma or COPD, congestive heart failure, pulmonary embolism, pneumothorax, coronary  syndrome, pneumonia, and pleural effusion.  Work-up: CT angiography of the chest pending at the time of change of shift however anticipate admission  Care of this patient will be signed out to the oncoming physician at the end of my shift.  All pertinent patient information conveyed and all questions answered.  All further care and disposition decisions will be made by the oncoming physician.      ____________________________________________   FINAL CLINICAL IMPRESSION(S) / ED DIAGNOSES  Final diagnoses:  Shortness of breath  Hypoxia     ED Discharge Orders     None        Note:  This document was prepared using Dragon voice recognition software and may include unintentional dictation errors.    Naaman Plummer, MD 01/13/21 [phone]

## 2021-01-13 NOTE — ED Provider Notes (Signed)
-----------------------------------------   5:46 PM on 01/13/2021 ----------------------------------------- Patient care assumed from Dr. Percell Boston.  Patient CTA of the chest is negative for PE but does show multifocal bilateral groundglass opacities/airspace disease.  Likely worsening interstitial lung disease however patient states he has been febrile over the weekend as well likely indicating superimposed infection.  We will cover with Rocephin and Zithromax, send blood cultures.  Patient is now on 6 L nasal cannula satting 95% but continues to have respiratory rate around 30 to 35 breaths/min.  We will admit to the hospital service for further work-up and treatment.  Patient agreeable to plan of care.   Harvest Dark, MD 01/13/21 1747

## 2021-01-13 NOTE — ED Triage Notes (Signed)
Pt to ED via [facility] with resp distress, at home he was 49% on room  air, he received 2 duo neb times 2 with no improvement. [facility] put him on CPAP. Resp and MD at bedside and placed on bipap.

## 2021-01-13 NOTE — Consult Note (Signed)
Pharmacy Antibiotic Note  Edward Atkinson is a 60 y.o. male admitted on 01/13/2021 with pneumonia and sepsis.  Pharmacy has been consulted for Cefepime dosing.  Plan: Cefepime 2 gram q8h  Height: 6' (182.9 cm) Weight: 63.5 kg (140 lb) IBW/kg (Calculated) : 77.6  Temp (24hrs), Avg:98 F (36.7 C), Min:98 F (36.7 C), Max:98 F (36.7 C)  Recent Labs  Lab 01/08/21 1005 01/13/21 1333 01/13/21 1909  WBC 7.3 12.3*  --   CREATININE 0.59* 0.79  --   LATICACIDVEN  --   --  0.9    Estimated Creatinine Clearance: 86 mL/min (by C-G formula based on SCr of 0.79 mg/dL).    Allergies  Allergen Reactions   Penicillins Rash    Did it involve swelling of the face/tongue/throat, SOB, or low BP? No Did it involve sudden or severe rash/hives, skin peeling, or any reaction on the inside of your mouth or nose? No Did you need to seek medical attention at a hospital or doctor's office? No When did it last happen?      Unknown If all above answers are "NO", may proceed with cephalosporin use.    Antimicrobials this admission: 9/14 ceftriaxone >> x 1  9/14 azithromycin >>  9/14 cefepime >>   Microbiology results: 9/14 BCx: sent  Thank you for allowing pharmacy to be a part of this patient's care.  Dorothe Pea, PharmD, BCPS Clinical Pharmacist   01/13/2021 11:10 PM

## 2021-01-13 NOTE — ED Notes (Signed)
Pt called out asking for water, explained that the Bipap would force the water down his wind pipe and he could aspirate, he states that he is feeling better and felt that he could come off of his BiPap if the doctor agreed. Pt was no longer tachyapenic. MD was ok with me puttig hi on 6L [place]. He is tolerating this well.

## 2021-01-13 NOTE — ED Notes (Signed)
Rainbow, lactic acid, and BC 2x sent to lab.

## 2021-01-13 NOTE — H&P (Signed)
History and Physical    PLEASE NOTE THAT DRAGON DICT[employer] SOFTWARE WAS USED IN THE CONSTRUCTION OF THIS NOTE.   Edward Atkinson LYY:[id number] DOB: [birth year] DOA: 01/13/2021  PCP: Edward Aus, MD Patient coming from: home   I have personally briefly reviewed patient's old medical records in [place]  Chief Complaint: Shortness of breath  HPI: Edward Atkinson is a 60 y.o. male with medical history significant for chronic hypoxic respiratory failure on 2 L continuous nasal cannula in the setting of interstitial lung disease, type 2 diabetes mellitus, pulmonary embolism in 2020 now chronically anticoagulated on Eliquis, who is admitted to [facility] on 01/13/2021 with acute on chronic hypoxic respiratory failure after presenting from home to [facility] ED complaining of shortness of breath.   The patient reports 5 days of progressive shortness of breath associated with subjective fever and new onset nonproductive cough. Denies any associated orthopnea, PND, or new onset peripheral edema. No recent chest pain, diaphoresis, palpitations, N/V, pre-syncope, or syncope.  Not associated with any wheezing, hemoptysis, new lower extremity erythema, or calf tenderness. Denies any recent trauma, travel, surgical procedures, or periods of prolonged diminished ambulatory status. No recent melena or hematochezia.   While he notes associated subjective fever, he denies any associated chills, rigors, or generalized myalgias. No recent headache, neck stiffness, rhinitis, rhinorrhea, sore throat, abdominal pain, diarrhea, or rash. No known recent COVID-19 exposures. Denies dysuria, gross hematuria, or change in urinary urgency/frequency.   The patient confirms chronic hypoxic respiratory failure on continuous 2 L nasal cannula at baseline in the setting of interstitial lung disease.  He reports compliance with his outpatient respiratory regimen which includes Breztri, Pulmicort nebulizer,  and as needed albuterol inhaler.  He conveys that he is a lifelong non-smoker.  In the setting of his interstitial lung disease, the patient reports that last week, that he has been weaned off of systemic corticosteroids by his outpatient pulmonologist, last dose of systemic corticosteroids occurring a few days before the onset of his presenting progressive shortness of breath.     ED Course:  Vital signs in the ED were notable for the following: Temperature max 98.0; initial heart rate 125 139, with most recent heart rate noted to be 118; blood pressure 111/73 -129/80; respiratory rate 21-39, with most recent respiratory rate noted to be 21; initial oxygen saturation found to be 70% on baseline 2 L nasal cannula, prompting initiation of BiPAP, with associated ensuing oxygen saturation of 98 to 99% on 100% FiO2; subsequently, BiPAP was transitioned to 6 L nasal cannula upon which patient's oxygen saturations have been in the range of 95 to 90% over the last 2 to 3 hours following discontinuation of aforementioned BiPAP.  Labs were notable for the following: CMP was notable for the following: Sodium 131 compared to 133 on 01/08/2021 as well as 10/22/2020, bicarbonate 23, creatinine 0.79 relative to baseline range of 0.751.0, glucose 181, liver enzymes were found to be within normal limits.  BNP 85.  High-sensitivity troponin initially noted to be 33, with repeat value trending up slightly to 22.  CBC notable for white blood cell count 12,300 compared to 7300 on 01/08/2021.  Lactic acid 0.9.  COVID-19/influenza PCR were checked in the ED today and found to be negative.  Blood cultures x2 collected prior to initiation of antibiotics in the ED.  EKG shows sinus tachycardia with heart rate 125, normal intervals, no evidence of T wave or ST changes, including no evidence of  ST elevation.  CTA chest shows no evidence of acute pulmonary embolism, while showing evidence of progressive multifocal bilateral groundglass  airspace disease, greatest at the lung bases, potentially representing multifocal pneumonia, while demonstrating no evidence of edema, pneumothorax, pleural effusion, and also showing evidence of stable bronchiectasis.  While in the ED, the following were administered: Solu-Medrol 125 mg IV x1, azithromycin, Rocephin.  Subsequently, the patient was admitted to the PCU for further evaluation and management of presenting acute on chronic hypoxic respiratory failure in the setting of interstitial lung disease exacerbation as well as potential underlying pneumonia with related severe sepsis.     Review of Systems: As per HPI otherwise 10 point review of systems negative.   Past Medical History:  Diagnosis Date   COVID-19    Diabetes mellitus without complication ([place])    Diverticulitis    Follicular lymphoma of intra-abdominal lymph nodes ([place]) 02/15/2019    Past Surgical History:  Procedure Laterality Date   CHOLECYSTECTOMY     COLON RESECTION     DUE TO DIVERTICULITIS   FLEXIBLE BRONCHOSCOPY N/A 12/02/2019   Procedure: FLEXIBLE BRONCHOSCOPY;  Surgeon: Ottie Glazier, MD;  Location: [facility] ORS;  Service: Thoracic;  Laterality: N/A;   PORTA CATH INSERTION N/A 03/11/2019   Procedure: PORTA CATH INSERTION;  Surgeon: Algernon Huxley, MD;  Location: [place] [facility];  Service: Cardiovascular;  Laterality: N/A;   PULMONARY THROMBECTOMY N/A 01/29/2019   Procedure: PULMONARY THROMBECTOMY;  Surgeon: Algernon Huxley, MD;  Location: [place] [facility];  Service: Cardiovascular;  Laterality: N/A;    Social History:  reports that he has never smoked. He has never used smokeless tobacco. He reports current alcohol use. He reports that he does not use drugs.   Allergies  Allergen Reactions   Penicillins Rash    Did it involve swelling of the face/tongue/throat, SOB, or low BP? No Did it involve sudden or severe rash/hives, skin peeling, or any reaction on the inside of your mouth or nose? No Did you  need to seek medical attention at a hospital or doctor's office? No When did it last happen?      Unknown If all above answers are "NO", may proceed with cephalosporin use.    Family History  Problem Relation Age of Onset   Diabetes Brother     Family history reviewed and not pertinent    Prior to Admission medications   Medication Sig Start Date End Date Taking? Authorizing Provider  acyclovir (ZOVIRAX) 400 MG tablet Take 1 tablet (400 mg total) by mouth 2 (two) times daily. 08/27/20   Earlie Server, MD  albuterol (PROVENTIL) (2.5 MG/3ML) 0.083% nebulizer solution Inhale into the lungs. 11/20/20 11/20/21  [provider]  albuterol (VENTOLIN HFA) 108 (90 Base) MCG/ACT inhaler  08/09/19   [provider]  BREZTRI AEROSPHERE 160-9-4.8 MCG/ACT [place] DAILY 12/24/19   [provider]  budesonide (PULMICORT) 1 MG/2ML nebulizer solution Take 1 mg by nebulization every morning. 09/25/19 09/24/20  [provider]  budesonide (PULMICORT) 1 MG/2ML nebulizer solution Inhale into the lungs. 12/18/20 01/17/21  [provider]  COMBIVENT RESPIMAT 20-100 MCG/ACT AERS respimat  10/08/20   [provider]  dextromethorphan-guaiFENesin (MUCINEX DM) 30-600 MG 12hr tablet Take 1 tablet by mouth 2 (two) times daily as needed for cough.     [provider]  ELIQUIS 2.5 MG TABS tablet TAKE 1 TABLET BY MOUTH TWICE A DAY 01/04/21   Earlie Server, MD  glimepiride (AMARYL) 2 MG tablet Take 2 mg by mouth every morning. 11/30/20   [provider]  glimepiride (AMARYL) 4 MG tablet Take 2.5 mg by mouth daily with breakfast. Patient not taking: No sig reported    [provider]  lidocaine-prilocaine (EMLA) cream Apply 1 application topically as needed. 01/07/20   Earlie Server, MD  metFORMIN (GLUCOPHAGE) 500 MG tablet Take 500 mg by mouth 3 (three) times daily with meals.  10/16/19 10/15/21  [provider]  montelukast (SINGULAIR)  10 MG tablet Take 10 mg by mouth at bedtime.  09/13/19 09/12/21  [provider]  prochlorperazine (COMPAZINE) 10 MG tablet Take 1 tablet (10 mg total) by mouth every 6 (six) hours as needed (Nausea or vomiting). 02/15/19   Earlie Server, MD     Objective    Physical Exam: Vitals:   01/13/21 1500 01/13/21 1621 01/13/21 1730 01/13/21 1745  BP: 122/82 128/79 111/73   Pulse: (!) 139 (!) 125 (!) 123 (!) 123  Resp: (!) 39 (!) 26 (!) 21 (!) 43  Temp:      TempSrc:      SpO2: 98% 95% 97% 97%  Weight:      Height:        General: appears to be stated age; alert, oriented; increased work of breathing noted. Skin: warm, dry, no rash Head:  AT/[place] Mouth:  Oral mucosa membranes appear moist, normal dentition Neck: supple; trachea midline Heart: Mildly tachycardic, but regular; did not appreciate any M/R/G Lungs: CTAB, did not appreciate any wheezes, rales, or rhonchi Abdomen: + BS; soft, ND, NT Vascular: 2+ pedal pulses b/l; 2+ radial pulses b/l Extremities: no peripheral edema, no muscle wasting Neuro: strength and sensation intact in upper and lower extremities b/l     Labs on Admission: I have personally reviewed following labs and imaging studies  CBC: Recent Labs  Lab 01/08/21 1005 01/13/21 1333  WBC 7.3 12.3*  NEUTROABS 5.8 9.5*  HGB 12.4* 13.5  HCT 38.6* 41.6  MCV 83.7 84.9  PLT 254 419*   Basic Metabolic Panel: Recent Labs  Lab 01/08/21 1005 01/13/21 1333  NA 133* 131*  K 4.1 4.3  CL 97* 92*  CO2 28 23  GLUCOSE 123* 181*  BUN 17 16  CREATININE 0.59* 0.79  CALCIUM 9.0 9.3   GFR: Estimated Creatinine Clearance: 86 mL/min (by C-G formula based on SCr of 0.79 mg/dL). Liver Function Tests: Recent Labs  Lab 01/08/21 1005 01/13/21 1333  AST 23 50*  ALT 23 37  ALKPHOS 75 109  BILITOT 0.7 0.7  PROT 7.0 7.2  ALBUMIN 3.3* 3.1*   No results for input(s): LIPASE, AMYLASE in the last 168 hours. No results for input(s): AMMONIA in the last 168  hours. Coagulation Profile: No results for input(s): INR, PROTIME in the last 168 hours. Cardiac Enzymes: No results for input(s): CKTOTAL, CKMB, CKMBINDEX, TROPONINI in the last 168 hours. BNP (last 3 results) No results for input(s): PROBNP in the last 8760 hours. HbA1C: No results for input(s): HGBA1C in the last 72 hours. CBG: No results for input(s): GLUCAP in the last 168 hours. Lipid Profile: No results for input(s): CHOL, HDL, LDLCALC, TRIG, CHOLHDL, LDLDIRECT in the last 72 hours. Thyroid Function Tests: No results for input(s): TSH, T4TOTAL, FREET4, T3FREE, THYROIDAB in the last 72 hours. Anemia Panel: No results for input(s): VITAMINB12, FOLATE, FERRITIN, TIBC, IRON, RETICCTPCT in the last 72 hours. Urine analysis:    Component Value Date/Time   COLORURINE YELLOW (A)  07/29/2019 1103   APPEARANCEUR CLEAR (A) 07/29/2019 1103   LABSPEC 1.019 07/29/2019 1103   PHURINE 6.0 07/29/2019 1103   GLUCOSEU NEGATIVE 07/29/2019 1103   HGBUR NEGATIVE 07/29/2019 1103   [place] 07/29/2019 1103   [place] 07/29/2019 1103   PROTEINUR 30 (A) 07/29/2019 1103   NITRITE NEGATIVE 07/29/2019 1103   LEUKOCYTESUR NEGATIVE 07/29/2019 1103    Radiological Exams on Admission: CT Angio Chest PE W/Cm &/Or Wo Cm  Result Date: 01/13/2021 CLINICAL DATA:  Respiratory distress, hypoxia EXAM: CT ANGIOGRAPHY CHEST WITH CONTRAST TECHNIQUE: Multidetector CT imaging of the chest was performed using the standard protocol during bolus administration of intravenous contrast. Multiplanar CT image reconstructions and MIPs were obtained to evaluate the vascular anatomy. CONTRAST:  41m OMNIPAQUE IOHEXOL 350 MG/ML SOLN COMPARISON:  10/26/2020, 01/13/2021 FINDINGS: Cardiovascular: This is a technically adequate evaluation of the pulmonary vasculature. No filling defects or pulmonary emboli. The heart is unremarkable without pericardial effusion. No evidence of thoracic aortic aneurysm or  dissection. Right chest wall port via internal jugular approach tip within the superior vena cava. Mediastinum/Nodes: No pathologic adenopathy within the mediastinum, hila, or axilla. Thyroid, trachea, and esophagus are unremarkable. Lungs/Pleura: There has been interval progression of the multifocal ground-glass airspace disease seen previously. Changes are most pronounced within the dependent lower lobes. No effusion or pneumothorax. The central airways are patent, with persistent bronchiectasis. Upper Abdomen: No acute abnormality. Musculoskeletal: No acute or destructive bony lesions. Reconstructed images demonstrate no additional findings. Review of the MIP images confirms the above findings. IMPRESSION: 1. No evidence of pulmonary embolus. 2. Progressive multifocal bilateral ground-glass airspace disease, greatest at the lung bases. The differential diagnosis would include progressive postinflammatory scarring and fibrosis, multifocal atypical pneumonia, or hypersensitivity/drug toxicity. 3. Stable bronchiectasis. Electronically Signed   By: MRanda NgoM.D.   On: 01/13/2021 17:13   DG Chest Port 1 View  Result Date: 01/13/2021 CLINICAL DATA:  Shortness of breath EXAM: PORTABLE CHEST 1 VIEW COMPARISON:  Chest radiograph 07/29/2019 FINDINGS: There is a right chest wall port with the tip terminating in the lower SVC/cavoatrial junction. The cardiomediastinal silhouette is within normal limits. Lung volumes are low with unchanged asymmetric elevation of the right hemidiaphragm. There are increased interstitial markings with diffuse reticular opacities throughout both lungs. There is no focal consolidation. There is no significant pleural effusion. There is no pneumothorax. There is no acute osseous abnormality. There is gaseous distention of the stomach and large bowel in the right upper quadrant. IMPRESSION: Low lung volumes with suspected mild pulmonary interstitial edema. Atypical/viral infection could  have a similar appearance. Electronically Signed   By: PValetta MoleM.D.   On: 01/13/2021 14:24     EKG: Independently reviewed, with result as described above.    Assessment/Plan   KKIRIN PASTORINOis a 60y.o. male with medical history significant for chronic hypoxic respiratory failure on 2 L continuous nasal cannula in the setting of interstitial lung disease, type 2 diabetes mellitus, pulmonary embolism in 2020 now chronically anticoagulated on Eliquis, who is [facility] with acute on chronic hypoxic respiratory failure [facility]   Principal Problem:   Acute on chronic respiratory failure with hypoxia (HCC) Active Problems:   Type 2 diabetes mellitus without complication, without long-term current use of insulin (HCC)   SOB (shortness of breath)   Interstitial lung disease (HCC)   Severe sepsis ([place]  CAP (community acquired pneumonia)   Elevated troponin   Protein calorie malnutrition ([place])      #) Acute on chronic hypoxic respiratory failure: In the setting of a documented history of chronic hypoxic respiratory failure on continuous 2 L nasal cannula is acute interstitial lung disease, criteria are met for acute exacerbation thereof on the basis of presenting oxygen saturation in the 70s on baseline 2 L nasal cannula, with will smoking oxygen saturation is now in the mid to high 90s on 6 L nasal cannula following brief course of BiPAP, as further outlined above.  This increase in oxygen requirement relative to baseline is suspected to be multifactorial in nature, with contributions from exacerbation of underlying interstitial lung disease, as well as suspected severe sepsis due to bilateral pneumonia, as further detailed below.  Furthermore, given timing of onset patient's progressive shortness of breath and close proximity to recent discontinuation of systemic corticosteroids  as an outpatient, there may be suggestion of an element steroid dependence associated with his underlying interstitial lung disease.  ACS is felt to be less likely at this time, in the absence of any associated chest pain, presenting EKG shows no evidence of acute ischemic changes.  Mildly elevated initial high-sensitivity troponin I felt to be as a consequence of supply demand mismatch in the setting of acute on chronic hypoxic respiratory failure as opposed to representing a type I process, as further detailed below.  Of note, CTA chest showed no evidence of acute pulmonary embolism. no clinical or radiographic evidence to suggest acute decompensated heart failure at this time.    Plan: Further evaluation management to suspected acute exacerbation of underlying interstitial lung disease, including scheduled Xopenex/ipratropium nebulizers, Solu-Medrol.  Check VBG.  Add on serum magnesium and phosphorus levels.  Trend serial troponin.  Spirometry and flutter valve been ordered.  Monitor continuous pulse oximetry.  Repeat CBC and BMP in the morning.  Further evaluation management of suspected severe sepsis due to bilateral pneumonia, including further broadening of IV antibiotics that initiated in the ED, with rationale for this broadening on the basis the presence of bronchiectasis, as further detailed below.      #) Acute exacerbation of interstitial lung disease: In the context of a documented history of interstitial lung disease: Suspect exacerbation in setting of 5 days of progressive shortness of breath associated with new onset nonproductive cough as well as resultant acute on chronic hypoxic respiratory failure as further quantified above.  Potential contributing factors include suspected severe sepsis due to bilateral pneumonia, as further detailed below, as well as potential element of steroid dependence given relative timing of onset of progressive shortness of breath and close proximity to  recent discontinuation systemic or gastritis as an outpatient, as further detailed above.  Patient reports good compliance with his outpatient respiratory regimen, as further outlined above.  In the context of acute interstitial lung disease exacerbation with evidence of groundglass opacities on presenting CTA chest.  Of note, in the setting of concomitant tachycardia, We will proceed with Xopenex as preferred beta-2 agonist in order to reduce potential tachycardic influence from crossover beta-blocker on agonism that can be associated with albuterol.  Plan: Scheduled Xopenex/ipratropium nebulizers, as needed Xopenex nebulizer, as above.  Solumedrol.  Check VBG.  Monitor continuous pulse oximetry.  Further evaluation and management suspected severe sepsis due to bilateral pneumonia, broadening of IV antibiotics as detailed below in the setting of evidence of bronchiectasis.  Repeat BMP and CBC in the morning.  Check serum magnesium  and phosphorus levels.        #) Severe sepsis due to bilateral pneumonia: Diagnosis of use of 5 days of progressive shortness of breath associated with subjective fever and new onset cough with CTA chest showing evidence of multifocal bilateral groundglass airspace opacities greatest at the lung bases, potentially representing multifocal pneumonia.  While, in the absence of, any recent hospitalizations to warrant HCAP consideration, will broaden the azithromycin/Rocephin regimen initiated in the ED today to include antipseudomonal cephalosporin given increased risk for Pseudomonas in the setting of the presence of bronchiectasis.  SIRS criteria met via presenting leukocytosis, tachycardia, tachypnea.  Patient's sepsis meets criteria to be considered severe in nature on the basis of evidence of endorgan damage in the form of presenting acute on chronic hypoxic respiratory failure.  Of note, presenting lactate nonelevated at 0.9, while no evidence of hypotension in the ED thus  far. In the absence of lactic acid level that is greater than or equal to 4.0, and in the absence of any associated hypotension, there are no indications for administration of a 30 mL/kg IVF bolus at this time.  No additional evidence of underlying infectious process at this time, including presenting COVID-19/influenza PCR, which were checked today in the ED, and found to be negative. Will also check urinalysis to further evaluate for any additional underlying sources of infection.  Of note, blood cultures x2 were collected in the ED today prior to initiation of Rocephin and IV azithromycin.  Plan: Monitor for results of blood cultures x2 collected in the ED today.  Continue azithromycin for atypical coverage as well as anti-inflammatory benefits in the setting of the presence of Reclast opacities, as above.  Initiate cefepime for provision of antipseudomonal coverage in the context of bronchiectasis, as above.  Repeat CBC with differential in the morning.  Add on procalcitonin level.  Check mycoplasma antibodies as well as strep pneumoniae urine antigen.  Flutter valve incentive serology ordered.  Monitor on telemetry.  Monitor continuous pulse oximetry.  Check urinalysis.       #) Protein calorie malnutrition: Suspected diagnosis in the setting of recent unintentional weight loss, now with BMI less than 19.   Plan: Add on prealbumin level to further quantify degree of severity of this suspected underlying diagnosis.  Of also placed a consult with our inpatient dietitian for assistance with optimization of nutritional status, including for recommendations regarding optimization of home supplementation.         #) Elevated troponin: mildly elevated troponin of 22. Suspect that this mildly elevated troponin is on the basis of supply demand mismatch in the setting of acute on chronic hypoxic respiratory failure as opposed to representing a type I process due to acute plaque rupture. EKG shows no  evidence of acute ischemic changes, including no evidence of STEMI. CTA chest shows no evidence of acute pulmonary embolism or pneumothorax , while showing evidence of groundglass opacities, with potential multifocal pneumonia, as further detailed above. Additionally, presentation is not associated with any CP.  Overall, ACS is felt to be less likely relative to type 2 supply demand mismatch, as above, but will closely monitor on telemetry overnight while treating suspected underlying acute on chronic hypoxic respiratory failure in the setting of acute exacerbation of underlying interstitial lung disease as well as suspected severe sepsis due to pneumonia, as further described above.   Plan: repeat troponin in the AM. Monitor on telemetry. PRN EKG for development of chest pain. Check serum Mg level and repeat BMP  in the morning. repeat CBC in the AM. Additional evaluation and management of presenting acute on chronic hypoxic respiratory failure as well as acute interstitial lung disease exacerbation with suspected severe sepsis due to pneumonia , as suspected driving force behind mildly elevated troponin, as above.       #) Type 2 diabetes mellitus: Not on any insulin as an outpatient.  Rather, he is on metformin as well as glimepiride.  Presenting blood sugar noted to be 181, with increased risk for ensuing development of hyperglycemia in the context of plan for acute systolic corticosteroids as management of acute interstitial lung disease exacerbation, as further detailed above.  Plan: Hold home oral hypoglycemic agents during this hospitalization.  Accu-Cheks before every meal and at bedtime with moderate dose sliding scale insulin.       #) History of pulmonary embolism: The patient reports a history of pulmonary embolism diagnosed during the fall 2020.  Subsequent to this diagnosis, he reports that he has been chronically anticoagulated on Eliquis and that he remains compliant with this  anticoagulant, noting most recent prior dose occurring on the morning of 01/13/2021.  Of note, today CTA chest showed no evidence of acute pulmonary embolism.  Plan: Continue home Eliquis.      DVT prophylaxis: Continue home Eliquis Code Status: Full code Family Communication: Patient's case was discussed with his wife, who is present at bedside Disposition Plan: Per Rounding Team Consults called: none;  Admission status: Inpatient; PCU     Of note, this patient was added by me to the following Admit List/Treatment Team: [facility].    Of note, the Adult Admission Order Set (Multimorbid order set) was used by me in the admission process for this patient.   PLEASE NOTE THAT DRAGON DICTATION SOFTWARE WAS USED IN THE CONSTRUCTION OF THIS NOTE.   [place] Pager [phone] From [place]  Otherwise, please contact night-coverage  [email or web address] Password [facility]   01/13/2021, 6:32 PM

## 2021-01-13 NOTE — ED Notes (Signed)
Patient transported to CT 

## 2021-01-13 NOTE — ED Notes (Signed)
Pt back from CT at this time 

## 2021-01-14 ENCOUNTER — Inpatient Hospital Stay: Payer: BC Managed Care – PPO

## 2021-01-14 ENCOUNTER — Encounter: Payer: Self-pay | Admitting: Internal Medicine

## 2021-01-14 DIAGNOSIS — J849 Interstitial pulmonary disease, unspecified: Secondary | ICD-10-CM | POA: Diagnosis not present

## 2021-01-14 DIAGNOSIS — R7989 Other specified abnormal findings of blood chemistry: Secondary | ICD-10-CM | POA: Diagnosis present

## 2021-01-14 DIAGNOSIS — J9621 Acute and chronic respiratory failure with hypoxia: Secondary | ICD-10-CM | POA: Diagnosis not present

## 2021-01-14 DIAGNOSIS — A419 Sepsis, unspecified organism: Secondary | ICD-10-CM | POA: Diagnosis present

## 2021-01-14 DIAGNOSIS — J189 Pneumonia, unspecified organism: Secondary | ICD-10-CM | POA: Diagnosis not present

## 2021-01-14 DIAGNOSIS — R778 Other specified abnormalities of plasma proteins: Secondary | ICD-10-CM | POA: Diagnosis present

## 2021-01-14 DIAGNOSIS — E46 Unspecified protein-calorie malnutrition: Secondary | ICD-10-CM | POA: Diagnosis present

## 2021-01-14 LAB — CBG MONITORING, ED
Glucose-Capillary: 262 mg/dL — ABNORMAL HIGH (ref 70–99)
Glucose-Capillary: 280 mg/dL — ABNORMAL HIGH (ref 70–99)

## 2021-01-14 LAB — CBC WITH DIFFERENTIAL/PLATELET
Abs Immature Granulocytes: 0.24 10*3/uL — ABNORMAL HIGH (ref 0.00–0.07)
Basophils Absolute: 0 10*3/uL (ref 0.0–0.1)
Basophils Relative: 1 %
Eosinophils Absolute: 0 10*3/uL (ref 0.0–0.5)
Eosinophils Relative: 0 %
HCT: 34.7 % — ABNORMAL LOW (ref 39.0–52.0)
Hemoglobin: 11.2 g/dL — ABNORMAL LOW (ref 13.0–17.0)
Immature Granulocytes: 8 %
Lymphocytes Relative: 7 %
Lymphs Abs: 0.2 10*3/uL — ABNORMAL LOW (ref 0.7–4.0)
MCH: 26.7 pg (ref 26.0–34.0)
MCHC: 32.3 g/dL (ref 30.0–36.0)
MCV: 82.8 fL (ref 80.0–100.0)
Monocytes Absolute: 0.1 10*3/uL (ref 0.1–1.0)
Monocytes Relative: 4 %
Neutro Abs: 2.6 10*3/uL (ref 1.7–7.7)
Neutrophils Relative %: 80 %
Platelets: 320 10*3/uL (ref 150–400)
RBC: 4.19 MIL/uL — ABNORMAL LOW (ref 4.22–5.81)
RDW: 16.5 % — ABNORMAL HIGH (ref 11.5–15.5)
Smear Review: NORMAL
WBC: 3.2 10*3/uL — ABNORMAL LOW (ref 4.0–10.5)
nRBC: 0 % (ref 0.0–0.2)

## 2021-01-14 LAB — SEDIMENTATION RATE: Sed Rate: 68 mm/hr — ABNORMAL HIGH (ref 0–20)

## 2021-01-14 LAB — BLOOD GAS, ARTERIAL
Acid-Base Excess: 3.7 mmol/L — ABNORMAL HIGH (ref 0.0–2.0)
Bicarbonate: 28.5 mmol/L — ABNORMAL HIGH (ref 20.0–28.0)
Delivery systems: POSITIVE
Expiratory PAP: 5
FIO2: 0.5
Inspiratory PAP: 12
O2 Saturation: 97.4 %
Patient temperature: 37
pCO2 arterial: 43 mmHg (ref 32.0–48.0)
pH, Arterial: 7.43 (ref 7.350–7.450)
pO2, Arterial: 93 mmHg (ref 83.0–108.0)

## 2021-01-14 LAB — BLOOD GAS, VENOUS
Acid-base deficit: 3 mmol/L — ABNORMAL HIGH (ref 0.0–2.0)
Bicarbonate: 22 mmol/L (ref 20.0–28.0)
O2 Saturation: 97.5 %
Patient temperature: 37
pCO2, Ven: 38 mmHg — ABNORMAL LOW (ref 44.0–60.0)
pH, Ven: 7.37 (ref 7.250–7.430)
pO2, Ven: 99 mmHg — ABNORMAL HIGH (ref 32.0–45.0)

## 2021-01-14 LAB — COMPREHENSIVE METABOLIC PANEL
ALT: 33 U/L (ref 0–44)
AST: 30 U/L (ref 15–41)
Albumin: 3 g/dL — ABNORMAL LOW (ref 3.5–5.0)
Alkaline Phosphatase: 89 U/L (ref 38–126)
Anion gap: 15 (ref 5–15)
BUN: 19 mg/dL (ref 8–23)
CO2: 23 mmol/L (ref 22–32)
Calcium: 9.1 mg/dL (ref 8.9–10.3)
Chloride: 98 mmol/L (ref 98–111)
Creatinine, Ser: 0.6 mg/dL — ABNORMAL LOW (ref 0.61–1.24)
GFR, Estimated: >60 mL/min (ref >60)
Glucose, Bld: 244 mg/dL — ABNORMAL HIGH (ref 70–99)
Potassium: 4.2 mmol/L (ref 3.5–5.1)
Sodium: 136 mmol/L (ref 135–145)
Total Bilirubin: 1.1 mg/dL (ref 0.3–1.2)
Total Protein: 6.6 g/dL (ref 6.5–8.1)

## 2021-01-14 LAB — PREALBUMIN: Prealbumin: 7.8 mg/dL — ABNORMAL LOW (ref 18–38)

## 2021-01-14 LAB — GLUCOSE, CAPILLARY
Glucose-Capillary: 263 mg/dL — ABNORMAL HIGH (ref 70–99)
Glucose-Capillary: 260 mg/dL — ABNORMAL HIGH (ref 70–99)

## 2021-01-14 LAB — HIV ANTIBODY (ROUTINE TESTING W REFLEX): HIV Screen 4th Generation wRfx: NONREACTIVE

## 2021-01-14 LAB — MAGNESIUM: Magnesium: 2.2 mg/dL (ref 1.7–2.4)

## 2021-01-14 LAB — HEMOGLOBIN A1C
Hgb A1c MFr Bld: 6.6 % — ABNORMAL HIGH (ref 4.8–5.6)
Mean Plasma Glucose: 142.72 mg/dL

## 2021-01-14 LAB — TROPONIN I (HIGH SENSITIVITY): Troponin I (High Sensitivity): 8 ng/L (ref <18)

## 2021-01-14 LAB — PHOSPHORUS: Phosphorus: 4.3 mg/dL (ref 2.5–4.6)

## 2021-01-14 LAB — PROCALCITONIN: Procalcitonin: 0.84 ng/mL

## 2021-01-14 LAB — LACTIC ACID, PLASMA: Lactic Acid, Venous: 1.4 mmol/L (ref 0.5–1.9)

## 2021-01-14 MED ORDER — SODIUM CHLORIDE 0.9% FLUSH
10.0000 mL | INTRAVENOUS | Status: DC | PRN
  Administered 2021-01-24: 10 mL

## 2021-01-14 MED ORDER — INSULIN ASPART 100 UNIT/ML IJ SOLN
0.0000 [IU] | Freq: Every day | INTRAMUSCULAR | Status: DC
  Administered 2021-01-14 – 2021-01-18 (×4): 3 [IU] via SUBCUTANEOUS
  Administered 2021-01-19: 4 [IU] via SUBCUTANEOUS
  Administered 2021-01-23: 2 [IU] via SUBCUTANEOUS
  Administered 2021-01-25 – 2021-01-27 (×2): 3 [IU] via SUBCUTANEOUS
  Administered 2021-01-28: 2 [IU] via SUBCUTANEOUS
  Administered 2021-01-31: 4 [IU] via SUBCUTANEOUS
  Administered 2021-02-05: 3 [IU] via SUBCUTANEOUS
  Administered 2021-02-06 – 2021-02-07 (×2): 2 [IU] via SUBCUTANEOUS
  Administered 2021-02-08: 5 [IU] via SUBCUTANEOUS
  Filled 2021-01-14 (×14): qty 1

## 2021-01-14 MED ORDER — SODIUM CHLORIDE 0.9% FLUSH
10.0000 mL | Freq: Two times a day (BID) | INTRAVENOUS | Status: DC
  Administered 2021-01-14 – 2021-01-16 (×5): 10 mL
  Administered 2021-01-17 – 2021-01-18 (×2): 20 mL
  Administered 2021-01-18 – 2021-01-22 (×9): 10 mL
  Administered 2021-01-23: 20 mL
  Administered 2021-01-23 – 2021-01-24 (×3): 10 mL
  Administered 2021-01-25: 20 mL
  Administered 2021-01-25 – 2021-01-26 (×3): 10 mL
  Administered 2021-01-27: 30 mL
  Administered 2021-01-27: 20 mL
  Administered 2021-01-28 – 2021-02-15 (×34): 10 mL

## 2021-01-14 MED ORDER — INSULIN ASPART 100 UNIT/ML IJ SOLN
0.0000 [IU] | Freq: Three times a day (TID) | INTRAMUSCULAR | Status: DC
  Administered 2021-01-14: 11 [IU] via SUBCUTANEOUS
  Administered 2021-01-15: 15 [IU] via SUBCUTANEOUS
  Administered 2021-01-15 (×2): 11 [IU] via SUBCUTANEOUS
  Administered 2021-01-16: 20 [IU] via SUBCUTANEOUS
  Administered 2021-01-16: 11 [IU] via SUBCUTANEOUS
  Administered 2021-01-16: 4 [IU] via SUBCUTANEOUS
  Administered 2021-01-17: 11 [IU] via SUBCUTANEOUS
  Administered 2021-01-17: 20 [IU] via SUBCUTANEOUS
  Administered 2021-01-17: 7 [IU] via SUBCUTANEOUS
  Administered 2021-01-18: 3 [IU] via SUBCUTANEOUS
  Administered 2021-01-18: 15 [IU] via SUBCUTANEOUS
  Administered 2021-01-18: 3 [IU] via SUBCUTANEOUS
  Administered 2021-01-19: 11 [IU] via SUBCUTANEOUS
  Administered 2021-01-19 (×2): 20 [IU] via SUBCUTANEOUS
  Administered 2021-01-20: 11 [IU] via SUBCUTANEOUS
  Administered 2021-01-20: 15 [IU] via SUBCUTANEOUS
  Administered 2021-01-20: 20 [IU] via SUBCUTANEOUS
  Administered 2021-01-21: 15 [IU] via SUBCUTANEOUS
  Administered 2021-01-21: 4 [IU] via SUBCUTANEOUS
  Administered 2021-01-21: 15 [IU] via SUBCUTANEOUS
  Administered 2021-01-22: 11 [IU] via SUBCUTANEOUS
  Administered 2021-01-22 – 2021-01-23 (×3): 15 [IU] via SUBCUTANEOUS
  Administered 2021-01-23: 4 [IU] via SUBCUTANEOUS
  Administered 2021-01-23 – 2021-01-24 (×2): 20 [IU] via SUBCUTANEOUS
  Administered 2021-01-24: 7 [IU] via SUBCUTANEOUS
  Administered 2021-01-24: 3 [IU] via SUBCUTANEOUS
  Administered 2021-01-25: 20 [IU] via SUBCUTANEOUS
  Administered 2021-01-25: 15 [IU] via SUBCUTANEOUS
  Administered 2021-01-26: 11 [IU] via SUBCUTANEOUS
  Administered 2021-01-26: 7 [IU] via SUBCUTANEOUS
  Administered 2021-01-26: 3 [IU] via SUBCUTANEOUS
  Administered 2021-01-27: 7 [IU] via SUBCUTANEOUS
  Administered 2021-01-27: 4 [IU] via SUBCUTANEOUS
  Administered 2021-01-27: 15 [IU] via SUBCUTANEOUS
  Administered 2021-01-28: 7 [IU] via SUBCUTANEOUS
  Administered 2021-01-28 (×2): 4 [IU] via SUBCUTANEOUS
  Administered 2021-01-29: 7 [IU] via SUBCUTANEOUS
  Administered 2021-01-29 (×2): 11 [IU] via SUBCUTANEOUS
  Administered 2021-01-30: 20 [IU] via SUBCUTANEOUS
  Administered 2021-01-30: 3 [IU] via SUBCUTANEOUS
  Administered 2021-01-31 (×2): 4 [IU] via SUBCUTANEOUS
  Administered 2021-01-31: 7 [IU] via SUBCUTANEOUS
  Administered 2021-02-01 – 2021-02-02 (×3): 3 [IU] via SUBCUTANEOUS
  Administered 2021-02-02: 10 [IU] via SUBCUTANEOUS
  Administered 2021-02-03 (×3): 3 [IU] via SUBCUTANEOUS
  Administered 2021-02-04: 11 [IU] via SUBCUTANEOUS
  Administered 2021-02-04: 3 [IU] via SUBCUTANEOUS
  Administered 2021-02-05 (×2): 7 [IU] via SUBCUTANEOUS
  Administered 2021-02-06: 20 [IU] via SUBCUTANEOUS
  Administered 2021-02-06: 4 [IU] via SUBCUTANEOUS
  Administered 2021-02-07: 7 [IU] via SUBCUTANEOUS
  Administered 2021-02-07 – 2021-02-08 (×2): 11 [IU] via SUBCUTANEOUS
  Administered 2021-02-08: 15 [IU] via SUBCUTANEOUS
  Administered 2021-02-09: 11 [IU] via SUBCUTANEOUS
  Filled 2021-01-14 (×65): qty 1

## 2021-01-14 MED ORDER — BUDESONIDE 0.25 MG/2ML IN SUSP
0.2500 mg | Freq: Two times a day (BID) | RESPIRATORY_TRACT | Status: DC
  Administered 2021-01-14 – 2021-01-17 (×7): 0.25 mg via RESPIRATORY_TRACT
  Filled 2021-01-14 (×7): qty 2

## 2021-01-14 MED ORDER — CHLORHEXIDINE GLUCONATE CLOTH 2 % EX PADS
6.0000 | MEDICATED_PAD | Freq: Every day | CUTANEOUS | Status: DC
Start: 2021-01-14 — End: 2021-02-16
  Administered 2021-01-14 – 2021-02-15 (×26): 6 via TOPICAL

## 2021-01-14 MED ORDER — LEVALBUTEROL HCL 1.25 MG/0.5ML IN NEBU
1.2500 mg | INHALATION_SOLUTION | Freq: Four times a day (QID) | RESPIRATORY_TRACT | Status: DC
  Administered 2021-01-15 (×4): 1.25 mg via RESPIRATORY_TRACT
  Filled 2021-01-14 (×8): qty 0.5

## 2021-01-14 NOTE — Progress Notes (Signed)
Inpatient Diabetes Program Recommendations  AACE/ADA: New Consensus Statement on Inpatient Glycemic Control (2015)  Target Ranges:  Prepandial:   less than 140 mg/dL      Peak postprandial:   less than 180 mg/dL (1-2 hours)      Critically ill patients:  140 - 180 mg/dL   Lab Results  Component Value Date   GLUCAP 262 (H) 01/14/2021   HGBA1C 10.5 (H) 01/30/2019    Review of Glycemic Control Results for Edward Atkinson, Edward Atkinson" (MRN [id number]) as of 01/14/2021 11:10  Ref. Range 01/14/2021 07:54  Glucose-Capillary Latest Ref Range: 70 - 99 mg/dL 262 (H)   Diabetes history: DM 2 Outpatient Diabetes medications: Amaryl 2 mg Daily, Metformin 500 mg tid Current orders for Inpatient glycemic control:  Novolog 0-9 units tid   Solumedrol 125 x1 dose now scheduled 80 mg Q12  Inpatient Diabetes Program Recommendations:    -  Increase Novolog to 0-15 units tid + hs -  Add Semglee 8 units  Thanks,  Tama Headings RN, MSN, BC-ADM Inpatient Diabetes Coordinator Team Pager [phone] (8a-5p)

## 2021-01-14 NOTE — Consult Note (Signed)
Pharmacy Antibiotic Note  Edward Atkinson is a 60 y.o. male admitted on 01/13/2021 with pneumonia and sepsis.  Pharmacy has been consulted for Cefepime dosing.  Patient with PMH of asthma, chronic respiratory failure on 2L O2, prior history lymphoma, and T2DM presenting with worsening shortness of breath and hypoxia. Currently on [place] 14L. Tachypnea, tachycardia, and WBC improved from yesterday. Renal function c/w baseline.  Plan: Continue cefepime 2 gram every 8 hours Continue azithromycin 500 mg every 24 hours Monitor renal function, clinical course, and LOT.  Height: 6' (182.9 cm) Weight: 63.5 kg (140 lb) IBW/kg (Calculated) : 77.6  Temp (24hrs), Avg:98.3 F (36.8 C), Min:98 F (36.7 C), Max:98.6 F (37 C)  Recent Labs  Lab 01/08/21 1005 01/13/21 1333 01/13/21 1909 01/14/21 0702  WBC 7.3 12.3*  --  3.2*  CREATININE 0.59* 0.79  --  0.60*  LATICACIDVEN  --   --  0.9 1.4     Estimated Creatinine Clearance: 86 mL/min (A) (by C-G formula based on SCr of 0.6 mg/dL (L)).    Allergies  Allergen Reactions   Penicillins Rash    Did it involve swelling of the face/tongue/throat, SOB, or low BP? No Did it involve sudden or severe rash/hives, skin peeling, or any reaction on the inside of your mouth or nose? No Did you need to seek medical attention at a hospital or doctor's office? No When did it last happen?      Unknown If all above answers are "NO", may proceed with cephalosporin use.    Antimicrobials this admission: ceftriaxone 9/14 >> 9/24 (x 1 dose) azithromycin 9/14 >>  cefepime 9/14 >>   Microbiology results: 9/14 BCx: NG <12 Hrs 9/15 Sputum: sent 9/15 Aspergillus Ag, Fungus Sputum Cx, Acid Fast Smear: sent    Thank you for allowing pharmacy to be a part of this patient's care.   Wynelle Cleveland, PharmD Pharmacy Resident  01/14/2021 11:54 AM

## 2021-01-14 NOTE — Progress Notes (Signed)
Change in MEWS to Red. BP: 90/53 RR: 74 Morrison NP made aware of the situation. BIPAP ordered. Respiratory Therapist aware and at bedside.

## 2021-01-14 NOTE — Progress Notes (Signed)
CROSS COVER

## 2021-01-14 NOTE — Progress Notes (Signed)
PROGRESS NOTE    Edward Atkinson  RFV:[id number] DOB: 1961-02-25 DOA: 01/13/2021 PCP: Rusty Aus, MD    Brief Narrative:  60 year old male with a history of chronic respiratory failure 2 L of oxygen, interstitial lung disease related to covid pneumonia, prior history of lymphoma, admitted to the hospital with worsening shortness of breath and hypoxia.  Found to have possible pneumonia versus worsening ILD.  Started on antibiotics and steroids.  Pulmonology consulted.    Assessment & Plan:   Principal Problem:   Acute on chronic respiratory failure with hypoxia (HCC) Active Problems:   Type 2 diabetes mellitus without complication, without long-term current use of insulin (HCC)   SOB (shortness of breath)   Interstitial lung disease (HCC)   Severe sepsis (HCC)   CAP (community acquired pneumonia)   Elevated troponin   Protein calorie malnutrition (HCC)   Acute on chronic respiratory failure with hypoxia -Patient is chronically on 2 L of oxygen -Worsening shortness of breath and hypoxia over the past week, noted to have oxygen saturations in the 70s on 2 L -Initially placed on BiPAP, but has subsequently transitioned to high flow nasal cannula -Currently requiring between 10 to 15 L -CTA chest negative for PE, does comment on possible progressive ILD versus pneumonia -He has been started on intravenous antibiotics with cefepime and azithromycin -Discussed with his primary pulmonologist, Dr. Lanney Gins who will see in consult -He has requested that we order ESR, CRP, Fungitell, histoplasmosis antigen, aspergillosis antigen, fungal cultures, AFB and sputum culture. -Patient is also on intravenous steroids, bronchodilators and inhaled steroids -We will continue to wean down oxygen as tolerated  Possible sepsis due to bilateral pneumonia -Hemodynamics have stabilized -Patient presented with leukocytosis, tachycardia and tachypnea -Currently on IV antibiotics -Follow-up  cultures  Mild elevation of troponin -No significant chest pain at this time or EKG changes -Suspect this is demand ischemia in the setting of respiratory illness  Non-insulin-dependent diabetes, uncontrolled with hyperglycemia -Blood sugars elevated in setting of steroids -Oral agents currently on hold -Monitor on sliding scale insulin  History of pulmonary embolism -He is chronically on apixaban. -CT angiogram of chest on admission is negative for pulmonary embolism    DVT prophylaxis: apixaban (ELIQUIS) tablet 2.5 mg Start: 01/13/21 2300 SCDs Start: 01/13/21 1831 apixaban (ELIQUIS) tablet 2.5 mg  Code Status: Full code Family Communication: Discussed with wife at the bedside Disposition Plan: Status is: Inpatient  Remains inpatient appropriate because:Ongoing diagnostic testing needed not appropriate for outpatient work up, IV treatments appropriate due to intensity of illness or inability to take PO, and Inpatient level of care appropriate due to severity of illness  Dispo: The patient is from: Home              Anticipated d/c is to: Home              Patient currently is not medically stable to d/c.   Difficult to place patient No         Consultants:  Pulmonology  Procedures:    Antimicrobials:  Cefepime 9/14 > Azithromycin 9/14 >   Subjective: Patient is sitting up in bed.  Feels that his breathing is better today as compared to yesterday.  He does have productive cough which is worse in the mornings.  Objective: Vitals:   01/14/21 0500 01/14/21 0630 01/14/21 0900 01/14/21 1000  BP: 102/77 102/72 109/71 104/71  Pulse: 91 93 92 94  Resp: 18   18  Temp: 98.6 F (37 C)  TempSrc: Oral     SpO2: 100% 96% 100% 97%  Weight:      Height:        Intake/Output Summary (Last 24 hours) at 01/14/2021 1201 Last data filed at 01/14/2021 0603 Gross per 24 hour  Intake 550 ml  Output --  Net 550 ml   Filed Weights   01/13/21 1335  Weight: 63.5 kg     Examination:  General exam: Appears calm and comfortable  Respiratory system: Clear to auscultation. Respiratory effort normal. Cardiovascular system: S1 & S2 heard, RRR. No JVD, murmurs, rubs, gallops or clicks. No pedal edema. Gastrointestinal system: Abdomen is nondistended, soft and nontender. No organomegaly or masses felt. Normal bowel sounds heard. Central nervous system: Alert and oriented. No focal neurological deficits. Extremities: Symmetric 5 x 5 power. Skin: No rashes, lesions or ulcers Psychiatry: Judgement and insight appear normal. Mood & affect appropriate.     Data Reviewed: I have personally reviewed following labs and imaging studies  CBC: Recent Labs  Lab 01/08/21 1005 01/13/21 1333 01/14/21 0702  WBC 7.3 12.3* 3.2*  NEUTROABS 5.8 9.5* 2.6  HGB 12.4* 13.5 11.2*  HCT 38.6* 41.6 34.7*  MCV 83.7 84.9 82.8  PLT 254 440* 440   Basic Metabolic Panel: Recent Labs  Lab 01/08/21 1005 01/13/21 1333 01/14/21 0702  NA 133* 131* 136  K 4.1 4.3 4.2  CL 97* 92* 98  CO2 '28 23 23  ' GLUCOSE 123* 181* 244*  BUN '17 16 19  ' CREATININE 0.59* 0.79 0.60*  CALCIUM 9.0 9.3 9.1  MG  --  2.3 2.2  PHOS  --   --  4.3   GFR: Estimated Creatinine Clearance: 86 mL/min (A) (by C-G formula based on SCr of 0.6 mg/dL (L)). Liver Function Tests: Recent Labs  Lab 01/08/21 1005 01/13/21 1333 01/14/21 0702  AST 23 50* 30  ALT 23 37 33  ALKPHOS 75 109 89  BILITOT 0.7 0.7 1.1  PROT 7.0 7.2 6.6  ALBUMIN 3.3* 3.1* 3.0*   No results for input(s): LIPASE, AMYLASE in the last 168 hours. No results for input(s): AMMONIA in the last 168 hours. Coagulation Profile: No results for input(s): INR, PROTIME in the last 168 hours. Cardiac Enzymes: No results for input(s): CKTOTAL, CKMB, CKMBINDEX, TROPONINI in the last 168 hours. BNP (last 3 results) No results for input(s): PROBNP in the last 8760 hours. HbA1C: No results for input(s): HGBA1C in the last 72 hours. CBG: Recent  Labs  Lab 01/14/21 0754 01/14/21 1130  GLUCAP 262* 280*   Lipid Profile: No results for input(s): CHOL, HDL, LDLCALC, TRIG, CHOLHDL, LDLDIRECT in the last 72 hours. Thyroid Function Tests: No results for input(s): TSH, T4TOTAL, FREET4, T3FREE, THYROIDAB in the last 72 hours. Anemia Panel: No results for input(s): VITAMINB12, FOLATE, FERRITIN, TIBC, IRON, RETICCTPCT in the last 72 hours. Sepsis Labs: Recent Labs  Lab 01/13/21 1909 01/13/21 1911 01/14/21 0702  PROCALCITON  --  0.96 0.84  LATICACIDVEN 0.9  --  1.4    Recent Results (from the past 240 hour(s))  Resp Panel by RT-PCR (Flu A&B, Covid) Nasopharyngeal Swab     Status: None   Collection Time: 01/13/21  1:33 PM   Specimen: Nasopharyngeal Swab; Nasopharyngeal(NP) swabs in vial transport medium  Result Value Ref Range Status   SARS Coronavirus 2 by RT PCR NEGATIVE NEGATIVE Final    Comment: (NOTE) SARS-CoV-2 target nucleic acids are NOT DETECTED.  The SARS-CoV-2 RNA is generally detectable in upper respiratory specimens during the  acute phase of infection. The lowest concentration of SARS-CoV-2 viral copies this assay can detect is 138 copies/mL. A negative result does not preclude SARS-Cov-2 infection and should not be used as the sole basis for treatment or other patient management decisions. A negative result may occur with  improper specimen collection/handling, submission of specimen other than nasopharyngeal swab, presence of viral mutation(s) within the areas targeted by this assay, and inadequate number of viral copies(<138 copies/mL). A negative result must be combined with clinical observations, patient history, and epidemiological information. The expected result is Negative.  Fact Sheet for Patients:  EntrepreneurPulse.com.au  Fact Sheet for Healthcare Providers:  IncredibleEmployment.be  This test is no t yet approved or cleared by the [place] FDA and  has  been authorized for detection and/or diagnosis of SARS-CoV-2 by FDA under an Emergency Use Authorization (EUA). This EUA will remain  in effect (meaning this test can be used) for the duration of the COVID-19 declaration under Section 564(b)(1) of the Act, 21 U.S.C.section 360bbb-3(b)(1), unless the authorization is terminated  or revoked sooner.       Influenza A by PCR NEGATIVE NEGATIVE Final   Influenza B by PCR NEGATIVE NEGATIVE Final    Comment: (NOTE) The Xpert Xpress SARS-CoV-2/FLU/RSV plus assay is intended as an aid in the diagnosis of influenza from Nasopharyngeal swab specimens and should not be used as a sole basis for treatment. Nasal washings and aspirates are unacceptable for Xpert Xpress SARS-CoV-2/FLU/RSV testing.  Fact Sheet for Patients: EntrepreneurPulse.com.au  Fact Sheet for Healthcare Providers: IncredibleEmployment.be  This test is not yet approved or cleared by the [place] FDA and has been authorized for detection and/or diagnosis of SARS-CoV-2 by FDA under an Emergency Use Authorization (EUA). This EUA will remain in effect (meaning this test can be used) for the duration of the COVID-19 declaration under Section 564(b)(1) of the Act, 21 U.S.C. section 360bbb-3(b)(1), unless the authorization is terminated or revoked.  Performed at [facility], [address] [place], [place] [address]   Blood culture (routine x 2)     Status: None (Preliminary result)   Collection Time: 01/13/21  6:20 PM   Specimen: BLOOD  Result Value Ref Range Status   Specimen Description BLOOD RIGHT ANTECUBITAL  Final   Special Requests   Final    BOTTLES DRAWN AEROBIC AND ANAEROBIC Blood Culture adequate volume   Culture   Final    NO GROWTH < 12 HOURS Performed at [facility], [address] [place], [place] [address]    Report Status PENDING  Incomplete  Blood culture (routine x 2)     Status: None  (Preliminary result)   Collection Time: 01/13/21  6:20 PM   Specimen: BLOOD  Result Value Ref Range Status   Specimen Description BLOOD LEFT ANTECUBITAL  Final   Special Requests   Final    BOTTLES DRAWN AEROBIC AND ANAEROBIC Blood Culture adequate volume   Culture   Final    NO GROWTH < 12 HOURS Performed at [facility], [address] [place], [place] [address]    Report Status PENDING  Incomplete         Radiology Studies: CT Angio Chest PE W/Cm &/Or Wo Cm  Result Date: 01/13/2021 CLINICAL DATA:  Respiratory distress, hypoxia EXAM: CT ANGIOGRAPHY CHEST WITH CONTRAST TECHNIQUE: Multidetector CT imaging of the chest was performed using the standard protocol during bolus administration of intravenous contrast. Multiplanar CT image reconstructions and MIPs were obtained to evaluate  the vascular anatomy. CONTRAST:  53m OMNIPAQUE IOHEXOL 350 MG/ML SOLN COMPARISON:  10/26/2020, 01/13/2021 FINDINGS: Cardiovascular: This is a technically adequate evaluation of the pulmonary vasculature. No filling defects or pulmonary emboli. The heart is unremarkable without pericardial effusion. No evidence of thoracic aortic aneurysm or dissection. Right chest wall port via internal jugular approach tip within the superior vena cava. Mediastinum/Nodes: No pathologic adenopathy within the mediastinum, hila, or axilla. Thyroid, trachea, and esophagus are unremarkable. Lungs/Pleura: There has been interval progression of the multifocal ground-glass airspace disease seen previously. Changes are most pronounced within the dependent lower lobes. No effusion or pneumothorax. The central airways are patent, with persistent bronchiectasis. Upper Abdomen: No acute abnormality. Musculoskeletal: No acute or destructive bony lesions. Reconstructed images demonstrate no additional findings. Review of the MIP images confirms the above findings. IMPRESSION: 1. No evidence of pulmonary embolus. 2. Progressive  multifocal bilateral ground-glass airspace disease, greatest at the lung bases. The differential diagnosis would include progressive postinflammatory scarring and fibrosis, multifocal atypical pneumonia, or hypersensitivity/drug toxicity. 3. Stable bronchiectasis. Electronically Signed   By: MRanda NgoM.D.   On: 01/13/2021 17:13   DG Chest Port 1 View  Result Date: 01/13/2021 CLINICAL DATA:  Shortness of breath EXAM: PORTABLE CHEST 1 VIEW COMPARISON:  Chest radiograph 07/29/2019 FINDINGS: There is a right chest wall port with the tip terminating in the lower SVC/cavoatrial junction. The cardiomediastinal silhouette is within normal limits. Lung volumes are low with unchanged asymmetric elevation of the right hemidiaphragm. There are increased interstitial markings with diffuse reticular opacities throughout both lungs. There is no focal consolidation. There is no significant pleural effusion. There is no pneumothorax. There is no acute osseous abnormality. There is gaseous distention of the stomach and large bowel in the right upper quadrant. IMPRESSION: Low lung volumes with suspected mild pulmonary interstitial edema. Atypical/viral infection could have a similar appearance. Electronically Signed   By: PValetta MoleM.D.   On: 01/13/2021 14:24        Scheduled Meds:  apixaban  2.5 mg Oral BID   budesonide (PULMICORT) nebulizer solution  0.25 mg Nebulization BID   insulin aspart  0-20 Units Subcutaneous TID WC   insulin aspart  0-5 Units Subcutaneous QHS   ipratropium  0.5 mg Nebulization Q6H   levalbuterol  1.25 mg Nebulization Q6H   methylPREDNISolone (SOLU-MEDROL) injection  80 mg Intravenous Q12H   montelukast  10 mg Oral QHS   Continuous Infusions:  azithromycin     ceFEPime (MAXIPIME) IV Stopped (01/14/21 0603)     LOS: 1 day    Time spent: 395ms    JeKathie DikeMD [facility]   If 7PM-7AM, please contact night-coverage [email or web address]  01/14/2021, 12:01 PM

## 2021-01-14 NOTE — ED Notes (Signed)
Pt's oxygen levels dropped to 82% of 5L [place] at this time. Pt placed on 15L non-rebreather and respiratory was called.

## 2021-01-15 DIAGNOSIS — J849 Interstitial pulmonary disease, unspecified: Secondary | ICD-10-CM | POA: Diagnosis not present

## 2021-01-15 DIAGNOSIS — E43 Unspecified severe protein-calorie malnutrition: Secondary | ICD-10-CM | POA: Insufficient documentation

## 2021-01-15 DIAGNOSIS — R778 Other specified abnormalities of plasma proteins: Secondary | ICD-10-CM | POA: Diagnosis not present

## 2021-01-15 DIAGNOSIS — J189 Pneumonia, unspecified organism: Secondary | ICD-10-CM | POA: Diagnosis not present

## 2021-01-15 DIAGNOSIS — J9621 Acute and chronic respiratory failure with hypoxia: Secondary | ICD-10-CM | POA: Diagnosis not present

## 2021-01-15 LAB — CBC
HCT: 32 % — ABNORMAL LOW (ref 39.0–52.0)
Hemoglobin: 10.2 g/dL — ABNORMAL LOW (ref 13.0–17.0)
MCH: 26.3 pg (ref 26.0–34.0)
MCHC: 31.9 g/dL (ref 30.0–36.0)
MCV: 82.5 fL (ref 80.0–100.0)
Platelets: 332 10*3/uL (ref 150–400)
RBC: 3.88 MIL/uL — ABNORMAL LOW (ref 4.22–5.81)
RDW: 16.4 % — ABNORMAL HIGH (ref 11.5–15.5)
WBC: 7 10*3/uL (ref 4.0–10.5)
nRBC: 0 % (ref 0.0–0.2)

## 2021-01-15 LAB — GLUCOSE, CAPILLARY
Glucose-Capillary: 258 mg/dL — ABNORMAL HIGH (ref 70–99)
Glucose-Capillary: 320 mg/dL — ABNORMAL HIGH (ref 70–99)
Glucose-Capillary: 289 mg/dL — ABNORMAL HIGH (ref 70–99)
Glucose-Capillary: 236 mg/dL — ABNORMAL HIGH (ref 70–99)

## 2021-01-15 LAB — RESPIRATORY PANEL BY PCR

## 2021-01-15 LAB — MYCOPLASMA PNEUMONIAE ANTIBODY, IGM: Mycoplasma pneumo IgM: <770 U/mL (ref 0–769)

## 2021-01-15 LAB — BASIC METABOLIC PANEL
Anion gap: 7 (ref 5–15)
BUN: 19 mg/dL (ref 8–23)
CO2: 28 mmol/L (ref 22–32)
Calcium: 8.7 mg/dL — ABNORMAL LOW (ref 8.9–10.3)
Chloride: 99 mmol/L (ref 98–111)
Creatinine, Ser: 0.61 mg/dL (ref 0.61–1.24)
GFR, Estimated: >60 mL/min (ref >60)
Glucose, Bld: 245 mg/dL — ABNORMAL HIGH (ref 70–99)
Potassium: 3.9 mmol/L (ref 3.5–5.1)
Sodium: 134 mmol/L — ABNORMAL LOW (ref 135–145)

## 2021-01-15 LAB — PROCALCITONIN: Procalcitonin: 0.41 ng/mL

## 2021-01-15 LAB — C-REACTIVE PROTEIN: CRP: 9.7 mg/dL — ABNORMAL HIGH (ref <1.0)

## 2021-01-15 LAB — MRSA NEXT GEN BY PCR, NASAL: MRSA by PCR Next Gen: NOT DETECTED

## 2021-01-15 MED ORDER — DIGOXIN 125 MCG PO TABS
0.1250 mg | ORAL_TABLET | Freq: Every day | ORAL | Status: DC
  Administered 2021-01-16 – 2021-02-18 (×34): 0.125 mg via ORAL
  Filled 2021-01-15 (×37): qty 1

## 2021-01-15 MED ORDER — ACYCLOVIR 200 MG PO CAPS
400.0000 mg | ORAL_CAPSULE | Freq: Two times a day (BID) | ORAL | Status: DC
  Administered 2021-01-15 – 2021-02-02 (×36): 400 mg via ORAL
  Filled 2021-01-15 (×40): qty 2

## 2021-01-15 MED ORDER — DIGOXIN 0.25 MG/ML IJ SOLN
0.1250 mg | Freq: Once | INTRAMUSCULAR | Status: AC | PRN
  Administered 2021-01-15: 0.125 mg via INTRAVENOUS
  Filled 2021-01-15: qty 2

## 2021-01-15 MED ORDER — ACYCLOVIR 400 MG PO TABS
400.0000 mg | ORAL_TABLET | Freq: Two times a day (BID) | ORAL | Status: DC
  Filled 2021-01-15 (×2): qty 1

## 2021-01-15 MED ORDER — INSULIN GLARGINE-YFGN 100 UNIT/ML ~~LOC~~ SOLN
10.0000 [IU] | Freq: Every day | SUBCUTANEOUS | Status: DC
  Administered 2021-01-15 – 2021-01-16 (×2): 10 [IU] via SUBCUTANEOUS
  Filled 2021-01-15 (×2): qty 0.1

## 2021-01-15 MED ORDER — ADULT MULTIVITAMIN W/MINERALS CH
1.0000 | ORAL_TABLET | Freq: Every day | ORAL | Status: DC
  Administered 2021-01-16 – 2021-02-19 (×34): 1 via ORAL
  Filled 2021-01-15 (×34): qty 1

## 2021-01-15 MED ORDER — DIGOXIN 0.25 MG/ML IJ SOLN
0.2500 mg | Freq: Once | INTRAMUSCULAR | Status: AC
  Administered 2021-01-15: 0.25 mg via INTRAVENOUS
  Filled 2021-01-15: qty 2

## 2021-01-15 MED ORDER — METHYLPREDNISOLONE SODIUM SUCC 40 MG IJ SOLR
40.0000 mg | Freq: Two times a day (BID) | INTRAMUSCULAR | Status: DC
  Administered 2021-01-15 – 2021-01-16 (×3): 40 mg via INTRAVENOUS
  Filled 2021-01-15 (×3): qty 1

## 2021-01-15 MED ORDER — ACYCLOVIR 400 MG PO TABS: 400.0000 mg | ORAL_TABLET | Freq: Two times a day (BID) | ORAL | Status: DC

## 2021-01-15 MED ORDER — NEPRO/CARBSTEADY PO LIQD
237.0000 mL | Freq: Three times a day (TID) | ORAL | Status: DC
  Administered 2021-01-15 – 2021-02-19 (×78): 237 mL via ORAL

## 2021-01-15 NOTE — Consult Note (Signed)
Pharmacy Antibiotic Note  Edward Atkinson is a 60 y.o. male admitted on 01/13/2021 with pneumonia and sepsis.  Pharmacy has been consulted for Cefepime dosing.  Patient with PMH of asthma, chronic respiratory failure on 2L O2, prior history lymphoma, and T2DM presenting with worsening shortness of breath and hypoxia. Currently on [place] 12L (down from HFNC 10L). Tachypnea and tachycardia improved from yesterday. Renal function stable (Scr 0.59>0.79>0.60>0.61)  Plan: Cefepime Continue cefepime 2 gram every 8 hours Azithromycin Continue azithromycin 500 mg every 24 hours Continue to monitor renal function, clinical course, and LOT.  Height: 6' (182.9 cm) Weight: 67.8 kg (149 lb 8 oz) IBW/kg (Calculated) : 77.6  Temp (24hrs), Avg:97.7 F (36.5 C), Min:97.5 F (36.4 C), Max:97.8 F (36.6 C)  Recent Labs  Lab 01/13/21 1333 01/13/21 1909 01/14/21 0702 01/15/21 0440  WBC 12.3*  --  3.2* 7.0  CREATININE 0.79  --  0.60* 0.61  LATICACIDVEN  --  0.9 1.4  --      Estimated Creatinine Clearance: 91.8 mL/min (by C-G formula based on SCr of 0.61 mg/dL).    Allergies  Allergen Reactions   Penicillins Rash    Did it involve swelling of the face/tongue/throat, SOB, or low BP? No Did it involve sudden or severe rash/hives, skin peeling, or any reaction on the inside of your mouth or nose? No Did you need to seek medical attention at a hospital or doctor's office? No When did it last happen?      Unknown If all above answers are "NO", may proceed with cephalosporin use.    Antimicrobials this admission: ceftriaxone 9/14 >> 9/24 (x 1 dose) azithromycin 9/14 >>  cefepime 9/14 >>   Microbiology results: 9/14 BCx: NG x2 days 9/15 Sputum: sent 9/15 Aspergillus Ag, Fungus Sputum Cx, Acid Fast Smear: sent  Thank you for allowing pharmacy to be a part of this patient's care.   Narda Rutherford, PharmD Pharmacy Resident  01/15/2021 11:47 AM

## 2021-01-15 NOTE — Consult Note (Signed)
Pulmonary Medicine          Date: 01/15/2021,   MRN# [id number] Edward Atkinson January 13, 1961     AdmissionWeight: 63.5 kg                 CurrentWeight: 67.8 kg   Referring physician: Dr Roderic Palau   CHIEF COMPLAINT:   Acute on chronic hypoxemic respiratory failure   HISTORY OF PRESENT ILLNESS   60 yo M w/hx of chronic hypoxemia and ILD post COVID19, T2DM, PE on eliquis, Lymphoma s/p Rituxan which is completed.  Came in due to worsening SOB and DOE specifically mild exertional defacation/urination with feelings of presyncope and severe dyspnea which started to get worse post tapering from steroid appx few days prior to onset of symptom progression.  He was placed on solumedrol and reported mild improvement.  He is on 10L /min [place] during my evaluation.  He had CT chest done with PE protocol and noted to have bilateral GGO infiltrates worse compared to June study. PCCM consultation for further evaluation and management.    PAST MEDICAL HISTORY   Past Medical History:  Diagnosis Date  . Chronic respiratory failure with hypoxia (HCC)    2L [place] continuous  . COVID-19   . Diabetes mellitus without complication ([place])   . Diverticulitis   . Follicular lymphoma of intra-abdominal lymph nodes ([place]) 02/15/2019  . ILD (interstitial lung disease) ([place])      SURGICAL HISTORY   Past Surgical History:  Procedure Laterality Date  . CHOLECYSTECTOMY    . COLON RESECTION     DUE TO DIVERTICULITIS  . FLEXIBLE BRONCHOSCOPY N/A 12/02/2019   Procedure: FLEXIBLE BRONCHOSCOPY;  Surgeon: Ottie Glazier, MD;  Location: [facility];  Service: Thoracic;  Laterality: N/A;  . PORTA CATH INSERTION N/A 03/11/2019   Procedure: PORTA CATH INSERTION;  Surgeon: Algernon Huxley, MD;  Location: [facility];  Service: Cardiovascular;  Laterality: N/A;  . PULMONARY THROMBECTOMY N/A 01/29/2019   Procedure: PULMONARY THROMBECTOMY;  Surgeon: Algernon Huxley, MD;  Location: [facility];  Service:  Cardiovascular;  Laterality: N/A;     FAMILY HISTORY   Family History  Problem Relation Age of Onset  . Diabetes Brother      SOCIAL HISTORY   Social History   Tobacco Use  . Smoking status: Never  . Smokeless tobacco: Never  Vaping Use  . Vaping Use: Never used  Substance Use Topics  . Alcohol use: Yes    Alcohol/week: 0.0 - 2.0 standard drinks    Comment: occasional  . Drug use: Never     MEDICATIONS    Home Medication:    Current Medication:  Current Facility-Administered Medications:  .  acetaminophen (TYLENOL) tablet 650 mg, 650 mg, Oral, Q6H PRN **OR** acetaminophen (TYLENOL) suppository 650 mg, 650 mg, Rectal, Q6H PRN, Howerter, Justin B, DO .  apixaban (ELIQUIS) tablet 2.5 mg, 2.5 mg, Oral, BID, Howerter, Justin B, DO, 2.5 mg at 01/15/21 0821 .  azithromycin (ZITHROMAX) 500 mg in sodium chloride 0.9 % 250 mL IVPB, 500 mg, Intravenous, Q24H, Howerter, Justin B, DO, Last Rate: 250 mL/hr at 01/14/21 1848, 500 mg at 01/14/21 1848 .  budesonide (PULMICORT) nebulizer solution 0.25 mg, 0.25 mg, Nebulization, BID, Memon, Jehanzeb, MD, 0.25 mg at 01/15/21 0735 .  ceFEPIme (MAXIPIME) 2 g in sodium chloride 0.9 % 100 mL IVPB, 2 g, Intravenous, Q8H, Dorothe Pea, RPH, Last Rate: 200 mL/hr at 01/15/21 0557, 2 g at 01/15/21 0557 .  Chlorhexidine Gluconate Cloth 2 % PADS 6 each, 6 each, Topical, Daily, Kathie Dike, MD, 6 each at 01/15/21 0840 .  insulin aspart (novoLOG) injection 0-20 Units, 0-20 Units, Subcutaneous, TID WC, Kathie Dike, MD, 15 Units at 01/15/21 1233 .  insulin aspart (novoLOG) injection 0-5 Units, 0-5 Units, Subcutaneous, QHS, Kathie Dike, MD, 3 Units at 01/14/21 2243 .  insulin glargine-yfgn (SEMGLEE) injection 10 Units, 10 Units, Subcutaneous, Daily, Kathie Dike, MD, 10 Units at 01/15/21 1233 .  ipratropium (ATROVENT) nebulizer solution 0.5 mg, 0.5 mg, Nebulization, Q6H, Howerter, Justin B, DO, 0.5 mg at 01/15/21 1323 .  levalbuterol  (XOPENEX) nebulizer solution 1.25 mg, 1.25 mg, Nebulization, Q4H PRN, Howerter, Justin B, DO .  levalbuterol (XOPENEX) nebulizer solution 1.25 mg, 1.25 mg, Nebulization, Q6H, Howerter, Justin B, DO, 1.25 mg at 01/15/21 1323 .  methylPREDNISolone sodium succinate (SOLU-MEDROL) 125 mg/2 mL injection 80 mg, 80 mg, Intravenous, Q12H, Howerter, Justin B, DO, 80 mg at 01/15/21 0821 .  montelukast (SINGULAIR) tablet 10 mg, 10 mg, Oral, QHS, Howerter, Justin B, DO, 10 mg at 01/14/21 2227 .  sodium chloride flush (NS) 0.9 % injection 10-40 mL, 10-40 mL, Intracatheter, Q12H, Memon, Jolaine Artist, MD, 10 mL at 01/15/21 1234 .  sodium chloride flush (NS) 0.9 % injection 10-40 mL, 10-40 mL, Intracatheter, PRN, Kathie Dike, MD  Facility-Administered Medications Ordered in Other Encounters:  .  heparin lock flush 100 unit/mL, 500 Units, Intravenous, Once, Earlie Server, MD    ALLERGIES   Penicillins     REVIEW OF SYSTEMS    Review of Systems:  Gen:  Denies  fever, sweats, chills weigh loss  HEENT: Denies blurred vision, double vision, ear pain, eye pain, hearing loss, nose bleeds, sore throat Cardiac:  No dizziness, chest pain or heaviness, chest tightness,edema Resp:   Denies cough or sputum porduction, shortness of breath,wheezing, hemoptysis,  Gi: Denies swallowing difficulty, stomach pain, nausea or vomiting, diarrhea, constipation, bowel incontinence Gu:  Denies bladder incontinence, burning urine Ext:   Denies Joint pain, stiffness or swelling Skin: Denies  skin rash, easy bruising or bleeding or hives Endoc:  Denies polyuria, polydipsia , polyphagia or weight change Psych:   Denies depression, insomnia or hallucinations   Other:  All other systems negative   VS: BP 128/68 (BP Location: Right Arm)   Pulse 82   Temp 97.7 F (36.5 C) (Oral)   Resp (!) 24   Ht 6' (1.829 m)   Wt 67.8 kg   SpO2 97%   BMI 20.28 kg/m      PHYSICAL EXAM    GENERAL:NAD, no fevers, chills, no weakness no  fatigue HEAD: Normocephalic, atraumatic.  EYES: Pupils equal, round, reactive to light. Extraocular muscles intact. No scleral icterus.  MOUTH: Moist mucosal membrane. Dentition intact. No abscess noted.  EAR, NOSE, THROAT: Clear without exudates. No external lesions.  NECK: Supple. No thyromegaly. No nodules. No JVD.  PULMONARY: Bilateral rhonchi CARDIOVASCULAR: S1 and S2. Regular rate and rhythm. No murmurs, rubs, or gallops. No edema. Pedal pulses 2+ bilaterally.  GASTROINTESTINAL: Soft, nontender, nondistended. No masses. Positive bowel sounds. No hepatosplenomegaly.  MUSCULOSKELETAL: No swelling, clubbing, or edema. Range of motion full in all extremities.  NEUROLOGIC: Cranial nerves II through XII are intact. No gross focal neurological deficits. Sensation intact. Reflexes intact.  SKIN: No ulceration, lesions, rashes, or cyanosis. Skin warm and dry. Turgor intact.  PSYCHIATRIC: Mood, affect within normal limits. The patient is awake, alert and oriented x 3. Insight, judgment intact.  IMAGING    CT Angio Chest PE W/Cm &/Or Wo Cm  Result Date: 01/13/2021 CLINICAL DATA:  Respiratory distress, hypoxia EXAM: CT ANGIOGRAPHY CHEST WITH CONTRAST TECHNIQUE: Multidetector CT imaging of the chest was performed using the standard protocol during bolus administration of intravenous contrast. Multiplanar CT image reconstructions and MIPs were obtained to evaluate the vascular anatomy. CONTRAST:  58m OMNIPAQUE IOHEXOL 350 MG/ML SOLN COMPARISON:  10/26/2020, 01/13/2021 FINDINGS: Cardiovascular: This is a technically adequate evaluation of the pulmonary vasculature. No filling defects or pulmonary emboli. The heart is unremarkable without pericardial effusion. No evidence of thoracic aortic aneurysm or dissection. Right chest wall port via internal jugular approach tip within the superior vena cava. Mediastinum/Nodes: No pathologic adenopathy within the mediastinum, hila, or axilla. Thyroid,  trachea, and esophagus are unremarkable. Lungs/Pleura: There has been interval progression of the multifocal ground-glass airspace disease seen previously. Changes are most pronounced within the dependent lower lobes. No effusion or pneumothorax. The central airways are patent, with persistent bronchiectasis. Upper Abdomen: No acute abnormality. Musculoskeletal: No acute or destructive bony lesions. Reconstructed images demonstrate no additional findings. Review of the MIP images confirms the above findings. IMPRESSION: 1. No evidence of pulmonary embolus. 2. Progressive multifocal bilateral ground-glass airspace disease, greatest at the lung bases. The differential diagnosis would include progressive postinflammatory scarring and fibrosis, multifocal atypical pneumonia, or hypersensitivity/drug toxicity. 3. Stable bronchiectasis. Electronically Signed   By: MRanda NgoM.D.   On: 01/13/2021 17:13   DG Chest Port 1 View  Result Date: 01/14/2021 CLINICAL DATA:  Shortness of breath.  History of cancer.  Diabetes. EXAM: PORTABLE CHEST 1 VIEW.  Patient is rotated. COMPARISON:  Chest x-ray 01/13/2021, CT chest 01/13/2021 FINDINGS: Accessed right chest wall Port-A-Cath in stable position. The heart and mediastinal contours are within normal limits. Low lung volumes with persistent diffuse patchy airspace opacities. No pleural effusion. No pneumothorax. No acute osseous abnormality. Right upper quadrant surgical clips. IMPRESSION: Low lung volumes with persistent diffuse patchy airspace opacities. Electronically Signed   By: MIven FinnM.D.   On: 01/14/2021 21:08   DG Chest Port 1 View  Result Date: 01/13/2021 CLINICAL DATA:  Shortness of breath EXAM: PORTABLE CHEST 1 VIEW COMPARISON:  Chest radiograph 07/29/2019 FINDINGS: There is a right chest wall port with the tip terminating in the lower SVC/cavoatrial junction. The cardiomediastinal silhouette is within normal limits. Lung volumes are low with  unchanged asymmetric elevation of the right hemidiaphragm. There are increased interstitial markings with diffuse reticular opacities throughout both lungs. There is no focal consolidation. There is no significant pleural effusion. There is no pneumothorax. There is no acute osseous abnormality. There is gaseous distention of the stomach and large bowel in the right upper quadrant. IMPRESSION: Low lung volumes with suspected mild pulmonary interstitial edema. Atypical/viral infection could have a similar appearance. Electronically Signed   By: PValetta MoleM.D.   On: 01/13/2021 14:24      ASSESSMENT/PLAN   Acute hypoxemic respiratory failure - present on admission  - COVID19 negative   - supplemental O2 during my evaluation 10l/min -Respiratory viral panel -serum fungitell -legionella ab -nasal MRSA PCR -Procalcitonin trend reviewed -strep pneumoniae ur AG -Histoplasma Ur Ag -sputum resp cultures -AFB sputum expectorated specimen -sputum cytology  -Agree with Cefepime empirically  -Reduced Solumedrol to 40 BID - 01/15/21 -reviewed pertinent imaging with patient today - ESR and CRP are elevated  -PT/OT for d/c planning  -please encourage patient to use incentive spirometer few times each hour while hospitalized.  Acute blood loss anemia    - noted Hb trending down from 14 to 10 -    -fecal occult blood test    - may be due to GI bleed due to eliquis - monitor for grossly visible bleeding and stable vital sings    Thank you for allowing me to participate in the care of this patient.  Total face to face encounter time for this patient visit was >49mn. >50% of the time was  spent in counseling and coordination of care.   Patient/Family are satisfied with care plan and all questions have been answered.  This document was prepared using Dragon voice recognition software and may include unintentional dictation errors.     FOttie Glazier M.D.  Division of [facility]

## 2021-01-15 NOTE — Progress Notes (Signed)
Patient taken off bipap during nebulizer treatment.  Patient stated he feels better off the bipap.  Patient placed on 15LPM HFNC.  SPO2: 94%.  Patient appears to be tolerating HFNC well.  Nurse aware of patient being taken off bipap.

## 2021-01-15 NOTE — Progress Notes (Signed)
Initial Nutrition Assessment  DOCUMENTATION CODES:   Severe malnutrition in context of chronic illness  INTERVENTION:   Nepro Shake po TID, each supplement provides 425 kcal and 19 grams protein  MVI po daily   Pt at high refeed risk; recommend monitor potassium, magnesium and phosphorus labs daily until stable  NUTRITION DIAGNOSIS:   Severe Malnutrition related to chronic illness (lymphoma, ILD) as evidenced by severe fat depletion, severe muscle depletion, 16 percent weight loss in <3 months.  GOAL:   Patient will meet greater than or equal to 90% of their needs  MONITOR:   PO intake, Supplement acceptance, Labs, Weight trends, Skin, I & O's  REASON FOR ASSESSMENT:   Consult Poor PO  ASSESSMENT:   60 y/o male with h/o ILD, COVID19, T2DM, PE on eliquis and lymphoma s/p Rituxan which is completed who is admitted with PNA and sepsis.  Met with pt in room today. Pt reports poor appetite and oral intake along with an ~30lb weight loss since June. Pt reports that he just has no desire to eat r/t taste changes and nausea at times. Pt reports that he feels that is he over eats, that he will get sick. Pt has been drinking some supplements at home and that this is mainly what he has been living off of. Pt does report that his appetite is improving now in hospital and that he is feeling much better. Pt documented to have eaten 100% of his breakfast today and 75% of his lunch. RD will add supplements and MVI to help pt meet his estimated needs. Pt is likely at high refeed risk. Per chart, pt is down 28lbs(16%) in < 3 months; this is severe weight loss.   Medications reviewed and include: insulin, solu-medrol, azithromycin, cefepime   Labs reviewed: Na 134(L) Hgb 10.2(L), Hct 32.0(L) Cbgs- 258, 320 x 24 hrs AIC 6.6(H)- 9/15  NUTRITION - FOCUSED PHYSICAL EXAM:  Flowsheet Row Most Recent Value  Orbital Region Moderate depletion  Upper Arm Region Moderate depletion  Thoracic and  Lumbar Region Severe depletion  Buccal Region Mild depletion  Temple Region Moderate depletion  Clavicle Bone Region Severe depletion  Clavicle and Acromion Bone Region Severe depletion  Scapular Bone Region Severe depletion  Dorsal Hand Moderate depletion  Patellar Region Severe depletion  Anterior Thigh Region Severe depletion  Posterior Calf Region Severe depletion  Edema (RD Assessment) None  Hair Reviewed  Eyes Reviewed  Mouth Reviewed  Skin Reviewed  Nails Reviewed   Diet Order:   Diet Order             Diet Carb Modified Fluid consistency: Thin; Room service appropriate? Yes  Diet effective now                  EDUCATION NEEDS:   Education needs have been addressed  Skin:  Skin Assessment: Reviewed RN Assessment  Last BM:  pta  Height:   Ht Readings from Last 1 Encounters:  01/14/21 6' (1.829 m)    Weight:   Wt Readings from Last 1 Encounters:  01/15/21 67.8 kg    Ideal Body Weight:  80.9 kg  BMI:  Body mass index is 20.28 kg/m.  Estimated Nutritional Needs:   Kcal:  2200-2500kcal/day  Protein:  110-125g/day  Fluid:  2.0-2.3L/day  Koleen Distance MS, RD, LDN Please refer to [facility] for RD and/or RD on-call/weekend/after hours pager

## 2021-01-15 NOTE — Progress Notes (Signed)
PROGRESS NOTE    Edward Atkinson  QAS:[id number] DOB: 04-23-1961 DOA: 01/13/2021 PCP: Rusty Aus, MD    Brief Narrative:  60 year old male with a history of chronic respiratory failure 2 L of oxygen, interstitial lung disease related to covid pneumonia, prior history of lymphoma, admitted to the hospital with worsening shortness of breath and hypoxia.  Found to have possible pneumonia versus worsening ILD.  Started on antibiotics and steroids.  Pulmonology consulted.    Assessment & Plan:   Principal Problem:   Acute on chronic respiratory failure with hypoxia (HCC) Active Problems:   Type 2 diabetes mellitus without complication, without long-term current use of insulin (HCC)   SOB (shortness of breath)   Interstitial lung disease (HCC)   Severe sepsis (HCC)   CAP (community acquired pneumonia)   Elevated troponin   Protein calorie malnutrition (HCC)   Protein-calorie malnutrition, severe   Acute on chronic respiratory failure with hypoxia -Patient is chronically on 2 L of oxygen -Worsening shortness of breath and hypoxia over the past week, noted to have oxygen saturations in the 70s on 2 L -Initially placed on BiPAP, but has subsequently transitioned to high flow nasal cannula -Currently requiring between 10 to 15 L -CTA chest negative for PE, does comment on possible progressive ILD versus pneumonia -He has been started on intravenous antibiotics with cefepime and azithromycin -Pulmonology following, appreciate assistance -He has requested that we order ESR, CRP, Fungitell, histoplasmosis antigen, aspergillosis antigen, fungal cultures, AFB and sputum culture. -Patient is also on intravenous steroids, bronchodilators and inhaled steroids -We will continue to wean down oxygen as tolerated  Possible sepsis due to bilateral pneumonia -Patient presented with leukocytosis, tachycardia and tachypnea -Currently on IV antibiotics -Follow-up cultures  Rapid atrial  fibrillation -Patient with heart rate in the 140s, blood pressure high 90s to low 100s -Borderline blood pressures limit the use of beta-blocker/calcium channel blockers -Amiodarone probably not the best choice for this patient with his known interstitial lung disease -Discussed with Dr. Saralyn Pilar who will see in consultation -Due to soft blood pressures, he will be started on digoxin -Since patient is not particularly symptomatic, he should be able to tolerate a mildly elevated blood pressure. -Patient is already on chronic anticoagulation with apixaban  Anemia -No obvious blood loss -Continue to follow hemoglobin -Stool FOBT has been ordered  Mild elevation of troponin -No significant chest pain at this time or EKG changes -Suspect this is demand ischemia in the setting of respiratory illness  Non-insulin-dependent diabetes, uncontrolled with hyperglycemia -Blood sugars elevated in setting of steroids -Oral agents currently on hold -Monitor on sliding scale insulin -Started on basal insulin  History of pulmonary embolism -He is chronically on apixaban. -CT angiogram of chest on admission is negative for pulmonary embolism    DVT prophylaxis: apixaban (ELIQUIS) tablet 2.5 mg Start: 01/13/21 2300 SCDs Start: 01/13/21 1831 apixaban (ELIQUIS) tablet 2.5 mg  Code Status: Full code Family Communication: Discussed with wife at the bedside Disposition Plan: Status is: Inpatient  Remains inpatient appropriate because:Ongoing diagnostic testing needed not appropriate for outpatient work up, IV treatments appropriate due to intensity of illness or inability to take PO, and Inpatient level of care appropriate due to severity of illness  Dispo: The patient is from: Home              Anticipated d/c is to: Home              Patient currently is not medically stable to d/c.  Difficult to place patient No         Consultants:  Pulmonology Cardiology  Procedures:     Antimicrobials:  Cefepime 9/14 > Azithromycin 9/14 >   Subjective: Overall he feels that his breathing is better today.  Does not feel short of breath.  Denies any palpitations.  He is noted to be in rapid atrial fibrillation by staff with heart rate sustaining in the 140s.  Objective: Vitals:   01/15/21 0734 01/15/21 1052 01/15/21 1327 01/15/21 1500  BP:  128/68  102/80  Pulse:  82  (!) 102  Resp:  (!) 24  20  Temp:  97.7 F (36.5 C)  98 F (36.7 C)  TempSrc:  Oral  Oral  SpO2: 100% 100% 97% 98%  Weight:      Height:        Intake/Output Summary (Last 24 hours) at 01/15/2021 1936 Last data filed at 01/15/2021 1629 Gross per 24 hour  Intake 1210.34 ml  Output 800 ml  Net 410.34 ml   Filed Weights   01/13/21 1335 01/14/21 1629 01/15/21 0327  Weight: 63.5 kg 60.1 kg 67.8 kg    Examination:  General exam: Appears calm and comfortable  Respiratory system: Clear to auscultation. Respiratory effort normal. Cardiovascular system: Irregularly irregular. No JVD, murmurs, rubs, gallops or clicks. No pedal edema. Gastrointestinal system: Abdomen is nondistended, soft and nontender. No organomegaly or masses felt. Normal bowel sounds heard. Central nervous system: Alert and oriented. No focal neurological deficits. Extremities: Symmetric 5 x 5 power. Skin: No rashes, lesions or ulcers Psychiatry: Judgement and insight appear normal. Mood & affect appropriate.     Data Reviewed: I have personally reviewed following labs and imaging studies  CBC: Recent Labs  Lab 01/13/21 1333 01/14/21 0702 01/15/21 0440  WBC 12.3* 3.2* 7.0  NEUTROABS 9.5* 2.6  --   HGB 13.5 11.2* 10.2*  HCT 41.6 34.7* 32.0*  MCV 84.9 82.8 82.5  PLT 440* 320 388   Basic Metabolic Panel: Recent Labs  Lab 01/13/21 1333 01/14/21 0702 01/15/21 0440  NA 131* 136 134*  K 4.3 4.2 3.9  CL 92* 98 99  CO2 '23 23 28  ' GLUCOSE 181* 244* 245*  BUN '16 19 19  ' CREATININE 0.79 0.60* 0.61  CALCIUM 9.3  9.1 8.7*  MG 2.3 2.2  --   PHOS  --  4.3  --    GFR: Estimated Creatinine Clearance: 91.8 mL/min (by C-G formula based on SCr of 0.61 mg/dL). Liver Function Tests: Recent Labs  Lab 01/13/21 1333 01/14/21 0702  AST 50* 30  ALT 37 33  ALKPHOS 109 89  BILITOT 0.7 1.1  PROT 7.2 6.6  ALBUMIN 3.1* 3.0*   No results for input(s): LIPASE, AMYLASE in the last 168 hours. No results for input(s): AMMONIA in the last 168 hours. Coagulation Profile: No results for input(s): INR, PROTIME in the last 168 hours. Cardiac Enzymes: No results for input(s): CKTOTAL, CKMB, CKMBINDEX, TROPONINI in the last 168 hours. BNP (last 3 results) No results for input(s): PROBNP in the last 8760 hours. HbA1C: Recent Labs    01/14/21 1621  HGBA1C 6.6*   CBG: Recent Labs  Lab 01/14/21 1631 01/14/21 2129 01/15/21 0713 01/15/21 1119 01/15/21 1632  GLUCAP 263* 260* 258* 320* 289*   Lipid Profile: No results for input(s): CHOL, HDL, LDLCALC, TRIG, CHOLHDL, LDLDIRECT in the last 72 hours. Thyroid Function Tests: No results for input(s): TSH, T4TOTAL, FREET4, T3FREE, THYROIDAB in the last 72 hours. Anemia Panel: No  results for input(s): VITAMINB12, FOLATE, FERRITIN, TIBC, IRON, RETICCTPCT in the last 72 hours. Sepsis Labs: Recent Labs  Lab 01/13/21 1909 01/13/21 1911 01/14/21 0702 01/15/21 0440  PROCALCITON  --  0.96 0.84 0.41  LATICACIDVEN 0.9  --  1.4  --     Recent Results (from the past 240 hour(s))  Resp Panel by RT-PCR (Flu A&B, Covid) Nasopharyngeal Swab     Status: None   Collection Time: 01/13/21  1:33 PM   Specimen: Nasopharyngeal Swab; Nasopharyngeal(NP) swabs in vial transport medium  Result Value Ref Range Status   SARS Coronavirus 2 by RT PCR NEGATIVE NEGATIVE Final    Comment: (NOTE) SARS-CoV-2 target nucleic acids are NOT DETECTED.  The SARS-CoV-2 RNA is generally detectable in upper respiratory specimens during the acute phase of infection. The lowest concentration of  SARS-CoV-2 viral copies this assay can detect is 138 copies/mL. A negative result does not preclude SARS-Cov-2 infection and should not be used as the sole basis for treatment or other patient management decisions. A negative result may occur with  improper specimen collection/handling, submission of specimen other than nasopharyngeal swab, presence of viral mutation(s) within the areas targeted by this assay, and inadequate number of viral copies(<138 copies/mL). A negative result must be combined with clinical observations, patient history, and epidemiological information. The expected result is Negative.  Fact Sheet for Patients:  EntrepreneurPulse.com.au  Fact Sheet for Healthcare Providers:  IncredibleEmployment.be  This test is no t yet approved or cleared by the [place] FDA and  has been authorized for detection and/or diagnosis of SARS-CoV-2 by FDA under an Emergency Use Authorization (EUA). This EUA will remain  in effect (meaning this test can be used) for the duration of the COVID-19 declaration under Section 564(b)(1) of the Act, 21 U.S.C.section 360bbb-3(b)(1), unless the authorization is terminated  or revoked sooner.       Influenza A by PCR NEGATIVE NEGATIVE Final   Influenza B by PCR NEGATIVE NEGATIVE Final    Comment: (NOTE) The Xpert Xpress SARS-CoV-2/FLU/RSV plus assay is intended as an aid in the diagnosis of influenza from Nasopharyngeal swab specimens and should not be used as a sole basis for treatment. Nasal washings and aspirates are unacceptable for Xpert Xpress SARS-CoV-2/FLU/RSV testing.  Fact Sheet for Patients: EntrepreneurPulse.com.au  Fact Sheet for Healthcare Providers: IncredibleEmployment.be  This test is not yet approved or cleared by the [place] FDA and has been authorized for detection and/or diagnosis of SARS-CoV-2 by FDA under an Emergency Use  Authorization (EUA). This EUA will remain in effect (meaning this test can be used) for the duration of the COVID-19 declaration under Section 564(b)(1) of the Act, 21 U.S.C. section 360bbb-3(b)(1), unless the authorization is terminated or revoked.  Performed at [facility], [address] [place], [place] [address]   Blood culture (routine x 2)     Status: None (Preliminary result)   Collection Time: 01/13/21  6:20 PM   Specimen: BLOOD  Result Value Ref Range Status   Specimen Description BLOOD RIGHT ANTECUBITAL  Final   Special Requests   Final    BOTTLES DRAWN AEROBIC AND ANAEROBIC Blood Culture adequate volume   Culture   Final    NO GROWTH 2 DAYS Performed at [facility], [address] [place], [place] [address]    Report Status PENDING  Incomplete  Blood culture (routine x 2)     Status: None (Preliminary result)   Collection Time: 01/13/21  6:20 PM   Specimen:  BLOOD  Result Value Ref Range Status   Specimen Description BLOOD LEFT ANTECUBITAL  Final   Special Requests   Final    BOTTLES DRAWN AEROBIC AND ANAEROBIC Blood Culture adequate volume   Culture   Final    NO GROWTH 2 DAYS Performed at [facility], [address] [place], [place] [address]    Report Status PENDING  Incomplete  MRSA Next Gen by PCR, Nasal     Status: None   Collection Time: 01/15/21  2:02 PM   Specimen: Nasal Mucosa; Nasal Swab  Result Value Ref Range Status   MRSA by PCR Next Gen NOT DETECTED NOT DETECTED Final    Comment: (NOTE) The GeneXpert MRSA Assay (FDA approved for NASAL specimens only), is one component of a comprehensive MRSA colonization surveillance program. It is not intended to diagnose MRSA infection nor to guide or monitor treatment for MRSA infections. Test performance is not FDA approved in patients less than 63 years old. Performed at [facility], [address], [place], [place] [address]          Radiology  Studies: [facility] Chest [place] 1 View  Result Date: 01/14/2021 CLINICAL DATA:  Shortness of breath.  History of cancer.  Diabetes. EXAM: PORTABLE CHEST 1 VIEW.  Patient is rotated. COMPARISON:  Chest x-ray 01/13/2021, CT chest 01/13/2021 FINDINGS: Accessed right chest wall Port-A-Cath in stable position. The heart and mediastinal contours are within normal limits. Low lung volumes with persistent diffuse patchy airspace opacities. No pleural effusion. No pneumothorax. No acute osseous abnormality. Right upper quadrant surgical clips. IMPRESSION: Low lung volumes with persistent diffuse patchy airspace opacities. Electronically Signed   By: Iven Finn M.D.   On: 01/14/2021 21:08        Scheduled Meds:  acyclovir  400 mg Oral BID   apixaban  2.5 mg Oral BID   budesonide (PULMICORT) nebulizer solution  0.25 mg Nebulization BID   Chlorhexidine Gluconate Cloth  6 each Topical Daily   [START ON 01/16/2021] digoxin  0.125 mg Oral Daily   feeding supplement (NEPRO CARB STEADY)  237 mL Oral TID BM   insulin aspart  0-20 Units Subcutaneous TID WC   insulin aspart  0-5 Units Subcutaneous QHS   insulin glargine-yfgn  10 Units Subcutaneous Daily   ipratropium  0.5 mg Nebulization Q6H   levalbuterol  1.25 mg Nebulization Q6H   methylPREDNISolone (SOLU-MEDROL) injection  40 mg Intravenous Q12H   [START ON 01/16/2021] multivitamin with minerals  1 tablet Oral Daily   sodium chloride flush  10-40 mL Intracatheter Q12H   Continuous Infusions:  azithromycin 500 mg (01/14/21 1848)   ceFEPime (MAXIPIME) IV 2 g (01/15/21 1629)     LOS: 2 days    Time spent: 27mns    JKathie Dike MD [facility]   If 7PM-7AM, please contact night-coverage [email or web address]  01/15/2021, 7:36 PM

## 2021-01-15 NOTE — Progress Notes (Signed)
Inpatient Diabetes Program Recommendations  AACE/ADA: New Consensus Statement on Inpatient Glycemic Control (2015)  Target Ranges:  Prepandial:   less than 140 mg/dL      Peak postprandial:   less than 180 mg/dL (1-2 hours)      Critically ill patients:  140 - 180 mg/dL   Lab Results  Component Value Date   GLUCAP 320 (H) 01/15/2021   HGBA1C 6.6 (H) 01/14/2021    Review of Glycemic Control Results for VIDUR, ARCOS" (MRN [id number]) as of 01/15/2021 11:41  Ref. Range 01/14/2021 16:31 01/14/2021 21:29 01/15/2021 07:13 01/15/2021 11:19  Glucose-Capillary Latest Ref Range: 70 - 99 mg/dL 263 (H) 260 (H) 258 (H) 320 (H)   Diabetes history: DM 2 Outpatient Diabetes medications: Amaryl 2 mg Daily, Metformin 500 mg tid Current orders for Inpatient glycemic control:  Novolog 0-20 units tid & HS Solumedrol 80 mg BID   Solumedrol 125 x1 dose now scheduled 80 mg Q12   Inpatient Diabetes Program Recommendations:     -  Change diet to carb modified -  Add Semglee 10 units QHS.   Thanks, Bronson Curb, MSN, RNC-OB Diabetes Coordinator [phone] (8a-5p)

## 2021-01-16 DIAGNOSIS — R778 Other specified abnormalities of plasma proteins: Secondary | ICD-10-CM | POA: Diagnosis not present

## 2021-01-16 DIAGNOSIS — J849 Interstitial pulmonary disease, unspecified: Secondary | ICD-10-CM | POA: Diagnosis not present

## 2021-01-16 DIAGNOSIS — J9621 Acute and chronic respiratory failure with hypoxia: Secondary | ICD-10-CM | POA: Diagnosis not present

## 2021-01-16 DIAGNOSIS — J189 Pneumonia, unspecified organism: Secondary | ICD-10-CM | POA: Diagnosis not present

## 2021-01-16 LAB — HAPTOGLOBIN: Haptoglobin: 404 mg/dL — ABNORMAL HIGH (ref 32–363)

## 2021-01-16 LAB — BASIC METABOLIC PANEL
Anion gap: 8 (ref 5–15)
BUN: 19 mg/dL (ref 8–23)
CO2: 28 mmol/L (ref 22–32)
Calcium: 8.3 mg/dL — ABNORMAL LOW (ref 8.9–10.3)
Chloride: 101 mmol/L (ref 98–111)
Creatinine, Ser: 0.55 mg/dL — ABNORMAL LOW (ref 0.61–1.24)
GFR, Estimated: >60 mL/min (ref >60)
Glucose, Bld: 253 mg/dL — ABNORMAL HIGH (ref 70–99)
Potassium: 4.2 mmol/L (ref 3.5–5.1)
Sodium: 137 mmol/L (ref 135–145)

## 2021-01-16 LAB — GLUCOSE, CAPILLARY
Glucose-Capillary: 272 mg/dL — ABNORMAL HIGH (ref 70–99)
Glucose-Capillary: 407 mg/dL — ABNORMAL HIGH (ref 70–99)
Glucose-Capillary: 189 mg/dL — ABNORMAL HIGH (ref 70–99)
Glucose-Capillary: 269 mg/dL — ABNORMAL HIGH (ref 70–99)

## 2021-01-16 LAB — CBC
HCT: 32.8 % — ABNORMAL LOW (ref 39.0–52.0)
Hemoglobin: 10.3 g/dL — ABNORMAL LOW (ref 13.0–17.0)
MCH: 26.4 pg (ref 26.0–34.0)
MCHC: 31.4 g/dL (ref 30.0–36.0)
MCV: 84.1 fL (ref 80.0–100.0)
Platelets: 363 10*3/uL (ref 150–400)
RBC: 3.9 MIL/uL — ABNORMAL LOW (ref 4.22–5.81)
RDW: 16.4 % — ABNORMAL HIGH (ref 11.5–15.5)
WBC: 10.3 10*3/uL (ref 4.0–10.5)
nRBC: 0 % (ref 0.0–0.2)

## 2021-01-16 LAB — MAGNESIUM: Magnesium: 2.1 mg/dL (ref 1.7–2.4)

## 2021-01-16 LAB — FUNGITELL, SERUM: Fungitell Result: <31 pg/mL (ref <80)

## 2021-01-16 LAB — PHOSPHORUS: Phosphorus: 3.3 mg/dL (ref 2.5–4.6)

## 2021-01-16 MED ORDER — LEVALBUTEROL HCL 1.25 MG/0.5ML IN NEBU
1.2500 mg | INHALATION_SOLUTION | Freq: Three times a day (TID) | RESPIRATORY_TRACT | Status: DC
  Administered 2021-01-16 – 2021-01-17 (×4): 1.25 mg via RESPIRATORY_TRACT
  Filled 2021-01-16 (×5): qty 0.5

## 2021-01-16 MED ORDER — SODIUM CHLORIDE 0.9 % IV BOLUS
500.0000 mL | Freq: Once | INTRAVENOUS | Status: AC
  Administered 2021-01-16: 500 mL via INTRAVENOUS

## 2021-01-16 MED ORDER — GUAIFENESIN-DM 100-10 MG/5ML PO SYRP
5.0000 mL | ORAL_SOLUTION | ORAL | Status: DC | PRN
  Administered 2021-01-16 – 2021-02-04 (×5): 5 mL via ORAL
  Filled 2021-01-16 (×5): qty 5

## 2021-01-16 MED ORDER — DIGOXIN 0.25 MG/ML IJ SOLN
0.1250 mg | Freq: Once | INTRAMUSCULAR | Status: AC | PRN
  Administered 2021-01-16: 0.125 mg via INTRAVENOUS
  Filled 2021-01-16: qty 2

## 2021-01-16 MED ORDER — IPRATROPIUM BROMIDE 0.02 % IN SOLN
0.5000 mg | Freq: Three times a day (TID) | RESPIRATORY_TRACT | Status: DC
  Administered 2021-01-16 – 2021-01-17 (×4): 0.5 mg via RESPIRATORY_TRACT
  Filled 2021-01-16 (×4): qty 2.5

## 2021-01-16 MED ORDER — INSULIN ASPART 100 UNIT/ML IJ SOLN
20.0000 [IU] | Freq: Once | INTRAMUSCULAR | Status: AC
  Administered 2021-01-17: 20 [IU] via SUBCUTANEOUS
  Filled 2021-01-16: qty 1

## 2021-01-16 MED ORDER — INSULIN GLARGINE-YFGN 100 UNIT/ML ~~LOC~~ SOLN
10.0000 [IU] | SUBCUTANEOUS | Status: AC
  Administered 2021-01-16: 10 [IU] via SUBCUTANEOUS
  Filled 2021-01-16: qty 0.1

## 2021-01-16 MED ORDER — INSULIN ASPART 100 UNIT/ML IJ SOLN
5.0000 [IU] | Freq: Three times a day (TID) | INTRAMUSCULAR | Status: DC
  Administered 2021-01-16 – 2021-01-19 (×10): 5 [IU] via SUBCUTANEOUS
  Filled 2021-01-16 (×9): qty 1

## 2021-01-16 MED ORDER — INSULIN GLARGINE-YFGN 100 UNIT/ML ~~LOC~~ SOLN
20.0000 [IU] | Freq: Every day | SUBCUTANEOUS | Status: DC
  Administered 2021-01-17 – 2021-01-20 (×4): 20 [IU] via SUBCUTANEOUS
  Filled 2021-01-16 (×4): qty 0.2

## 2021-01-16 NOTE — Progress Notes (Signed)
Patient's MEWS is red. Dr. Damita Dunnings informed.  BP: 96/64 MAP: 75 RR: 24 HR: 124  Vital signs monitored q1. Digoxin given @ 1959h. Alert and oriented.

## 2021-01-16 NOTE — Progress Notes (Signed)
Patient status has not changed. MEWS is still RED. Alert and oriented.   0100: Informed Dr. Damita Dunnings about his tachycardia. HR 100- 130's bpm. Bolus ordered and given. Another dose of Digoxin ordered and given to the patient. Continuously monitoring VS. No complains of chest pain. Afebrile

## 2021-01-16 NOTE — Progress Notes (Signed)
PROGRESS NOTE    Edward Atkinson  QVZ:[id number] DOB: 12/10/1960 DOA: 01/13/2021 PCP: Rusty Aus, MD    Brief Narrative:  60 year old male with a history of chronic respiratory failure 2 L of oxygen, interstitial lung disease related to covid pneumonia, prior history of lymphoma, admitted to the hospital with worsening shortness of breath and hypoxia.  Found to have possible pneumonia versus worsening ILD.  Started on antibiotics and steroids.  Pulmonology consulted.    Assessment & Plan:   Principal Problem:   Acute on chronic respiratory failure with hypoxia (HCC) Active Problems:   Type 2 diabetes mellitus without complication, without long-term current use of insulin (HCC)   SOB (shortness of breath)   Interstitial lung disease (HCC)   Severe sepsis (HCC)   CAP (community acquired pneumonia)   Elevated troponin   Protein calorie malnutrition (HCC)   Protein-calorie malnutrition, severe   Acute on chronic respiratory failure with hypoxia -Patient is chronically on 2 L of oxygen -Worsening shortness of breath and hypoxia over the past week, noted to have oxygen saturations in the 70s on 2 L -Initially placed on BiPAP, but has subsequently transitioned to high flow nasal cannula -Currently requiring between 10 to 15 L -CTA chest negative for PE, does comment on possible progressive ILD versus pneumonia -He has been started on intravenous antibiotics with cefepime and azithromycin -Pulmonology following, appreciate assistance -He has requested that we order ESR, CRP, Fungitell, histoplasmosis antigen, aspergillosis antigen, fungal cultures, AFB and sputum culture. -Patient is also on intravenous steroids, bronchodilators and inhaled steroids -We will continue to wean down oxygen as tolerated  Possible sepsis due to bilateral pneumonia -Patient presented with leukocytosis, tachycardia and tachypnea -Currently on IV antibiotics -Follow-up cultures  Rapid atrial  fibrillation -Patient with heart rate in the 140s, blood pressure high 90s to low 100s -Borderline blood pressures limit the use of beta-blocker/calcium channel blockers -Amiodarone probably not the best choice for this patient with his known interstitial lung disease -Appreciate cardiology assistance -Currently started on digoxin -Can consider AV nodal blocking agents if blood pressure will allow -Patient is already on chronic anticoagulation with apixaban  Anemia -No obvious blood loss -Continue to follow hemoglobin, currently stable -Stool FOBT has been ordered  Mild elevation of troponin -No significant chest pain at this time or EKG changes -Suspect this is demand ischemia in the setting of respiratory illness  Non-insulin-dependent diabetes, uncontrolled with hyperglycemia -Blood sugars elevated in setting of steroids -Oral agents currently on hold -Monitor on sliding scale insulin -Started on basal insulin  History of pulmonary embolism -He is chronically on apixaban. -CT angiogram of chest on admission is negative for pulmonary embolism    DVT prophylaxis: apixaban (ELIQUIS) tablet 2.5 mg Start: 01/13/21 2300 SCDs Start: 01/13/21 1831 apixaban (ELIQUIS) tablet 2.5 mg  Code Status: Full code Family Communication: Discussed with wife at the bedside Disposition Plan: Status is: Inpatient  Remains inpatient appropriate because:Ongoing diagnostic testing needed not appropriate for outpatient work up, IV treatments appropriate due to intensity of illness or inability to take PO, and Inpatient level of care appropriate due to severity of illness  Dispo: The patient is from: Home              Anticipated d/c is to: Home              Patient currently is not medically stable to d/c.   Difficult to place patient No         Consultants:  Pulmonology  Cardiology  Procedures:    Antimicrobials:  Cefepime 9/14 > Azithromycin 9/14 >   Subjective: Denies any  worsening shortness of breath.  He does not have any palpitations.  Objective: Vitals:   01/16/21 0500 01/16/21 0725 01/16/21 1100 01/16/21 1538  BP:  98/64 104/74 114/69  Pulse:  96 (!) 102 (!) 108  Resp:  _0 Temp:  97.6 F (36.4 C) 98 F (36.7 C) 98.1 F (36.7 C)  TempSrc:  Oral Oral Oral  SpO2:  100% 98% 98%  Weight: 67.8 kg     Height:        Intake/Output Summary (Last 24 hours) at 01/16/2021 1850 Last data filed at 01/16/2021 0600 Gross per 24 hour  Intake 970.01 ml  Output 1400 ml  Net -429.99 ml   Filed Weights   01/14/21 1629 01/15/21 0327 01/16/21 0500  Weight: 60.1 kg 67.8 kg 67.8 kg    Examination:  General exam: Appears calm and comfortable  Respiratory system: Clear to auscultation. Respiratory effort normal. Cardiovascular system: Irregularly irregular. No JVD, murmurs, rubs, gallops or clicks. No pedal edema. Gastrointestinal system: Abdomen is nondistended, soft and nontender. No organomegaly or masses felt. Normal bowel sounds heard. Central nervous system: Alert and oriented. No focal neurological deficits. Extremities: Symmetric 5 x 5 power. Skin: No rashes, lesions or ulcers Psychiatry: Judgement and insight appear normal. Mood & affect appropriate.     Data Reviewed: I have personally reviewed following labs and imaging studies  CBC: Recent Labs  Lab 01/13/21 1333 01/14/21 0702 01/15/21 0440 01/16/21 0610  WBC 12.3* 3.2* 7.0 10.3  NEUTROABS 9.5* 2.6  --   --   HGB 13.5 11.2* 10.2* 10.3*  HCT 41.6 34.7* 32.0* 32.8*  MCV 84.9 82.8 82.5 84.1  PLT 440* 320 332 426   Basic Metabolic Panel: Recent Labs  Lab 01/13/21 1333 01/14/21 0702 01/15/21 0440 01/16/21 0610  NA 131* 136 134* 137  K 4.3 4.2 3.9 4.2  CL 92* 98 99 101  CO2 _1 GLUCOSE 181* 244* 245* 253*  BUN _2 CREATININE 0.79 0.60* 0.61 0.55*  CALCIUM 9.3 9.1 8.7* 8.3*  MG 2.3 2.2  --  2.1  PHOS  --  4.3  --  3.3   GFR: Estimated Creatinine  Clearance: 91.8 mL/min (A) (by C-G formula based on SCr of 0.55 mg/dL (L)). Liver Function Tests: Recent Labs  Lab 01/13/21 1333 01/14/21 0702  AST 50* 30  ALT 37 33  ALKPHOS 109 89  BILITOT 0.7 1.1  PROT 7.2 6.6  ALBUMIN 3.1* 3.0*   No results for input(s): LIPASE, AMYLASE in the last 168 hours. No results for input(s): AMMONIA in the last 168 hours. Coagulation Profile: No results for input(s): INR, PROTIME in the last 168 hours. Cardiac Enzymes: No results for input(s): CKTOTAL, CKMB, CKMBINDEX, TROPONINI in the last 168 hours. BNP (last 3 results) No results for input(s): PROBNP in the last 8760 hours. HbA1C: Recent Labs    01/14/21 1621  HGBA1C 6.6*   CBG: Recent Labs  Lab 01/15/21 1632 01/15/21 2030 01/16/21 0753 01/16/21 1107 01/16/21 1624  GLUCAP 289* 236* 272* 407* 189*   Lipid Profile: No results for input(s): CHOL, HDL, LDLCALC, TRIG, CHOLHDL, LDLDIRECT in the last 72 hours. Thyroid Function Tests: No results for input(s): TSH, T4TOTAL, FREET4, T3FREE, THYROIDAB in the last 72 hours. Anemia Panel: No results for input(s): VITAMINB12, FOLATE, FERRITIN, TIBC, IRON, RETICCTPCT in the last 72 hours.  Sepsis Labs: Recent Labs  Lab 01/13/21 1909 01/13/21 1911 01/14/21 0702 01/15/21 0440  PROCALCITON  --  0.96 0.84 0.41  LATICACIDVEN 0.9  --  1.4  --     Recent Results (from the past 240 hour(s))  Resp Panel by RT-PCR (Flu A&B, Covid) Nasopharyngeal Swab     Status: None   Collection Time: 01/13/21  1:33 PM   Specimen: Nasopharyngeal Swab; Nasopharyngeal(NP) swabs in vial transport medium  Result Value Ref Range Status   SARS Coronavirus 2 by RT PCR NEGATIVE NEGATIVE Final    Comment: (NOTE) SARS-CoV-2 target nucleic acids are NOT DETECTED.  The SARS-CoV-2 RNA is generally detectable in upper respiratory specimens during the acute phase of infection. The lowest concentration of SARS-CoV-2 viral copies this assay can detect is 138 copies/mL. A  negative result does not preclude SARS-Cov-2 infection and should not be used as the sole basis for treatment or other patient management decisions. A negative result may occur with  improper specimen collection/handling, submission of specimen other than nasopharyngeal swab, presence of viral mutation(s) within the areas targeted by this assay, and inadequate number of viral copies(<138 copies/mL). A negative result must be combined with clinical observations, patient history, and epidemiological information. The expected result is Negative.  Fact Sheet for Patients:  EntrepreneurPulse.com.au  Fact Sheet for Healthcare Providers:  IncredibleEmployment.be  This test is no t yet approved or cleared by the [place] FDA and  has been authorized for detection and/or diagnosis of SARS-CoV-2 by FDA under an Emergency Use Authorization (EUA). This EUA will remain  in effect (meaning this test can be used) for the duration of the COVID-19 declaration under Section 564(b)(1) of the Act, 21 U.S.C.section 360bbb-3(b)(1), unless the authorization is terminated  or revoked sooner.       Influenza A by PCR NEGATIVE NEGATIVE Final   Influenza B by PCR NEGATIVE NEGATIVE Final    Comment: (NOTE) The Xpert Xpress SARS-CoV-2/FLU/RSV plus assay is intended as an aid in the diagnosis of influenza from Nasopharyngeal swab specimens and should not be used as a sole basis for treatment. Nasal washings and aspirates are unacceptable for Xpert Xpress SARS-CoV-2/FLU/RSV testing.  Fact Sheet for Patients: EntrepreneurPulse.com.au  Fact Sheet for Healthcare Providers: IncredibleEmployment.be  This test is not yet approved or cleared by the [place] FDA and has been authorized for detection and/or diagnosis of SARS-CoV-2 by FDA under an Emergency Use Authorization (EUA). This EUA will remain in effect (meaning this test can  be used) for the duration of the COVID-19 declaration under Section 564(b)(1) of the Act, 21 U.S.C. section 360bbb-3(b)(1), unless the authorization is terminated or revoked.  Performed at [facility], [address] [place], [place] [address]   Blood culture (routine x 2)     Status: None (Preliminary result)   Collection Time: 01/13/21  6:20 PM   Specimen: BLOOD  Result Value Ref Range Status   Specimen Description BLOOD RIGHT ANTECUBITAL  Final   Special Requests   Final    BOTTLES DRAWN AEROBIC AND ANAEROBIC Blood Culture adequate volume   Culture   Final    NO GROWTH 3 DAYS Performed at [facility], [address] [place], [place] [address]    Report Status PENDING  Incomplete  Blood culture (routine x 2)     Status: None (Preliminary result)   Collection Time: 01/13/21  6:20 PM   Specimen: BLOOD  Result Value Ref Range Status   Specimen Description BLOOD LEFT ANTECUBITAL  Final   Special Requests   Final    BOTTLES DRAWN AEROBIC AND ANAEROBIC Blood Culture adequate volume   Culture   Final    NO GROWTH 3 DAYS Performed at [facility], [address] [place], [place] [address]    Report Status PENDING  Incomplete  Respiratory (~20 pathogens) panel by PCR     Status: None   Collection Time: 01/15/21  2:01 PM   Specimen: Nasopharyngeal Swab; Respiratory  Result Value Ref Range Status   Adenovirus NOT DETECTED NOT DETECTED Final   Coronavirus 229E NOT DETECTED NOT DETECTED Final    Comment: (NOTE) The Coronavirus on the Respiratory Panel, DOES NOT test for the novel  Coronavirus (2019 nCoV)    Coronavirus HKU1 NOT DETECTED NOT DETECTED Final   Coronavirus NL63 NOT DETECTED NOT DETECTED Final   Coronavirus OC43 NOT DETECTED NOT DETECTED Final   Metapneumovirus NOT DETECTED NOT DETECTED Final   Rhinovirus / Enterovirus NOT DETECTED NOT DETECTED Final   Influenza A NOT DETECTED NOT DETECTED Final   Influenza B NOT DETECTED NOT  DETECTED Final   Parainfluenza Virus 1 NOT DETECTED NOT DETECTED Final   Parainfluenza Virus 2 NOT DETECTED NOT DETECTED Final   Parainfluenza Virus 3 NOT DETECTED NOT DETECTED Final   Parainfluenza Virus 4 NOT DETECTED NOT DETECTED Final   Respiratory Syncytial Virus NOT DETECTED NOT DETECTED Final   Bordetella pertussis NOT DETECTED NOT DETECTED Final   Bordetella Parapertussis NOT DETECTED NOT DETECTED Final   Chlamydophila pneumoniae NOT DETECTED NOT DETECTED Final   Mycoplasma pneumoniae NOT DETECTED NOT DETECTED Final    Comment: Performed at [facility], [place] [place], [place] [address]  MRSA Next Gen by PCR, Nasal     Status: None   Collection Time: 01/15/21  2:02 PM   Specimen: Nasal Mucosa; Nasal Swab  Result Value Ref Range Status   MRSA by PCR Next Gen NOT DETECTED NOT DETECTED Final    Comment: (NOTE) The GeneXpert MRSA Assay (FDA approved for NASAL specimens only), is one component of a comprehensive MRSA colonization surveillance program. It is not intended to diagnose MRSA infection nor to guide or monitor treatment for MRSA infections. Test performance is not FDA approved in patients less than 13 years old. Performed at [facility], [address], [place], [place] [address]          Radiology Studies: [facility] Chest [place] 1 View  Result Date: 01/14/2021 CLINICAL DATA:  Shortness of breath.  History of cancer.  Diabetes. EXAM: PORTABLE CHEST 1 VIEW.  Patient is rotated. COMPARISON:  Chest x-ray 01/13/2021, CT chest 01/13/2021 FINDINGS: Accessed right chest wall Port-A-Cath in stable position. The heart and mediastinal contours are within normal limits. Low lung volumes with persistent diffuse patchy airspace opacities. No pleural effusion. No pneumothorax. No acute osseous abnormality. Right upper quadrant surgical clips. IMPRESSION: Low lung volumes with persistent diffuse patchy airspace opacities. Electronically Signed   By: Iven Finn  M.D.   On: 01/14/2021 21:08        Scheduled Meds:  acyclovir  400 mg Oral BID   apixaban  2.5 mg Oral BID   budesonide (PULMICORT) nebulizer solution  0.25 mg Nebulization BID   Chlorhexidine Gluconate Cloth  6 each Topical Daily   digoxin  0.125 mg Oral Daily   feeding supplement (NEPRO CARB STEADY)  237 mL Oral TID BM   insulin aspart  0-20 Units Subcutaneous TID WC   insulin aspart  0-5 Units Subcutaneous QHS   insulin aspart  20 Units Subcutaneous Once   insulin aspart  5 Units Subcutaneous TID WC   [START ON 01/17/2021] insulin glargine-yfgn  20 Units Subcutaneous Daily   ipratropium  0.5 mg Nebulization TID   levalbuterol  1.25 mg Nebulization TID   methylPREDNISolone (SOLU-MEDROL) injection  40 mg Intravenous Q12H   multivitamin with minerals  1 tablet Oral Daily   sodium chloride flush  10-40 mL Intracatheter Q12H   Continuous Infusions:  azithromycin 500 mg (01/14/21 1848)   ceFEPime (MAXIPIME) IV 2 g (01/16/21 1516)     LOS: 3 days    Time spent: 61mns    JKathie Dike MD [facility]   If 7PM-7AM, please contact night-coverage [email or web address]  01/16/2021, 6:50 PM

## 2021-01-16 NOTE — Progress Notes (Signed)
[facility] Cardiology  CARDIOLOGY CONSULT NOTE  Patient ID: Edward Atkinson MRN: [id number] DOB/AGE: 62/06/1960 62 y.o.  Admit date: 01/13/2021 Referring Physician Memon Primary Physician [facility] Cardiologist  Reason for Consultation atrial fibrillation with rapid ventricular rate  HPI: 60 year old gentleman referred for atrial fibrillation with rapid ventricular rate.  Patient admitted 01/13/2021 with acute on chronic hypoxic respiratory failure.  Patient has a history of lymphoma.  He reports symptoms of shortness of breath and dating back to chemotherapy for lymphoma.  The patient's had recent history of COVID-19 and subsequently has developed interstitial lung disease followed by Dr. Lanney Gins.  Patient currently on outpatient regimen which includes Breztri, Singulair Pulmicort nebulizer, and rescue albuterol inhaler.  The patient is on home O2 2 L by nasal cannula.  The emergency room, patient noted to be hypoxic with O2 saturation of 70% on 2 L which prompted to BiPAP and transition to 6 L by nasal cannula.  Patient was treated with intravenous steroids, and broad-spectrum antibiotics.  ECG revealed atrial fibrillation at a rate of 148 bpm.  Patient has remained in atrial fibrillation, rates typically 120 to 150 bpm.  Patient is asymptomatic, denies chest pain, chest tightness, palpitations or heart racing.  Blood pressure is low normal which obviated use of AV nodal blocking agents.  In addition, amiodarone was deferred in light of patient's interstitial lung disease.  Patient received IV digoxin 0.25 mg loading dose followed by a second 0.125 mg dose of breath modest rate control.  Patient started on digoxin 0.125 mg p.o. daily.  Review of systems complete and found to be negative unless listed above     Past Medical History:  Diagnosis Date   Chronic respiratory failure with hypoxia (HCC)    2L [place] continuous   COVID-19    Diabetes mellitus without complication (HCC)    Diverticulitis     Follicular lymphoma of intra-abdominal lymph nodes ([place]) 02/15/2019   ILD (interstitial lung disease) ([place])     Past Surgical History:  Procedure Laterality Date   CHOLECYSTECTOMY     COLON RESECTION     DUE TO DIVERTICULITIS   FLEXIBLE BRONCHOSCOPY N/A 12/02/2019   Procedure: FLEXIBLE BRONCHOSCOPY;  Surgeon: Ottie Glazier, MD;  Location: [facility] ORS;  Service: Thoracic;  Laterality: N/A;   PORTA CATH INSERTION N/A 03/11/2019   Procedure: PORTA CATH INSERTION;  Surgeon: Algernon Huxley, MD;  Location: [place] [facility];  Service: Cardiovascular;  Laterality: N/A;   PULMONARY THROMBECTOMY N/A 01/29/2019   Procedure: PULMONARY THROMBECTOMY;  Surgeon: Algernon Huxley, MD;  Location: [place] [facility];  Service: Cardiovascular;  Laterality: N/A;    Medications Prior to Admission  Medication Sig Dispense Refill Last Dose   acyclovir (ZOVIRAX) 400 MG tablet Take 1 tablet (400 mg total) by mouth 2 (two) times daily. 180 tablet 1 01/13/2021 at 0900   albuterol (PROVENTIL) (2.5 MG/3ML) 0.083% nebulizer solution Inhale into the lungs.   01/13/2021 at 0930   budesonide (PULMICORT) 1 MG/2ML nebulizer solution Inhale into the lungs.   01/13/2021 at 0915   dextromethorphan-guaiFENesin (MUCINEX DM) 30-600 MG 12hr tablet Take 1 tablet by mouth 2 (two) times daily as needed for cough.    01/13/2021 at 0900   ELIQUIS 2.5 MG TABS tablet TAKE 1 TABLET BY MOUTH TWICE A DAY 60 tablet 3 01/13/2021 at 0900   glimepiride (AMARYL) 2 MG tablet Take 2 mg by mouth every morning.   01/13/2021 at 0900   metFORMIN (GLUCOPHAGE) 500 MG tablet Take 500  mg by mouth 3 (three) times daily with meals.    01/13/2021 at 0900   montelukast (SINGULAIR) 10 MG tablet Take 10 mg by mouth at bedtime.    01/12/2021 at 2100   albuterol (VENTOLIN HFA) 108 (90 Base) MCG/ACT inhaler    unknown at prn   BREZTRI AEROSPHERE 160-9-4.8 MCG/ACT AERO TWO INHALATIONS INTO LUNGS TWICE DAILY   unknown at prn   COMBIVENT RESPIMAT 20-100 MCG/ACT AERS respimat     unknown at prn   lidocaine-prilocaine (EMLA) cream Apply 1 application topically as needed. 30 g 1 unknown at prn   prochlorperazine (COMPAZINE) 10 MG tablet Take 1 tablet (10 mg total) by mouth every 6 (six) hours as needed (Nausea or vomiting). 30 tablet 1 unknown at prnm   Social History   Socioeconomic History   Marital status: Married    Spouse name: Not on file   Number of children: Not on file   Years of education: Not on file   Highest education level: Not on file  Occupational History   Not on file  Tobacco Use   Smoking status: Never   Smokeless tobacco: Never  Vaping Use   Vaping Use: Never used  Substance and Sexual Activity   Alcohol use: Yes    Alcohol/week: 0.0 - 2.0 standard drinks    Comment: occasional   Drug use: Never   Sexual activity: Yes  Other Topics Concern   Not on file  Social History Narrative   Not on file   Social Determinants of Health   Financial Resource Strain: Not on file  Food Insecurity: Not on file  Transportation Needs: Not on file  Physical Activity: Not on file  Stress: Not on file  Social Connections: Not on file  Intimate Partner Violence: Not on file    Family History  Problem Relation Age of Onset   Diabetes Brother       Review of systems complete and found to be negative unless listed above      PHYSICAL EXAM  General: Well developed, well nourished, in no acute distress HEENT:  Normocephalic and atramatic Neck:  No JVD.  Lungs: Clear bilaterally to auscultation and percussion. Heart: HRRR . Normal S1 and S2 without gallops or murmurs.  Abdomen: Bowel sounds are positive, abdomen soft and non-tender  Msk:  Back normal, normal gait. Normal strength and tone for age. Extremities: No clubbing, cyanosis or edema.   Neuro: Alert and oriented X 3. Psych:  Good affect, responds appropriately  Labs:   Lab Results  Component Value Date   WBC 10.3 01/16/2021   HGB 10.3 (L) 01/16/2021   HCT 32.8 (L) 01/16/2021    MCV 84.1 01/16/2021   PLT 363 01/16/2021    Recent Labs  Lab 01/14/21 0702 01/15/21 0440 01/16/21 0610  NA 136   < > 137  K 4.2   < > 4.2  CL 98   < > 101  CO2 23   < > 28  BUN 19   < > 19  CREATININE 0.60*   < > 0.55*  CALCIUM 9.1   < > 8.3*  PROT 6.6  --   --   BILITOT 1.1  --   --   ALKPHOS 89  --   --   ALT 33  --   --   AST 30  --   --   GLUCOSE 244*   < > 253*   < > = values in this interval not displayed.  No results found for: CKTOTAL, CKMB, CKMBINDEX, TROPONINI No results found for: CHOL No results found for: HDL No results found for: LDLCALC No results found for: TRIG No results found for: CHOLHDL No results found for: LDLDIRECT    Radiology: CT Angio Chest PE W/Cm &/Or Wo Cm  Result Date: 01/13/2021 CLINICAL DATA:  Respiratory distress, hypoxia EXAM: CT ANGIOGRAPHY CHEST WITH CONTRAST TECHNIQUE: Multidetector CT imaging of the chest was performed using the standard protocol during bolus administration of intravenous contrast. Multiplanar CT image reconstructions and MIPs were obtained to evaluate the vascular anatomy. CONTRAST:  66m OMNIPAQUE IOHEXOL 350 MG/ML SOLN COMPARISON:  10/26/2020, 01/13/2021 FINDINGS: Cardiovascular: This is a technically adequate evaluation of the pulmonary vasculature. No filling defects or pulmonary emboli. The heart is unremarkable without pericardial effusion. No evidence of thoracic aortic aneurysm or dissection. Right chest wall port via internal jugular approach tip within the superior vena cava. Mediastinum/Nodes: No pathologic adenopathy within the mediastinum, hila, or axilla. Thyroid, trachea, and esophagus are unremarkable. Lungs/Pleura: There has been interval progression of the multifocal ground-glass airspace disease seen previously. Changes are most pronounced within the dependent lower lobes. No effusion or pneumothorax. The central airways are patent, with persistent bronchiectasis. Upper Abdomen: No acute abnormality.  Musculoskeletal: No acute or destructive bony lesions. Reconstructed images demonstrate no additional findings. Review of the MIP images confirms the above findings. IMPRESSION: 1. No evidence of pulmonary embolus. 2. Progressive multifocal bilateral ground-glass airspace disease, greatest at the lung bases. The differential diagnosis would include progressive postinflammatory scarring and fibrosis, multifocal atypical pneumonia, or hypersensitivity/drug toxicity. 3. Stable bronchiectasis. Electronically Signed   By: MRanda NgoM.D.   On: 01/13/2021 17:13   DG Chest Port 1 View  Result Date: 01/14/2021 CLINICAL DATA:  Shortness of breath.  History of cancer.  Diabetes. EXAM: PORTABLE CHEST 1 VIEW.  Patient is rotated. COMPARISON:  Chest x-ray 01/13/2021, CT chest 01/13/2021 FINDINGS: Accessed right chest wall Port-A-Cath in stable position. The heart and mediastinal contours are within normal limits. Low lung volumes with persistent diffuse patchy airspace opacities. No pleural effusion. No pneumothorax. No acute osseous abnormality. Right upper quadrant surgical clips. IMPRESSION: Low lung volumes with persistent diffuse patchy airspace opacities. Electronically Signed   By: MIven FinnM.D.   On: 01/14/2021 21:08   DG Chest Port 1 View  Result Date: 01/13/2021 CLINICAL DATA:  Shortness of breath EXAM: PORTABLE CHEST 1 VIEW COMPARISON:  Chest radiograph 07/29/2019 FINDINGS: There is a right chest wall port with the tip terminating in the lower SVC/cavoatrial junction. The cardiomediastinal silhouette is within normal limits. Lung volumes are low with unchanged asymmetric elevation of the right hemidiaphragm. There are increased interstitial markings with diffuse reticular opacities throughout both lungs. There is no focal consolidation. There is no significant pleural effusion. There is no pneumothorax. There is no acute osseous abnormality. There is gaseous distention of the stomach and large bowel  in the right upper quadrant. IMPRESSION: Low lung volumes with suspected mild pulmonary interstitial edema. Atypical/viral infection could have a similar appearance. Electronically Signed   By: PValetta MoleM.D.   On: 01/13/2021 14:24    EKG: Atrial fibrillation at 148 bpm  ASSESSMENT AND PLAN:   1.  Atrial fibrillation with rapid ventricular rate, modest improvement after digoxin IV load, compensatory for respiratory failure, patient asymptomatic.  Patient on Eliquis with history of pulmonary embolus. 2.  Respiratory failure, and patient with COVID-19 interstitial lung disease, slowly improving 3.  History of pulmonary embolus, on  Eliquis 4.  Borderline elevated high-sensitivity troponin (13, 22, 8), in the absence of chest pain or ischemic ECG changes, very likely due to demand supply ischemia in setting of atrial fibrillation with rapid ventricular rate and respiratory failure  Recommendations  1.  Agree with overall current therapy 2.  Continue Eliquis for stroke prevention 3.  Continue digoxin 0.125 mg p.o. daily for rate control 4.  Defer amiodarone in light of patient's history of interstitial lung disease 5.  Defer AV nodal blocking agents that can lower blood pressure in light of low BP  Signed: Isaias Cowman MD,PhD, [facility] 01/16/2021, 11:33 AM

## 2021-01-16 NOTE — Progress Notes (Addendum)
   01/16/21 2005  Assess: MEWS Score  Temp 97.8 F (36.6 C)  BP (!) 107/49  ECG Heart Rate (!) 126  Resp (!) 27  Level of Consciousness Alert  SpO2 96 %  O2 Device HFNC  Patient Activity (if Appropriate) In bed  O2 Flow Rate (L/min) 8 L/min  Assess: MEWS Score  MEWS Temp 0  MEWS Systolic 0  MEWS Pulse 2  MEWS RR 2  MEWS LOC 0  MEWS Score 4  MEWS Score Color Red  Assess: if the MEWS score is Yellow or Red  Were vital signs taken at a resting state? Yes  Focused Assessment No change from prior assessment  Does the patient meet 2 or more of the SIRS criteria? Yes  Does the patient have a confirmed or suspected source of infection? No  MEWS guidelines implemented *See Row Information* Yes  Treat  MEWS Interventions Escalated (See documentation below)  Pain Scale 0-10  Pain Score 0  Take Vital Signs  Increase Vital Sign Frequency  Red: Q 1hr X 4 then Q 4hr X 4, if remains red, continue Q 4hrs  Escalate  MEWS: Escalate Red: discuss with charge nurse/RN and provider, consider discussing with RRT  Notify: Charge Nurse/RN  Name of Charge Nurse/RN Notified Lindsey, RN  Date Charge Nurse/RN Notified 01/16/21  Time Charge Nurse/RN Notified 2030  Notify: Provider  Provider Name/Title Damita Dunnings MD via Secure Chat  Date Provider Notified 01/16/21  Assess: SIRS CRITERIA  SIRS Temperature  0  SIRS Pulse 1  SIRS Respirations  1  SIRS WBC 0  SIRS Score Sum  2   Addendum: Hospitalist on-call, Dr. Damita Dunnings, ordered 567m bolus for above MEWS. Cardiologist on-call, Dr. PSaralyn Pilar notified of above as well. No new orders from cardiology. Will implement bolus and continue to monitor.

## 2021-01-17 ENCOUNTER — Inpatient Hospital Stay: Payer: BC Managed Care – PPO

## 2021-01-17 DIAGNOSIS — R778 Other specified abnormalities of plasma proteins: Secondary | ICD-10-CM | POA: Diagnosis not present

## 2021-01-17 DIAGNOSIS — J189 Pneumonia, unspecified organism: Secondary | ICD-10-CM | POA: Diagnosis not present

## 2021-01-17 DIAGNOSIS — J9621 Acute and chronic respiratory failure with hypoxia: Secondary | ICD-10-CM | POA: Diagnosis not present

## 2021-01-17 DIAGNOSIS — J849 Interstitial pulmonary disease, unspecified: Secondary | ICD-10-CM | POA: Diagnosis not present

## 2021-01-17 LAB — GLUCOSE, CAPILLARY
Glucose-Capillary: 261 mg/dL — ABNORMAL HIGH (ref 70–99)
Glucose-Capillary: 432 mg/dL — ABNORMAL HIGH (ref 70–99)
Glucose-Capillary: 242 mg/dL — ABNORMAL HIGH (ref 70–99)
Glucose-Capillary: 219 mg/dL — ABNORMAL HIGH (ref 70–99)
Glucose-Capillary: 118 mg/dL — ABNORMAL HIGH (ref 70–99)

## 2021-01-17 LAB — CRYPTOCOCCAL ANTIGEN: Crypto Ag: NEGATIVE

## 2021-01-17 LAB — C-REACTIVE PROTEIN: CRP: 2.6 mg/dL — ABNORMAL HIGH (ref <1.0)

## 2021-01-17 MED ORDER — ACETYLCYSTEINE 20 % IN SOLN
4.0000 mL | Freq: Two times a day (BID) | RESPIRATORY_TRACT | Status: AC
  Administered 2021-01-17 (×2): 4 mL via RESPIRATORY_TRACT
  Filled 2021-01-17 (×2): qty 4

## 2021-01-17 MED ORDER — METHYLPREDNISOLONE SODIUM SUCC 40 MG IJ SOLR: 30.0000 mg | Freq: Two times a day (BID) | INTRAMUSCULAR | Status: DC

## 2021-01-17 MED ORDER — METHYLPREDNISOLONE SODIUM SUCC 125 MG IJ SOLR
100.0000 mg | INTRAMUSCULAR | Status: DC
Start: 2021-01-17 — End: 2021-01-18
  Administered 2021-01-17 – 2021-01-18 (×2): 100 mg via INTRAVENOUS
  Filled 2021-01-17 (×2): qty 2

## 2021-01-17 MED ORDER — MYCOPHENOLATE MOFETIL 250 MG PO CAPS
250.0000 mg | ORAL_CAPSULE | Freq: Two times a day (BID) | ORAL | Status: DC
  Administered 2021-01-17 – 2021-01-19 (×5): 250 mg via ORAL
  Filled 2021-01-17 (×8): qty 1

## 2021-01-17 MED ORDER — GUAIFENESIN ER 600 MG PO TB12
1200.0000 mg | ORAL_TABLET | Freq: Two times a day (BID) | ORAL | Status: DC
  Administered 2021-01-17 – 2021-02-19 (×67): 1200 mg via ORAL
  Filled 2021-01-17 (×67): qty 2

## 2021-01-17 MED ORDER — DILTIAZEM HCL ER COATED BEADS 120 MG PO CP24
120.0000 mg | ORAL_CAPSULE | Freq: Every day | ORAL | Status: DC
  Administered 2021-01-17: 120 mg via ORAL
  Filled 2021-01-17: qty 1

## 2021-01-17 NOTE — Progress Notes (Signed)
PROGRESS NOTE    Edward Atkinson  [id number] DOB: [birth year] DOA: 01/13/2021 PCP: Rusty Aus, MD    Brief Narrative:  60 year old male with a history of chronic respiratory failure 2 L of oxygen, interstitial lung disease related to covid pneumonia, prior history of lymphoma, admitted to the hospital with worsening shortness of breath and hypoxia.  Found to have possible pneumonia versus worsening ILD.  Started on antibiotics and steroids.  Pulmonology consulted.    Assessment & Plan:   Principal Problem:   Acute on chronic respiratory failure with hypoxia (HCC) Active Problems:   Type 2 diabetes mellitus without complication, without long-term current use of insulin (HCC)   SOB (shortness of breath)   Interstitial lung disease (HCC)   Severe sepsis (HCC)   CAP (community acquired pneumonia)   Elevated troponin   Protein calorie malnutrition (HCC)   Protein-calorie malnutrition, severe   Acute on chronic respiratory failure with hypoxia -Patient is chronically on 2 L of oxygen -Worsening shortness of breath and hypoxia over the past week, noted to have oxygen saturations in the 70s on 2 L -Initially placed on BiPAP, but has subsequently transitioned to high flow nasal cannula -Currently requiring 8L -CTA chest negative for PE, does comment on possible progressive ILD versus pneumonia -He is currently on intravenous antibiotics with cefepime and azithromycin -Pulmonology following, appreciate assistance -inflammatory markers elevated -respiratory viral panel negative -cultures in process -Patient is also on intravenous steroids, bronchodilators and inhaled steroids -We will continue to wean down oxygen as tolerated -started on cellcept  Possible sepsis due to bilateral pneumonia -Patient presented with leukocytosis, tachycardia and tachypnea -Currently on IV antibiotics -Follow-up cultures  Rapid atrial fibrillation -continues to have rapid heart  rate -Amiodarone probably not the best choice for this patient with his known interstitial lung disease -Appreciate cardiology assistance -Currently on digoxin -since blood pressures are mildly better today, he has been started on diltiazem  -Patient is already on chronic anticoagulation with apixaban  Anemia -No obvious blood loss -Continue to follow hemoglobin, currently stable -Stool FOBT has been ordered  Mild elevation of troponin -No significant chest pain at this time or EKG changes -Suspect this is demand ischemia in the setting of respiratory illness  Non-insulin-dependent diabetes, uncontrolled with hyperglycemia -Blood sugars elevated in setting of steroids -Oral agents currently on hold -Monitor on sliding scale insulin -Started on basal insulin  History of pulmonary embolism -He is chronically on apixaban. -CT angiogram of chest on admission is negative for pulmonary embolism    DVT prophylaxis: apixaban (ELIQUIS) tablet 2.5 mg Start: 01/13/21 2300 SCDs Start: 01/13/21 1831 apixaban (ELIQUIS) tablet 2.5 mg  Code Status: Full code Family Communication: Discussed with wife at the bedside Disposition Plan: Status is: Inpatient  Remains inpatient appropriate because:Ongoing diagnostic testing needed not appropriate for outpatient work up, IV treatments appropriate due to intensity of illness or inability to take PO, and Inpatient level of care appropriate due to severity of illness  Dispo: The patient is from: Home              Anticipated d/c is to: Home              Patient currently is not medically stable to d/c.   Difficult to place patient No         Consultants:  Pulmonology Cardiology  Procedures:    Antimicrobials:  Cefepime 9/14 > Azithromycin 9/14 >   Subjective: Denies any worsening shortness of breath.  He does not  have any palpitations.  Objective: Vitals:   01/17/21 0737 01/17/21 0913 01/17/21 1100 01/17/21 1533  BP: 113/75   (!) 115/102 126/73  Pulse: 74 (!) 138 96 (!) 118  Resp: (!) 23  (!) 30 (!) 36  Temp: 98.2 F (36.8 C)  98.2 F (36.8 C) 99.5 F (37.5 C)  TempSrc: Oral  Oral Oral  SpO2: 91%  98% 95%  Weight:      Height:        Intake/Output Summary (Last 24 hours) at 01/17/2021 1759 Last data filed at 01/17/2021 1559 Gross per 24 hour  Intake 2023.99 ml  Output 2225 ml  Net -201.01 ml   Filed Weights   01/15/21 0327 01/16/21 0500 01/17/21 0500  Weight: 67.8 kg 67.8 kg 70.2 kg    Examination:  General exam: Alert, awake, oriented x 3 Respiratory system: Clear to auscultation. Respiratory effort normal. Cardiovascular system:irregularly irregular. No murmurs, rubs, gallops. Gastrointestinal system: Abdomen is nondistended, soft and nontender. No organomegaly or masses felt. Normal bowel sounds heard. Central nervous system: Alert and oriented. No focal neurological deficits. Extremities: No C/C/E, +pedal pulses Skin: No rashes, lesions or ulcers Psychiatry: Judgement and insight appear normal. Mood & affect appropriate.      Data Reviewed: I have personally reviewed following labs and imaging studies  CBC: Recent Labs  Lab 01/13/21 1333 01/14/21 0702 01/15/21 0440 01/16/21 0610  WBC 12.3* 3.2* 7.0 10.3  NEUTROABS 9.5* 2.6  --   --   HGB 13.5 11.2* 10.2* 10.3*  HCT 41.6 34.7* 32.0* 32.8*  MCV 84.9 82.8 82.5 84.1  PLT 440* 320 332 [id number]   Basic Metabolic Panel: Recent Labs  Lab 01/13/21 1333 01/14/21 0702 01/15/21 0440 01/16/21 0610  NA 131* 136 134* 137  K 4.3 4.2 3.9 4.2  CL 92* 98 99 101  CO2 '23 23 28 28  '$ GLUCOSE 181* 244* 245* 253*  BUN '16 19 19 19  '$ CREATININE 0.79 0.60* 0.61 0.55*  CALCIUM 9.3 9.1 8.7* 8.3*  MG 2.3 2.2  --  2.1  PHOS  --  4.3  --  3.3   GFR: Estimated Creatinine Clearance: 95.1 mL/min (A) (by C-G formula based on SCr of 0.55 mg/dL (L)). Liver Function Tests: Recent Labs  Lab 01/13/21 1333 01/14/21 0702  AST 50* 30  ALT 37 33  ALKPHOS  109 89  BILITOT 0.7 1.1  PROT 7.2 6.6  ALBUMIN 3.1* 3.0*   No results for input(s): LIPASE, AMYLASE in the last 168 hours. No results for input(s): AMMONIA in the last 168 hours. Coagulation Profile: No results for input(s): INR, PROTIME in the last 168 hours. Cardiac Enzymes: No results for input(s): CKTOTAL, CKMB, CKMBINDEX, TROPONINI in the last 168 hours. BNP (last 3 results) No results for input(s): PROBNP in the last 8760 hours. HbA1C: No results for input(s): HGBA1C in the last 72 hours.  CBG: Recent Labs  Lab 01/16/21 2115 01/17/21 0739 01/17/21 1135 01/17/21 1542 01/17/21 1701  GLUCAP 269* 261* 432* 242* 219*   Lipid Profile: No results for input(s): CHOL, HDL, LDLCALC, TRIG, CHOLHDL, LDLDIRECT in the last 72 hours. Thyroid Function Tests: No results for input(s): TSH, T4TOTAL, FREET4, T3FREE, THYROIDAB in the last 72 hours. Anemia Panel: No results for input(s): VITAMINB12, FOLATE, FERRITIN, TIBC, IRON, RETICCTPCT in the last 72 hours. Sepsis Labs: Recent Labs  Lab 01/13/21 1909 01/13/21 1911 01/14/21 0702 01/15/21 0440  PROCALCITON  --  0.96 0.84 0.41  LATICACIDVEN 0.9  --  1.4  --  Recent Results (from the past 240 hour(s))  Resp Panel by RT-PCR (Flu A&B, Covid) Nasopharyngeal Swab     Status: None   Collection Time: 01/13/21  1:33 PM   Specimen: Nasopharyngeal Swab; Nasopharyngeal(NP) swabs in vial transport medium  Result Value Ref Range Status   SARS Coronavirus 2 by RT PCR NEGATIVE NEGATIVE Final    Comment: (NOTE) SARS-CoV-2 target nucleic acids are NOT DETECTED.  The SARS-CoV-2 RNA is generally detectable in upper respiratory specimens during the acute phase of infection. The lowest concentration of SARS-CoV-2 viral copies this assay can detect is 138 copies/mL. A negative result does not preclude SARS-Cov-2 infection and should not be used as the sole basis for treatment or other patient management decisions. A negative result may occur  with  improper specimen collection/handling, submission of specimen other than nasopharyngeal swab, presence of viral mutation(s) within the areas targeted by this assay, and inadequate number of viral copies(<138 copies/mL). A negative result must be combined with clinical observations, patient history, and epidemiological information. The expected result is Negative.  Fact Sheet for Patients:  EntrepreneurPulse.com.au  Fact Sheet for Healthcare Providers:  IncredibleEmployment.be  This test is no t yet approved or cleared by the [place] FDA and  has been authorized for detection and/or diagnosis of SARS-CoV-2 by FDA under an Emergency Use Authorization (EUA). This EUA will remain  in effect (meaning this test can be used) for the duration of the COVID-19 declaration under Section 564(b)(1) of the Act, 21 U.S.C.section 360bbb-3(b)(1), unless the authorization is terminated  or revoked sooner.       Influenza A by PCR NEGATIVE NEGATIVE Final   Influenza B by PCR NEGATIVE NEGATIVE Final    Comment: (NOTE) The Xpert Xpress SARS-CoV-2/FLU/RSV plus assay is intended as an aid in the diagnosis of influenza from Nasopharyngeal swab specimens and should not be used as a sole basis for treatment. Nasal washings and aspirates are unacceptable for Xpert Xpress SARS-CoV-2/FLU/RSV testing.  Fact Sheet for Patients: EntrepreneurPulse.com.au  Fact Sheet for Healthcare Providers: IncredibleEmployment.be  This test is not yet approved or cleared by the [place] FDA and has been authorized for detection and/or diagnosis of SARS-CoV-2 by FDA under an Emergency Use Authorization (EUA). This EUA will remain in effect (meaning this test can be used) for the duration of the COVID-19 declaration under Section 564(b)(1) of the Act, 21 U.S.C. section 360bbb-3(b)(1), unless the authorization is terminated  or revoked.  Performed at [facility], [address] [place], [place] [address]   Blood culture (routine x 2)     Status: None (Preliminary result)   Collection Time: 01/13/21  6:20 PM   Specimen: BLOOD  Result Value Ref Range Status   Specimen Description BLOOD RIGHT ANTECUBITAL  Final   Special Requests   Final    BOTTLES DRAWN AEROBIC AND ANAEROBIC Blood Culture adequate volume   Culture   Final    NO GROWTH 4 DAYS Performed at [facility], [address] [place], [place] [address]    Report Status PENDING  Incomplete  Blood culture (routine x 2)     Status: None (Preliminary result)   Collection Time: 01/13/21  6:20 PM   Specimen: BLOOD  Result Value Ref Range Status   Specimen Description BLOOD LEFT ANTECUBITAL  Final   Special Requests   Final    BOTTLES DRAWN AEROBIC AND ANAEROBIC Blood Culture adequate volume   Culture   Final    NO GROWTH 4 DAYS Performed  at [facility], [address] [place], [place] [address]    Report Status PENDING  Incomplete  Respiratory (~20 pathogens) panel by PCR     Status: None   Collection Time: 01/15/21  2:01 PM   Specimen: Nasopharyngeal Swab; Respiratory  Result Value Ref Range Status   Adenovirus NOT DETECTED NOT DETECTED Final   Coronavirus 229E NOT DETECTED NOT DETECTED Final    Comment: (NOTE) The Coronavirus on the Respiratory Panel, DOES NOT test for the novel  Coronavirus (2019 nCoV)    Coronavirus HKU1 NOT DETECTED NOT DETECTED Final   Coronavirus NL63 NOT DETECTED NOT DETECTED Final   Coronavirus OC43 NOT DETECTED NOT DETECTED Final   Metapneumovirus NOT DETECTED NOT DETECTED Final   Rhinovirus / Enterovirus NOT DETECTED NOT DETECTED Final   Influenza A NOT DETECTED NOT DETECTED Final   Influenza B NOT DETECTED NOT DETECTED Final   Parainfluenza Virus 1 NOT DETECTED NOT DETECTED Final   Parainfluenza Virus 2 NOT DETECTED NOT DETECTED Final   Parainfluenza Virus 3 NOT DETECTED NOT  DETECTED Final   Parainfluenza Virus 4 NOT DETECTED NOT DETECTED Final   Respiratory Syncytial Virus NOT DETECTED NOT DETECTED Final   Bordetella pertussis NOT DETECTED NOT DETECTED Final   Bordetella Parapertussis NOT DETECTED NOT DETECTED Final   Chlamydophila pneumoniae NOT DETECTED NOT DETECTED Final   Mycoplasma pneumoniae NOT DETECTED NOT DETECTED Final    Comment: Performed at [facility], [place]. [address], [place], [place] [address]  MRSA Next Gen by PCR, Nasal     Status: None   Collection Time: 01/15/21  2:02 PM   Specimen: Nasal Mucosa; Nasal Swab  Result Value Ref Range Status   MRSA by PCR Next Gen NOT DETECTED NOT DETECTED Final    Comment: (NOTE) The GeneXpert MRSA Assay (FDA approved for NASAL specimens only), is one component of a comprehensive MRSA colonization surveillance program. It is not intended to diagnose MRSA infection nor to guide or monitor treatment for MRSA infections. Test performance is not FDA approved in patients less than 105 years old. Performed at [facility], [address], [place], [place] [address]          Radiology Studies: [facility] [place] 1 View  Result Date: 01/17/2021 CLINICAL DATA:  Worsening shortness of breath. EXAM: PORTABLE CHEST 1 VIEW COMPARISON:  01/14/2021 FINDINGS: 0826 hours. Low volume film. Patchy airspace disease again noted in both lungs, left greater than right. Cardiopericardial silhouette is at upper limits of normal for size. Right Port-A-Cath again noted. Telemetry leads overlie the chest. IMPRESSION: Low volume film with patchy bilateral airspace disease, left greater than right. No substantial change. Electronically Signed   By: Misty Stanley M.D.   On: 01/17/2021 08:53        Scheduled Meds:  acetylcysteine  4 mL Nebulization BID   acyclovir  400 mg Oral BID   apixaban  2.5 mg Oral BID   Chlorhexidine Gluconate Cloth  6 each Topical Daily   digoxin  0.125 mg Oral Daily   diltiazem  120 mg  Oral Daily   feeding supplement (NEPRO CARB STEADY)  237 mL Oral TID BM   guaiFENesin  1,200 mg Oral BID   insulin aspart  0-20 Units Subcutaneous TID WC   insulin aspart  0-5 Units Subcutaneous QHS   insulin aspart  5 Units Subcutaneous TID WC   insulin glargine-yfgn  20 Units Subcutaneous Daily   methylPREDNISolone (SOLU-MEDROL) injection  100 mg Intravenous  Q24H   multivitamin with minerals  1 tablet Oral Daily   mycophenolate  250 mg Oral BID   sodium chloride flush  10-40 mL Intracatheter Q12H   Continuous Infusions:  azithromycin Stopped (01/16/21 2043)   ceFEPime (MAXIPIME) IV 2 g (01/17/21 1547)     LOS: 4 days    Time spent: 44mns    JKathie Dike MD [facility]   If 7PM-7AM, please contact night-coverage [email or web address]  01/17/2021, 5:59 PM

## 2021-01-17 NOTE — TOC Initial Note (Signed)
Transition of Care [facility]) - Initial/Assessment Note    Patient Details  Name: Edward Atkinson MRN: [id number] Date of Birth: 17-Dec-1960  [facility] CM/SW Contact:    Candie Chroman, LCSW Phone Number: 01/17/2021, 10:22 AM  Clinical Narrative:    Readmission prevention screen complete. CSW met with patient. Wife at bedside. CSW introduced role and explained that discharge planning would be discussed. PCP is Emily Filbert, MD at [facility]. Patient was driving up until recently due to weakness. Wife has since been driving to appointments. [facility] but [facility] [address] because he uses the coupon there. No issues obtaining medications. No home health prior to admission. No DME use to get around at home. He is on 2 L oxygen at home through [employer]. Currently on HFNC 8 L. Will notify [employer] if he requires increased oxygen at discharge. No further concerns. CSW encouraged patient and his wife to contact CSW as needed. CSW will continue to follow patient and his wife for support and facilitate return home when stable.             Expected Discharge Plan: Home/Self Care Barriers to Discharge: Continued Medical Work up   Patient Goals and CMS Choice        Expected Discharge Plan and Services Expected Discharge Plan: Home/Self Care     Post Acute Care Choice: NA Living arrangements for the past 2 months: Single Family Home                                      Prior Living Arrangements/Services Living arrangements for the past 2 months: Single Family Home Lives with:: Spouse Patient language and need for interpreter reviewed:: Yes Do you feel safe going back to the place where you live?: Yes      Need for Family Participation in Patient Care: Yes (Comment) Care giver support system in place?: Yes (comment) Current home services: DME Criminal Activity/Legal Involvement Pertinent to Current Situation/Hospitalization:  No - Comment as needed  Activities of Daily Living Home Assistive Devices/Equipment: Oxygen ADL Screening (condition at time of admission) Patient's cognitive ability adequate to safely complete daily activities?: Yes Is the patient deaf or have difficulty hearing?: No Does the patient have difficulty seeing, even when wearing glasses/contacts?: No Does the patient have difficulty concentrating, remembering, or making decisions?: No Patient able to express need for assistance with ADLs?: Yes Does the patient have difficulty dressing or bathing?: No Independently performs ADLs?: Yes (appropriate for developmental age) Does the patient have difficulty walking or climbing stairs?: No Weakness of Legs: None Weakness of Arms/Hands: None  Permission Sought/Granted Permission sought to share information with : Family Supports Permission granted to share information with : Yes, Verbal Permission Granted  Share Information with NAME: Edward Atkinson     Permission granted to share info w Relationship: Wife  Permission granted to share info w Contact Information: [phone]  Emotional Assessment Appearance:: Appears stated age Attitude/Demeanor/Rapport: Engaged, Gracious Affect (typically observed): Accepting, Appropriate, Calm, Pleasant Orientation: : Oriented to Self, Oriented to Place, Oriented to  Time, Oriented to Situation Alcohol / Substance Use: Not Applicable Psych Involvement: No (comment)  Admission diagnosis:  Shortness of breath [R06.02] Hypoxia [R09.02] Acute on chronic respiratory failure with hypoxia (HCC) [J96.21] Patient Active Problem List   Diagnosis Date Noted   Protein-calorie malnutrition, severe 01/15/2021  Severe sepsis ([place]) 01/14/2021   CAP (community acquired pneumonia) 01/14/2021   Elevated troponin 01/14/2021   Protein calorie malnutrition ([place]) 01/14/2021   Acute on chronic respiratory failure with hypoxia ([place]) 01/13/2021   Encounter for monoclonal  antibody treatment for malignancy 07/02/2020   Interstitial lung disease ([place]) 07/02/2020   Lymphocytopenia    SOB (shortness of breath)    Sepsis ([place]) 07/29/2019   Thrombocytopenia ([place]) 07/29/2019   History of DVT (deep vein thrombosis) 07/06/2019   Transaminitis 07/06/2019   History of pulmonary embolism 07/06/2019   Encounter for antineoplastic chemotherapy 04/29/2019   Pulmonary embolus ([place]) 79/55/8316   Follicular lymphoma of intra-abdominal lymph nodes ([place]) 02/15/2019   Goals of care, counseling/discussion 02/15/2019   DVT (deep venous thrombosis) ([place]) 02/12/2019   Prostatitis 74/25/5258   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[place]) 02/06/2019   Other pulmonary embolism with acute cor pulmonale ([place]) 02/06/2019   Type 2 diabetes mellitus without complication, without long-term current use of insulin ([place]) 02/06/2019   Massive pulmonary hemorrhage originating in perinatal period 01/29/2019   PCP:  Rusty Aus, MD Pharmacy:   [place], [place] [facility] [address] [place] [address] Phone: [phone] Fax: [phone]  [id number] [place] [place]Phone: [phone]: [phone]    Social Determinants of Health (SLoving Interventions    Readmission Risk Interventions Readmission Risk Prevention Plan 01/17/2021  Transportation Screening Complete  PCP or Specialist Appt within 3-5 Days Complete  Social Work Consult for RWilderPlanning/Counseling Complete  Palliative Care Screening Not Applicable  Medication Review (Press photographer Complete  Some recent data might be hidden

## 2021-01-17 NOTE — Progress Notes (Signed)
PT Cancellation Note  Patient Details Name: Edward Atkinson MRN: [id number] DOB: [birth year]   Cancelled Treatment:    Reason Eval/Treat Not Completed: Medical issues which prohibited therapy. Upon arrival to room pt with resting HR of 170 bpm (contraindicated for PT). RN informed. PT to re-attempt at later time/date as pt medically appropriate.   PT orders received, chart reviewed. Pt noted to have elevated resting HR this AM ( bpm) PT intervention not appropriate at this time (exertional activity contraindicated 2/2 elevated resting HR). Will f/u as pt is medically stable.   Ricard Dillon PT, DPT  01/17/2021, 10:10 AM

## 2021-01-17 NOTE — Progress Notes (Signed)
Pulmonary Medicine          Date: 01/17/2021,   MRN# [id number] Edward Atkinson 08-Jul-1960     AdmissionWeight: 63.5 kg                 CurrentWeight: 70.2 kg   Referring physician: Dr Roderic Palau   CHIEF COMPLAINT:   Acute on chronic hypoxemic respiratory failure   HISTORY OF PRESENT ILLNESS   60 yo M w/hx of chronic hypoxemia and ILD post COVID19, T2DM, PE on eliquis, Lymphoma s/p Rituxan which is completed.  Came in due to worsening SOB and DOE specifically mild exertional defacation/urination with feelings of presyncope and severe dyspnea which started to get worse post tapering from steroid appx few days prior to onset of symptom progression.  He was placed on solumedrol and reported mild improvement.  He is on 10L /min [employer] during my evaluation.  He had CT chest done with PE protocol and noted to have bilateral GGO infiltrates worse compared to June study. PCCM consultation for further evaluation and management.     01/17/21-  patient seems to be slighly clinically worse post reduction of IV steroids yesterday indicative of ongoing ILD exacerbation, thus far infectious workup is negative but inflammatory biomarkers are highly elevated also pointing towards ILD exacerbation.  There is new CXR in process this morning for interval changes. We discussed increasing steroids slightly and giving more time.  I will also initiate steroid sparing immunomodulation with BID cellcept as patient will likely need therapy for prolonged time period.  Compensatory tachycardia noted appreciate cardiology team.  Sugars with peaks and valleys with adjusted steroid therapy.  Patient with muscle wasting and protein calorie malnutrition but eating better this am.   PAST MEDICAL HISTORY   Past Medical History:  Diagnosis Date  . Chronic respiratory failure with hypoxia (HCC)    2L [place] continuous  . COVID-19   . Diabetes mellitus without complication ([place])   . Diverticulitis   . Follicular lymphoma  of intra-abdominal lymph nodes ([place]) 02/15/2019  . ILD (interstitial lung disease) ([place])      SURGICAL HISTORY   Past Surgical History:  Procedure Laterality Date  . CHOLECYSTECTOMY    . COLON RESECTION     DUE TO DIVERTICULITIS  . FLEXIBLE BRONCHOSCOPY N/A 12/02/2019   Procedure: FLEXIBLE BRONCHOSCOPY;  Surgeon: Ottie Glazier, MD;  Location: [facility];  Service: Thoracic;  Laterality: N/A;  . PORTA CATH INSERTION N/A 03/11/2019   Procedure: PORTA CATH INSERTION;  Surgeon: Algernon Huxley, MD;  Location: [place] [facility];  Service: Cardiovascular;  Laterality: N/A;  . PULMONARY THROMBECTOMY N/A 01/29/2019   Procedure: PULMONARY THROMBECTOMY;  Surgeon: Algernon Huxley, MD;  Location: [place] [facility];  Service: Cardiovascular;  Laterality: N/A;     FAMILY HISTORY   Family History  Problem Relation Age of Onset  . Diabetes Brother      SOCIAL HISTORY   Social History   Tobacco Use  . Smoking status: Never  . Smokeless tobacco: Never  Vaping Use  . Vaping Use: Never used  Substance Use Topics  . Alcohol use: Yes    Alcohol/week: 0.0 - 2.0 standard drinks    Comment: occasional  . Drug use: Never     MEDICATIONS    Home Medication:    Current Medication:  Current Facility-Administered Medications:  .  acetaminophen (TYLENOL) tablet 650 mg, 650 mg, Oral, Q6H PRN **OR** acetaminophen (TYLENOL) suppository 650 mg, 650 mg, Rectal, Q6H  PRN, Howerter, Justin B, DO .  acyclovir (ZOVIRAX) 200 MG capsule 400 mg, 400 mg, Oral, BID, Memon, Jehanzeb, MD, 400 mg at 01/16/21 2118 .  apixaban (ELIQUIS) tablet 2.5 mg, 2.5 mg, Oral, BID, Howerter, Justin B, DO, 2.5 mg at 01/16/21 2118 .  azithromycin (ZITHROMAX) 500 mg in sodium chloride 0.9 % 250 mL IVPB, 500 mg, Intravenous, Q24H, Howerter, Justin B, DO, Stopped at 01/16/21 2043 .  ceFEPIme (MAXIPIME) 2 g in sodium chloride 0.9 % 100 mL IVPB, 2 g, Intravenous, Q8H, Dorothe Pea, RPH, Stopped at 01/17/21 0537 .   Chlorhexidine Gluconate Cloth 2 % PADS 6 each, 6 each, Topical, Daily, Kathie Dike, MD, 6 each at 01/16/21 0955 .  digoxin (LANOXIN) tablet 0.125 mg, 0.125 mg, Oral, Daily, Memon, Jolaine Artist, MD, 0.125 mg at 01/16/21 0952 .  feeding supplement (NEPRO CARB STEADY) liquid 237 mL, 237 mL, Oral, TID BM, Kathie Dike, MD, 237 mL at 01/16/21 1946 .  guaiFENesin-dextromethorphan (ROBITUSSIN DM) 100-10 MG/5ML syrup 5 mL, 5 mL, Oral, Q4H PRN, Kathie Dike, MD, 5 mL at 01/17/21 0648 .  insulin aspart (novoLOG) injection 0-20 Units, 0-20 Units, Subcutaneous, TID WC, Kathie Dike, MD, 4 Units at 01/16/21 1807 .  insulin aspart (novoLOG) injection 0-5 Units, 0-5 Units, Subcutaneous, QHS, Kathie Dike, MD, 3 Units at 01/16/21 2138 .  insulin aspart (novoLOG) injection 20 Units, 20 Units, Subcutaneous, Once, Memon, Jehanzeb, MD .  insulin aspart (novoLOG) injection 5 Units, 5 Units, Subcutaneous, TID WC, Kathie Dike, MD, 5 Units at 01/16/21 1806 .  insulin glargine-yfgn (SEMGLEE) injection 20 Units, 20 Units, Subcutaneous, Daily, Memon, Jehanzeb, MD .  levalbuterol (XOPENEX) nebulizer solution 1.25 mg, 1.25 mg, Nebulization, Q4H PRN, Howerter, Justin B, DO .  methylPREDNISolone sodium succinate (SOLU-MEDROL) 125 mg/2 mL injection 50 mg, 50 mg, Intravenous, Q12H, Chiara Coltrin, MD .  multivitamin with minerals tablet 1 tablet, 1 tablet, Oral, Daily, Kathie Dike, MD, 1 tablet at 01/16/21 0952 .  mycophenolate (CELLCEPT) capsule 250 mg, 250 mg, Oral, BID, Demaree Liberto, MD .  sodium chloride flush (NS) 0.9 % injection 10-40 mL, 10-40 mL, Intracatheter, Q12H, Memon, Jolaine Artist, MD, 10 mL at 01/16/21 2122 .  sodium chloride flush (NS) 0.9 % injection 10-40 mL, 10-40 mL, Intracatheter, PRN, Kathie Dike, MD  Facility-Administered Medications Ordered in Other Encounters:  .  heparin lock flush 100 unit/mL, 500 Units, Intravenous, Once, Earlie Server, MD    ALLERGIES    Penicillins     REVIEW OF SYSTEMS    Review of Systems:  Gen:  Denies  fever, sweats, chills weigh loss  HEENT: Denies blurred vision, double vision, ear pain, eye pain, hearing loss, nose bleeds, sore throat Cardiac:  No dizziness, chest pain or heaviness, chest tightness,edema Resp:   Denies cough or sputum porduction, shortness of breath,wheezing, hemoptysis,  Gi: Denies swallowing difficulty, stomach pain, nausea or vomiting, diarrhea, constipation, bowel incontinence Gu:  Denies bladder incontinence, burning urine Ext:   Denies Joint pain, stiffness or swelling Skin: Denies  skin rash, easy bruising or bleeding or hives Endoc:  Denies polyuria, polydipsia , polyphagia or weight change Psych:   Denies depression, insomnia or hallucinations   Other:  All other systems negative   VS: BP 113/75 (BP Location: Right Arm)   Pulse 74   Temp 98.2 F (36.8 C) (Oral)   Resp (!) 23   Ht 6' (1.829 m)   Wt 70.2 kg   SpO2 91%   BMI 20.99 kg/m      PHYSICAL  EXAM    GENERAL:NAD, no fevers, chills, no weakness no fatigue HEAD: Normocephalic, atraumatic.  EYES: Pupils equal, round, reactive to light. Extraocular muscles intact. No scleral icterus.  MOUTH: Moist mucosal membrane. Dentition intact. No abscess noted.  EAR, NOSE, THROAT: Clear without exudates. No external lesions.  NECK: Supple. No thyromegaly. No nodules. No JVD.  PULMONARY: Bilateral rhonchi CARDIOVASCULAR: S1 and S2. Regular rate and rhythm. No murmurs, rubs, or gallops. No edema. Pedal pulses 2+ bilaterally.  GASTROINTESTINAL: Soft, nontender, nondistended. No masses. Positive bowel sounds. No hepatosplenomegaly.  MUSCULOSKELETAL: No swelling, clubbing, or edema. Range of motion full in all extremities.  NEUROLOGIC: Cranial nerves II through XII are intact. No gross focal neurological deficits. Sensation intact. Reflexes intact.  SKIN: No ulceration, lesions, rashes, or cyanosis. Skin warm and dry. Turgor  intact.  PSYCHIATRIC: Mood, affect within normal limits. The patient is awake, alert and oriented x 3. Insight, judgment intact.       IMAGING    CT Angio Chest PE W/Cm &/Or Wo Cm  Result Date: 01/13/2021 CLINICAL DATA:  Respiratory distress, hypoxia EXAM: CT ANGIOGRAPHY CHEST WITH CONTRAST TECHNIQUE: Multidetector CT imaging of the chest was performed using the standard protocol during bolus administration of intravenous contrast. Multiplanar CT image reconstructions and MIPs were obtained to evaluate the vascular anatomy. CONTRAST:  68m OMNIPAQUE IOHEXOL 350 MG/ML SOLN COMPARISON:  10/26/2020, 01/13/2021 FINDINGS: Cardiovascular: This is a technically adequate evaluation of the pulmonary vasculature. No filling defects or pulmonary emboli. The heart is unremarkable without pericardial effusion. No evidence of thoracic aortic aneurysm or dissection. Right chest wall port via internal jugular approach tip within the superior vena cava. Mediastinum/Nodes: No pathologic adenopathy within the mediastinum, hila, or axilla. Thyroid, trachea, and esophagus are unremarkable. Lungs/Pleura: There has been interval progression of the multifocal ground-glass airspace disease seen previously. Changes are most pronounced within the dependent lower lobes. No effusion or pneumothorax. The central airways are patent, with persistent bronchiectasis. Upper Abdomen: No acute abnormality. Musculoskeletal: No acute or destructive bony lesions. Reconstructed images demonstrate no additional findings. Review of the MIP images confirms the above findings. IMPRESSION: 1. No evidence of pulmonary embolus. 2. Progressive multifocal bilateral ground-glass airspace disease, greatest at the lung bases. The differential diagnosis would include progressive postinflammatory scarring and fibrosis, multifocal atypical pneumonia, or hypersensitivity/drug toxicity. 3. Stable bronchiectasis. Electronically Signed   By: MRanda NgoM.D.    On: 01/13/2021 17:13   DG Chest Port 1 View  Result Date: 01/14/2021 CLINICAL DATA:  Shortness of breath.  History of cancer.  Diabetes. EXAM: PORTABLE CHEST 1 VIEW.  Patient is rotated. COMPARISON:  Chest x-ray 01/13/2021, CT chest 01/13/2021 FINDINGS: Accessed right chest wall Port-A-Cath in stable position. The heart and mediastinal contours are within normal limits. Low lung volumes with persistent diffuse patchy airspace opacities. No pleural effusion. No pneumothorax. No acute osseous abnormality. Right upper quadrant surgical clips. IMPRESSION: Low lung volumes with persistent diffuse patchy airspace opacities. Electronically Signed   By: MIven FinnM.D.   On: 01/14/2021 21:08   DG Chest Port 1 View  Result Date: 01/13/2021 CLINICAL DATA:  Shortness of breath EXAM: PORTABLE CHEST 1 VIEW COMPARISON:  Chest radiograph 07/29/2019 FINDINGS: There is a right chest wall port with the tip terminating in the lower SVC/cavoatrial junction. The cardiomediastinal silhouette is within normal limits. Lung volumes are low with unchanged asymmetric elevation of the right hemidiaphragm. There are increased interstitial markings with diffuse reticular opacities throughout both lungs. There is no focal consolidation.  There is no significant pleural effusion. There is no pneumothorax. There is no acute osseous abnormality. There is gaseous distention of the stomach and large bowel in the right upper quadrant. IMPRESSION: Low lung volumes with suspected mild pulmonary interstitial edema. Atypical/viral infection could have a similar appearance. Electronically Signed   By: Valetta Mole M.D.   On: 01/13/2021 14:24      ASSESSMENT/PLAN   Acute hypoxemic respiratory failure - present on admission  - COVID19 negative   - supplemental O2 during my evaluation 10l/min>>8L -Respiratory viral panel-negative  -serum fungitell- -legionella ab -nasal MRSA PCR-negative -Procalcitonin trend reviewed -strep  pneumoniae ur AG -Histoplasma Ur Ag -sputum resp cultures -AFB sputum expectorated specimen -sputum cytology  -Agree with Cefepime empirically  -Reduced Solumedrol to 40 BID - 01/15/21>>50 -reviewed pertinent imaging with patient today - ESR and CRP are elevated  -PT/OT for d/c planning  -please encourage patient to use incentive spirometer few times each hour while hospitalized.   -started Cellcept 250 bid to allow steroid sparing immunomodulation and weaning of steroids     Acute blood loss anemia    - noted Hb trending down from 14 to 10 -    -fecal occult blood test    - may be due to GI bleed due to eliquis - monitor for grossly visible bleeding and stable vital sings    Thank you for allowing me to participate in the care of this patient.  Total face to face encounter time for this patient visit was >56mn. >50% of the time was  spent in counseling and coordination of care.   Patient/Family are satisfied with care plan and all questions have been answered.  This document was prepared using Dragon voice recognition software and may include unintentional dictation errors.     FOttie Glazier M.D.  [place] of [place]

## 2021-01-17 NOTE — Progress Notes (Signed)
[facility]  SUBJECTIVE: Patient laying in bed, denies chest pain or palpitations, does report pulmonary congestion   Vitals:   01/17/21 0400 01/17/21 0500 01/17/21 0737 01/17/21 0913  BP: 134/90  113/75   Pulse: (!) 123  74 (!) 138  Resp: (!) 35  (!) 23   Temp: 98 F (36.7 C)  98.2 F (36.8 C)   TempSrc: Oral  Oral   SpO2: 99%  91%   Weight:  70.2 kg    Height:         Intake/Output Summary (Last 24 hours) at 01/17/2021 1009 Last data filed at 01/17/2021 1007 Gross per 24 hour  Intake 1985.83 ml  Output 2225 ml  Net -239.17 ml      PHYSICAL EXAM  General: Well developed, well nourished, in no acute distress HEENT:  Normocephalic and atramatic Neck:  No JVD.  Lungs: Clear bilaterally to auscultation and percussion. Heart: HRRR . Normal S1 and S2 without gallops or murmurs.  Abdomen: Bowel sounds are positive, abdomen soft and non-tender  Msk:  Back normal, normal gait. Normal strength and tone for age. Extremities: No clubbing, cyanosis or edema.   Neuro: Alert and oriented X 3. Psych:  Good affect, responds appropriately   LABS: Basic Metabolic Panel: Recent Labs    01/15/21 0440 01/16/21 0610  NA 134* 137  K 3.9 4.2  CL 99 101  CO2 28 28  GLUCOSE 245* 253*  BUN 19 19  CREATININE 0.61 0.55*  CALCIUM 8.7* 8.3*  MG  --  2.1  PHOS  --  3.3   Liver Function Tests: No results for input(s): AST, ALT, ALKPHOS, BILITOT, PROT, ALBUMIN in the last 72 hours. No results for input(s): LIPASE, AMYLASE in the last 72 hours. CBC: Recent Labs    01/15/21 0440 01/16/21 0610  WBC 7.0 10.3  HGB 10.2* 10.3*  HCT 32.0* 32.8*  MCV 82.5 84.1  PLT 332 363   Cardiac Enzymes: No results for input(s): CKTOTAL, CKMB, CKMBINDEX, TROPONINI in the last 72 hours. BNP: Invalid input(s): POCBNP D-Dimer: No results for input(s): DDIMER in the last 72 hours. Hemoglobin A1C: Recent Labs    01/14/21 1621  HGBA1C 6.6*   Fasting Lipid Panel: No results for input(s):  CHOL, HDL, LDLCALC, TRIG, CHOLHDL, LDLDIRECT in the last 72 hours. Thyroid Function Tests: No results for input(s): TSH, T4TOTAL, T3FREE, THYROIDAB in the last 72 hours.  Invalid input(s): FREET3 Anemia Panel: No results for input(s): VITAMINB12, FOLATE, FERRITIN, TIBC, IRON, RETICCTPCT in the last 72 hours.  DG Chest Port 1 View  Result Date: 01/17/2021 CLINICAL DATA:  Worsening shortness of breath. EXAM: PORTABLE CHEST 1 VIEW COMPARISON:  01/14/2021 FINDINGS: 0826 hours. Low volume film. Patchy airspace disease again noted in both lungs, left greater than right. Cardiopericardial silhouette is at upper limits of normal for size. Right Port-A-Cath again noted. Telemetry leads overlie the chest. IMPRESSION: Low volume film with patchy bilateral airspace disease, left greater than right. No substantial change. Electronically Signed   By: Misty Stanley M.D.   On: 01/17/2021 08:53     Echo LVEF 60 to 65% by 2D echocardiogram 01/30/2019  TELEMETRY: Atrial fibrillation at 163 bpm:  ASSESSMENT AND PLAN:  Principal Problem:   Acute on chronic respiratory failure with hypoxia (HCC) Active Problems:   Type 2 diabetes mellitus without complication, without long-term current use of insulin (HCC)   SOB (shortness of breath)   Interstitial lung disease (HCC)   Severe sepsis ([place])   CAP (community  acquired pneumonia)   Elevated troponin   Protein calorie malnutrition (HCC)   Protein-calorie malnutrition, severe    1. Atrial fibrillation with rapid ventricular rate, modest improvement after digoxin IV load, compensatory for respiratory failure, patient asymptomatic.  Heart rate up this morning due to pulmonary congestion.  Patient on Eliquis with history of pulmonary embolus. 2.  Respiratory failure, and patient with COVID-19 interstitial lung disease, slowly improving 3.  History of pulmonary embolus, on Eliquis 4.  Borderline elevated high-sensitivity troponin (13, 22, 8), in the absence of  chest pain or ischemic ECG changes, very likely due to demand supply ischemia in setting of atrial fibrillation with rapid ventricular rate and respiratory failure   Recommendations   1.  Agree with overall current therapy 2.  Continue Eliquis for stroke prevention 3.  Continue digoxin 0.125 mg p.o. daily for rate control 4.  Defer amiodarone in light of patient's history of interstitial lung disease 5.  Add Cardizem CD 120 mg daily   Isaias Cowman, MD, PhD, [facility] 01/17/2021 10:09 AM

## 2021-01-18 ENCOUNTER — Inpatient Hospital Stay: Payer: BC Managed Care – PPO

## 2021-01-18 DIAGNOSIS — R778 Other specified abnormalities of plasma proteins: Secondary | ICD-10-CM | POA: Diagnosis not present

## 2021-01-18 DIAGNOSIS — J849 Interstitial pulmonary disease, unspecified: Secondary | ICD-10-CM | POA: Diagnosis not present

## 2021-01-18 DIAGNOSIS — J189 Pneumonia, unspecified organism: Secondary | ICD-10-CM | POA: Diagnosis not present

## 2021-01-18 DIAGNOSIS — J9621 Acute and chronic respiratory failure with hypoxia: Secondary | ICD-10-CM | POA: Diagnosis not present

## 2021-01-18 LAB — CBC
HCT: 35.2 % — ABNORMAL LOW (ref 39.0–52.0)
Hemoglobin: 11.2 g/dL — ABNORMAL LOW (ref 13.0–17.0)
MCH: 27.1 pg (ref 26.0–34.0)
MCHC: 31.8 g/dL (ref 30.0–36.0)
MCV: 85 fL (ref 80.0–100.0)
Platelets: 333 10*3/uL (ref 150–400)
RBC: 4.14 MIL/uL — ABNORMAL LOW (ref 4.22–5.81)
RDW: 16.7 % — ABNORMAL HIGH (ref 11.5–15.5)
WBC: 12.4 10*3/uL — ABNORMAL HIGH (ref 4.0–10.5)
nRBC: 0 % (ref 0.0–0.2)

## 2021-01-18 LAB — BASIC METABOLIC PANEL
Anion gap: 7 (ref 5–15)
BUN: 23 mg/dL (ref 8–23)
CO2: 33 mmol/L — ABNORMAL HIGH (ref 22–32)
Calcium: 8.9 mg/dL (ref 8.9–10.3)
Chloride: 97 mmol/L — ABNORMAL LOW (ref 98–111)
Creatinine, Ser: 0.58 mg/dL — ABNORMAL LOW (ref 0.61–1.24)
GFR, Estimated: >60 mL/min (ref >60)
Glucose, Bld: 94 mg/dL (ref 70–99)
Potassium: 4 mmol/L (ref 3.5–5.1)
Sodium: 137 mmol/L (ref 135–145)

## 2021-01-18 LAB — CULTURE, BLOOD (ROUTINE X 2)
Culture: NO GROWTH
Culture: NO GROWTH
Special Requests: ADEQUATE
Special Requests: ADEQUATE

## 2021-01-18 LAB — GLUCOSE, CAPILLARY
Glucose-Capillary: 124 mg/dL — ABNORMAL HIGH (ref 70–99)
Glucose-Capillary: 127 mg/dL — ABNORMAL HIGH (ref 70–99)
Glucose-Capillary: 343 mg/dL — ABNORMAL HIGH (ref 70–99)
Glucose-Capillary: 269 mg/dL — ABNORMAL HIGH (ref 70–99)

## 2021-01-18 MED ORDER — METHYLPREDNISOLONE SODIUM SUCC 125 MG IJ SOLR
60.0000 mg | Freq: Two times a day (BID) | INTRAMUSCULAR | Status: DC
  Administered 2021-01-18 – 2021-01-19 (×2): 60 mg via INTRAVENOUS
  Filled 2021-01-18 (×2): qty 2

## 2021-01-18 MED ORDER — DILTIAZEM HCL ER COATED BEADS 180 MG PO CP24
180.0000 mg | ORAL_CAPSULE | Freq: Every day | ORAL | Status: DC
Start: 2021-01-18 — End: 2021-02-06
  Administered 2021-01-18 – 2021-02-06 (×20): 180 mg via ORAL
  Filled 2021-01-18 (×20): qty 1

## 2021-01-18 MED ORDER — DILTIAZEM HCL 25 MG/5ML IV SOLN
10.0000 mg | Freq: Once | INTRAVENOUS | Status: AC
  Administered 2021-01-18: 10 mg via INTRAVENOUS
  Filled 2021-01-18: qty 5

## 2021-01-18 NOTE — Progress Notes (Signed)
Pulmonary Medicine          Date: 01/18/2021,   MRN# [id number] Edward Atkinson [birth year]     AdmissionWeight: 63.5 kg                 CurrentWeight: 71.9 kg   Referring physician: Dr Roderic Palau   CHIEF COMPLAINT:   Acute on chronic hypoxemic respiratory failure   HISTORY OF PRESENT ILLNESS   60 yo M w/hx of chronic hypoxemia and ILD post COVID19, T2DM, PE on eliquis, Lymphoma s/p Rituxan which is completed.  Came in due to worsening SOB and DOE specifically mild exertional defacation/urination with feelings of presyncope and severe dyspnea which started to get worse post tapering from steroid appx few days prior to onset of symptom progression.  He was placed on solumedrol and reported mild improvement.  He is on 10L /min [place] during my evaluation.  He had CT chest done with PE protocol and noted to have bilateral GGO infiltrates worse compared to June study. PCCM consultation for further evaluation and management.     01/17/21-  patient seems to be slighly clinically worse post reduction of IV steroids yesterday indicative of ongoing ILD exacerbation, thus far infectious workup is negative but inflammatory biomarkers are highly elevated also pointing towards ILD exacerbation.  There is new CXR in process this morning for interval changes. We discussed increasing steroids slightly and giving more time.  I will also initiate steroid sparing immunomodulation with BID cellcept as patient will likely need therapy for prolonged time period.  Compensatory tachycardia noted appreciate cardiology team.  Sugars with peaks and valleys with adjusted steroid therapy.  Patient with muscle wasting and protein calorie malnutrition but eating better this am.  01/18/21- patient is on HFNC at 60%, feels better, has not been able to tolerate BIPAP.  Wife is at bedside.  BP is borderline low MAP 76.  He has negative infectious workup and thus far ILD exacerbation seems to be main differential. He  continues to have peaks and valleys in blood glucose.  We started steroid sparing regimen with cellcept bid 250 mg and will increase dose as we taper off prednisone.  Remains tachyarrtythmic but improved. Appreciate everyone involved.    PAST MEDICAL HISTORY   Past Medical History:  Diagnosis Date  . Chronic respiratory failure with hypoxia (HCC)    2L [place] continuous  . COVID-19   . Diabetes mellitus without complication ([place])   . Diverticulitis   . Follicular lymphoma of intra-abdominal lymph nodes ([place]) 02/15/2019  . ILD (interstitial lung disease) ([place])      SURGICAL HISTORY   Past Surgical History:  Procedure Laterality Date  . CHOLECYSTECTOMY    . COLON RESECTION     DUE TO DIVERTICULITIS  . FLEXIBLE BRONCHOSCOPY N/A 12/02/2019   Procedure: FLEXIBLE BRONCHOSCOPY;  Surgeon: Ottie Glazier, MD;  Location: [facility];  Service: Thoracic;  Laterality: N/A;  . PORTA CATH INSERTION N/A 03/11/2019   Procedure: PORTA CATH INSERTION;  Surgeon: Algernon Huxley, MD;  Location: [facility];  Service: Cardiovascular;  Laterality: N/A;  . PULMONARY THROMBECTOMY N/A 01/29/2019   Procedure: PULMONARY THROMBECTOMY;  Surgeon: Algernon Huxley, MD;  Location: [place] [facility];  Service: Cardiovascular;  Laterality: N/A;     FAMILY HISTORY   Family History  Problem Relation Age of Onset  . Diabetes Brother      SOCIAL HISTORY   Social History   Tobacco Use  . Smoking status: Never  .  Smokeless tobacco: Never  Vaping Use  . Vaping Use: Never used  Substance Use Topics  . Alcohol use: Yes    Alcohol/week: 0.0 - 2.0 standard drinks    Comment: occasional  . Drug use: Never     MEDICATIONS    Home Medication:    Current Medication:  Current Facility-Administered Medications:  .  acetaminophen (TYLENOL) tablet 650 mg, 650 mg, Oral, Q6H PRN, 650 mg at 01/18/21 1411 **OR** acetaminophen (TYLENOL) suppository 650 mg, 650 mg, Rectal, Q6H PRN, Howerter, Justin B, DO .   acyclovir (ZOVIRAX) 200 MG capsule 400 mg, 400 mg, Oral, BID, Memon, Jehanzeb, MD, 400 mg at 01/18/21 0910 .  apixaban (ELIQUIS) tablet 2.5 mg, 2.5 mg, Oral, BID, Howerter, Justin B, DO, 2.5 mg at 01/18/21 0910 .  azithromycin (ZITHROMAX) 500 mg in sodium chloride 0.9 % 250 mL IVPB, 500 mg, Intravenous, Q24H, Howerter, Justin B, DO, Stopped at 01/17/21 2008 .  ceFEPIme (MAXIPIME) 2 g in sodium chloride 0.9 % 100 mL IVPB, 2 g, Intravenous, Q8H, Dolan, Carissa E, RPH, Last Rate: 200 mL/hr at 01/18/21 1408, 2 g at 01/18/21 1408 .  Chlorhexidine Gluconate Cloth 2 % PADS 6 each, 6 each, Topical, Daily, Kathie Dike, MD, 6 each at 01/18/21 0911 .  digoxin (LANOXIN) tablet 0.125 mg, 0.125 mg, Oral, Daily, Memon, Jehanzeb, MD, 0.125 mg at 01/18/21 0909 .  diltiazem (CARDIZEM CD) 24 hr capsule 180 mg, 180 mg, Oral, Daily, Paraschos, Alexander, MD, 180 mg at 01/18/21 0910 .  feeding supplement (NEPRO CARB STEADY) liquid 237 mL, 237 mL, Oral, TID BM, Memon, Jehanzeb, MD, 237 mL at 01/18/21 1401 .  guaiFENesin (MUCINEX) 12 hr tablet 1,200 mg, 1,200 mg, Oral, BID, Memon, Jehanzeb, MD, 1,200 mg at 01/18/21 0910 .  guaiFENesin-dextromethorphan (ROBITUSSIN DM) 100-10 MG/5ML syrup 5 mL, 5 mL, Oral, Q4H PRN, Kathie Dike, MD, 5 mL at 01/17/21 0648 .  insulin aspart (novoLOG) injection 0-20 Units, 0-20 Units, Subcutaneous, TID WC, Kathie Dike, MD, 15 Units at 01/18/21 1614 .  insulin aspart (novoLOG) injection 0-5 Units, 0-5 Units, Subcutaneous, QHS, Kathie Dike, MD, 3 Units at 01/16/21 2138 .  insulin aspart (novoLOG) injection 5 Units, 5 Units, Subcutaneous, TID WC, Kathie Dike, MD, 5 Units at 01/18/21 1613 .  insulin glargine-yfgn (SEMGLEE) injection 20 Units, 20 Units, Subcutaneous, Daily, Kathie Dike, MD, 20 Units at 01/18/21 0910 .  levalbuterol (XOPENEX) nebulizer solution 1.25 mg, 1.25 mg, Nebulization, Q4H PRN, Howerter, Justin B, DO, 1.25 mg at 01/17/21 2011 .  methylPREDNISolone sodium  succinate (SOLU-MEDROL) 125 mg/2 mL injection 100 mg, 100 mg, Intravenous, Q24H, Kashmir Lysaght, MD, 100 mg at 01/18/21 0910 .  multivitamin with minerals tablet 1 tablet, 1 tablet, Oral, Daily, Kathie Dike, MD, 1 tablet at 01/18/21 0910 .  mycophenolate (CELLCEPT) capsule 250 mg, 250 mg, Oral, BID, Ottie Glazier, MD, 250 mg at 01/18/21 0909 .  sodium chloride flush (NS) 0.9 % injection 10-40 mL, 10-40 mL, Intracatheter, Q12H, Memon, Jolaine Artist, MD, 20 mL at 01/18/21 0911 .  sodium chloride flush (NS) 0.9 % injection 10-40 mL, 10-40 mL, Intracatheter, PRN, Kathie Dike, MD  Facility-Administered Medications Ordered in Other Encounters:  .  heparin lock flush 100 unit/mL, 500 Units, Intravenous, Once, Earlie Server, MD    ALLERGIES   Penicillins     REVIEW OF SYSTEMS    Review of Systems:  Gen:  Denies  fever, sweats, chills weigh loss  HEENT: Denies blurred vision, double vision, ear pain, eye pain, hearing loss, nose bleeds,  sore throat Cardiac:  No dizziness, chest pain or heaviness, chest tightness,edema Resp:   Denies cough or sputum porduction, shortness of breath,wheezing, hemoptysis,  Gi: Denies swallowing difficulty, stomach pain, nausea or vomiting, diarrhea, constipation, bowel incontinence Gu:  Denies bladder incontinence, burning urine Ext:   Denies Joint pain, stiffness or swelling Skin: Denies  skin rash, easy bruising or bleeding or hives Endoc:  Denies polyuria, polydipsia , polyphagia or weight change Psych:   Denies depression, insomnia or hallucinations   Other:  All other systems negative   VS: BP 102/67 (BP Location: Left Arm)   Pulse 78   Temp 98.2 F (36.8 C) (Oral)   Resp 20   Ht 6' (1.829 m)   Wt 71.9 kg   SpO2 98%   BMI 21.50 kg/m      PHYSICAL EXAM    GENERAL:NAD, no fevers, chills, no weakness no fatigue HEAD: Normocephalic, atraumatic.  EYES: Pupils equal, round, reactive to light. Extraocular muscles intact. No scleral icterus.   MOUTH: Moist mucosal membrane. Dentition intact. No abscess noted.  EAR, NOSE, THROAT: Clear without exudates. No external lesions.  NECK: Supple. No thyromegaly. No nodules. No JVD.  PULMONARY: Bilateral rhonchi CARDIOVASCULAR: S1 and S2. Regular rate and rhythm. No murmurs, rubs, or gallops. No edema. Pedal pulses 2+ bilaterally.  GASTROINTESTINAL: Soft, nontender, nondistended. No masses. Positive bowel sounds. No hepatosplenomegaly.  MUSCULOSKELETAL: No swelling, clubbing, or edema. Range of motion full in all extremities.  NEUROLOGIC: Cranial nerves II through XII are intact. No gross focal neurological deficits. Sensation intact. Reflexes intact.  SKIN: No ulceration, lesions, rashes, or cyanosis. Skin warm and dry. Turgor intact.  PSYCHIATRIC: Mood, affect within normal limits. The patient is awake, alert and oriented x 3. Insight, judgment intact.       IMAGING    CT Angio Chest PE W/Cm &/Or Wo Cm  Result Date: 01/13/2021 CLINICAL DATA:  Respiratory distress, hypoxia EXAM: CT ANGIOGRAPHY CHEST WITH CONTRAST TECHNIQUE: Multidetector CT imaging of the chest was performed using the standard protocol during bolus administration of intravenous contrast. Multiplanar CT image reconstructions and MIPs were obtained to evaluate the vascular anatomy. CONTRAST:  80m OMNIPAQUE IOHEXOL 350 MG/ML SOLN COMPARISON:  10/26/2020, 01/13/2021 FINDINGS: Cardiovascular: This is a technically adequate evaluation of the pulmonary vasculature. No filling defects or pulmonary emboli. The heart is unremarkable without pericardial effusion. No evidence of thoracic aortic aneurysm or dissection. Right chest wall port via internal jugular approach tip within the superior vena cava. Mediastinum/Nodes: No pathologic adenopathy within the mediastinum, hila, or axilla. Thyroid, trachea, and esophagus are unremarkable. Lungs/Pleura: There has been interval progression of the multifocal ground-glass airspace disease seen  previously. Changes are most pronounced within the dependent lower lobes. No effusion or pneumothorax. The central airways are patent, with persistent bronchiectasis. Upper Abdomen: No acute abnormality. Musculoskeletal: No acute or destructive bony lesions. Reconstructed images demonstrate no additional findings. Review of the MIP images confirms the above findings. IMPRESSION: 1. No evidence of pulmonary embolus. 2. Progressive multifocal bilateral ground-glass airspace disease, greatest at the lung bases. The differential diagnosis would include progressive postinflammatory scarring and fibrosis, multifocal atypical pneumonia, or hypersensitivity/drug toxicity. 3. Stable bronchiectasis. Electronically Signed   By: MRanda NgoM.D.   On: 01/13/2021 17:13   DG Chest Port 1 View  Result Date: 01/17/2021 CLINICAL DATA:  Worsening shortness of breath. EXAM: PORTABLE CHEST 1 VIEW COMPARISON:  01/14/2021 FINDINGS: 0826 hours. Low volume film. Patchy airspace disease again noted in both lungs, left greater than  right. Cardiopericardial silhouette is at upper limits of normal for size. Right Port-A-Cath again noted. Telemetry leads overlie the chest. IMPRESSION: Low volume film with patchy bilateral airspace disease, left greater than right. No substantial change. Electronically Signed   By: Misty Stanley M.D.   On: 01/17/2021 08:53   DG Chest Port 1 View  Result Date: 01/14/2021 CLINICAL DATA:  Shortness of breath.  History of cancer.  Diabetes. EXAM: PORTABLE CHEST 1 VIEW.  Patient is rotated. COMPARISON:  Chest x-ray 01/13/2021, CT chest 01/13/2021 FINDINGS: Accessed right chest wall Port-A-Cath in stable position. The heart and mediastinal contours are within normal limits. Low lung volumes with persistent diffuse patchy airspace opacities. No pleural effusion. No pneumothorax. No acute osseous abnormality. Right upper quadrant surgical clips. IMPRESSION: Low lung volumes with persistent diffuse patchy  airspace opacities. Electronically Signed   By: Iven Finn M.D.   On: 01/14/2021 21:08   DG Chest Port 1 View  Result Date: 01/13/2021 CLINICAL DATA:  Shortness of breath EXAM: PORTABLE CHEST 1 VIEW COMPARISON:  Chest radiograph 07/29/2019 FINDINGS: There is a right chest wall port with the tip terminating in the lower SVC/cavoatrial junction. The cardiomediastinal silhouette is within normal limits. Lung volumes are low with unchanged asymmetric elevation of the right hemidiaphragm. There are increased interstitial markings with diffuse reticular opacities throughout both lungs. There is no focal consolidation. There is no significant pleural effusion. There is no pneumothorax. There is no acute osseous abnormality. There is gaseous distention of the stomach and large bowel in the right upper quadrant. IMPRESSION: Low lung volumes with suspected mild pulmonary interstitial edema. Atypical/viral infection could have a similar appearance. Electronically Signed   By: Valetta Mole M.D.   On: 01/13/2021 14:24      ASSESSMENT/PLAN   Acute hypoxemic respiratory failure - present on admission  - COVID19 negative   - supplemental O2 during my evaluation 10l/min>>8L -Respiratory viral panel-negative  -serum fungitell- -legionella ab -nasal MRSA PCR-negative -Procalcitonin trend reviewed -strep pneumoniae ur AG -Histoplasma Ur Ag -sputum resp cultures -AFB sputum expectorated specimen -sputum cytology  -Agree with Cefepime empirically  -Reduced Solumedrol to 40 BID - 01/15/21>>50 -reviewed pertinent imaging with patient today - ESR and CRP are elevated  -PT/OT for d/c planning  -please encourage patient to use incentive spirometer few times each hour while hospitalized.   -continue Cellcept 250 bid to allow steroid sparing immunomodulation and weaning of steroids     Acute blood loss anemia    - noted Hb trending down from 14 to 10 -    -fecal occult blood test    - may be due to GI  bleed due to eliquis - monitor for grossly visible bleeding and stable vital sings    Thank you for allowing me to participate in the care of this patient.  Total face to face encounter time for this patient visit was >59mn. >50% of the time was  spent in counseling and coordination of care.   Patient/Family are satisfied with care plan and all questions have been answered.  This document was prepared using Dragon voice recognition software and may include unintentional dictation errors.     FOttie Glazier M.D.  [facility] [place]

## 2021-01-18 NOTE — Progress Notes (Signed)
PROGRESS NOTE    Edward Atkinson  [id number] DOB: 1961/01/27 DOA: 01/13/2021 PCP: Rusty Aus, MD    Brief Narrative:  60 year old male with a history of chronic respiratory failure 2 L of oxygen, interstitial lung disease related to covid pneumonia, prior history of lymphoma, admitted to the hospital with worsening shortness of breath and hypoxia.  Found to have possible pneumonia versus worsening ILD.  Started on antibiotics and steroids.  Pulmonology consulted.    Assessment & Plan:   Principal Problem:   Acute on chronic respiratory failure with hypoxia (HCC) Active Problems:   Type 2 diabetes mellitus without complication, without long-term current use of insulin (HCC)   SOB (shortness of breath)   Interstitial lung disease (HCC)   Severe sepsis (HCC)   CAP (community acquired pneumonia)   Elevated troponin   Protein calorie malnutrition (HCC)   Protein-calorie malnutrition, severe   Acute on chronic respiratory failure with hypoxia -Patient is chronically on 2 L of oxygen -Worsening shortness of breath and hypoxia over the past week, noted to have oxygen saturations in the 70s on 2 L -Initially placed on BiPAP, but has subsequently transitioned to high flow nasal cannula -Respiratory therapy following and he is currently on heated high flow nasal cannula -CTA chest negative for PE, does comment on possible progressive ILD versus pneumonia -He is currently on intravenous antibiotics with cefepime and azithromycin -Pulmonology following, appreciate assistance -inflammatory markers elevated -respiratory viral panel negative -cultures in process -Patient is also on intravenous steroids, bronchodilators and inhaled steroids -We will continue to wean down oxygen as tolerated -started on cellcept  Possible sepsis due to bilateral pneumonia -Patient presented with leukocytosis, tachycardia and tachypnea -Currently on IV antibiotics -Follow-up cultures  Rapid atrial  fibrillation -continues to have rapid heart rate -Amiodarone probably not the best choice for this patient with his known interstitial lung disease -Appreciate cardiology assistance -Currently on digoxin -Heart rate remains elevated, digoxin dose has been increased -Patient is already on chronic anticoagulation with apixaban  Anemia -No obvious blood loss -Continue to follow hemoglobin, currently stable -Stool FOBT has been ordered  Mild elevation of troponin -No significant chest pain at this time or EKG changes -Suspect this is demand ischemia in the setting of respiratory illness  Non-insulin-dependent diabetes, uncontrolled with hyperglycemia -Blood sugars elevated in setting of steroids -Oral agents currently on hold -Monitor on sliding scale insulin -Started on basal insulin  History of pulmonary embolism -He is chronically on apixaban. -CT angiogram of chest on admission is negative for pulmonary embolism    DVT prophylaxis: apixaban (ELIQUIS) tablet 2.5 mg Start: 01/13/21 2300 SCDs Start: 01/13/21 1831 apixaban (ELIQUIS) tablet 2.5 mg  Code Status: Full code Family Communication: Discussed with wife at the bedside Disposition Plan: Status is: Inpatient  Remains inpatient appropriate because:Ongoing diagnostic testing needed not appropriate for outpatient work up, IV treatments appropriate due to intensity of illness or inability to take PO, and Inpatient level of care appropriate due to severity of illness  Dispo: The patient is from: Home              Anticipated d/c is to: Home              Patient currently is not medically stable to d/c.   Difficult to place patient No         Consultants:  Pulmonology Cardiology  Procedures:    Antimicrobials:  Cefepime 9/14 > Azithromycin 9/14 >   Subjective: Reports improvement of sputum production  after receiving Mucomyst nebs.  Does not report worsening shortness of breath.  Objective: Vitals:    01/18/21 1335 01/18/21 1357 01/18/21 1525 01/18/21 1600  BP:  104/79 102/67   Pulse: (!) 121 (!) 111 78   Resp: (!) '25 20 20   '$ Temp:  98.9 F (37.2 C) 98.7 F (37.1 C) 98.2 F (36.8 C)  TempSrc:  Oral Oral Oral  SpO2: 96% 94% 98%   Weight:      Height:        Intake/Output Summary (Last 24 hours) at 01/18/2021 1959 Last data filed at 01/18/2021 1841 Gross per 24 hour  Intake 1851.74 ml  Output 1350 ml  Net 501.74 ml   Filed Weights   01/16/21 0500 01/17/21 0500 01/18/21 0400  Weight: 67.8 kg 70.2 kg 71.9 kg    Examination:  General exam: Alert, awake, oriented x 3 Respiratory system: Clear to auscultation. Respiratory effort normal. Cardiovascular system: Irregularly irregular no murmurs, rubs, gallops. Gastrointestinal system: Abdomen is nondistended, soft and nontender. No organomegaly or masses felt. Normal bowel sounds heard. Central nervous system: Alert and oriented. No focal neurological deficits. Extremities: No C/C/E, +pedal pulses Skin: No rashes, lesions or ulcers Psychiatry: Judgement and insight appear normal. Mood & affect appropriate.       Data Reviewed: I have personally reviewed following labs and imaging studies  CBC: Recent Labs  Lab 01/13/21 1333 01/14/21 0702 01/15/21 0440 01/16/21 0610 01/18/21 0424  WBC 12.3* 3.2* 7.0 10.3 12.4*  NEUTROABS 9.5* 2.6  --   --   --   HGB 13.5 11.2* 10.2* 10.3* 11.2*  HCT 41.6 34.7* 32.0* 32.8* 35.2*  MCV 84.9 82.8 82.5 84.1 85.0  PLT 440* 320 332 363 0000000   Basic Metabolic Panel: Recent Labs  Lab 01/13/21 1333 01/14/21 0702 01/15/21 0440 01/16/21 0610 01/18/21 0424  NA 131* 136 134* 137 137  K 4.3 4.2 3.9 4.2 4.0  CL 92* 98 99 101 97*  CO2 '23 23 28 28 '$ 33*  GLUCOSE 181* 244* 245* 253* 94  BUN '16 19 19 19 23  '$ CREATININE 0.79 0.60* 0.61 0.55* 0.58*  CALCIUM 9.3 9.1 8.7* 8.3* 8.9  MG 2.3 2.2  --  2.1  --   PHOS  --  4.3  --  3.3  --    GFR: Estimated Creatinine Clearance: 97.4 mL/min (A) (by  C-G formula based on SCr of 0.58 mg/dL (L)). Liver Function Tests: Recent Labs  Lab 01/13/21 1333 01/14/21 0702  AST 50* 30  ALT 37 33  ALKPHOS 109 89  BILITOT 0.7 1.1  PROT 7.2 6.6  ALBUMIN 3.1* 3.0*   No results for input(s): LIPASE, AMYLASE in the last 168 hours. No results for input(s): AMMONIA in the last 168 hours. Coagulation Profile: No results for input(s): INR, PROTIME in the last 168 hours. Cardiac Enzymes: No results for input(s): CKTOTAL, CKMB, CKMBINDEX, TROPONINI in the last 168 hours. BNP (last 3 results) No results for input(s): PROBNP in the last 8760 hours. HbA1C: No results for input(s): HGBA1C in the last 72 hours.  CBG: Recent Labs  Lab 01/17/21 1701 01/17/21 2100 01/18/21 0802 01/18/21 1224 01/18/21 1532  GLUCAP 219* 118* 124* 127* 343*   Lipid Profile: No results for input(s): CHOL, HDL, LDLCALC, TRIG, CHOLHDL, LDLDIRECT in the last 72 hours. Thyroid Function Tests: No results for input(s): TSH, T4TOTAL, FREET4, T3FREE, THYROIDAB in the last 72 hours. Anemia Panel: No results for input(s): VITAMINB12, FOLATE, FERRITIN, TIBC, IRON, RETICCTPCT in the last  72 hours. Sepsis Labs: Recent Labs  Lab 01/13/21 1909 01/13/21 1911 01/14/21 0702 01/15/21 0440  PROCALCITON  --  0.96 0.84 0.41  LATICACIDVEN 0.9  --  1.4  --     Recent Results (from the past 240 hour(s))  Resp Panel by RT-PCR (Flu A&B, Covid) Nasopharyngeal Swab     Status: None   Collection Time: 01/13/21  1:33 PM   Specimen: Nasopharyngeal Swab; Nasopharyngeal(NP) swabs in vial transport medium  Result Value Ref Range Status   SARS Coronavirus 2 by RT PCR NEGATIVE NEGATIVE Final    Comment: (NOTE) SARS-CoV-2 target nucleic acids are NOT DETECTED.  The SARS-CoV-2 RNA is generally detectable in upper respiratory specimens during the acute phase of infection. The lowest concentration of SARS-CoV-2 viral copies this assay can detect is 138 copies/mL. A negative result does not  preclude SARS-Cov-2 infection and should not be used as the sole basis for treatment or other patient management decisions. A negative result may occur with  improper specimen collection/handling, submission of specimen other than nasopharyngeal swab, presence of viral mutation(s) within the areas targeted by this assay, and inadequate number of viral copies(<138 copies/mL). A negative result must be combined with clinical observations, patient history, and epidemiological information. The expected result is Negative.  Fact Sheet for Patients:  EntrepreneurPulse.com.au  Fact Sheet for Healthcare Providers:  IncredibleEmployment.be  This test is no t yet approved or cleared by the [place] FDA and  has been authorized for detection and/or diagnosis of SARS-CoV-2 by FDA under an Emergency Use Authorization (EUA). This EUA will remain  in effect (meaning this test can be used) for the duration of the COVID-19 declaration under Section 564(b)(1) of the Act, 21 U.S.C.section 360bbb-3(b)(1), unless the authorization is terminated  or revoked sooner.       Influenza A by PCR NEGATIVE NEGATIVE Final   Influenza B by PCR NEGATIVE NEGATIVE Final    Comment: (NOTE) The Xpert Xpress SARS-CoV-2/FLU/RSV plus assay is intended as an aid in the diagnosis of influenza from Nasopharyngeal swab specimens and should not be used as a sole basis for treatment. Nasal washings and aspirates are unacceptable for Xpert Xpress SARS-CoV-2/FLU/RSV testing.  Fact Sheet for Patients: EntrepreneurPulse.com.au  Fact Sheet for Healthcare Providers: IncredibleEmployment.be  This test is not yet approved or cleared by the [place] FDA and has been authorized for detection and/or diagnosis of SARS-CoV-2 by FDA under an Emergency Use Authorization (EUA). This EUA will remain in effect (meaning this test can be used) for the  duration of the COVID-19 declaration under Section 564(b)(1) of the Act, 21 U.S.C. section 360bbb-3(b)(1), unless the authorization is terminated or revoked.  Performed at [facility], [address] [place], [place] [address]   Blood culture (routine x 2)     Status: None   Collection Time: 01/13/21  6:20 PM   Specimen: BLOOD  Result Value Ref Range Status   Specimen Description BLOOD RIGHT ANTECUBITAL  Final   Special Requests   Final    BOTTLES DRAWN AEROBIC AND ANAEROBIC Blood Culture adequate volume   Culture   Final    NO GROWTH 5 DAYS Performed at [facility], [address], [place], [place] [address]    Report Status 01/18/2021 FINAL  Final  Blood culture (routine x 2)     Status: None   Collection Time: 01/13/21  6:20 PM   Specimen: BLOOD  Result Value Ref Range Status   Specimen Description BLOOD LEFT ANTECUBITAL  Final   Special Requests   Final    BOTTLES DRAWN AEROBIC AND ANAEROBIC Blood Culture adequate volume   Culture   Final    NO GROWTH 5 DAYS Performed at [facility], [address] [place], [place] [address]    Report Status 01/18/2021 FINAL  Final  Respiratory (~20 pathogens) panel by PCR     Status: None   Collection Time: 01/15/21  2:01 PM   Specimen: Nasopharyngeal Swab; Respiratory  Result Value Ref Range Status   Adenovirus NOT DETECTED NOT DETECTED Final   Coronavirus 229E NOT DETECTED NOT DETECTED Final    Comment: (NOTE) The Coronavirus on the Respiratory Panel, DOES NOT test for the novel  Coronavirus (2019 nCoV)    Coronavirus HKU1 NOT DETECTED NOT DETECTED Final   Coronavirus NL63 NOT DETECTED NOT DETECTED Final   Coronavirus OC43 NOT DETECTED NOT DETECTED Final   Metapneumovirus NOT DETECTED NOT DETECTED Final   Rhinovirus / Enterovirus NOT DETECTED NOT DETECTED Final   Influenza A NOT DETECTED NOT DETECTED Final   Influenza B NOT DETECTED NOT DETECTED Final   Parainfluenza Virus 1 NOT DETECTED NOT  DETECTED Final   Parainfluenza Virus 2 NOT DETECTED NOT DETECTED Final   Parainfluenza Virus 3 NOT DETECTED NOT DETECTED Final   Parainfluenza Virus 4 NOT DETECTED NOT DETECTED Final   Respiratory Syncytial Virus NOT DETECTED NOT DETECTED Final   Bordetella pertussis NOT DETECTED NOT DETECTED Final   Bordetella Parapertussis NOT DETECTED NOT DETECTED Final   Chlamydophila pneumoniae NOT DETECTED NOT DETECTED Final   Mycoplasma pneumoniae NOT DETECTED NOT DETECTED Final    Comment: Performed at [facility], [place]. [address], [place], [place] [address]  MRSA Next Gen by PCR, Nasal     Status: None   Collection Time: 01/15/21  2:02 PM   Specimen: Nasal Mucosa; Nasal Swab  Result Value Ref Range Status   MRSA by PCR Next Gen NOT DETECTED NOT DETECTED Final    Comment: (NOTE) The GeneXpert MRSA Assay (FDA approved for NASAL specimens only), is one component of a comprehensive MRSA colonization surveillance program. It is not intended to diagnose MRSA infection nor to guide or monitor treatment for MRSA infections. Test performance is not FDA approved in patients less than 64 years old. Performed at [facility], [address], [place], [place] [address]          Radiology Studies: [facility] Chest [place] 1 View  Result Date: 01/18/2021 CLINICAL DATA:  Respiratory distress EXAM: PORTABLE CHEST 1 VIEW COMPARISON:  01/17/2021 FINDINGS: Cardiac shadow is stable. Right-sided chest wall port is again seen. Lungs are hypoinflated. Patchy airspace opacities again identified in both lungs left greater than right but stable from the prior exam. IMPRESSION: Patchy airspace opacities bilaterally stable from the prior exam. Electronically Signed   By: Inez Catalina M.D.   On: 01/18/2021 17:24   DG Chest Port 1 View  Result Date: 01/17/2021 CLINICAL DATA:  Worsening shortness of breath. EXAM: PORTABLE CHEST 1 VIEW COMPARISON:  01/14/2021 FINDINGS: 0826 hours. Low volume film. Patchy airspace  disease again noted in both lungs, left greater than right. Cardiopericardial silhouette is at upper limits of normal for size. Right Port-A-Cath again noted. Telemetry leads overlie the chest. IMPRESSION: Low volume film with patchy bilateral airspace disease, left greater than right. No substantial change. Electronically Signed   By: Misty Stanley M.D.   On: 01/17/2021 08:53        Scheduled Meds:  acyclovir  400 mg Oral BID   apixaban  2.5 mg Oral BID   Chlorhexidine Gluconate Cloth  6 each Topical Daily   digoxin  0.125 mg Oral Daily   diltiazem  180 mg Oral Daily   feeding supplement (NEPRO CARB STEADY)  237 mL Oral TID BM   guaiFENesin  1,200 mg Oral BID   insulin aspart  0-20 Units Subcutaneous TID WC   insulin aspart  0-5 Units Subcutaneous QHS   insulin aspart  5 Units Subcutaneous TID WC   insulin glargine-yfgn  20 Units Subcutaneous Daily   methylPREDNISolone (SOLU-MEDROL) injection  60 mg Intravenous Q12H   multivitamin with minerals  1 tablet Oral Daily   mycophenolate  250 mg Oral BID   sodium chloride flush  10-40 mL Intracatheter Q12H   Continuous Infusions:  azithromycin 500 mg (01/18/21 1831)   ceFEPime (MAXIPIME) IV 2 g (01/18/21 1408)     LOS: 5 days    Time spent: 74mns    JKathie Dike MD [facility]   If 7PM-7AM, please contact night-coverage [email or web address]  01/18/2021, 7:59 PM

## 2021-01-18 NOTE — Progress Notes (Signed)
[facility] Cardiology  SUBJECTIVE: Patient laying comfortably in bed, denies chest pain or palpitations, reports breathing has improved today   Vitals:   01/18/21 0000 01/18/21 0320 01/18/21 0400 01/18/21 0758  BP: 130/79  112/79 126/83  Pulse: 99  (!) 111 (!) 122  Resp: (!) 38  (!) 36 (!) 25  Temp: 98.2 F (36.8 C) 98.1 F (36.7 C) 98.1 F (36.7 C) 98.4 F (36.9 C)  TempSrc: Oral Oral Oral Oral  SpO2: 98%  95% 99%  Weight:   71.9 kg   Height:         Intake/Output Summary (Last 24 hours) at 01/18/2021 0810 Last data filed at 01/18/2021 0724 Gross per 24 hour  Intake 929.9 ml  Output 1450 ml  Net -520.1 ml      PHYSICAL EXAM  General: Well developed, well nourished, in no acute distress HEENT:  Normocephalic and atramatic Neck:  No JVD.  Lungs: Clear bilaterally to auscultation and percussion. Heart: HRRR . Normal S1 and S2 without gallops or murmurs.  Abdomen: Bowel sounds are positive, abdomen soft and non-tender  Msk:  Back normal, normal gait. Normal strength and tone for age. Extremities: No clubbing, cyanosis or edema.   Neuro: Alert and oriented X 3. Psych:  Good affect, responds appropriately   LABS: Basic Metabolic Panel: Recent Labs    01/16/21 0610 01/18/21 0424  NA 137 137  K 4.2 4.0  CL 101 97*  CO2 28 33*  GLUCOSE 253* 94  BUN 19 23  CREATININE 0.55* 0.58*  CALCIUM 8.3* 8.9  MG 2.1  --   PHOS 3.3  --    Liver Function Tests: No results for input(s): AST, ALT, ALKPHOS, BILITOT, PROT, ALBUMIN in the last 72 hours. No results for input(s): LIPASE, AMYLASE in the last 72 hours. CBC: Recent Labs    01/16/21 0610 01/18/21 0424  WBC 10.3 12.4*  HGB 10.3* 11.2*  HCT 32.8* 35.2*  MCV 84.1 85.0  PLT 363 333   Cardiac Enzymes: No results for input(s): CKTOTAL, CKMB, CKMBINDEX, TROPONINI in the last 72 hours. BNP: Invalid input(s): POCBNP D-Dimer: No results for input(s): DDIMER in the last 72 hours. Hemoglobin A1C: No results for input(s):  HGBA1C in the last 72 hours. Fasting Lipid Panel: No results for input(s): CHOL, HDL, LDLCALC, TRIG, CHOLHDL, LDLDIRECT in the last 72 hours. Thyroid Function Tests: No results for input(s): TSH, T4TOTAL, T3FREE, THYROIDAB in the last 72 hours.  Invalid input(s): FREET3 Anemia Panel: No results for input(s): VITAMINB12, FOLATE, FERRITIN, TIBC, IRON, RETICCTPCT in the last 72 hours.  DG Chest Port 1 View  Result Date: 01/17/2021 CLINICAL DATA:  Worsening shortness of breath. EXAM: PORTABLE CHEST 1 VIEW COMPARISON:  01/14/2021 FINDINGS: 0826 hours. Low volume film. Patchy airspace disease again noted in both lungs, left greater than right. Cardiopericardial silhouette is at upper limits of normal for size. Right Port-A-Cath again noted. Telemetry leads overlie the chest. IMPRESSION: Low volume film with patchy bilateral airspace disease, left greater than right. No substantial change. Electronically Signed   By: Misty Stanley M.D.   On: 01/17/2021 08:53     Echo LVEF 60 to 65% by 2D echocardiogram 01/30/2019  TELEMETRY: Atrial fibrillation at 140 bpm:  ASSESSMENT AND PLAN:  Principal Problem:   Acute on chronic respiratory failure with hypoxia (HCC) Active Problems:   Type 2 diabetes mellitus without complication, without long-term current use of insulin (HCC)   SOB (shortness of breath)   Interstitial lung disease ([place])  Severe sepsis ([place])   CAP (community acquired pneumonia)   Elevated troponin   Protein calorie malnutrition (HCC)   Protein-calorie malnutrition, severe    1. Atrial fibrillation with rapid ventricular rate, modest improvement after digoxin IV load, compensatory for respiratory failure, patient asymptomatic.  Heart rate 140 bpm on digoxin and low-dose Cardizem CD.  Patient on Eliquis with history of pulmonary embolus. 2.  Respiratory failure, and patient with COVID-19 interstitial lung disease, slowly improving 3.  History of pulmonary embolus, on Eliquis 4.   Borderline elevated high-sensitivity troponin (13, 22, 8), in the absence of chest pain or ischemic ECG changes, very likely due to demand supply ischemia in setting of atrial fibrillation with rapid ventricular rate and respiratory failure   Recommendations   1.  Agree with overall current therapy 2.  Continue Eliquis for stroke prevention 3.  Continue digoxin 0.125 mg p.o. daily for rate control 4.  Defer amiodarone in light of patient's history of interstitial lung disease 5.  Uptitrate Cardizem CD to 180 mg daily     Isaias Cowman, MD, PhD, [facility] 01/18/2021 8:10 AM

## 2021-01-18 NOTE — Progress Notes (Signed)
Inpatient Diabetes Program Recommendations  AACE/ADA: New Consensus Statement on Inpatient Glycemic Control   Target Ranges:  Prepandial:   less than 140 mg/dL      Peak postprandial:   less than 180 mg/dL (1-2 hours)      Critically ill patients:  140 - 180 mg/dL   Results for JORRELL, KUSTER" (MRN [id number]) as of 01/18/2021 11:32  Ref. Range 01/17/2021 07:39 01/17/2021 11:35 01/17/2021 15:42 01/17/2021 17:01 01/17/2021 21:00 01/18/2021 08:02  Glucose-Capillary Latest Ref Range: 70 - 99 mg/dL 261 (H) 432 (H) 242 (H) 219 (H) 118 (H) 124 (H)   Review of Glycemic Control  Diabetes history: DM2 Outpatient Diabetes medications: Amaryl 2 mg daily, Metformin 500 mg TID Current orders for Inpatient glycemic control: Semglee 20 units daily, Novolog 5 units TID with meals, Novolog 0-20 units TID with meals, Novolog 0-5 units QHS; Solumedrol 100 mg Q24H  Inpatient Diabetes Program Recommendations:    Insulin: If steroids are continued and post prandial glucose remains consistently over 180 mg/dl, please consider increasing meal coverage to Novolog 10 units TID with meals if patient eats at least 50% of meals.  Thanks, Barnie Alderman, RN, MSN, CDE Diabetes Coordinator Inpatient Diabetes Program [phone] (Team Pager from 8am to 5pm)

## 2021-01-18 NOTE — Progress Notes (Signed)
PT Cancellation Note  Patient Details Name: Edward Atkinson MRN: [id number] DOB: 13-Apr-1961   Cancelled Treatment:    Reason Eval/Treat Not Completed: Patient not medically ready. Spoke with nurse who reports pt continues to have elevated HR at rest, as well as O2 issues. Pt not appropriate for PT evaluation at this time. Will f/u as able & as pt is medically appropriate to participate.   [name], PT, DPT 01/18/21, 10:01 AM    [name] 01/18/2021, 10:00 AM

## 2021-01-19 ENCOUNTER — Inpatient Hospital Stay: Payer: BC Managed Care – PPO

## 2021-01-19 DIAGNOSIS — J9621 Acute and chronic respiratory failure with hypoxia: Secondary | ICD-10-CM | POA: Diagnosis not present

## 2021-01-19 DIAGNOSIS — J849 Interstitial pulmonary disease, unspecified: Secondary | ICD-10-CM | POA: Diagnosis not present

## 2021-01-19 DIAGNOSIS — R778 Other specified abnormalities of plasma proteins: Secondary | ICD-10-CM | POA: Diagnosis not present

## 2021-01-19 DIAGNOSIS — J189 Pneumonia, unspecified organism: Secondary | ICD-10-CM | POA: Diagnosis not present

## 2021-01-19 LAB — CBC
HCT: 34.6 % — ABNORMAL LOW (ref 39.0–52.0)
Hemoglobin: 11.2 g/dL — ABNORMAL LOW (ref 13.0–17.0)
MCH: 27.3 pg (ref 26.0–34.0)
MCHC: 32.4 g/dL (ref 30.0–36.0)
MCV: 84.4 fL (ref 80.0–100.0)
Platelets: 303 10*3/uL (ref 150–400)
RBC: 4.1 MIL/uL — ABNORMAL LOW (ref 4.22–5.81)
RDW: 16.9 % — ABNORMAL HIGH (ref 11.5–15.5)
WBC: 11.2 10*3/uL — ABNORMAL HIGH (ref 4.0–10.5)
nRBC: 0 % (ref 0.0–0.2)

## 2021-01-19 LAB — C-REACTIVE PROTEIN: CRP: 8.3 mg/dL — ABNORMAL HIGH (ref <1.0)

## 2021-01-19 LAB — BASIC METABOLIC PANEL WITH GFR
Anion gap: 5 (ref 5–15)
BUN: 24 mg/dL — ABNORMAL HIGH (ref 8–23)
CO2: 33 mmol/L — ABNORMAL HIGH (ref 22–32)
Calcium: 8.6 mg/dL — ABNORMAL LOW (ref 8.9–10.3)
Chloride: 98 mmol/L (ref 98–111)
Creatinine, Ser: 0.41 mg/dL — ABNORMAL LOW (ref 0.61–1.24)
GFR, Estimated: >60 mL/min
Glucose, Bld: 263 mg/dL — ABNORMAL HIGH (ref 70–99)
Potassium: 4.1 mmol/L (ref 3.5–5.1)
Sodium: 136 mmol/L (ref 135–145)

## 2021-01-19 LAB — GLUCOSE, CAPILLARY
Glucose-Capillary: 267 mg/dL — ABNORMAL HIGH (ref 70–99)
Glucose-Capillary: 414 mg/dL — ABNORMAL HIGH (ref 70–99)
Glucose-Capillary: 379 mg/dL — ABNORMAL HIGH (ref 70–99)
Glucose-Capillary: 307 mg/dL — ABNORMAL HIGH (ref 70–99)

## 2021-01-19 LAB — LEGIONELLA PNEUMOPHILA TOTAL AB: Legionella Pneumo Total Ab: <0.91 OD ratio (ref 0.00–0.90)

## 2021-01-19 LAB — LACTATE DEHYDROGENASE: LDH: 243 U/L — ABNORMAL HIGH (ref 98–192)

## 2021-01-19 MED ORDER — INSULIN ASPART 100 UNIT/ML IJ SOLN
10.0000 [IU] | Freq: Three times a day (TID) | INTRAMUSCULAR | Status: DC
  Administered 2021-01-20: 10 [IU] via SUBCUTANEOUS
  Filled 2021-01-19: qty 1

## 2021-01-19 MED ORDER — MYCOPHENOLATE MOFETIL 250 MG PO CAPS
500.0000 mg | ORAL_CAPSULE | Freq: Two times a day (BID) | ORAL | Status: DC
  Administered 2021-01-19 – 2021-02-04 (×32): 500 mg via ORAL
  Filled 2021-01-19 (×36): qty 2

## 2021-01-19 MED ORDER — SULFAMETHOXAZOLE-TRIMETHOPRIM 400-80 MG PO TABS
1.0000 | ORAL_TABLET | Freq: Every day | ORAL | Status: DC
  Administered 2021-01-19 – 2021-01-23 (×5): 1 via ORAL
  Filled 2021-01-19 (×5): qty 1

## 2021-01-19 MED ORDER — APIXABAN 5 MG PO TABS
5.0000 mg | ORAL_TABLET | Freq: Two times a day (BID) | ORAL | Status: DC
  Administered 2021-01-19 – 2021-02-19 (×62): 5 mg via ORAL
  Filled 2021-01-19 (×62): qty 1

## 2021-01-19 MED ORDER — METHYLPREDNISOLONE SODIUM SUCC 125 MG IJ SOLR
50.0000 mg | Freq: Two times a day (BID) | INTRAMUSCULAR | Status: DC
  Administered 2021-01-19 – 2021-01-25 (×12): 50 mg via INTRAVENOUS
  Filled 2021-01-19 (×13): qty 2

## 2021-01-19 NOTE — Progress Notes (Signed)
PROGRESS NOTE    Edward Atkinson  EHM:[id number] DOB: [birth year] DOA: 01/13/2021 PCP: Rusty Aus, MD    Brief Narrative:  60 year old male with a history of chronic respiratory failure 2 L of oxygen, interstitial lung disease related to covid pneumonia, prior history of lymphoma, admitted to the hospital with worsening shortness of breath and hypoxia.  Found to have possible pneumonia versus worsening ILD.  Started on antibiotics and steroids.  Pulmonology consulted.  Hospital course complicated by development of atrial fibrillation.  Heart rate is now better controlled and he is anticoagulated.  Currently, he is requiring heated high flow nasal cannula.  Weaning down oxygen as tolerated.    Assessment & Plan:   Principal Problem:   Acute on chronic respiratory failure with hypoxia (HCC) Active Problems:   Type 2 diabetes mellitus without complication, without long-term current use of insulin (HCC)   SOB (shortness of breath)   Interstitial lung disease (HCC)   Severe sepsis (HCC)   CAP (community acquired pneumonia)   Elevated troponin   Protein calorie malnutrition (HCC)   Protein-calorie malnutrition, severe   Acute on chronic respiratory failure with hypoxia -Patient is chronically on 2 L of oxygen -Worsening shortness of breath and hypoxia over the past week, noted to have oxygen saturations in the 70s on 2 L -Initially placed on BiPAP, but has subsequently transitioned to high flow nasal cannula -Patient appears to be increasingly short of breath and was transitioned to heated high flow nasal cannula -Respiratory therapy following and he is currently on heated high flow nasal cannula -CTA chest negative for PE, does comment on possible progressive ILD versus pneumonia -Pulmonology following, appreciate assistance -inflammatory markers elevated, but trending down -respiratory viral panel negative -cultures in process -Patient is also on intravenous steroids,  bronchodilators and inhaled steroids -Starting to wean steroids -We will continue to wean down oxygen as tolerated -CellCept increased to 500 mg twice daily -Started on Bactrim for PCP prophylaxis -He is already on acyclovir for shingles prophylaxis  Possible sepsis due to bilateral pneumonia -Patient presented with leukocytosis, tachycardia and tachypnea -He was receiving cefepime which has since been discontinued -Continued on azithromycin to cover atypicals -Follow-up cultures  Rapid atrial fibrillation -Amiodarone probably not the best choice for this patient with his known interstitial lung disease -Appreciate cardiology assistance -Currently on digoxin and Cardizem. -Overall heart rate has improved -Patient is already on chronic anticoagulation with apixaban  Anemia -No obvious blood loss -Continue to follow hemoglobin, currently stable -Stool FOBT has been ordered  Mild elevation of troponin -No significant chest pain at this time or EKG changes -Suspect this is demand ischemia in the setting of respiratory illness  Non-insulin-dependent diabetes, uncontrolled with hyperglycemia -Blood sugars elevated in setting of steroids -Oral agents currently on hold -Monitor on sliding scale insulin -Started on basal insulin as well as meal coverage insulin  History of pulmonary embolism -He is chronically on apixaban. -CT angiogram of chest on admission is negative for pulmonary embolism  History of follicular cell lymphoma -Continue to follow with oncology    DVT prophylaxis: SCDs Start: 01/13/21 1831 apixaban (ELIQUIS) tablet 5 mg  Code Status: Full code Family Communication: Discussed with wife at the bedside Disposition Plan: Status is: Inpatient  Remains inpatient appropriate because:Ongoing diagnostic testing needed not appropriate for outpatient work up, IV treatments appropriate due to intensity of illness or inability to take PO, and Inpatient level of care  appropriate due to severity of illness  Dispo: The patient is  from: Home              Anticipated d/c is to: Home              Patient currently is not medically stable to d/c.   Difficult to place patient No         Consultants:  Pulmonology Cardiology  Procedures:    Antimicrobials:  Cefepime 9/14 > 9/20 Azithromycin 9/14 >   Subjective: Feels that breathing is improving.  Does not have any significant cough at this point.  No chest pain.  Objective: Vitals:   01/19/21 1139 01/19/21 1647 01/19/21 1827 01/19/21 2032  BP: 104/76 131/67 119/71   Pulse: (!) 104 (!) 101 96   Resp: 18 18 18    Temp: 98.4 F (36.9 C) 98.2 F (36.8 C) 97.9 F (36.6 C)   TempSrc: Oral Oral Oral   SpO2: (!) 88% 94% 95% 98%  Weight:      Height:        Intake/Output Summary (Last 24 hours) at 01/19/2021 2034 Last data filed at 01/19/2021 1820 Gross per 24 hour  Intake 720 ml  Output 1800 ml  Net -1080 ml   Filed Weights   01/17/21 0500 01/18/21 0400 01/19/21 0600  Weight: 70.2 kg 71.9 kg 70.9 kg    Examination: General exam: Alert, awake, oriented x 3 Respiratory system: Clear to auscultation. Respiratory effort normal. Cardiovascular system: Irregular.  No murmurs, rubs, gallops. Gastrointestinal system: Abdomen is nondistended, soft and nontender. No organomegaly or masses felt. Normal bowel sounds heard. Central nervous system: Alert and oriented. No focal neurological deficits. Extremities: No C/C/E, +pedal pulses Skin: No rashes, lesions or ulcers Psychiatry: Judgement and insight appear normal. Mood & affect appropriate.      Data Reviewed: I have personally reviewed following labs and imaging studies  CBC: Recent Labs  Lab 01/13/21 1333 01/14/21 0702 01/15/21 0440 01/16/21 0610 01/18/21 0424 01/19/21 0443  WBC 12.3* 3.2* 7.0 10.3 12.4* 11.2*  NEUTROABS 9.5* 2.6  --   --   --   --   HGB 13.5 11.2* 10.2* 10.3* 11.2* 11.2*  HCT 41.6 34.7* 32.0* 32.8* 35.2*  34.6*  MCV 84.9 82.8 82.5 84.1 85.0 84.4  PLT 440* 320 332 363 333 201   Basic Metabolic Panel: Recent Labs  Lab 01/13/21 1333 01/14/21 0702 01/15/21 0440 01/16/21 0610 01/18/21 0424 01/19/21 0443  NA 131* 136 134* 137 137 136  K 4.3 4.2 3.9 4.2 4.0 4.1  CL 92* 98 99 101 97* 98  CO2 23 23 28 28  33* 33*  GLUCOSE 181* 244* 245* 253* 94 263*  BUN 16 19 19 19 23  24*  CREATININE 0.79 0.60* 0.61 0.55* 0.58* 0.41*  CALCIUM 9.3 9.1 8.7* 8.3* 8.9 8.6*  MG 2.3 2.2  --  2.1  --   --   PHOS  --  4.3  --  3.3  --   --    GFR: Estimated Creatinine Clearance: 96 mL/min (A) (by C-G formula based on SCr of 0.41 mg/dL (L)). Liver Function Tests: Recent Labs  Lab 01/13/21 1333 01/14/21 0702  AST 50* 30  ALT 37 33  ALKPHOS 109 89  BILITOT 0.7 1.1  PROT 7.2 6.6  ALBUMIN 3.1* 3.0*   No results for input(s): LIPASE, AMYLASE in the last 168 hours. No results for input(s): AMMONIA in the last 168 hours. Coagulation Profile: No results for input(s): INR, PROTIME in the last 168 hours. Cardiac Enzymes: No results for input(s): CKTOTAL,  CKMB, CKMBINDEX, TROPONINI in the last 168 hours. BNP (last 3 results) No results for input(s): PROBNP in the last 8760 hours. HbA1C: No results for input(s): HGBA1C in the last 72 hours.  CBG: Recent Labs  Lab 01/18/21 1532 01/18/21 2137 01/19/21 0733 01/19/21 1140 01/19/21 1649  GLUCAP 343* 269* 267* 414* 379*   Lipid Profile: No results for input(s): CHOL, HDL, LDLCALC, TRIG, CHOLHDL, LDLDIRECT in the last 72 hours. Thyroid Function Tests: No results for input(s): TSH, T4TOTAL, FREET4, T3FREE, THYROIDAB in the last 72 hours. Anemia Panel: No results for input(s): VITAMINB12, FOLATE, FERRITIN, TIBC, IRON, RETICCTPCT in the last 72 hours. Sepsis Labs: Recent Labs  Lab 01/13/21 1909 01/13/21 1911 01/14/21 0702 01/15/21 0440  PROCALCITON  --  0.96 0.84 0.41  LATICACIDVEN 0.9  --  1.4  --     Recent Results (from the past 240 hour(s))   Resp Panel by RT-PCR (Flu A&B, Covid) Nasopharyngeal Swab     Status: None   Collection Time: 01/13/21  1:33 PM   Specimen: Nasopharyngeal Swab; Nasopharyngeal(NP) swabs in vial transport medium  Result Value Ref Range Status   SARS Coronavirus 2 by RT PCR NEGATIVE NEGATIVE Final    Comment: (NOTE) SARS-CoV-2 target nucleic acids are NOT DETECTED.  The SARS-CoV-2 RNA is generally detectable in upper respiratory specimens during the acute phase of infection. The lowest concentration of SARS-CoV-2 viral copies this assay can detect is 138 copies/mL. A negative result does not preclude SARS-Cov-2 infection and should not be used as the sole basis for treatment or other patient management decisions. A negative result may occur with  improper specimen collection/handling, submission of specimen other than nasopharyngeal swab, presence of viral mutation(s) within the areas targeted by this assay, and inadequate number of viral copies(<138 copies/mL). A negative result must be combined with clinical observations, patient history, and epidemiological information. The expected result is Negative.  Fact Sheet for Patients:  EntrepreneurPulse.com.au  Fact Sheet for Healthcare Providers:  IncredibleEmployment.be  This test is no t yet approved or cleared by the [place] FDA and  has been authorized for detection and/or diagnosis of SARS-CoV-2 by FDA under an Emergency Use Authorization (EUA). This EUA will remain  in effect (meaning this test can be used) for the duration of the COVID-19 declaration under Section 564(b)(1) of the Act, 21 U.S.C.section 360bbb-3(b)(1), unless the authorization is terminated  or revoked sooner.       Influenza A by PCR NEGATIVE NEGATIVE Final   Influenza B by PCR NEGATIVE NEGATIVE Final    Comment: (NOTE) The Xpert Xpress SARS-CoV-2/FLU/RSV plus assay is intended as an aid in the diagnosis of influenza from  Nasopharyngeal swab specimens and should not be used as a sole basis for treatment. Nasal washings and aspirates are unacceptable for Xpert Xpress SARS-CoV-2/FLU/RSV testing.  Fact Sheet for Patients: EntrepreneurPulse.com.au  Fact Sheet for Healthcare Providers: IncredibleEmployment.be  This test is not yet approved or cleared by the [place] FDA and has been authorized for detection and/or diagnosis of SARS-CoV-2 by FDA under an Emergency Use Authorization (EUA). This EUA will remain in effect (meaning this test can be used) for the duration of the COVID-19 declaration under Section 564(b)(1) of the Act, 21 U.S.C. section 360bbb-3(b)(1), unless the authorization is terminated or revoked.  Performed at [facility], [address] [place], [place] [address]   Blood culture (routine x 2)     Status: None   Collection Time: 01/13/21  6:20 PM   Specimen:  BLOOD  Result Value Ref Range Status   Specimen Description BLOOD RIGHT ANTECUBITAL  Final   Special Requests   Final    BOTTLES DRAWN AEROBIC AND ANAEROBIC Blood Culture adequate volume   Culture   Final    NO GROWTH 5 DAYS Performed at [facility], [address] [place], [place] [address]    Report Status 01/18/2021 FINAL  Final  Blood culture (routine x 2)     Status: None   Collection Time: 01/13/21  6:20 PM   Specimen: BLOOD  Result Value Ref Range Status   Specimen Description BLOOD LEFT ANTECUBITAL  Final   Special Requests   Final    BOTTLES DRAWN AEROBIC AND ANAEROBIC Blood Culture adequate volume   Culture   Final    NO GROWTH 5 DAYS Performed at [facility], [address] [place], [place] [address]    Report Status 01/18/2021 FINAL  Final  Respiratory (~20 pathogens) panel by PCR     Status: None   Collection Time: 01/15/21  2:01 PM   Specimen: Nasopharyngeal Swab; Respiratory  Result Value Ref Range Status   Adenovirus NOT DETECTED  NOT DETECTED Final   Coronavirus 229E NOT DETECTED NOT DETECTED Final    Comment: (NOTE) The Coronavirus on the Respiratory Panel, DOES NOT test for the novel  Coronavirus (2019 nCoV)    Coronavirus HKU1 NOT DETECTED NOT DETECTED Final   Coronavirus NL63 NOT DETECTED NOT DETECTED Final   Coronavirus OC43 NOT DETECTED NOT DETECTED Final   Metapneumovirus NOT DETECTED NOT DETECTED Final   Rhinovirus / Enterovirus NOT DETECTED NOT DETECTED Final   Influenza A NOT DETECTED NOT DETECTED Final   Influenza B NOT DETECTED NOT DETECTED Final   Parainfluenza Virus 1 NOT DETECTED NOT DETECTED Final   Parainfluenza Virus 2 NOT DETECTED NOT DETECTED Final   Parainfluenza Virus 3 NOT DETECTED NOT DETECTED Final   Parainfluenza Virus 4 NOT DETECTED NOT DETECTED Final   Respiratory Syncytial Virus NOT DETECTED NOT DETECTED Final   Bordetella pertussis NOT DETECTED NOT DETECTED Final   Bordetella Parapertussis NOT DETECTED NOT DETECTED Final   Chlamydophila pneumoniae NOT DETECTED NOT DETECTED Final   Mycoplasma pneumoniae NOT DETECTED NOT DETECTED Final    Comment: Performed at [facility], [place] [address], [place], [place] [address]  MRSA Next Gen by PCR, Nasal     Status: None   Collection Time: 01/15/21  2:02 PM   Specimen: Nasal Mucosa; Nasal Swab  Result Value Ref Range Status   MRSA by PCR Next Gen NOT DETECTED NOT DETECTED Final    Comment: (NOTE) The GeneXpert MRSA Assay (FDA approved for NASAL specimens only), is one component of a comprehensive MRSA colonization surveillance program. It is not intended to diagnose MRSA infection nor to guide or monitor treatment for MRSA infections. Test performance is not FDA approved in patients less than 74 years old. Performed at [facility], [address], [place], [place] [address]          Radiology Studies: DG Chest [address] [place] 1 View  Result Date: 01/19/2021 CLINICAL DATA:  Abnormality on previous chest x-ray in a  60 year old male. EXAM: PORTABLE CHEST 1 VIEW COMPARISON:  CT from January 13, 2021, chest x-ray January 18, 2021. FINDINGS: RIGHT-sided Port-A-Cath terminates at the caval to atrial junction. Trachea is midline. Cardiomediastinal contours are stable. Interstitial and airspace opacities throughout the chest worse in the LEFT mid to upper chest, RIGHT upper chest lung bases  bilaterally with similar appearance. On limited assessment there is no acute skeletal process. EKG leads project over the chest. IMPRESSION: No interval change in the appearance of the chest since previous imaging with continued decreased lung volumes and patchy bilateral opacities. Electronically Signed   By: Zetta Bills M.D.   On: 01/19/2021 07:59   DG Chest Port 1 View  Result Date: 01/18/2021 CLINICAL DATA:  Respiratory distress EXAM: PORTABLE CHEST 1 VIEW COMPARISON:  01/17/2021 FINDINGS: Cardiac shadow is stable. Right-sided chest wall port is again seen. Lungs are hypoinflated. Patchy airspace opacities again identified in both lungs left greater than right but stable from the prior exam. IMPRESSION: Patchy airspace opacities bilaterally stable from the prior exam. Electronically Signed   By: Inez Catalina M.D.   On: 01/18/2021 17:24        Scheduled Meds:  acyclovir  400 mg Oral BID   apixaban  5 mg Oral BID   Chlorhexidine Gluconate Cloth  6 each Topical Daily   digoxin  0.125 mg Oral Daily   diltiazem  180 mg Oral Daily   feeding supplement (NEPRO CARB STEADY)  237 mL Oral TID BM   guaiFENesin  1,200 mg Oral BID   insulin aspart  0-20 Units Subcutaneous TID WC   insulin aspart  0-5 Units Subcutaneous QHS   insulin aspart  5 Units Subcutaneous TID WC   insulin glargine-yfgn  20 Units Subcutaneous Daily   methylPREDNISolone (SOLU-MEDROL) injection  50 mg Intravenous Q12H   multivitamin with minerals  1 tablet Oral Daily   mycophenolate  500 mg Oral BID   sodium chloride flush  10-40 mL Intracatheter Q12H    sulfamethoxazole-trimethoprim  1 tablet Oral Daily   Continuous Infusions:  azithromycin 500 mg (01/19/21 1706)   ceFEPime (MAXIPIME) IV 2 g (01/19/21 1457)     LOS: 6 days    Time spent: 26mins    Kathie Dike, MD [facility]   If 7PM-7AM, please contact night-coverage [email or web address]  01/19/2021, 8:34 PM

## 2021-01-19 NOTE — Progress Notes (Signed)
[facility] Cardiology  SUBJECTIVE: Patient laying in bed, denies chest pain or palpitation   Vitals:   01/19/21 0000 01/19/21 0400 01/19/21 0600 01/19/21 0730  BP: 114/82 112/83  116/72  Pulse: 87 79  100  Resp: (!) 22 (!) 22  18  Temp:    98.1 F (36.7 C)  TempSrc:    Oral  SpO2: 93% 97%  98%  Weight:   70.9 kg   Height:         Intake/Output Summary (Last 24 hours) at 01/19/2021 0755 Last data filed at 01/19/2021 0551 Gross per 24 hour  Intake 1440 ml  Output 1100 ml  Net 340 ml      PHYSICAL EXAM  General: Well developed, well nourished, in no acute distress HEENT:  Normocephalic and atramatic Neck:  No JVD.  Lungs: Clear bilaterally to auscultation and percussion. Heart: HRRR . Normal S1 and S2 without gallops or murmurs.  Abdomen: Bowel sounds are positive, abdomen soft and non-tender  Msk:  Back normal, normal gait. Normal strength and tone for age. Extremities: No clubbing, cyanosis or edema.   Neuro: Alert and oriented X 3. Psych:  Good affect, responds appropriately   LABS: Basic Metabolic Panel: Recent Labs    01/18/21 0424 01/19/21 0443  NA 137 136  K 4.0 4.1  CL 97* 98  CO2 33* 33*  GLUCOSE 94 263*  BUN 23 24*  CREATININE 0.58* 0.41*  CALCIUM 8.9 8.6*   Liver Function Tests: No results for input(s): AST, ALT, ALKPHOS, BILITOT, PROT, ALBUMIN in the last 72 hours. No results for input(s): LIPASE, AMYLASE in the last 72 hours. CBC: Recent Labs    01/18/21 0424 01/19/21 0443  WBC 12.4* 11.2*  HGB 11.2* 11.2*  HCT 35.2* 34.6*  MCV 85.0 84.4  PLT 333 303   Cardiac Enzymes: No results for input(s): CKTOTAL, CKMB, CKMBINDEX, TROPONINI in the last 72 hours. BNP: Invalid input(s): POCBNP D-Dimer: No results for input(s): DDIMER in the last 72 hours. Hemoglobin A1C: No results for input(s): HGBA1C in the last 72 hours. Fasting Lipid Panel: No results for input(s): CHOL, HDL, LDLCALC, TRIG, CHOLHDL, LDLDIRECT in the last 72 hours. Thyroid  Function Tests: No results for input(s): TSH, T4TOTAL, T3FREE, THYROIDAB in the last 72 hours.  Invalid input(s): FREET3 Anemia Panel: No results for input(s): VITAMINB12, FOLATE, FERRITIN, TIBC, IRON, RETICCTPCT in the last 72 hours.  DG Chest Port 1 View  Result Date: 01/18/2021 CLINICAL DATA:  Respiratory distress EXAM: PORTABLE CHEST 1 VIEW COMPARISON:  01/17/2021 FINDINGS: Cardiac shadow is stable. Right-sided chest wall port is again seen. Lungs are hypoinflated. Patchy airspace opacities again identified in both lungs left greater than right but stable from the prior exam. IMPRESSION: Patchy airspace opacities bilaterally stable from the prior exam. Electronically Signed   By: Inez Catalina M.D.   On: 01/18/2021 17:24   DG Chest Port 1 View  Result Date: 01/17/2021 CLINICAL DATA:  Worsening shortness of breath. EXAM: PORTABLE CHEST 1 VIEW COMPARISON:  01/14/2021 FINDINGS: 0826 hours. Low volume film. Patchy airspace disease again noted in both lungs, left greater than right. Cardiopericardial silhouette is at upper limits of normal for size. Right Port-A-Cath again noted. Telemetry leads overlie the chest. IMPRESSION: Low volume film with patchy bilateral airspace disease, left greater than right. No substantial change. Electronically Signed   By: Misty Stanley M.D.   On: 01/17/2021 08:53     Echo LVEF 60 to 65% by 2D echocardiogram 01/30/2019  TELEMETRY: Atrial fibrillation  117 bpm:  ASSESSMENT AND PLAN:  Principal Problem:   Acute on chronic respiratory failure with hypoxia (HCC) Active Problems:   Type 2 diabetes mellitus without complication, without long-term current use of insulin (HCC)   SOB (shortness of breath)   Interstitial lung disease (HCC)   Severe sepsis (HCC)   CAP (community acquired pneumonia)   Elevated troponin   Protein calorie malnutrition (HCC)   Protein-calorie malnutrition, severe    1. Atrial fibrillation with rapid ventricular rate, modest  improvement after digoxin IV load, compensatory for respiratory failure, patient asymptomatic.  Heart rate 140 bpm on digoxin and low-dose Cardizem CD.  Patient on Eliquis with history of pulmonary embolus. 2.  Respiratory failure, and patient with COVID-19 interstitial lung disease, slowly improving 3.  History of pulmonary embolus, on Eliquis 4.  Borderline elevated high-sensitivity troponin (13, 22, 8), in the absence of chest pain or ischemic ECG changes, very likely due to demand supply ischemia in setting of atrial fibrillation with rapid ventricular rate and respiratory failure   Recommendations   1.  Agree with overall current therapy 2.  Continue Eliquis for stroke prevention 3.  Continue digoxin 0.125 mg p.o. daily for rate control 4.  Defer amiodarone in light of patient's history of interstitial lung disease 5.  Continue Cardizem CD to 180 mg daily 6.  Follow-up with me 1 to 2 weeks after discharge   Isaias Cowman, MD, PhD, [address] [facility] 01/19/2021 7:55 AM

## 2021-01-19 NOTE — Progress Notes (Signed)
Nutrition Follow Up Note   DOCUMENTATION CODES:   Severe malnutrition in context of chronic illness  INTERVENTION:   Nepro Shake po TID, each supplement provides 425 kcal and 19 grams protein  MVI po daily   NUTRITION DIAGNOSIS:   Severe Malnutrition related to chronic illness (lymphoma, ILD) as evidenced by severe fat depletion, severe muscle depletion, 16 percent weight loss in <3 months.  GOAL:   Patient will meet greater than or equal to 90% of their needs -progressing   MONITOR:   PO intake, Supplement acceptance, Labs, Weight trends, Skin, I & O's  ASSESSMENT:   60 y/o male with h/o ILD, COVID19, T2DM, PE on eliquis and lymphoma s/p Rituxan which is completed who is admitted with PNA and sepsis.  Pt with good appetite and oral intake in hospital; pt eating 90-100% of meals and is drinking his Nepro supplements. Per chart, pt up ~23lbs since admission but appears to be back at his UBW.   Medications reviewed and include: insulin, solu-medrol, MVI, cellcept, azithromycin, cefepime   Labs reviewed: BUN 24(H), creat 0.41(L), K 4.1 wnl P 3.3 wnl, Mg 2.1 wnl- 9/17 Wbc- 11.2(H) Cbgs- 267, 414 x 24 hrs  Diet Order:   Diet Order             Diet Carb Modified Fluid consistency: Thin; Room service appropriate? Yes  Diet effective now                  EDUCATION NEEDS:   Education needs have been addressed  Skin:  Skin Assessment: Reviewed RN Assessment  Last BM:  9/20- type 6  Height:   Ht Readings from Last 1 Encounters:  01/14/21 6' (1.829 m)    Weight:   Wt Readings from Last 1 Encounters:  01/19/21 70.9 kg    Ideal Body Weight:  80.9 kg  BMI:  Body mass index is 21.2 kg/m.  Estimated Nutritional Needs:   Kcal:  2200-2500kcal/day  Protein:  110-125g/day  Fluid:  2.0-2.3L/day  Koleen Distance MS, RD, LDN Please refer to [facility] for RD and/or RD on-call/weekend/after hours pager

## 2021-01-19 NOTE — Progress Notes (Signed)
Pulmonary Medicine          Date: 01/19/2021,   MRN# [id number] Edward Atkinson 1960-09-13     AdmissionWeight: 63.5 kg                 CurrentWeight: 70.9 kg   Referring physician: Dr Roderic Palau   CHIEF COMPLAINT:   Acute on chronic hypoxemic respiratory failure   HISTORY OF PRESENT ILLNESS   60 yo M w/hx of chronic hypoxemia and ILD post COVID19, T2DM, PE on eliquis, Lymphoma s/p Rituxan which is completed.  Came in due to worsening SOB and DOE specifically mild exertional defacation/urination with feelings of presyncope and severe dyspnea which started to get worse post tapering from steroid appx few days prior to onset of symptom progression.  He was placed on solumedrol and reported mild improvement.  He is on 10L /min [place] during my evaluation.  He had CT chest done with PE protocol and noted to have bilateral GGO infiltrates worse compared to June study. PCCM consultation for further evaluation and management.     01/17/21-  patient seems to be slighly clinically worse post reduction of IV steroids yesterday indicative of ongoing ILD exacerbation, thus far infectious workup is negative but inflammatory biomarkers are highly elevated also pointing towards ILD exacerbation.  There is new CXR in process this morning for interval changes. We discussed increasing steroids slightly and giving more time.  I will also initiate steroid sparing immunomodulation with BID cellcept as patient will likely need therapy for prolonged time period.  Compensatory tachycardia noted appreciate cardiology team.  Sugars with peaks and valleys with adjusted steroid therapy.  Patient with muscle wasting and protein calorie malnutrition but eating better this am.  01/18/21- patient is on HFNC at 60%, feels better, has not been able to tolerate BIPAP.  Wife is at bedside.  BP is borderline low MAP 76.  He has negative infectious workup and thus far ILD exacerbation seems to be main differential. He  continues to have peaks and valleys in blood glucose.  We started steroid sparing regimen with cellcept bid 250 mg and will increase dose as we taper off prednisone.  Remains tachyarrtythmic but improved. Appreciate everyone involved.     01/19/21- patient seen and examined at bedside.  Wife present during evaluation.  Ow HFNC weaned to 41%/45L/MIN.  CRP is still up, have increased cellcept and reducing solumedrol.  Patient felt stronger physically and was able to get OOB to chair. Cough is less now.  He is non-labored with breathing no accessory mm use today. He had dark stools with reduced hb, FOBT ordred.  Haptoglobin is elevated. Pathology slide review ordered.    PAST MEDICAL HISTORY   Past Medical History:  Diagnosis Date  . Chronic respiratory failure with hypoxia (HCC)    2L [place] continuous  . COVID-19   . Diabetes mellitus without complication ([place])   . Diverticulitis   . Follicular lymphoma of intra-abdominal lymph nodes ([place]) 02/15/2019  . ILD (interstitial lung disease) ([place])      SURGICAL HISTORY   Past Surgical History:  Procedure Laterality Date  . CHOLECYSTECTOMY    . COLON RESECTION     DUE TO DIVERTICULITIS  . FLEXIBLE BRONCHOSCOPY N/A 12/02/2019   Procedure: FLEXIBLE BRONCHOSCOPY;  Surgeon: Ottie Glazier, MD;  Location: [facility] ORS;  Service: Thoracic;  Laterality: N/A;  . PORTA CATH INSERTION N/A 03/11/2019   Procedure: PORTA CATH INSERTION;  Surgeon: Algernon Huxley, MD;  Location: [facility]  INVASIVE CV LAB;  Service: Cardiovascular;  Laterality: N/A;  . PULMONARY THROMBECTOMY N/A 01/29/2019   Procedure: PULMONARY THROMBECTOMY;  Surgeon: Algernon Huxley, MD;  Location: [place] CV LAB;  Service: Cardiovascular;  Laterality: N/A;     FAMILY HISTORY   Family History  Problem Relation Age of Onset  . Diabetes Brother      SOCIAL HISTORY   Social History   Tobacco Use  . Smoking status: Never  . Smokeless tobacco: Never  Vaping Use  . Vaping Use: Never used   Substance Use Topics  . Alcohol use: Yes    Alcohol/week: 0.0 - 2.0 standard drinks    Comment: occasional  . Drug use: Never     MEDICATIONS    Home Medication:    Current Medication:  Current Facility-Administered Medications:  .  acetaminophen (TYLENOL) tablet 650 mg, 650 mg, Oral, Q6H PRN, 650 mg at 01/18/21 1411 **OR** acetaminophen (TYLENOL) suppository 650 mg, 650 mg, Rectal, Q6H PRN, Howerter, Justin B, DO .  acyclovir (ZOVIRAX) 200 MG capsule 400 mg, 400 mg, Oral, BID, Kathie Dike, MD, 400 mg at 01/19/21 0807 .  apixaban (ELIQUIS) tablet 5 mg, 5 mg, Oral, BID, Beers, Shanon Brow, RPH .  azithromycin (ZITHROMAX) 500 mg in sodium chloride 0.9 % 250 mL IVPB, 500 mg, Intravenous, Q24H, Howerter, Justin B, DO, Last Rate: 250 mL/hr at 01/19/21 1706, 500 mg at 01/19/21 1706 .  ceFEPIme (MAXIPIME) 2 g in sodium chloride 0.9 % 100 mL IVPB, 2 g, Intravenous, Q8H, Dolan, Carissa E, RPH, Last Rate: 200 mL/hr at 01/19/21 1457, 2 g at 01/19/21 1457 .  Chlorhexidine Gluconate Cloth 2 % PADS 6 each, 6 each, Topical, Daily, Kathie Dike, MD, 6 each at 01/19/21 0810 .  digoxin (LANOXIN) tablet 0.125 mg, 0.125 mg, Oral, Daily, Memon, Jolaine Artist, MD, 0.125 mg at 01/19/21 0807 .  diltiazem (CARDIZEM CD) 24 hr capsule 180 mg, 180 mg, Oral, Daily, Paraschos, Alexander, MD, 180 mg at 01/19/21 0807 .  feeding supplement (NEPRO CARB STEADY) liquid 237 mL, 237 mL, Oral, TID BM, Memon, Jehanzeb, MD, 237 mL at 01/19/21 1457 .  guaiFENesin (MUCINEX) 12 hr tablet 1,200 mg, 1,200 mg, Oral, BID, Kathie Dike, MD, 1,200 mg at 01/19/21 0807 .  guaiFENesin-dextromethorphan (ROBITUSSIN DM) 100-10 MG/5ML syrup 5 mL, 5 mL, Oral, Q4H PRN, Kathie Dike, MD, 5 mL at 01/17/21 0648 .  insulin aspart (novoLOG) injection 0-20 Units, 0-20 Units, Subcutaneous, TID WC, Kathie Dike, MD, 20 Units at 01/19/21 1707 .  insulin aspart (novoLOG) injection 0-5 Units, 0-5 Units, Subcutaneous, QHS, Kathie Dike, MD, 3  Units at 01/18/21 2143 .  insulin aspart (novoLOG) injection 5 Units, 5 Units, Subcutaneous, TID WC, Kathie Dike, MD, 5 Units at 01/19/21 1707 .  insulin glargine-yfgn (SEMGLEE) injection 20 Units, 20 Units, Subcutaneous, Daily, Memon, Jehanzeb, MD, 20 Units at 01/19/21 0808 .  levalbuterol [facility]) nebulizer solution 1.25 mg, 1.25 mg, Nebulization, Q4H PRN, Howerter, Justin B, DO, 1.25 mg at 01/17/21 2011 .  methylPREDNISolone sodium succinate (SOLU-MEDROL) 125 mg/2 mL injection 50 mg, 50 mg, Intravenous, Q12H, Nicloe Frontera, MD .  multivitamin with minerals tablet 1 tablet, 1 tablet, Oral, Daily, Kathie Dike, MD, 1 tablet at 01/19/21 0806 .  mycophenolate (CELLCEPT) capsule 500 mg, 500 mg, Oral, BID, Akeira Lahm, MD .  sodium chloride flush (NS) 0.9 % injection 10-40 mL, 10-40 mL, Intracatheter, Q12H, Kathie Dike, MD, 10 mL at 01/19/21 0811 .  sodium chloride flush (NS) 0.9 % injection 10-40 mL, 10-40  mL, Intracatheter, PRN, Kathie Dike, MD  Facility-Administered Medications Ordered in Other Encounters:  .  heparin lock flush 100 unit/mL, 500 Units, Intravenous, Once, Earlie Server, MD    ALLERGIES   Penicillins     REVIEW OF SYSTEMS    Review of Systems:  Gen:  Denies  fever, sweats, chills weigh loss  HEENT: Denies blurred vision, double vision, ear pain, eye pain, hearing loss, nose bleeds, sore throat Cardiac:  No dizziness, chest pain or heaviness, chest tightness,edema Resp:   Denies cough or sputum porduction, shortness of breath,wheezing, hemoptysis,  Gi: Denies swallowing difficulty, stomach pain, nausea or vomiting, diarrhea, constipation, bowel incontinence Gu:  Denies bladder incontinence, burning urine Ext:   Denies Joint pain, stiffness or swelling Skin: Denies  skin rash, easy bruising or bleeding or hives Endoc:  Denies polyuria, polydipsia , polyphagia or weight change Psych:   Denies depression, insomnia or hallucinations   Other:  All other  systems negative   VS: BP 131/67 (BP Location: Right Arm)   Pulse (!) 101   Temp 98.2 F (36.8 C) (Oral)   Resp 18   Ht 6' (1.829 m)   Wt 70.9 kg   SpO2 94%   BMI 21.20 kg/m      PHYSICAL EXAM    GENERAL:NAD, no fevers, chills, no weakness no fatigue HEAD: Normocephalic, atraumatic.  EYES: Pupils equal, round, reactive to light. Extraocular muscles intact. No scleral icterus.  MOUTH: Moist mucosal membrane. Dentition intact. No abscess noted.  EAR, NOSE, THROAT: Clear without exudates. No external lesions.  NECK: Supple. No thyromegaly. No nodules. No JVD.  PULMONARY: Bilateral rhonchi CARDIOVASCULAR: S1 and S2. Regular rate and rhythm. No murmurs, rubs, or gallops. No edema. Pedal pulses 2+ bilaterally.  GASTROINTESTINAL: Soft, nontender, nondistended. No masses. Positive bowel sounds. No hepatosplenomegaly.  MUSCULOSKELETAL: No swelling, clubbing, or edema. Range of motion full in all extremities.  NEUROLOGIC: Cranial nerves II through XII are intact. No gross focal neurological deficits. Sensation intact. Reflexes intact.  SKIN: No ulceration, lesions, rashes, or cyanosis. Skin warm and dry. Turgor intact.  PSYCHIATRIC: Mood, affect within normal limits. The patient is awake, alert and oriented x 3. Insight, judgment intact.       IMAGING    CT Angio Chest PE W/Cm &/Or Wo Cm  Result Date: 01/13/2021 CLINICAL DATA:  Respiratory distress, hypoxia EXAM: CT ANGIOGRAPHY CHEST WITH CONTRAST TECHNIQUE: Multidetector CT imaging of the chest was performed using the standard protocol during bolus administration of intravenous contrast. Multiplanar CT image reconstructions and MIPs were obtained to evaluate the vascular anatomy. CONTRAST:  95m OMNIPAQUE IOHEXOL 350 MG/ML SOLN COMPARISON:  10/26/2020, 01/13/2021 FINDINGS: Cardiovascular: This is a technically adequate evaluation of the pulmonary vasculature. No filling defects or pulmonary emboli. The heart is unremarkable without  pericardial effusion. No evidence of thoracic aortic aneurysm or dissection. Right chest wall port via internal jugular approach tip within the superior vena cava. Mediastinum/Nodes: No pathologic adenopathy within the mediastinum, hila, or axilla. Thyroid, trachea, and esophagus are unremarkable. Lungs/Pleura: There has been interval progression of the multifocal ground-glass airspace disease seen previously. Changes are most pronounced within the dependent lower lobes. No effusion or pneumothorax. The central airways are patent, with persistent bronchiectasis. Upper Abdomen: No acute abnormality. Musculoskeletal: No acute or destructive bony lesions. Reconstructed images demonstrate no additional findings. Review of the MIP images confirms the above findings. IMPRESSION: 1. No evidence of pulmonary embolus. 2. Progressive multifocal bilateral ground-glass airspace disease, greatest at the lung bases. The differential  diagnosis would include progressive postinflammatory scarring and fibrosis, multifocal atypical pneumonia, or hypersensitivity/drug toxicity. 3. Stable bronchiectasis. Electronically Signed   By: Randa Ngo M.D.   On: 01/13/2021 17:13   DG Chest Port 1 View  Result Date: 01/19/2021 CLINICAL DATA:  Abnormality on previous chest x-ray in a 60 year old male. EXAM: PORTABLE CHEST 1 VIEW COMPARISON:  CT from January 13, 2021, chest x-ray January 18, 2021. FINDINGS: RIGHT-sided Port-A-Cath terminates at the caval to atrial junction. Trachea is midline. Cardiomediastinal contours are stable. Interstitial and airspace opacities throughout the chest worse in the LEFT mid to upper chest, RIGHT upper chest lung bases bilaterally with similar appearance. On limited assessment there is no acute skeletal process. EKG leads project over the chest. IMPRESSION: No interval change in the appearance of the chest since previous imaging with continued decreased lung volumes and patchy bilateral opacities.  Electronically Signed   By: Zetta Bills M.D.   On: 01/19/2021 07:59   DG Chest Port 1 View  Result Date: 01/18/2021 CLINICAL DATA:  Respiratory distress EXAM: PORTABLE CHEST 1 VIEW COMPARISON:  01/17/2021 FINDINGS: Cardiac shadow is stable. Right-sided chest wall port is again seen. Lungs are hypoinflated. Patchy airspace opacities again identified in both lungs left greater than right but stable from the prior exam. IMPRESSION: Patchy airspace opacities bilaterally stable from the prior exam. Electronically Signed   By: Inez Catalina M.D.   On: 01/18/2021 17:24   DG Chest Port 1 View  Result Date: 01/17/2021 CLINICAL DATA:  Worsening shortness of breath. EXAM: PORTABLE CHEST 1 VIEW COMPARISON:  01/14/2021 FINDINGS: 0826 hours. Low volume film. Patchy airspace disease again noted in both lungs, left greater than right. Cardiopericardial silhouette is at upper limits of normal for size. Right Port-A-Cath again noted. Telemetry leads overlie the chest. IMPRESSION: Low volume film with patchy bilateral airspace disease, left greater than right. No substantial change. Electronically Signed   By: Misty Stanley M.D.   On: 01/17/2021 08:53   DG Chest Port 1 View  Result Date: 01/14/2021 CLINICAL DATA:  Shortness of breath.  History of cancer.  Diabetes. EXAM: PORTABLE CHEST 1 VIEW.  Patient is rotated. COMPARISON:  Chest x-ray 01/13/2021, CT chest 01/13/2021 FINDINGS: Accessed right chest wall Port-A-Cath in stable position. The heart and mediastinal contours are within normal limits. Low lung volumes with persistent diffuse patchy airspace opacities. No pleural effusion. No pneumothorax. No acute osseous abnormality. Right upper quadrant surgical clips. IMPRESSION: Low lung volumes with persistent diffuse patchy airspace opacities. Electronically Signed   By: Iven Finn M.D.   On: 01/14/2021 21:08   DG Chest Port 1 View  Result Date: 01/13/2021 CLINICAL DATA:  Shortness of breath EXAM: PORTABLE  CHEST 1 VIEW COMPARISON:  Chest radiograph 07/29/2019 FINDINGS: There is a right chest wall port with the tip terminating in the lower SVC/cavoatrial junction. The cardiomediastinal silhouette is within normal limits. Lung volumes are low with unchanged asymmetric elevation of the right hemidiaphragm. There are increased interstitial markings with diffuse reticular opacities throughout both lungs. There is no focal consolidation. There is no significant pleural effusion. There is no pneumothorax. There is no acute osseous abnormality. There is gaseous distention of the stomach and large bowel in the right upper quadrant. IMPRESSION: Low lung volumes with suspected mild pulmonary interstitial edema. Atypical/viral infection could have a similar appearance. Electronically Signed   By: Valetta Mole M.D.   On: 01/13/2021 14:24      ASSESSMENT/PLAN   Acute hypoxemic respiratory failure - present  on admission  - COVID19 negative   - supplemental O2 during my evaluation 10l/min>>8L -Respiratory viral panel-negative  -serum fungitell- -legionella ab -nasal MRSA PCR-negative -Procalcitonin trend reviewed -strep pneumoniae ur AG -Histoplasma Ur Ag -sputum resp cultures -AFB sputum expectorated specimen -sputum cytology  -Agree with Cefepime empirically  -Reduced Solumedrol to 40 BID - 01/15/21>>50 BID -reviewed pertinent imaging with patient today - ESR and CRP are elevated  -PT/OT for d/c planning  -please encourage patient to use incentive spirometer few times each hour while hospitalized.   -continue Cellcept 250 bid >>500 bid     Acute blood loss anemia    - noted Hb trending down from 14 to 10 -    -fecal occult blood test    - may be due to GI bleed due to eliquis - monitor for grossly visible bleeding and stable vital sings    Thank you for allowing me to participate in the care of this patient.  Total face to face encounter time for this patient visit was >3mn. >50% of the time  was  spent in counseling and coordination of care.   Patient/Family are satisfied with care plan and all questions have been answered.  This document was prepared using Dragon voice recognition software and may include unintentional dictation errors.     FOttie Glazier M.D.  Division of [facility]

## 2021-01-19 NOTE — Progress Notes (Signed)
Inpatient Diabetes Program Recommendations  AACE/ADA: New Consensus Statement on Inpatient Glycemic Control   Target Ranges:  Prepandial:   less than 140 mg/dL      Peak postprandial:   less than 180 mg/dL (1-2 hours)      Critically ill patients:  140 - 180 mg/dL   Results for TAI, SKELLY" (MRN [id number]) as of 01/19/2021 11:24  Ref. Range 01/18/2021 08:02 01/18/2021 12:24 01/18/2021 15:32 01/18/2021 21:37 01/19/2021 07:33  Glucose-Capillary Latest Ref Range: 70 - 99 mg/dL 124 (H) 127 (H) 343 (H) 269 (H) 267 (H)   Review of Glycemic Control  Diabetes history: DM2 Outpatient Diabetes medications: Amaryl 2 mg daily, Metformin 500 mg TID Current orders for Inpatient glycemic control: Semglee 20 units daily, Novolog 5 units TID with meals, Novolog 0-20 units TID with meals, Novolog 0-5 units QHS; Solumedrol 60 mg Q12H   Inpatient Diabetes Program Recommendations:     Insulin: If steroids are continued, please consider increasing meal coverage to Novolog 10 units TID with meals if patient eats at least 50% of meals.  Thanks, Barnie Alderman, RN, MSN, CDE Diabetes Coordinator Inpatient Diabetes Program [phone] (Team Pager from 8am to 5pm)

## 2021-01-19 NOTE — Evaluation (Signed)
Physical Therapy Evaluation Patient Details Name: Edward Atkinson MRN: [id number] DOB: 04-May-1960 Today's Date: 01/19/2021  History of Present Illness  Pt is a 60 y.o. male with medical history significant for chronic hypoxic respiratory failure on 2 L continuous nasal cannula in the setting of interstitial lung disease, type 2 diabetes mellitus, pulmonary embolism in 2020 now chronically anticoagulated on Eliquis, who arrived to ED with shortness of breath. MD assessment includes:  acute on chronic hypoxic respiratory failure with hypoxia, possible sepsis due to bilateral pneumonia, rapid atrial fibrillation, anemia, and mild elevation of troponin with suspected demand ischemia.   Clinical Impression  Pt was pleasant and motivated to participate during the session. Pt required extra time and effort with functional tasks but no physical assistance.  Pt did require the bed to be slightly elevated to come to standing but once up was steady without LOB or buckling.  Pt ambulated from bed to chair with no reported adverse symptoms but SpO2 dropped to a low of 83% from a baseline of 95%. Pt educated on PLB in sitting with SpO2 gradually increasing back to >/= 90% in around 2 minutes on 45L HFNC, nsg notified.  Pt will benefit from HHPT upon discharge to safely address deficits listed in patient problem list for decreased caregiver assistance and eventual return to PLOF.         Recommendations for follow up therapy are one component of a multi-disciplinary discharge planning process, led by the attending physician.  Recommendations may be updated based on patient status, additional functional criteria and insurance authorization.  Follow Up Recommendations Home health PT;Supervision for mobility/OOB    Equipment Recommendations  Rolling walker with 5" wheels;3in1 (PT)    Recommendations for Other Services       Precautions / Restrictions Precautions Precautions: Fall Restrictions Weight Bearing  Restrictions: No      Mobility  Bed Mobility Overal bed mobility: Independent                  Transfers Overall transfer level: Needs assistance Equipment used: Rolling walker (2 wheeled) Transfers: Sit to/from Stand Sit to Stand: Min guard;From elevated surface         General transfer comment: Pt required EOB to be elevated to come to standing without assist  Ambulation/Gait Ambulation/Gait assistance: Min guard Gait Distance (Feet): 3 Feet Assistive device: Rolling walker (2 wheeled) Gait Pattern/deviations: Step-through pattern;Decreased step length - right;Decreased step length - left Gait velocity: decreased   General Gait Details: Pt able to take several steps from EOB to chair without LOB but with SpO2 dropping from 95% on 45L HFNC at rest to 83%, 2 min to return back to >/= 90% in sitting, nsg notified  Stairs            Wheelchair Mobility    Modified Rankin (Stroke Patients Only)       Balance Overall balance assessment: Needs assistance   Sitting balance-Leahy Scale: Normal     Standing balance support: Bilateral upper extremity supported;During functional activity Standing balance-Leahy Scale: Good Standing balance comment: Min lean on the RW for support                             Pertinent Vitals/Pain Pain Assessment: No/denies pain    Home Living Family/patient expects to be discharged to:: Private residence Living Arrangements: Spouse/significant other;Children Available Help at Discharge: Family;Available 24 hours/day Type of Home: House Home Access: Stairs to enter  Entrance Stairs-Rails: None Entrance Stairs-Number of Steps: 2 Home Layout: Two level;Able to live on main level with bedroom/bathroom Home Equipment: None      Prior Function Level of Independence: Independent         Comments: Ind amb mostly household distances without an AD, no fall history, uses facility w/c for community access (mostly  just for MD apts)     Hand Dominance        Extremity/Trunk Assessment   Upper Extremity Assessment Upper Extremity Assessment: Generalized weakness    Lower Extremity Assessment Lower Extremity Assessment: Generalized weakness       Communication   Communication: No difficulties  Cognition Arousal/Alertness: Awake/alert Behavior During Therapy: WFL for tasks assessed/performed Overall Cognitive Status: Within Functional Limits for tasks assessed                                        General Comments      Exercises Total Joint Exercises Ankle Circles/Pumps: AROM;Strengthening;Both;10 reps Quad Sets: 10 reps;Both;Strengthening Gluteal Sets: Strengthening;Both;10 reps Hip ABduction/ADduction: Strengthening;Both;10 reps Long Arc Quad: Strengthening;Both;10 reps Other Exercises Other Exercises: HEP education for BLE APs, QS, and GS x 10 each every 1-2 hours daily   Assessment/Plan    PT Assessment Patient needs continued PT services  PT Problem List Decreased strength;Decreased activity tolerance;Decreased balance;Decreased mobility;Decreased knowledge of use of DME;Cardiopulmonary status limiting activity       PT Treatment Interventions DME instruction;Gait training;Stair training;Therapeutic activities;Functional mobility training;Therapeutic exercise;Balance training;Patient/family education    PT Goals (Current goals can be found in the Care Plan section)  Acute Rehab PT Goals Patient Stated Goal: To get my strength back PT Goal Formulation: With patient Time For Goal Achievement: 02/01/21 Potential to Achieve Goals: Good    Frequency Min 2X/week   Barriers to discharge        Co-evaluation               AM-PAC PT "6 Clicks" Mobility  Outcome Measure Help needed turning from your back to your side while in a flat bed without using bedrails?: A Little Help needed moving from lying on your back to sitting on the side of a flat  bed without using bedrails?: A Little Help needed moving to and from a bed to a chair (including a wheelchair)?: A Little Help needed standing up from a chair using your arms (e.g., wheelchair or bedside chair)?: A Little Help needed to walk in hospital room?: A Little Help needed climbing 3-5 steps with a railing? : A Lot 6 Click Score: 17    End of Session Equipment Utilized During Treatment: Gait belt;Oxygen Activity Tolerance: Other (comment) (Limited by SpO2 desaturation with gait) Patient left: in chair;with call bell/phone within reach;with chair alarm set;with family/visitor present Nurse Communication: Mobility status;Other (comment) (SpO2 to 83% after amb) PT Visit Diagnosis: Muscle weakness (generalized) (M62.81);Difficulty in walking, not elsewhere classified (R26.2)    Time: 7124-5809 PT Time Calculation (min) (ACUTE ONLY): 33 min   Charges:   PT Evaluation $PT Eval Moderate Complexity: 1 Mod PT Treatments $Therapeutic Exercise: 8-22 mins        D. Scott Caty Tessler PT, DPT 01/19/21, 12:00 PM

## 2021-01-20 ENCOUNTER — Inpatient Hospital Stay: Payer: BC Managed Care – PPO

## 2021-01-20 DIAGNOSIS — D649 Anemia, unspecified: Secondary | ICD-10-CM | POA: Diagnosis not present

## 2021-01-20 DIAGNOSIS — I480 Paroxysmal atrial fibrillation: Secondary | ICD-10-CM

## 2021-01-20 LAB — RENAL FUNCTION PANEL
Albumin: 2.3 g/dL — ABNORMAL LOW (ref 3.5–5.0)
Anion gap: 6 (ref 5–15)
BUN: 25 mg/dL — ABNORMAL HIGH (ref 8–23)
CO2: 34 mmol/L — ABNORMAL HIGH (ref 22–32)
Calcium: 8.5 mg/dL — ABNORMAL LOW (ref 8.9–10.3)
Chloride: 97 mmol/L — ABNORMAL LOW (ref 98–111)
Creatinine, Ser: 0.53 mg/dL — ABNORMAL LOW (ref 0.61–1.24)
GFR, Estimated: >60 mL/min (ref >60)
Glucose, Bld: 250 mg/dL — ABNORMAL HIGH (ref 70–99)
Phosphorus: 2.7 mg/dL (ref 2.5–4.6)
Potassium: 4.3 mmol/L (ref 3.5–5.1)
Sodium: 137 mmol/L (ref 135–145)

## 2021-01-20 LAB — GLUCOSE, CAPILLARY
Glucose-Capillary: 273 mg/dL — ABNORMAL HIGH (ref 70–99)
Glucose-Capillary: 387 mg/dL — ABNORMAL HIGH (ref 70–99)
Glucose-Capillary: 318 mg/dL — ABNORMAL HIGH (ref 70–99)
Glucose-Capillary: 57 mg/dL — ABNORMAL LOW (ref 70–99)
Glucose-Capillary: 190 mg/dL — ABNORMAL HIGH (ref 70–99)

## 2021-01-20 LAB — CBC
HCT: 33.7 % — ABNORMAL LOW (ref 39.0–52.0)
Hemoglobin: 10.9 g/dL — ABNORMAL LOW (ref 13.0–17.0)
MCH: 27.7 pg (ref 26.0–34.0)
MCHC: 32.3 g/dL (ref 30.0–36.0)
MCV: 85.5 fL (ref 80.0–100.0)
Platelets: 346 10*3/uL (ref 150–400)
RBC: 3.94 MIL/uL — ABNORMAL LOW (ref 4.22–5.81)
RDW: 16.8 % — ABNORMAL HIGH (ref 11.5–15.5)
WBC: 11.3 10*3/uL — ABNORMAL HIGH (ref 4.0–10.5)
nRBC: 0 % (ref 0.0–0.2)

## 2021-01-20 LAB — C-REACTIVE PROTEIN: CRP: 2.6 mg/dL — ABNORMAL HIGH (ref <1.0)

## 2021-01-20 LAB — DIGOXIN LEVEL: Digoxin Level: 0.5 ng/mL — ABNORMAL LOW (ref 0.8–2.0)

## 2021-01-20 MED ORDER — INSULIN ASPART 100 UNIT/ML IJ SOLN
15.0000 [IU] | Freq: Three times a day (TID) | INTRAMUSCULAR | Status: DC
  Administered 2021-01-20 – 2021-01-23 (×10): 15 [IU] via SUBCUTANEOUS
  Filled 2021-01-20 (×10): qty 1

## 2021-01-20 MED ORDER — FUROSEMIDE 10 MG/ML IJ SOLN
20.0000 mg | Freq: Two times a day (BID) | INTRAMUSCULAR | Status: DC
  Administered 2021-01-20 – 2021-01-25 (×10): 20 mg via INTRAVENOUS
  Filled 2021-01-20 (×10): qty 2

## 2021-01-20 MED ORDER — INSULIN GLARGINE-YFGN 100 UNIT/ML ~~LOC~~ SOLN
5.0000 [IU] | Freq: Once | SUBCUTANEOUS | Status: AC
  Administered 2021-01-20: 5 [IU] via SUBCUTANEOUS
  Filled 2021-01-20: qty 0.05

## 2021-01-20 MED ORDER — INSULIN GLARGINE-YFGN 100 UNIT/ML ~~LOC~~ SOLN
25.0000 [IU] | Freq: Every day | SUBCUTANEOUS | Status: DC
  Administered 2021-01-21 – 2021-01-24 (×4): 25 [IU] via SUBCUTANEOUS
  Filled 2021-01-20 (×4): qty 0.25

## 2021-01-20 NOTE — Progress Notes (Signed)
Received a call from [place] who had checked patient's BG, and it was 57.  Pt was given orange juice.  Pt denied any symptoms, other than feeling a little "clammy".  Will recheck.  2250 - repeat BG 190

## 2021-01-20 NOTE — Progress Notes (Signed)
Mobility Specialist - Progress Note   01/20/21 1500  Mobility  Activity Sat and stood x 3  Level of Assistance Standby assist, set-up cues, supervision of patient - no hands on  Assistive Device None  Distance Ambulated (ft) 0 ft  Mobility Sit up in bed/chair position for meals  Mobility Response Tolerated fair  Mobility performed by Mobility specialist  $Mobility charge 1 Mobility    Pre-mobility: 100 HR, 97% SpO2 During mobility: 115 HR, 78% SpO2 Post-mobility: 94 HR, 94% SpO2   Pt lying in bed upon arrival, utilizing 12L HFNC. Pt performed STSx3 at EOB, O2 desat to a low of 78%. Extensive recovery of ~4 minutes. Does voice fatigue after activity. Pt returned to bed with needs in reach. RN notified.    Kathee Delton Mobility Specialist 01/20/21, 3:35 PM

## 2021-01-20 NOTE — Progress Notes (Signed)
[facility]  SUBJECTIVE: Patient laying in bed, denies chest pain, or palpitations   Vitals:   01/20/21 0008 01/20/21 0428 01/20/21 0500 01/20/21 0720  BP: 101/76 115/64  101/89  Pulse: 86 84  70  Resp: 18 18  18   Temp: 97.9 F (36.6 C) 97.7 F (36.5 C)  97.9 F (36.6 C)  TempSrc: Oral Oral  Oral  SpO2: 96% 96%  94%  Weight: 70.3 kg  70.3 kg   Height:         Intake/Output Summary (Last 24 hours) at 01/20/2021 0753 Last data filed at 01/20/2021 5188 Gross per 24 hour  Intake 720 ml  Output 1915 ml  Net -1195 ml      PHYSICAL EXAM  General: Well developed, well nourished, in no acute distress HEENT:  Normocephalic and atramatic Neck:  No JVD.  Lungs: Clear bilaterally to auscultation and percussion. Heart: HRRR . Normal S1 and S2 without gallops or murmurs.  Abdomen: Bowel sounds are positive, abdomen soft and non-tender  Msk:  Back normal, normal gait. Normal strength and tone for age. Extremities: No clubbing, cyanosis or edema.   Neuro: Alert and oriented X 3. Psych:  Good affect, responds appropriately   LABS: Basic Metabolic Panel: Recent Labs    01/19/21 0443 01/20/21 0437  NA 136 137  K 4.1 4.3  CL 98 97*  CO2 33* 34*  GLUCOSE 263* 250*  BUN 24* 25*  CREATININE 0.41* 0.53*  CALCIUM 8.6* 8.5*  PHOS  --  2.7   Liver Function Tests: Recent Labs    01/20/21 0437  ALBUMIN 2.3*   No results for input(s): LIPASE, AMYLASE in the last 72 hours. CBC: Recent Labs    01/19/21 0443 01/20/21 0437  WBC 11.2* 11.3*  HGB 11.2* 10.9*  HCT 34.6* 33.7*  MCV 84.4 85.5  PLT 303 346   Cardiac Enzymes: No results for input(s): CKTOTAL, CKMB, CKMBINDEX, TROPONINI in the last 72 hours. BNP: Invalid input(s): POCBNP D-Dimer: No results for input(s): DDIMER in the last 72 hours. Hemoglobin A1C: No results for input(s): HGBA1C in the last 72 hours. Fasting Lipid Panel: No results for input(s): CHOL, HDL, LDLCALC, TRIG, CHOLHDL, LDLDIRECT in the last 72  hours. Thyroid Function Tests: No results for input(s): TSH, T4TOTAL, T3FREE, THYROIDAB in the last 72 hours.  Invalid input(s): FREET3 Anemia Panel: No results for input(s): VITAMINB12, FOLATE, FERRITIN, TIBC, IRON, RETICCTPCT in the last 72 hours.  DG Chest Port 1 View  Result Date: 01/20/2021 CLINICAL DATA:  Pneumonitis EXAM: PORTABLE CHEST 1 VIEW COMPARISON:  01/19/2021 FINDINGS: Shallow inspiration. Patchy infiltrates in the left lung and right lung base, similar to prior study and likely multifocal pneumonia. No pleural effusions. No pneumothorax. Mediastinal contours appear intact. Heart size is normal. Power port type central venous catheter on the right with tip over the cavoatrial junction region. Surgical clips in the right upper quadrant. IMPRESSION: Shallow inspiration. Patchy infiltrates in both lungs, likely multifocal pneumonia, without change. Electronically Signed   By: Lucienne Capers M.D.   On: 01/20/2021 03:56   DG Chest Port 1 View  Result Date: 01/19/2021 CLINICAL DATA:  Abnormality on previous chest x-ray in a 60 year old male. EXAM: PORTABLE CHEST 1 VIEW COMPARISON:  CT from January 13, 2021, chest x-ray January 18, 2021. FINDINGS: RIGHT-sided Port-A-Cath terminates at the caval to atrial junction. Trachea is midline. Cardiomediastinal contours are stable. Interstitial and airspace opacities throughout the chest worse in the LEFT mid to upper chest, RIGHT upper chest lung  bases bilaterally with similar appearance. On limited assessment there is no acute skeletal process. EKG leads project over the chest. IMPRESSION: No interval change in the appearance of the chest since previous imaging with continued decreased lung volumes and patchy bilateral opacities. Electronically Signed   By: Zetta Bills M.D.   On: 01/19/2021 07:59   DG Chest Port 1 View  Result Date: 01/18/2021 CLINICAL DATA:  Respiratory distress EXAM: PORTABLE CHEST 1 VIEW COMPARISON:  01/17/2021  FINDINGS: Cardiac shadow is stable. Right-sided chest wall port is again seen. Lungs are hypoinflated. Patchy airspace opacities again identified in both lungs left greater than right but stable from the prior exam. IMPRESSION: Patchy airspace opacities bilaterally stable from the prior exam. Electronically Signed   By: Inez Catalina M.D.   On: 01/18/2021 17:24     Echo LVEF 60 to 65% by 2D echocardiogram 01/30/2019  TELEMETRY: Atrial fibrillation 102 bpm:  ASSESSMENT AND PLAN:  Principal Problem:   Acute on chronic respiratory failure with hypoxia (HCC) Active Problems:   Type 2 diabetes mellitus without complication, without long-term current use of insulin (HCC)   SOB (shortness of breath)   Interstitial lung disease (HCC)   Severe sepsis (HCC)   CAP (community acquired pneumonia)   Elevated troponin   Protein calorie malnutrition (HCC)   Protein-calorie malnutrition, severe    1.  Atrial fibrillation with rapid ventricular rate, heart rate improved and stable on current dose of digoxin and Cardizem CD.  Patient on Eliquis with history of pulmonary embolus. 2.  Respiratory failure, and patient with COVID-19 interstitial lung disease, slowly improving 3.  History of pulmonary embolus, on Eliquis 4.  Borderline elevated high-sensitivity troponin (13, 22, 8), in the absence of chest pain or ischemic ECG changes, very likely due to demand supply ischemia in setting of atrial fibrillation with rapid ventricular rate and respiratory failure   Recommendations   1.  Agree with overall current therapy 2.  Continue Eliquis for stroke prevention 3.  Continue digoxin 0.125 mg p.o. daily for rate control 4.  Defer amiodarone in light of patient's history of interstitial lung disease 5.  Continue Cardizem CD to 180 mg daily 6.  Follow-up with me 1 to 2 weeks after discharge  Sign off for now, please call if any questions   Isaias Cowman, MD, PhD, [facility] 01/20/2021 7:53 AM

## 2021-01-20 NOTE — Plan of Care (Signed)

## 2021-01-20 NOTE — Progress Notes (Addendum)
PROGRESS NOTE    SHOWN DISSINGER  YTW:[id number] DOB: [birth year] DOA: 01/13/2021 PCP: Rusty Aus, MD    Assessment & Plan:   Principal Problem:   Acute on chronic respiratory failure with hypoxia [facility]) Active Problems:   Type 2 diabetes mellitus without complication, without long-term current use of insulin (HCC)   SOB (shortness of breath)   Interstitial lung disease (HCC)   Severe sepsis ([place])   CAP (community acquired pneumonia)   Elevated troponin   Protein calorie malnutrition (HCC)   Protein-calorie malnutrition, severe   Acute on chronic hypoxic respiratory failure: continue on supplemental oxygen and wean back to baseline as tolerated. On 2L [place] at home & currently on HFNC. CTA chest negative for PE, &  possible progressive ILD vs pneumonia. Continue on IV steroids, bronchodilators and cellcept. Continue on bactrim for PCP prophylaxis & continue on acyclovir for HSV prophylaxis. COVID19, influenza are both neg   Sepsis : met criteria w/ leukocytosis, tachycardia and tachypnea & likely pneumonia. Continue on IV azithromycin. Resolved    Likely PAF: continue on cardizem, digoxin & eliquis as per cardio. Cardio signed off and pt will need f/u outpatient w/ Dr. Saralyn Pilar in 1-2 weeks after discharge    Normocytic anemia: H&H are labile. No indication for a transfusion currently    Mild elevation of troponin: likely secondary to demand ischemia    DM2: well controlled. Continue on glarigine, SSI w/ accuchecks    Hx of pulmonary embolism: continue on eliquis    Hx of follicular cell lymphoma: management per onco as an outpatient    DVT prophylaxis: eliquis  Code Status: full  Family Communication: discussed pt's care w/ pt's family at bedside and answered their questions  Disposition Plan: likely d/c back home   Level of care: Progressive Cardiac  Status is: Inpatient  Remains inpatient appropriate because:IV treatments appropriate due to intensity of illness or  inability to take PO and Inpatient level of care appropriate due to severity of illness  Dispo: The patient is from: Home              Anticipated d/c is to: Home              Patient currently is not medically stable to d/c.   Difficult to place patient : unclear   Consultants:  Cardio Pulmon   Procedures:   Antimicrobials: azithromycin    Subjective: Pt c/o shortness of breath   Objective: Vitals:   01/20/21 0008 01/20/21 0428 01/20/21 0500 01/20/21 0720  BP: 101/76 115/64  101/89  Pulse: 86 84  70  Resp: _0 Temp: 97.9 F (36.6 C) 97.7 F (36.5 C)  97.9 F (36.6 C)  TempSrc: Oral Oral  Oral  SpO2: 96% 96%  94%  Weight: 70.3 kg  70.3 kg   Height:        Intake/Output Summary (Last 24 hours) at 01/20/2021 0739 Last data filed at 01/20/2021 0723 Gross per 24 hour  Intake 720 ml  Output 1915 ml  Net -1195 ml   Filed Weights   01/19/21 0600 01/20/21 0008 01/20/21 0500  Weight: 70.9 kg 70.3 kg 70.3 kg    Examination:  General exam: Appears calm and comfortable  Respiratory system: diminished breath sounds b/l  Cardiovascular system: S1 & S2+. No rubs, gallops or clicks.  Gastrointestinal system: Abdomen is nondistended, soft and nontender. Normal bowel sounds heard. Central nervous system: Alert and oriented. Moves all extremities  Psychiatry: Judgement and  insight appear normal. Flat mood and affect   Data Reviewed: I have personally reviewed following labs and imaging studies  CBC: Recent Labs  Lab 01/13/21 1333 01/14/21 0702 01/15/21 0440 01/16/21 0610 01/18/21 0424 01/19/21 0443 01/20/21 0437  WBC 12.3* 3.2* 7.0 10.3 12.4* 11.2* 11.3*  NEUTROABS 9.5* 2.6  --   --   --   --   --   HGB 13.5 11.2* 10.2* 10.3* 11.2* 11.2* 10.9*  HCT 41.6 34.7* 32.0* 32.8* 35.2* 34.6* 33.7*  MCV 84.9 82.8 82.5 84.1 85.0 84.4 85.5  PLT 440* 320 332 363 333 303 263   Basic Metabolic Panel: Recent Labs  Lab 01/13/21 1333 01/14/21 0702 01/15/21 0440  01/16/21 0610 01/18/21 0424 01/19/21 0443 01/20/21 0437  NA 131* 136 134* 137 137 136 137  K 4.3 4.2 3.9 4.2 4.0 4.1 4.3  CL 92* 98 99 101 97* 98 97*  CO2 _0 33* 33* 34*  GLUCOSE 181* 244* 245* 253* 94 263* 250*  BUN _1 24* 25*  CREATININE 0.79 0.60* 0.61 0.55* 0.58* 0.41* 0.53*  CALCIUM 9.3 9.1 8.7* 8.3* 8.9 8.6* 8.5*  MG 2.3 2.2  --  2.1  --   --   --   PHOS  --  4.3  --  3.3  --   --  2.7   GFR: Estimated Creatinine Clearance: 95.2 mL/min (A) (by C-G formula based on SCr of 0.53 mg/dL (L)). Liver Function Tests: Recent Labs  Lab 01/13/21 1333 01/14/21 0702 01/20/21 0437  AST 50* 30  --   ALT 37 33  --   ALKPHOS 109 89  --   BILITOT 0.7 1.1  --   PROT 7.2 6.6  --   ALBUMIN 3.1* 3.0* 2.3*   No results for input(s): LIPASE, AMYLASE in the last 168 hours. No results for input(s): AMMONIA in the last 168 hours. Coagulation Profile: No results for input(s): INR, PROTIME in the last 168 hours. Cardiac Enzymes: No results for input(s): CKTOTAL, CKMB, CKMBINDEX, TROPONINI in the last 168 hours. BNP (last 3 results) No results for input(s): PROBNP in the last 8760 hours. HbA1C: No results for input(s): HGBA1C in the last 72 hours. CBG: Recent Labs  Lab 01/19/21 0733 01/19/21 1140 01/19/21 1649 01/19/21 2055 01/20/21 0721  GLUCAP 267* 414* 379* 307* 273*   Lipid Profile: No results for input(s): CHOL, HDL, LDLCALC, TRIG, CHOLHDL, LDLDIRECT in the last 72 hours. Thyroid Function Tests: No results for input(s): TSH, T4TOTAL, FREET4, T3FREE, THYROIDAB in the last 72 hours. Anemia Panel: No results for input(s): VITAMINB12, FOLATE, FERRITIN, TIBC, IRON, RETICCTPCT in the last 72 hours. Sepsis Labs: Recent Labs  Lab 01/13/21 1909 01/13/21 1911 01/14/21 0702 01/15/21 0440  PROCALCITON  --  0.96 0.84 0.41  LATICACIDVEN 0.9  --  1.4  --     Recent Results (from the past 240 hour(s))  Resp Panel by RT-PCR (Flu A&B, Covid) Nasopharyngeal Swab      Status: None   Collection Time: 01/13/21  1:33 PM   Specimen: Nasopharyngeal Swab; Nasopharyngeal(NP) swabs in vial transport medium  Result Value Ref Range Status   SARS Coronavirus 2 by RT PCR NEGATIVE NEGATIVE Final    Comment: (NOTE) SARS-CoV-2 target nucleic acids are NOT DETECTED.  The SARS-CoV-2 RNA is generally detectable in upper respiratory specimens during the acute phase of infection. The lowest concentration of SARS-CoV-2 viral copies this assay can detect is 138 copies/mL. A negative result does not  preclude SARS-Cov-2 infection and should not be used as the sole basis for treatment or other patient management decisions. A negative result may occur with  improper specimen collection/handling, submission of specimen other than nasopharyngeal swab, presence of viral mutation(s) within the areas targeted by this assay, and inadequate number of viral copies(<138 copies/mL). A negative result must be combined with clinical observations, patient history, and epidemiological information. The expected result is Negative.  Fact Sheet for Patients:  EntrepreneurPulse.com.au  Fact Sheet for Healthcare Providers:  IncredibleEmployment.be  This test is no t yet approved or cleared by the [place] FDA and  has been authorized for detection and/or diagnosis of SARS-CoV-2 by FDA under an Emergency Use Authorization (EUA). This EUA will remain  in effect (meaning this test can be used) for the duration of the COVID-19 declaration under Section 564(b)(1) of the Act, 21 U.S.C.section 360bbb-3(b)(1), unless the authorization is terminated  or revoked sooner.       Influenza A by PCR NEGATIVE NEGATIVE Final   Influenza B by PCR NEGATIVE NEGATIVE Final    Comment: (NOTE) The Xpert Xpress SARS-CoV-2/FLU/RSV plus assay is intended as an aid in the diagnosis of influenza from Nasopharyngeal swab specimens and should not be used as a sole basis  for treatment. Nasal washings and aspirates are unacceptable for Xpert Xpress SARS-CoV-2/FLU/RSV testing.  Fact Sheet for Patients: EntrepreneurPulse.com.au  Fact Sheet for Healthcare Providers: IncredibleEmployment.be  This test is not yet approved or cleared by the [place] FDA and has been authorized for detection and/or diagnosis of SARS-CoV-2 by FDA under an Emergency Use Authorization (EUA). This EUA will remain in effect (meaning this test can be used) for the duration of the COVID-19 declaration under Section 564(b)(1) of the Act, 21 U.S.C. section 360bbb-3(b)(1), unless the authorization is terminated or revoked.  Performed at [facility], [address] [place], [place] [address]   Blood culture (routine x 2)     Status: None   Collection Time: 01/13/21  6:20 PM   Specimen: BLOOD  Result Value Ref Range Status   Specimen Description BLOOD RIGHT ANTECUBITAL  Final   Special Requests   Final    BOTTLES DRAWN AEROBIC AND ANAEROBIC Blood Culture adequate volume   Culture   Final    NO GROWTH 5 DAYS Performed at [facility], [address] [place], [place] [address]    Report Status 01/18/2021 FINAL  Final  Blood culture (routine x 2)     Status: None   Collection Time: 01/13/21  6:20 PM   Specimen: BLOOD  Result Value Ref Range Status   Specimen Description BLOOD LEFT ANTECUBITAL  Final   Special Requests   Final    BOTTLES DRAWN AEROBIC AND ANAEROBIC Blood Culture adequate volume   Culture   Final    NO GROWTH 5 DAYS Performed at [facility], [address] [place], [place] [address]    Report Status 01/18/2021 FINAL  Final  Respiratory (~20 pathogens) panel by PCR     Status: None   Collection Time: 01/15/21  2:01 PM   Specimen: Nasopharyngeal Swab; Respiratory  Result Value Ref Range Status   Adenovirus NOT DETECTED NOT DETECTED Final   Coronavirus 229E NOT DETECTED NOT DETECTED  Final    Comment: (NOTE) The Coronavirus on the Respiratory Panel, DOES NOT test for the novel  Coronavirus (2019 nCoV)    Coronavirus HKU1 NOT DETECTED NOT DETECTED Final   Coronavirus NL63 NOT DETECTED NOT DETECTED Final  Coronavirus OC43 NOT DETECTED NOT DETECTED Final   Metapneumovirus NOT DETECTED NOT DETECTED Final   Rhinovirus / Enterovirus NOT DETECTED NOT DETECTED Final   Influenza A NOT DETECTED NOT DETECTED Final   Influenza B NOT DETECTED NOT DETECTED Final   Parainfluenza Virus 1 NOT DETECTED NOT DETECTED Final   Parainfluenza Virus 2 NOT DETECTED NOT DETECTED Final   Parainfluenza Virus 3 NOT DETECTED NOT DETECTED Final   Parainfluenza Virus 4 NOT DETECTED NOT DETECTED Final   Respiratory Syncytial Virus NOT DETECTED NOT DETECTED Final   Bordetella pertussis NOT DETECTED NOT DETECTED Final   Bordetella Parapertussis NOT DETECTED NOT DETECTED Final   Chlamydophila pneumoniae NOT DETECTED NOT DETECTED Final   Mycoplasma pneumoniae NOT DETECTED NOT DETECTED Final    Comment: Performed at [facility], [place] [address] [place], [place] [address]  MRSA Next Gen by PCR, Nasal     Status: None   Collection Time: 01/15/21  2:02 PM   Specimen: Nasal Mucosa; Nasal Swab  Result Value Ref Range Status   MRSA by PCR Next Gen NOT DETECTED NOT DETECTED Final    Comment: (NOTE) The GeneXpert MRSA Assay (FDA approved for NASAL specimens only), is one component of a comprehensive MRSA colonization surveillance program. It is not intended to diagnose MRSA infection nor to guide or monitor treatment for MRSA infections. Test performance is not FDA approved in patients less than 36 years old. Performed at [facility], [address], [place],  [address]          Radiology Studies: DG Chest [place] 1 View  Result Date: 01/20/2021 CLINICAL DATA:  Pneumonitis EXAM: PORTABLE CHEST 1 VIEW COMPARISON:  01/19/2021 FINDINGS: Shallow inspiration. Patchy infiltrates  in the left lung and right lung base, similar to prior study and likely multifocal pneumonia. No pleural effusions. No pneumothorax. Mediastinal contours appear intact. Heart size is normal. Power port type central venous catheter on the right with tip over the cavoatrial junction region. Surgical clips in the right upper quadrant. IMPRESSION: Shallow inspiration. Patchy infiltrates in both lungs, likely multifocal pneumonia, without change. Electronically Signed   By: Lucienne Capers M.D.   On: 01/20/2021 03:56   DG Chest Port 1 View  Result Date: 01/19/2021 CLINICAL DATA:  Abnormality on previous chest x-ray in a 60 year old male. EXAM: PORTABLE CHEST 1 VIEW COMPARISON:  CT from January 13, 2021, chest x-ray January 18, 2021. FINDINGS: RIGHT-sided Port-A-Cath terminates at the caval to atrial junction. Trachea is midline. Cardiomediastinal contours are stable. Interstitial and airspace opacities throughout the chest worse in the LEFT mid to upper chest, RIGHT upper chest lung bases bilaterally with similar appearance. On limited assessment there is no acute skeletal process. EKG leads project over the chest. IMPRESSION: No interval change in the appearance of the chest since previous imaging with continued decreased lung volumes and patchy bilateral opacities. Electronically Signed   By: Zetta Bills M.D.   On: 01/19/2021 07:59   DG Chest Port 1 View  Result Date: 01/18/2021 CLINICAL DATA:  Respiratory distress EXAM: PORTABLE CHEST 1 VIEW COMPARISON:  01/17/2021 FINDINGS: Cardiac shadow is stable. Right-sided chest wall port is again seen. Lungs are hypoinflated. Patchy airspace opacities again identified in both lungs left greater than right but stable from the prior exam. IMPRESSION: Patchy airspace opacities bilaterally stable from the prior exam. Electronically Signed   By: Inez Catalina M.D.   On: 01/18/2021 17:24        Scheduled Meds:  acyclovir  400 mg Oral BID   apixaban  5 mg Oral  BID   Chlorhexidine Gluconate Cloth  6 each Topical Daily   digoxin  0.125 mg Oral Daily   diltiazem  180 mg Oral Daily   feeding supplement (NEPRO CARB STEADY)  237 mL Oral TID BM   guaiFENesin  1,200 mg Oral BID   insulin aspart  0-20 Units Subcutaneous TID WC   insulin aspart  0-5 Units Subcutaneous QHS   insulin aspart  10 Units Subcutaneous TID WC   insulin glargine-yfgn  20 Units Subcutaneous Daily   methylPREDNISolone (SOLU-MEDROL) injection  50 mg Intravenous Q12H   multivitamin with minerals  1 tablet Oral Daily   mycophenolate  500 mg Oral BID   sodium chloride flush  10-40 mL Intracatheter Q12H   sulfamethoxazole-trimethoprim  1 tablet Oral Daily   Continuous Infusions:  azithromycin 500 mg (01/19/21 1706)     LOS: 7 days    Time spent: 33 mins     Wyvonnia Dusky, MD [facility] Pager [phone]  If 7PM-7AM, please contact night-coverage  01/20/2021, 7:39 AM

## 2021-01-20 NOTE — Progress Notes (Signed)
Pulmonary Medicine          Date: 01/20/2021,   MRN# [id number] Edward Atkinson 1960-10-07     AdmissionWeight: 63.5 kg                 CurrentWeight: 70.3 kg   Referring physician: Dr Roderic Palau   CHIEF COMPLAINT:   Acute on chronic hypoxemic respiratory failure   HISTORY OF PRESENT ILLNESS   60 yo M w/hx of chronic hypoxemia and ILD post COVID19, T2DM, PE on eliquis, Lymphoma s/p Rituxan which is completed.  Came in due to worsening SOB and DOE specifically mild exertional defacation/urination with feelings of presyncope and severe dyspnea which started to get worse post tapering from steroid appx few days prior to onset of symptom progression.  He was placed on solumedrol and reported mild improvement.  He is on 10L /min [place] during my evaluation.  He had CT chest done with PE protocol and noted to have bilateral GGO infiltrates worse compared to June study. PCCM consultation for further evaluation and management.     01/17/21-  patient seems to be slighly clinically worse post reduction of IV steroids yesterday indicative of ongoing ILD exacerbation, thus far infectious workup is negative but inflammatory biomarkers are highly elevated also pointing towards ILD exacerbation.  There is new CXR in process this morning for interval changes. We discussed increasing steroids slightly and giving more time.  I will also initiate steroid sparing immunomodulation with BID cellcept as patient will likely need therapy for prolonged time period.  Compensatory tachycardia noted appreciate cardiology team.  Sugars with peaks and valleys with adjusted steroid therapy.  Patient with muscle wasting and protein calorie malnutrition but eating better this am.  01/18/21- patient is on HFNC at 60%, feels better, has not been able to tolerate BIPAP.  Wife is at bedside.  BP is borderline low MAP 76.  He has negative infectious workup and thus far ILD exacerbation seems to be main differential. He  continues to have peaks and valleys in blood glucose.  We started steroid sparing regimen with cellcept bid 250 mg and will increase dose as we taper off prednisone.  Remains tachyarrtythmic but improved. Appreciate everyone involved.     01/19/21- patient seen and examined at bedside.  Wife present during evaluation.  Ow HFNC weaned to 41%/45L/MIN.  CRP is still up, have increased cellcept and reducing solumedrol.  Patient felt stronger physically and was able to get OOB to chair. Cough is less now.  He is non-labored with breathing no accessory mm use today. He had dark stools with reduced hb, FOBT ordred.  Haptoglobin is elevated. Pathology slide review ordered.   01/20/21- patient is essentially unchanged from yesterday from pulmonary perspective. CXR also unchanged,  bloodwork with reduction in CRP.  Plan to wean O2 and continue OOB with PT/OT,  Continue current steroid dose and Cellcept.    PAST MEDICAL HISTORY   Past Medical History:  Diagnosis Date  . Chronic respiratory failure with hypoxia (HCC)    2L [place] continuous  . COVID-19   . Diabetes mellitus without complication ([place])   . Diverticulitis   . Follicular lymphoma of intra-abdominal lymph nodes ([place]) 02/15/2019  . ILD (interstitial lung disease) ([place])      SURGICAL HISTORY   Past Surgical History:  Procedure Laterality Date  . CHOLECYSTECTOMY    . COLON RESECTION     DUE TO DIVERTICULITIS  . FLEXIBLE BRONCHOSCOPY N/A 12/02/2019   Procedure: FLEXIBLE  BRONCHOSCOPY;  Surgeon: Ottie Glazier, MD;  Location: [facility];  Service: Thoracic;  Laterality: N/A;  . PORTA CATH INSERTION N/A 03/11/2019   Procedure: PORTA CATH INSERTION;  Surgeon: Algernon Huxley, MD;  Location: [facility];  Service: Cardiovascular;  Laterality: N/A;  . PULMONARY THROMBECTOMY N/A 01/29/2019   Procedure: PULMONARY THROMBECTOMY;  Surgeon: Algernon Huxley, MD;  Location: [facility];  Service: Cardiovascular;  Laterality: N/A;     FAMILY  HISTORY   Family History  Problem Relation Age of Onset  . Diabetes Brother      SOCIAL HISTORY   Social History   Tobacco Use  . Smoking status: Never  . Smokeless tobacco: Never  Vaping Use  . Vaping Use: Never used  Substance Use Topics  . Alcohol use: Yes    Alcohol/week: 0.0 - 2.0 standard drinks    Comment: occasional  . Drug use: Never     MEDICATIONS    Home Medication:    Current Medication:  Current Facility-Administered Medications:  .  acetaminophen (TYLENOL) tablet 650 mg, 650 mg, Oral, Q6H PRN, 650 mg at 01/18/21 1411 **OR** acetaminophen (TYLENOL) suppository 650 mg, 650 mg, Rectal, Q6H PRN, Howerter, Justin B, DO .  acyclovir (ZOVIRAX) 200 MG capsule 400 mg, 400 mg, Oral, BID, Kathie Dike, MD, 400 mg at 01/20/21 0847 .  apixaban (ELIQUIS) tablet 5 mg, 5 mg, Oral, BID, Beers, Shanon Brow, RPH, 5 mg at 01/20/21 0848 .  azithromycin (ZITHROMAX) 500 mg in sodium chloride 0.9 % 250 mL IVPB, 500 mg, Intravenous, Q24H, Wyvonnia Dusky, MD, Last Rate: 250 mL/hr at 01/19/21 1706, 500 mg at 01/19/21 1706 .  Chlorhexidine Gluconate Cloth 2 % PADS 6 each, 6 each, Topical, Daily, Kathie Dike, MD, 6 each at 01/20/21 [phone] .  digoxin (LANOXIN) tablet 0.125 mg, 0.125 mg, Oral, Daily, Memon, Jolaine Artist, MD, 0.125 mg at 01/20/21 0847 .  diltiazem (CARDIZEM CD) 24 hr capsule 180 mg, 180 mg, Oral, Daily, Paraschos, Alexander, MD, 180 mg at 01/20/21 0848 .  feeding supplement (NEPRO CARB STEADY) liquid 237 mL, 237 mL, Oral, TID BM, Memon, Jehanzeb, MD, 237 mL at 01/20/21 1248 .  furosemide (LASIX) injection 20 mg, 20 mg, Intravenous, BID, Mycah Mcdougall, MD .  guaiFENesin (MUCINEX) 12 hr tablet 1,200 mg, 1,200 mg, Oral, BID, Kathie Dike, MD, 1,200 mg at 01/20/21 0847 .  guaiFENesin-dextromethorphan (ROBITUSSIN DM) 100-10 MG/5ML syrup 5 mL, 5 mL, Oral, Q4H PRN, Kathie Dike, MD, 5 mL at 01/17/21 0648 .  insulin aspart (novoLOG) injection 0-20 Units, 0-20 Units,  Subcutaneous, TID WC, Kathie Dike, MD, 20 Units at 01/20/21 1246 .  insulin aspart (novoLOG) injection 0-5 Units, 0-5 Units, Subcutaneous, QHS, Kathie Dike, MD, 4 Units at 01/19/21 2135 .  insulin aspart (novoLOG) injection 15 Units, 15 Units, Subcutaneous, TID WC, Wyvonnia Dusky, MD, 15 Units at 01/20/21 1246 .  [START ON 01/21/2021] insulin glargine-yfgn (SEMGLEE) injection 25 Units, 25 Units, Subcutaneous, Daily, Wyvonnia Dusky, MD .  insulin glargine-yfgn [facility]) injection 5 Units, 5 Units, Subcutaneous, Once, Wyvonnia Dusky, MD .  levalbuterol Penne Lash) nebulizer solution 1.25 mg, 1.25 mg, Nebulization, Q4H PRN, Howerter, Justin B, DO, 1.25 mg at 01/17/21 2011 .  methylPREDNISolone sodium succinate (SOLU-MEDROL) 125 mg/2 mL injection 50 mg, 50 mg, Intravenous, Q12H, [place] Gins, Jerae Izard, MD, 50 mg at 01/20/21 0846 .  multivitamin with minerals tablet 1 tablet, 1 tablet, Oral, Daily, Kathie Dike, MD, 1 tablet at 01/20/21 0847 .  mycophenolate (CELLCEPT) capsule  500 mg, 500 mg, Oral, BID, [place] Gins, Odyssey Vasbinder, MD, 500 mg at 01/20/21 0847 .  sodium chloride flush (NS) 0.9 % injection 10-40 mL, 10-40 mL, Intracatheter, Q12H, Kathie Dike, MD, 10 mL at 01/20/21 0853 .  sodium chloride flush (NS) 0.9 % injection 10-40 mL, 10-40 mL, Intracatheter, PRN, Kathie Dike, MD .  sulfamethoxazole-trimethoprim (BACTRIM) 400-80 MG per tablet 1 tablet, 1 tablet, Oral, Daily, Ottie Glazier, MD, 1 tablet at 01/20/21 4888  Facility-Administered Medications Ordered in Other Encounters:  .  heparin lock flush 100 unit/mL, 500 Units, Intravenous, Once, Earlie Server, MD    ALLERGIES   Penicillins     REVIEW OF SYSTEMS    Review of Systems:  Gen:  Denies  fever, sweats, chills weigh loss  HEENT: Denies blurred vision, double vision, ear pain, eye pain, hearing loss, nose bleeds, sore throat Cardiac:  No dizziness, chest pain or heaviness, chest tightness,edema Resp:   Denies cough  or sputum porduction, shortness of breath,wheezing, hemoptysis,  Gi: Denies swallowing difficulty, stomach pain, nausea or vomiting, diarrhea, constipation, bowel incontinence Gu:  Denies bladder incontinence, burning urine Ext:   Denies Joint pain, stiffness or swelling Skin: Denies  skin rash, easy bruising or bleeding or hives Endoc:  Denies polyuria, polydipsia , polyphagia or weight change Psych:   Denies depression, insomnia or hallucinations   Other:  All other systems negative   VS: BP 130/81 (BP Location: Right Arm)   Pulse 97   Temp 97.9 F (36.6 C) (Oral)   Resp 18   Ht 6' (1.829 m)   Wt 70.3 kg   SpO2 94%   BMI 21.02 kg/m      PHYSICAL EXAM    GENERAL:NAD, no fevers, chills, no weakness no fatigue HEAD: Normocephalic, atraumatic.  EYES: Pupils equal, round, reactive to light. Extraocular muscles intact. No scleral icterus.  MOUTH: Moist mucosal membrane. Dentition intact. No abscess noted.  EAR, NOSE, THROAT: Clear without exudates. No external lesions.  NECK: Supple. No thyromegaly. No nodules. No JVD.  PULMONARY: Bilateral rhonchi CARDIOVASCULAR: S1 and S2. Regular rate and rhythm. No murmurs, rubs, or gallops. No edema. Pedal pulses 2+ bilaterally.  GASTROINTESTINAL: Soft, nontender, nondistended. No masses. Positive bowel sounds. No hepatosplenomegaly.  MUSCULOSKELETAL: No swelling, clubbing, or edema. Range of motion full in all extremities.  NEUROLOGIC: Cranial nerves II through XII are intact. No gross focal neurological deficits. Sensation intact. Reflexes intact.  SKIN: No ulceration, lesions, rashes, or cyanosis. Skin warm and dry. Turgor intact.  PSYCHIATRIC: Mood, affect within normal limits. The patient is awake, alert and oriented x 3. Insight, judgment intact.       IMAGING    CT Angio Chest PE W/Cm &/Or Wo Cm  Result Date: 01/13/2021 CLINICAL DATA:  Respiratory distress, hypoxia EXAM: CT ANGIOGRAPHY CHEST WITH CONTRAST TECHNIQUE:  Multidetector CT imaging of the chest was performed using the standard protocol during bolus administration of intravenous contrast. Multiplanar CT image reconstructions and MIPs were obtained to evaluate the vascular anatomy. CONTRAST:  70m OMNIPAQUE IOHEXOL 350 MG/ML SOLN COMPARISON:  10/26/2020, 01/13/2021 FINDINGS: Cardiovascular: This is a technically adequate evaluation of the pulmonary vasculature. No filling defects or pulmonary emboli. The heart is unremarkable without pericardial effusion. No evidence of thoracic aortic aneurysm or dissection. Right chest wall port via internal jugular approach tip within the superior vena cava. Mediastinum/Nodes: No pathologic adenopathy within the mediastinum, hila, or axilla. Thyroid, trachea, and esophagus are unremarkable. Lungs/Pleura: There has been interval progression of the multifocal ground-glass airspace disease seen  previously. Changes are most pronounced within the dependent lower lobes. No effusion or pneumothorax. The central airways are patent, with persistent bronchiectasis. Upper Abdomen: No acute abnormality. Musculoskeletal: No acute or destructive bony lesions. Reconstructed images demonstrate no additional findings. Review of the MIP images confirms the above findings. IMPRESSION: 1. No evidence of pulmonary embolus. 2. Progressive multifocal bilateral ground-glass airspace disease, greatest at the lung bases. The differential diagnosis would include progressive postinflammatory scarring and fibrosis, multifocal atypical pneumonia, or hypersensitivity/drug toxicity. 3. Stable bronchiectasis. Electronically Signed   By: Randa Ngo M.D.   On: 01/13/2021 17:13   DG Chest Port 1 View  Result Date: 01/20/2021 CLINICAL DATA:  Pneumonitis EXAM: PORTABLE CHEST 1 VIEW COMPARISON:  01/19/2021 FINDINGS: Shallow inspiration. Patchy infiltrates in the left lung and right lung base, similar to prior study and likely multifocal pneumonia. No pleural  effusions. No pneumothorax. Mediastinal contours appear intact. Heart size is normal. Power port type central venous catheter on the right with tip over the cavoatrial junction region. Surgical clips in the right upper quadrant. IMPRESSION: Shallow inspiration. Patchy infiltrates in both lungs, likely multifocal pneumonia, without change. Electronically Signed   By: Lucienne Capers M.D.   On: 01/20/2021 03:56   DG Chest Port 1 View  Result Date: 01/19/2021 CLINICAL DATA:  Abnormality on previous chest x-ray in a 60 year old male. EXAM: PORTABLE CHEST 1 VIEW COMPARISON:  CT from January 13, 2021, chest x-ray January 18, 2021. FINDINGS: RIGHT-sided Port-A-Cath terminates at the caval to atrial junction. Trachea is midline. Cardiomediastinal contours are stable. Interstitial and airspace opacities throughout the chest worse in the LEFT mid to upper chest, RIGHT upper chest lung bases bilaterally with similar appearance. On limited assessment there is no acute skeletal process. EKG leads project over the chest. IMPRESSION: No interval change in the appearance of the chest since previous imaging with continued decreased lung volumes and patchy bilateral opacities. Electronically Signed   By: Zetta Bills M.D.   On: 01/19/2021 07:59   DG Chest Port 1 View  Result Date: 01/18/2021 CLINICAL DATA:  Respiratory distress EXAM: PORTABLE CHEST 1 VIEW COMPARISON:  01/17/2021 FINDINGS: Cardiac shadow is stable. Right-sided chest wall port is again seen. Lungs are hypoinflated. Patchy airspace opacities again identified in both lungs left greater than right but stable from the prior exam. IMPRESSION: Patchy airspace opacities bilaterally stable from the prior exam. Electronically Signed   By: Inez Catalina M.D.   On: 01/18/2021 17:24   DG Chest Port 1 View  Result Date: 01/17/2021 CLINICAL DATA:  Worsening shortness of breath. EXAM: PORTABLE CHEST 1 VIEW COMPARISON:  01/14/2021 FINDINGS: 0826 hours. Low volume  film. Patchy airspace disease again noted in both lungs, left greater than right. Cardiopericardial silhouette is at upper limits of normal for size. Right Port-A-Cath again noted. Telemetry leads overlie the chest. IMPRESSION: Low volume film with patchy bilateral airspace disease, left greater than right. No substantial change. Electronically Signed   By: Misty Stanley M.D.   On: 01/17/2021 08:53   DG Chest Port 1 View  Result Date: 01/14/2021 CLINICAL DATA:  Shortness of breath.  History of cancer.  Diabetes. EXAM: PORTABLE CHEST 1 VIEW.  Patient is rotated. COMPARISON:  Chest x-ray 01/13/2021, CT chest 01/13/2021 FINDINGS: Accessed right chest wall Port-A-Cath in stable position. The heart and mediastinal contours are within normal limits. Low lung volumes with persistent diffuse patchy airspace opacities. No pleural effusion. No pneumothorax. No acute osseous abnormality. Right upper quadrant surgical clips. IMPRESSION: Low lung  volumes with persistent diffuse patchy airspace opacities. Electronically Signed   By: Iven Finn M.D.   On: 01/14/2021 21:08   DG Chest Port 1 View  Result Date: 01/13/2021 CLINICAL DATA:  Shortness of breath EXAM: PORTABLE CHEST 1 VIEW COMPARISON:  Chest radiograph 07/29/2019 FINDINGS: There is a right chest wall port with the tip terminating in the lower SVC/cavoatrial junction. The cardiomediastinal silhouette is within normal limits. Lung volumes are low with unchanged asymmetric elevation of the right hemidiaphragm. There are increased interstitial markings with diffuse reticular opacities throughout both lungs. There is no focal consolidation. There is no significant pleural effusion. There is no pneumothorax. There is no acute osseous abnormality. There is gaseous distention of the stomach and large bowel in the right upper quadrant. IMPRESSION: Low lung volumes with suspected mild pulmonary interstitial edema. Atypical/viral infection could have a similar  appearance. Electronically Signed   By: Valetta Mole M.D.   On: 01/13/2021 14:24      ASSESSMENT/PLAN   Acute hypoxemic respiratory failure - present on admission  - COVID19 negative   - supplemental O2 during my evaluation 10l/min>>8L -Respiratory viral panel-negative  -serum fungitell- -legionella ab -nasal MRSA PCR-negative -Procalcitonin trend reviewed -strep pneumoniae ur AG -Histoplasma Ur Ag -sputum resp cultures -AFB sputum expectorated specimen -sputum cytology  -Agree with Cefepime empirically  -Reduced Solumedrol to 40 BID - 01/15/21>>50 BID -reviewed pertinent imaging with patient today - ESR and CRP are elevated but improving -PT/OT for d/c planning  -please encourage patient to use incentive spirometer few times each hour while hospitalized.   -continue Cellcept 250 bid >>500 bid  -started lasix 20 bid -01/20/21   Acute blood loss anemia    - noted Hb trending down from 14 to 10 -    -fecal occult blood test    - may be due to GI bleed due to eliquis - monitor for grossly visible bleeding and stable vital sings    Thank you for allowing me to participate in the care of this patient.  Total face to face encounter time for this patient visit was >23mn. >50% of the time was  spent in counseling and coordination of care.   Patient/Family are satisfied with care plan and all questions have been answered.  This document was prepared using Dragon voice recognition software and may include unintentional dictation errors.     FOttie Glazier M.D.  Division of [place]

## 2021-01-20 NOTE — Progress Notes (Signed)
RT note:  Called by RN stating patient in respiratory distress after defecating/exertion.  Found patient tachypneic with SpO2 in mid 80% range despite being placed back on HHFNC at 100%.  Allowed patient the opportunity to recover from incident.  Decision made to leave patient on [place] overnight and attempt to place back on 12L [place] in the morning.  Currently on 45L/70% with SpO2 921%.  Will continue to wean as possible.

## 2021-01-20 NOTE — Progress Notes (Signed)
Received a call from [place], that pt Spo2 was 82%.  Found patient had fallen asleep and taken off his HHFNC.  Reapplied oxygen, and pt SPO2 returned to 93-94% within a minute.

## 2021-01-20 NOTE — Progress Notes (Signed)
Inpatient Diabetes Program Recommendations  AACE/ADA: New Consensus Statement on Inpatient Glycemic Control   Target Ranges:  Prepandial:   less than 140 mg/dL      Peak postprandial:   less than 180 mg/dL (1-2 hours)      Critically ill patients:  140 - 180 mg/dL   Results for DEMARIS, LEAVELL" (MRN [id number]) as of 01/20/2021 10:04  Ref. Range 01/19/2021 07:33 01/19/2021 11:40 01/19/2021 16:49 01/19/2021 20:55 01/20/2021 07:21  Glucose-Capillary Latest Ref Range: 70 - 99 mg/dL 267 (H) 414 (H) 379 (H) 307 (H) 273 (H)   Review of Glycemic Control  Diabetes history: DM2 Outpatient Diabetes medications: Amaryl 2 mg daily, Metformin 500 mg TID Current orders for Inpatient glycemic control: Semglee 20 units daily, Novolog 10 units TID with meals, Novolog 0-20 units TID with meals, Novolog 0-5 units QHS; Solumedrol 60 mg Q12H   Inpatient Diabetes Program Recommendations:     Insulin: If steroids are continued, please consider increasing Semglee to 25 units daily (if agreeable, please also order Semglee 5 units x1 now for total of 25 units today) and meal coverage to Novolog 15 units TID with meals if patient eats at least 50% of meals.   Thanks, Barnie Alderman, RN, MSN, CDE Diabetes Coordinator Inpatient Diabetes Program [phone] (Team Pager from 8am to 5pm)

## 2021-01-21 DIAGNOSIS — I480 Paroxysmal atrial fibrillation: Secondary | ICD-10-CM | POA: Diagnosis not present

## 2021-01-21 DIAGNOSIS — J849 Interstitial pulmonary disease, unspecified: Secondary | ICD-10-CM | POA: Diagnosis not present

## 2021-01-21 DIAGNOSIS — J9621 Acute and chronic respiratory failure with hypoxia: Secondary | ICD-10-CM | POA: Diagnosis not present

## 2021-01-21 LAB — CBC
HCT: 37.7 % — ABNORMAL LOW (ref 39.0–52.0)
Hemoglobin: 12.4 g/dL — ABNORMAL LOW (ref 13.0–17.0)
MCH: 27.9 pg (ref 26.0–34.0)
MCHC: 32.9 g/dL (ref 30.0–36.0)
MCV: 84.7 fL (ref 80.0–100.0)
Platelets: 353 10*3/uL (ref 150–400)
RBC: 4.45 MIL/uL (ref 4.22–5.81)
RDW: 17.2 % — ABNORMAL HIGH (ref 11.5–15.5)
WBC: 11.2 10*3/uL — ABNORMAL HIGH (ref 4.0–10.5)
nRBC: 0 % (ref 0.0–0.2)

## 2021-01-21 LAB — BASIC METABOLIC PANEL
Anion gap: 8 (ref 5–15)
BUN: 29 mg/dL — ABNORMAL HIGH (ref 8–23)
CO2: 31 mmol/L (ref 22–32)
Calcium: 8.9 mg/dL (ref 8.9–10.3)
Chloride: 94 mmol/L — ABNORMAL LOW (ref 98–111)
Creatinine, Ser: 0.55 mg/dL — ABNORMAL LOW (ref 0.61–1.24)
GFR, Estimated: >60 mL/min (ref >60)
Glucose, Bld: 262 mg/dL — ABNORMAL HIGH (ref 70–99)
Potassium: 4.2 mmol/L (ref 3.5–5.1)
Sodium: 133 mmol/L — ABNORMAL LOW (ref 135–145)

## 2021-01-21 LAB — ASPERGILLUS ANTIGEN, BAL/SERUM: Aspergillus Ag, BAL/Serum: 0.02 Index (ref 0.00–0.49)

## 2021-01-21 LAB — GLUCOSE, CAPILLARY
Glucose-Capillary: 319 mg/dL — ABNORMAL HIGH (ref 70–99)
Glucose-Capillary: 330 mg/dL — ABNORMAL HIGH (ref 70–99)
Glucose-Capillary: 197 mg/dL — ABNORMAL HIGH (ref 70–99)
Glucose-Capillary: 154 mg/dL — ABNORMAL HIGH (ref 70–99)

## 2021-01-21 MED ORDER — DOCUSATE SODIUM 100 MG PO CAPS
200.0000 mg | ORAL_CAPSULE | Freq: Two times a day (BID) | ORAL | Status: DC
  Administered 2021-01-24 – 2021-02-19 (×49): 200 mg via ORAL
  Filled 2021-01-21 (×52): qty 2

## 2021-01-21 NOTE — Progress Notes (Signed)
PT Cancellation Note  Patient Details Name: Edward Atkinson MRN: [id number] DOB: 1960/12/13   Cancelled Treatment:    Reason Eval/Treat Not Completed: Other (comment)  Discussed with RN prior to session and OK to proceed with limited activity/stand pivot transfers as pt tolerates.  Offered session to pt. He stated he felt as though resting was best today.   Will check back at a later time/date.   Chesley Noon 01/21/2021, 3:06 PM

## 2021-01-21 NOTE — Progress Notes (Signed)
Pulmonary Medicine          Date: 01/21/2021,   MRN# [id number] Edward Atkinson 10-08-1960     AdmissionWeight: 63.5 kg                 CurrentWeight: 74.1 kg   Referring physician: Dr Roderic Palau   CHIEF COMPLAINT:   Acute on chronic hypoxemic respiratory failure   HISTORY OF PRESENT ILLNESS   60 yo M w/hx of chronic hypoxemia and ILD post COVID19, T2DM, PE on eliquis, Lymphoma s/p Rituxan which is completed.  Came in due to worsening SOB and DOE specifically mild exertional defacation/urination with feelings of presyncope and severe dyspnea which started to get worse post tapering from steroid appx few days prior to onset of symptom progression.  He was placed on solumedrol and reported mild improvement.  He is on 10L /min [place] during my evaluation.  He had CT chest done with PE protocol and noted to have bilateral GGO infiltrates worse compared to June study. PCCM consultation for further evaluation and management.     01/17/21-  patient seems to be slighly clinically worse post reduction of IV steroids yesterday indicative of ongoing ILD exacerbation, thus far infectious workup is negative but inflammatory biomarkers are highly elevated also pointing towards ILD exacerbation.  There is new CXR in process this morning for interval changes. We discussed increasing steroids slightly and giving more time.  I will also initiate steroid sparing immunomodulation with BID cellcept as patient will likely need therapy for prolonged time period.  Compensatory tachycardia noted appreciate cardiology team.  Sugars with peaks and valleys with adjusted steroid therapy.  Patient with muscle wasting and protein calorie malnutrition but eating better this am.  01/18/21- patient is on HFNC at 60%, feels better, has not been able to tolerate BIPAP.  Wife is at bedside.  BP is borderline low MAP 76.  He has negative infectious workup and thus far ILD exacerbation seems to be main differential. He  continues to have peaks and valleys in blood glucose.  We started steroid sparing regimen with cellcept bid 250 mg and will increase dose as we taper off prednisone.  Remains tachyarrtythmic but improved. Appreciate everyone involved.     01/19/21- patient seen and examined at bedside.  Wife present during evaluation.  Ow HFNC weaned to 41%/45L/MIN.  CRP is still up, have increased cellcept and reducing solumedrol.  Patient felt stronger physically and was able to get OOB to chair. Cough is less now.  He is non-labored with breathing no accessory mm use today. He had dark stools with reduced hb, FOBT ordred.  Haptoglobin is elevated. Pathology slide review ordered.   01/20/21- patient is essentially unchanged from yesterday from pulmonary perspective. CXR also unchanged,  bloodwork with reduction in CRP.  Plan to wean O2 and continue OOB with PT/OT,  Continue current steroid dose and Cellcept.   01/21/21- patient is stable on HFNC.  He had small set back with increased O2 req after exerting himself in bathroom.  HFNC from 41/45 to 54/45.   He diuresed very well with lasix 20 bid >2L urine in past 24h.  Tachycardia is improved. He does not feel weaker and infact states he feels slowly improved.    PAST MEDICAL HISTORY   Past Medical History:  Diagnosis Date  . Chronic respiratory failure with hypoxia (HCC)    2L [place] continuous  . COVID-19   . Diabetes mellitus without complication ([place])   . Diverticulitis   .  Follicular lymphoma of intra-abdominal lymph nodes ([place]) 02/15/2019  . ILD (interstitial lung disease) ([place])      SURGICAL HISTORY   Past Surgical History:  Procedure Laterality Date  . CHOLECYSTECTOMY    . COLON RESECTION     DUE TO DIVERTICULITIS  . FLEXIBLE BRONCHOSCOPY N/A 12/02/2019   Procedure: FLEXIBLE BRONCHOSCOPY;  Surgeon: Ottie Glazier, MD;  Location: [facility];  Service: Thoracic;  Laterality: N/A;  . PORTA CATH INSERTION N/A 03/11/2019   Procedure: PORTA CATH INSERTION;   Surgeon: Algernon Huxley, MD;  Location: [place] [facility];  Service: Cardiovascular;  Laterality: N/A;  . PULMONARY THROMBECTOMY N/A 01/29/2019   Procedure: PULMONARY THROMBECTOMY;  Surgeon: Algernon Huxley, MD;  Location: [facility];  Service: Cardiovascular;  Laterality: N/A;     FAMILY HISTORY   Family History  Problem Relation Age of Onset  . Diabetes Brother      SOCIAL HISTORY   Social History   Tobacco Use  . Smoking status: Never  . Smokeless tobacco: Never  Vaping Use  . Vaping Use: Never used  Substance Use Topics  . Alcohol use: Yes    Alcohol/week: 0.0 - 2.0 standard drinks    Comment: occasional  . Drug use: Never     MEDICATIONS    Home Medication:    Current Medication:  Current Facility-Administered Medications:  .  acetaminophen (TYLENOL) tablet 650 mg, 650 mg, Oral, Q6H PRN, 650 mg at 01/18/21 1411 **OR** acetaminophen (TYLENOL) suppository 650 mg, 650 mg, Rectal, Q6H PRN, Howerter, Justin B, DO .  acyclovir (ZOVIRAX) 200 MG capsule 400 mg, 400 mg, Oral, BID, Kathie Dike, MD, 400 mg at 01/21/21 4580 .  apixaban (ELIQUIS) tablet 5 mg, 5 mg, Oral, BID, Beers, Shanon Brow, RPH, 5 mg at 01/21/21 0940 .  Chlorhexidine Gluconate Cloth 2 % PADS 6 each, 6 each, Topical, Daily, Kathie Dike, MD, 6 each at 01/20/21 [phone]  digoxin (LANOXIN) tablet 0.125 mg, 0.125 mg, Oral, Daily, Memon, Jolaine Artist, MD, 0.125 mg at 01/21/21 0938 .  diltiazem (CARDIZEM CD) 24 hr capsule 180 mg, 180 mg, Oral, Daily, Paraschos, Alexander, MD, 180 mg at 01/21/21 0939 .  docusate sodium (COLACE) capsule 200 mg, 200 mg, Oral, BID, Wyvonnia Dusky, MD .  feeding supplement (NEPRO CARB STEADY) liquid 237 mL, 237 mL, Oral, TID BM, Memon, Jehanzeb, MD, 237 mL at 01/21/21 1227 .  furosemide (LASIX) injection 20 mg, 20 mg, Intravenous, BID, Lanney Gins, Malikhi Ogan, MD, 20 mg at 01/21/21 0940 .  guaiFENesin (MUCINEX) 12 hr tablet 1,200 mg, 1,200 mg, Oral, BID, Kathie Dike, MD,  1,200 mg at 01/21/21 0939 .  guaiFENesin-dextromethorphan (ROBITUSSIN DM) 100-10 MG/5ML syrup 5 mL, 5 mL, Oral, Q4H PRN, Kathie Dike, MD, 5 mL at 01/17/21 0648 .  insulin aspart (novoLOG) injection 0-20 Units, 0-20 Units, Subcutaneous, TID WC, Kathie Dike, MD, 15 Units at 01/21/21 1227 .  insulin aspart (novoLOG) injection 0-5 Units, 0-5 Units, Subcutaneous, QHS, Kathie Dike, MD, 4 Units at 01/19/21 2135 .  insulin aspart (novoLOG) injection 15 Units, 15 Units, Subcutaneous, TID WC, Wyvonnia Dusky, MD, 15 Units at 01/21/21 1226 .  insulin glargine-yfgn (SEMGLEE) injection 25 Units, 25 Units, Subcutaneous, Daily, Wyvonnia Dusky, MD, 25 Units at 01/21/21 [phone] .  levalbuterol (XOPENEX) nebulizer solution 1.25 mg, 1.25 mg, Nebulization, Q4H PRN, Howerter, Justin B, DO, 1.25 mg at 01/17/21 2011 .  methylPREDNISolone sodium succinate (SOLU-MEDROL) 125 mg/2 mL injection 50 mg, 50 mg, Intravenous, Q12H, Ottie Glazier, MD,  50 mg at 01/21/21 0939 .  multivitamin with minerals tablet 1 tablet, 1 tablet, Oral, Daily, Kathie Dike, MD, 1 tablet at 01/21/21 0939 .  mycophenolate (CELLCEPT) capsule 500 mg, 500 mg, Oral, BID, Ottie Glazier, MD, 500 mg at 01/21/21 9753 .  sodium chloride flush (NS) 0.9 % injection 10-40 mL, 10-40 mL, Intracatheter, Q12H, Kathie Dike, MD, 10 mL at 01/21/21 0943 .  sodium chloride flush (NS) 0.9 % injection 10-40 mL, 10-40 mL, Intracatheter, PRN, Kathie Dike, MD .  sulfamethoxazole-trimethoprim (BACTRIM) 400-80 MG per tablet 1 tablet, 1 tablet, Oral, Daily, Ottie Glazier, MD, 1 tablet at 01/21/21 0051  Facility-Administered Medications Ordered in Other Encounters:  .  heparin lock flush 100 unit/mL, 500 Units, Intravenous, Once, Earlie Server, MD    ALLERGIES   Penicillins     REVIEW OF SYSTEMS    Review of Systems:  Gen:  Denies  fever, sweats, chills weigh loss  HEENT: Denies blurred vision, double vision, ear pain, eye pain, hearing  loss, nose bleeds, sore throat Cardiac:  No dizziness, chest pain or heaviness, chest tightness,edema Resp:   Denies cough or sputum porduction, shortness of breath,wheezing, hemoptysis,  Gi: Denies swallowing difficulty, stomach pain, nausea or vomiting, diarrhea, constipation, bowel incontinence Gu:  Denies bladder incontinence, burning urine Ext:   Denies Joint pain, stiffness or swelling Skin: Denies  skin rash, easy bruising or bleeding or hives Endoc:  Denies polyuria, polydipsia , polyphagia or weight change Psych:   Denies depression, insomnia or hallucinations   Other:  All other systems negative   VS: BP 116/73 (BP Location: Right Arm)   Pulse 96   Temp 98.4 F (36.9 C) (Oral)   Resp 16   Ht 6' (1.829 m)   Wt 74.1 kg   SpO2 93%   BMI 22.16 kg/m      PHYSICAL EXAM    GENERAL:NAD, no fevers, chills, no weakness no fatigue HEAD: Normocephalic, atraumatic.  EYES: Pupils equal, round, reactive to light. Extraocular muscles intact. No scleral icterus.  MOUTH: Moist mucosal membrane. Dentition intact. No abscess noted.  EAR, NOSE, THROAT: Clear without exudates. No external lesions.  NECK: Supple. No thyromegaly. No nodules. No JVD.  PULMONARY: Bilateral rhonchi CARDIOVASCULAR: S1 and S2. Regular rate and rhythm. No murmurs, rubs, or gallops. No edema. Pedal pulses 2+ bilaterally.  GASTROINTESTINAL: Soft, nontender, nondistended. No masses. Positive bowel sounds. No hepatosplenomegaly.  MUSCULOSKELETAL: No swelling, clubbing, or edema. Range of motion full in all extremities.  NEUROLOGIC: Cranial nerves II through XII are intact. No gross focal neurological deficits. Sensation intact. Reflexes intact.  SKIN: No ulceration, lesions, rashes, or cyanosis. Skin warm and dry. Turgor intact.  PSYCHIATRIC: Mood, affect within normal limits. The patient is awake, alert and oriented x 3. Insight, judgment intact.       IMAGING    CT Angio Chest PE W/Cm &/Or Wo Cm  Result  Date: 01/13/2021 CLINICAL DATA:  Respiratory distress, hypoxia EXAM: CT ANGIOGRAPHY CHEST WITH CONTRAST TECHNIQUE: Multidetector CT imaging of the chest was performed using the standard protocol during bolus administration of intravenous contrast. Multiplanar CT image reconstructions and MIPs were obtained to evaluate the vascular anatomy. CONTRAST:  19m OMNIPAQUE IOHEXOL 350 MG/ML SOLN COMPARISON:  10/26/2020, 01/13/2021 FINDINGS: Cardiovascular: This is a technically adequate evaluation of the pulmonary vasculature. No filling defects or pulmonary emboli. The heart is unremarkable without pericardial effusion. No evidence of thoracic aortic aneurysm or dissection. Right chest wall port via internal jugular approach tip within the superior vena  cava. Mediastinum/Nodes: No pathologic adenopathy within the mediastinum, hila, or axilla. Thyroid, trachea, and esophagus are unremarkable. Lungs/Pleura: There has been interval progression of the multifocal ground-glass airspace disease seen previously. Changes are most pronounced within the dependent lower lobes. No effusion or pneumothorax. The central airways are patent, with persistent bronchiectasis. Upper Abdomen: No acute abnormality. Musculoskeletal: No acute or destructive bony lesions. Reconstructed images demonstrate no additional findings. Review of the MIP images confirms the above findings. IMPRESSION: 1. No evidence of pulmonary embolus. 2. Progressive multifocal bilateral ground-glass airspace disease, greatest at the lung bases. The differential diagnosis would include progressive postinflammatory scarring and fibrosis, multifocal atypical pneumonia, or hypersensitivity/drug toxicity. 3. Stable bronchiectasis. Electronically Signed   By: Randa Ngo M.D.   On: 01/13/2021 17:13   DG Chest Port 1 View  Result Date: 01/20/2021 CLINICAL DATA:  Pneumonitis EXAM: PORTABLE CHEST 1 VIEW COMPARISON:  01/19/2021 FINDINGS: Shallow inspiration. Patchy  infiltrates in the left lung and right lung base, similar to prior study and likely multifocal pneumonia. No pleural effusions. No pneumothorax. Mediastinal contours appear intact. Heart size is normal. Power port type central venous catheter on the right with tip over the cavoatrial junction region. Surgical clips in the right upper quadrant. IMPRESSION: Shallow inspiration. Patchy infiltrates in both lungs, likely multifocal pneumonia, without change. Electronically Signed   By: Lucienne Capers M.D.   On: 01/20/2021 03:56   DG Chest Port 1 View  Result Date: 01/19/2021 CLINICAL DATA:  Abnormality on previous chest x-ray in a 60 year old male. EXAM: PORTABLE CHEST 1 VIEW COMPARISON:  CT from January 13, 2021, chest x-ray January 18, 2021. FINDINGS: RIGHT-sided Port-A-Cath terminates at the caval to atrial junction. Trachea is midline. Cardiomediastinal contours are stable. Interstitial and airspace opacities throughout the chest worse in the LEFT mid to upper chest, RIGHT upper chest lung bases bilaterally with similar appearance. On limited assessment there is no acute skeletal process. EKG leads project over the chest. IMPRESSION: No interval change in the appearance of the chest since previous imaging with continued decreased lung volumes and patchy bilateral opacities. Electronically Signed   By: Zetta Bills M.D.   On: 01/19/2021 07:59   DG Chest Port 1 View  Result Date: 01/18/2021 CLINICAL DATA:  Respiratory distress EXAM: PORTABLE CHEST 1 VIEW COMPARISON:  01/17/2021 FINDINGS: Cardiac shadow is stable. Right-sided chest wall port is again seen. Lungs are hypoinflated. Patchy airspace opacities again identified in both lungs left greater than right but stable from the prior exam. IMPRESSION: Patchy airspace opacities bilaterally stable from the prior exam. Electronically Signed   By: Inez Catalina M.D.   On: 01/18/2021 17:24   DG Chest Port 1 View  Result Date: 01/17/2021 CLINICAL DATA:   Worsening shortness of breath. EXAM: PORTABLE CHEST 1 VIEW COMPARISON:  01/14/2021 FINDINGS: 0826 hours. Low volume film. Patchy airspace disease again noted in both lungs, left greater than right. Cardiopericardial silhouette is at upper limits of normal for size. Right Port-A-Cath again noted. Telemetry leads overlie the chest. IMPRESSION: Low volume film with patchy bilateral airspace disease, left greater than right. No substantial change. Electronically Signed   By: Misty Stanley M.D.   On: 01/17/2021 08:53   DG Chest Port 1 View  Result Date: 01/14/2021 CLINICAL DATA:  Shortness of breath.  History of cancer.  Diabetes. EXAM: PORTABLE CHEST 1 VIEW.  Patient is rotated. COMPARISON:  Chest x-ray 01/13/2021, CT chest 01/13/2021 FINDINGS: Accessed right chest wall Port-A-Cath in stable position. The heart and mediastinal contours  are within normal limits. Low lung volumes with persistent diffuse patchy airspace opacities. No pleural effusion. No pneumothorax. No acute osseous abnormality. Right upper quadrant surgical clips. IMPRESSION: Low lung volumes with persistent diffuse patchy airspace opacities. Electronically Signed   By: Iven Finn M.D.   On: 01/14/2021 21:08   DG Chest Port 1 View  Result Date: 01/13/2021 CLINICAL DATA:  Shortness of breath EXAM: PORTABLE CHEST 1 VIEW COMPARISON:  Chest radiograph 07/29/2019 FINDINGS: There is a right chest wall port with the tip terminating in the lower SVC/cavoatrial junction. The cardiomediastinal silhouette is within normal limits. Lung volumes are low with unchanged asymmetric elevation of the right hemidiaphragm. There are increased interstitial markings with diffuse reticular opacities throughout both lungs. There is no focal consolidation. There is no significant pleural effusion. There is no pneumothorax. There is no acute osseous abnormality. There is gaseous distention of the stomach and large bowel in the right upper quadrant. IMPRESSION: Low  lung volumes with suspected mild pulmonary interstitial edema. Atypical/viral infection could have a similar appearance. Electronically Signed   By: Valetta Mole M.D.   On: 01/13/2021 14:24      ASSESSMENT/PLAN   Acute hypoxemic respiratory failure - present on admission  - COVID19 negative   - supplemental O2 during my evaluation 10l/min>>8L -Respiratory viral panel-negative  -serum fungitell- -legionella ab -nasal MRSA PCR-negative -Procalcitonin trend reviewed -strep pneumoniae ur AG -Histoplasma Ur Ag -sputum resp cultures -AFB sputum expectorated specimen -sputum cytology  -Agree with Cefepime empirically  -Reduced Solumedrol to 40 BID - 01/15/21>>50 BID -reviewed pertinent imaging with patient today - ESR and CRP are elevated but improving -PT/OT for d/c planning  -please encourage patient to use incentive spirometer few times each hour while hospitalized.   -continue Cellcept 250 bid >>500 bid  -started lasix 20 bid -01/20/21 working well.    Acute blood loss anemia    - noted Hb trending down from 14 to 10 -    -fecal occult blood test    - may be due to GI bleed due to eliquis - monitor for grossly visible bleeding and stable vital sings    Thank you for allowing me to participate in the care of this patient.   Patient/Family are satisfied with care plan and all questions have been answered.   This document was prepared using Dragon voice recognition software and may include unintentional dictation errors.     Ottie Glazier, M.D.  Div[facility] of [place]

## 2021-01-21 NOTE — Progress Notes (Addendum)
PROGRESS NOTE    Edward Atkinson  YEB:[id number] DOB: 12-04-1960 DOA: 01/13/2021 PCP: Rusty Aus, MD    Assessment & Plan:   Principal Problem:   Acute on chronic respiratory failure with hypoxia [facility]) Active Problems:   Type 2 diabetes mellitus without complication, without long-term current use of insulin (HCC)   SOB (shortness of breath)   Interstitial lung disease (HCC)   Severe sepsis ([place])   CAP (community acquired pneumonia)   Elevated troponin   Protein calorie malnutrition (HCC)   Protein-calorie malnutrition, severe   Acute on chronic hypoxic respiratory failure: continue on supplemental oxygen and wean back to baseline as tolerated. On 2L [place] at home and still on HFNC currently. CTA chest negative for PE, &  possible progressive ILD vs pneumonia. Completed abx course. Continue on IV steroids, cellcept and bronchodilators. Continue on bactrim for PCP prophylaxis & continue on acyclovir for HSV prophylaxis. Influenza & COVID19 were both neg   Sepsis : met criteria w/ leukocytosis, tachycardia and tachypnea & likely pneumonia. Continue on IV azithromycin. Resolved    Likely PAF: continue on cardizem, digoxin & eliquis as per cardio. Cardio signed off and pt will need f/u outpatient w/ Dr. Saralyn Pilar in 1-2 weeks after discharge    Normocytic anemia: H&H are labile.    Mild elevation of troponin: likely secondary to demand ischemia    DM2: well controlled. Continue glargine, SSI w/ accuchecks    Hx of pulmonary embolism: continue on eliquis    Hx of follicular cell lymphoma: management per onco an outpatient    DVT prophylaxis: eliquis  Code Status: full  Family Communication: discussed pt's care w/ pt's family at bedside and answered their questions  Disposition Plan: likely d/c back home   Level of care: Progressive Cardiac  Status is: Inpatient  Remains inpatient appropriate because:IV treatments appropriate due to intensity of illness or inability to take  PO and Inpatient level of care appropriate due to severity of illness  Dispo: The patient is from: Home              Anticipated d/c is to: Home              Patient currently is not medically stable to d/c.   Difficult to place patient : unclear   Consultants:  Cardio Pulmon   Procedures:   Antimicrobials:   Subjective: Pt c/o shortness of breath especially w/ exertion  Objective: Vitals:   01/20/21 2100 01/20/21 2345 01/21/21 0417 01/21/21 0517  BP: 120/68 111/79 99/75   Pulse: 63 (!) 102 85   Resp: _0 Temp: 98.6 F (37 C) 97.9 F (36.6 C) 98 F (36.7 C)   TempSrc: Oral Oral Oral   SpO2: 99% 93% 100%   Weight:    74.1 kg  Height:        Intake/Output Summary (Last 24 hours) at 01/21/2021 0749 Last data filed at 01/21/2021 8372 Gross per 24 hour  Intake 1600.14 ml  Output 1600 ml  Net 0.14 ml   Filed Weights   01/20/21 0008 01/20/21 0500 01/21/21 0517  Weight: 70.3 kg 70.3 kg 74.1 kg    Examination:  General exam: Appears comfortable  Respiratory system: decreased breath sounds b/l.  Cardiovascular system: S1/S2+. No rubs or clicks  Gastrointestinal system: Abd is soft, NT, ND & normal bowel sounds  Central nervous system: Alert and oriented. Moves all extremities  Psychiatry: Judgement and insight appear normal. Appropriate mood and affect  Data Reviewed: I have personally reviewed following labs and imaging studies  CBC: Recent Labs  Lab 01/16/21 0610 01/18/21 0424 01/19/21 0443 01/20/21 0437 01/21/21 0556  WBC 10.3 12.4* 11.2* 11.3* 11.2*  HGB 10.3* 11.2* 11.2* 10.9* 12.4*  HCT 32.8* 35.2* 34.6* 33.7* 37.7*  MCV 84.1 85.0 84.4 85.5 84.7  PLT 363 333 303 346 353   Basic Metabolic Panel: Recent Labs  Lab 01/16/21 0610 01/18/21 0424 01/19/21 0443 01/20/21 0437 01/21/21 0556  NA 137 137 136 137 133*  K 4.2 4.0 4.1 4.3 4.2  CL 101 97* 98 97* 94*  CO2 28 33* 33* 34* 31  GLUCOSE 253* 94 263* 250* 262*  BUN 19 23 24* 25* 29*   CREATININE 0.55* 0.58* 0.41* 0.53* 0.55*  CALCIUM 8.3* 8.9 8.6* 8.5* 8.9  MG 2.1  --   --   --   --   PHOS 3.3  --   --  2.7  --    GFR: Estimated Creatinine Clearance: 100.3 mL/min (A) (by C-G formula based on SCr of 0.55 mg/dL (L)). Liver Function Tests: Recent Labs  Lab 01/20/21 0437  ALBUMIN 2.3*   No results for input(s): LIPASE, AMYLASE in the last 168 hours. No results for input(s): AMMONIA in the last 168 hours. Coagulation Profile: No results for input(s): INR, PROTIME in the last 168 hours. Cardiac Enzymes: No results for input(s): CKTOTAL, CKMB, CKMBINDEX, TROPONINI in the last 168 hours. BNP (last 3 results) No results for input(s): PROBNP in the last 8760 hours. HbA1C: No results for input(s): HGBA1C in the last 72 hours. CBG: Recent Labs  Lab 01/20/21 0721 01/20/21 1139 01/20/21 1551 01/20/21 2129 01/20/21 2250  GLUCAP 273* 387* 318* 57* 190*   Lipid Profile: No results for input(s): CHOL, HDL, LDLCALC, TRIG, CHOLHDL, LDLDIRECT in the last 72 hours. Thyroid Function Tests: No results for input(s): TSH, T4TOTAL, FREET4, T3FREE, THYROIDAB in the last 72 hours. Anemia Panel: No results for input(s): VITAMINB12, FOLATE, FERRITIN, TIBC, IRON, RETICCTPCT in the last 72 hours. Sepsis Labs: Recent Labs  Lab 01/15/21 0440  PROCALCITON 0.41    Recent Results (from the past 240 hour(s))  Resp Panel by RT-PCR (Flu A&B, Covid) Nasopharyngeal Swab     Status: None   Collection Time: 01/13/21  1:33 PM   Specimen: Nasopharyngeal Swab; Nasopharyngeal(NP) swabs in vial transport medium  Result Value Ref Range Status   SARS Coronavirus 2 by RT PCR NEGATIVE NEGATIVE Final    Comment: (NOTE) SARS-CoV-2 target nucleic acids are NOT DETECTED.  The SARS-CoV-2 RNA is generally detectable in upper respiratory specimens during the acute phase of infection. The lowest concentration of SARS-CoV-2 viral copies this assay can detect is 138 copies/mL. A negative result does  not preclude SARS-Cov-2 infection and should not be used as the sole basis for treatment or other patient management decisions. A negative result may occur with  improper specimen collection/handling, submission of specimen other than nasopharyngeal swab, presence of viral mutation(s) within the areas targeted by this assay, and inadequate number of viral copies(<138 copies/mL). A negative result must be combined with clinical observations, patient history, and epidemiological information. The expected result is Negative.  Fact Sheet for Patients:  https://www.fda.gov/media/152166/download  Fact Sheet for Healthcare Providers:  https://www.fda.gov/media/152162/download  This test is no t yet approved or cleared by the [place] FDA and  has been authorized for detection and/or diagnosis of SARS-CoV-2 by FDA under an Emergency Use Authorization (EUA). This EUA will remain  in effect (meaning   this test can be used) for the duration of the COVID-19 declaration under Section 564(b)(1) of the Act, 21 U.S.C.section 360bbb-3(b)(1), unless the authorization is terminated  or revoked sooner.       Influenza A by PCR NEGATIVE NEGATIVE Final   Influenza B by PCR NEGATIVE NEGATIVE Final    Comment: (NOTE) The Xpert Xpress SARS-CoV-2/FLU/RSV plus assay is intended as an aid in the diagnosis of influenza from Nasopharyngeal swab specimens and should not be used as a sole basis for treatment. Nasal washings and aspirates are unacceptable for Xpert Xpress SARS-CoV-2/FLU/RSV testing.  Fact Sheet for Patients: https://www.fda.gov/media/152166/download  Fact Sheet for Healthcare Providers: https://www.fda.gov/media/152162/download  This test is not yet approved or cleared by the [place] FDA and has been authorized for detection and/or diagnosis of SARS-CoV-2 by FDA under an Emergency Use Authorization (EUA). This EUA will remain in effect (meaning this test can be used) for the  duration of the COVID-19 declaration under Section 564(b)(1) of the Act, 21 U.S.C. section 360bbb-3(b)(1), unless the authorization is terminated or revoked.  Performed at [facility], [address] [place], [place] [address]   Blood culture (routine x 2)     Status: None   Collection Time: 01/13/21  6:20 PM   Specimen: BLOOD  Result Value Ref Range Status   Specimen Description BLOOD RIGHT ANTECUBITAL  Final   Special Requests   Final    BOTTLES DRAWN AEROBIC AND ANAEROBIC Blood Culture adequate volume   Culture   Final    NO GROWTH 5 DAYS Performed at [facility], [address] [place], [place] [address]    Report Status 01/18/2021 FINAL  Final  Blood culture (routine x 2)     Status: None   Collection Time: 01/13/21  6:20 PM   Specimen: BLOOD  Result Value Ref Range Status   Specimen Description BLOOD LEFT ANTECUBITAL  Final   Special Requests   Final    BOTTLES DRAWN AEROBIC AND ANAEROBIC Blood Culture adequate volume   Culture   Final    NO GROWTH 5 DAYS Performed at [facility], [address] [place], [place] [address]    Report Status 01/18/2021 FINAL  Final  Respiratory (~20 pathogens) panel by PCR     Status: None   Collection Time: 01/15/21  2:01 PM   Specimen: Nasopharyngeal Swab; Respiratory  Result Value Ref Range Status   Adenovirus NOT DETECTED NOT DETECTED Final   Coronavirus 229E NOT DETECTED NOT DETECTED Final    Comment: (NOTE) The Coronavirus on the Respiratory Panel, DOES NOT test for the novel  Coronavirus (2019 nCoV)    Coronavirus HKU1 NOT DETECTED NOT DETECTED Final   Coronavirus NL63 NOT DETECTED NOT DETECTED Final   Coronavirus OC43 NOT DETECTED NOT DETECTED Final   Metapneumovirus NOT DETECTED NOT DETECTED Final   Rhinovirus / Enterovirus NOT DETECTED NOT DETECTED Final   Influenza A NOT DETECTED NOT DETECTED Final   Influenza B NOT DETECTED NOT DETECTED Final   Parainfluenza Virus 1 NOT DETECTED NOT  DETECTED Final   Parainfluenza Virus 2 NOT DETECTED NOT DETECTED Final   Parainfluenza Virus 3 NOT DETECTED NOT DETECTED Final   Parainfluenza Virus 4 NOT DETECTED NOT DETECTED Final   Respiratory Syncytial Virus NOT DETECTED NOT DETECTED Final   Bordetella pertussis NOT DETECTED NOT DETECTED Final   Bordetella Parapertussis NOT DETECTED NOT DETECTED Final   Chlamydophila pneumoniae NOT DETECTED NOT DETECTED Final   Mycoplasma pneumoniae NOT DETECTED NOT DETECTED   Final    Comment: Performed at [facility], [address] [place], [place] [address]  MRSA Next Gen by PCR, Nasal     Status: None   Collection Time: 01/15/21  2:02 PM   Specimen: Nasal Mucosa; Nasal Swab  Result Value Ref Range Status   MRSA by PCR Next Gen NOT DETECTED NOT DETECTED Final    Comment: (NOTE) The GeneXpert MRSA Assay (FDA approved for NASAL specimens only), is one component of a comprehensive MRSA colonization surveillance program. It is not intended to diagnose MRSA infection nor to guide or monitor treatment for MRSA infections. Test performance is not FDA approved in patients less than 2 years old. Performed at [facility], [address] [place], [place] [address]   Aspergillus Ag, BAL/Serum     Status: None   Collection Time: 01/19/21  4:43 AM  Result Value Ref Range Status   Aspergillus Ag, BAL/Serum 0.02 0.00 - 0.49 Index Final    Comment: (NOTE) Performed At: [facility] [address] [place], [place] [id number] Nagendra Sanjai MD Ph:[phone]          Radiology Studies: DG Chest Port 1 View  Result Date: 01/20/2021 CLINICAL DATA:  Pneumonitis EXAM: PORTABLE CHEST 1 VIEW COMPARISON:  01/19/2021 FINDINGS: Shallow inspiration. Patchy infiltrates in the left lung and right lung base, similar to prior study and likely multifocal pneumonia. No pleural effusions. No pneumothorax. Mediastinal contours appear intact. Heart size is normal. Power port type central venous  catheter on the right with tip over the cavoatrial junction region. Surgical clips in the right upper quadrant. IMPRESSION: Shallow inspiration. Patchy infiltrates in both lungs, likely multifocal pneumonia, without change. Electronically Signed   By: William  Stevens M.D.   On: 01/20/2021 03:56        Scheduled Meds:  acyclovir  400 mg Oral BID   apixaban  5 mg Oral BID   Chlorhexidine Gluconate Cloth  6 each Topical Daily   digoxin  0.125 mg Oral Daily   diltiazem  180 mg Oral Daily   feeding supplement (NEPRO CARB STEADY)  237 mL Oral TID BM   furosemide  20 mg Intravenous BID   guaiFENesin  1,200 mg Oral BID   insulin aspart  0-20 Units Subcutaneous TID WC   insulin aspart  0-5 Units Subcutaneous QHS   insulin aspart  15 Units Subcutaneous TID WC   insulin glargine-yfgn  25 Units Subcutaneous Daily   methylPREDNISolone (SOLU-MEDROL) injection  50 mg Intravenous Q12H   multivitamin with minerals  1 tablet Oral Daily   mycophenolate  500 mg Oral BID   sodium chloride flush  10-40 mL Intracatheter Q12H   sulfamethoxazole-trimethoprim  1 tablet Oral Daily   Continuous Infusions:     LOS: 8 days    Time spent: 34 mins     Jamiese M Williams, MD [facility] Pager [phone]  If 7PM-7AM, please contact night-coverage  01/21/2021, 7:49 AM   

## 2021-01-22 ENCOUNTER — Inpatient Hospital Stay: Payer: BC Managed Care – PPO

## 2021-01-22 DIAGNOSIS — J9621 Acute and chronic respiratory failure with hypoxia: Secondary | ICD-10-CM | POA: Diagnosis not present

## 2021-01-22 DIAGNOSIS — J849 Interstitial pulmonary disease, unspecified: Secondary | ICD-10-CM | POA: Diagnosis not present

## 2021-01-22 DIAGNOSIS — I480 Paroxysmal atrial fibrillation: Secondary | ICD-10-CM | POA: Diagnosis not present

## 2021-01-22 LAB — CBC
HCT: 41.1 % (ref 39.0–52.0)
Hemoglobin: 13.1 g/dL (ref 13.0–17.0)
MCH: 26.5 pg (ref 26.0–34.0)
MCHC: 31.9 g/dL (ref 30.0–36.0)
MCV: 83.2 fL (ref 80.0–100.0)
Platelets: 398 10*3/uL (ref 150–400)
RBC: 4.94 MIL/uL (ref 4.22–5.81)
RDW: 17.3 % — ABNORMAL HIGH (ref 11.5–15.5)
WBC: 14.7 10*3/uL — ABNORMAL HIGH (ref 4.0–10.5)
nRBC: 0 % (ref 0.0–0.2)

## 2021-01-22 LAB — BASIC METABOLIC PANEL
Anion gap: 10 (ref 5–15)
BUN: 32 mg/dL — ABNORMAL HIGH (ref 8–23)
CO2: 33 mmol/L — ABNORMAL HIGH (ref 22–32)
Calcium: 8.4 mg/dL — ABNORMAL LOW (ref 8.9–10.3)
Chloride: 92 mmol/L — ABNORMAL LOW (ref 98–111)
Creatinine, Ser: 0.56 mg/dL — ABNORMAL LOW (ref 0.61–1.24)
GFR, Estimated: >60 mL/min (ref >60)
Glucose, Bld: 300 mg/dL — ABNORMAL HIGH (ref 70–99)
Potassium: 4.2 mmol/L (ref 3.5–5.1)
Sodium: 135 mmol/L (ref 135–145)

## 2021-01-22 LAB — C-REACTIVE PROTEIN: CRP: 0.7 mg/dL (ref <1.0)

## 2021-01-22 LAB — GLUCOSE, CAPILLARY
Glucose-Capillary: 302 mg/dL — ABNORMAL HIGH (ref 70–99)
Glucose-Capillary: 339 mg/dL — ABNORMAL HIGH (ref 70–99)
Glucose-Capillary: 261 mg/dL — ABNORMAL HIGH (ref 70–99)
Glucose-Capillary: 178 mg/dL — ABNORMAL HIGH (ref 70–99)

## 2021-01-22 LAB — SEDIMENTATION RATE: Sed Rate: 13 mm/hr (ref 0–20)

## 2021-01-22 NOTE — Progress Notes (Signed)
Pulmonary Medicine          Date: 01/22/2021,   MRN# [id number] Edward Atkinson 23-Apr-1961     AdmissionWeight: 63.5 kg                 CurrentWeight: 71.4 kg   Referring physician: Dr Roderic Palau   CHIEF COMPLAINT:   Acute on chronic hypoxemic respiratory failure   HISTORY OF PRESENT ILLNESS   60 yo M w/hx of chronic hypoxemia and ILD post COVID19, T2DM, PE on eliquis, Lymphoma s/p Rituxan which is completed.  Came in due to worsening SOB and DOE specifically mild exertional defacation/urination with feelings of presyncope and severe dyspnea which started to get worse post tapering from steroid appx few days prior to onset of symptom progression.  He was placed on solumedrol and reported mild improvement.  He is on 10L /min [place] during my evaluation.  He had CT chest done with PE protocol and noted to have bilateral GGO infiltrates worse compared to June study. PCCM consultation for further evaluation and management.     01/17/21-  patient seems to be slighly clinically worse post reduction of IV steroids yesterday indicative of ongoing ILD exacerbation, thus far infectious workup is negative but inflammatory biomarkers are highly elevated also pointing towards ILD exacerbation.  There is new CXR in process this morning for interval changes. We discussed increasing steroids slightly and giving more time.  I will also initiate steroid sparing immunomodulation with BID cellcept as patient will likely need therapy for prolonged time period.  Compensatory tachycardia noted appreciate cardiology team.  Sugars with peaks and valleys with adjusted steroid therapy.  Patient with muscle wasting and protein calorie malnutrition but eating better this am.  01/18/21- patient is on HFNC at 60%, feels better, has not been able to tolerate BIPAP.  Wife is at bedside.  BP is borderline low MAP 76.  He has negative infectious workup and thus far ILD exacerbation seems to be main differential. He  continues to have peaks and valleys in blood glucose.  We started steroid sparing regimen with cellcept bid 250 mg and will increase dose as we taper off prednisone.  Remains tachyarrtythmic but improved. Appreciate everyone involved.     01/19/21- patient seen and examined at bedside.  Wife present during evaluation.  Ow HFNC weaned to 41%/45L/MIN.  CRP is still up, have increased cellcept and reducing solumedrol.  Patient felt stronger physically and was able to get OOB to chair. Cough is less now.  He is non-labored with breathing no accessory mm use today. He had dark stools with reduced hb, FOBT ordred.  Haptoglobin is elevated. Pathology slide review ordered.   01/20/21- patient is essentially unchanged from yesterday from pulmonary perspective. CXR also unchanged,  bloodwork with reduction in CRP.  Plan to wean O2 and continue OOB with PT/OT,  Continue current steroid dose and Cellcept.   01/21/21- patient is stable on HFNC.  He had small set back with increased O2 req after exerting himself in bathroom.  HFNC from 41/45 to 54/45.   He diuresed very well with lasix 20 bid >2L urine in past 24h.  Tachycardia is improved. He does not feel weaker and infact states he feels slowly improved.   01/22/21- I spoke to patient regarding goals of care and recommend DNR.  He is agreeable but I asked him to discuss with wife and family out of respect and we will revisit tommorow.  He states he is feeling better  and CRP is trending down but he remains on HFNC 60/40.  He was able to get OOB today and walked to bathroom but experienced mild desaturation with quick recovery "not much issues".      PAST MEDICAL HISTORY   Past Medical History:  Diagnosis Date  . Chronic respiratory failure with hypoxia (HCC)    2L [place] continuous  . COVID-19   . Diabetes mellitus without complication ([place])   . Diverticulitis   . Follicular lymphoma of intra-abdominal lymph nodes ([place]) 02/15/2019  . ILD (interstitial lung  disease) ([place])      SURGICAL HISTORY   Past Surgical History:  Procedure Laterality Date  . CHOLECYSTECTOMY    . COLON RESECTION     DUE TO DIVERTICULITIS  . FLEXIBLE BRONCHOSCOPY N/A 12/02/2019   Procedure: FLEXIBLE BRONCHOSCOPY;  Surgeon: Ottie Glazier, MD;  Location: [facility];  Service: Thoracic;  Laterality: N/A;  . PORTA CATH INSERTION N/A 03/11/2019   Procedure: PORTA CATH INSERTION;  Surgeon: Algernon Huxley, MD;  Location: [place] [facility];  Service: Cardiovascular;  Laterality: N/A;  . PULMONARY THROMBECTOMY N/A 01/29/2019   Procedure: PULMONARY THROMBECTOMY;  Surgeon: Algernon Huxley, MD;  Location: [place] [facility];  Service: Cardiovascular;  Laterality: N/A;     FAMILY HISTORY   Family History  Problem Relation Age of Onset  . Diabetes Brother      SOCIAL HISTORY   Social History   Tobacco Use  . Smoking status: Never  . Smokeless tobacco: Never  Vaping Use  . Vaping Use: Never used  Substance Use Topics  . Alcohol use: Yes    Alcohol/week: 0.0 - 2.0 standard drinks    Comment: occasional  . Drug use: Never     MEDICATIONS    Home Medication:    Current Medication:  Current Facility-Administered Medications:  .  acetaminophen (TYLENOL) tablet 650 mg, 650 mg, Oral, Q6H PRN, 650 mg at 01/18/21 1411 **OR** acetaminophen (TYLENOL) suppository 650 mg, 650 mg, Rectal, Q6H PRN, Howerter, Justin B, DO .  acyclovir (ZOVIRAX) 200 MG capsule 400 mg, 400 mg, Oral, BID, Kathie Dike, MD, 400 mg at 01/22/21 0859 .  apixaban (ELIQUIS) tablet 5 mg, 5 mg, Oral, BID, Beers, Shanon Brow, RPH, 5 mg at 01/22/21 0857 .  Chlorhexidine Gluconate Cloth 2 % PADS 6 each, 6 each, Topical, Daily, Kathie Dike, MD, 6 each at 01/22/21 0859 .  digoxin (LANOXIN) tablet 0.125 mg, 0.125 mg, Oral, Daily, Memon, Jehanzeb, MD, 0.125 mg at 01/22/21 0857 .  diltiazem (CARDIZEM CD) 24 hr capsule 180 mg, 180 mg, Oral, Daily, Paraschos, Alexander, MD, 180 mg at 01/22/21 0857 .   docusate sodium (COLACE) capsule 200 mg, 200 mg, Oral, BID, Wyvonnia Dusky, MD .  feeding supplement (NEPRO CARB STEADY) liquid 237 mL, 237 mL, Oral, TID BM, Memon, Jehanzeb, MD, Last Rate: 0 mL/hr at 01/21/21 2130, 237 mL at 01/22/21 1351 .  furosemide (LASIX) injection 20 mg, 20 mg, Intravenous, BID, Ottie Glazier, MD, 20 mg at 01/22/21 0859 .  guaiFENesin (MUCINEX) 12 hr tablet 1,200 mg, 1,200 mg, Oral, BID, Kathie Dike, MD, 1,200 mg at 01/22/21 0857 .  guaiFENesin-dextromethorphan (ROBITUSSIN DM) 100-10 MG/5ML syrup 5 mL, 5 mL, Oral, Q4H PRN, Kathie Dike, MD, 5 mL at 01/17/21 0648 .  insulin aspart (novoLOG) injection 0-20 Units, 0-20 Units, Subcutaneous, TID WC, Kathie Dike, MD, 15 Units at 01/22/21 1350 .  insulin aspart (novoLOG) injection 0-5 Units, 0-5 Units, Subcutaneous, QHS, Kathie Dike, MD, 4  Units at 01/19/21 2135 .  insulin aspart (novoLOG) injection 15 Units, 15 Units, Subcutaneous, TID WC, Wyvonnia Dusky, MD, 15 Units at 01/22/21 1350 .  insulin glargine-yfgn (SEMGLEE) injection 25 Units, 25 Units, Subcutaneous, Daily, Wyvonnia Dusky, MD, 25 Units at 01/22/21 [phone] .  levalbuterol (XOPENEX) nebulizer solution 1.25 mg, 1.25 mg, Nebulization, Q4H PRN, Howerter, Justin B, DO, 1.25 mg at 01/17/21 2011 .  methylPREDNISolone sodium succinate (SOLU-MEDROL) 125 mg/2 mL injection 50 mg, 50 mg, Intravenous, Q12H, Lanney Gins, Forbes Loll, MD, 50 mg at 01/22/21 0859 .  multivitamin with minerals tablet 1 tablet, 1 tablet, Oral, Daily, Kathie Dike, MD, 1 tablet at 01/22/21 0857 .  mycophenolate (CELLCEPT) capsule 500 mg, 500 mg, Oral, BID, Lanney Gins, Nephtali Docken, MD, 500 mg at 01/22/21 0859 .  sodium chloride flush (NS) 0.9 % injection 10-40 mL, 10-40 mL, Intracatheter, Q12H, Memon, Jehanzeb, MD, 10 mL at 01/22/21 0900 .  sodium chloride flush (NS) 0.9 % injection 10-40 mL, 10-40 mL, Intracatheter, PRN, Kathie Dike, MD .  sulfamethoxazole-trimethoprim (BACTRIM) 400-80 MG  per tablet 1 tablet, 1 tablet, Oral, Daily, Ottie Glazier, MD, 1 tablet at 01/22/21 2355  Facility-Administered Medications Ordered in Other Encounters:  .  heparin lock flush 100 unit/mL, 500 Units, Intravenous, Once, Earlie Server, MD    ALLERGIES   Penicillins     REVIEW OF SYSTEMS    Review of Systems:  Gen:  Denies  fever, sweats, chills weigh loss  HEENT: Denies blurred vision, double vision, ear pain, eye pain, hearing loss, nose bleeds, sore throat Cardiac:  No dizziness, chest pain or heaviness, chest tightness,edema Resp:   Denies cough or sputum porduction, shortness of breath,wheezing, hemoptysis,  Gi: Denies swallowing difficulty, stomach pain, nausea or vomiting, diarrhea, constipation, bowel incontinence Gu:  Denies bladder incontinence, burning urine Ext:   Denies Joint pain, stiffness or swelling Skin: Denies  skin rash, easy bruising or bleeding or hives Endoc:  Denies polyuria, polydipsia , polyphagia or weight change Psych:   Denies depression, insomnia or hallucinations   Other:  All other systems negative   VS: BP 127/67 (BP Location: Right Arm)   Pulse (!) 106   Temp 98.2 F (36.8 C) (Oral)   Resp 18   Ht 6' (1.829 m)   Wt 71.4 kg   SpO2 91%   BMI 21.35 kg/m      PHYSICAL EXAM    GENERAL:NAD, no fevers, chills, no weakness no fatigue HEAD: Normocephalic, atraumatic.  EYES: Pupils equal, round, reactive to light. Extraocular muscles intact. No scleral icterus.  MOUTH: Moist mucosal membrane. Dentition intact. No abscess noted.  EAR, NOSE, THROAT: Clear without exudates. No external lesions.  NECK: Supple. No thyromegaly. No nodules. No JVD.  PULMONARY: Bilateral rhonchi CARDIOVASCULAR: S1 and S2. Regular rate and rhythm. No murmurs, rubs, or gallops. No edema. Pedal pulses 2+ bilaterally.  GASTROINTESTINAL: Soft, nontender, nondistended. No masses. Positive bowel sounds. No hepatosplenomegaly.  MUSCULOSKELETAL: No swelling, clubbing, or  edema. Range of motion full in all extremities.  NEUROLOGIC: Cranial nerves II through XII are intact. No gross focal neurological deficits. Sensation intact. Reflexes intact.  SKIN: No ulceration, lesions, rashes, or cyanosis. Skin warm and dry. Turgor intact.  PSYCHIATRIC: Mood, affect within normal limits. The patient is awake, alert and oriented x 3. Insight, judgment intact.       IMAGING    CT Angio Chest PE W/Cm &/Or Wo Cm  Result Date: 01/13/2021 CLINICAL DATA:  Respiratory distress, hypoxia EXAM: CT ANGIOGRAPHY CHEST WITH CONTRAST TECHNIQUE:  Multidetector CT imaging of the chest was performed using the standard protocol during bolus administration of intravenous contrast. Multiplanar CT image reconstructions and MIPs were obtained to evaluate the vascular anatomy. CONTRAST:  60m OMNIPAQUE IOHEXOL 350 MG/ML SOLN COMPARISON:  10/26/2020, 01/13/2021 FINDINGS: Cardiovascular: This is a technically adequate evaluation of the pulmonary vasculature. No filling defects or pulmonary emboli. The heart is unremarkable without pericardial effusion. No evidence of thoracic aortic aneurysm or dissection. Right chest wall port via internal jugular approach tip within the superior vena cava. Mediastinum/Nodes: No pathologic adenopathy within the mediastinum, hila, or axilla. Thyroid, trachea, and esophagus are unremarkable. Lungs/Pleura: There has been interval progression of the multifocal ground-glass airspace disease seen previously. Changes are most pronounced within the dependent lower lobes. No effusion or pneumothorax. The central airways are patent, with persistent bronchiectasis. Upper Abdomen: No acute abnormality. Musculoskeletal: No acute or destructive bony lesions. Reconstructed images demonstrate no additional findings. Review of the MIP images confirms the above findings. IMPRESSION: 1. No evidence of pulmonary embolus. 2. Progressive multifocal bilateral ground-glass airspace disease,  greatest at the lung bases. The differential diagnosis would include progressive postinflammatory scarring and fibrosis, multifocal atypical pneumonia, or hypersensitivity/drug toxicity. 3. Stable bronchiectasis. Electronically Signed   By: MRanda NgoM.D.   On: 01/13/2021 17:13   DG Chest Port 1 View  Result Date: 01/20/2021 CLINICAL DATA:  Pneumonitis EXAM: PORTABLE CHEST 1 VIEW COMPARISON:  01/19/2021 FINDINGS: Shallow inspiration. Patchy infiltrates in the left lung and right lung base, similar to prior study and likely multifocal pneumonia. No pleural effusions. No pneumothorax. Mediastinal contours appear intact. Heart size is normal. Power port type central venous catheter on the right with tip over the cavoatrial junction region. Surgical clips in the right upper quadrant. IMPRESSION: Shallow inspiration. Patchy infiltrates in both lungs, likely multifocal pneumonia, without change. Electronically Signed   By: WLucienne CapersM.D.   On: 01/20/2021 03:56   DG Chest Port 1 View  Result Date: 01/19/2021 CLINICAL DATA:  Abnormality on previous chest x-ray in a 60year old male. EXAM: PORTABLE CHEST 1 VIEW COMPARISON:  CT from January 13, 2021, chest x-ray January 18, 2021. FINDINGS: RIGHT-sided Port-A-Cath terminates at the caval to atrial junction. Trachea is midline. Cardiomediastinal contours are stable. Interstitial and airspace opacities throughout the chest worse in the LEFT mid to upper chest, RIGHT upper chest lung bases bilaterally with similar appearance. On limited assessment there is no acute skeletal process. EKG leads project over the chest. IMPRESSION: No interval change in the appearance of the chest since previous imaging with continued decreased lung volumes and patchy bilateral opacities. Electronically Signed   By: GZetta BillsM.D.   On: 01/19/2021 07:59   DG Chest Port 1 View  Result Date: 01/18/2021 CLINICAL DATA:  Respiratory distress EXAM: PORTABLE CHEST 1 VIEW  COMPARISON:  01/17/2021 FINDINGS: Cardiac shadow is stable. Right-sided chest wall port is again seen. Lungs are hypoinflated. Patchy airspace opacities again identified in both lungs left greater than right but stable from the prior exam. IMPRESSION: Patchy airspace opacities bilaterally stable from the prior exam. Electronically Signed   By: MInez CatalinaM.D.   On: 01/18/2021 17:24   DG Chest Port 1 View  Result Date: 01/17/2021 CLINICAL DATA:  Worsening shortness of breath. EXAM: PORTABLE CHEST 1 VIEW COMPARISON:  01/14/2021 FINDINGS: 0826 hours. Low volume film. Patchy airspace disease again noted in both lungs, left greater than right. Cardiopericardial silhouette is at upper limits of normal for size. Right Port-A-Cath again noted.  Telemetry leads overlie the chest. IMPRESSION: Low volume film with patchy bilateral airspace disease, left greater than right. No substantial change. Electronically Signed   By: Misty Stanley M.D.   On: 01/17/2021 08:53   DG Chest Port 1 View  Result Date: 01/14/2021 CLINICAL DATA:  Shortness of breath.  History of cancer.  Diabetes. EXAM: PORTABLE CHEST 1 VIEW.  Patient is rotated. COMPARISON:  Chest x-ray 01/13/2021, CT chest 01/13/2021 FINDINGS: Accessed right chest wall Port-A-Cath in stable position. The heart and mediastinal contours are within normal limits. Low lung volumes with persistent diffuse patchy airspace opacities. No pleural effusion. No pneumothorax. No acute osseous abnormality. Right upper quadrant surgical clips. IMPRESSION: Low lung volumes with persistent diffuse patchy airspace opacities. Electronically Signed   By: Iven Finn M.D.   On: 01/14/2021 21:08   DG Chest Port 1 View  Result Date: 01/13/2021 CLINICAL DATA:  Shortness of breath EXAM: PORTABLE CHEST 1 VIEW COMPARISON:  Chest radiograph 07/29/2019 FINDINGS: There is a right chest wall port with the tip terminating in the lower SVC/cavoatrial junction. The cardiomediastinal silhouette  is within normal limits. Lung volumes are low with unchanged asymmetric elevation of the right hemidiaphragm. There are increased interstitial markings with diffuse reticular opacities throughout both lungs. There is no focal consolidation. There is no significant pleural effusion. There is no pneumothorax. There is no acute osseous abnormality. There is gaseous distention of the stomach and large bowel in the right upper quadrant. IMPRESSION: Low lung volumes with suspected mild pulmonary interstitial edema. Atypical/viral infection could have a similar appearance. Electronically Signed   By: Valetta Mole M.D.   On: 01/13/2021 14:24      ASSESSMENT/PLAN   Acute hypoxemic respiratory failure - present on admission  - COVID19 negative   - supplemental O2 during my evaluation 10l/min>>8L>>HFNC  -Respiratory viral panel-negative  -serum fungitell-negative  -legionella ab-negative  -nasal MRSA PCR-negative -Procalcitonin trend reviewed -strep pneumoniae ur AG -Histoplasma Ur Ag-negative -sputum resp cultures -AFB sputum expectorated specimen -sputum cytology  -Agree with Cefepime empirically  -Reduced Solumedrol to 40 BID - 01/15/21>>50 BID -reviewed pertinent imaging with patient today - ESR and CRP are elevated but improving -PT/OT for d/c planning  -please encourage patient to use incentive spirometer few times each hour while hospitalized.   -continue Cellcept 250 bid >>500 bid  -started lasix 20 bid -01/20/21 working well.  -UOP negatve 3L-01/22/21  Acute blood loss anemia    - noted Hb trending down from 14 to 10 -    -fecal occult blood test    - may be due to GI bleed due to eliquis - monitor for grossly visible bleeding and stable vital sings    Thank you for allowing me to participate in the care of this patient.   Patient/Family are satisfied with care plan and all questions have been answered.   This document was prepared using Dragon voice recognition software and may  include unintentional dictation errors.     Ottie Glazier, M.D.  Division of [place]

## 2021-01-22 NOTE — Progress Notes (Signed)
Inpatient Diabetes Program Recommendations  AACE/ADA: New Consensus Statement on Inpatient Glycemic Control (2015)  Target Ranges:  Prepandial:   less than 140 mg/dL      Peak postprandial:   less than 180 mg/dL (1-2 hours)      Critically ill patients:  140 - 180 mg/dL  Results for ANVITH, MAURIELLO" (MRN [id number]) as of 01/22/2021 10:12  Ref. Range 01/21/2021 08:12 01/21/2021 11:11 01/21/2021 16:55 01/21/2021 21:07  Glucose-Capillary Latest Ref Range: 70 - 99 mg/dL 319 (H)  30 units Novolog @0941   25 units Semglee @0942  330 (H)  30 units Novolog @1227  197 (H)  19 units Novolog  154 (H)  Results for JEREMYAH, JELLEY" (MRN [id number]) as of 01/22/2021 10:12  Ref. Range 01/22/2021 08:16  Glucose-Capillary Latest Ref Range: 70 - 99 mg/dL 302 (H)  30 units Novolog  25 units Semglee    Home DM Meds: Amaryl 2 mg daily     Metformin 500 mg TID  Current Orders: Semglee 25 units Daily      Novolog Resistant Correction Scale/ SSI (0-20 units) TID AC + HS      Novolog 15 units TID with meals    MD- Note Solumedrol remains at 50 mg BID.  CBG 302 this AM.  Please consider the following while pt remains on IV Solumedrol:  1. Increase Semglee to 30 units Daily  2. Increase Novolog Meal Coverage to 20 units TID with meals   --Will follow patient during hospitalization--  Wyn Quaker RN, MSN, CDE Diabetes Coordinator Inpatient Glycemic Control Team Team Pager: [phone] (8a-5p)

## 2021-01-22 NOTE — Progress Notes (Addendum)
PROGRESS NOTE    Edward Atkinson  QIO:[id number] DOB: 10-Dec-1960 DOA: 01/13/2021 PCP: Rusty Aus, MD    Assessment & Plan:   Principal Problem:   Acute on chronic respiratory failure with hypoxia [facility]) Active Problems:   Type 2 diabetes mellitus without complication, without long-term current use of insulin (HCC)   SOB (shortness of breath)   Interstitial lung disease (HCC)   Severe sepsis ([place])   CAP (community acquired pneumonia)   Elevated troponin   Protein calorie malnutrition (HCC)   Protein-calorie malnutrition, severe   Acute on chronic hypoxic respiratory failure: continue on supplemental oxygen and wean back to baseline as tolerated, still on HFNC. On 2L [place] at home chronically. CTA chest negative for PE, &  possible progressive ILD vs pneumonia. Completed abx course. Continue on IV steroids, cellcept and bronchodilators. Continue on bactrim for PCP prophylaxis & continue on acyclovir for HSV prophylaxis. Influenza & COVID19 were both neg   Sepsis : met criteria w/ leukocytosis, tachycardia and tachypnea & likely pneumonia. Resolved    Likely PAF: continue on cardizem, digoxin & eliquis as per cardio. Will need to f/u outpatient in 1-2 weeks w/ Dr. Saralyn Pilar. Cardio signed off   Normocytic anemia: H&H are labile. No need for a transfusion currently    Mild elevation of troponin: likely secondary to demand ischemia     DM2: well controlled. Continue on glargine, SSI w/ accuchecks    Hx of pulmonary embolism: continue on eliquis    Hx of follicular cell lymphoma: management per onco an outpatient    DVT prophylaxis: eliquis  Code Status: full  Family Communication:  Disposition Plan: likely d/c back home   Level of care: Progressive Cardiac  Status is: Inpatient  Remains inpatient appropriate because:IV treatments appropriate due to intensity of illness or inability to take PO and Inpatient level of care appropriate due to severity of illness, still  requiring HFNC  Dispo: The patient is from: Home              Anticipated d/c is to: Home              Patient currently is not medically stable to d/c.   Difficult to place patient : unclear   Consultants:  Cardio Pulmon   Procedures:   Antimicrobials:   Subjective: Pt c/o shortness of breath   Objective: Vitals:   01/21/21 2126 01/21/21 2343 01/22/21 0221 01/22/21 0500  BP:  (!) 132/92 109/80   Pulse:  (!) 107 89   Resp:  18 18   Temp:  97.9 F (36.6 C) (!) 97.5 F (36.4 C)   TempSrc:      SpO2: 96% 97% 95%   Weight:    71.4 kg  Height:        Intake/Output Summary (Last 24 hours) at 01/22/2021 0745 Last data filed at 01/22/2021 0500 Gross per 24 hour  Intake 1210 ml  Output 2550 ml  Net -1340 ml   Filed Weights   01/20/21 0500 01/21/21 0517 01/22/21 0500  Weight: 70.3 kg 74.1 kg 71.4 kg    Examination:  General exam: Appears calm & comfortable  Respiratory system: diminished breath sounds b/l Cardiovascular system: S1 &S2+. No rubs or clicks Gastrointestinal system: Abd is soft, NT, ND & hypoactive bowel sounds  Central nervous system: Alert and oriented. Moves all extremities  Psychiatry: Judgement and insight appear normal. Appropriate mood and affect    Data Reviewed: I have personally reviewed following labs and imaging  studies  CBC: Recent Labs  Lab 01/18/21 0424 01/19/21 0443 01/20/21 0437 01/21/21 0556 01/22/21 0624  WBC 12.4* 11.2* 11.3* 11.2* 14.7*  HGB 11.2* 11.2* 10.9* 12.4* 13.1  HCT 35.2* 34.6* 33.7* 37.7* 41.1  MCV 85.0 84.4 85.5 84.7 83.2  PLT 333 303 346 353 914   Basic Metabolic Panel: Recent Labs  Lab 01/16/21 0610 01/18/21 0424 01/19/21 0443 01/20/21 0437 01/21/21 0556 01/22/21 0624  NA 137 137 136 137 133* 135  K 4.2 4.0 4.1 4.3 4.2 4.2  CL 101 97* 98 97* 94* 92*  CO2 28 33* 33* 34* 31 33*  GLUCOSE 253* 94 263* 250* 262* 300*  BUN 19 23 24* 25* 29* 32*  CREATININE 0.55* 0.58* 0.41* 0.53* 0.55* 0.56*  CALCIUM  8.3* 8.9 8.6* 8.5* 8.9 8.4*  MG 2.1  --   --   --   --   --   PHOS 3.3  --   --  2.7  --   --    GFR: Estimated Creatinine Clearance: 96.7 mL/min (A) (by C-G formula based on SCr of 0.56 mg/dL (L)). Liver Function Tests: Recent Labs  Lab 01/20/21 0437  ALBUMIN 2.3*   No results for input(s): LIPASE, AMYLASE in the last 168 hours. No results for input(s): AMMONIA in the last 168 hours. Coagulation Profile: No results for input(s): INR, PROTIME in the last 168 hours. Cardiac Enzymes: No results for input(s): CKTOTAL, CKMB, CKMBINDEX, TROPONINI in the last 168 hours. BNP (last 3 results) No results for input(s): PROBNP in the last 8760 hours. HbA1C: No results for input(s): HGBA1C in the last 72 hours. CBG: Recent Labs  Lab 01/20/21 2250 01/21/21 0812 01/21/21 1111 01/21/21 1655 01/21/21 2107  GLUCAP 190* 319* 330* 197* 154*   Lipid Profile: No results for input(s): CHOL, HDL, LDLCALC, TRIG, CHOLHDL, LDLDIRECT in the last 72 hours. Thyroid Function Tests: No results for input(s): TSH, T4TOTAL, FREET4, T3FREE, THYROIDAB in the last 72 hours. Anemia Panel: No results for input(s): VITAMINB12, FOLATE, FERRITIN, TIBC, IRON, RETICCTPCT in the last 72 hours. Sepsis Labs: No results for input(s): PROCALCITON, LATICACIDVEN in the last 168 hours.   Recent Results (from the past 240 hour(s))  Resp Panel by RT-PCR (Flu A&B, Covid) Nasopharyngeal Swab     Status: None   Collection Time: 01/13/21  1:33 PM   Specimen: Nasopharyngeal Swab; Nasopharyngeal(NP) swabs in vial transport medium  Result Value Ref Range Status   SARS Coronavirus 2 by RT PCR NEGATIVE NEGATIVE Final    Comment: (NOTE) SARS-CoV-2 target nucleic acids are NOT DETECTED.  The SARS-CoV-2 RNA is generally detectable in upper respiratory specimens during the acute phase of infection. The lowest concentration of SARS-CoV-2 viral copies this assay can detect is 138 copies/mL. A negative result does not preclude  SARS-Cov-2 infection and should not be used as the sole basis for treatment or other patient management decisions. A negative result may occur with  improper specimen collection/handling, submission of specimen other than nasopharyngeal swab, presence of viral mutation(s) within the areas targeted by this assay, and inadequate number of viral copies(<138 copies/mL). A negative result must be combined with clinical observations, patient history, and epidemiological information. The expected result is Negative.  Fact Sheet for Patients:  EntrepreneurPulse.com.au  Fact Sheet for Healthcare Providers:  IncredibleEmployment.be  This test is no t yet approved or cleared by the [place] FDA and  has been authorized for detection and/or diagnosis of SARS-CoV-2 by FDA under an Emergency Use Authorization (EUA). This  EUA will remain  in effect (meaning this test can be used) for the duration of the COVID-19 declaration under Section 564(b)(1) of the Act, 21 U.S.C.section 360bbb-3(b)(1), unless the authorization is terminated  or revoked sooner.       Influenza A by PCR NEGATIVE NEGATIVE Final   Influenza B by PCR NEGATIVE NEGATIVE Final    Comment: (NOTE) The Xpert Xpress SARS-CoV-2/FLU/RSV plus assay is intended as an aid in the diagnosis of influenza from Nasopharyngeal swab specimens and should not be used as a sole basis for treatment. Nasal washings and aspirates are unacceptable for Xpert Xpress SARS-CoV-2/FLU/RSV testing.  Fact Sheet for Patients: EntrepreneurPulse.com.au  Fact Sheet for Healthcare Providers: IncredibleEmployment.be  This test is not yet approved or cleared by the [place] FDA and has been authorized for detection and/or diagnosis of SARS-CoV-2 by FDA under an Emergency Use Authorization (EUA). This EUA will remain in effect (meaning this test can be used) for the duration of  the COVID-19 declaration under Section 564(b)(1) of the Act, 21 U.S.C. section 360bbb-3(b)(1), unless the authorization is terminated or revoked.  Performed at [facility], [address] [place], [place] [address]   Blood culture (routine x 2)     Status: None   Collection Time: 01/13/21  6:20 PM   Specimen: BLOOD  Result Value Ref Range Status   Specimen Description BLOOD RIGHT ANTECUBITAL  Final   Special Requests   Final    BOTTLES DRAWN AEROBIC AND ANAEROBIC Blood Culture adequate volume   Culture   Final    NO GROWTH 5 DAYS Performed at [facility], [address] [place], [place] [address]    Report Status 01/18/2021 FINAL  Final  Blood culture (routine x 2)     Status: None   Collection Time: 01/13/21  6:20 PM   Specimen: BLOOD  Result Value Ref Range Status   Specimen Description BLOOD LEFT ANTECUBITAL  Final   Special Requests   Final    BOTTLES DRAWN AEROBIC AND ANAEROBIC Blood Culture adequate volume   Culture   Final    NO GROWTH 5 DAYS Performed at [facility], [address] [place],  [address]    Report Status 01/18/2021 FINAL  Final  Respiratory (~20 pathogens) panel by PCR     Status: None   Collection Time: 01/15/21  2:01 PM   Specimen: Nasopharyngeal Swab; Respiratory  Result Value Ref Range Status   Adenovirus NOT DETECTED NOT DETECTED Final   Coronavirus 229E NOT DETECTED NOT DETECTED Final    Comment: (NOTE) The Coronavirus on the Respiratory Panel, DOES NOT test for the novel  Coronavirus (2019 nCoV)    Coronavirus HKU1 NOT DETECTED NOT DETECTED Final   Coronavirus NL63 NOT DETECTED NOT DETECTED Final   Coronavirus OC43 NOT DETECTED NOT DETECTED Final   Metapneumovirus NOT DETECTED NOT DETECTED Final   Rhinovirus / Enterovirus NOT DETECTED NOT DETECTED Final   Influenza A NOT DETECTED NOT DETECTED Final   Influenza B NOT DETECTED NOT DETECTED Final   Parainfluenza Virus 1 NOT DETECTED NOT DETECTED  Final   Parainfluenza Virus 2 NOT DETECTED NOT DETECTED Final   Parainfluenza Virus 3 NOT DETECTED NOT DETECTED Final   Parainfluenza Virus 4 NOT DETECTED NOT DETECTED Final   Respiratory Syncytial Virus NOT DETECTED NOT DETECTED Final   Bordetella pertussis NOT DETECTED NOT DETECTED Final   Bordetella Parapertussis NOT DETECTED NOT DETECTED Final   Chlamydophila pneumoniae NOT DETECTED NOT DETECTED Final  Mycoplasma pneumoniae NOT DETECTED NOT DETECTED Final    Comment: Performed at [facility], [place] [address] [place], [place] [address]  MRSA Next Gen by PCR, Nasal     Status: None   Collection Time: 01/15/21  2:02 PM   Specimen: Nasal Mucosa; Nasal Swab  Result Value Ref Range Status   MRSA by PCR Next Gen NOT DETECTED NOT DETECTED Final    Comment: (NOTE) The GeneXpert MRSA Assay (FDA approved for NASAL specimens only), is one component of a comprehensive MRSA colonization surveillance program. It is not intended to diagnose MRSA infection nor to guide or monitor treatment for MRSA infections. Test performance is not FDA approved in patients less than 35 years old. Performed at [facility], [address] [place],  [address]   Aspergillus Ag, BAL/Serum     Status: None   Collection Time: 01/19/21  4:43 AM  Result Value Ref Range Status   Aspergillus Ag, BAL/Serum 0.02 0.00 - 0.49 Index Final    Comment: (NOTE) Performed At: [facility] [place], [place] [id number] Rush Farmer MD MN:[id number]          Radiology Studies: No results found.      Scheduled Meds:  acyclovir  400 mg Oral BID   apixaban  5 mg Oral BID   Chlorhexidine Gluconate Cloth  6 each Topical Daily   digoxin  0.125 mg Oral Daily   diltiazem  180 mg Oral Daily   docusate sodium  200 mg Oral BID   feeding supplement (NEPRO CARB STEADY)  237 mL Oral TID BM   furosemide  20 mg Intravenous BID   guaiFENesin  1,200 mg Oral BID   insulin aspart   0-20 Units Subcutaneous TID WC   insulin aspart  0-5 Units Subcutaneous QHS   insulin aspart  15 Units Subcutaneous TID WC   insulin glargine-yfgn  25 Units Subcutaneous Daily   methylPREDNISolone (SOLU-MEDROL) injection  50 mg Intravenous Q12H   multivitamin with minerals  1 tablet Oral Daily   mycophenolate  500 mg Oral BID   sodium chloride flush  10-40 mL Intracatheter Q12H   sulfamethoxazole-trimethoprim  1 tablet Oral Daily   Continuous Infusions:     LOS: 9 days    Time spent: 31 mins     Wyvonnia Dusky, MD [facility] Pager [phone]  If 7PM-7AM, please contact night-coverage  01/22/2021, 7:45 AM

## 2021-01-23 DIAGNOSIS — I480 Paroxysmal atrial fibrillation: Secondary | ICD-10-CM | POA: Diagnosis not present

## 2021-01-23 DIAGNOSIS — J9621 Acute and chronic respiratory failure with hypoxia: Secondary | ICD-10-CM | POA: Diagnosis not present

## 2021-01-23 DIAGNOSIS — J849 Interstitial pulmonary disease, unspecified: Secondary | ICD-10-CM | POA: Diagnosis not present

## 2021-01-23 LAB — GLUCOSE, CAPILLARY
Glucose-Capillary: 313 mg/dL — ABNORMAL HIGH (ref 70–99)
Glucose-Capillary: 407 mg/dL — ABNORMAL HIGH (ref 70–99)
Glucose-Capillary: 173 mg/dL — ABNORMAL HIGH (ref 70–99)
Glucose-Capillary: 221 mg/dL — ABNORMAL HIGH (ref 70–99)

## 2021-01-23 LAB — CBC
HCT: 42.9 % (ref 39.0–52.0)
Hemoglobin: 13.7 g/dL (ref 13.0–17.0)
MCH: 26.6 pg (ref 26.0–34.0)
MCHC: 31.9 g/dL (ref 30.0–36.0)
MCV: 83.3 fL (ref 80.0–100.0)
Platelets: 402 10*3/uL — ABNORMAL HIGH (ref 150–400)
RBC: 5.15 MIL/uL (ref 4.22–5.81)
RDW: 17.2 % — ABNORMAL HIGH (ref 11.5–15.5)
WBC: 17.5 10*3/uL — ABNORMAL HIGH (ref 4.0–10.5)
nRBC: 0 % (ref 0.0–0.2)

## 2021-01-23 LAB — BASIC METABOLIC PANEL
Anion gap: 8 (ref 5–15)
BUN: 34 mg/dL — ABNORMAL HIGH (ref 8–23)
CO2: 35 mmol/L — ABNORMAL HIGH (ref 22–32)
Calcium: 8.6 mg/dL — ABNORMAL LOW (ref 8.9–10.3)
Chloride: 95 mmol/L — ABNORMAL LOW (ref 98–111)
Creatinine, Ser: 0.58 mg/dL — ABNORMAL LOW (ref 0.61–1.24)
GFR, Estimated: >60 mL/min (ref >60)
Glucose, Bld: 286 mg/dL — ABNORMAL HIGH (ref 70–99)
Potassium: 4.9 mmol/L (ref 3.5–5.1)
Sodium: 138 mmol/L (ref 135–145)

## 2021-01-23 MED ORDER — INSULIN ASPART 100 UNIT/ML IJ SOLN
20.0000 [IU] | Freq: Three times a day (TID) | INTRAMUSCULAR | Status: DC
  Administered 2021-01-24 – 2021-01-26 (×8): 20 [IU] via SUBCUTANEOUS
  Filled 2021-01-23 (×9): qty 1

## 2021-01-23 MED ORDER — SULFAMETHOXAZOLE-TRIMETHOPRIM 400-80 MG PO TABS
1.0000 | ORAL_TABLET | Freq: Two times a day (BID) | ORAL | Status: DC
  Administered 2021-01-23 – 2021-02-19 (×54): 1 via ORAL
  Filled 2021-01-23 (×58): qty 1

## 2021-01-23 NOTE — Progress Notes (Signed)
Pulmonary Medicine          Date: 01/23/2021,   MRN# [id number] Edward Atkinson 01/18/1961     AdmissionWeight: 63.5 kg                 CurrentWeight: 71.8 kg   Referring physician: Dr Roderic Palau   CHIEF COMPLAINT:   Acute on chronic hypoxemic respiratory failure   HISTORY OF PRESENT ILLNESS   60 yo M w/hx of chronic hypoxemia and ILD post COVID19, T2DM, PE on eliquis, Lymphoma s/p Rituxan which is completed.  Came in due to worsening SOB and DOE specifically mild exertional defacation/urination with feelings of presyncope and severe dyspnea which started to get worse post tapering from steroid appx few days prior to onset of symptom progression.  He was placed on solumedrol and reported mild improvement.  He is on 10L /min [place] during my evaluation.  He had CT chest done with PE protocol and noted to have bilateral GGO infiltrates worse compared to June study. PCCM consultation for further evaluation and management.     01/17/21-  patient seems to be slighly clinically worse post reduction of IV steroids yesterday indicative of ongoing ILD exacerbation, thus far infectious workup is negative but inflammatory biomarkers are highly elevated also pointing towards ILD exacerbation.  There is new CXR in process this morning for interval changes. We discussed increasing steroids slightly and giving more time.  I will also initiate steroid sparing immunomodulation with BID cellcept as patient will likely need therapy for prolonged time period.  Compensatory tachycardia noted appreciate cardiology team.  Sugars with peaks and valleys with adjusted steroid therapy.  Patient with muscle wasting and protein calorie malnutrition but eating better this am.  01/18/21- patient is on HFNC at 60%, feels better, has not been able to tolerate BIPAP.  Wife is at bedside.  BP is borderline low MAP 76.  He has negative infectious workup and thus far ILD exacerbation seems to be main differential. He  continues to have peaks and valleys in blood glucose.  We started steroid sparing regimen with cellcept bid 250 mg and will increase dose as we taper off prednisone.  Remains tachyarrtythmic but improved. Appreciate everyone involved.     01/19/21- patient seen and examined at bedside.  Wife present during evaluation.  Ow HFNC weaned to 41%/45L/MIN.  CRP is still up, have increased cellcept and reducing solumedrol.  Patient felt stronger physically and was able to get OOB to chair. Cough is less now.  He is non-labored with breathing no accessory mm use today. He had dark stools with reduced hb, FOBT ordred.  Haptoglobin is elevated. Pathology slide review ordered.   01/20/21- patient is essentially unchanged from yesterday from pulmonary perspective. CXR also unchanged,  bloodwork with reduction in CRP.  Plan to wean O2 and continue OOB with PT/OT,  Continue current steroid dose and Cellcept.   01/21/21- patient is stable on HFNC.  He had small set back with increased O2 req after exerting himself in bathroom.  HFNC from 41/45 to 54/45.   He diuresed very well with lasix 20 bid >2L urine in past 24h.  Tachycardia is improved. He does not feel weaker and infact states he feels slowly improved.   01/22/21- I spoke to patient regarding goals of care and recommend DNR.  He is agreeable but I asked him to discuss with wife and family out of respect and we will revisit tommorow.  He states he is feeling better  and CRP is trending down but he remains on HFNC 60/40.  He was able to get OOB today and walked to bathroom but experienced mild desaturation with quick recovery "not much issues".      01/23/21- patient is still on high flow nasal canula , he is on 56%/45L.  We reviewed code status and he is still in discussion with family regarding DNR.   We discussed lung transplant per family request and this may be a future option if he is strong enough and recovers.  I have increased insluin TID to 20 from 15 and  increased bactrim to bid SS     PAST MEDICAL HISTORY   Past Medical History:  Diagnosis Date  . Chronic respiratory failure with hypoxia (HCC)    2L [place] continuous  . COVID-19   . Diabetes mellitus without complication ([place])   . Diverticulitis   . Follicular lymphoma of intra-abdominal lymph nodes ([place]) 02/15/2019  . ILD (interstitial lung disease) ([place])      SURGICAL HISTORY   Past Surgical History:  Procedure Laterality Date  . CHOLECYSTECTOMY    . COLON RESECTION     DUE TO DIVERTICULITIS  . FLEXIBLE BRONCHOSCOPY N/A 12/02/2019   Procedure: FLEXIBLE BRONCHOSCOPY;  Surgeon: Ottie Glazier, MD;  Location: [facility];  Service: Thoracic;  Laterality: N/A;  . PORTA CATH INSERTION N/A 03/11/2019   Procedure: PORTA CATH INSERTION;  Surgeon: Algernon Huxley, MD;  Location: [facility];  Service: Cardiovascular;  Laterality: N/A;  . PULMONARY THROMBECTOMY N/A 01/29/2019   Procedure: PULMONARY THROMBECTOMY;  Surgeon: Algernon Huxley, MD;  Location: [place] [facility];  Service: Cardiovascular;  Laterality: N/A;     FAMILY HISTORY   Family History  Problem Relation Age of Onset  . Diabetes Brother      SOCIAL HISTORY   Social History   Tobacco Use  . Smoking status: Never  . Smokeless tobacco: Never  Vaping Use  . Vaping Use: Never used  Substance Use Topics  . Alcohol use: Yes    Alcohol/week: 0.0 - 2.0 standard drinks    Comment: occasional  . Drug use: Never     MEDICATIONS    Home Medication:    Current Medication:  Current Facility-Administered Medications:  .  acetaminophen (TYLENOL) tablet 650 mg, 650 mg, Oral, Q6H PRN, 650 mg at 01/18/21 1411 **OR** acetaminophen (TYLENOL) suppository 650 mg, 650 mg, Rectal, Q6H PRN, Howerter, Justin B, DO .  acyclovir (ZOVIRAX) 200 MG capsule 400 mg, 400 mg, Oral, BID, Kathie Dike, MD, 400 mg at 01/23/21 0955 .  apixaban (ELIQUIS) tablet 5 mg, 5 mg, Oral, BID, Beers, Shanon Brow, RPH, 5 mg at 01/23/21 0955 .   Chlorhexidine Gluconate Cloth 2 % PADS 6 each, 6 each, Topical, Daily, Kathie Dike, MD, 6 each at 01/23/21 1208 .  digoxin (LANOXIN) tablet 0.125 mg, 0.125 mg, Oral, Daily, Memon, Jehanzeb, MD, 0.125 mg at 01/23/21 0955 .  diltiazem (CARDIZEM CD) 24 hr capsule 180 mg, 180 mg, Oral, Daily, Paraschos, Alexander, MD, 180 mg at 01/23/21 0955 .  docusate sodium (COLACE) capsule 200 mg, 200 mg, Oral, BID, Wyvonnia Dusky, MD .  feeding supplement (NEPRO CARB STEADY) liquid 237 mL, 237 mL, Oral, TID BM, Kathie Dike, MD, Last Rate: 0 mL/hr at 01/21/21 2130, 237 mL at 01/23/21 1234 .  furosemide (LASIX) injection 20 mg, 20 mg, Intravenous, BID, Lanney Gins, Adabelle Griffiths, MD, 20 mg at 01/23/21 0955 .  guaiFENesin (MUCINEX) 12 hr tablet 1,200 mg,  1,200 mg, Oral, BID, Kathie Dike, MD, 1,200 mg at 01/23/21 0955 .  guaiFENesin-dextromethorphan (ROBITUSSIN DM) 100-10 MG/5ML syrup 5 mL, 5 mL, Oral, Q4H PRN, Kathie Dike, MD, 5 mL at 01/17/21 0648 .  insulin aspart (novoLOG) injection 0-20 Units, 0-20 Units, Subcutaneous, TID WC, Kathie Dike, MD, 20 Units at 01/23/21 1233 .  insulin aspart (novoLOG) injection 0-5 Units, 0-5 Units, Subcutaneous, QHS, Kathie Dike, MD, 4 Units at 01/19/21 2135 .  insulin aspart (novoLOG) injection 15 Units, 15 Units, Subcutaneous, TID WC, Wyvonnia Dusky, MD, 15 Units at 01/23/21 1234 .  insulin glargine-yfgn (SEMGLEE) injection 25 Units, 25 Units, Subcutaneous, Daily, Wyvonnia Dusky, MD, 25 Units at 01/23/21 0955 .  levalbuterol (XOPENEX) nebulizer solution 1.25 mg, 1.25 mg, Nebulization, Q4H PRN, Howerter, Justin B, DO, 1.25 mg at 01/17/21 2011 .  methylPREDNISolone sodium succinate (SOLU-MEDROL) 125 mg/2 mL injection 50 mg, 50 mg, Intravenous, Q12H, Lanney Gins, Torin Modica, MD, 50 mg at 01/23/21 0954 .  multivitamin with minerals tablet 1 tablet, 1 tablet, Oral, Daily, Kathie Dike, MD, 1 tablet at 01/23/21 0955 .  mycophenolate (CELLCEPT) capsule 500 mg, 500 mg,  Oral, BID, Lanney Gins, Lakeasha Petion, MD, 500 mg at 01/23/21 0955 .  sodium chloride flush (NS) 0.9 % injection 10-40 mL, 10-40 mL, Intracatheter, Q12H, Kathie Dike, MD, 20 mL at 01/23/21 0956 .  sodium chloride flush (NS) 0.9 % injection 10-40 mL, 10-40 mL, Intracatheter, PRN, Kathie Dike, MD .  sulfamethoxazole-trimethoprim (BACTRIM) 400-80 MG per tablet 1 tablet, 1 tablet, Oral, Daily, Ottie Glazier, MD, 1 tablet at 01/23/21 5009  Facility-Administered Medications Ordered in Other Encounters:  .  heparin lock flush 100 unit/mL, 500 Units, Intravenous, Once, Earlie Server, MD    ALLERGIES   Penicillins     REVIEW OF SYSTEMS    Review of Systems:  Gen:  Denies  fever, sweats, chills weigh loss  HEENT: Denies blurred vision, double vision, ear pain, eye pain, hearing loss, nose bleeds, sore throat Cardiac:  No dizziness, chest pain or heaviness, chest tightness,edema Resp:   Denies cough or sputum porduction, shortness of breath,wheezing, hemoptysis,  Gi: Denies swallowing difficulty, stomach pain, nausea or vomiting, diarrhea, constipation, bowel incontinence Gu:  Denies bladder incontinence, burning urine Ext:   Denies Joint pain, stiffness or swelling Skin: Denies  skin rash, easy bruising or bleeding or hives Endoc:  Denies polyuria, polydipsia , polyphagia or weight change Psych:   Denies depression, insomnia or hallucinations   Other:  All other systems negative   VS: BP 119/78 (BP Location: Right Arm)   Pulse 84   Temp 98.3 F (36.8 C) (Oral)   Resp 18   Ht 6' (1.829 m)   Wt 71.8 kg   SpO2 95%   BMI 21.47 kg/m      PHYSICAL EXAM    GENERAL:NAD, no fevers, chills, no weakness no fatigue HEAD: Normocephalic, atraumatic.  EYES: Pupils equal, round, reactive to light. Extraocular muscles intact. No scleral icterus.  MOUTH: Moist mucosal membrane. Dentition intact. No abscess noted.  EAR, NOSE, THROAT: Clear without exudates. No external lesions.  NECK: Supple.  No thyromegaly. No nodules. No JVD.  PULMONARY: Bilateral rhonchi CARDIOVASCULAR: S1 and S2. Regular rate and rhythm. No murmurs, rubs, or gallops. No edema. Pedal pulses 2+ bilaterally.  GASTROINTESTINAL: Soft, nontender, nondistended. No masses. Positive bowel sounds. No hepatosplenomegaly.  MUSCULOSKELETAL: No swelling, clubbing, or edema. Range of motion full in all extremities.  NEUROLOGIC: Cranial nerves II through XII are intact. No gross focal neurological deficits. Sensation  intact. Reflexes intact.  SKIN: No ulceration, lesions, rashes, or cyanosis. Skin warm and dry. Turgor intact.  PSYCHIATRIC: Mood, affect within normal limits. The patient is awake, alert and oriented x 3. Insight, judgment intact.       IMAGING    CT Angio Chest PE W/Cm &/Or Wo Cm  Result Date: 01/13/2021 CLINICAL DATA:  Respiratory distress, hypoxia EXAM: CT ANGIOGRAPHY CHEST WITH CONTRAST TECHNIQUE: Multidetector CT imaging of the chest was performed using the standard protocol during bolus administration of intravenous contrast. Multiplanar CT image reconstructions and MIPs were obtained to evaluate the vascular anatomy. CONTRAST:  43m OMNIPAQUE IOHEXOL 350 MG/ML SOLN COMPARISON:  10/26/2020, 01/13/2021 FINDINGS: Cardiovascular: This is a technically adequate evaluation of the pulmonary vasculature. No filling defects or pulmonary emboli. The heart is unremarkable without pericardial effusion. No evidence of thoracic aortic aneurysm or dissection. Right chest wall port via internal jugular approach tip within the superior vena cava. Mediastinum/Nodes: No pathologic adenopathy within the mediastinum, hila, or axilla. Thyroid, trachea, and esophagus are unremarkable. Lungs/Pleura: There has been interval progression of the multifocal ground-glass airspace disease seen previously. Changes are most pronounced within the dependent lower lobes. No effusion or pneumothorax. The central airways are patent, with persistent  bronchiectasis. Upper Abdomen: No acute abnormality. Musculoskeletal: No acute or destructive bony lesions. Reconstructed images demonstrate no additional findings. Review of the MIP images confirms the above findings. IMPRESSION: 1. No evidence of pulmonary embolus. 2. Progressive multifocal bilateral ground-glass airspace disease, greatest at the lung bases. The differential diagnosis would include progressive postinflammatory scarring and fibrosis, multifocal atypical pneumonia, or hypersensitivity/drug toxicity. 3. Stable bronchiectasis. Electronically Signed   By: MRanda NgoM.D.   On: 01/13/2021 17:13   DG Chest Port 1 View  Result Date: 01/22/2021 CLINICAL DATA:  Hypoxia, shortness of breath. EXAM: PORTABLE CHEST 1 VIEW COMPARISON:  January 20, 2021. FINDINGS: Stable cardiomediastinal silhouette. Right internal jugular Port-A-Cath is unchanged in position. Hypoinflation of the lungs is noted. Stable bilateral lung opacities are noted, left greater than right, most consistent with multifocal pneumonia. Bony thorax is unremarkable. IMPRESSION: Hypoinflation of the lungs. Stable bilateral lung opacities are noted, left greater than right, most consistent with multifocal pneumonia. Electronically Signed   By: JMarijo ConceptionM.D.   On: 01/22/2021 19:26   DG Chest Port 1 View  Result Date: 01/20/2021 CLINICAL DATA:  Pneumonitis EXAM: PORTABLE CHEST 1 VIEW COMPARISON:  01/19/2021 FINDINGS: Shallow inspiration. Patchy infiltrates in the left lung and right lung base, similar to prior study and likely multifocal pneumonia. No pleural effusions. No pneumothorax. Mediastinal contours appear intact. Heart size is normal. Power port type central venous catheter on the right with tip over the cavoatrial junction region. Surgical clips in the right upper quadrant. IMPRESSION: Shallow inspiration. Patchy infiltrates in both lungs, likely multifocal pneumonia, without change. Electronically Signed   By: WLucienne CapersM.D.   On: 01/20/2021 03:56   DG Chest Port 1 View  Result Date: 01/19/2021 CLINICAL DATA:  Abnormality on previous chest x-ray in a 60year old male. EXAM: PORTABLE CHEST 1 VIEW COMPARISON:  CT from January 13, 2021, chest x-ray January 18, 2021. FINDINGS: RIGHT-sided Port-A-Cath terminates at the caval to atrial junction. Trachea is midline. Cardiomediastinal contours are stable. Interstitial and airspace opacities throughout the chest worse in the LEFT mid to upper chest, RIGHT upper chest lung bases bilaterally with similar appearance. On limited assessment there is no acute skeletal process. EKG leads project over the chest. IMPRESSION: No interval change  in the appearance of the chest since previous imaging with continued decreased lung volumes and patchy bilateral opacities. Electronically Signed   By: Zetta Bills M.D.   On: 01/19/2021 07:59   DG Chest Port 1 View  Result Date: 01/18/2021 CLINICAL DATA:  Respiratory distress EXAM: PORTABLE CHEST 1 VIEW COMPARISON:  01/17/2021 FINDINGS: Cardiac shadow is stable. Right-sided chest wall port is again seen. Lungs are hypoinflated. Patchy airspace opacities again identified in both lungs left greater than right but stable from the prior exam. IMPRESSION: Patchy airspace opacities bilaterally stable from the prior exam. Electronically Signed   By: Inez Catalina M.D.   On: 01/18/2021 17:24   DG Chest Port 1 View  Result Date: 01/17/2021 CLINICAL DATA:  Worsening shortness of breath. EXAM: PORTABLE CHEST 1 VIEW COMPARISON:  01/14/2021 FINDINGS: 0826 hours. Low volume film. Patchy airspace disease again noted in both lungs, left greater than right. Cardiopericardial silhouette is at upper limits of normal for size. Right Port-A-Cath again noted. Telemetry leads overlie the chest. IMPRESSION: Low volume film with patchy bilateral airspace disease, left greater than right. No substantial change. Electronically Signed   By: Misty Stanley M.D.    On: 01/17/2021 08:53   DG Chest Port 1 View  Result Date: 01/14/2021 CLINICAL DATA:  Shortness of breath.  History of cancer.  Diabetes. EXAM: PORTABLE CHEST 1 VIEW.  Patient is rotated. COMPARISON:  Chest x-ray 01/13/2021, CT chest 01/13/2021 FINDINGS: Accessed right chest wall Port-A-Cath in stable position. The heart and mediastinal contours are within normal limits. Low lung volumes with persistent diffuse patchy airspace opacities. No pleural effusion. No pneumothorax. No acute osseous abnormality. Right upper quadrant surgical clips. IMPRESSION: Low lung volumes with persistent diffuse patchy airspace opacities. Electronically Signed   By: Iven Finn M.D.   On: 01/14/2021 21:08   DG Chest Port 1 View  Result Date: 01/13/2021 CLINICAL DATA:  Shortness of breath EXAM: PORTABLE CHEST 1 VIEW COMPARISON:  Chest radiograph 07/29/2019 FINDINGS: There is a right chest wall port with the tip terminating in the lower SVC/cavoatrial junction. The cardiomediastinal silhouette is within normal limits. Lung volumes are low with unchanged asymmetric elevation of the right hemidiaphragm. There are increased interstitial markings with diffuse reticular opacities throughout both lungs. There is no focal consolidation. There is no significant pleural effusion. There is no pneumothorax. There is no acute osseous abnormality. There is gaseous distention of the stomach and large bowel in the right upper quadrant. IMPRESSION: Low lung volumes with suspected mild pulmonary interstitial edema. Atypical/viral infection could have a similar appearance. Electronically Signed   By: Valetta Mole M.D.   On: 01/13/2021 14:24      ASSESSMENT/PLAN   Acute hypoxemic respiratory failure - present on admission  - COVID19 negative   - supplemental O2 during my evaluation 10l/min>>8L>>HFNC  -Respiratory viral panel-negative  -serum fungitell-negative  -legionella ab-negative  -nasal MRSA PCR-negative -Procalcitonin trend  reviewed -strep pneumoniae ur AG -Histoplasma Ur Ag-negative -sputum resp cultures -AFB sputum expectorated specimen -sputum cytology  -Agree with Cefepime empirically  -Reduced Solumedrol to 40 BID - 01/15/21>>50 BID -reviewed pertinent imaging with patient today - ESR and CRP are elevated but improving -PT/OT for d/c planning  -please encourage patient to use incentive spirometer few times each hour while hospitalized.   -continue Cellcept 250 bid >>500 bid  -started lasix 20 bid -01/20/21 working well.  -UOP negatve 3L-01/22/21  Acute blood loss anemia    - noted Hb trending down from 14 to 10 -    -  fecal occult blood test    - may be due to GI bleed due to eliquis - monitor for grossly visible bleeding and stable vital sings    Thank you for allowing me to participate in the care of this patient.   Patient/Family are satisfied with care plan and all questions have been answered.   This document was prepared using Dragon voice recognition software and may include unintentional dictation errors.     Ottie Glazier, M.D.  Division of [facility]

## 2021-01-23 NOTE — Progress Notes (Signed)
PROGRESS NOTE    Edward Atkinson  ZDG:[id number] DOB: 07-19-1960 DOA: 01/13/2021 PCP: Rusty Aus, MD    Assessment & Plan:   Principal Problem:   Acute on chronic respiratory failure with hypoxia [facility]) Active Problems:   Type 2 diabetes mellitus without complication, without long-term current use of insulin (HCC)   SOB (shortness of breath)   Interstitial lung disease (HCC)   Severe sepsis ([place])   CAP (community acquired pneumonia)   Elevated troponin   Protein calorie malnutrition (HCC)   Protein-calorie malnutrition, severe   Acute on chronic hypoxic respiratory failure: continue on supplemental oxygen and wean back to baseline as tolerated, still on HFNC. On 2L [place] at home chronically. CTA chest negative for PE, &  possible progressive ILD vs pneumonia. Completed abx course. Continue on lasix, IV steroids, cellcept & bronchodilators as per pulmon. Continue on bactrim for PCP prophylaxis & continue on acyclovir for HSV prophylaxis. Influenza & COVID19 were both neg   Sepsis : met criteria w/ leukocytosis, tachycardia and tachypnea & likely pneumonia. Resolved    Likely PAF: continue on digoxin, cardizem & eliquis as per cardio. Outpatient f/u in 1-2 weeks w/ Dr. Saralyn Pilar. [place] signed off   Normocytic anemia: H&H are labile, WNL today    Mild elevation of troponin: likely secondary to demand ischemia   Thrombocytosis: etiology unclear. Will continue to monitor    DM2: well controlled, w/ HbA1c 6.6. Continue on glargine, SSI w/ accuchecks    Hx of pulmonary embolism: continue on eliquis     Hx of follicular cell lymphoma: management per onco an outpatient    DVT prophylaxis: eliquis  Code Status: full  Family Communication:  Disposition Plan: likely d/c back home   Level of care: Progressive Cardiac  Status is: Inpatient  Remains inpatient appropriate because:IV treatments appropriate due to intensity of illness or inability to take PO and Inpatient level of  care appropriate due to severity of illness, still requiring HFNC  Dispo: The patient is from: Home              Anticipated d/c is to: Home              Patient currently is not medically stable to d/c.   Difficult to place patient : unclear   Consultants:  Cardio Pulmon   Procedures:   Antimicrobials:   Subjective: Pt c/o productive cough   Objective: Vitals:   01/22/21 2006 01/22/21 2115 01/22/21 2313 01/23/21 0414  BP: 121/75  103/69 108/75  Pulse: (!) 54 91 90 77  Resp: 18 (!) _0 Temp: 97.9 F (36.6 C)  97.9 F (36.6 C) 98 F (36.7 C)  TempSrc:      SpO2: 97% 95% 98% 98%  Weight:    71.8 kg  Height:        Intake/Output Summary (Last 24 hours) at 01/23/2021 0740 Last data filed at 01/23/2021 0400 Gross per 24 hour  Intake 1197 ml  Output 2850 ml  Net -1653 ml   Filed Weights   01/21/21 0517 01/22/21 0500 01/23/21 0414  Weight: 74.1 kg 71.4 kg 71.8 kg    Examination:  General exam: Appears comfortable  Respiratory system: decreased breath sounds b/l  Cardiovascular system: S1 & S2+. No rubs or clicks  Gastrointestinal system: Abd is soft, NT, ND & normal bowel sounds Central nervous system: Alert and oriented. Moves all extremities  Psychiatry: Judgement and insight appear normal. Appropriate mood and affect   Data  Reviewed: I have personally reviewed following labs and imaging studies  CBC: Recent Labs  Lab 01/19/21 0443 01/20/21 0437 01/21/21 0556 01/22/21 0624 01/23/21 0531  WBC 11.2* 11.3* 11.2* 14.7* 17.5*  HGB 11.2* 10.9* 12.4* 13.1 13.7  HCT 34.6* 33.7* 37.7* 41.1 42.9  MCV 84.4 85.5 84.7 83.2 83.3  PLT 303 346 353 398 701*   Basic Metabolic Panel: Recent Labs  Lab 01/19/21 0443 01/20/21 0437 01/21/21 0556 01/22/21 0624 01/23/21 0531  NA 136 137 133* 135 138  K 4.1 4.3 4.2 4.2 4.9  CL 98 97* 94* 92* 95*  CO2 33* 34* 31 33* 35*  GLUCOSE 263* 250* 262* 300* 286*  BUN 24* 25* 29* 32* 34*  CREATININE 0.41* 0.53* 0.55*  0.56* 0.58*  CALCIUM 8.6* 8.5* 8.9 8.4* 8.6*  PHOS  --  2.7  --   --   --    GFR: Estimated Creatinine Clearance: 97.2 mL/min (A) (by C-G formula based on SCr of 0.58 mg/dL (L)). Liver Function Tests: Recent Labs  Lab 01/20/21 0437  ALBUMIN 2.3*   No results for input(s): LIPASE, AMYLASE in the last 168 hours. No results for input(s): AMMONIA in the last 168 hours. Coagulation Profile: No results for input(s): INR, PROTIME in the last 168 hours. Cardiac Enzymes: No results for input(s): CKTOTAL, CKMB, CKMBINDEX, TROPONINI in the last 168 hours. BNP (last 3 results) No results for input(s): PROBNP in the last 8760 hours. HbA1C: No results for input(s): HGBA1C in the last 72 hours. CBG: Recent Labs  Lab 01/21/21 2107 01/22/21 0816 01/22/21 1229 01/22/21 1630 01/22/21 2046  GLUCAP 154* 302* 339* 261* 178*   Lipid Profile: No results for input(s): CHOL, HDL, LDLCALC, TRIG, CHOLHDL, LDLDIRECT in the last 72 hours. Thyroid Function Tests: No results for input(s): TSH, T4TOTAL, FREET4, T3FREE, THYROIDAB in the last 72 hours. Anemia Panel: No results for input(s): VITAMINB12, FOLATE, FERRITIN, TIBC, IRON, RETICCTPCT in the last 72 hours. Sepsis Labs: No results for input(s): PROCALCITON, LATICACIDVEN in the last 168 hours.   Recent Results (from the past 240 hour(s))  Resp Panel by RT-PCR (Flu A&B, Covid) Nasopharyngeal Swab     Status: None   Collection Time: 01/13/21  1:33 PM   Specimen: Nasopharyngeal Swab; Nasopharyngeal(NP) swabs in vial transport medium  Result Value Ref Range Status   SARS Coronavirus 2 by RT PCR NEGATIVE NEGATIVE Final    Comment: (NOTE) SARS-CoV-2 target nucleic acids are NOT DETECTED.  The SARS-CoV-2 RNA is generally detectable in upper respiratory specimens during the acute phase of infection. The lowest concentration of SARS-CoV-2 viral copies this assay can detect is 138 copies/mL. A negative result does not preclude SARS-Cov-2 infection  and should not be used as the sole basis for treatment or other patient management decisions. A negative result may occur with  improper specimen collection/handling, submission of specimen other than nasopharyngeal swab, presence of viral mutation(s) within the areas targeted by this assay, and inadequate number of viral copies(<138 copies/mL). A negative result must be combined with clinical observations, patient history, and epidemiological information. The expected result is Negative.  Fact Sheet for Patients:  EntrepreneurPulse.com.au  Fact Sheet for Healthcare Providers:  IncredibleEmployment.be  This test is no t yet approved or cleared by the [place] FDA and  has been authorized for detection and/or diagnosis of SARS-CoV-2 by FDA under an Emergency Use Authorization (EUA). This EUA will remain  in effect (meaning this test can be used) for the duration of the COVID-19 declaration under  Section 564(b)(1) of the Act, 21 U.S.C.section 360bbb-3(b)(1), unless the authorization is terminated  or revoked sooner.       Influenza A by PCR NEGATIVE NEGATIVE Final   Influenza B by PCR NEGATIVE NEGATIVE Final    Comment: (NOTE) The Xpert Xpress SARS-CoV-2/FLU/RSV plus assay is intended as an aid in the diagnosis of influenza from Nasopharyngeal swab specimens and should not be used as a sole basis for treatment. Nasal washings and aspirates are unacceptable for Xpert Xpress SARS-CoV-2/FLU/RSV testing.  Fact Sheet for Patients: EntrepreneurPulse.com.au  Fact Sheet for Healthcare Providers: IncredibleEmployment.be  This test is not yet approved or cleared by the [place] FDA and has been authorized for detection and/or diagnosis of SARS-CoV-2 by FDA under an Emergency Use Authorization (EUA). This EUA will remain in effect (meaning this test can be used) for the duration of the COVID-19 declaration  under Section 564(b)(1) of the Act, 21 U.S.C. section 360bbb-3(b)(1), unless the authorization is terminated or revoked.  Performed at [facility], [address] [place], [place] [address]   Blood culture (routine x 2)     Status: None   Collection Time: 01/13/21  6:20 PM   Specimen: BLOOD  Result Value Ref Range Status   Specimen Description BLOOD RIGHT ANTECUBITAL  Final   Special Requests   Final    BOTTLES DRAWN AEROBIC AND ANAEROBIC Blood Culture adequate volume   Culture   Final    NO GROWTH 5 DAYS Performed at [facility], [address] [place], [place] [address]    Report Status 01/18/2021 FINAL  Final  Blood culture (routine x 2)     Status: None   Collection Time: 01/13/21  6:20 PM   Specimen: BLOOD  Result Value Ref Range Status   Specimen Description BLOOD LEFT ANTECUBITAL  Final   Special Requests   Final    BOTTLES DRAWN AEROBIC AND ANAEROBIC Blood Culture adequate volume   Culture   Final    NO GROWTH 5 DAYS Performed at [facility], [address] [place], [place] [address]    Report Status 01/18/2021 FINAL  Final  Respiratory (~20 pathogens) panel by PCR     Status: None   Collection Time: 01/15/21  2:01 PM   Specimen: Nasopharyngeal Swab; Respiratory  Result Value Ref Range Status   Adenovirus NOT DETECTED NOT DETECTED Final   Coronavirus 229E NOT DETECTED NOT DETECTED Final    Comment: (NOTE) The Coronavirus on the Respiratory Panel, DOES NOT test for the novel  Coronavirus (2019 nCoV)    Coronavirus HKU1 NOT DETECTED NOT DETECTED Final   Coronavirus NL63 NOT DETECTED NOT DETECTED Final   Coronavirus OC43 NOT DETECTED NOT DETECTED Final   Metapneumovirus NOT DETECTED NOT DETECTED Final   Rhinovirus / Enterovirus NOT DETECTED NOT DETECTED Final   Influenza A NOT DETECTED NOT DETECTED Final   Influenza B NOT DETECTED NOT DETECTED Final   Parainfluenza Virus 1 NOT DETECTED NOT DETECTED Final   Parainfluenza Virus 2  NOT DETECTED NOT DETECTED Final   Parainfluenza Virus 3 NOT DETECTED NOT DETECTED Final   Parainfluenza Virus 4 NOT DETECTED NOT DETECTED Final   Respiratory Syncytial Virus NOT DETECTED NOT DETECTED Final   Bordetella pertussis NOT DETECTED NOT DETECTED Final   Bordetella Parapertussis NOT DETECTED NOT DETECTED Final   Chlamydophila pneumoniae NOT DETECTED NOT DETECTED Final   Mycoplasma pneumoniae NOT DETECTED NOT DETECTED Final    Comment: Performed at [facility], [place]  [address], [place], [place] [address]  MRSA Next Gen by PCR, Nasal     Status: None   Collection Time: 01/15/21  2:02 PM   Specimen: Nasal Mucosa; Nasal Swab  Result Value Ref Range Status   MRSA by PCR Next Gen NOT DETECTED NOT DETECTED Final    Comment: (NOTE) The GeneXpert MRSA Assay (FDA approved for NASAL specimens only), is one component of a comprehensive MRSA colonization surveillance program. It is not intended to diagnose MRSA infection nor to guide or monitor treatment for MRSA infections. Test performance is not FDA approved in patients less than 20 years old. Performed at [facility], [address] [place], [place] [address]   Aspergillus Ag, BAL/Serum     Status: None   Collection Time: 01/19/21  4:43 AM  Result Value Ref Range Status   Aspergillus Ag, BAL/Serum 0.02 0.00 - 0.49 Index Final    Comment: (NOTE) Performed At: [facility] [address] [place], [place] [id number] Rush Farmer MD RD:[id number]          Radiology Studies: DG Chest Port 1 View  Result Date: 01/22/2021 CLINICAL DATA:  Hypoxia, shortness of breath. EXAM: PORTABLE CHEST 1 VIEW COMPARISON:  January 20, 2021. FINDINGS: Stable cardiomediastinal silhouette. Right internal jugular Port-A-Cath is unchanged in position. Hypoinflation of the lungs is noted. Stable bilateral lung opacities are noted, left greater than right, most consistent with multifocal pneumonia. Bony thorax is unremarkable.  IMPRESSION: Hypoinflation of the lungs. Stable bilateral lung opacities are noted, left greater than right, most consistent with multifocal pneumonia. Electronically Signed   By: Marijo Conception M.D.   On: 01/22/2021 19:26        Scheduled Meds:  acyclovir  400 mg Oral BID   apixaban  5 mg Oral BID   Chlorhexidine Gluconate Cloth  6 each Topical Daily   digoxin  0.125 mg Oral Daily   diltiazem  180 mg Oral Daily   docusate sodium  200 mg Oral BID   feeding supplement (NEPRO CARB STEADY)  237 mL Oral TID BM   furosemide  20 mg Intravenous BID   guaiFENesin  1,200 mg Oral BID   insulin aspart  0-20 Units Subcutaneous TID WC   insulin aspart  0-5 Units Subcutaneous QHS   insulin aspart  15 Units Subcutaneous TID WC   insulin glargine-yfgn  25 Units Subcutaneous Daily   methylPREDNISolone (SOLU-MEDROL) injection  50 mg Intravenous Q12H   multivitamin with minerals  1 tablet Oral Daily   mycophenolate  500 mg Oral BID   sodium chloride flush  10-40 mL Intracatheter Q12H   sulfamethoxazole-trimethoprim  1 tablet Oral Daily   Continuous Infusions:     LOS: 10 days    Time spent: 34 mins     Wyvonnia Dusky, MD [facility] Pager [phone]  If 7PM-7AM, please contact night-coverage  01/23/2021, 7:40 AM

## 2021-01-23 NOTE — Progress Notes (Signed)
CBG 407/MD made aware/give insulin as ordered, no additional coverage. Continue to monitor.

## 2021-01-24 ENCOUNTER — Inpatient Hospital Stay: Payer: BC Managed Care – PPO

## 2021-01-24 ENCOUNTER — Encounter: Payer: Self-pay | Admitting: Internal Medicine

## 2021-01-24 DIAGNOSIS — I480 Paroxysmal atrial fibrillation: Secondary | ICD-10-CM | POA: Diagnosis not present

## 2021-01-24 DIAGNOSIS — J9621 Acute and chronic respiratory failure with hypoxia: Secondary | ICD-10-CM | POA: Diagnosis not present

## 2021-01-24 DIAGNOSIS — J849 Interstitial pulmonary disease, unspecified: Secondary | ICD-10-CM | POA: Diagnosis not present

## 2021-01-24 LAB — CBC
HCT: 40.9 % (ref 39.0–52.0)
Hemoglobin: 13.7 g/dL (ref 13.0–17.0)
MCH: 27.9 pg (ref 26.0–34.0)
MCHC: 33.5 g/dL (ref 30.0–36.0)
MCV: 83.3 fL (ref 80.0–100.0)
Platelets: 391 10*3/uL (ref 150–400)
RBC: 4.91 MIL/uL (ref 4.22–5.81)
RDW: 17.2 % — ABNORMAL HIGH (ref 11.5–15.5)
WBC: 19 10*3/uL — ABNORMAL HIGH (ref 4.0–10.5)
nRBC: 0 % (ref 0.0–0.2)

## 2021-01-24 LAB — GLUCOSE, CAPILLARY
Glucose-Capillary: 240 mg/dL — ABNORMAL HIGH (ref 70–99)
Glucose-Capillary: 396 mg/dL — ABNORMAL HIGH (ref 70–99)
Glucose-Capillary: 141 mg/dL — ABNORMAL HIGH (ref 70–99)
Glucose-Capillary: 133 mg/dL — ABNORMAL HIGH (ref 70–99)
Glucose-Capillary: 63 mg/dL — ABNORMAL LOW (ref 70–99)

## 2021-01-24 LAB — BASIC METABOLIC PANEL
Anion gap: 9 (ref 5–15)
BUN: 36 mg/dL — ABNORMAL HIGH (ref 8–23)
CO2: 33 mmol/L — ABNORMAL HIGH (ref 22–32)
Calcium: 8.3 mg/dL — ABNORMAL LOW (ref 8.9–10.3)
Chloride: 93 mmol/L — ABNORMAL LOW (ref 98–111)
Creatinine, Ser: 0.6 mg/dL — ABNORMAL LOW (ref 0.61–1.24)
GFR, Estimated: >60 mL/min (ref >60)
Glucose, Bld: 218 mg/dL — ABNORMAL HIGH (ref 70–99)
Potassium: 4.3 mmol/L (ref 3.5–5.1)
Sodium: 135 mmol/L (ref 135–145)

## 2021-01-24 MED ORDER — DILTIAZEM HCL 25 MG/5ML IV SOLN
10.0000 mg | Freq: Four times a day (QID) | INTRAVENOUS | Status: DC | PRN
  Administered 2021-01-24 (×2): 10 mg via INTRAVENOUS
  Filled 2021-01-24 (×2): qty 5

## 2021-01-24 MED ORDER — INSULIN GLARGINE-YFGN 100 UNIT/ML ~~LOC~~ SOLN
30.0000 [IU] | Freq: Every day | SUBCUTANEOUS | Status: DC
  Administered 2021-01-25: 30 [IU] via SUBCUTANEOUS
  Filled 2021-01-24 (×2): qty 0.3

## 2021-01-24 NOTE — Progress Notes (Signed)
Pulmonary Medicine          Date: 01/24/2021,   MRN# [id number] Edward Atkinson [birth year]     AdmissionWeight: 63.5 kg                 CurrentWeight: 71.6 kg   Referring physician: Dr Roderic Palau   CHIEF COMPLAINT:   Acute on chronic hypoxemic respiratory failure   HISTORY OF PRESENT ILLNESS   60 yo M w/hx of chronic hypoxemia and ILD post COVID19, T2DM, PE on eliquis, Lymphoma s/p Rituxan which is completed.  Came in due to worsening SOB and DOE specifically mild exertional defacation/urination with feelings of presyncope and severe dyspnea which started to get worse post tapering from steroid appx few days prior to onset of symptom progression.  He was placed on solumedrol and reported mild improvement.  He is on 10L /min [place] during my evaluation.  He had CT chest done with PE protocol and noted to have bilateral GGO infiltrates worse compared to June study. PCCM consultation for further evaluation and management.     01/17/21-  patient seems to be slighly clinically worse post reduction of IV steroids yesterday indicative of ongoing ILD exacerbation, thus far infectious workup is negative but inflammatory biomarkers are highly elevated also pointing towards ILD exacerbation.  There is new CXR in process this morning for interval changes. We discussed increasing steroids slightly and giving more time.  I will also initiate steroid sparing immunomodulation with BID cellcept as patient will likely need therapy for prolonged time period.  Compensatory tachycardia noted appreciate cardiology team.  Sugars with peaks and valleys with adjusted steroid therapy.  Patient with muscle wasting and protein calorie malnutrition but eating better this am.  01/18/21- patient is on HFNC at 60%, feels better, has not been able to tolerate BIPAP.  Wife is at bedside.  BP is borderline low MAP 76.  He has negative infectious workup and thus far ILD exacerbation seems to be main differential. He  continues to have peaks and valleys in blood glucose.  We started steroid sparing regimen with cellcept bid 250 mg and will increase dose as we taper off prednisone.  Remains tachyarrtythmic but improved. Appreciate everyone involved.     01/19/21- patient seen and examined at bedside.  Wife present during evaluation.  Ow HFNC weaned to 41%/45L/MIN.  CRP is still up, have increased cellcept and reducing solumedrol.  Patient felt stronger physically and was able to get OOB to chair. Cough is less now.  He is non-labored with breathing no accessory mm use today. He had dark stools with reduced hb, FOBT ordred.  Haptoglobin is elevated. Pathology slide review ordered.   01/20/21- patient is essentially unchanged from yesterday from pulmonary perspective. CXR also unchanged,  bloodwork with reduction in CRP.  Plan to wean O2 and continue OOB with PT/OT,  Continue current steroid dose and Cellcept.   01/21/21- patient is stable on HFNC.  He had small set back with increased O2 req after exerting himself in bathroom.  HFNC from 41/45 to 54/45.   He diuresed very well with lasix 20 bid >2L urine in past 24h.  Tachycardia is improved. He does not feel weaker and infact states he feels slowly improved.   01/22/21- I spoke to patient regarding goals of care and recommend DNR.  He is agreeable but I asked him to discuss with wife and family out of respect and we will revisit tommorow.  He states he is feeling better  and CRP is trending down but he remains on HFNC 60/40.  He was able to get OOB today and walked to bathroom but experienced mild desaturation with quick recovery "not much issues".      01/23/21- patient is still on high flow nasal canula , he is on 56%/45L.  We reviewed code status and he is still in discussion with family regarding DNR.   We discussed lung transplant per family request and this may be a future option if he is strong enough and recovers.  I have increased insluin TID to 20 from 15 and  increased bactrim to bid SS   01/24/21- patient weaned on HFNC to 49/45, he was able to use BIPAP overnight thinks its helping.  CXR this AM with interval improvement. Will keep on current dose of meds.  Repeat CRP in am.  Last BM yesterday well formed no blood, eating good, urine output very good on 20 bid lasix. Getting OOB with  transient desaturation.  Overall slight improvement.   PAST MEDICAL HISTORY   Past Medical History:  Diagnosis Date  . Chronic respiratory failure with hypoxia (HCC)    2L [place] continuous  . COVID-19   . Diabetes mellitus without complication ([place])   . Diverticulitis   . Follicular lymphoma of intra-abdominal lymph nodes ([place]) 02/15/2019  . ILD (interstitial lung disease) ([place])      SURGICAL HISTORY   Past Surgical History:  Procedure Laterality Date  . CHOLECYSTECTOMY    . COLON RESECTION     DUE TO DIVERTICULITIS  . FLEXIBLE BRONCHOSCOPY N/A 12/02/2019   Procedure: FLEXIBLE BRONCHOSCOPY;  Surgeon: Ottie Glazier, MD;  Location: [facility];  Service: Thoracic;  Laterality: N/A;  . PORTA CATH INSERTION N/A 03/11/2019   Procedure: PORTA CATH INSERTION;  Surgeon: Algernon Huxley, MD;  Location: [place] [facility];  Service: Cardiovascular;  Laterality: N/A;  . PULMONARY THROMBECTOMY N/A 01/29/2019   Procedure: PULMONARY THROMBECTOMY;  Surgeon: Algernon Huxley, MD;  Location: [place] [facility];  Service: Cardiovascular;  Laterality: N/A;     FAMILY HISTORY   Family History  Problem Relation Age of Onset  . Diabetes Brother      SOCIAL HISTORY   Social History   Tobacco Use  . Smoking status: Never  . Smokeless tobacco: Never  Vaping Use  . Vaping Use: Never used  Substance Use Topics  . Alcohol use: Yes    Alcohol/week: 0.0 - 2.0 standard drinks    Comment: occasional  . Drug use: Never     MEDICATIONS    Home Medication:    Current Medication:  Current Facility-Administered Medications:  .  acetaminophen (TYLENOL) tablet 650 mg, 650  mg, Oral, Q6H PRN, 650 mg at 01/18/21 1411 **OR** acetaminophen (TYLENOL) suppository 650 mg, 650 mg, Rectal, Q6H PRN, Howerter, Justin B, DO .  acyclovir (ZOVIRAX) 200 MG capsule 400 mg, 400 mg, Oral, BID, Memon, Jehanzeb, MD, 400 mg at 01/24/21 0900 .  apixaban (ELIQUIS) tablet 5 mg, 5 mg, Oral, BID, Beers, Shanon Brow, RPH, 5 mg at 01/24/21 0904 .  Chlorhexidine Gluconate Cloth 2 % PADS 6 each, 6 each, Topical, Daily, Kathie Dike, MD, 6 each at 01/24/21 [phone] .  digoxin (LANOXIN) tablet 0.125 mg, 0.125 mg, Oral, Daily, Memon, Jehanzeb, MD, 0.125 mg at 01/24/21 0900 .  diltiazem (CARDIZEM CD) 24 hr capsule 180 mg, 180 mg, Oral, Daily, Paraschos, Alexander, MD, 180 mg at 01/24/21 0902 .  docusate sodium (COLACE) capsule 200 mg, 200 mg,  Oral, BID, Wyvonnia Dusky, MD .  feeding supplement (NEPRO CARB STEADY) liquid 237 mL, 237 mL, Oral, TID BM, Memon, Jehanzeb, MD, Last Rate: 0 mL/hr at 01/21/21 2130, 237 mL at 01/24/21 0920 .  furosemide (LASIX) injection 20 mg, 20 mg, Intravenous, BID, Ottie Glazier, MD, 20 mg at 01/24/21 0911 .  guaiFENesin (MUCINEX) 12 hr tablet 1,200 mg, 1,200 mg, Oral, BID, Kathie Dike, MD, 1,200 mg at 01/24/21 0905 .  guaiFENesin-dextromethorphan (ROBITUSSIN DM) 100-10 MG/5ML syrup 5 mL, 5 mL, Oral, Q4H PRN, Kathie Dike, MD, 5 mL at 01/17/21 0648 .  insulin aspart (novoLOG) injection 0-20 Units, 0-20 Units, Subcutaneous, TID WC, Kathie Dike, MD, 7 Units at 01/24/21 0909 .  insulin aspart (novoLOG) injection 0-5 Units, 0-5 Units, Subcutaneous, QHS, Kathie Dike, MD, 2 Units at 01/23/21 2124 .  insulin aspart (novoLOG) injection 20 Units, 20 Units, Subcutaneous, TID WC, Ottie Glazier, MD, 20 Units at 01/24/21 0908 .  insulin glargine-yfgn (SEMGLEE) injection 25 Units, 25 Units, Subcutaneous, Daily, Wyvonnia Dusky, MD, 25 Units at 01/24/21 [phone] .  levalbuterol (XOPENEX) nebulizer solution 1.25 mg, 1.25 mg, Nebulization, Q4H PRN, Howerter, Justin B, DO,  1.25 mg at 01/17/21 2011 .  methylPREDNISolone sodium succinate (SOLU-MEDROL) 125 mg/2 mL injection 50 mg, 50 mg, Intravenous, Q12H, Lanney Gins, Geraldine Tesar, MD, 50 mg at 01/24/21 0915 .  multivitamin with minerals tablet 1 tablet, 1 tablet, Oral, Daily, Kathie Dike, MD, 1 tablet at 01/24/21 0904 .  mycophenolate (CELLCEPT) capsule 500 mg, 500 mg, Oral, BID, Ottie Glazier, MD, 500 mg at 01/24/21 0903 .  sodium chloride flush (NS) 0.9 % injection 10-40 mL, 10-40 mL, Intracatheter, Q12H, Memon, Jolaine Artist, MD, 10 mL at 01/24/21 0914 .  sodium chloride flush (NS) 0.9 % injection 10-40 mL, 10-40 mL, Intracatheter, PRN, Kathie Dike, MD, 10 mL at 01/24/21 0915 .  sulfamethoxazole-trimethoprim (BACTRIM) 400-80 MG per tablet 1 tablet, 1 tablet, Oral, Q12H, Ottie Glazier, MD, 1 tablet at 01/24/21 0901  Facility-Administered Medications Ordered in Other Encounters:  .  heparin lock flush 100 unit/mL, 500 Units, Intravenous, Once, Earlie Server, MD    ALLERGIES   Penicillins     REVIEW OF SYSTEMS    Review of Systems:  Gen:  Denies  fever, sweats, chills weigh loss  HEENT: Denies blurred vision, double vision, ear pain, eye pain, hearing loss, nose bleeds, sore throat Cardiac:  No dizziness, chest pain or heaviness, chest tightness,edema Resp:   Denies cough or sputum porduction, shortness of breath,wheezing, hemoptysis,  Gi: Denies swallowing difficulty, stomach pain, nausea or vomiting, diarrhea, constipation, bowel incontinence Gu:  Denies bladder incontinence, burning urine Ext:   Denies Joint pain, stiffness or swelling Skin: Denies  skin rash, easy bruising or bleeding or hives Endoc:  Denies polyuria, polydipsia , polyphagia or weight change Psych:   Denies depression, insomnia or hallucinations   Other:  All other systems negative   VS: BP 115/76 (BP Location: Right Arm)   Pulse (!) 120   Temp 98.4 F (36.9 C)   Resp 18   Ht 6' (1.829 m)   Wt 71.6 kg   SpO2 94%   BMI 21.41  kg/m      PHYSICAL EXAM    GENERAL:NAD, no fevers, chills, no weakness no fatigue HEAD: Normocephalic, atraumatic.  EYES: Pupils equal, round, reactive to light. Extraocular muscles intact. No scleral icterus.  MOUTH: Moist mucosal membrane. Dentition intact. No abscess noted.  EAR, NOSE, THROAT: Clear without exudates. No external lesions.  NECK: Supple. No thyromegaly.  No nodules. No JVD.  PULMONARY: Bilateral rhonchi CARDIOVASCULAR: S1 and S2. Regular rate and rhythm. No murmurs, rubs, or gallops. No edema. Pedal pulses 2+ bilaterally.  GASTROINTESTINAL: Soft, nontender, nondistended. No masses. Positive bowel sounds. No hepatosplenomegaly.  MUSCULOSKELETAL: No swelling, clubbing, or edema. Range of motion full in all extremities.  NEUROLOGIC: Cranial nerves II through XII are intact. No gross focal neurological deficits. Sensation intact. Reflexes intact.  SKIN: No ulceration, lesions, rashes, or cyanosis. Skin warm and dry. Turgor intact.  PSYCHIATRIC: Mood, affect within normal limits. The patient is awake, alert and oriented x 3. Insight, judgment intact.       IMAGING    CT Angio Chest PE W/Cm &/Or Wo Cm  Result Date: 01/13/2021 CLINICAL DATA:  Respiratory distress, hypoxia EXAM: CT ANGIOGRAPHY CHEST WITH CONTRAST TECHNIQUE: Multidetector CT imaging of the chest was performed using the standard protocol during bolus administration of intravenous contrast. Multiplanar CT image reconstructions and MIPs were obtained to evaluate the vascular anatomy. CONTRAST:  53m OMNIPAQUE IOHEXOL 350 MG/ML SOLN COMPARISON:  10/26/2020, 01/13/2021 FINDINGS: Cardiovascular: This is a technically adequate evaluation of the pulmonary vasculature. No filling defects or pulmonary emboli. The heart is unremarkable without pericardial effusion. No evidence of thoracic aortic aneurysm or dissection. Right chest wall port via internal jugular approach tip within the superior vena cava. Mediastinum/Nodes:  No pathologic adenopathy within the mediastinum, hila, or axilla. Thyroid, trachea, and esophagus are unremarkable. Lungs/Pleura: There has been interval progression of the multifocal ground-glass airspace disease seen previously. Changes are most pronounced within the dependent lower lobes. No effusion or pneumothorax. The central airways are patent, with persistent bronchiectasis. Upper Abdomen: No acute abnormality. Musculoskeletal: No acute or destructive bony lesions. Reconstructed images demonstrate no additional findings. Review of the MIP images confirms the above findings. IMPRESSION: 1. No evidence of pulmonary embolus. 2. Progressive multifocal bilateral ground-glass airspace disease, greatest at the lung bases. The differential diagnosis would include progressive postinflammatory scarring and fibrosis, multifocal atypical pneumonia, or hypersensitivity/drug toxicity. 3. Stable bronchiectasis. Electronically Signed   By: MRanda NgoM.D.   On: 01/13/2021 17:13   DG Chest Port 1 View  Result Date: 01/24/2021 CLINICAL DATA:  Follow-up multifocal pneumonia. EXAM: PORTABLE CHEST 1 VIEW COMPARISON:  01/22/2021 and older exams. FINDINGS: Mild interval decrease in the airspace lung opacities, most evident on the left. Lung volumes remain low. No new lung abnormalities. No visualized pleural effusion.  No pneumothorax. Right internal jugular Port-A-Cath is stable. IMPRESSION: 1. Mild interval improvement in lung aeration with decreased airspace lung opacities, most notably on the left. Electronically Signed   By: DLajean ManesM.D.   On: 01/24/2021 07:57   DG Chest Port 1 View  Result Date: 01/22/2021 CLINICAL DATA:  Hypoxia, shortness of breath. EXAM: PORTABLE CHEST 1 VIEW COMPARISON:  January 20, 2021. FINDINGS: Stable cardiomediastinal silhouette. Right internal jugular Port-A-Cath is unchanged in position. Hypoinflation of the lungs is noted. Stable bilateral lung opacities are noted, left  greater than right, most consistent with multifocal pneumonia. Bony thorax is unremarkable. IMPRESSION: Hypoinflation of the lungs. Stable bilateral lung opacities are noted, left greater than right, most consistent with multifocal pneumonia. Electronically Signed   By: JMarijo ConceptionM.D.   On: 01/22/2021 19:26   DG Chest Port 1 View  Result Date: 01/20/2021 CLINICAL DATA:  Pneumonitis EXAM: PORTABLE CHEST 1 VIEW COMPARISON:  01/19/2021 FINDINGS: Shallow inspiration. Patchy infiltrates in the left lung and right lung base, similar to prior study and likely multifocal pneumonia.  No pleural effusions. No pneumothorax. Mediastinal contours appear intact. Heart size is normal. Power port type central venous catheter on the right with tip over the cavoatrial junction region. Surgical clips in the right upper quadrant. IMPRESSION: Shallow inspiration. Patchy infiltrates in both lungs, likely multifocal pneumonia, without change. Electronically Signed   By: Lucienne Capers M.D.   On: 01/20/2021 03:56   DG Chest Port 1 View  Result Date: 01/19/2021 CLINICAL DATA:  Abnormality on previous chest x-ray in a 60 year old male. EXAM: PORTABLE CHEST 1 VIEW COMPARISON:  CT from January 13, 2021, chest x-ray January 18, 2021. FINDINGS: RIGHT-sided Port-A-Cath terminates at the caval to atrial junction. Trachea is midline. Cardiomediastinal contours are stable. Interstitial and airspace opacities throughout the chest worse in the LEFT mid to upper chest, RIGHT upper chest lung bases bilaterally with similar appearance. On limited assessment there is no acute skeletal process. EKG leads project over the chest. IMPRESSION: No interval change in the appearance of the chest since previous imaging with continued decreased lung volumes and patchy bilateral opacities. Electronically Signed   By: Zetta Bills M.D.   On: 01/19/2021 07:59   DG Chest Port 1 View  Result Date: 01/18/2021 CLINICAL DATA:  Respiratory distress  EXAM: PORTABLE CHEST 1 VIEW COMPARISON:  01/17/2021 FINDINGS: Cardiac shadow is stable. Right-sided chest wall port is again seen. Lungs are hypoinflated. Patchy airspace opacities again identified in both lungs left greater than right but stable from the prior exam. IMPRESSION: Patchy airspace opacities bilaterally stable from the prior exam. Electronically Signed   By: Inez Catalina M.D.   On: 01/18/2021 17:24   DG Chest Port 1 View  Result Date: 01/17/2021 CLINICAL DATA:  Worsening shortness of breath. EXAM: PORTABLE CHEST 1 VIEW COMPARISON:  01/14/2021 FINDINGS: 0826 hours. Low volume film. Patchy airspace disease again noted in both lungs, left greater than right. Cardiopericardial silhouette is at upper limits of normal for size. Right Port-A-Cath again noted. Telemetry leads overlie the chest. IMPRESSION: Low volume film with patchy bilateral airspace disease, left greater than right. No substantial change. Electronically Signed   By: Misty Stanley M.D.   On: 01/17/2021 08:53   DG Chest Port 1 View  Result Date: 01/14/2021 CLINICAL DATA:  Shortness of breath.  History of cancer.  Diabetes. EXAM: PORTABLE CHEST 1 VIEW.  Patient is rotated. COMPARISON:  Chest x-ray 01/13/2021, CT chest 01/13/2021 FINDINGS: Accessed right chest wall Port-A-Cath in stable position. The heart and mediastinal contours are within normal limits. Low lung volumes with persistent diffuse patchy airspace opacities. No pleural effusion. No pneumothorax. No acute osseous abnormality. Right upper quadrant surgical clips. IMPRESSION: Low lung volumes with persistent diffuse patchy airspace opacities. Electronically Signed   By: Iven Finn M.D.   On: 01/14/2021 21:08   DG Chest Port 1 View  Result Date: 01/13/2021 CLINICAL DATA:  Shortness of breath EXAM: PORTABLE CHEST 1 VIEW COMPARISON:  Chest radiograph 07/29/2019 FINDINGS: There is a right chest wall port with the tip terminating in the lower SVC/cavoatrial junction. The  cardiomediastinal silhouette is within normal limits. Lung volumes are low with unchanged asymmetric elevation of the right hemidiaphragm. There are increased interstitial markings with diffuse reticular opacities throughout both lungs. There is no focal consolidation. There is no significant pleural effusion. There is no pneumothorax. There is no acute osseous abnormality. There is gaseous distention of the stomach and large bowel in the right upper quadrant. IMPRESSION: Low lung volumes with suspected mild pulmonary interstitial edema. Atypical/viral infection could  have a similar appearance. Electronically Signed   By: Valetta Mole M.D.   On: 01/13/2021 14:24      ASSESSMENT/PLAN   Acute hypoxemic respiratory failure - present on admission  - COVID19 negative   - supplemental O2 during my evaluation 10l/min>>8L>>HFNC  -Respiratory viral panel-negative  -serum fungitell-negative  -legionella ab-negative  -nasal MRSA PCR-negative -Procalcitonin trend reviewed -strep pneumoniae ur AG -Histoplasma Ur Ag-negative -sputum resp cultures -AFB sputum expectorated specimen -sputum cytology  -Agree with Cefepime empirically  -Reduced Solumedrol to 40 BID - 01/15/21>>50 BID -reviewed pertinent imaging with patient today - ESR and CRP are elevated but improving -PT/OT for d/c planning  -please encourage patient to use incentive spirometer few times each hour while hospitalized.   -continue Cellcept 250 bid >>500 bid  -started lasix 20 bid -01/20/21 working well.  -UOP is adequate -Discussed goal of care - recommend DNR patient is in agreement but still discussing with wife.    Acute blood loss anemia    - noted Hb trending down from 14 to 10 -    -fecal occult blood test    - may be due to GI bleed due to eliquis - monitor for grossly visible bleeding and stable vital sings    Thank you for allowing me to participate in the care of this patient.   Patient/Family are satisfied with care  plan and all questions have been answered.   This document was prepared using Dragon voice recognition software and may include unintentional dictation errors.     Ottie Glazier, M.D.  Division of [facility]

## 2021-01-24 NOTE — Progress Notes (Addendum)
HR noted to maintain in 130's-140's. D/w MD who ordered prn diltiazem boluses. Given first dose, HR now in 110's, pt tolerated well.

## 2021-01-24 NOTE — Progress Notes (Addendum)
PROGRESS NOTE    Edward Atkinson  IZT:[id number] DOB: Aug 10, 1960 DOA: 01/13/2021 PCP: Rusty Aus, MD    Assessment & Plan:   Principal Problem:   Acute on chronic respiratory failure with hypoxia [facility]) Active Problems:   Type 2 diabetes mellitus without complication, without long-term current use of insulin (HCC)   SOB (shortness of breath)   Interstitial lung disease (HCC)   Severe sepsis ([place])   CAP (community acquired pneumonia)   Elevated troponin   Protein calorie malnutrition (HCC)   Protein-calorie malnutrition, severe   Acute on chronic hypoxic respiratory failure: slight improvement from day prior. Continue on supplemental oxygen and wean back to baseline as tolerated, still on HFNC. On 2L [place] at home chronically. CTA chest negative for PE, & possible progressive ILD vs pneumonia. Completed abx course. Continue on lasix, IV steroids, cellcept, & bronchodilators as per pulmon. Continue on bactrim, acyclovir for PCP & HSV prophylaxis  Sepsis : met criteria w/ leukocytosis, tachycardia and tachypnea & likely pneumonia. Resolved    Likely PAF: continue on digoxin, cardizem & eliquis as per cardio. Outpatient f/u in 1-2 weeks w/ Dr. Saralyn Pilar. [place] signed off   Normocytic anemia: no longer anemic, will continue to monitor    Mild elevation of troponin: likely secondary to demand ischemia   Thrombocytosis: resolved    DM2: w/ HbA1c 6.6, well controlled. Continue on glargine, SSI w/ accuchecks   Hx of pulmonary embolism: continue on eliquis    Hx of follicular cell lymphoma: management per onco an outpatient     DVT prophylaxis: eliquis  Code Status: full  Family Communication:  Disposition Plan: likely d/c back home   Level of care: Progressive Cardiac  Status is: Inpatient  Remains inpatient appropriate because:IV treatments appropriate due to intensity of illness or inability to take PO and Inpatient level of care appropriate due to severity of illness,  still requiring HFNC   Dispo: The patient is from: Home              Anticipated d/c is to: Home              Patient currently is not medically stable to d/c.   Difficult to place patient : unclear   Consultants:  Cardio Pulmon   Procedures:   Antimicrobials:   Subjective: Pt still c/o productive cough and shortness of breath   Objective: Vitals:   01/23/21 2340 01/24/21 0016 01/24/21 0449 01/24/21 0500  BP:  120/74 104/64   Pulse:  90 98   Resp:  14 16   Temp:  (!) 97.2 F (36.2 C) (!) 97.2 F (36.2 C)   TempSrc:      SpO2: 96% 99% 98%   Weight:    71.6 kg  Height:        Intake/Output Summary (Last 24 hours) at 01/24/2021 0733 Last data filed at 01/24/2021 0500 Gross per 24 hour  Intake 720 ml  Output 3425 ml  Net -2705 ml   Filed Weights   01/22/21 0500 01/23/21 0414 01/24/21 0500  Weight: 71.4 kg 71.8 kg 71.6 kg    Examination:  General exam: Appears calm   Respiratory system: course breath sounds b/l  Cardiovascular system: S1/S2+. No rubs and clicks  Gastrointestinal system: Abd is soft, NT, ND, & normal bowel sounds  Central nervous system: Alert and oriented. Moves all extremities  Psychiatry: Judgement and insight appear normal. Appropriate mood and affect    Data Reviewed: I have personally reviewed following labs  and imaging studies  CBC: Recent Labs  Lab 01/20/21 0437 01/21/21 0556 01/22/21 0624 01/23/21 0531 01/24/21 0500  WBC 11.3* 11.2* 14.7* 17.5* 19.0*  HGB 10.9* 12.4* 13.1 13.7 13.7  HCT 33.7* 37.7* 41.1 42.9 40.9  MCV 85.5 84.7 83.2 83.3 83.3  PLT 346 353 398 402* 297   Basic Metabolic Panel: Recent Labs  Lab 01/20/21 0437 01/21/21 0556 01/22/21 0624 01/23/21 0531 01/24/21 0500  NA 137 133* 135 138 135  K 4.3 4.2 4.2 4.9 4.3  CL 97* 94* 92* 95* 93*  CO2 34* 31 33* 35* 33*  GLUCOSE 250* 262* 300* 286* 218*  BUN 25* 29* 32* 34* 36*  CREATININE 0.53* 0.55* 0.56* 0.58* 0.60*  CALCIUM 8.5* 8.9 8.4* 8.6* 8.3*  PHOS  2.7  --   --   --   --    GFR: Estimated Creatinine Clearance: 97 mL/min (A) (by C-G formula based on SCr of 0.6 mg/dL (L)). Liver Function Tests: Recent Labs  Lab 01/20/21 0437  ALBUMIN 2.3*   No results for input(s): LIPASE, AMYLASE in the last 168 hours. No results for input(s): AMMONIA in the last 168 hours. Coagulation Profile: No results for input(s): INR, PROTIME in the last 168 hours. Cardiac Enzymes: No results for input(s): CKTOTAL, CKMB, CKMBINDEX, TROPONINI in the last 168 hours. BNP (last 3 results) No results for input(s): PROBNP in the last 8760 hours. HbA1C: No results for input(s): HGBA1C in the last 72 hours. CBG: Recent Labs  Lab 01/22/21 2046 01/23/21 0751 01/23/21 1212 01/23/21 1619 01/23/21 2015  GLUCAP 178* 313* 407* 173* 221*   Lipid Profile: No results for input(s): CHOL, HDL, LDLCALC, TRIG, CHOLHDL, LDLDIRECT in the last 72 hours. Thyroid Function Tests: No results for input(s): TSH, T4TOTAL, FREET4, T3FREE, THYROIDAB in the last 72 hours. Anemia Panel: No results for input(s): VITAMINB12, FOLATE, FERRITIN, TIBC, IRON, RETICCTPCT in the last 72 hours. Sepsis Labs: No results for input(s): PROCALCITON, LATICACIDVEN in the last 168 hours.   Recent Results (from the past 240 hour(s))  Respiratory (~20 pathogens) panel by PCR     Status: None   Collection Time: 01/15/21  2:01 PM   Specimen: Nasopharyngeal Swab; Respiratory  Result Value Ref Range Status   Adenovirus NOT DETECTED NOT DETECTED Final   Coronavirus 229E NOT DETECTED NOT DETECTED Final    Comment: (NOTE) The Coronavirus on the Respiratory Panel, DOES NOT test for the novel  Coronavirus (2019 nCoV)    Coronavirus HKU1 NOT DETECTED NOT DETECTED Final   Coronavirus NL63 NOT DETECTED NOT DETECTED Final   Coronavirus OC43 NOT DETECTED NOT DETECTED Final   Metapneumovirus NOT DETECTED NOT DETECTED Final   Rhinovirus / Enterovirus NOT DETECTED NOT DETECTED Final   Influenza A NOT  DETECTED NOT DETECTED Final   Influenza B NOT DETECTED NOT DETECTED Final   Parainfluenza Virus 1 NOT DETECTED NOT DETECTED Final   Parainfluenza Virus 2 NOT DETECTED NOT DETECTED Final   Parainfluenza Virus 3 NOT DETECTED NOT DETECTED Final   Parainfluenza Virus 4 NOT DETECTED NOT DETECTED Final   Respiratory Syncytial Virus NOT DETECTED NOT DETECTED Final   Bordetella pertussis NOT DETECTED NOT DETECTED Final   Bordetella Parapertussis NOT DETECTED NOT DETECTED Final   Chlamydophila pneumoniae NOT DETECTED NOT DETECTED Final   Mycoplasma pneumoniae NOT DETECTED NOT DETECTED Final    Comment: Performed at [facility], [place]. [address] [place], [place] [address]  MRSA Next Gen by PCR, Nasal     Status:  None   Collection Time: 01/15/21  2:02 PM   Specimen: Nasal Mucosa; Nasal Swab  Result Value Ref Range Status   MRSA by PCR Next Gen NOT DETECTED NOT DETECTED Final    Comment: (NOTE) The GeneXpert MRSA Assay (FDA approved for NASAL specimens only), is one component of a comprehensive MRSA colonization surveillance program. It is not intended to diagnose MRSA infection nor to guide or monitor treatment for MRSA infections. Test performance is not FDA approved in patients less than 30 years old. Performed at [facility], [address] [place], [place] [address]   Aspergillus Ag, BAL/Serum     Status: None   Collection Time: 01/19/21  4:43 AM  Result Value Ref Range Status   Aspergillus Ag, BAL/Serum 0.02 0.00 - 0.49 Index Final    Comment: (NOTE) Performed At: [facility] [address] [place], [place] [id number] Rush Farmer MD VU:[id number]          Radiology Studies: DG Chest Port 1 View  Result Date: 01/22/2021 CLINICAL DATA:  Hypoxia, shortness of breath. EXAM: PORTABLE CHEST 1 VIEW COMPARISON:  January 20, 2021. FINDINGS: Stable cardiomediastinal silhouette. Right internal jugular Port-A-Cath is unchanged in position. Hypoinflation of  the lungs is noted. Stable bilateral lung opacities are noted, left greater than right, most consistent with multifocal pneumonia. Bony thorax is unremarkable. IMPRESSION: Hypoinflation of the lungs. Stable bilateral lung opacities are noted, left greater than right, most consistent with multifocal pneumonia. Electronically Signed   By: Marijo Conception M.D.   On: 01/22/2021 19:26        Scheduled Meds:  acyclovir  400 mg Oral BID   apixaban  5 mg Oral BID   Chlorhexidine Gluconate Cloth  6 each Topical Daily   digoxin  0.125 mg Oral Daily   diltiazem  180 mg Oral Daily   docusate sodium  200 mg Oral BID   feeding supplement (NEPRO CARB STEADY)  237 mL Oral TID BM   furosemide  20 mg Intravenous BID   guaiFENesin  1,200 mg Oral BID   insulin aspart  0-20 Units Subcutaneous TID WC   insulin aspart  0-5 Units Subcutaneous QHS   insulin aspart  20 Units Subcutaneous TID WC   insulin glargine-yfgn  25 Units Subcutaneous Daily   methylPREDNISolone (SOLU-MEDROL) injection  50 mg Intravenous Q12H   multivitamin with minerals  1 tablet Oral Daily   mycophenolate  500 mg Oral BID   sodium chloride flush  10-40 mL Intracatheter Q12H   sulfamethoxazole-trimethoprim  1 tablet Oral Q12H   Continuous Infusions:     LOS: 11 days    Time spent: 30 mins     Wyvonnia Dusky, MD [facility] Pager [phone]  If 7PM-7AM, please contact night-coverage  01/24/2021, 7:33 AM

## 2021-01-25 DIAGNOSIS — I480 Paroxysmal atrial fibrillation: Secondary | ICD-10-CM | POA: Diagnosis not present

## 2021-01-25 DIAGNOSIS — J849 Interstitial pulmonary disease, unspecified: Secondary | ICD-10-CM | POA: Diagnosis not present

## 2021-01-25 DIAGNOSIS — J9621 Acute and chronic respiratory failure with hypoxia: Secondary | ICD-10-CM | POA: Diagnosis not present

## 2021-01-25 LAB — CBC
HCT: 41.8 % (ref 39.0–52.0)
Hemoglobin: 13.5 g/dL (ref 13.0–17.0)
MCH: 26.7 pg (ref 26.0–34.0)
MCHC: 32.3 g/dL (ref 30.0–36.0)
MCV: 82.6 fL (ref 80.0–100.0)
Platelets: 398 10*3/uL (ref 150–400)
RBC: 5.06 MIL/uL (ref 4.22–5.81)
RDW: 17.7 % — ABNORMAL HIGH (ref 11.5–15.5)
WBC: 21.8 10*3/uL — ABNORMAL HIGH (ref 4.0–10.5)
nRBC: 0 % (ref 0.0–0.2)

## 2021-01-25 LAB — GLUCOSE, CAPILLARY
Glucose-Capillary: 340 mg/dL — ABNORMAL HIGH (ref 70–99)
Glucose-Capillary: 391 mg/dL — ABNORMAL HIGH (ref 70–99)
Glucose-Capillary: 112 mg/dL — ABNORMAL HIGH (ref 70–99)
Glucose-Capillary: 48 mg/dL — ABNORMAL LOW (ref 70–99)
Glucose-Capillary: 257 mg/dL — ABNORMAL HIGH (ref 70–99)

## 2021-01-25 LAB — BASIC METABOLIC PANEL
Anion gap: 8 (ref 5–15)
BUN: 40 mg/dL — ABNORMAL HIGH (ref 8–23)
CO2: 34 mmol/L — ABNORMAL HIGH (ref 22–32)
Calcium: 8.3 mg/dL — ABNORMAL LOW (ref 8.9–10.3)
Chloride: 93 mmol/L — ABNORMAL LOW (ref 98–111)
Creatinine, Ser: 0.81 mg/dL (ref 0.61–1.24)
GFR, Estimated: >60 mL/min (ref >60)
Glucose, Bld: 331 mg/dL — ABNORMAL HIGH (ref 70–99)
Potassium: 4.5 mmol/L (ref 3.5–5.1)
Sodium: 135 mmol/L (ref 135–145)

## 2021-01-25 LAB — C-REACTIVE PROTEIN: CRP: <0.5 mg/dL (ref <1.0)

## 2021-01-25 MED ORDER — METHYLPREDNISOLONE SODIUM SUCC 40 MG IJ SOLR
40.0000 mg | Freq: Two times a day (BID) | INTRAMUSCULAR | Status: DC
  Administered 2021-01-25 – 2021-01-27 (×4): 40 mg via INTRAVENOUS
  Filled 2021-01-25 (×4): qty 1

## 2021-01-25 MED ORDER — DIPHENHYDRAMINE HCL 25 MG PO CAPS
25.0000 mg | ORAL_CAPSULE | Freq: Every day | ORAL | Status: DC
  Administered 2021-01-25 – 2021-01-29 (×5): 25 mg via ORAL
  Filled 2021-01-25 (×5): qty 1

## 2021-01-25 MED ORDER — INSULIN GLARGINE-YFGN 100 UNIT/ML ~~LOC~~ SOLN
25.0000 [IU] | Freq: Every day | SUBCUTANEOUS | Status: DC
  Administered 2021-01-26: 25 [IU] via SUBCUTANEOUS
  Filled 2021-01-25 (×2): qty 0.25

## 2021-01-25 NOTE — Progress Notes (Signed)
Pulmonary Medicine          Date: 01/25/2021,   MRN# [id number] KAIZEN IBSEN 1960/08/22     AdmissionWeight: 63.5 kg                 CurrentWeight: 70.7 kg   Referring physician: Dr Roderic Palau   CHIEF COMPLAINT:   Acute on chronic hypoxemic respiratory failure   HISTORY OF PRESENT ILLNESS   60 yo M w/hx of chronic hypoxemia and ILD post COVID19, T2DM, PE on eliquis, Lymphoma s/p Rituxan which is completed.  Came in due to worsening SOB and DOE specifically mild exertional defacation/urination with feelings of presyncope and severe dyspnea which started to get worse post tapering from steroid appx few days prior to onset of symptom progression.  He was placed on solumedrol and reported mild improvement.  He is on 10L /min [place] during my evaluation.  He had CT chest done with PE protocol and noted to have bilateral GGO infiltrates worse compared to June study. PCCM consultation for further evaluation and management.     01/17/21-  patient seems to be slighly clinically worse post reduction of IV steroids yesterday indicative of ongoing ILD exacerbation, thus far infectious workup is negative but inflammatory biomarkers are highly elevated also pointing towards ILD exacerbation.  There is new CXR in process this morning for interval changes. We discussed increasing steroids slightly and giving more time.  I will also initiate steroid sparing immunomodulation with BID cellcept as patient will likely need therapy for prolonged time period.  Compensatory tachycardia noted appreciate cardiology team.  Sugars with peaks and valleys with adjusted steroid therapy.  Patient with muscle wasting and protein calorie malnutrition but eating better this am.  01/18/21- patient is on HFNC at 60%, feels better, has not been able to tolerate BIPAP.  Wife is at bedside.  BP is borderline low MAP 76.  He has negative infectious workup and thus far ILD exacerbation seems to be main differential. He  continues to have peaks and valleys in blood glucose.  We started steroid sparing regimen with cellcept bid 250 mg and will increase dose as we taper off prednisone.  Remains tachyarrtythmic but improved. Appreciate everyone involved.     01/19/21- patient seen and examined at bedside.  Wife present during evaluation.  Ow HFNC weaned to 41%/45L/MIN.  CRP is still up, have increased cellcept and reducing solumedrol.  Patient felt stronger physically and was able to get OOB to chair. Cough is less now.  He is non-labored with breathing no accessory mm use today. He had dark stools with reduced hb, FOBT ordred.  Haptoglobin is elevated. Pathology slide review ordered.   01/20/21- patient is essentially unchanged from yesterday from pulmonary perspective. CXR also unchanged,  bloodwork with reduction in CRP.  Plan to wean O2 and continue OOB with PT/OT,  Continue current steroid dose and Cellcept.   01/21/21- patient is stable on HFNC.  He had small set back with increased O2 req after exerting himself in bathroom.  HFNC from 41/45 to 54/45.   He diuresed very well with lasix 20 bid >2L urine in past 24h.  Tachycardia is improved. He does not feel weaker and infact states he feels slowly improved.   01/22/21- I spoke to patient regarding goals of care and recommend DNR.  He is agreeable but I asked him to discuss with wife and family out of respect and we will revisit tommorow.  He states he is feeling better  and CRP is trending down but he remains on HFNC 60/40.  He was able to get OOB today and walked to bathroom but experienced mild desaturation with quick recovery "not much issues".      01/23/21- patient is still on high flow nasal canula , he is on 56%/45L.  We reviewed code status and he is still in discussion with family regarding DNR.   We discussed lung transplant per family request and this may be a future option if he is strong enough and recovers.  I have increased insluin TID to 20 from 15 and  increased bactrim to bid SS   01/24/21- patient weaned on HFNC to 49/45, he was able to use BIPAP overnight thinks its helping.  CXR this AM with interval improvement. Will keep on current dose of meds.  Repeat CRP in am.  Last BM yesterday well formed no blood, eating good, urine output very good on 20 bid lasix. Getting OOB with  transient desaturation.  Overall slight improvement.    01/25/21- patient has weaned down to 35%/40L/min.  He remains full code. Reviewed medical plan with Dr Jimmye Norman today.  Reduced steroids from 50 BID to 40 BID and also reduced insulin slightly.    PAST MEDICAL HISTORY   Past Medical History:  Diagnosis Date  . Chronic respiratory failure with hypoxia (HCC)    2L [place] continuous  . COVID-19   . Diabetes mellitus without complication ([place])   . Diverticulitis   . Follicular lymphoma of intra-abdominal lymph nodes ([place]) 02/15/2019  . ILD (interstitial lung disease) ([place])      SURGICAL HISTORY   Past Surgical History:  Procedure Laterality Date  . CHOLECYSTECTOMY    . COLON RESECTION     DUE TO DIVERTICULITIS  . FLEXIBLE BRONCHOSCOPY N/A 12/02/2019   Procedure: FLEXIBLE BRONCHOSCOPY;  Surgeon: Ottie Glazier, MD;  Location: [facility];  Service: Thoracic;  Laterality: N/A;  . PORTA CATH INSERTION N/A 03/11/2019   Procedure: PORTA CATH INSERTION;  Surgeon: Algernon Huxley, MD;  Location: [facility];  Service: Cardiovascular;  Laterality: N/A;  . PULMONARY THROMBECTOMY N/A 01/29/2019   Procedure: PULMONARY THROMBECTOMY;  Surgeon: Algernon Huxley, MD;  Location: [facility];  Service: Cardiovascular;  Laterality: N/A;     FAMILY HISTORY   Family History  Problem Relation Age of Onset  . Diabetes Brother      SOCIAL HISTORY   Social History   Tobacco Use  . Smoking status: Never  . Smokeless tobacco: Never  Vaping Use  . Vaping Use: Never used  Substance Use Topics  . Alcohol use: Yes    Alcohol/week: 0.0 - 2.0 standard drinks     Comment: occasional  . Drug use: Never     MEDICATIONS    Home Medication:    Current Medication:  Current Facility-Administered Medications:  .  acetaminophen (TYLENOL) tablet 650 mg, 650 mg, Oral, Q6H PRN, 650 mg at 01/18/21 1411 **OR** acetaminophen (TYLENOL) suppository 650 mg, 650 mg, Rectal, Q6H PRN, Howerter, Justin B, DO .  acyclovir (ZOVIRAX) 200 MG capsule 400 mg, 400 mg, Oral, BID, Kathie Dike, MD, 400 mg at 01/25/21 0932 .  apixaban (ELIQUIS) tablet 5 mg, 5 mg, Oral, BID, Beers, Shanon Brow, RPH, 5 mg at 01/25/21 0932 .  Chlorhexidine Gluconate Cloth 2 % PADS 6 each, 6 each, Topical, Daily, Kathie Dike, MD, 6 each at 01/25/21 1145 .  digoxin (LANOXIN) tablet 0.125 mg, 0.125 mg, Oral, Daily, Kathie Dike, MD,  0.125 mg at 01/25/21 0932 .  diltiazem (CARDIZEM CD) 24 hr capsule 180 mg, 180 mg, Oral, Daily, Paraschos, Alexander, MD, 180 mg at 01/25/21 0932 .  diltiazem (CARDIZEM) injection 10 mg, 10 mg, Intravenous, Q6H PRN, Wyvonnia Dusky, MD, 10 mg at 01/24/21 2328 .  diphenhydrAMINE (BENADRYL) capsule 25 mg, 25 mg, Oral, QHS, Wyvonnia Dusky, MD .  docusate sodium (COLACE) capsule 200 mg, 200 mg, Oral, BID, Wyvonnia Dusky, MD, 200 mg at 01/25/21 0932 .  feeding supplement (NEPRO CARB STEADY) liquid 237 mL, 237 mL, Oral, TID BM, Memon, Jehanzeb, MD, Last Rate: 0 mL/hr at 01/21/21 2130, 237 mL at 01/25/21 0933 .  furosemide (LASIX) injection 20 mg, 20 mg, Intravenous, BID, Ottie Glazier, MD, 20 mg at 01/25/21 0835 .  guaiFENesin (MUCINEX) 12 hr tablet 1,200 mg, 1,200 mg, Oral, BID, Kathie Dike, MD, 1,200 mg at 01/25/21 0932 .  guaiFENesin-dextromethorphan (ROBITUSSIN DM) 100-10 MG/5ML syrup 5 mL, 5 mL, Oral, Q4H PRN, Kathie Dike, MD, 5 mL at 01/17/21 0648 .  insulin aspart (novoLOG) injection 0-20 Units, 0-20 Units, Subcutaneous, TID WC, Kathie Dike, MD, 20 Units at 01/25/21 1226 .  insulin aspart (novoLOG) injection 0-5 Units, 0-5 Units,  Subcutaneous, QHS, Kathie Dike, MD, 2 Units at 01/23/21 2124 .  insulin aspart (novoLOG) injection 20 Units, 20 Units, Subcutaneous, TID WC, Ottie Glazier, MD, 20 Units at 01/25/21 1227 .  insulin glargine-yfgn (SEMGLEE) injection 30 Units, 30 Units, Subcutaneous, Daily, Ottie Glazier, MD, 30 Units at 01/25/21 0932 .  levalbuterol (XOPENEX) nebulizer solution 1.25 mg, 1.25 mg, Nebulization, Q4H PRN, Howerter, Justin B, DO, 1.25 mg at 01/17/21 2011 .  methylPREDNISolone sodium succinate (SOLU-MEDROL) 125 mg/2 mL injection 50 mg, 50 mg, Intravenous, Q12H, Lanney Gins, Reece Fehnel, MD, 50 mg at 01/25/21 0932 .  multivitamin with minerals tablet 1 tablet, 1 tablet, Oral, Daily, Kathie Dike, MD, 1 tablet at 01/25/21 0932 .  mycophenolate (CELLCEPT) capsule 500 mg, 500 mg, Oral, BID, Lanney Gins, Diamone Whistler, MD, 500 mg at 01/25/21 0932 .  sodium chloride flush (NS) 0.9 % injection 10-40 mL, 10-40 mL, Intracatheter, Q12H, Memon, Jolaine Artist, MD, 20 mL at 01/25/21 0932 .  sodium chloride flush (NS) 0.9 % injection 10-40 mL, 10-40 mL, Intracatheter, PRN, Kathie Dike, MD, 10 mL at 01/24/21 0915 .  sulfamethoxazole-trimethoprim (BACTRIM) 400-80 MG per tablet 1 tablet, 1 tablet, Oral, Q12H, Ottie Glazier, MD, 1 tablet at 01/25/21 0932  Facility-Administered Medications Ordered in Other Encounters:  .  heparin lock flush 100 unit/mL, 500 Units, Intravenous, Once, Earlie Server, MD    ALLERGIES   Penicillins     REVIEW OF SYSTEMS    Review of Systems:  Gen:  Denies  fever, sweats, chills weigh loss  HEENT: Denies blurred vision, double vision, ear pain, eye pain, hearing loss, nose bleeds, sore throat Cardiac:  No dizziness, chest pain or heaviness, chest tightness,edema Resp:   Denies cough or sputum porduction, shortness of breath,wheezing, hemoptysis,  Gi: Denies swallowing difficulty, stomach pain, nausea or vomiting, diarrhea, constipation, bowel incontinence Gu:  Denies bladder incontinence,  burning urine Ext:   Denies Joint pain, stiffness or swelling Skin: Denies  skin rash, easy bruising or bleeding or hives Endoc:  Denies polyuria, polydipsia , polyphagia or weight change Psych:   Denies depression, insomnia or hallucinations   Other:  All other systems negative   VS: BP 92/70 (BP Location: Right Arm)   Pulse (!) 103   Temp 98.8 F (37.1 C)   Resp 17   Ht  6' (1.829 m)   Wt 70.7 kg   SpO2 90%   BMI 21.14 kg/m      PHYSICAL EXAM    GENERAL:NAD, no fevers, chills, no weakness no fatigue HEAD: Normocephalic, atraumatic.  EYES: Pupils equal, round, reactive to light. Extraocular muscles intact. No scleral icterus.  MOUTH: Moist mucosal membrane. Dentition intact. No abscess noted.  EAR, NOSE, THROAT: Clear without exudates. No external lesions.  NECK: Supple. No thyromegaly. No nodules. No JVD.  PULMONARY: Bilateral rhonchi CARDIOVASCULAR: S1 and S2. Regular rate and rhythm. No murmurs, rubs, or gallops. No edema. Pedal pulses 2+ bilaterally.  GASTROINTESTINAL: Soft, nontender, nondistended. No masses. Positive bowel sounds. No hepatosplenomegaly.  MUSCULOSKELETAL: No swelling, clubbing, or edema. Range of motion full in all extremities.  NEUROLOGIC: Cranial nerves II through XII are intact. No gross focal neurological deficits. Sensation intact. Reflexes intact.  SKIN: No ulceration, lesions, rashes, or cyanosis. Skin warm and dry. Turgor intact.  PSYCHIATRIC: Mood, affect within normal limits. The patient is awake, alert and oriented x 3. Insight, judgment intact.       IMAGING    CT Angio Chest PE W/Cm &/Or Wo Cm  Result Date: 01/13/2021 CLINICAL DATA:  Respiratory distress, hypoxia EXAM: CT ANGIOGRAPHY CHEST WITH CONTRAST TECHNIQUE: Multidetector CT imaging of the chest was performed using the standard protocol during bolus administration of intravenous contrast. Multiplanar CT image reconstructions and MIPs were obtained to evaluate the vascular  anatomy. CONTRAST:  10m OMNIPAQUE IOHEXOL 350 MG/ML SOLN COMPARISON:  10/26/2020, 01/13/2021 FINDINGS: Cardiovascular: This is a technically adequate evaluation of the pulmonary vasculature. No filling defects or pulmonary emboli. The heart is unremarkable without pericardial effusion. No evidence of thoracic aortic aneurysm or dissection. Right chest wall port via internal jugular approach tip within the superior vena cava. Mediastinum/Nodes: No pathologic adenopathy within the mediastinum, hila, or axilla. Thyroid, trachea, and esophagus are unremarkable. Lungs/Pleura: There has been interval progression of the multifocal ground-glass airspace disease seen previously. Changes are most pronounced within the dependent lower lobes. No effusion or pneumothorax. The central airways are patent, with persistent bronchiectasis. Upper Abdomen: No acute abnormality. Musculoskeletal: No acute or destructive bony lesions. Reconstructed images demonstrate no additional findings. Review of the MIP images confirms the above findings. IMPRESSION: 1. No evidence of pulmonary embolus. 2. Progressive multifocal bilateral ground-glass airspace disease, greatest at the lung bases. The differential diagnosis would include progressive postinflammatory scarring and fibrosis, multifocal atypical pneumonia, or hypersensitivity/drug toxicity. 3. Stable bronchiectasis. Electronically Signed   By: MRanda NgoM.D.   On: 01/13/2021 17:13   DG Chest Port 1 View  Result Date: 01/24/2021 CLINICAL DATA:  Follow-up multifocal pneumonia. EXAM: PORTABLE CHEST 1 VIEW COMPARISON:  01/22/2021 and older exams. FINDINGS: Mild interval decrease in the airspace lung opacities, most evident on the left. Lung volumes remain low. No new lung abnormalities. No visualized pleural effusion.  No pneumothorax. Right internal jugular Port-A-Cath is stable. IMPRESSION: 1. Mild interval improvement in lung aeration with decreased airspace lung opacities, most  notably on the left. Electronically Signed   By: DLajean ManesM.D.   On: 01/24/2021 07:57   DG Chest Port 1 View  Result Date: 01/22/2021 CLINICAL DATA:  Hypoxia, shortness of breath. EXAM: PORTABLE CHEST 1 VIEW COMPARISON:  January 20, 2021. FINDINGS: Stable cardiomediastinal silhouette. Right internal jugular Port-A-Cath is unchanged in position. Hypoinflation of the lungs is noted. Stable bilateral lung opacities are noted, left greater than right, most consistent with multifocal pneumonia. Bony thorax is unremarkable. IMPRESSION: Hypoinflation  of the lungs. Stable bilateral lung opacities are noted, left greater than right, most consistent with multifocal pneumonia. Electronically Signed   By: Marijo Conception M.D.   On: 01/22/2021 19:26   DG Chest Port 1 View  Result Date: 01/20/2021 CLINICAL DATA:  Pneumonitis EXAM: PORTABLE CHEST 1 VIEW COMPARISON:  01/19/2021 FINDINGS: Shallow inspiration. Patchy infiltrates in the left lung and right lung base, similar to prior study and likely multifocal pneumonia. No pleural effusions. No pneumothorax. Mediastinal contours appear intact. Heart size is normal. Power port type central venous catheter on the right with tip over the cavoatrial junction region. Surgical clips in the right upper quadrant. IMPRESSION: Shallow inspiration. Patchy infiltrates in both lungs, likely multifocal pneumonia, without change. Electronically Signed   By: Lucienne Capers M.D.   On: 01/20/2021 03:56   DG Chest Port 1 View  Result Date: 01/19/2021 CLINICAL DATA:  Abnormality on previous chest x-ray in a 60 year old male. EXAM: PORTABLE CHEST 1 VIEW COMPARISON:  CT from January 13, 2021, chest x-ray January 18, 2021. FINDINGS: RIGHT-sided Port-A-Cath terminates at the caval to atrial junction. Trachea is midline. Cardiomediastinal contours are stable. Interstitial and airspace opacities throughout the chest worse in the LEFT mid to upper chest, RIGHT upper chest lung bases  bilaterally with similar appearance. On limited assessment there is no acute skeletal process. EKG leads project over the chest. IMPRESSION: No interval change in the appearance of the chest since previous imaging with continued decreased lung volumes and patchy bilateral opacities. Electronically Signed   By: Zetta Bills M.D.   On: 01/19/2021 07:59   DG Chest Port 1 View  Result Date: 01/18/2021 CLINICAL DATA:  Respiratory distress EXAM: PORTABLE CHEST 1 VIEW COMPARISON:  01/17/2021 FINDINGS: Cardiac shadow is stable. Right-sided chest wall port is again seen. Lungs are hypoinflated. Patchy airspace opacities again identified in both lungs left greater than right but stable from the prior exam. IMPRESSION: Patchy airspace opacities bilaterally stable from the prior exam. Electronically Signed   By: Inez Catalina M.D.   On: 01/18/2021 17:24   DG Chest Port 1 View  Result Date: 01/17/2021 CLINICAL DATA:  Worsening shortness of breath. EXAM: PORTABLE CHEST 1 VIEW COMPARISON:  01/14/2021 FINDINGS: 0826 hours. Low volume film. Patchy airspace disease again noted in both lungs, left greater than right. Cardiopericardial silhouette is at upper limits of normal for size. Right Port-A-Cath again noted. Telemetry leads overlie the chest. IMPRESSION: Low volume film with patchy bilateral airspace disease, left greater than right. No substantial change. Electronically Signed   By: Misty Stanley M.D.   On: 01/17/2021 08:53   DG Chest Port 1 View  Result Date: 01/14/2021 CLINICAL DATA:  Shortness of breath.  History of cancer.  Diabetes. EXAM: PORTABLE CHEST 1 VIEW.  Patient is rotated. COMPARISON:  Chest x-ray 01/13/2021, CT chest 01/13/2021 FINDINGS: Accessed right chest wall Port-A-Cath in stable position. The heart and mediastinal contours are within normal limits. Low lung volumes with persistent diffuse patchy airspace opacities. No pleural effusion. No pneumothorax. No acute osseous abnormality. Right upper  quadrant surgical clips. IMPRESSION: Low lung volumes with persistent diffuse patchy airspace opacities. Electronically Signed   By: Iven Finn M.D.   On: 01/14/2021 21:08   DG Chest Port 1 View  Result Date: 01/13/2021 CLINICAL DATA:  Shortness of breath EXAM: PORTABLE CHEST 1 VIEW COMPARISON:  Chest radiograph 07/29/2019 FINDINGS: There is a right chest wall port with the tip terminating in the lower SVC/cavoatrial junction. The cardiomediastinal silhouette  is within normal limits. Lung volumes are low with unchanged asymmetric elevation of the right hemidiaphragm. There are increased interstitial markings with diffuse reticular opacities throughout both lungs. There is no focal consolidation. There is no significant pleural effusion. There is no pneumothorax. There is no acute osseous abnormality. There is gaseous distention of the stomach and large bowel in the right upper quadrant. IMPRESSION: Low lung volumes with suspected mild pulmonary interstitial edema. Atypical/viral infection could have a similar appearance. Electronically Signed   By: Valetta Mole M.D.   On: 01/13/2021 14:24      ASSESSMENT/PLAN   Acute hypoxemic respiratory failure - present on admission  - COVID19 negative   - supplemental O2 during my evaluation 10l/min>>8L>>HFNC  -Respiratory viral panel-negative  -serum fungitell-negative  -legionella ab-negative  -nasal MRSA PCR-negative -Procalcitonin trend reviewed -strep pneumoniae ur AG -Histoplasma Ur Ag-negative -sputum resp cultures -AFB sputum expectorated specimen -sputum cytology  -Agree with Cefepime empirically  -Reduced Solumedrol to 40 BID - 01/15/21>>50 BID>>40 BID -reviewed pertinent imaging with patient today - ESR and CRP are elevated but improving -PT/OT for d/c planning  -please encourage patient to use incentive spirometer few times each hour while hospitalized.   -continue Cellcept 250 bid >>500 bid  -started lasix 20 bid -01/20/21 working  well.  -UOP is adequate -Discussed goal of care - recommend DNR patient is in agreement but still discussing with wife.    Acute blood loss anemia    - noted Hb trending down from 14 to 10 -    -fecal occult blood test    - may be due to GI bleed due to eliquis - monitor for grossly visible bleeding and stable vital sings    Thank you for allowing me to participate in the care of this patient.   Patient/Family are satisfied with care plan and all questions have been answered.   This document was prepared using Dragon voice recognition software and may include unintentional dictation errors.     Ottie Glazier, M.D.  Division of [place]

## 2021-01-25 NOTE — Progress Notes (Signed)
Inpatient Diabetes Program Recommendations  AACE/ADA: New Consensus Statement on Inpatient Glycemic Control (2015)  Target Ranges:  Prepandial:   less than 140 mg/dL      Peak postprandial:   less than 180 mg/dL (1-2 hours)      Critically ill patients:  140 - 180 mg/dL   Results for ANUP, BRIGHAM" (MRN [id number]) as of 01/25/2021 14:06  Ref. Range 01/25/2021 07:39 01/25/2021 11:14  Glucose-Capillary Latest Ref Range: 70 - 99 mg/dL 340 (H)  35 units Novolog  30 units Semglee  391 (H)  40 units Novolog    Home DM Meds: Amaryl 2 mg daily     Metformin 500 mg TID   Current Orders: Semglee 30 units Daily                            Novolog Resistant Correction Scale/ SSI (0-20 units) TID AC + HS                            Novolog 20 units TID with meals       MD- Note Solumedrol remains at 50 mg BID.  CBG's >300 today.   Please consider the following while pt remains on IV Solumedrol:   1. Increase Semglee to 35 units Daily Please give an extra 5 units Semglee today as well sicne 30 unit dose already given this AM   2. Increase Novolog Meal Coverage to 22 units TID with meals    --Will follow patient during hospitalization--  Wyn Quaker RN, MSN, CDE Diabetes Coordinator Inpatient Glycemic Control Team Team Pager: [phone] (8a-5p)

## 2021-01-25 NOTE — Progress Notes (Signed)
PT Cancellation Note  Patient Details Name: Edward Atkinson MRN: [id number] DOB: [birth year]   Cancelled Treatment:    Reason Eval/Treat Not Completed: Medical issues which prohibited therapy: Pt's BG 340 and trending up falling outside guidelines for participation with PT services.  Will attempt to see pt at a future date/time as medically appropriate.      Linus Salmons PT, DPT 01/25/21, 8:32 AM

## 2021-01-25 NOTE — Progress Notes (Addendum)
Hypoglycemic Event  CBG: 48  Treatment: 8 oz juice/soda  Symptoms: Shaky and clammy  Follow-up CBG: Time:1720 CBG Result:112  Possible Reasons for Event: Medication regimen:    Comments/MD notified: Hal Morales

## 2021-01-25 NOTE — Progress Notes (Signed)
PROGRESS NOTE    Edward Atkinson  QBV:[id number] DOB: [birth year] DOA: 01/13/2021 PCP: Rusty Aus, MD    Assessment & Plan:   Principal Problem:   Acute on chronic respiratory failure with hypoxia [facility]) Active Problems:   Type 2 diabetes mellitus without complication, without long-term current use of insulin (HCC)   SOB (shortness of breath)   Interstitial lung disease (HCC)   Severe sepsis ([place])   CAP (community acquired pneumonia)   Elevated troponin   Protein calorie malnutrition (HCC)   Protein-calorie malnutrition, severe   Acute on chronic hypoxic respiratory failure: slight improvement from day prior. Continue on supplemental oxygen and wean back to baseline as tolerated, still on HFNC but slightly reduced from day prior. CTA chest negative for PE, & possible progressive ILD vs pneumonia. Completed abx course. Continue on lasix, IV steroids, cellcept, & bronchodilators as per pulmon. Continue on bactrim, acyclovir for PCP & HSV prophylaxis. Pulmon following and recs apprec   Sepsis : met criteria w/ leukocytosis, tachycardia and tachypnea & likely pneumonia. Resolved    Likely PAF: continue on cardizem, digoxin & eliquis as per cardio. IV cardizem prn. Outpatient f/u in 1-2 weeks w/ Dr. Saralyn Pilar. Cardio signed off   Normocytic anemia: no longer anemic, will continue to monitor    Mild elevation of troponin: likely secondary to demand ischemia   Thrombocytosis: resolved    DM2: w/ HbA1c 6.6, well controlled. Continue on glargine, SSI w/ accuchecks   Hx of pulmonary embolism: continue on eliquis    Hx of follicular cell lymphoma: management per onco as an outpatient     DVT prophylaxis: eliquis  Code Status: full  Family Communication:  Disposition Plan: likely d/c back home   Level of care: Progressive Cardiac  Status is: Inpatient  Remains inpatient appropriate because:IV treatments appropriate due to intensity of illness or inability to take PO and  Inpatient level of care appropriate due to severity of illness, still requiring HFNC   Dispo: The patient is from: Home              Anticipated d/c is to: Home              Patient currently is not medically stable to d/c.   Difficult to place patient : unclear   Consultants:  Cardio Pulmon   Procedures:   Antimicrobials:   Subjective: Pt still c/o shortness of breath and not sleeping well w/ BiPAP  Objective: Vitals:   01/25/21 0400 01/25/21 0429 01/25/21 0538 01/25/21 0741  BP: 107/66   111/73  Pulse: 83   65  Resp: (!) 26   17  Temp: 97.9 F (36.6 C)   98.8 F (37.1 C)  TempSrc: Axillary     SpO2: 98%   95%  Weight:  71.6 kg 70.7 kg   Height:        Intake/Output Summary (Last 24 hours) at 01/25/2021 0748 Last data filed at 01/25/2021 0400 Gross per 24 hour  Intake 1674 ml  Output 3020 ml  Net -1346 ml   Filed Weights   01/24/21 0500 01/25/21 0429 01/25/21 0538  Weight: 71.6 kg 71.6 kg 70.7 kg    Examination:  General exam: Appears calm but uncomfortable   Respiratory system: course breath sounds b/l  Cardiovascular system: S1 & S2+. No rubs or clicks  Gastrointestinal system: Abd is soft, NT, ND & normal bowel sounds  Central nervous system: Alert and oriented. Moves all extremities  Psychiatry: Judgement and insight appear  normal. Appropriate mood and affect    Data Reviewed: I have personally reviewed following labs and imaging studies  CBC: Recent Labs  Lab 01/21/21 0556 01/22/21 0624 01/23/21 0531 01/24/21 0500 01/25/21 0615  WBC 11.2* 14.7* 17.5* 19.0* 21.8*  HGB 12.4* 13.1 13.7 13.7 13.5  HCT 37.7* 41.1 42.9 40.9 41.8  MCV 84.7 83.2 83.3 83.3 82.6  PLT 353 398 402* 391 761   Basic Metabolic Panel: Recent Labs  Lab 01/20/21 0437 01/21/21 0556 01/22/21 0624 01/23/21 0531 01/24/21 0500 01/25/21 0615  NA 137 133* 135 138 135 135  K 4.3 4.2 4.2 4.9 4.3 4.5  CL 97* 94* 92* 95* 93* 93*  CO2 34* 31 33* 35* 33* 34*  GLUCOSE 250*  262* 300* 286* 218* 331*  BUN 25* 29* 32* 34* 36* 40*  CREATININE 0.53* 0.55* 0.56* 0.58* 0.60* 0.81  CALCIUM 8.5* 8.9 8.4* 8.6* 8.3* 8.3*  PHOS 2.7  --   --   --   --   --    GFR: Estimated Creatinine Clearance: 94.6 mL/min (by C-G formula based on SCr of 0.81 mg/dL). Liver Function Tests: Recent Labs  Lab 01/20/21 0437  ALBUMIN 2.3*   No results for input(s): LIPASE, AMYLASE in the last 168 hours. No results for input(s): AMMONIA in the last 168 hours. Coagulation Profile: No results for input(s): INR, PROTIME in the last 168 hours. Cardiac Enzymes: No results for input(s): CKTOTAL, CKMB, CKMBINDEX, TROPONINI in the last 168 hours. BNP (last 3 results) No results for input(s): PROBNP in the last 8760 hours. HbA1C: No results for input(s): HGBA1C in the last 72 hours. CBG: Recent Labs  Lab 01/24/21 1138 01/24/21 1622 01/24/21 2053 01/24/21 2127 01/25/21 0739  GLUCAP 396* 141* 63* 133* 340*   Lipid Profile: No results for input(s): CHOL, HDL, LDLCALC, TRIG, CHOLHDL, LDLDIRECT in the last 72 hours. Thyroid Function Tests: No results for input(s): TSH, T4TOTAL, FREET4, T3FREE, THYROIDAB in the last 72 hours. Anemia Panel: No results for input(s): VITAMINB12, FOLATE, FERRITIN, TIBC, IRON, RETICCTPCT in the last 72 hours. Sepsis Labs: No results for input(s): PROCALCITON, LATICACIDVEN in the last 168 hours.   Recent Results (from the past 240 hour(s))  Respiratory (~20 pathogens) panel by PCR     Status: None   Collection Time: 01/15/21  2:01 PM   Specimen: Nasopharyngeal Swab; Respiratory  Result Value Ref Range Status   Adenovirus NOT DETECTED NOT DETECTED Final   Coronavirus 229E NOT DETECTED NOT DETECTED Final    Comment: (NOTE) The Coronavirus on the Respiratory Panel, DOES NOT test for the novel  Coronavirus (2019 nCoV)    Coronavirus HKU1 NOT DETECTED NOT DETECTED Final   Coronavirus NL63 NOT DETECTED NOT DETECTED Final   Coronavirus OC43 NOT DETECTED NOT  DETECTED Final   Metapneumovirus NOT DETECTED NOT DETECTED Final   Rhinovirus / Enterovirus NOT DETECTED NOT DETECTED Final   Influenza A NOT DETECTED NOT DETECTED Final   Influenza B NOT DETECTED NOT DETECTED Final   Parainfluenza Virus 1 NOT DETECTED NOT DETECTED Final   Parainfluenza Virus 2 NOT DETECTED NOT DETECTED Final   Parainfluenza Virus 3 NOT DETECTED NOT DETECTED Final   Parainfluenza Virus 4 NOT DETECTED NOT DETECTED Final   Respiratory Syncytial Virus NOT DETECTED NOT DETECTED Final   Bordetella pertussis NOT DETECTED NOT DETECTED Final   Bordetella Parapertussis NOT DETECTED NOT DETECTED Final   Chlamydophila pneumoniae NOT DETECTED NOT DETECTED Final   Mycoplasma pneumoniae NOT DETECTED NOT DETECTED Final  Comment: Performed at [facility], [place] [address] [place], [place] [address]  MRSA Next Gen by PCR, Nasal     Status: None   Collection Time: 01/15/21  2:02 PM   Specimen: Nasal Mucosa; Nasal Swab  Result Value Ref Range Status   MRSA by PCR Next Gen NOT DETECTED NOT DETECTED Final    Comment: (NOTE) The GeneXpert MRSA Assay (FDA approved for NASAL specimens only), is one component of a comprehensive MRSA colonization surveillance program. It is not intended to diagnose MRSA infection nor to guide or monitor treatment for MRSA infections. Test performance is not FDA approved in patients less than 81 years old. Performed at [facility], [address] [place], [place] [address]   Aspergillus Ag, BAL/Serum     Status: None   Collection Time: 01/19/21  4:43 AM  Result Value Ref Range Status   Aspergillus Ag, BAL/Serum 0.02 0.00 - 0.49 Index Final    Comment: (NOTE) Performed At: [facility] [address] [place], [place] [id number] Rush Farmer MD JS:[id number]          Radiology Studies: DG Chest Port 1 View  Result Date: 01/24/2021 CLINICAL DATA:  Follow-up multifocal pneumonia. EXAM: PORTABLE CHEST 1 VIEW COMPARISON:   01/22/2021 and older exams. FINDINGS: Mild interval decrease in the airspace lung opacities, most evident on the left. Lung volumes remain low. No new lung abnormalities. No visualized pleural effusion.  No pneumothorax. Right internal jugular Port-A-Cath is stable. IMPRESSION: 1. Mild interval improvement in lung aeration with decreased airspace lung opacities, most notably on the left. Electronically Signed   By: Lajean Manes M.D.   On: 01/24/2021 07:57        Scheduled Meds:  acyclovir  400 mg Oral BID   apixaban  5 mg Oral BID   Chlorhexidine Gluconate Cloth  6 each Topical Daily   digoxin  0.125 mg Oral Daily   diltiazem  180 mg Oral Daily   docusate sodium  200 mg Oral BID   feeding supplement (NEPRO CARB STEADY)  237 mL Oral TID BM   furosemide  20 mg Intravenous BID   guaiFENesin  1,200 mg Oral BID   insulin aspart  0-20 Units Subcutaneous TID WC   insulin aspart  0-5 Units Subcutaneous QHS   insulin aspart  20 Units Subcutaneous TID WC   insulin glargine-yfgn  30 Units Subcutaneous Daily   methylPREDNISolone (SOLU-MEDROL) injection  50 mg Intravenous Q12H   multivitamin with minerals  1 tablet Oral Daily   mycophenolate  500 mg Oral BID   sodium chloride flush  10-40 mL Intracatheter Q12H   sulfamethoxazole-trimethoprim  1 tablet Oral Q12H   Continuous Infusions:     LOS: 12 days    Time spent: 30 mins     Wyvonnia Dusky, MD [facility] Pager [phone]  If 7PM-7AM, please contact night-coverage  01/25/2021, 7:48 AM

## 2021-01-26 ENCOUNTER — Inpatient Hospital Stay: Payer: BC Managed Care – PPO

## 2021-01-26 DIAGNOSIS — I480 Paroxysmal atrial fibrillation: Secondary | ICD-10-CM | POA: Diagnosis not present

## 2021-01-26 DIAGNOSIS — J849 Interstitial pulmonary disease, unspecified: Secondary | ICD-10-CM | POA: Diagnosis not present

## 2021-01-26 DIAGNOSIS — J9621 Acute and chronic respiratory failure with hypoxia: Secondary | ICD-10-CM | POA: Diagnosis not present

## 2021-01-26 LAB — COMPREHENSIVE METABOLIC PANEL
ALT: 40 U/L (ref 0–44)
AST: 27 U/L (ref 15–41)
Albumin: 2.7 g/dL — ABNORMAL LOW (ref 3.5–5.0)
Alkaline Phosphatase: 70 U/L (ref 38–126)
Anion gap: 11 (ref 5–15)
BUN: 38 mg/dL — ABNORMAL HIGH (ref 8–23)
CO2: 33 mmol/L — ABNORMAL HIGH (ref 22–32)
Calcium: 8.3 mg/dL — ABNORMAL LOW (ref 8.9–10.3)
Chloride: 89 mmol/L — ABNORMAL LOW (ref 98–111)
Creatinine, Ser: 0.66 mg/dL (ref 0.61–1.24)
GFR, Estimated: >60 mL/min (ref >60)
Glucose, Bld: 319 mg/dL — ABNORMAL HIGH (ref 70–99)
Potassium: 4.8 mmol/L (ref 3.5–5.1)
Sodium: 133 mmol/L — ABNORMAL LOW (ref 135–145)
Total Bilirubin: 0.9 mg/dL (ref 0.3–1.2)
Total Protein: 5.1 g/dL — ABNORMAL LOW (ref 6.5–8.1)

## 2021-01-26 LAB — GLUCOSE, CAPILLARY
Glucose-Capillary: 289 mg/dL — ABNORMAL HIGH (ref 70–99)
Glucose-Capillary: 261 mg/dL — ABNORMAL HIGH (ref 70–99)
Glucose-Capillary: 294 mg/dL — ABNORMAL HIGH (ref 70–99)
Glucose-Capillary: 124 mg/dL — ABNORMAL HIGH (ref 70–99)
Glucose-Capillary: 40 mg/dL — CL (ref 70–99)
Glucose-Capillary: 44 mg/dL — CL (ref 70–99)
Glucose-Capillary: 107 mg/dL — ABNORMAL HIGH (ref 70–99)
Glucose-Capillary: 68 mg/dL — ABNORMAL LOW (ref 70–99)

## 2021-01-26 LAB — C-REACTIVE PROTEIN: CRP: 0.5 mg/dL (ref <1.0)

## 2021-01-26 NOTE — Progress Notes (Signed)
Physical Therapy Treatment Patient Details Name: Edward Atkinson MRN: [id number] DOB: 12/03/1960 Today's Date: 01/26/2021   History of Present Illness Pt is a 60 y.o. male with medical history significant for chronic hypoxic respiratory failure on 2 L continuous nasal cannula in the setting of interstitial lung disease, type 2 diabetes mellitus, pulmonary embolism in 2020 now chronically anticoagulated on Eliquis, who arrived to ED with shortness of breath. MD assessment includes:  acute on chronic hypoxic respiratory failure with hypoxia, possible sepsis due to bilateral pneumonia, rapid atrial fibrillation, anemia, and mild elevation of troponin with suspected demand ischemia.    PT Comments    Pt was seen for PT treatment today on 15L [place] and 92% at rest. Attempted to sit patient up at EOB and pt desaturated to 78% and unable to improve with cues for pursed lip breathing. Nurse and MD notified of desaturation. Pt returned to supine with HOB elevated with extended rest and returned to 90% SpO2 with cues for pursed lip breathing. Completed AROM exercises in supine and educated pt on maintaining SpO2 >88% with exercises and to stop and recover  to >90% prior to initiating exercises again. Pt does require frequent and prolonged rest breaks to improve oxygenation. Pt left in bed/chair position with SpO2 91% on 15L [place] and HR 80 bpm. Will continue to work with patient to improve overall deconditioning and progress mobility as applicable.    Recommendations for follow up therapy are one component of a multi-disciplinary discharge planning process, led by the attending physician.  Recommendations may be updated based on patient status, additional functional criteria and insurance authorization.  Follow Up Recommendations  Home health PT;Supervision for mobility/OOB     Equipment Recommendations  Rolling walker with 5" wheels;3in1 (PT)    Recommendations for Other Services       Precautions /  Restrictions Restrictions Weight Bearing Restrictions: No     Mobility  Bed Mobility Overal bed mobility: Independent             General bed mobility comments: IND with bed mobility however sitting at EOB O2 dropped from 92% to 78%    Transfers      General transfer comment: Deferred due to low SpO2 in sitting  Ambulation/Gait     General Gait Details: Deferred due to low SpO2 in sitting   Stairs     Wheelchair Mobility    Modified Rankin (Stroke Patients Only)       Balance Overall balance assessment: Needs assistance Sitting-balance support: Feet supported;No upper extremity supported Sitting balance-Leahy Scale: Normal Sitting balance - Comments: Normal balance in sitting however, SpO2 drop to 78%         Cognition Arousal/Alertness: Awake/alert Behavior During Therapy: WFL for tasks assessed/performed Overall Cognitive Status: Within Functional Limits for tasks assessed           Exercises Total Joint Exercises Ankle Circles/Pumps: AROM;Right;15 reps;Other (comment) (Semi-fowler's position) Quad Sets: AROM;Strengthening;Both;20 reps;Other (comment) (Semi-fowlers) Short Arc Quad: AROM;Strengthening;Both;20 reps (Semi-fowler's position) Hip ABduction/ADduction: AROM;Strengthening;Both;20 reps;Other (comment) (Semi-fowler's position) Long [employer]: AROM;Strengthening;Both;10 reps;Seated Other Exercises Other Exercises: Pt oxygen monitored throughout session with supine/semi-fowler position exercises. Pt able to complete 1 set of exercises with oxygen drop to 88% and would require verbal cues for improved pursed lip breathing and extended rest breaks to recover >90%. Pt left in bed in chair position and reported improvement in breath quality.    General Comments        Pertinent Vitals/Pain Pain Assessment: No/denies pain  Home Living                      Prior Function            PT Goals (current goals can now be found in the  care plan section) Acute Rehab PT Goals Patient Stated Goal: To get my strength back PT Goal Formulation: With patient Time For Goal Achievement: 02/01/21 Potential to Achieve Goals: Good Progress towards PT goals: Progressing toward goals    Frequency    Min 2X/week      PT Plan Current plan remains appropriate    Co-evaluation              AM-PAC PT "6 Clicks" Mobility   Outcome Measure  Help needed turning from your back to your side while in a flat bed without using bedrails?: None Help needed moving from lying on your back to sitting on the side of a flat bed without using bedrails?: None Help needed moving to and from a bed to a chair (including a wheelchair)?: A Little Help needed standing up from a chair using your arms (e.g., wheelchair or bedside chair)?: A Little Help needed to walk in hospital room?: A Little Help needed climbing 3-5 steps with a railing? : A Lot 6 Click Score: 19    End of Session Equipment Utilized During Treatment: Oxygen Activity Tolerance: Patient tolerated treatment well Patient left: in bed;with call bell/phone within reach;Other (comment) (In chair position) Nurse Communication: Mobility status;Other (comment) (Nurses and MD notified of desaturation) PT Visit Diagnosis: Muscle weakness (generalized) (M62.81);Difficulty in walking, not elsewhere classified (R26.2)     Time: 6629-4765 PT Time Calculation (min) (ACUTE ONLY): 32 min  Charges:  $Therapeutic Exercise: 8-22 mins $Therapeutic Activity: 8-22 mins                     Andrey Campanile, SPT    Andrey Campanile 01/26/2021, 4:54 PM

## 2021-01-26 NOTE — Progress Notes (Signed)
PROGRESS NOTE   HPI was taken from Dr. Velia Meyer: Edward Atkinson is a 60 y.o. male with medical history significant for chronic hypoxic respiratory failure on 2 L continuous nasal cannula in the setting of interstitial lung disease, type 2 diabetes mellitus, pulmonary embolism in 2020 now chronically anticoagulated on Eliquis, who is admitted to [facility] on 01/13/2021 with acute on chronic hypoxic respiratory failure after presenting from home to [facility] ED complaining of shortness of breath.    The patient reports 5 days of progressive shortness of breath associated with subjective fever and new onset nonproductive cough. Denies any associated orthopnea, PND, or new onset peripheral edema. No recent chest pain, diaphoresis, palpitations, N/V, pre-syncope, or syncope.  Not associated with any wheezing, hemoptysis, new lower extremity erythema, or calf tenderness. Denies any recent trauma, travel, surgical procedures, or periods of prolonged diminished ambulatory status. No recent melena or hematochezia.    While he notes associated subjective fever, he denies any associated chills, rigors, or generalized myalgias. No recent headache, neck stiffness, rhinitis, rhinorrhea, sore throat, abdominal pain, diarrhea, or rash. No known recent COVID-19 exposures. Denies dysuria, gross hematuria, or change in urinary urgency/frequency.    The patient confirms chronic hypoxic respiratory failure on continuous 2 L nasal cannula at baseline in the setting of interstitial lung disease.  He reports compliance with his outpatient respiratory regimen which includes Breztri, Pulmicort nebulizer, and as needed albuterol inhaler.  He conveys that he is a lifelong non-smoker.  In the setting of his interstitial lung disease, the patient reports that last week, that he has been weaned off of systemic corticosteroids by his outpatient pulmonologist, last dose of systemic corticosteroids occurring a few days  before the onset of his presenting progressive shortness of breath.   Hospital course from Dr. Jimmye Norman 9/21-9/27/22: Pt has acute on chronic hypoxic respiratory failures secondary to likely progressive ILD vs pneumonia. Pt has completed a course of abxs. Pt is currently on HFNC, steroids, lasix, cellcept & bronchodilators. HFNC has been on consistently this week w/ some minimal improvements. Pt understands weaning the oxygen back to baseline is a slow process. Also, gets intermittently tachycardic w/ exertion/movement and is on cardizem, digoxin and eliquis and also prn IV cardizem pushes. Pulmon is following and recs apprec    Edward Atkinson  YOV:[id number] DOB: 21-Oct-1960 DOA: 01/13/2021 PCP: Rusty Aus, MD    Assessment & Plan:   Principal Problem:   Acute on chronic respiratory failure with hypoxia [facility]) Active Problems:   Type 2 diabetes mellitus without complication, without long-term current use of insulin (HCC)   SOB (shortness of breath)   Interstitial lung disease (HCC)   Severe sepsis ([place])   CAP (community acquired pneumonia)   Elevated troponin   Protein calorie malnutrition (HCC)   Protein-calorie malnutrition, severe   Acute on chronic hypoxic respiratory failure: labile day to day but still w/ accessory muscle use. Continue on supplemental oxygen and wean back to baseline as tolerated, still on HFNC w/ intermittent minimal improvements. CTA chest negative for PE, & possible progressive ILD vs pneumonia. Completed abx course. Continue on lasix, IV steroids, cellcept, & bronchodilators as per pulmon. Continue on bactrim, acyclovir for PCP & HSV prophylaxis. Pulmon following and recs apprec   Sepsis : met criteria w/ leukocytosis, tachycardia and tachypnea & likely pneumonia. Resolved    Likely PAF: continue on cardizem, digoxin & eliquis as per cardio. IV cardizem prn. Outpatient f/u in 1-2 weeks w/ Dr. Saralyn Pilar. Cardio  signed off   Normocytic anemia: resolved     Mild elevation of troponin: likely secondary to demand ischemia   Thrombocytosis: resolved    DM2: well controlled, HbA1c 6.6. Continue on glargine, SSI w/ accuchecks   Hx of pulmonary embolism: continue on eliquis    Hx of follicular cell lymphoma: management per onco as an outpatient     DVT prophylaxis: eliquis  Code Status: full  Family Communication:  Disposition Plan: likely d/c back home   Level of care: Progressive Cardiac  Status is: Inpatient  Remains inpatient appropriate because:IV treatments appropriate due to intensity of illness or inability to take PO and Inpatient level of care appropriate due to severity of illness, still requiring HFNC   Dispo: The patient is from: Home              Anticipated d/c is to: Home              Patient currently is not medically stable to d/c.   Difficult to place patient : unclear   Consultants:  Cardio Pulmon   Procedures:   Antimicrobials:   Subjective:  Pt still c/o difficulty breathing but slightly improved yesterday   Objective: Vitals:   01/25/21 2327 01/26/21 0436 01/26/21 0503 01/26/21 0742  BP: 109/68 112/68  112/63  Pulse: (!) 107 79 72 79  Resp: _0 Temp: 98 F (36.7 C) 97.9 F (36.6 C)    TempSrc: Oral Oral    SpO2: 94% 96% 93% 94%  Weight:  70 kg    Height:        Intake/Output Summary (Last 24 hours) at 01/26/2021 0748 Last data filed at 01/25/2021 2200 Gross per 24 hour  Intake 1200 ml  Output 1901 ml  Net -701 ml   Filed Weights   01/25/21 0429 01/25/21 0538 01/26/21 0436  Weight: 71.6 kg 70.7 kg 70 kg    Examination:  General exam: Appears calm  Respiratory system: course breath sounds b/l. Accessory muscle use Cardiovascular system: S1/S2+. No rubs or clicks  Gastrointestinal system: Abd is soft, NT, ND & normal bowel sounds  Central nervous system: Alert and oriented. Moves all extremities  Psychiatry: Judgement and insight appears normal. Appropriate mood and affect     Data Reviewed: I have personally reviewed following labs and imaging studies  CBC: Recent Labs  Lab 01/21/21 0556 01/22/21 0624 01/23/21 0531 01/24/21 0500 01/25/21 0615  WBC 11.2* 14.7* 17.5* 19.0* 21.8*  HGB 12.4* 13.1 13.7 13.7 13.5  HCT 37.7* 41.1 42.9 40.9 41.8  MCV 84.7 83.2 83.3 83.3 82.6  PLT 353 398 402* 391 366   Basic Metabolic Panel: Recent Labs  Lab 01/20/21 0437 01/21/21 0556 01/22/21 0624 01/23/21 0531 01/24/21 0500 01/25/21 0615  NA 137 133* 135 138 135 135  K 4.3 4.2 4.2 4.9 4.3 4.5  CL 97* 94* 92* 95* 93* 93*  CO2 34* 31 33* 35* 33* 34*  GLUCOSE 250* 262* 300* 286* 218* 331*  BUN 25* 29* 32* 34* 36* 40*  CREATININE 0.53* 0.55* 0.56* 0.58* 0.60* 0.81  CALCIUM 8.5* 8.9 8.4* 8.6* 8.3* 8.3*  PHOS 2.7  --   --   --   --   --    GFR: Estimated Creatinine Clearance: 93.6 mL/min (by C-G formula based on SCr of 0.81 mg/dL). Liver Function Tests: Recent Labs  Lab 01/20/21 0437  ALBUMIN 2.3*   No results for input(s): LIPASE, AMYLASE in the last 168 hours. No results for  input(s): AMMONIA in the last 168 hours. Coagulation Profile: No results for input(s): INR, PROTIME in the last 168 hours. Cardiac Enzymes: No results for input(s): CKTOTAL, CKMB, CKMBINDEX, TROPONINI in the last 168 hours. BNP (last 3 results) No results for input(s): PROBNP in the last 8760 hours. HbA1C: No results for input(s): HGBA1C in the last 72 hours. CBG: Recent Labs  Lab 01/25/21 1114 01/25/21 1648 01/25/21 1726 01/25/21 2050 01/26/21 0746  GLUCAP 391* 48* 112* 257* 289*   Lipid Profile: No results for input(s): CHOL, HDL, LDLCALC, TRIG, CHOLHDL, LDLDIRECT in the last 72 hours. Thyroid Function Tests: No results for input(s): TSH, T4TOTAL, FREET4, T3FREE, THYROIDAB in the last 72 hours. Anemia Panel: No results for input(s): VITAMINB12, FOLATE, FERRITIN, TIBC, IRON, RETICCTPCT in the last 72 hours. Sepsis Labs: No results for input(s): PROCALCITON,  LATICACIDVEN in the last 168 hours.   Recent Results (from the past 240 hour(s))  Aspergillus Ag, BAL/Serum     Status: None   Collection Time: 01/19/21  4:43 AM  Result Value Ref Range Status   Aspergillus Ag, BAL/Serum 0.02 0.00 - 0.49 Index Final    Comment: (NOTE) Performed At: [facility] [place], [place] [id number] Rush Farmer MD TW:[id number]          Radiology Studies: No results found.      Scheduled Meds:  acyclovir  400 mg Oral BID   apixaban  5 mg Oral BID   Chlorhexidine Gluconate Cloth  6 each Topical Daily   digoxin  0.125 mg Oral Daily   diltiazem  180 mg Oral Daily   diphenhydrAMINE  25 mg Oral QHS   docusate sodium  200 mg Oral BID   feeding supplement (NEPRO CARB STEADY)  237 mL Oral TID BM   guaiFENesin  1,200 mg Oral BID   insulin aspart  0-20 Units Subcutaneous TID WC   insulin aspart  0-5 Units Subcutaneous QHS   insulin aspart  20 Units Subcutaneous TID WC   insulin glargine-yfgn  25 Units Subcutaneous Daily   methylPREDNISolone (SOLU-MEDROL) injection  40 mg Intravenous Q12H   multivitamin with minerals  1 tablet Oral Daily   mycophenolate  500 mg Oral BID   sodium chloride flush  10-40 mL Intracatheter Q12H   sulfamethoxazole-trimethoprim  1 tablet Oral Q12H   Continuous Infusions:     LOS: 13 days    Time spent: 20 mins     Wyvonnia Dusky, MD [facility] Pager [phone]  If 7PM-7AM, please contact night-coverage  01/26/2021, 7:48 AM

## 2021-01-26 NOTE — Progress Notes (Signed)
Glucose was 44, Gave two orange juices. It went up to 68. Gave another two orange juices. It back to 107 now. Pt is asymptomatic. Dr Sidney Ace notified. Educate pt to report any symptoms for hypoglycemia. Will keep monitoring

## 2021-01-26 NOTE — Progress Notes (Signed)
Nutrition Follow Up Note   DOCUMENTATION CODES:  Severe malnutrition in context of chronic illness  INTERVENTION:  Continue current diet as ordered Nepro Shake po TID, each supplement provides 425 kcal and 19 grams protein MVI po daily   NUTRITION DIAGNOSIS:  Severe Malnutrition related to chronic illness (lymphoma, ILD) as evidenced by severe fat depletion, severe muscle depletion, 16 percent weight loss in <3 months.  GOAL:  Patient will meet greater than or equal to 90% of their needs -progressing   MONITOR:  PO intake, Supplement acceptance, Labs, Weight trends, Skin, I & O's  ASSESSMENT:  60 y/o male with h/o ILD, COVID19, T2DM, PE on eliquis and lymphoma s/p Rituxan which is completed who is admitted with PNA and sepsis.  Pt with other providers at the time of visit. Reviewed intake since last assessment. Great intake of meals and  pt is accepting all nepro shakes. Weight remains stable since last assessment. Will continue current nutrition plan.  Average Meal Intake: 9/20-9/27: 96% intake x 18 recorded meals  Nutritionally Relevant Medications: Scheduled Meds:  docusate sodium  200 mg Oral BID   NEPRO CARB STEADY  237 mL Oral TID BM   insulin aspart  0-20 Units Subcutaneous TID WC   insulin aspart  0-5 Units Subcutaneous QHS   insulin aspart  20 Units Subcutaneous TID WC   insulin glargine-yfgn  25 Units Subcutaneous Daily   methylPREDNISolone (SOLU-MEDROL) injection  40 mg Intravenous Q12H   multivitamin with minerals  1 tablet Oral Daily   sulfamethoxazole-trimethoprim  1 tablet Oral Q12H   Labs Reviewed: Na 133 / chloride 89 BUN 38 SBG ranges from 48-289 mg/dL over the last 24 hours HgbA1c 6.6% (9/15)  NUTRITION - FOCUSED PHYSICAL EXAM: Flowsheet Row Most Recent Value  Orbital Region Moderate depletion  Upper Arm Region Moderate depletion  Thoracic and Lumbar Region Severe depletion  Buccal Region Mild depletion  Temple Region Moderate depletion   Clavicle Bone Region Severe depletion  Clavicle and Acromion Bone Region Severe depletion  Scapular Bone Region Severe depletion  Dorsal Hand Moderate depletion  Patellar Region Severe depletion  Anterior Thigh Region Severe depletion  Posterior Calf Region Severe depletion  Edema (RD Assessment) None  Hair Reviewed  Eyes Reviewed  Mouth Reviewed  Skin Reviewed  Nails Reviewed   Diet Order:   Diet Order             Diet Carb Modified Fluid consistency: Thin; Room service appropriate? Yes  Diet effective now                  EDUCATION NEEDS:  Education needs have been addressed  Skin:  Skin Assessment: Reviewed RN Assessment  Last BM:  9/26  Height:  Ht Readings from Last 1 Encounters:  01/14/21 6' (1.829 m)   Weight:  Wt Readings from Last 1 Encounters:  01/26/21 70 kg    Ideal Body Weight:  80.9 kg  BMI:  Body mass index is 20.93 kg/m.  Estimated Nutritional Needs:  Kcal:  2200-2500kcal/day Protein:  110-125g/day Fluid:  2.0-2.3L/day  Ranell Patrick, RD, LDN Clinical Dietitian Pager on Amion

## 2021-01-26 NOTE — Progress Notes (Signed)
Pulmonary Medicine          Date: 01/26/2021,   MRN# [id number] Edward Atkinson 26-Jul-1960     AdmissionWeight: 63.5 kg                 CurrentWeight: 70 kg   Referring physician: Dr Roderic Palau   CHIEF COMPLAINT:   Acute on chronic hypoxemic respiratory failure   HISTORY OF PRESENT ILLNESS   60 yo M w/hx of chronic hypoxemia and ILD post COVID19, T2DM, PE on eliquis, Lymphoma s/p Rituxan which is completed.  Came in due to worsening SOB and DOE specifically mild exertional defacation/urination with feelings of presyncope and severe dyspnea which started to get worse post tapering from steroid appx few days prior to onset of symptom progression.  He was placed on solumedrol and reported mild improvement.  He is on 10L /min [place] during my evaluation.  He had CT chest done with PE protocol and noted to have bilateral GGO infiltrates worse compared to June study. PCCM consultation for further evaluation and management.     01/17/21-  patient seems to be slighly clinically worse post reduction of IV steroids yesterday indicative of ongoing ILD exacerbation, thus far infectious workup is negative but inflammatory biomarkers are highly elevated also pointing towards ILD exacerbation.  There is new CXR in process this morning for interval changes. We discussed increasing steroids slightly and giving more time.  I will also initiate steroid sparing immunomodulation with BID cellcept as patient will likely need therapy for prolonged time period.  Compensatory tachycardia noted appreciate cardiology team.  Sugars with peaks and valleys with adjusted steroid therapy.  Patient with muscle wasting and protein calorie malnutrition but eating better this am.  01/18/21- patient is on HFNC at 60%, feels better, has not been able to tolerate BIPAP.  Wife is at bedside.  BP is borderline low MAP 76.  He has negative infectious workup and thus far ILD exacerbation seems to be main differential. He continues  to have peaks and valleys in blood glucose.  We started steroid sparing regimen with cellcept bid 250 mg and will increase dose as we taper off prednisone.  Remains tachyarrtythmic but improved. Appreciate everyone involved.     01/19/21- patient seen and examined at bedside.  Wife present during evaluation.  Ow HFNC weaned to 41%/45L/MIN.  CRP is still up, have increased cellcept and reducing solumedrol.  Patient felt stronger physically and was able to get OOB to chair. Cough is less now.  He is non-labored with breathing no accessory mm use today. He had dark stools with reduced hb, FOBT ordred.  Haptoglobin is elevated. Pathology slide review ordered.   01/20/21- patient is essentially unchanged from yesterday from pulmonary perspective. CXR also unchanged,  bloodwork with reduction in CRP.  Plan to wean O2 and continue OOB with PT/OT,  Continue current steroid dose and Cellcept.   01/21/21- patient is stable on HFNC.  He had small set back with increased O2 req after exerting himself in bathroom.  HFNC from 41/45 to 54/45.   He diuresed very well with lasix 20 bid >2L urine in past 24h.  Tachycardia is improved. He does not feel weaker and infact states he feels slowly improved.   01/22/21- I spoke to patient regarding goals of care and recommend DNR.  He is agreeable but I asked him to discuss with wife and family out of respect and we will revisit tommorow.  He states he is feeling better  and CRP is trending down but he remains on HFNC 60/40.  He was able to get OOB today and walked to bathroom but experienced mild desaturation with quick recovery "not much issues".      01/23/21- patient is still on high flow nasal canula , he is on 56%/45L.  We reviewed code status and he is still in discussion with family regarding DNR.   We discussed lung transplant per family request and this may be a future option if he is strong enough and recovers.  I have increased insluin TID to 20 from 15 and increased  bactrim to bid SS   01/24/21- patient weaned on HFNC to 49/45, he was able to use BIPAP overnight thinks its helping.  CXR this AM with interval improvement. Will keep on current dose of meds.  Repeat CRP in am.  Last BM yesterday well formed no blood, eating good, urine output very good on 20 bid lasix. Getting OOB with  transient desaturation.  Overall slight improvement.    01/25/21- patient has weaned down to 35%/40L/min.  He remains full code. Reviewed medical plan with Dr Jimmye Norman today.  Reduced steroids from 50 BID to 40 BID and also reduced insulin slightly.  01/26/21- patient is further weaned to 15L humidified HF nasal canula.  He did PT ROM exercises with mild desaturation. CXR today with expiratory image unable to see much interval changes. Patient reports improvement clinically. Plan to continue current regimen. Sugars slightly high due to steroids, will wean steroids soon. Continue cellcept as current 500 bid   PAST MEDICAL HISTORY   Past Medical History:  Diagnosis Date  . Chronic respiratory failure with hypoxia (HCC)    2L [place] continuous  . COVID-19   . Diabetes mellitus without complication ([place])   . Diverticulitis   . Follicular lymphoma of intra-abdominal lymph nodes ([place]) 02/15/2019  . ILD (interstitial lung disease) ([place])      SURGICAL HISTORY   Past Surgical History:  Procedure Laterality Date  . CHOLECYSTECTOMY    . COLON RESECTION     DUE TO DIVERTICULITIS  . FLEXIBLE BRONCHOSCOPY N/A 12/02/2019   Procedure: FLEXIBLE BRONCHOSCOPY;  Surgeon: Ottie Glazier, MD;  Location: [facility];  Service: Thoracic;  Laterality: N/A;  . PORTA CATH INSERTION N/A 03/11/2019   Procedure: PORTA CATH INSERTION;  Surgeon: Algernon Huxley, MD;  Location: [facility];  Service: Cardiovascular;  Laterality: N/A;  . PULMONARY THROMBECTOMY N/A 01/29/2019   Procedure: PULMONARY THROMBECTOMY;  Surgeon: Algernon Huxley, MD;  Location: [facility];  Service: Cardiovascular;   Laterality: N/A;     FAMILY HISTORY   Family History  Problem Relation Age of Onset  . Diabetes Brother      SOCIAL HISTORY   Social History   Tobacco Use  . Smoking status: Never  . Smokeless tobacco: Never  Vaping Use  . Vaping Use: Never used  Substance Use Topics  . Alcohol use: Yes    Alcohol/week: 0.0 - 2.0 standard drinks    Comment: occasional  . Drug use: Never     MEDICATIONS    Home Medication:    Current Medication:  Current Facility-Administered Medications:  .  acetaminophen (TYLENOL) tablet 650 mg, 650 mg, Oral, Q6H PRN, 650 mg at 01/18/21 1411 **OR** acetaminophen (TYLENOL) suppository 650 mg, 650 mg, Rectal, Q6H PRN, Howerter, Justin B, DO .  acyclovir (ZOVIRAX) 200 MG capsule 400 mg, 400 mg, Oral, BID, Kathie Dike, MD, 400 mg at 01/26/21 0859 .  apixaban (ELIQUIS) tablet 5 mg, 5 mg, Oral, BID, Beers, Shanon Brow, RPH, 5 mg at 01/26/21 0854 .  Chlorhexidine Gluconate Cloth 2 % PADS 6 each, 6 each, Topical, Daily, Kathie Dike, MD, 6 each at 01/26/21 0913 .  digoxin (LANOXIN) tablet 0.125 mg, 0.125 mg, Oral, Daily, Memon, Jehanzeb, MD, 0.125 mg at 01/26/21 0859 .  diltiazem (CARDIZEM CD) 24 hr capsule 180 mg, 180 mg, Oral, Daily, Paraschos, Alexander, MD, 180 mg at 01/26/21 0854 .  diltiazem (CARDIZEM) injection 10 mg, 10 mg, Intravenous, Q6H PRN, Wyvonnia Dusky, MD, 10 mg at 01/24/21 2328 .  diphenhydrAMINE (BENADRYL) capsule 25 mg, 25 mg, Oral, QHS, Wyvonnia Dusky, MD, 25 mg at 01/25/21 2213 .  docusate sodium (COLACE) capsule 200 mg, 200 mg, Oral, BID, Wyvonnia Dusky, MD, 200 mg at 01/26/21 0854 .  feeding supplement (NEPRO CARB STEADY) liquid 237 mL, 237 mL, Oral, TID BM, Memon, Jehanzeb, MD, Last Rate: 0 mL/hr at 01/21/21 2130, 237 mL at 01/26/21 1346 .  guaiFENesin (MUCINEX) 12 hr tablet 1,200 mg, 1,200 mg, Oral, BID, Kathie Dike, MD, 1,200 mg at 01/26/21 0854 .  guaiFENesin-dextromethorphan (ROBITUSSIN DM) 100-10 MG/5ML  syrup 5 mL, 5 mL, Oral, Q4H PRN, Kathie Dike, MD, 5 mL at 01/17/21 0648 .  insulin aspart (novoLOG) injection 0-20 Units, 0-20 Units, Subcutaneous, TID WC, Kathie Dike, MD, 11 Units at 01/26/21 1344 .  insulin aspart (novoLOG) injection 0-5 Units, 0-5 Units, Subcutaneous, QHS, Kathie Dike, MD, 3 Units at 01/25/21 2214 .  insulin aspart (novoLOG) injection 20 Units, 20 Units, Subcutaneous, TID WC, Ottie Glazier, MD, 20 Units at 01/26/21 1345 .  insulin glargine-yfgn (SEMGLEE) injection 25 Units, 25 Units, Subcutaneous, Daily, Ottie Glazier, MD, 25 Units at 01/26/21 0859 .  levalbuterol (XOPENEX) nebulizer solution 1.25 mg, 1.25 mg, Nebulization, Q4H PRN, Howerter, Justin B, DO, 1.25 mg at 01/17/21 2011 .  methylPREDNISolone sodium succinate (SOLU-MEDROL) 40 mg/mL injection 40 mg, 40 mg, Intravenous, Q12H, Lanney Gins, Shamond Skelton, MD, 40 mg at 01/26/21 0854 .  multivitamin with minerals tablet 1 tablet, 1 tablet, Oral, Daily, Kathie Dike, MD, 1 tablet at 01/26/21 0854 .  mycophenolate (CELLCEPT) capsule 500 mg, 500 mg, Oral, BID, Lanney Gins, Mouhamed Glassco, MD, 500 mg at 01/26/21 0859 .  sodium chloride flush (NS) 0.9 % injection 10-40 mL, 10-40 mL, Intracatheter, Q12H, Kathie Dike, MD, 10 mL at 01/26/21 0856 .  sodium chloride flush (NS) 0.9 % injection 10-40 mL, 10-40 mL, Intracatheter, PRN, Kathie Dike, MD, 10 mL at 01/24/21 0915 .  sulfamethoxazole-trimethoprim (BACTRIM) 400-80 MG per tablet 1 tablet, 1 tablet, Oral, Q12H, Ottie Glazier, MD, 1 tablet at 01/26/21 5701  Facility-Administered Medications Ordered in Other Encounters:  .  heparin lock flush 100 unit/mL, 500 Units, Intravenous, Once, Earlie Server, MD    ALLERGIES   Penicillins     REVIEW OF SYSTEMS    Review of Systems:  Gen:  Denies  fever, sweats, chills weigh loss  HEENT: Denies blurred vision, double vision, ear pain, eye pain, hearing loss, nose bleeds, sore throat Cardiac:  No dizziness, chest pain or  heaviness, chest tightness,edema Resp:   Denies cough or sputum porduction, shortness of breath,wheezing, hemoptysis,  Gi: Denies swallowing difficulty, stomach pain, nausea or vomiting, diarrhea, constipation, bowel incontinence Gu:  Denies bladder incontinence, burning urine Ext:   Denies Joint pain, stiffness or swelling Skin: Denies  skin rash, easy bruising or bleeding or hives Endoc:  Denies polyuria, polydipsia , polyphagia or weight change Psych:   Denies depression,  insomnia or hallucinations   Other:  All other systems negative   VS: BP 133/77 (BP Location: Left Arm)   Pulse 81   Temp 98.7 F (37.1 C)   Resp 17   Ht 6' (1.829 m)   Wt 70 kg   SpO2 97%   BMI 20.93 kg/m      PHYSICAL EXAM    GENERAL:NAD, no fevers, chills, no weakness no fatigue HEAD: Normocephalic, atraumatic.  EYES: Pupils equal, round, reactive to light. Extraocular muscles intact. No scleral icterus.  MOUTH: Moist mucosal membrane. Dentition intact. No abscess noted.  EAR, NOSE, THROAT: Clear without exudates. No external lesions.  NECK: Supple. No thyromegaly. No nodules. No JVD.  PULMONARY: Bilateral rhonchi CARDIOVASCULAR: S1 and S2. Regular rate and rhythm. No murmurs, rubs, or gallops. No edema. Pedal pulses 2+ bilaterally.  GASTROINTESTINAL: Soft, nontender, nondistended. No masses. Positive bowel sounds. No hepatosplenomegaly.  MUSCULOSKELETAL: No swelling, clubbing, or edema. Range of motion full in all extremities.  NEUROLOGIC: Cranial nerves II through XII are intact. No gross focal neurological deficits. Sensation intact. Reflexes intact.  SKIN: No ulceration, lesions, rashes, or cyanosis. Skin warm and dry. Turgor intact.  PSYCHIATRIC: Mood, affect within normal limits. The patient is awake, alert and oriented x 3. Insight, judgment intact.       IMAGING    CT Angio Chest PE W/Cm &/Or Wo Cm  Result Date: 01/13/2021 CLINICAL DATA:  Respiratory distress, hypoxia EXAM: CT  ANGIOGRAPHY CHEST WITH CONTRAST TECHNIQUE: Multidetector CT imaging of the chest was performed using the standard protocol during bolus administration of intravenous contrast. Multiplanar CT image reconstructions and MIPs were obtained to evaluate the vascular anatomy. CONTRAST:  19m OMNIPAQUE IOHEXOL 350 MG/ML SOLN COMPARISON:  10/26/2020, 01/13/2021 FINDINGS: Cardiovascular: This is a technically adequate evaluation of the pulmonary vasculature. No filling defects or pulmonary emboli. The heart is unremarkable without pericardial effusion. No evidence of thoracic aortic aneurysm or dissection. Right chest wall port via internal jugular approach tip within the superior vena cava. Mediastinum/Nodes: No pathologic adenopathy within the mediastinum, hila, or axilla. Thyroid, trachea, and esophagus are unremarkable. Lungs/Pleura: There has been interval progression of the multifocal ground-glass airspace disease seen previously. Changes are most pronounced within the dependent lower lobes. No effusion or pneumothorax. The central airways are patent, with persistent bronchiectasis. Upper Abdomen: No acute abnormality. Musculoskeletal: No acute or destructive bony lesions. Reconstructed images demonstrate no additional findings. Review of the MIP images confirms the above findings. IMPRESSION: 1. No evidence of pulmonary embolus. 2. Progressive multifocal bilateral ground-glass airspace disease, greatest at the lung bases. The differential diagnosis would include progressive postinflammatory scarring and fibrosis, multifocal atypical pneumonia, or hypersensitivity/drug toxicity. 3. Stable bronchiectasis. Electronically Signed   By: MRanda NgoM.D.   On: 01/13/2021 17:13   DG Chest Port 1 View  Result Date: 01/26/2021 CLINICAL DATA:  Shortness of breath, abnormal chest x-ray EXAM: PORTABLE CHEST 1 VIEW COMPARISON:  01/24/2021 FINDINGS: Right Port-A-Cath remains in place, unchanged. Low lung volumes with bilateral  airspace opacities, most pronounced in the left perihilar and lower lobe, unchanged since prior study. No visible effusions or pneumothorax. Heart is normal size. IMPRESSION: Low lung volumes with bilateral airspace disease, left-greater-than-right. No significant change. Electronically Signed   By: KRolm BaptiseM.D.   On: 01/26/2021 09:42   DG Chest Port 1 View  Result Date: 01/24/2021 CLINICAL DATA:  Follow-up multifocal pneumonia. EXAM: PORTABLE CHEST 1 VIEW COMPARISON:  01/22/2021 and older exams. FINDINGS: Mild interval decrease in the  airspace lung opacities, most evident on the left. Lung volumes remain low. No new lung abnormalities. No visualized pleural effusion.  No pneumothorax. Right internal jugular Port-A-Cath is stable. IMPRESSION: 1. Mild interval improvement in lung aeration with decreased airspace lung opacities, most notably on the left. Electronically Signed   By: Lajean Manes M.D.   On: 01/24/2021 07:57   DG Chest Port 1 View  Result Date: 01/22/2021 CLINICAL DATA:  Hypoxia, shortness of breath. EXAM: PORTABLE CHEST 1 VIEW COMPARISON:  January 20, 2021. FINDINGS: Stable cardiomediastinal silhouette. Right internal jugular Port-A-Cath is unchanged in position. Hypoinflation of the lungs is noted. Stable bilateral lung opacities are noted, left greater than right, most consistent with multifocal pneumonia. Bony thorax is unremarkable. IMPRESSION: Hypoinflation of the lungs. Stable bilateral lung opacities are noted, left greater than right, most consistent with multifocal pneumonia. Electronically Signed   By: Marijo Conception M.D.   On: 01/22/2021 19:26   DG Chest Port 1 View  Result Date: 01/20/2021 CLINICAL DATA:  Pneumonitis EXAM: PORTABLE CHEST 1 VIEW COMPARISON:  01/19/2021 FINDINGS: Shallow inspiration. Patchy infiltrates in the left lung and right lung base, similar to prior study and likely multifocal pneumonia. No pleural effusions. No pneumothorax. Mediastinal contours  appear intact. Heart size is normal. Power port type central venous catheter on the right with tip over the cavoatrial junction region. Surgical clips in the right upper quadrant. IMPRESSION: Shallow inspiration. Patchy infiltrates in both lungs, likely multifocal pneumonia, without change. Electronically Signed   By: Lucienne Capers M.D.   On: 01/20/2021 03:56   DG Chest Port 1 View  Result Date: 01/19/2021 CLINICAL DATA:  Abnormality on previous chest x-ray in a 60 year old male. EXAM: PORTABLE CHEST 1 VIEW COMPARISON:  CT from January 13, 2021, chest x-ray January 18, 2021. FINDINGS: RIGHT-sided Port-A-Cath terminates at the caval to atrial junction. Trachea is midline. Cardiomediastinal contours are stable. Interstitial and airspace opacities throughout the chest worse in the LEFT mid to upper chest, RIGHT upper chest lung bases bilaterally with similar appearance. On limited assessment there is no acute skeletal process. EKG leads project over the chest. IMPRESSION: No interval change in the appearance of the chest since previous imaging with continued decreased lung volumes and patchy bilateral opacities. Electronically Signed   By: Zetta Bills M.D.   On: 01/19/2021 07:59   DG Chest Port 1 View  Result Date: 01/18/2021 CLINICAL DATA:  Respiratory distress EXAM: PORTABLE CHEST 1 VIEW COMPARISON:  01/17/2021 FINDINGS: Cardiac shadow is stable. Right-sided chest wall port is again seen. Lungs are hypoinflated. Patchy airspace opacities again identified in both lungs left greater than right but stable from the prior exam. IMPRESSION: Patchy airspace opacities bilaterally stable from the prior exam. Electronically Signed   By: Inez Catalina M.D.   On: 01/18/2021 17:24   DG Chest Port 1 View  Result Date: 01/17/2021 CLINICAL DATA:  Worsening shortness of breath. EXAM: PORTABLE CHEST 1 VIEW COMPARISON:  01/14/2021 FINDINGS: 0826 hours. Low volume film. Patchy airspace disease again noted in both  lungs, left greater than right. Cardiopericardial silhouette is at upper limits of normal for size. Right Port-A-Cath again noted. Telemetry leads overlie the chest. IMPRESSION: Low volume film with patchy bilateral airspace disease, left greater than right. No substantial change. Electronically Signed   By: Misty Stanley M.D.   On: 01/17/2021 08:53   DG Chest Port 1 View  Result Date: 01/14/2021 CLINICAL DATA:  Shortness of breath.  History of cancer.  Diabetes. EXAM:  PORTABLE CHEST 1 VIEW.  Patient is rotated. COMPARISON:  Chest x-ray 01/13/2021, CT chest 01/13/2021 FINDINGS: Accessed right chest wall Port-A-Cath in stable position. The heart and mediastinal contours are within normal limits. Low lung volumes with persistent diffuse patchy airspace opacities. No pleural effusion. No pneumothorax. No acute osseous abnormality. Right upper quadrant surgical clips. IMPRESSION: Low lung volumes with persistent diffuse patchy airspace opacities. Electronically Signed   By: Iven Finn M.D.   On: 01/14/2021 21:08   DG Chest Port 1 View  Result Date: 01/13/2021 CLINICAL DATA:  Shortness of breath EXAM: PORTABLE CHEST 1 VIEW COMPARISON:  Chest radiograph 07/29/2019 FINDINGS: There is a right chest wall port with the tip terminating in the lower SVC/cavoatrial junction. The cardiomediastinal silhouette is within normal limits. Lung volumes are low with unchanged asymmetric elevation of the right hemidiaphragm. There are increased interstitial markings with diffuse reticular opacities throughout both lungs. There is no focal consolidation. There is no significant pleural effusion. There is no pneumothorax. There is no acute osseous abnormality. There is gaseous distention of the stomach and large bowel in the right upper quadrant. IMPRESSION: Low lung volumes with suspected mild pulmonary interstitial edema. Atypical/viral infection could have a similar appearance. Electronically Signed   By: Valetta Mole M.D.    On: 01/13/2021 14:24      ASSESSMENT/PLAN   Acute hypoxemic respiratory failure Due to acute exacerbation of interstitial lung disease  - present on admission  - COVID19 negative   - supplemental O2 during my evaluation 10l/min>>8L>>HFNC  -Respiratory viral panel-negative  -serum fungitell-negative  -legionella ab-negative  -nasal MRSA PCR-negative -Procalcitonin trend reviewed -strep pneumoniae ur AG -Histoplasma Ur Ag-negative -sputum resp cultures -AFB sputum expectorated specimen -sputum cytology  -Agree with Cefepime empirically  -Reduced Solumedrol to 40 BID - 01/15/21>>50 BID>>40 BID -reviewed pertinent imaging with patient today - ESR and CRP are elevated but improving -PT/OT for d/c planning  -please encourage patient to use incentive spirometer few times each hour while hospitalized.   -continue Cellcept 250 bid >>500 bid  -started lasix 20 bid -01/20/21 working well.  -UOP is adequate -Discussed goal of care - recommend DNR patient is in agreement but still discussing with wife.    Acute blood loss anemia-resolve d    - noted Hb trending down from 14 to 10 -    -fecal occult blood test    - may be due to GI bleed due to eliquis - monitor for grossly visible bleeding and stable vital sings    Thank you for allowing me to participate in the care of this patient.   Patient/Family are satisfied with care plan and all questions have been answered.   This document was prepared using Dragon voice recognition software and may include unintentional dictation errors.     Ottie Glazier, M.D.  Division of [facility]

## 2021-01-27 DIAGNOSIS — R778 Other specified abnormalities of plasma proteins: Secondary | ICD-10-CM | POA: Diagnosis not present

## 2021-01-27 DIAGNOSIS — D75839 Thrombocytosis, unspecified: Secondary | ICD-10-CM

## 2021-01-27 DIAGNOSIS — I480 Paroxysmal atrial fibrillation: Secondary | ICD-10-CM | POA: Diagnosis not present

## 2021-01-27 DIAGNOSIS — E11649 Type 2 diabetes mellitus with hypoglycemia without coma: Secondary | ICD-10-CM

## 2021-01-27 DIAGNOSIS — J9621 Acute and chronic respiratory failure with hypoxia: Secondary | ICD-10-CM | POA: Diagnosis not present

## 2021-01-27 LAB — BASIC METABOLIC PANEL
Anion gap: 8 (ref 5–15)
BUN: 29 mg/dL — ABNORMAL HIGH (ref 8–23)
CO2: 33 mmol/L — ABNORMAL HIGH (ref 22–32)
Calcium: 8.3 mg/dL — ABNORMAL LOW (ref 8.9–10.3)
Chloride: 93 mmol/L — ABNORMAL LOW (ref 98–111)
Creatinine, Ser: 0.63 mg/dL (ref 0.61–1.24)
GFR, Estimated: >60 mL/min (ref >60)
Glucose, Bld: 172 mg/dL — ABNORMAL HIGH (ref 70–99)
Potassium: 4.7 mmol/L (ref 3.5–5.1)
Sodium: 134 mmol/L — ABNORMAL LOW (ref 135–145)

## 2021-01-27 LAB — GLUCOSE, CAPILLARY
Glucose-Capillary: 245 mg/dL — ABNORMAL HIGH (ref 70–99)
Glucose-Capillary: 316 mg/dL — ABNORMAL HIGH (ref 70–99)
Glucose-Capillary: 153 mg/dL — ABNORMAL HIGH (ref 70–99)
Glucose-Capillary: 243 mg/dL — ABNORMAL HIGH (ref 70–99)
Glucose-Capillary: 281 mg/dL — ABNORMAL HIGH (ref 70–99)

## 2021-01-27 LAB — CBC
HCT: 37.7 % — ABNORMAL LOW (ref 39.0–52.0)
Hemoglobin: 12.2 g/dL — ABNORMAL LOW (ref 13.0–17.0)
MCH: 26.8 pg (ref 26.0–34.0)
MCHC: 32.4 g/dL (ref 30.0–36.0)
MCV: 82.9 fL (ref 80.0–100.0)
Platelets: 287 10*3/uL (ref 150–400)
RBC: 4.55 MIL/uL (ref 4.22–5.81)
RDW: 17.4 % — ABNORMAL HIGH (ref 11.5–15.5)
WBC: 19.3 10*3/uL — ABNORMAL HIGH (ref 4.0–10.5)
nRBC: 0 % (ref 0.0–0.2)

## 2021-01-27 MED ORDER — METHYLPREDNISOLONE SODIUM SUCC 40 MG IJ SOLR
30.0000 mg | Freq: Two times a day (BID) | INTRAMUSCULAR | Status: DC
Start: 2021-01-27 — End: 2021-01-28
  Administered 2021-01-27 – 2021-01-28 (×2): 30 mg via INTRAVENOUS
  Filled 2021-01-27 (×2): qty 1

## 2021-01-27 MED ORDER — INSULIN GLARGINE-YFGN 100 UNIT/ML ~~LOC~~ SOLN
14.0000 [IU] | Freq: Every day | SUBCUTANEOUS | Status: DC
  Administered 2021-01-27 – 2021-02-01 (×6): 14 [IU] via SUBCUTANEOUS
  Filled 2021-01-27 (×7): qty 0.14

## 2021-01-27 MED ORDER — INSULIN ASPART 100 UNIT/ML IJ SOLN
10.0000 [IU] | Freq: Three times a day (TID) | INTRAMUSCULAR | Status: DC
  Administered 2021-01-27 – 2021-02-13 (×49): 10 [IU] via SUBCUTANEOUS
  Filled 2021-01-27 (×50): qty 1

## 2021-01-27 MED ORDER — FUROSEMIDE 10 MG/ML IJ SOLN
20.0000 mg | Freq: Once | INTRAMUSCULAR | Status: AC
  Administered 2021-01-27: 20 mg via INTRAVENOUS
  Filled 2021-01-27: qty 2

## 2021-01-27 NOTE — Progress Notes (Signed)
Inpatient Diabetes Program Recommendations  AACE/ADA: New Consensus Statement on Inpatient Glycemic Control (2015)  Target Ranges:  Prepandial:   less than 140 mg/dL      Peak postprandial:   less than 180 mg/dL (1-2 hours)      Critically ill patients:  140 - 180 mg/dL  Results for EDVIN, ALBUS" (MRN [id number]) as of 01/27/2021 12:35  Ref. Range 01/26/2021 07:46 01/26/2021 12:06 01/26/2021 13:43 01/26/2021 16:35 01/26/2021 21:11 01/26/2021 21:14 01/26/2021 21:36 01/26/2021 22:00  Glucose-Capillary Latest Ref Range: 70 - 99 mg/dL 289 (H)  27 units Novolog   25 units Semglee @0859   261 (H) 294 (H)  31 units Novolog  124 (H)  23 units Novolog  40 (LL) 44 (LL) 68 (L) 107 (H)  Results for NEVYN, BOSSMAN" (MRN [id number]) as of 01/27/2021 12:35  Ref. Range 01/27/2021 08:43 01/27/2021 11:49  Glucose-Capillary Latest Ref Range: 70 - 99 mg/dL 245 (H)  17 units Novolog  316 (H)    Home DM Meds: Amaryl 2 mg daily     Metformin 500 mg TID   Current Orders: Novolog Resistant Correction Scale/ SSI (0-20 units) TID AC + HS                            Novolog 10 units TID with meals   Remains on Solumedrol 40 mg BID   MD- Note patient had Severe Hypoglycemia last PM after receiving 23 units Novolog at 5:30pm  Note that Semglee was stopped--None given today--Novolog Meal Coverage reduced to 10 units TID.  CBGs 245-316 so far today  Pt may benefit from restart of Semglee but at lower dose: Could we try Semglee 14 units QHS? (0.2 units/kg)    --Will follow patient during hospitalization--  Wyn Quaker RN, MSN, CDE Diabetes Coordinator Inpatient Glycemic Control Team Team Pager: [phone] (8a-5p)

## 2021-01-27 NOTE — Progress Notes (Signed)
Pulmonary Medicine          Date: 01/27/2021,   MRN# [id number] Edward Atkinson [birth year]     AdmissionWeight: 63.5 kg                 CurrentWeight: 86 kg   Referring physician: Dr Roderic Palau   CHIEF COMPLAINT:   Acute on chronic hypoxemic respiratory failure   HISTORY OF PRESENT ILLNESS   60 yo M w/hx of chronic hypoxemia and ILD post COVID19, T2DM, PE on eliquis, Lymphoma s/p Rituxan which is completed.  Came in due to worsening SOB and DOE specifically mild exertional defacation/urination with feelings of presyncope and severe dyspnea which started to get worse post tapering from steroid appx few days prior to onset of symptom progression.  He was placed on solumedrol and reported mild improvement.  He is on 10L /min [place] during my evaluation.  He had CT chest done with PE protocol and noted to have bilateral GGO infiltrates worse compared to June study. PCCM consultation for further evaluation and management.     01/17/21-  patient seems to be slighly clinically worse post reduction of IV steroids yesterday indicative of ongoing ILD exacerbation, thus far infectious workup is negative but inflammatory biomarkers are highly elevated also pointing towards ILD exacerbation.  There is new CXR in process this morning for interval changes. We discussed increasing steroids slightly and giving more time.  I will also initiate steroid sparing immunomodulation with BID cellcept as patient will likely need therapy for prolonged time period.  Compensatory tachycardia noted appreciate cardiology team.  Sugars with peaks and valleys with adjusted steroid therapy.  Patient with muscle wasting and protein calorie malnutrition but eating better this am.  01/18/21- patient is on HFNC at 60%, feels better, has not been able to tolerate BIPAP.  Wife is at bedside.  BP is borderline low MAP 76.  He has negative infectious workup and thus far ILD exacerbation seems to be main differential. He continues  to have peaks and valleys in blood glucose.  We started steroid sparing regimen with cellcept bid 250 mg and will increase dose as we taper off prednisone.  Remains tachyarrtythmic but improved. Appreciate everyone involved.     01/19/21- patient seen and examined at bedside.  Wife present during evaluation.  Ow HFNC weaned to 41%/45L/MIN.  CRP is still up, have increased cellcept and reducing solumedrol.  Patient felt stronger physically and was able to get OOB to chair. Cough is less now.  He is non-labored with breathing no accessory mm use today. He had dark stools with reduced hb, FOBT ordred.  Haptoglobin is elevated. Pathology slide review ordered.   01/20/21- patient is essentially unchanged from yesterday from pulmonary perspective. CXR also unchanged,  bloodwork with reduction in CRP.  Plan to wean O2 and continue OOB with PT/OT,  Continue current steroid dose and Cellcept.   01/21/21- patient is stable on HFNC.  He had small set back with increased O2 req after exerting himself in bathroom.  HFNC from 41/45 to 54/45.   He diuresed very well with lasix 20 bid >2L urine in past 24h.  Tachycardia is improved. He does not feel weaker and infact states he feels slowly improved.   01/22/21- I spoke to patient regarding goals of care and recommend DNR.  He is agreeable but I asked him to discuss with wife and family out of respect and we will revisit tommorow.  He states he is feeling better  and CRP is trending down but he remains on HFNC 60/40.  He was able to get OOB today and walked to bathroom but experienced mild desaturation with quick recovery "not much issues".      01/23/21- patient is still on high flow nasal canula , he is on 56%/45L.  We reviewed code status and he is still in discussion with family regarding DNR.   We discussed lung transplant per family request and this may be a future option if he is strong enough and recovers.  I have increased insluin TID to 20 from 15 and increased  bactrim to bid SS   01/24/21- patient weaned on HFNC to 49/45, he was able to use BIPAP overnight thinks its helping.  CXR this AM with interval improvement. Will keep on current dose of meds.  Repeat CRP in am.  Last BM yesterday well formed no blood, eating good, urine output very good on 20 bid lasix. Getting OOB with  transient desaturation.  Overall slight improvement.    01/25/21- patient has weaned down to 35%/40L/min.  He remains full code. Reviewed medical plan with Dr Jimmye Norman today.  Reduced steroids from 50 BID to 40 BID and also reduced insulin slightly.    01/26/21- patient is further weaned to 15L humidified HF nasal canula.  He did PT ROM exercises with mild desaturation. CXR today with expiratory image unable to see much interval changes. Patient reports improvement clinically. Plan to continue current regimen. Sugars slightly high due to steroids, will wean steroids soon. Continue cellcept as current 500 bid  01/27/21- patient is relatively same as yesterday, his O2 is weaned down to 14L.  He remains on IV solumedrol at 50 bid, we did discuss weaning this further to 40 BID. Will keep cellcept at current dose 500 bid.  His ankles have been down in seated position and are edematous on examination. He continues to participate with PT and feels good subjectively. We discussed starting dose of lasix for pedal edema.    PAST MEDICAL HISTORY   Past Medical History:  Diagnosis Date  . Chronic respiratory failure with hypoxia (HCC)    2L [place] continuous  . COVID-19   . Diabetes mellitus without complication ([place])   . Diverticulitis   . Follicular lymphoma of intra-abdominal lymph nodes ([place]) 02/15/2019  . ILD (interstitial lung disease) ([place])      SURGICAL HISTORY   Past Surgical History:  Procedure Laterality Date  . CHOLECYSTECTOMY    . COLON RESECTION     DUE TO DIVERTICULITIS  . FLEXIBLE BRONCHOSCOPY N/A 12/02/2019   Procedure: FLEXIBLE BRONCHOSCOPY;  Surgeon: Ottie Glazier,  MD;  Location: [facility] ORS;  Service: Thoracic;  Laterality: N/A;  . PORTA CATH INSERTION N/A 03/11/2019   Procedure: PORTA CATH INSERTION;  Surgeon: Algernon Huxley, MD;  Location: [place] [facility];  Service: Cardiovascular;  Laterality: N/A;  . PULMONARY THROMBECTOMY N/A 01/29/2019   Procedure: PULMONARY THROMBECTOMY;  Surgeon: Algernon Huxley, MD;  Location: [place] [facility];  Service: Cardiovascular;  Laterality: N/A;     FAMILY HISTORY   Family History  Problem Relation Age of Onset  . Diabetes Brother      SOCIAL HISTORY   Social History   Tobacco Use  . Smoking status: Never  . Smokeless tobacco: Never  Vaping Use  . Vaping Use: Never used  Substance Use Topics  . Alcohol use: Yes    Alcohol/week: 0.0 - 2.0 standard drinks    Comment: occasional  .  Drug use: Never     MEDICATIONS    Home Medication:    Current Medication:  Current Facility-Administered Medications:  .  acetaminophen (TYLENOL) tablet 650 mg, 650 mg, Oral, Q6H PRN, 650 mg at 01/18/21 1411 **OR** acetaminophen (TYLENOL) suppository 650 mg, 650 mg, Rectal, Q6H PRN, Howerter, Justin B, DO .  acyclovir (ZOVIRAX) 200 MG capsule 400 mg, 400 mg, Oral, BID, Kathie Dike, MD, 400 mg at 01/27/21 0920 .  apixaban (ELIQUIS) tablet 5 mg, 5 mg, Oral, BID, Beers, Shanon Brow, RPH, 5 mg at 01/27/21 0911 .  Chlorhexidine Gluconate Cloth 2 % PADS 6 each, 6 each, Topical, Daily, Kathie Dike, MD, 6 each at 01/27/21 0912 .  digoxin (LANOXIN) tablet 0.125 mg, 0.125 mg, Oral, Daily, Memon, Jehanzeb, MD, 0.125 mg at 01/27/21 0911 .  diltiazem (CARDIZEM CD) 24 hr capsule 180 mg, 180 mg, Oral, Daily, Paraschos, Alexander, MD, 180 mg at 01/27/21 0911 .  diltiazem (CARDIZEM) injection 10 mg, 10 mg, Intravenous, Q6H PRN, Wyvonnia Dusky, MD, 10 mg at 01/24/21 2328 .  diphenhydrAMINE (BENADRYL) capsule 25 mg, 25 mg, Oral, QHS, Wyvonnia Dusky, MD, 25 mg at 01/26/21 2202 .  docusate sodium (COLACE) capsule 200 mg,  200 mg, Oral, BID, Wyvonnia Dusky, MD, 200 mg at 01/27/21 0911 .  feeding supplement (NEPRO CARB STEADY) liquid 237 mL, 237 mL, Oral, TID BM, Memon, Jehanzeb, MD, Last Rate: 0 mL/hr at 01/21/21 2130, 237 mL at 01/27/21 1100 .  guaiFENesin (MUCINEX) 12 hr tablet 1,200 mg, 1,200 mg, Oral, BID, Kathie Dike, MD, 1,200 mg at 01/27/21 0911 .  guaiFENesin-dextromethorphan (ROBITUSSIN DM) 100-10 MG/5ML syrup 5 mL, 5 mL, Oral, Q4H PRN, Kathie Dike, MD, 5 mL at 01/17/21 0648 .  insulin aspart (novoLOG) injection 0-20 Units, 0-20 Units, Subcutaneous, TID WC, Kathie Dike, MD, 15 Units at 01/27/21 1242 .  insulin aspart (novoLOG) injection 0-5 Units, 0-5 Units, Subcutaneous, QHS, Kathie Dike, MD, 3 Units at 01/25/21 2214 .  insulin aspart (novoLOG) injection 10 Units, 10 Units, Subcutaneous, TID WC, Loletha Grayer, MD, 10 Units at 01/27/21 1242 .  levalbuterol (XOPENEX) nebulizer solution 1.25 mg, 1.25 mg, Nebulization, Q4H PRN, Howerter, Justin B, DO, 1.25 mg at 01/17/21 2011 .  methylPREDNISolone sodium succinate (SOLU-MEDROL) 40 mg/mL injection 40 mg, 40 mg, Intravenous, Q12H, Lanney Gins, Etrulia Zarr, MD, 40 mg at 01/27/21 0911 .  multivitamin with minerals tablet 1 tablet, 1 tablet, Oral, Daily, Kathie Dike, MD, 1 tablet at 01/27/21 0911 .  mycophenolate (CELLCEPT) capsule 500 mg, 500 mg, Oral, BID, Lanney Gins, Demeco Ducksworth, MD, 500 mg at 01/27/21 0911 .  sodium chloride flush (NS) 0.9 % injection 10-40 mL, 10-40 mL, Intracatheter, Q12H, Memon, Jehanzeb, MD, 20 mL at 01/27/21 1000 .  sodium chloride flush (NS) 0.9 % injection 10-40 mL, 10-40 mL, Intracatheter, PRN, Kathie Dike, MD, 10 mL at 01/24/21 0915 .  sulfamethoxazole-trimethoprim (BACTRIM) 400-80 MG per tablet 1 tablet, 1 tablet, Oral, Q12H, Ottie Glazier, MD, 1 tablet at 01/27/21 0911  Facility-Administered Medications Ordered in Other Encounters:  .  heparin lock flush 100 unit/mL, 500 Units, Intravenous, Once, Earlie Server,  MD    ALLERGIES   Penicillins     REVIEW OF SYSTEMS    Review of Systems:  Gen:  Denies  fever, sweats, chills weigh loss  HEENT: Denies blurred vision, double vision, ear pain, eye pain, hearing loss, nose bleeds, sore throat Cardiac:  No dizziness, chest pain or heaviness, chest tightness,edema Resp:   Denies cough or sputum porduction, shortness of  breath,wheezing, hemoptysis,  Gi: Denies swallowing difficulty, stomach pain, nausea or vomiting, diarrhea, constipation, bowel incontinence Gu:  Denies bladder incontinence, burning urine Ext:   Denies Joint pain, stiffness or swelling Skin: Denies  skin rash, easy bruising or bleeding or hives Endoc:  Denies polyuria, polydipsia , polyphagia or weight change Psych:   Denies depression, insomnia or hallucinations   Other:  All other systems negative   VS: BP 119/74 (BP Location: Right Arm)   Pulse 83   Temp 98.4 F (36.9 C) (Oral)   Resp 20   Ht 6' (1.829 m)   Wt 73 kg   SpO2 95%   BMI 21.83 kg/m      PHYSICAL EXAM    GENERAL:NAD, no fevers, chills, no weakness no fatigue HEAD: Normocephalic, atraumatic.  EYES: Pupils equal, round, reactive to light. Extraocular muscles intact. No scleral icterus.  MOUTH: Moist mucosal membrane. Dentition intact. No abscess noted.  EAR, NOSE, THROAT: Clear without exudates. No external lesions.  NECK: Supple. No thyromegaly. No nodules. No JVD.  PULMONARY: Bilateral rhonchi CARDIOVASCULAR: S1 and S2. Regular rate and rhythm. No murmurs, rubs, or gallops. No edema. Pedal pulses 2+ bilaterally.  GASTROINTESTINAL: Soft, nontender, nondistended. No masses. Positive bowel sounds. No hepatosplenomegaly.  MUSCULOSKELETAL: No swelling, clubbing, or edema. Range of motion full in all extremities.  NEUROLOGIC: Cranial nerves II through XII are intact. No gross focal neurological deficits. Sensation intact. Reflexes intact.  SKIN: No ulceration, lesions, rashes, or cyanosis. Skin warm and  dry. Turgor intact.  PSYCHIATRIC: Mood, affect within normal limits. The patient is awake, alert and oriented x 3. Insight, judgment intact.       IMAGING    CT Angio Chest PE W/Cm &/Or Wo Cm  Result Date: 01/13/2021 CLINICAL DATA:  Respiratory distress, hypoxia EXAM: CT ANGIOGRAPHY CHEST WITH CONTRAST TECHNIQUE: Multidetector CT imaging of the chest was performed using the standard protocol during bolus administration of intravenous contrast. Multiplanar CT image reconstructions and MIPs were obtained to evaluate the vascular anatomy. CONTRAST:  4m OMNIPAQUE IOHEXOL 350 MG/ML SOLN COMPARISON:  10/26/2020, 01/13/2021 FINDINGS: Cardiovascular: This is a technically adequate evaluation of the pulmonary vasculature. No filling defects or pulmonary emboli. The heart is unremarkable without pericardial effusion. No evidence of thoracic aortic aneurysm or dissection. Right chest wall port via internal jugular approach tip within the superior vena cava. Mediastinum/Nodes: No pathologic adenopathy within the mediastinum, hila, or axilla. Thyroid, trachea, and esophagus are unremarkable. Lungs/Pleura: There has been interval progression of the multifocal ground-glass airspace disease seen previously. Changes are most pronounced within the dependent lower lobes. No effusion or pneumothorax. The central airways are patent, with persistent bronchiectasis. Upper Abdomen: No acute abnormality. Musculoskeletal: No acute or destructive bony lesions. Reconstructed images demonstrate no additional findings. Review of the MIP images confirms the above findings. IMPRESSION: 1. No evidence of pulmonary embolus. 2. Progressive multifocal bilateral ground-glass airspace disease, greatest at the lung bases. The differential diagnosis would include progressive postinflammatory scarring and fibrosis, multifocal atypical pneumonia, or hypersensitivity/drug toxicity. 3. Stable bronchiectasis. Electronically Signed   By: MRanda NgoM.D.   On: 01/13/2021 17:13   DG Chest Port 1 View  Result Date: 01/26/2021 CLINICAL DATA:  Shortness of breath, abnormal chest x-ray EXAM: PORTABLE CHEST 1 VIEW COMPARISON:  01/24/2021 FINDINGS: Right Port-A-Cath remains in place, unchanged. Low lung volumes with bilateral airspace opacities, most pronounced in the left perihilar and lower lobe, unchanged since prior study. No visible effusions or pneumothorax. Heart is normal size. IMPRESSION:  Low lung volumes with bilateral airspace disease, left-greater-than-right. No significant change. Electronically Signed   By: Rolm Baptise M.D.   On: 01/26/2021 09:42   DG Chest Port 1 View  Result Date: 01/24/2021 CLINICAL DATA:  Follow-up multifocal pneumonia. EXAM: PORTABLE CHEST 1 VIEW COMPARISON:  01/22/2021 and older exams. FINDINGS: Mild interval decrease in the airspace lung opacities, most evident on the left. Lung volumes remain low. No new lung abnormalities. No visualized pleural effusion.  No pneumothorax. Right internal jugular Port-A-Cath is stable. IMPRESSION: 1. Mild interval improvement in lung aeration with decreased airspace lung opacities, most notably on the left. Electronically Signed   By: Lajean Manes M.D.   On: 01/24/2021 07:57   DG Chest Port 1 View  Result Date: 01/22/2021 CLINICAL DATA:  Hypoxia, shortness of breath. EXAM: PORTABLE CHEST 1 VIEW COMPARISON:  January 20, 2021. FINDINGS: Stable cardiomediastinal silhouette. Right internal jugular Port-A-Cath is unchanged in position. Hypoinflation of the lungs is noted. Stable bilateral lung opacities are noted, left greater than right, most consistent with multifocal pneumonia. Bony thorax is unremarkable. IMPRESSION: Hypoinflation of the lungs. Stable bilateral lung opacities are noted, left greater than right, most consistent with multifocal pneumonia. Electronically Signed   By: Marijo Conception M.D.   On: 01/22/2021 19:26   DG Chest Port 1 View  Result Date:  01/20/2021 CLINICAL DATA:  Pneumonitis EXAM: PORTABLE CHEST 1 VIEW COMPARISON:  01/19/2021 FINDINGS: Shallow inspiration. Patchy infiltrates in the left lung and right lung base, similar to prior study and likely multifocal pneumonia. No pleural effusions. No pneumothorax. Mediastinal contours appear intact. Heart size is normal. Power port type central venous catheter on the right with tip over the cavoatrial junction region. Surgical clips in the right upper quadrant. IMPRESSION: Shallow inspiration. Patchy infiltrates in both lungs, likely multifocal pneumonia, without change. Electronically Signed   By: Lucienne Capers M.D.   On: 01/20/2021 03:56   DG Chest Port 1 View  Result Date: 01/19/2021 CLINICAL DATA:  Abnormality on previous chest x-ray in a 60 year old male. EXAM: PORTABLE CHEST 1 VIEW COMPARISON:  CT from January 13, 2021, chest x-ray January 18, 2021. FINDINGS: RIGHT-sided Port-A-Cath terminates at the caval to atrial junction. Trachea is midline. Cardiomediastinal contours are stable. Interstitial and airspace opacities throughout the chest worse in the LEFT mid to upper chest, RIGHT upper chest lung bases bilaterally with similar appearance. On limited assessment there is no acute skeletal process. EKG leads project over the chest. IMPRESSION: No interval change in the appearance of the chest since previous imaging with continued decreased lung volumes and patchy bilateral opacities. Electronically Signed   By: Zetta Bills M.D.   On: 01/19/2021 07:59   DG Chest Port 1 View  Result Date: 01/18/2021 CLINICAL DATA:  Respiratory distress EXAM: PORTABLE CHEST 1 VIEW COMPARISON:  01/17/2021 FINDINGS: Cardiac shadow is stable. Right-sided chest wall port is again seen. Lungs are hypoinflated. Patchy airspace opacities again identified in both lungs left greater than right but stable from the prior exam. IMPRESSION: Patchy airspace opacities bilaterally stable from the prior exam.  Electronically Signed   By: Inez Catalina M.D.   On: 01/18/2021 17:24   DG Chest Port 1 View  Result Date: 01/17/2021 CLINICAL DATA:  Worsening shortness of breath. EXAM: PORTABLE CHEST 1 VIEW COMPARISON:  01/14/2021 FINDINGS: 0826 hours. Low volume film. Patchy airspace disease again noted in both lungs, left greater than right. Cardiopericardial silhouette is at upper limits of normal for size. Right Port-A-Cath again noted.  Telemetry leads overlie the chest. IMPRESSION: Low volume film with patchy bilateral airspace disease, left greater than right. No substantial change. Electronically Signed   By: Misty Stanley M.D.   On: 01/17/2021 08:53   DG Chest Port 1 View  Result Date: 01/14/2021 CLINICAL DATA:  Shortness of breath.  History of cancer.  Diabetes. EXAM: PORTABLE CHEST 1 VIEW.  Patient is rotated. COMPARISON:  Chest x-ray 01/13/2021, CT chest 01/13/2021 FINDINGS: Accessed right chest wall Port-A-Cath in stable position. The heart and mediastinal contours are within normal limits. Low lung volumes with persistent diffuse patchy airspace opacities. No pleural effusion. No pneumothorax. No acute osseous abnormality. Right upper quadrant surgical clips. IMPRESSION: Low lung volumes with persistent diffuse patchy airspace opacities. Electronically Signed   By: Iven Finn M.D.   On: 01/14/2021 21:08   DG Chest Port 1 View  Result Date: 01/13/2021 CLINICAL DATA:  Shortness of breath EXAM: PORTABLE CHEST 1 VIEW COMPARISON:  Chest radiograph 07/29/2019 FINDINGS: There is a right chest wall port with the tip terminating in the lower SVC/cavoatrial junction. The cardiomediastinal silhouette is within normal limits. Lung volumes are low with unchanged asymmetric elevation of the right hemidiaphragm. There are increased interstitial markings with diffuse reticular opacities throughout both lungs. There is no focal consolidation. There is no significant pleural effusion. There is no pneumothorax. There is  no acute osseous abnormality. There is gaseous distention of the stomach and large bowel in the right upper quadrant. IMPRESSION: Low lung volumes with suspected mild pulmonary interstitial edema. Atypical/viral infection could have a similar appearance. Electronically Signed   By: Valetta Mole M.D.   On: 01/13/2021 14:24      ASSESSMENT/PLAN   Acute hypoxemic respiratory failure Due to acute exacerbation of interstitial lung disease  - present on admission  - COVID19 negative   - supplemental O2 during my evaluation 10l/min>>8L>>HFNC  -Respiratory viral panel-negative  -serum fungitell-negative  -legionella ab-negative  -nasal MRSA PCR-negative -Procalcitonin trend reviewed -strep pneumoniae ur AG -Histoplasma Ur Ag-negative -sputum resp cultures -AFB sputum expectorated specimen -sputum cytology  -completed  Cefepime course for cap -Reduced Solumedrol to 40 BID - 01/15/21>>50 BID>>40 BID>>30 BID -reviewed pertinent imaging with patient today - ESR and CRP are elevated but improving -PT/OT for d/c planning  -please encourage patient to use incentive spirometer few times each hour while hospitalized.   -continue Cellcept 250 bid >>500 bid  -started lasix 20 bid -01/20/21 working well.  -UOP is adequate -Discussed goal of care - recommend DNR patient is in agreement but still discussing with wife.    Acute blood loss anemia-resolved    - noted Hb trending down from 14 to 10 -    -fecal occult blood test    - may be due to GI bleed due to eliquis - monitor for grossly visible bleeding and stable vital sings    Thank you for allowing me to participate in the care of this patient.   Patient/Family are satisfied with care plan and all questions have been answered.   This document was prepared using Dragon voice recognition software and may include unintentional dictation errors.     Ottie Glazier, M.D.  Division of [facility]

## 2021-01-27 NOTE — Progress Notes (Signed)
Patient ID: Edward Atkinson, male   DOB: Jul 21, 1960, 60 y.o.   MRN: [id number] [facility] PROGRESS NOTE  Edward Atkinson VPX:[id number] DOB: April 07, 1961 DOA: 01/13/2021 PCP: Rusty Aus, MD  HPI/Subjective: Patient feels okay.  Had a low sugar again last night and had symptoms.  Still hypoxic with very minimal movement.  Admitted 14 days ago with shortness of breath and acute exacerbation of interstitial lung disease.  Objective: Vitals:   01/27/21 1147 01/27/21 1552  BP: 119/74 111/75  Pulse: 83 80  Resp: 20 (!) 22  Temp: 98.4 F (36.9 C) 98.4 F (36.9 C)  SpO2: 95% 98%    Intake/Output Summary (Last 24 hours) at 01/27/2021 1615 Last data filed at 01/27/2021 1556 Gross per 24 hour  Intake 1200 ml  Output 1875 ml  Net -675 ml   Filed Weights   01/25/21 0538 01/26/21 0436 01/27/21 0457  Weight: 70.7 kg 70 kg 73 kg    ROS: Review of Systems  Respiratory:  Positive for cough and shortness of breath.   Cardiovascular:  Negative for chest pain.  Gastrointestinal:  Negative for abdominal pain, nausea and vomiting.  Exam: Physical Exam HENT:     Head: Normocephalic.     Mouth/Throat:     Pharynx: No oropharyngeal exudate.  Eyes:     General: Lids are normal.     Conjunctiva/sclera: Conjunctivae normal.     Pupils: Pupils are equal, round, and reactive to light.  Cardiovascular:     Rate and Rhythm: Normal rate and regular rhythm.     Heart sounds: Normal heart sounds, S1 normal and S2 normal.  Pulmonary:     Breath sounds: Examination of the right-middle field reveals decreased breath sounds. Examination of the left-middle field reveals decreased breath sounds. Examination of the right-lower field reveals decreased breath sounds. Examination of the left-lower field reveals decreased breath sounds. Decreased breath sounds present. No wheezing, rhonchi or rales.  Abdominal:     Palpations: Abdomen is soft.     Tenderness: There is no abdominal tenderness.   Musculoskeletal:     Right lower leg: No swelling.     Left lower leg: No swelling.  Skin:    General: Skin is warm.     Findings: No rash.  Neurological:     Mental Status: He is alert and oriented to person, place, and time.      Scheduled Meds:  acyclovir  400 mg Oral BID   apixaban  5 mg Oral BID   Chlorhexidine Gluconate Cloth  6 each Topical Daily   digoxin  0.125 mg Oral Daily   diltiazem  180 mg Oral Daily   diphenhydrAMINE  25 mg Oral QHS   docusate sodium  200 mg Oral BID   feeding supplement (NEPRO CARB STEADY)  237 mL Oral TID BM   guaiFENesin  1,200 mg Oral BID   insulin aspart  0-20 Units Subcutaneous TID WC   insulin aspart  0-5 Units Subcutaneous QHS   insulin aspart  10 Units Subcutaneous TID WC   insulin glargine-yfgn  14 Units Subcutaneous QHS   methylPREDNISolone (SOLU-MEDROL) injection  40 mg Intravenous Q12H   multivitamin with minerals  1 tablet Oral Daily   mycophenolate  500 mg Oral BID   sodium chloride flush  10-40 mL Intracatheter Q12H   sulfamethoxazole-trimethoprim  1 tablet Oral Q12H    Assessment/Plan:  Acute on chronic hypoxic respiratory failure.  Interstitial lung disease.  Patient on high flow nasal cannula 14  L.  Patient unable to go home well on high flow nasal cannula.  Patient still on high-dose steroids.  Keep using incentive spirometer and flutter valve.  Ambulate with physical therapy as much as possible. Type 2 diabetes mellitus with hypoglycemia.  We will decrease glargine insulin down to 14 units at night and decrease the amount of sliding scale. Sepsis on admission has resolved.  Completed antibiotics. Paroxysmal atrial fibrillation on Cardizem digoxin and Eliquis Elevated troponin secondary to demand ischemia Thrombocytosis has resolved Prior history of pulmonary embolism on Eliquis History of follicular lymphoma        Code Status:     Code Status Orders  (From admission, onward)           Start     Ordered    01/13/21 1831  Full code  Continuous        01/13/21 1831           Code Status History     Date Active Date Inactive Code Status Order ID Comments User Context   07/29/2019 1447 08/01/2019 1954 Full Code [id number]  Ivor Costa, MD ED   01/29/2019 1601 02/01/2019 1733 Full Code [id number]  Nicholes Mango, MD ED      Advance Directive Documentation    [place] Most Recent Value  Type of Advance Directive Living will, Healthcare Power of Attorney  Pre-existing out of facility DNR order (yellow form or pink MOST form) --  "MOST" Form in Place? --      Family Communication: Patient declined Disposition Plan: Status is: Inpatient  Dispo: The patient is from: Home              Anticipated d/c is to: Home versus rehab depending on clinical course              Patient currently still on high flow nasal cannula and able to go home with high flow nasal cannula   Difficult to place patient.  No.  Time spent: 27 minutes  [place]

## 2021-01-28 ENCOUNTER — Inpatient Hospital Stay: Payer: BC Managed Care – PPO

## 2021-01-28 DIAGNOSIS — I480 Paroxysmal atrial fibrillation: Secondary | ICD-10-CM | POA: Diagnosis not present

## 2021-01-28 DIAGNOSIS — J849 Interstitial pulmonary disease, unspecified: Secondary | ICD-10-CM | POA: Diagnosis not present

## 2021-01-28 DIAGNOSIS — C8290 Follicular lymphoma, unspecified, unspecified site: Secondary | ICD-10-CM

## 2021-01-28 DIAGNOSIS — J9621 Acute and chronic respiratory failure with hypoxia: Secondary | ICD-10-CM | POA: Diagnosis not present

## 2021-01-28 DIAGNOSIS — E11649 Type 2 diabetes mellitus with hypoglycemia without coma: Secondary | ICD-10-CM | POA: Diagnosis not present

## 2021-01-28 LAB — GLUCOSE, CAPILLARY
Glucose-Capillary: 191 mg/dL — ABNORMAL HIGH (ref 70–99)
Glucose-Capillary: 242 mg/dL — ABNORMAL HIGH (ref 70–99)
Glucose-Capillary: 187 mg/dL — ABNORMAL HIGH (ref 70–99)
Glucose-Capillary: 220 mg/dL — ABNORMAL HIGH (ref 70–99)

## 2021-01-28 LAB — SEDIMENTATION RATE: Sed Rate: 5 mm/hr (ref 0–20)

## 2021-01-28 LAB — C-REACTIVE PROTEIN: CRP: 0.5 mg/dL (ref <1.0)

## 2021-01-28 MED ORDER — METHYLPREDNISOLONE SODIUM SUCC 40 MG IJ SOLR
20.0000 mg | Freq: Two times a day (BID) | INTRAMUSCULAR | Status: DC
  Administered 2021-01-28 – 2021-01-29 (×2): 20 mg via INTRAVENOUS
  Filled 2021-01-28 (×2): qty 1

## 2021-01-28 NOTE — Progress Notes (Signed)
Pulmonary Medicine          Date: 01/28/2021,   MRN# [id number] Edward Atkinson 09/26/1960     AdmissionWeight: 63.5 kg                 CurrentWeight: 74.4 kg   Referring physician: Dr Roderic Palau   CHIEF COMPLAINT:   Acute on chronic hypoxemic respiratory failure   HISTORY OF PRESENT ILLNESS   60 yo M w/hx of chronic hypoxemia and ILD post COVID19, T2DM, PE on eliquis, Lymphoma s/p Rituxan which is completed.  Came in due to worsening SOB and DOE specifically mild exertional defacation/urination with feelings of presyncope and severe dyspnea which started to get worse post tapering from steroid appx few days prior to onset of symptom progression.  He was placed on solumedrol and reported mild improvement.  He is on 10L /min [place] during my evaluation.  He had CT chest done with PE protocol and noted to have bilateral GGO infiltrates worse compared to June study. PCCM consultation for further evaluation and management.     01/17/21-  patient seems to be slighly clinically worse post reduction of IV steroids yesterday indicative of ongoing ILD exacerbation, thus far infectious workup is negative but inflammatory biomarkers are highly elevated also pointing towards ILD exacerbation.  There is new CXR in process this morning for interval changes. We discussed increasing steroids slightly and giving more time.  I will also initiate steroid sparing immunomodulation with BID cellcept as patient will likely need therapy for prolonged time period.  Compensatory tachycardia noted appreciate cardiology team.  Sugars with peaks and valleys with adjusted steroid therapy.  Patient with muscle wasting and protein calorie malnutrition but eating better this am.  01/18/21- patient is on HFNC at 60%, feels better, has not been able to tolerate BIPAP.  Wife is at bedside.  BP is borderline low MAP 76.  He has negative infectious workup and thus far ILD exacerbation seems to be main differential. He  continues to have peaks and valleys in blood glucose.  We started steroid sparing regimen with cellcept bid 250 mg and will increase dose as we taper off prednisone.  Remains tachyarrtythmic but improved. Appreciate everyone involved.     01/19/21- patient seen and examined at bedside.  Wife present during evaluation.  Ow HFNC weaned to 41%/45L/MIN.  CRP is still up, have increased cellcept and reducing solumedrol.  Patient felt stronger physically and was able to get OOB to chair. Cough is less now.  He is non-labored with breathing no accessory mm use today. He had dark stools with reduced hb, FOBT ordred.  Haptoglobin is elevated. Pathology slide review ordered.   01/20/21- patient is essentially unchanged from yesterday from pulmonary perspective. CXR also unchanged,  bloodwork with reduction in CRP.  Plan to wean O2 and continue OOB with PT/OT,  Continue current steroid dose and Cellcept.   01/21/21- patient is stable on HFNC.  He had small set back with increased O2 req after exerting himself in bathroom.  HFNC from 41/45 to 54/45.   He diuresed very well with lasix 20 bid >2L urine in past 24h.  Tachycardia is improved. He does not feel weaker and infact states he feels slowly improved.   01/22/21- I spoke to patient regarding goals of care and recommend DNR.  He is agreeable but I asked him to discuss with wife and family out of respect and we will revisit tommorow.  He states he is feeling better  and CRP is trending down but he remains on HFNC 60/40.  He was able to get OOB today and walked to bathroom but experienced mild desaturation with quick recovery "not much issues".      01/23/21- patient is still on high flow nasal canula , he is on 56%/45L.  We reviewed code status and he is still in discussion with family regarding DNR.   We discussed lung transplant per family request and this may be a future option if he is strong enough and recovers.  I have increased insluin TID to 20 from 15 and  increased bactrim to bid SS   01/24/21- patient weaned on HFNC to 49/45, he was able to use BIPAP overnight thinks its helping.  CXR this AM with interval improvement. Will keep on current dose of meds.  Repeat CRP in am.  Last BM yesterday well formed no blood, eating good, urine output very good on 20 bid lasix. Getting OOB with  transient desaturation.  Overall slight improvement.    01/25/21- patient has weaned down to 35%/40L/min.  He remains full code. Reviewed medical plan with Dr Jimmye Norman today.  Reduced steroids from 50 BID to 40 BID and also reduced insulin slightly.    01/26/21- patient is further weaned to 15L humidified HF nasal canula.  He did PT ROM exercises with mild desaturation. CXR today with expiratory image unable to see much interval changes. Patient reports improvement clinically. Plan to continue current regimen. Sugars slightly high due to steroids, will wean steroids soon. Continue cellcept as current 500 bid  01/27/21- patient is relatively same as yesterday, his O2 is weaned down to 14L.  He remains on IV solumedrol at 50 bid, we did discuss weaning this further to 40 BID. Will keep cellcept at current dose 500 bid.  His ankles have been down in seated position and are edematous on examination. He continues to participate with PT and feels good subjectively. We discussed starting dose of lasix for pedal edema.    01/28/21- patient examined at bedside, s/p PT/OT session with slight dyspnea.  He was weaned to 10L O2 but then went back up to 13 post PT due to increased WOB.  He has complete resolution of LE edema and appears to be euvolemic at this point. We discussed reducing soluemdrol today to 20 bid.  I reviewed plan with Dr Leslye Peer today.  PAST MEDICAL HISTORY   Past Medical History:  Diagnosis Date  . Chronic respiratory failure with hypoxia (HCC)    2L [place] continuous  . COVID-19   . Diabetes mellitus without complication ([place])   . Diverticulitis   . Follicular  lymphoma of intra-abdominal lymph nodes ([place]) 02/15/2019  . ILD (interstitial lung disease) ([place])      SURGICAL HISTORY   Past Surgical History:  Procedure Laterality Date  . CHOLECYSTECTOMY    . COLON RESECTION     DUE TO DIVERTICULITIS  . FLEXIBLE BRONCHOSCOPY N/A 12/02/2019   Procedure: FLEXIBLE BRONCHOSCOPY;  Surgeon: Ottie Glazier, MD;  Location: [facility] ORS;  Service: Thoracic;  Laterality: N/A;  . PORTA CATH INSERTION N/A 03/11/2019   Procedure: PORTA CATH INSERTION;  Surgeon: Algernon Huxley, MD;  Location: [place];  Service: Cardiovascular;  Laterality: N/A;  . PULMONARY THROMBECTOMY N/A 01/29/2019   Procedure: PULMONARY THROMBECTOMY;  Surgeon: Algernon Huxley, MD;  Location: [place] [facility];  Service: Cardiovascular;  Laterality: N/A;     FAMILY HISTORY   Family History  Problem Relation Age  of Onset  . Diabetes Brother      SOCIAL HISTORY   Social History   Tobacco Use  . Smoking status: Never  . Smokeless tobacco: Never  Vaping Use  . Vaping Use: Never used  Substance Use Topics  . Alcohol use: Yes    Alcohol/week: 0.0 - 2.0 standard drinks    Comment: occasional  . Drug use: Never     MEDICATIONS    Home Medication:    Current Medication:  Current Facility-Administered Medications:  .  acetaminophen (TYLENOL) tablet 650 mg, 650 mg, Oral, Q6H PRN, 650 mg at 01/18/21 1411 **OR** acetaminophen (TYLENOL) suppository 650 mg, 650 mg, Rectal, Q6H PRN, Howerter, Justin B, DO .  acyclovir (ZOVIRAX) 200 MG capsule 400 mg, 400 mg, Oral, BID, Kathie Dike, MD, 400 mg at 01/28/21 0943 .  apixaban (ELIQUIS) tablet 5 mg, 5 mg, Oral, BID, Beers, Shanon Brow, RPH, 5 mg at 01/28/21 0942 .  Chlorhexidine Gluconate Cloth 2 % PADS 6 each, 6 each, Topical, Daily, Kathie Dike, MD, 6 each at 01/27/21 0912 .  digoxin (LANOXIN) tablet 0.125 mg, 0.125 mg, Oral, Daily, Memon, Jolaine Artist, MD, 0.125 mg at 01/28/21 0943 .  diltiazem (CARDIZEM CD) 24 hr capsule 180  mg, 180 mg, Oral, Daily, Paraschos, Alexander, MD, 180 mg at 01/28/21 0942 .  diltiazem (CARDIZEM) injection 10 mg, 10 mg, Intravenous, Q6H PRN, Wyvonnia Dusky, MD, 10 mg at 01/24/21 2328 .  diphenhydrAMINE (BENADRYL) capsule 25 mg, 25 mg, Oral, QHS, Wyvonnia Dusky, MD, 25 mg at 01/27/21 2134 .  docusate sodium (COLACE) capsule 200 mg, 200 mg, Oral, BID, Wyvonnia Dusky, MD, 200 mg at 01/28/21 0941 .  feeding supplement (NEPRO CARB STEADY) liquid 237 mL, 237 mL, Oral, TID BM, Memon, Jehanzeb, MD, Last Rate: 0 mL/hr at 01/21/21 2130, 237 mL at 01/28/21 1329 .  guaiFENesin (MUCINEX) 12 hr tablet 1,200 mg, 1,200 mg, Oral, BID, Kathie Dike, MD, 1,200 mg at 01/28/21 0942 .  guaiFENesin-dextromethorphan (ROBITUSSIN DM) 100-10 MG/5ML syrup 5 mL, 5 mL, Oral, Q4H PRN, Kathie Dike, MD, 5 mL at 01/17/21 0648 .  insulin aspart (novoLOG) injection 0-20 Units, 0-20 Units, Subcutaneous, TID WC, Kathie Dike, MD, 7 Units at 01/28/21 1326 .  insulin aspart (novoLOG) injection 0-5 Units, 0-5 Units, Subcutaneous, QHS, Kathie Dike, MD, 3 Units at 01/27/21 2133 .  insulin aspart (novoLOG) injection 10 Units, 10 Units, Subcutaneous, TID WC, Loletha Grayer, MD, 10 Units at 01/28/21 1326 .  insulin glargine-yfgn (SEMGLEE) injection 14 Units, 14 Units, Subcutaneous, QHS, Loletha Grayer, MD, 14 Units at 01/27/21 2132 .  levalbuterol (XOPENEX) nebulizer solution 1.25 mg, 1.25 mg, Nebulization, Q4H PRN, Howerter, Justin B, DO, 1.25 mg at 01/17/21 2011 .  methylPREDNISolone sodium succinate (SOLU-MEDROL) 40 mg/mL injection 20 mg, 20 mg, Intravenous, Q12H, Lidie Glade, MD .  multivitamin with minerals tablet 1 tablet, 1 tablet, Oral, Daily, Kathie Dike, MD, 1 tablet at 01/28/21 0942 .  mycophenolate (CELLCEPT) capsule 500 mg, 500 mg, Oral, BID, Ottie Glazier, MD, 500 mg at 01/28/21 0943 .  sodium chloride flush (NS) 0.9 % injection 10-40 mL, 10-40 mL, Intracatheter, Q12H, Kathie Dike,  MD, 10 mL at 01/28/21 0948 .  sodium chloride flush (NS) 0.9 % injection 10-40 mL, 10-40 mL, Intracatheter, PRN, Kathie Dike, MD, 10 mL at 01/24/21 0915 .  sulfamethoxazole-trimethoprim (BACTRIM) 400-80 MG per tablet 1 tablet, 1 tablet, Oral, Q12H, Ottie Glazier, MD, 1 tablet at 01/28/21 5364  Facility-Administered Medications Ordered in Other Encounters:  .  heparin lock flush 100 unit/mL, 500 Units, Intravenous, Once, Earlie Server, MD    ALLERGIES   Penicillins     REVIEW OF SYSTEMS    Review of Systems:  Gen:  Denies  fever, sweats, chills weigh loss  HEENT: Denies blurred vision, double vision, ear pain, eye pain, hearing loss, nose bleeds, sore throat Cardiac:  No dizziness, chest pain or heaviness, chest tightness,edema Resp:   Denies cough or sputum porduction, shortness of breath,wheezing, hemoptysis,  Gi: Denies swallowing difficulty, stomach pain, nausea or vomiting, diarrhea, constipation, bowel incontinence Gu:  Denies bladder incontinence, burning urine Ext:   Denies Joint pain, stiffness or swelling Skin: Denies  skin rash, easy bruising or bleeding or hives Endoc:  Denies polyuria, polydipsia , polyphagia or weight change Psych:   Denies depression, insomnia or hallucinations   Other:  All other systems negative   VS: BP 119/75 (BP Location: Right Arm)   Pulse 81   Temp 98 F (36.7 C)   Resp 17   Ht 6' (1.829 m)   Wt 74.4 kg   SpO2 95%   BMI 22.24 kg/m      PHYSICAL EXAM    GENERAL:NAD, no fevers, chills, no weakness no fatigue HEAD: Normocephalic, atraumatic.  EYES: Pupils equal, round, reactive to light. Extraocular muscles intact. No scleral icterus.  MOUTH: Moist mucosal membrane. Dentition intact. No abscess noted.  EAR, NOSE, THROAT: Clear without exudates. No external lesions.  NECK: Supple. No thyromegaly. No nodules. No JVD.  PULMONARY: Bilateral rhonchi improved from previous CARDIOVASCULAR: S1 and S2. Regular rate and rhythm. No  murmurs, rubs, or gallops. No edema. Pedal pulses 2+ bilaterally.  GASTROINTESTINAL: Soft, nontender, nondistended. No masses. Positive bowel sounds. No hepatosplenomegaly.  MUSCULOSKELETAL: No swelling, clubbing, or edema. Range of motion full in all extremities.  NEUROLOGIC: Cranial nerves II through XII are intact. No gross focal neurological deficits. Sensation intact. Reflexes intact.  SKIN: No ulceration, lesions, rashes, or cyanosis. Skin warm and dry. Turgor intact.  PSYCHIATRIC: Mood, affect within normal limits. The patient is awake, alert and oriented x 3. Insight, judgment intact.       IMAGING    CT Angio Chest PE W/Cm &/Or Wo Cm  Result Date: 01/13/2021 CLINICAL DATA:  Respiratory distress, hypoxia EXAM: CT ANGIOGRAPHY CHEST WITH CONTRAST TECHNIQUE: Multidetector CT imaging of the chest was performed using the standard protocol during bolus administration of intravenous contrast. Multiplanar CT image reconstructions and MIPs were obtained to evaluate the vascular anatomy. CONTRAST:  38m OMNIPAQUE IOHEXOL 350 MG/ML SOLN COMPARISON:  10/26/2020, 01/13/2021 FINDINGS: Cardiovascular: This is a technically adequate evaluation of the pulmonary vasculature. No filling defects or pulmonary emboli. The heart is unremarkable without pericardial effusion. No evidence of thoracic aortic aneurysm or dissection. Right chest wall port via internal jugular approach tip within the superior vena cava. Mediastinum/Nodes: No pathologic adenopathy within the mediastinum, hila, or axilla. Thyroid, trachea, and esophagus are unremarkable. Lungs/Pleura: There has been interval progression of the multifocal ground-glass airspace disease seen previously. Changes are most pronounced within the dependent lower lobes. No effusion or pneumothorax. The central airways are patent, with persistent bronchiectasis. Upper Abdomen: No acute abnormality. Musculoskeletal: No acute or destructive bony lesions. Reconstructed  images demonstrate no additional findings. Review of the MIP images confirms the above findings. IMPRESSION: 1. No evidence of pulmonary embolus. 2. Progressive multifocal bilateral ground-glass airspace disease, greatest at the lung bases. The differential diagnosis would include progressive postinflammatory scarring and fibrosis, multifocal atypical pneumonia, or hypersensitivity/drug toxicity. 3.  Stable bronchiectasis. Electronically Signed   By: Randa Ngo M.D.   On: 01/13/2021 17:13   DG Chest Port 1 View  Result Date: 01/26/2021 CLINICAL DATA:  Shortness of breath, abnormal chest x-ray EXAM: PORTABLE CHEST 1 VIEW COMPARISON:  01/24/2021 FINDINGS: Right Port-A-Cath remains in place, unchanged. Low lung volumes with bilateral airspace opacities, most pronounced in the left perihilar and lower lobe, unchanged since prior study. No visible effusions or pneumothorax. Heart is normal size. IMPRESSION: Low lung volumes with bilateral airspace disease, left-greater-than-right. No significant change. Electronically Signed   By: Rolm Baptise M.D.   On: 01/26/2021 09:42   DG Chest Port 1 View  Result Date: 01/24/2021 CLINICAL DATA:  Follow-up multifocal pneumonia. EXAM: PORTABLE CHEST 1 VIEW COMPARISON:  01/22/2021 and older exams. FINDINGS: Mild interval decrease in the airspace lung opacities, most evident on the left. Lung volumes remain low. No new lung abnormalities. No visualized pleural effusion.  No pneumothorax. Right internal jugular Port-A-Cath is stable. IMPRESSION: 1. Mild interval improvement in lung aeration with decreased airspace lung opacities, most notably on the left. Electronically Signed   By: Lajean Manes M.D.   On: 01/24/2021 07:57   DG Chest Port 1 View  Result Date: 01/22/2021 CLINICAL DATA:  Hypoxia, shortness of breath. EXAM: PORTABLE CHEST 1 VIEW COMPARISON:  January 20, 2021. FINDINGS: Stable cardiomediastinal silhouette. Right internal jugular Port-A-Cath is unchanged in  position. Hypoinflation of the lungs is noted. Stable bilateral lung opacities are noted, left greater than right, most consistent with multifocal pneumonia. Bony thorax is unremarkable. IMPRESSION: Hypoinflation of the lungs. Stable bilateral lung opacities are noted, left greater than right, most consistent with multifocal pneumonia. Electronically Signed   By: Marijo Conception M.D.   On: 01/22/2021 19:26   DG Chest Port 1 View  Result Date: 01/20/2021 CLINICAL DATA:  Pneumonitis EXAM: PORTABLE CHEST 1 VIEW COMPARISON:  01/19/2021 FINDINGS: Shallow inspiration. Patchy infiltrates in the left lung and right lung base, similar to prior study and likely multifocal pneumonia. No pleural effusions. No pneumothorax. Mediastinal contours appear intact. Heart size is normal. Power port type central venous catheter on the right with tip over the cavoatrial junction region. Surgical clips in the right upper quadrant. IMPRESSION: Shallow inspiration. Patchy infiltrates in both lungs, likely multifocal pneumonia, without change. Electronically Signed   By: Lucienne Capers M.D.   On: 01/20/2021 03:56   DG Chest Port 1 View  Result Date: 01/19/2021 CLINICAL DATA:  Abnormality on previous chest x-ray in a 60 year old male. EXAM: PORTABLE CHEST 1 VIEW COMPARISON:  CT from January 13, 2021, chest x-ray January 18, 2021. FINDINGS: RIGHT-sided Port-A-Cath terminates at the caval to atrial junction. Trachea is midline. Cardiomediastinal contours are stable. Interstitial and airspace opacities throughout the chest worse in the LEFT mid to upper chest, RIGHT upper chest lung bases bilaterally with similar appearance. On limited assessment there is no acute skeletal process. EKG leads project over the chest. IMPRESSION: No interval change in the appearance of the chest since previous imaging with continued decreased lung volumes and patchy bilateral opacities. Electronically Signed   By: Zetta Bills M.D.   On: 01/19/2021  07:59   DG Chest Port 1 View  Result Date: 01/18/2021 CLINICAL DATA:  Respiratory distress EXAM: PORTABLE CHEST 1 VIEW COMPARISON:  01/17/2021 FINDINGS: Cardiac shadow is stable. Right-sided chest wall port is again seen. Lungs are hypoinflated. Patchy airspace opacities again identified in both lungs left greater than right but stable from the prior exam. IMPRESSION:  Patchy airspace opacities bilaterally stable from the prior exam. Electronically Signed   By: Inez Catalina M.D.   On: 01/18/2021 17:24   DG Chest Port 1 View  Result Date: 01/17/2021 CLINICAL DATA:  Worsening shortness of breath. EXAM: PORTABLE CHEST 1 VIEW COMPARISON:  01/14/2021 FINDINGS: 0826 hours. Low volume film. Patchy airspace disease again noted in both lungs, left greater than right. Cardiopericardial silhouette is at upper limits of normal for size. Right Port-A-Cath again noted. Telemetry leads overlie the chest. IMPRESSION: Low volume film with patchy bilateral airspace disease, left greater than right. No substantial change. Electronically Signed   By: Misty Stanley M.D.   On: 01/17/2021 08:53   DG Chest Port 1 View  Result Date: 01/14/2021 CLINICAL DATA:  Shortness of breath.  History of cancer.  Diabetes. EXAM: PORTABLE CHEST 1 VIEW.  Patient is rotated. COMPARISON:  Chest x-ray 01/13/2021, CT chest 01/13/2021 FINDINGS: Accessed right chest wall Port-A-Cath in stable position. The heart and mediastinal contours are within normal limits. Low lung volumes with persistent diffuse patchy airspace opacities. No pleural effusion. No pneumothorax. No acute osseous abnormality. Right upper quadrant surgical clips. IMPRESSION: Low lung volumes with persistent diffuse patchy airspace opacities. Electronically Signed   By: Iven Finn M.D.   On: 01/14/2021 21:08   DG Chest Port 1 View  Result Date: 01/13/2021 CLINICAL DATA:  Shortness of breath EXAM: PORTABLE CHEST 1 VIEW COMPARISON:  Chest radiograph 07/29/2019 FINDINGS: There  is a right chest wall port with the tip terminating in the lower SVC/cavoatrial junction. The cardiomediastinal silhouette is within normal limits. Lung volumes are low with unchanged asymmetric elevation of the right hemidiaphragm. There are increased interstitial markings with diffuse reticular opacities throughout both lungs. There is no focal consolidation. There is no significant pleural effusion. There is no pneumothorax. There is no acute osseous abnormality. There is gaseous distention of the stomach and large bowel in the right upper quadrant. IMPRESSION: Low lung volumes with suspected mild pulmonary interstitial edema. Atypical/viral infection could have a similar appearance. Electronically Signed   By: Valetta Mole M.D.   On: 01/13/2021 14:24      ASSESSMENT/PLAN   Acute hypoxemic respiratory failure Due to acute exacerbation of interstitial lung disease  - present on admission  - COVID19 negative   - supplemental O2 during my evaluation 10l/min>>8L>>HFNC  -Respiratory viral panel-negative  -serum fungitell-negative  -legionella ab-negative  -nasal MRSA PCR-negative -Procalcitonin trend reviewed -strep pneumoniae ur AG -Histoplasma Ur Ag-negative -sputum resp cultures -AFB sputum expectorated specimen -sputum cytology  -completed  Cefepime course for cap -Reduced Solumedrol to 40 BID - 01/15/21>>50 BID>>40 BID>>30 BID>>20 BID -reviewed pertinent imaging with patient today - ESR and CRP are elevated but improving -PT/OT for d/c planning  -please encourage patient to use incentive spirometer few times each hour while hospitalized.   -continue Cellcept 250 bid >>500 bid  -dcd lasix 01/28/21 -UOP is adequate -Discussed goal of care - recommend DNR patient is in agreement but still discussing with wife.    Acute blood loss anemia-resolved    - noted Hb trending down from 14 to 10 -    -fecal occult blood test    - may be due to GI bleed due to eliquis - monitor for grossly  visible bleeding and stable vital sings    Thank you for allowing me to participate in the care of this patient.   Patient/Family are satisfied with care plan and all questions have been answered.   This  document was prepared using Systems analyst and may include unintentional dictation errors.     Ottie Glazier, M.D.  Division of [place]

## 2021-01-28 NOTE — Progress Notes (Signed)
Patient ID: Edward Atkinson, male   DOB: 06/20/1960, 60 y.o.   MRN: [id number] [facility] PROGRESS NOTE  AIRIK GOODLIN BTD:[id number] DOB: 10-Jan-1961 DOA: 01/13/2021 PCP: Rusty Aus, MD  HPI/Subjective: Patient feels okay.  Offers no complaints.  When I came in the room he was taking shallow breaths.  I advised to take slow deep breaths.  Patient admitted 15 days ago with shortness of breath and acute exacerbation of interstitial lung disease.  Objective: Vitals:   01/28/21 1245 01/28/21 1248  BP:    Pulse:    Resp:    Temp:    SpO2: 97% 95%    Intake/Output Summary (Last 24 hours) at 01/28/2021 1556 Last data filed at 01/28/2021 1330 Gross per 24 hour  Intake 1680 ml  Output 1800 ml  Net -120 ml   Filed Weights   01/26/21 0436 01/27/21 0457 01/28/21 0421  Weight: 70 kg 73 kg 74.4 kg    ROS: Review of Systems  Respiratory:  Positive for cough and shortness of breath.   Cardiovascular:  Negative for chest pain.  Gastrointestinal:  Negative for abdominal pain, nausea and vomiting.  Exam: Physical Exam HENT:     Head: Normocephalic.     Mouth/Throat:     Pharynx: No oropharyngeal exudate.  Eyes:     General: Lids are normal.     Conjunctiva/sclera: Conjunctivae normal.  Cardiovascular:     Rate and Rhythm: Normal rate and regular rhythm.     Heart sounds: S1 normal and S2 normal. Murmur heard.  Systolic murmur is present with a grade of 2/6.  Pulmonary:     Breath sounds: Decreased air movement present. Examination of the right-middle field reveals decreased breath sounds and wheezing. Examination of the left-middle field reveals decreased breath sounds and wheezing. Examination of the right-lower field reveals decreased breath sounds and rhonchi. Examination of the left-lower field reveals decreased breath sounds and rhonchi. Decreased breath sounds, wheezing and rhonchi present. No rales.  Abdominal:     Palpations: Abdomen is soft.     Tenderness: There is no  abdominal tenderness.  Musculoskeletal:     Right lower leg: Swelling present.     Left lower leg: Swelling present.  Skin:    General: Skin is warm.     Findings: No rash.  Neurological:     Mental Status: He is alert and oriented to person, place, and time.      Scheduled Meds:  acyclovir  400 mg Oral BID   apixaban  5 mg Oral BID   Chlorhexidine Gluconate Cloth  6 each Topical Daily   digoxin  0.125 mg Oral Daily   diltiazem  180 mg Oral Daily   diphenhydrAMINE  25 mg Oral QHS   docusate sodium  200 mg Oral BID   feeding supplement (NEPRO CARB STEADY)  237 mL Oral TID BM   guaiFENesin  1,200 mg Oral BID   insulin aspart  0-20 Units Subcutaneous TID WC   insulin aspart  0-5 Units Subcutaneous QHS   insulin aspart  10 Units Subcutaneous TID WC   insulin glargine-yfgn  14 Units Subcutaneous QHS   methylPREDNISolone (SOLU-MEDROL) injection  20 mg Intravenous Q12H   multivitamin with minerals  1 tablet Oral Daily   mycophenolate  500 mg Oral BID   sodium chloride flush  10-40 mL Intracatheter Q12H   sulfamethoxazole-trimethoprim  1 tablet Oral Q12H     Assessment/Plan:  Acute on chronic hypoxic respiratory failure.  This morning  was on 10 L high flow nasal cannula.  Patient still desaturating with minimal activity with physical therapy.  Currently on 12 L high flow nasal cannula.  Keep using incentive spirometry and ambulate anytime somebody comes into the room. Interstitial lung disease exacerbation on CellCept and Solu-Medrol as per pulmonary. Type 2 diabetes mellitus with hypoglycemic episode the other day.  Better on decreased glargine insulin and decreased amount of sliding scale. Sepsis on admission has resolved.  Completed antibiotics. Paroxysmal atrial fibrillation on Eliquis for anticoagulation Cardizem and digoxin for rate control. Elevated troponin secondary to demand ischemia Thrombocytosis has resolved Prior history of pulmonary embolism on Eliquis History of  follicular lymphoma      Code Status:     Code Status Orders  (From admission, onward)           Start     Ordered   01/13/21 1831  Full code  Continuous        01/13/21 1831           Code Status History     Date Active Date Inactive Code Status Order ID Comments User Context   07/29/2019 1447 08/01/2019 1954 Full Code [id number]  Ivor Costa, MD ED   01/29/2019 1601 02/01/2019 1733 Full Code [id number]  Nicholes Mango, MD ED      Advance Directive Documentation    Amber Most Recent Value  Type of Advance Directive Living will, Healthcare Power of Attorney  Pre-existing out of facility DNR order (yellow form or pink MOST form) --  "MOST" Form in Place? --      Family Communication: Declined Disposition Plan: Status is: Inpatient  Dispo: The patient is from: Home              Anticipated d/c is to: Home              Patient currently still on high flow nasal cannula and not stable for discharge.   Difficult to place patient.  No.  Consultants: Pulmonary  Time spent: 27 minutes, case discussed with pulmonary  Loletha Grayer  [facility]

## 2021-01-28 NOTE — Progress Notes (Signed)
Physical Therapy Treatment Patient Details Name: Edward Atkinson MRN: [id number] DOB: March 18, 1961 Today's Date: 01/28/2021   History of Present Illness Pt is a 61 y.o. male with medical history significant for chronic hypoxic respiratory failure on 2 L continuous nasal cannula in the setting of interstitial lung disease, type 2 diabetes mellitus, pulmonary embolism in 2020 now chronically anticoagulated on Eliquis, who arrived to ED with shortness of breath. MD assessment includes:  acute on chronic hypoxic respiratory failure with hypoxia, possible sepsis due to bilateral pneumonia, rapid atrial fibrillation, anemia, and mild elevation of troponin with suspected demand ischemia.    PT Comments    Pt continues to be highly motivated to participate with therapy however, continues to be limited due to oxygen desaturation with exertion. At rest, pt was on 10L HFNC and at 85% SpO2. Increased to 12L and cued for pursed lip breathing without improvement in SpO2. Increased to 15L and improved to 90% SpO2. With light supine exercises, pt dropped to 80% SpO2 on 15L and required >5 minutes to recover to 88%. Lowest SpO2 reading was 74% after taking sip of water. Nurse and Respiratory Therapist notified of desaturation with exertion. Unable to progress mobility due to oxygen desaturation. Will continue to work with patient and monitor oxygen requirements with activity.    Recommendations for follow up therapy are one component of a multi-disciplinary discharge planning process, led by the attending physician.  Recommendations may be updated based on patient status, additional functional criteria and insurance authorization.  Follow Up Recommendations  Home health PT;Supervision for mobility/OOB     Equipment Recommendations  Rolling walker with 5" wheels;3in1 (PT)    Recommendations for Other Services       Precautions / Restrictions Precautions Precautions: Fall Restrictions Weight Bearing  Restrictions: No     Mobility  Bed Mobility Overal bed mobility: Independent         Transfers      General transfer comment: Deferred due to desaturation with supine exercises  Ambulation/Gait             General Gait Details: Deferred due to desaturation with supine exercises   Stairs             Wheelchair Mobility    Modified Rankin (Stroke Patients Only)       Balance Overall balance assessment: Needs assistance     Sitting balance - Comments: Deferred due to desaturation with supine exercises       Standing balance comment: Deferred due to desaturation with supine exercises         Cognition Arousal/Alertness: Awake/alert Behavior During Therapy: WFL for tasks assessed/performed Overall Cognitive Status: Within Functional Limits for tasks assessed           Exercises Other Exercises Other Exercises: Completed supine exercises on 15L O2 with consistent drop to low 80s requiring 4 minutes to recover to 88%. Completed supine heel slides 1 x 10, ankle pumps 1 x 10, and incentive inspirometer x 5.    General Comments        Pertinent Vitals/Pain Pain Assessment: No/denies pain    Home Living                      Prior Function            PT Goals (current goals can now be found in the care plan section) Acute Rehab PT Goals Patient Stated Goal: To get my strength back PT Goal Formulation: With patient Time  For Goal Achievement: 02/01/21 Potential to Achieve Goals: Good Progress towards PT goals: Progressing toward goals    Frequency    Min 2X/week      PT Plan Current plan remains appropriate    Co-evaluation              AM-PAC PT "6 Clicks" Mobility   Outcome Measure  Help needed turning from your back to your side while in a flat bed without using bedrails?: None Help needed moving from lying on your back to sitting on the side of a flat bed without using bedrails?: None Help needed moving to and  from a bed to a chair (including a wheelchair)?: A Little Help needed standing up from a chair using your arms (e.g., wheelchair or bedside chair)?: A Little Help needed to walk in hospital room?: A Lot Help needed climbing 3-5 steps with a railing? : A Lot 6 Click Score: 18    End of Session Equipment Utilized During Treatment: Oxygen Activity Tolerance: Patient tolerated treatment well Patient left: in bed;with call bell/phone within reach;Other (comment) (Chair positoin) Nurse Communication: Mobility status;Other (comment) (Oxygen desaturation) PT Visit Diagnosis: Muscle weakness (generalized) (M62.81);Difficulty in walking, not elsewhere classified (R26.2)     Time: 9038-3338 PT Time Calculation (min) (ACUTE ONLY): 24 min  Charges:  $Therapeutic Exercise: 23-37 mins                     Andrey Campanile, SPT   Andrey Campanile 01/28/2021, 2:12 PM

## 2021-01-29 DIAGNOSIS — E11649 Type 2 diabetes mellitus with hypoglycemia without coma: Secondary | ICD-10-CM | POA: Diagnosis not present

## 2021-01-29 DIAGNOSIS — J849 Interstitial pulmonary disease, unspecified: Secondary | ICD-10-CM | POA: Diagnosis not present

## 2021-01-29 DIAGNOSIS — J9621 Acute and chronic respiratory failure with hypoxia: Secondary | ICD-10-CM | POA: Diagnosis not present

## 2021-01-29 DIAGNOSIS — I480 Paroxysmal atrial fibrillation: Secondary | ICD-10-CM | POA: Diagnosis not present

## 2021-01-29 LAB — C-REACTIVE PROTEIN: CRP: <0.5 mg/dL (ref <1.0)

## 2021-01-29 LAB — GLUCOSE, CAPILLARY
Glucose-Capillary: 233 mg/dL — ABNORMAL HIGH (ref 70–99)
Glucose-Capillary: 279 mg/dL — ABNORMAL HIGH (ref 70–99)
Glucose-Capillary: 257 mg/dL — ABNORMAL HIGH (ref 70–99)
Glucose-Capillary: 82 mg/dL (ref 70–99)

## 2021-01-29 MED ORDER — SIMETHICONE 80 MG PO CHEW
80.0000 mg | CHEWABLE_TABLET | Freq: Four times a day (QID) | ORAL | Status: DC | PRN
  Administered 2021-01-29: 80 mg via ORAL
  Filled 2021-01-29 (×4): qty 1

## 2021-01-29 MED ORDER — PREDNISONE 50 MG PO TABS
50.0000 mg | ORAL_TABLET | Freq: Every day | ORAL | Status: DC
  Administered 2021-01-30 – 2021-02-06 (×8): 50 mg via ORAL
  Filled 2021-01-29 (×8): qty 1

## 2021-01-29 NOTE — Progress Notes (Signed)
Pulmonary Medicine          Date: 01/29/2021,   MRN# [id number] Edward Atkinson October 25, 1960     AdmissionWeight: 63.5 kg                 CurrentWeight: 72.1 kg   Referring physician: Dr Roderic Palau   CHIEF COMPLAINT:   Acute on chronic hypoxemic respiratory failure   HISTORY OF PRESENT ILLNESS   60 yo M w/hx of chronic hypoxemia and ILD post COVID19, T2DM, PE on eliquis, Lymphoma s/p Rituxan which is completed.  Came in due to worsening SOB and DOE specifically mild exertional defacation/urination with feelings of presyncope and severe dyspnea which started to get worse post tapering from steroid appx few days prior to onset of symptom progression.  He was placed on solumedrol and reported mild improvement.  He is on 10L /min [place] during my evaluation.  He had CT chest done with PE protocol and noted to have bilateral GGO infiltrates worse compared to June study. PCCM consultation for further evaluation and management.     01/17/21-  patient seems to be slighly clinically worse post reduction of IV steroids yesterday indicative of ongoing ILD exacerbation, thus far infectious workup is negative but inflammatory biomarkers are highly elevated also pointing towards ILD exacerbation.  There is new CXR in process this morning for interval changes. We discussed increasing steroids slightly and giving more time.  I will also initiate steroid sparing immunomodulation with BID cellcept as patient will likely need therapy for prolonged time period.  Compensatory tachycardia noted appreciate cardiology team.  Sugars with peaks and valleys with adjusted steroid therapy.  Patient with muscle wasting and protein calorie malnutrition but eating better this am.  01/18/21- patient is on HFNC at 60%, feels better, has not been able to tolerate BIPAP.  Wife is at bedside.  BP is borderline low MAP 76.  He has negative infectious workup and thus far ILD exacerbation seems to be main differential. He  continues to have peaks and valleys in blood glucose.  We started steroid sparing regimen with cellcept bid 250 mg and will increase dose as we taper off prednisone.  Remains tachyarrtythmic but improved. Appreciate everyone involved.     01/19/21- patient seen and examined at bedside.  Wife present during evaluation.  Ow HFNC weaned to 41%/45L/MIN.  CRP is still up, have increased cellcept and reducing solumedrol.  Patient felt stronger physically and was able to get OOB to chair. Cough is less now.  He is non-labored with breathing no accessory mm use today. He had dark stools with reduced hb, FOBT ordred.  Haptoglobin is elevated. Pathology slide review ordered.   01/20/21- patient is essentially unchanged from yesterday from pulmonary perspective. CXR also unchanged,  bloodwork with reduction in CRP.  Plan to wean O2 and continue OOB with PT/OT,  Continue current steroid dose and Cellcept.   01/21/21- patient is stable on HFNC.  He had small set back with increased O2 req after exerting himself in bathroom.  HFNC from 41/45 to 54/45.   He diuresed very well with lasix 20 bid >2L urine in past 24h.  Tachycardia is improved. He does not feel weaker and infact states he feels slowly improved.   01/22/21- I spoke to patient regarding goals of care and recommend DNR.  He is agreeable but I asked him to discuss with wife and family out of respect and we will revisit tommorow.  He states he is feeling better  and CRP is trending down but he remains on HFNC 60/40.  He was able to get OOB today and walked to bathroom but experienced mild desaturation with quick recovery "not much issues".      01/23/21- patient is still on high flow nasal canula , he is on 56%/45L.  We reviewed code status and he is still in discussion with family regarding DNR.   We discussed lung transplant per family request and this may be a future option if he is strong enough and recovers.  I have increased insluin TID to 20 from 15 and  increased bactrim to bid SS   01/24/21- patient weaned on HFNC to 49/45, he was able to use BIPAP overnight thinks its helping.  CXR this AM with interval improvement. Will keep on current dose of meds.  Repeat CRP in am.  Last BM yesterday well formed no blood, eating good, urine output very good on 20 bid lasix. Getting OOB with  transient desaturation.  Overall slight improvement.    01/25/21- patient has weaned down to 35%/40L/min.  He remains full code. Reviewed medical plan with Dr Jimmye Norman today.  Reduced steroids from 50 BID to 40 BID and also reduced insulin slightly.    01/26/21- patient is further weaned to 15L humidified HF nasal canula.  He did PT ROM exercises with mild desaturation. CXR today with expiratory image unable to see much interval changes. Patient reports improvement clinically. Plan to continue current regimen. Sugars slightly high due to steroids, will wean steroids soon. Continue cellcept as current 500 bid  01/27/21- patient is relatively same as yesterday, his O2 is weaned down to 14L.  He remains on IV solumedrol at 50 bid, we did discuss weaning this further to 40 BID. Will keep cellcept at current dose 500 bid.  His ankles have been down in seated position and are edematous on examination. He continues to participate with PT and feels good subjectively. We discussed starting dose of lasix for pedal edema.    01/28/21- patient examined at bedside, s/p PT/OT session with slight dyspnea.  He was weaned to 10L O2 but then went back up to 13 post PT due to increased WOB.  He has complete resolution of LE edema and appears to be euvolemic at this point. We discussed reducing soluemdrol today to 20 bid.  I reviewed plan with Dr Leslye Peer today.  01/29/21- patient examined at bedside.  He had a good day and feels better. We reviewed medical plan and are reducing steroids to PO prednisone starting at 30m daily.   PAST MEDICAL HISTORY   Past Medical History:  Diagnosis Date  .  Chronic respiratory failure with hypoxia (HCC)    2L [place] continuous  . COVID-19   . Diabetes mellitus without complication ([facility]   . Diverticulitis   . Follicular lymphoma of intra-abdominal lymph nodes ([place] 02/15/2019  . ILD (interstitial lung disease) ([facility]      SURGICAL HISTORY   Past Surgical History:  Procedure Laterality Date  . CHOLECYSTECTOMY    . COLON RESECTION     DUE TO DIVERTICULITIS  . FLEXIBLE BRONCHOSCOPY N/A 12/02/2019   Procedure: FLEXIBLE BRONCHOSCOPY;  Surgeon: AOttie Glazier MD;  Location: [facility];  Service: Thoracic;  Laterality: N/A;  . PORTA CATH INSERTION N/A 03/11/2019   Procedure: PORTA CATH INSERTION;  Surgeon: DAlgernon Huxley MD;  [facility];  Service: Cardiovascular;  Laterality: N/A;  . PULMONARY THROMBECTOMY N/A 01/29/2019   Procedure: PULMONARY THROMBECTOMY;  Surgeon: Algernon Huxley, MD;  Location: [place] [facility];  Service: Cardiovascular;  Laterality: N/A;     FAMILY HISTORY   Family History  Problem Relation Age of Onset  . Diabetes Brother      SOCIAL HISTORY   Social History   Tobacco Use  . Smoking status: Never  . Smokeless tobacco: Never  Vaping Use  . Vaping Use: Never used  Substance Use Topics  . Alcohol use: Yes    Alcohol/week: 0.0 - 2.0 standard drinks    Comment: occasional  . Drug use: Never     MEDICATIONS    Home Medication:    Current Medication:  Current Facility-Administered Medications:  .  acetaminophen (TYLENOL) tablet 650 mg, 650 mg, Oral, Q6H PRN, 650 mg at 01/18/21 1411 **OR** acetaminophen (TYLENOL) suppository 650 mg, 650 mg, Rectal, Q6H PRN, Howerter, Justin B, DO .  acyclovir (ZOVIRAX) 200 MG capsule 400 mg, 400 mg, Oral, BID, Kathie Dike, MD, 400 mg at 01/29/21 0928 .  apixaban (ELIQUIS) tablet 5 mg, 5 mg, Oral, BID, Beers, Shanon Brow, RPH, 5 mg at 01/29/21 0630 .  Chlorhexidine Gluconate Cloth 2 % PADS 6 each, 6 each, Topical, Daily, Kathie Dike, MD, 6 each at  01/29/21 1247 .  digoxin (LANOXIN) tablet 0.125 mg, 0.125 mg, Oral, Daily, Memon, Jolaine Artist, MD, 0.125 mg at 01/29/21 0928 .  diltiazem (CARDIZEM CD) 24 hr capsule 180 mg, 180 mg, Oral, Daily, Paraschos, Alexander, MD, 180 mg at 01/29/21 0928 .  diltiazem (CARDIZEM) injection 10 mg, 10 mg, Intravenous, Q6H PRN, Wyvonnia Dusky, MD, 10 mg at 01/24/21 2328 .  diphenhydrAMINE (BENADRYL) capsule 25 mg, 25 mg, Oral, QHS, Wyvonnia Dusky, MD, 25 mg at 01/28/21 2151 .  docusate sodium (COLACE) capsule 200 mg, 200 mg, Oral, BID, Wyvonnia Dusky, MD, 200 mg at 01/29/21 1601 .  feeding supplement (NEPRO CARB STEADY) liquid 237 mL, 237 mL, Oral, TID BM, Memon, Jehanzeb, MD, Last Rate: 0 mL/hr at 01/21/21 2130, 237 mL at 01/29/21 1514 .  guaiFENesin (MUCINEX) 12 hr tablet 1,200 mg, 1,200 mg, Oral, BID, Kathie Dike, MD, 1,200 mg at 01/29/21 0928 .  guaiFENesin-dextromethorphan (ROBITUSSIN DM) 100-10 MG/5ML syrup 5 mL, 5 mL, Oral, Q4H PRN, Kathie Dike, MD, 5 mL at 01/17/21 0648 .  insulin aspart (novoLOG) injection 0-20 Units, 0-20 Units, Subcutaneous, TID WC, Kathie Dike, MD, 11 Units at 01/29/21 1247 .  insulin aspart (novoLOG) injection 0-5 Units, 0-5 Units, Subcutaneous, QHS, Kathie Dike, MD, 2 Units at 01/28/21 2154 .  insulin aspart (novoLOG) injection 10 Units, 10 Units, Subcutaneous, TID WC, Loletha Grayer, MD, 10 Units at 01/29/21 1247 .  insulin glargine-yfgn (SEMGLEE) injection 14 Units, 14 Units, Subcutaneous, QHS, Loletha Grayer, MD, 14 Units at 01/28/21 2154 .  levalbuterol (XOPENEX) nebulizer solution 1.25 mg, 1.25 mg, Nebulization, Q4H PRN, Howerter, Justin B, DO, 1.25 mg at 01/17/21 2011 .  methylPREDNISolone sodium succinate (SOLU-MEDROL) 40 mg/mL injection 20 mg, 20 mg, Intravenous, Q12H, Ottie Glazier, MD, 20 mg at 01/29/21 0927 .  multivitamin with minerals tablet 1 tablet, 1 tablet, Oral, Daily, Kathie Dike, MD, 1 tablet at 01/29/21 0928 .  mycophenolate  (CELLCEPT) capsule 500 mg, 500 mg, Oral, BID, Ottie Glazier, MD, 500 mg at 01/29/21 0928 .  simethicone (MYLICON) chewable tablet 80 mg, 80 mg, Oral, QID PRN, Loletha Grayer, MD, 80 mg at 01/29/21 1411 .  sodium chloride flush (NS) 0.9 % injection 10-40 mL, 10-40 mL, Intracatheter, Q12H, Memon, Jehanzeb, MD, 10 mL  at 01/29/21 0935 .  sodium chloride flush (NS) 0.9 % injection 10-40 mL, 10-40 mL, Intracatheter, PRN, Kathie Dike, MD, 10 mL at 01/24/21 0915 .  sulfamethoxazole-trimethoprim (BACTRIM) 400-80 MG per tablet 1 tablet, 1 tablet, Oral, Q12H, Ottie Glazier, MD, 1 tablet at 01/29/21 6948  Facility-Administered Medications Ordered in Other Encounters:  .  heparin lock flush 100 unit/mL, 500 Units, Intravenous, Once, Earlie Server, MD    ALLERGIES   Penicillins     REVIEW OF SYSTEMS    Review of Systems:  Gen:  Denies  fever, sweats, chills weigh loss  HEENT: Denies blurred vision, double vision, ear pain, eye pain, hearing loss, nose bleeds, sore throat Cardiac:  No dizziness, chest pain or heaviness, chest tightness,edema Resp:   Denies cough or sputum porduction, shortness of breath,wheezing, hemoptysis,  Gi: Denies swallowing difficulty, stomach pain, nausea or vomiting, diarrhea, constipation, bowel incontinence Gu:  Denies bladder incontinence, burning urine Ext:   Denies Joint pain, stiffness or swelling Skin: Denies  skin rash, easy bruising or bleeding or hives Endoc:  Denies polyuria, polydipsia , polyphagia or weight change Psych:   Denies depression, insomnia or hallucinations   Other:  All other systems negative   VS: BP 118/69 (BP Location: Right Arm)   Pulse 74   Temp 98.4 F (36.9 C)   Resp 18   Ht 6' (1.829 m)   Wt 72.1 kg   SpO2 97%   BMI 21.56 kg/m      PHYSICAL EXAM    GENERAL:NAD, no fevers, chills, no weakness no fatigue HEAD: Normocephalic, atraumatic.  EYES: Pupils equal, round, reactive to light. Extraocular muscles intact. No  scleral icterus.  MOUTH: Moist mucosal membrane. Dentition intact. No abscess noted.  EAR, NOSE, THROAT: Clear without exudates. No external lesions.  NECK: Supple. No thyromegaly. No nodules. No JVD.  PULMONARY: Bilateral rhonchi improved from previous CARDIOVASCULAR: S1 and S2. Regular rate and rhythm. No murmurs, rubs, or gallops. No edema. Pedal pulses 2+ bilaterally.  GASTROINTESTINAL: Soft, nontender, nondistended. No masses. Positive bowel sounds. No hepatosplenomegaly.  MUSCULOSKELETAL: No swelling, clubbing, or edema. Range of motion full in all extremities.  NEUROLOGIC: Cranial nerves II through XII are intact. No gross focal neurological deficits. Sensation intact. Reflexes intact.  SKIN: No ulceration, lesions, rashes, or cyanosis. Skin warm and dry. Turgor intact.  PSYCHIATRIC: Mood, affect within normal limits. The patient is awake, alert and oriented x 3. Insight, judgment intact.       IMAGING    CT Angio Chest PE W/Cm &/Or Wo Cm  Result Date: 01/13/2021 CLINICAL DATA:  Respiratory distress, hypoxia EXAM: CT ANGIOGRAPHY CHEST WITH CONTRAST TECHNIQUE: Multidetector CT imaging of the chest was performed using the standard protocol during bolus administration of intravenous contrast. Multiplanar CT image reconstructions and MIPs were obtained to evaluate the vascular anatomy. CONTRAST:  12m OMNIPAQUE IOHEXOL 350 MG/ML SOLN COMPARISON:  10/26/2020, 01/13/2021 FINDINGS: Cardiovascular: This is a technically adequate evaluation of the pulmonary vasculature. No filling defects or pulmonary emboli. The heart is unremarkable without pericardial effusion. No evidence of thoracic aortic aneurysm or dissection. Right chest wall port via internal jugular approach tip within the superior vena cava. Mediastinum/Nodes: No pathologic adenopathy within the mediastinum, hila, or axilla. Thyroid, trachea, and esophagus are unremarkable. Lungs/Pleura: There has been interval progression of the  multifocal ground-glass airspace disease seen previously. Changes are most pronounced within the dependent lower lobes. No effusion or pneumothorax. The central airways are patent, with persistent bronchiectasis. Upper Abdomen: No acute abnormality.  Musculoskeletal: No acute or destructive bony lesions. Reconstructed images demonstrate no additional findings. Review of the MIP images confirms the above findings. IMPRESSION: 1. No evidence of pulmonary embolus. 2. Progressive multifocal bilateral ground-glass airspace disease, greatest at the lung bases. The differential diagnosis would include progressive postinflammatory scarring and fibrosis, multifocal atypical pneumonia, or hypersensitivity/drug toxicity. 3. Stable bronchiectasis. Electronically Signed   By: Randa Ngo M.D.   On: 01/13/2021 17:13   DG Chest Port 1 View  Result Date: 01/28/2021 CLINICAL DATA:  Shortness of breath. EXAM: PORTABLE CHEST 1 VIEW COMPARISON:  Multiple recent chest films. FINDINGS: The right IJ power port is stable. Persistent low lung volumes and bilateral pulmonary infiltrates. No definite pleural effusion or pneumothorax. IMPRESSION: Persistent low lung volumes and bilateral pulmonary infiltrates. Electronically Signed   By: Marijo Sanes M.D.   On: 01/28/2021 16:03   DG Chest Port 1 View  Result Date: 01/26/2021 CLINICAL DATA:  Shortness of breath, abnormal chest x-ray EXAM: PORTABLE CHEST 1 VIEW COMPARISON:  01/24/2021 FINDINGS: Right Port-A-Cath remains in place, unchanged. Low lung volumes with bilateral airspace opacities, most pronounced in the left perihilar and lower lobe, unchanged since prior study. No visible effusions or pneumothorax. Heart is normal size. IMPRESSION: Low lung volumes with bilateral airspace disease, left-greater-than-right. No significant change. Electronically Signed   By: Rolm Baptise M.D.   On: 01/26/2021 09:42   DG Chest Port 1 View  Result Date: 01/24/2021 CLINICAL DATA:  Follow-up  multifocal pneumonia. EXAM: PORTABLE CHEST 1 VIEW COMPARISON:  01/22/2021 and older exams. FINDINGS: Mild interval decrease in the airspace lung opacities, most evident on the left. Lung volumes remain low. No new lung abnormalities. No visualized pleural effusion.  No pneumothorax. Right internal jugular Port-A-Cath is stable. IMPRESSION: 1. Mild interval improvement in lung aeration with decreased airspace lung opacities, most notably on the left. Electronically Signed   By: Lajean Manes M.D.   On: 01/24/2021 07:57   DG Chest Port 1 View  Result Date: 01/22/2021 CLINICAL DATA:  Hypoxia, shortness of breath. EXAM: PORTABLE CHEST 1 VIEW COMPARISON:  January 20, 2021. FINDINGS: Stable cardiomediastinal silhouette. Right internal jugular Port-A-Cath is unchanged in position. Hypoinflation of the lungs is noted. Stable bilateral lung opacities are noted, left greater than right, most consistent with multifocal pneumonia. Bony thorax is unremarkable. IMPRESSION: Hypoinflation of the lungs. Stable bilateral lung opacities are noted, left greater than right, most consistent with multifocal pneumonia. Electronically Signed   By: Marijo Conception M.D.   On: 01/22/2021 19:26   DG Chest Port 1 View  Result Date: 01/20/2021 CLINICAL DATA:  Pneumonitis EXAM: PORTABLE CHEST 1 VIEW COMPARISON:  01/19/2021 FINDINGS: Shallow inspiration. Patchy infiltrates in the left lung and right lung base, similar to prior study and likely multifocal pneumonia. No pleural effusions. No pneumothorax. Mediastinal contours appear intact. Heart size is normal. Power port type central venous catheter on the right with tip over the cavoatrial junction region. Surgical clips in the right upper quadrant. IMPRESSION: Shallow inspiration. Patchy infiltrates in both lungs, likely multifocal pneumonia, without change. Electronically Signed   By: Lucienne Capers M.D.   On: 01/20/2021 03:56   DG Chest Port 1 View  Result Date:  01/19/2021 CLINICAL DATA:  Abnormality on previous chest x-ray in a 60 year old male. EXAM: PORTABLE CHEST 1 VIEW COMPARISON:  CT from January 13, 2021, chest x-ray January 18, 2021. FINDINGS: RIGHT-sided Port-A-Cath terminates at the caval to atrial junction. Trachea is midline. Cardiomediastinal contours are stable. Interstitial and  airspace opacities throughout the chest worse in the LEFT mid to upper chest, RIGHT upper chest lung bases bilaterally with similar appearance. On limited assessment there is no acute skeletal process. EKG leads project over the chest. IMPRESSION: No interval change in the appearance of the chest since previous imaging with continued decreased lung volumes and patchy bilateral opacities. Electronically Signed   By: Zetta Bills M.D.   On: 01/19/2021 07:59   DG Chest Port 1 View  Result Date: 01/18/2021 CLINICAL DATA:  Respiratory distress EXAM: PORTABLE CHEST 1 VIEW COMPARISON:  01/17/2021 FINDINGS: Cardiac shadow is stable. Right-sided chest wall port is again seen. Lungs are hypoinflated. Patchy airspace opacities again identified in both lungs left greater than right but stable from the prior exam. IMPRESSION: Patchy airspace opacities bilaterally stable from the prior exam. Electronically Signed   By: Inez Catalina M.D.   On: 01/18/2021 17:24   DG Chest Port 1 View  Result Date: 01/17/2021 CLINICAL DATA:  Worsening shortness of breath. EXAM: PORTABLE CHEST 1 VIEW COMPARISON:  01/14/2021 FINDINGS: 0826 hours. Low volume film. Patchy airspace disease again noted in both lungs, left greater than right. Cardiopericardial silhouette is at upper limits of normal for size. Right Port-A-Cath again noted. Telemetry leads overlie the chest. IMPRESSION: Low volume film with patchy bilateral airspace disease, left greater than right. No substantial change. Electronically Signed   By: Misty Stanley M.D.   On: 01/17/2021 08:53   DG Chest Port 1 View  Result Date:  01/14/2021 CLINICAL DATA:  Shortness of breath.  History of cancer.  Diabetes. EXAM: PORTABLE CHEST 1 VIEW.  Patient is rotated. COMPARISON:  Chest x-ray 01/13/2021, CT chest 01/13/2021 FINDINGS: Accessed right chest wall Port-A-Cath in stable position. The heart and mediastinal contours are within normal limits. Low lung volumes with persistent diffuse patchy airspace opacities. No pleural effusion. No pneumothorax. No acute osseous abnormality. Right upper quadrant surgical clips. IMPRESSION: Low lung volumes with persistent diffuse patchy airspace opacities. Electronically Signed   By: Iven Finn M.D.   On: 01/14/2021 21:08   DG Chest Port 1 View  Result Date: 01/13/2021 CLINICAL DATA:  Shortness of breath EXAM: PORTABLE CHEST 1 VIEW COMPARISON:  Chest radiograph 07/29/2019 FINDINGS: There is a right chest wall port with the tip terminating in the lower SVC/cavoatrial junction. The cardiomediastinal silhouette is within normal limits. Lung volumes are low with unchanged asymmetric elevation of the right hemidiaphragm. There are increased interstitial markings with diffuse reticular opacities throughout both lungs. There is no focal consolidation. There is no significant pleural effusion. There is no pneumothorax. There is no acute osseous abnormality. There is gaseous distention of the stomach and large bowel in the right upper quadrant. IMPRESSION: Low lung volumes with suspected mild pulmonary interstitial edema. Atypical/viral infection could have a similar appearance. Electronically Signed   By: Valetta Mole M.D.   On: 01/13/2021 14:24      ASSESSMENT/PLAN   Acute hypoxemic respiratory failure Due to acute exacerbation of interstitial lung disease  - present on admission  - COVID19 negative   - supplemental O2 during my evaluation 10l/min>>8L>>HFNC  -Respiratory viral panel-negative  -serum fungitell-negative  -legionella ab-negative  -nasal MRSA PCR-negative -Procalcitonin trend  reviewed -strep pneumoniae ur AG -Histoplasma Ur Ag-negative -sputum resp cultures -AFB sputum expectorated specimen -sputum cytology  -completed  Cefepime course for cap -Reduced Solumedrol to 40 BID - 01/15/21>>50 BID>>40 BID>>30 BID>>20 BID>>pred 50 -reviewed pertinent imaging with patient today - ESR and CRP are elevated but improving -  PT/OT for d/c planning  -please encourage patient to use incentive spirometer few times each hour while hospitalized.   -continue Cellcept 250 bid >>500 bid  -dcd lasix 01/28/21 -UOP is adequate -Discussed goal of care - recommend DNR patient is in agreement but still discussing with wife.    Acute blood loss anemia-resolved    - noted Hb trending down from 14 to 10 -    -fecal occult blood test    - may be due to GI bleed due to eliquis - monitor for grossly visible bleeding and stable vital sings    Thank you for allowing me to participate in the care of this patient.   Patient/Family are satisfied with care plan and all questions have been answered.   This document was prepared using Dragon voice recognition software and may include unintentional dictation errors.     Ottie Glazier, M.D.  Division of [place]

## 2021-01-29 NOTE — Progress Notes (Signed)
Patient ID: Edward Atkinson, male   DOB: 1960-10-11, 60 y.o.   MRN: [id number] [facility] PROGRESS NOTE  Edward Atkinson [id number]:[id number] DOB: 11/09/1960 DOA: 01/13/2021 PCP: Edward Aus, MD  HPI/Subjective: Patient feels okay.  I dialed his oxygen down to 9 L high flow nasal cannula and he dropped his sats into the mid 80s.  He was on 12 L when I saw him this morning.  Dialed him back to 10.5 L and held his sats in the low 90s.  He felt okay when his sats dropped.  Admitted 16 days ago with acute hypoxic respiratory failure and interstitial lung disease exacerbation.  Objective: Vitals:   01/29/21 0755 01/29/21 1144  BP:  122/79  Pulse:  74  Resp:  18  Temp:  98.3 F (36.8 C)  SpO2: 92% 98%    Intake/Output Summary (Last 24 hours) at 01/29/2021 1542 Last data filed at 01/29/2021 1340 Gross per 24 hour  Intake 720 ml  Output 2425 ml  Net -1705 ml   Filed Weights   01/27/21 0457 01/28/21 0421 01/29/21 0351  Weight: 73 kg 74.4 kg 72.1 kg    ROS: Review of Systems  Respiratory:  Positive for shortness of breath. Negative for cough.   Cardiovascular:  Negative for chest pain.  Gastrointestinal:  Negative for abdominal pain, nausea and vomiting.  Exam: Physical Exam HENT:     Head: Normocephalic.     Mouth/Throat:     Pharynx: No oropharyngeal exudate.  Eyes:     General: Lids are normal.     Conjunctiva/sclera: Conjunctivae normal.  Cardiovascular:     Rate and Rhythm: Normal rate and regular rhythm.     Heart sounds: Normal heart sounds, S1 normal and S2 normal.  Pulmonary:     Breath sounds: Examination of the right-middle field reveals wheezing. Examination of the left-middle field reveals wheezing. Examination of the right-lower field reveals decreased breath sounds and rhonchi. Examination of the left-lower field reveals decreased breath sounds and rhonchi. Decreased breath sounds, wheezing and rhonchi present. No rales.  Abdominal:     Palpations: Abdomen is  soft.     Tenderness: There is no abdominal tenderness.  Musculoskeletal:     Right lower leg: Swelling present.     Left lower leg: Swelling present.  Skin:    General: Skin is warm.     Findings: No rash.  Neurological:     Mental Status: He is alert and oriented to person, place, and time.      Scheduled Meds:  acyclovir  400 mg Oral BID   apixaban  5 mg Oral BID   Chlorhexidine Gluconate Cloth  6 each Topical Daily   digoxin  0.125 mg Oral Daily   diltiazem  180 mg Oral Daily   diphenhydrAMINE  25 mg Oral QHS   docusate sodium  200 mg Oral BID   feeding supplement (NEPRO CARB STEADY)  237 mL Oral TID BM   guaiFENesin  1,200 mg Oral BID   insulin aspart  0-20 Units Subcutaneous TID WC   insulin aspart  0-5 Units Subcutaneous QHS   insulin aspart  10 Units Subcutaneous TID WC   insulin glargine-yfgn  14 Units Subcutaneous QHS   methylPREDNISolone (SOLU-MEDROL) injection  20 mg Intravenous Q12H   multivitamin with minerals  1 tablet Oral Daily   mycophenolate  500 mg Oral BID   sodium chloride flush  10-40 mL Intracatheter Q12H   sulfamethoxazole-trimethoprim  1 tablet Oral Q12H  Assessment/Plan:  Acute on chronic hypoxic respiratory failure.  This morning was on 12 L of high flow nasal cannula.  I dialed down to 9 L and he desaturated into the upper 80s.  Changed him to 10.5 L and he held his saturations in the low 90s.  Encouraged incentive spirometry use and slow deep breaths.  Asked nursing staff to get him up every time they go into the room. Interstitial lung disease exacerbation.  On Solu-Medrol and CellCept Type 2 diabetes mellitus with hypoglycemic episodes.  Sugars higher with me cutting back on medications. Sepsis, present on admission has resolved.  Completed antibiotics. Paroxysmal atrial fibrillation on Eliquis for anticoagulation and Cardizem and digoxin for rate control. Elevated troponin secondary to demand ischemia Thrombocytosis has resolved History  of follicular lymphoma Prior history of pulmonary embolism on Eliquis.     Code Status:     Code Status Orders  (From admission, onward)           Start     Ordered   01/13/21 1831  Full code  Continuous        01/13/21 1831           Code Status History     Date Active Date Inactive Code Status Order ID Comments User Context   07/29/2019 1447 08/01/2019 1954 Full Code [id number]  Ivor Costa, MD ED   01/29/2019 1601 02/01/2019 1733 Full Code [id number]  Nicholes Mango, MD ED      Advance Directive Documentation    Flowsheet Row Most Recent Value  Type of Advance Directive Living will, Healthcare Power of Attorney  Pre-existing out of facility DNR order (yellow form or pink MOST form) --  "MOST" Form in Place? --      Family Communication: Declined Disposition Plan: Status is: Inpatient  Dispo: The patient is from: Home              Anticipated d/c is to: Home              Patient currently unable to be discharged on high flow nasal cannula.   Difficult to place patient.  No.  Consultants: Pulmonary  Time spent: 26 minutes  [place]

## 2021-01-30 ENCOUNTER — Inpatient Hospital Stay: Payer: BC Managed Care – PPO

## 2021-01-30 DIAGNOSIS — J9621 Acute and chronic respiratory failure with hypoxia: Secondary | ICD-10-CM | POA: Diagnosis not present

## 2021-01-30 DIAGNOSIS — E11649 Type 2 diabetes mellitus with hypoglycemia without coma: Secondary | ICD-10-CM | POA: Diagnosis not present

## 2021-01-30 DIAGNOSIS — D72829 Elevated white blood cell count, unspecified: Secondary | ICD-10-CM

## 2021-01-30 DIAGNOSIS — J849 Interstitial pulmonary disease, unspecified: Secondary | ICD-10-CM | POA: Diagnosis not present

## 2021-01-30 LAB — CBC WITH DIFFERENTIAL/PLATELET
Abs Immature Granulocytes: 2.23 10*3/uL — ABNORMAL HIGH (ref 0.00–0.07)
Basophils Absolute: 0.2 10*3/uL — ABNORMAL HIGH (ref 0.0–0.1)
Basophils Relative: 0 %
Eosinophils Absolute: 0.2 10*3/uL (ref 0.0–0.5)
Eosinophils Relative: 1 %
HCT: 36.2 % — ABNORMAL LOW (ref 39.0–52.0)
Hemoglobin: 12.2 g/dL — ABNORMAL LOW (ref 13.0–17.0)
Immature Granulocytes: 6 %
Lymphocytes Relative: 2 %
Lymphs Abs: 0.8 10*3/uL (ref 0.7–4.0)
MCH: 28.4 pg (ref 26.0–34.0)
MCHC: 33.7 g/dL (ref 30.0–36.0)
MCV: 84.2 fL (ref 80.0–100.0)
Monocytes Absolute: 1.7 10*3/uL — ABNORMAL HIGH (ref 0.1–1.0)
Monocytes Relative: 4 %
Neutro Abs: 34.4 10*3/uL — ABNORMAL HIGH (ref 1.7–7.7)
Neutrophils Relative %: 87 %
Platelets: 319 10*3/uL (ref 150–400)
RBC: 4.3 MIL/uL (ref 4.22–5.81)
RDW: 18.5 % — ABNORMAL HIGH (ref 11.5–15.5)
WBC: 39.5 10*3/uL — ABNORMAL HIGH (ref 4.0–10.5)
nRBC: 0 % (ref 0.0–0.2)

## 2021-01-30 LAB — COMPREHENSIVE METABOLIC PANEL
ALT: 38 U/L (ref 0–44)
AST: 38 U/L (ref 15–41)
Albumin: 2.7 g/dL — ABNORMAL LOW (ref 3.5–5.0)
Alkaline Phosphatase: 70 U/L (ref 38–126)
Anion gap: 13 (ref 5–15)
BUN: 22 mg/dL (ref 8–23)
CO2: 25 mmol/L (ref 22–32)
Calcium: 8 mg/dL — ABNORMAL LOW (ref 8.9–10.3)
Chloride: 93 mmol/L — ABNORMAL LOW (ref 98–111)
Creatinine, Ser: 0.71 mg/dL (ref 0.61–1.24)
GFR, Estimated: >60 mL/min (ref >60)
Glucose, Bld: 163 mg/dL — ABNORMAL HIGH (ref 70–99)
Potassium: 3.9 mmol/L (ref 3.5–5.1)
Sodium: 131 mmol/L — ABNORMAL LOW (ref 135–145)
Total Bilirubin: 0.9 mg/dL (ref 0.3–1.2)
Total Protein: 4.8 g/dL — ABNORMAL LOW (ref 6.5–8.1)

## 2021-01-30 LAB — GLUCOSE, CAPILLARY
Glucose-Capillary: 140 mg/dL — ABNORMAL HIGH (ref 70–99)
Glucose-Capillary: 195 mg/dL — ABNORMAL HIGH (ref 70–99)
Glucose-Capillary: 120 mg/dL — ABNORMAL HIGH (ref 70–99)
Glucose-Capillary: 466 mg/dL — ABNORMAL HIGH (ref 70–99)
Glucose-Capillary: 155 mg/dL — ABNORMAL HIGH (ref 70–99)

## 2021-01-30 LAB — PHOSPHORUS: Phosphorus: 2.9 mg/dL (ref 2.5–4.6)

## 2021-01-30 LAB — SEDIMENTATION RATE: Sed Rate: 6 mm/hr (ref 0–20)

## 2021-01-30 LAB — C-REACTIVE PROTEIN: CRP: 0.9 mg/dL (ref <1.0)

## 2021-01-30 LAB — PROCALCITONIN: Procalcitonin: <0.1 ng/mL

## 2021-01-30 MED ORDER — AZITHROMYCIN 250 MG PO TABS
500.0000 mg | ORAL_TABLET | Freq: Every day | ORAL | Status: AC
  Administered 2021-01-30: 500 mg via ORAL
  Filled 2021-01-30: qty 2

## 2021-01-30 MED ORDER — SALINE SPRAY 0.65 % NA SOLN
1.0000 | NASAL | Status: DC | PRN
  Administered 2021-01-30: 1 via NASAL
  Filled 2021-01-30 (×2): qty 44

## 2021-01-30 MED ORDER — PIPERACILLIN-TAZOBACTAM 3.375 G IVPB
3.3750 g | Freq: Three times a day (TID) | INTRAVENOUS | Status: DC
  Administered 2021-01-30 – 2021-01-31 (×3): 3.375 g via INTRAVENOUS
  Filled 2021-01-30 (×3): qty 50

## 2021-01-30 MED ORDER — TRAZODONE HCL 50 MG PO TABS
50.0000 mg | ORAL_TABLET | Freq: Every evening | ORAL | Status: DC | PRN
  Administered 2021-01-30 – 2021-02-18 (×11): 50 mg via ORAL
  Filled 2021-01-30 (×11): qty 1

## 2021-01-30 MED ORDER — AZITHROMYCIN 250 MG PO TABS
250.0000 mg | ORAL_TABLET | Freq: Every day | ORAL | Status: DC
  Administered 2021-01-31: 250 mg via ORAL
  Filled 2021-01-30: qty 1

## 2021-01-30 MED ORDER — ALPRAZOLAM 0.25 MG PO TABS
0.2500 mg | ORAL_TABLET | Freq: Two times a day (BID) | ORAL | Status: DC | PRN
  Administered 2021-01-30 – 2021-02-03 (×3): 0.25 mg via ORAL
  Filled 2021-01-30 (×3): qty 1

## 2021-01-30 MED ORDER — POLYETHYLENE GLYCOL 3350 17 G PO PACK
17.0000 g | PACK | Freq: Every day | ORAL | Status: DC
  Administered 2021-01-30 – 2021-02-17 (×14): 17 g via ORAL
  Filled 2021-01-30 (×17): qty 1

## 2021-01-30 MED ORDER — FUROSEMIDE 10 MG/ML IJ SOLN
40.0000 mg | Freq: Every day | INTRAMUSCULAR | Status: DC
  Administered 2021-01-30 – 2021-02-02 (×4): 40 mg via INTRAVENOUS
  Filled 2021-01-30 (×4): qty 4

## 2021-01-30 NOTE — Consult Note (Signed)
Pharmacy Antibiotic Note  Edward Atkinson is a 60 y.o. male w/ h/o ILD, H6KG, parAFIB, follicular lymphoma, PE, presenting with Sepsis and AoC Resp failure. Admitted on 01/13/2021 now increasing abx treatment of pneumonia (HCAP) following heightened O2 req.  Pharmacy has been consulted for Zosyn dosing.  Plan: Zosyn 3.375g IV q8h (4 hour infusion). Pt also receiving: - azithromycin PO 535m x1; then 2529mPO QD x4d. - acyclovir 40085mO BID   Height: 6' (182.9 cm) Weight: 71.8 kg (158 lb 3.2 oz) IBW/kg (Calculated) : 77.6  Temp (24hrs), Avg:98.3 F (36.8 C), Min:97.8 F (36.6 C), Max:98.7 F (37.1 C)  Recent Labs  Lab 01/24/21 0500 01/25/21 0615 01/26/21 0852 01/27/21 0530 01/30/21 1125  WBC 19.0* 21.8*  --  19.3* 39.5*  CREATININE 0.60* 0.81 0.66 0.63  --     Estimated Creatinine Clearance: 97.2 mL/min (by C-G formula based on SCr of 0.63 mg/dL).    Allergies  Allergen Reactions   Penicillins Rash    Did it involve swelling of the face/tongue/throat, SOB, or low BP? No Did it involve sudden or severe rash/hives, skin peeling, or any reaction on the inside of your mouth or nose? No Did you need to seek medical attention at a hospital or doctor's office? No When did it last happen?      Unknown If all above answers are "NO", may proceed with cephalosporin use.    Antimicrobials this admission: Zosyn (10/01 >>  Azith (10/01 >> Acyclovir  (9/16 >>   Dose adjustments this admission: N/A will CTM and adjust PRN.  Microbiology results: 9/20 Aspergillus Ag (BAL/serum): 0.02  9/16 RVP: Negative  9/16 MRSA PCR: negative 9/14 COV/FLU - negative 9/14 BCx: NGTD (Final)  Thank you for allowing pharmacy to be a part of this patient's care.  BraShanon Browers 01/30/2021 3:40 PM

## 2021-01-30 NOTE — Progress Notes (Signed)
Patient ID: Edward Atkinson, male   DOB: 1960-08-11, 60 y.o.   MRN: [id number] [facility] PROGRESS NOTE  Edward Atkinson [id number]:[id number] DOB: Mar 10, 1961 DOA: 01/13/2021 PCP: Rusty Aus, MD  HPI/Subjective: Patient seen this morning was on 8 L high flow nasal cannula.  I was called back to the bedside because he went to the commode and became short of breath.  He said he was straining to have a bowel movement.  He needed to be put on heated high flow nasal cannula almost 100% oxygen.  He felt short of breath when he was on the commode.  Objective: Vitals:   01/30/21 0715 01/30/21 1156  BP: 114/77 116/70  Pulse: 74 (!) 109  Resp: 18 17  Temp: 98.7 F (37.1 C) 98.7 F (37.1 C)  SpO2: 100% 99%    Intake/Output Summary (Last 24 hours) at 01/30/2021 1509 Last data filed at 01/30/2021 1408 Gross per 24 hour  Intake 240 ml  Output 825 ml  Net -585 ml   Filed Weights   01/28/21 0421 01/29/21 0351 01/30/21 0444  Weight: 74.4 kg 72.1 kg 71.8 kg    ROS: Review of Systems  Respiratory:  Positive for cough and shortness of breath.   Cardiovascular:  Negative for chest pain.  Gastrointestinal:  Negative for abdominal pain, nausea and vomiting.  Exam: Physical Exam HENT:     Head: Normocephalic.     Mouth/Throat:     Pharynx: No oropharyngeal exudate.  Eyes:     General: Lids are normal.     Conjunctiva/sclera: Conjunctivae normal.  Cardiovascular:     Rate and Rhythm: Normal rate and regular rhythm.     Heart sounds: Normal heart sounds, S1 normal and S2 normal.  Pulmonary:     Breath sounds: Examination of the right-lower field reveals decreased breath sounds and rales. Examination of the left-lower field reveals decreased breath sounds and rales. Decreased breath sounds and rales present. No wheezing or rhonchi.  Abdominal:     Palpations: Abdomen is soft.     Tenderness: There is no abdominal tenderness.  Musculoskeletal:     Right ankle: Swelling present.     Left  ankle: Swelling present.  Skin:    General: Skin is warm.     Findings: No rash.  Neurological:     Mental Status: He is alert and oriented to person, place, and time.      Scheduled Meds:  acyclovir  400 mg Oral BID   apixaban  5 mg Oral BID   Chlorhexidine Gluconate Cloth  6 each Topical Daily   digoxin  0.125 mg Oral Daily   diltiazem  180 mg Oral Daily   diphenhydrAMINE  25 mg Oral QHS   docusate sodium  200 mg Oral BID   feeding supplement (NEPRO CARB STEADY)  237 mL Oral TID BM   furosemide  40 mg Intravenous Daily   guaiFENesin  1,200 mg Oral BID   insulin aspart  0-20 Units Subcutaneous TID WC   insulin aspart  0-5 Units Subcutaneous QHS   insulin aspart  10 Units Subcutaneous TID WC   insulin glargine-yfgn  14 Units Subcutaneous QHS   multivitamin with minerals  1 tablet Oral Daily   mycophenolate  500 mg Oral BID   predniSONE  50 mg Oral Q breakfast   sodium chloride flush  10-40 mL Intracatheter Q12H   sulfamethoxazole-trimethoprim  1 tablet Oral Q12H   Assessment/Plan:  Acute on chronic hypoxic respiratory failure.  This  morning was down to 8 L high flow nasal cannula.  He got up to go to the commode and desaturated and had shortness of breath and needed to be changed to heated high flow nasal cannula 97% oxygen.  Hopefully can taper back to bubble high flow nasal cannula soon. Interstitial lung disease exacerbation.  Patient switched to prednisone and on CellCept. Leukocytosis.  White blood cell count elevated at 39.5.  Procalcitonin negative.  Patient has been empirically on Bactrim.  Discussing with pulmonary on whether to start antibiotics or hold off. Type 2 diabetes mellitus with episodes of hypoglycemia.  Continue glargine insulin 14 units daily and sliding scale insulin. Paroxysmal atrial fibrillation.  Continue Eliquis for anticoagulation and Cardizem and digoxin for rate control. History of follicular lymphoma Prior history of pulmonary embolism on  Eliquis Sepsis on presentation.        Code Status:     Code Status Orders  (From admission, onward)           Start     Ordered   01/13/21 1831  Full code  Continuous        01/13/21 1831           Code Status History     Date Active Date Inactive Code Status Order ID Comments User Context   07/29/2019 1447 08/01/2019 1954 Full Code [id number]  Ivor Costa, MD ED   01/29/2019 1601 02/01/2019 1733 Full Code [id number]  Nicholes Mango, MD ED      Advance Directive Documentation    Flowsheet Row Most Recent Value  Type of Advance Directive Living will, Healthcare Power of Attorney  Pre-existing out of facility DNR order (yellow form or pink MOST form) --  "MOST" Form in Place? --      Family Communication: Declined Disposition Plan: Status is: Inpatient  Dispo: The patient is from: Home              Anticipated d/c is to: Home              Patient currently with worsening respiratory status needed to be placed on heated high flow nasal cannula today after trying to get to the commode.   Difficult to place patient.  No.  Consultants: Pulmonary  Antibiotics: Bactrim and acyclovir  Time spent: 29 minutes seen this morning and reassessed late morning.  [place]  [facility] [employer]

## 2021-01-30 NOTE — Progress Notes (Signed)
Pulmonary Medicine          Date: 01/30/2021,   MRN# [id number] KORAY SOTER 01-07-1961     AdmissionWeight: 63.5 kg                 CurrentWeight: 71.8 kg   Referring physician: Dr Roderic Palau   CHIEF COMPLAINT:   Acute on chronic hypoxemic respiratory failure   HISTORY OF PRESENT ILLNESS   60 yo M w/hx of chronic hypoxemia and ILD post COVID19, T2DM, PE on eliquis, Lymphoma s/p Rituxan which is completed.  Came in due to worsening SOB and DOE specifically mild exertional defacation/urination with feelings of presyncope and severe dyspnea which started to get worse post tapering from steroid appx few days prior to onset of symptom progression.  He was placed on solumedrol and reported mild improvement.  He is on 10L /min [place] during my evaluation.  He had CT chest done with PE protocol and noted to have bilateral GGO infiltrates worse compared to June study. PCCM consultation for further evaluation and management.     01/17/21-  patient seems to be slighly clinically worse post reduction of IV steroids yesterday indicative of ongoing ILD exacerbation, thus far infectious workup is negative but inflammatory biomarkers are highly elevated also pointing towards ILD exacerbation.  There is new CXR in process this morning for interval changes. We discussed increasing steroids slightly and giving more time.  I will also initiate steroid sparing immunomodulation with BID cellcept as patient will likely need therapy for prolonged time period.  Compensatory tachycardia noted appreciate cardiology team.  Sugars with peaks and valleys with adjusted steroid therapy.  Patient with muscle wasting and protein calorie malnutrition but eating better this am.  01/18/21- patient is on HFNC at 60%, feels better, has not been able to tolerate BIPAP.  Wife is at bedside.  BP is borderline low MAP 76.  He has negative infectious workup and thus far ILD exacerbation seems to be main differential. He  continues to have peaks and valleys in blood glucose.  We started steroid sparing regimen with cellcept bid 250 mg and will increase dose as we taper off prednisone.  Remains tachyarrtythmic but improved. Appreciate everyone involved.     01/19/21- patient seen and examined at bedside.  Wife present during evaluation.  Ow HFNC weaned to 41%/45L/MIN.  CRP is still up, have increased cellcept and reducing solumedrol.  Patient felt stronger physically and was able to get OOB to chair. Cough is less now.  He is non-labored with breathing no accessory mm use today. He had dark stools with reduced hb, FOBT ordred.  Haptoglobin is elevated. Pathology slide review ordered.   01/20/21- patient is essentially unchanged from yesterday from pulmonary perspective. CXR also unchanged,  bloodwork with reduction in CRP.  Plan to wean O2 and continue OOB with PT/OT,  Continue current steroid dose and Cellcept.   01/21/21- patient is stable on HFNC.  He had small set back with increased O2 req after exerting himself in bathroom.  HFNC from 41/45 to 54/45.   He diuresed very well with lasix 20 bid >2L urine in past 24h.  Tachycardia is improved. He does not feel weaker and infact states he feels slowly improved.   01/22/21- I spoke to patient regarding goals of care and recommend DNR.  He is agreeable but I asked him to discuss with wife and family out of respect and we will revisit tommorow.  He states he is feeling better  and CRP is trending down but he remains on HFNC 60/40.  He was able to get OOB today and walked to bathroom but experienced mild desaturation with quick recovery "not much issues".      01/23/21- patient is still on high flow nasal canula , he is on 56%/45L.  We reviewed code status and he is still in discussion with family regarding DNR.   We discussed lung transplant per family request and this may be a future option if he is strong enough and recovers.  I have increased insluin TID to 20 from 15 and  increased bactrim to bid SS   01/24/21- patient weaned on HFNC to 49/45, he was able to use BIPAP overnight thinks its helping.  CXR this AM with interval improvement. Will keep on current dose of meds.  Repeat CRP in am.  Last BM yesterday well formed no blood, eating good, urine output very good on 20 bid lasix. Getting OOB with  transient desaturation.  Overall slight improvement.    01/25/21- patient has weaned down to 35%/40L/min.  He remains full code. Reviewed medical plan with Dr Jimmye Norman today.  Reduced steroids from 50 BID to 40 BID and also reduced insulin slightly.    01/26/21- patient is further weaned to 15L humidified HF nasal canula.  He did PT ROM exercises with mild desaturation. CXR today with expiratory image unable to see much interval changes. Patient reports improvement clinically. Plan to continue current regimen. Sugars slightly high due to steroids, will wean steroids soon. Continue cellcept as current 500 bid  01/27/21- patient is relatively same as yesterday, his O2 is weaned down to 14L.  He remains on IV solumedrol at 50 bid, we did discuss weaning this further to 40 BID. Will keep cellcept at current dose 500 bid.  His ankles have been down in seated position and are edematous on examination. He continues to participate with PT and feels good subjectively. We discussed starting dose of lasix for pedal edema.    01/28/21- patient examined at bedside, s/p PT/OT session with slight dyspnea.  He was weaned to 10L O2 but then went back up to 13 post PT due to increased WOB.  He has complete resolution of LE edema and appears to be euvolemic at this point. We discussed reducing soluemdrol today to 20 bid.  I reviewed plan with Dr Leslye Peer today.  01/29/21- patient examined at bedside.  He had a good day and feels better. We reviewed medical plan and are reducing steroids to PO prednisone starting at 88m daily.   01/30/21-  Patient seen and examined at bedside.  CXR this am with  worsening pulmonary edema, included below.  Vitals stable with mild tachypnea and tachycardia with activity. Will deliver 1 dose IV lasix 20 today. Plan to continue Pred PO and cellcept at 500 bid. Pulmonary intensive PT with IS and will initiate Metaneb therapy with albuterol. Blood work today will be repeated CRP CMP phos procal ESR.  Patient is agreeable to BIPAP QHS and Metaneb BID.   PAST MEDICAL HISTORY   Past Medical History:  Diagnosis Date   Chronic respiratory failure with hypoxia (HCC)    2L [place] continuous   COVID-19    Diabetes mellitus without complication ([facility]    Diverticulitis    Follicular lymphoma of intra-abdominal lymph nodes ([facility] 02/15/2019   ILD (interstitial lung disease) ([facility]      SURGICAL HISTORY   Past Surgical History:  Procedure Laterality Date   CHOLECYSTECTOMY  COLON RESECTION     DUE TO DIVERTICULITIS   FLEXIBLE BRONCHOSCOPY N/A 12/02/2019   Procedure: FLEXIBLE BRONCHOSCOPY;  Surgeon: Ottie Glazier, MD;  Location: [facility];  Service: Thoracic;  Laterality: N/A;   PORTA CATH INSERTION N/A 03/11/2019   Procedure: PORTA CATH INSERTION;  Surgeon: Algernon Huxley, MD;  Location: [facility];  Service: Cardiovascular;  Laterality: N/A;   PULMONARY THROMBECTOMY N/A 01/29/2019   Procedure: PULMONARY THROMBECTOMY;  Surgeon: Algernon Huxley, MD;  Location: [facility];  Service: Cardiovascular;  Laterality: N/A;     FAMILY HISTORY   Family History  Problem Relation Age of Onset   Diabetes Brother      SOCIAL HISTORY   Social History   Tobacco Use   Smoking status: Never   Smokeless tobacco: Never  Vaping Use   Vaping Use: Never used  Substance Use Topics   Alcohol use: Yes    Alcohol/week: 0.0 - 2.0 standard drinks    Comment: occasional   Drug use: Never     MEDICATIONS    Home Medication:    Current Medication:  Current Facility-Administered Medications:    acetaminophen (TYLENOL) tablet 650 mg, 650 mg, Oral, Q6H  PRN, 650 mg at 01/18/21 1411 **OR** acetaminophen (TYLENOL) suppository 650 mg, 650 mg, Rectal, Q6H PRN, Howerter, Justin B, DO   acyclovir (ZOVIRAX) 200 MG capsule 400 mg, 400 mg, Oral, BID, Memon, Jehanzeb, MD, 400 mg at 01/30/21 0855   apixaban (ELIQUIS) tablet 5 mg, 5 mg, Oral, BID, Beers, Shanon Brow, RPH, 5 mg at 01/30/21 0855   Chlorhexidine Gluconate Cloth 2 % PADS 6 each, 6 each, Topical, Daily, Memon, Jolaine Artist, MD, 6 each at 01/29/21 1247   digoxin (LANOXIN) tablet 0.125 mg, 0.125 mg, Oral, Daily, Memon, Jehanzeb, MD, 0.125 mg at 01/30/21 0855   diltiazem (CARDIZEM CD) 24 hr capsule 180 mg, 180 mg, Oral, Daily, Paraschos, Alexander, MD, 180 mg at 01/30/21 0855   diltiazem (CARDIZEM) injection 10 mg, 10 mg, Intravenous, Q6H PRN, Wyvonnia Dusky, MD, 10 mg at 01/24/21 2328   diphenhydrAMINE (BENADRYL) capsule 25 mg, 25 mg, Oral, QHS, Williams, Jamiese M, MD, 25 mg at 01/29/21 2152   docusate sodium (COLACE) capsule 200 mg, 200 mg, Oral, BID, Jimmye Norman, Jamiese M, MD, 200 mg at 01/30/21 0855   feeding supplement (NEPRO CARB STEADY) liquid 237 mL, 237 mL, Oral, TID BM, Memon, Jolaine Artist, MD, Last Rate: 0 mL/hr at 01/21/21 2130, 237 mL at 01/30/21 0857   guaiFENesin (MUCINEX) 12 hr tablet 1,200 mg, 1,200 mg, Oral, BID, Memon, Jehanzeb, MD, 1,200 mg at 01/30/21 0855   guaiFENesin-dextromethorphan (ROBITUSSIN DM) 100-10 MG/5ML syrup 5 mL, 5 mL, Oral, Q4H PRN, Kathie Dike, MD, 5 mL at 01/17/21 0648   insulin aspart (novoLOG) injection 0-20 Units, 0-20 Units, Subcutaneous, TID WC, Memon, Jolaine Artist, MD, 3 Units at 01/30/21 0857   insulin aspart (novoLOG) injection 0-5 Units, 0-5 Units, Subcutaneous, QHS, Memon, Jolaine Artist, MD, 2 Units at 01/28/21 2154   insulin aspart (novoLOG) injection 10 Units, 10 Units, Subcutaneous, TID WC, Loletha Grayer, MD, 10 Units at 01/30/21 0855   insulin glargine-yfgn (SEMGLEE) injection 14 Units, 14 Units, Subcutaneous, QHS, Wieting, Richard, MD, 14 Units at 01/29/21  2154   levalbuterol (XOPENEX) nebulizer solution 1.25 mg, 1.25 mg, Nebulization, Q4H PRN, Howerter, Justin B, DO, 1.25 mg at 01/17/21 2011   multivitamin with minerals tablet 1 tablet, 1 tablet, Oral, Daily, Kathie Dike, MD, 1 tablet at 01/30/21 0855   mycophenolate (CELLCEPT) capsule  500 mg, 500 mg, Oral, BID, Ottie Glazier, MD, 500 mg at 01/30/21 0855   predniSONE (DELTASONE) tablet 50 mg, 50 mg, Oral, Q breakfast, Ottie Glazier, MD, 50 mg at 01/30/21 0855   simethicone (MYLICON) chewable tablet 80 mg, 80 mg, Oral, QID PRN, Loletha Grayer, MD, 80 mg at 01/29/21 1411   sodium chloride flush (NS) 0.9 % injection 10-40 mL, 10-40 mL, Intracatheter, Q12H, Memon, Jolaine Artist, MD, 10 mL at 01/30/21 0858   sodium chloride flush (NS) 0.9 % injection 10-40 mL, 10-40 mL, Intracatheter, PRN, Kathie Dike, MD, 10 mL at 01/24/21 0915   sulfamethoxazole-trimethoprim (BACTRIM) 400-80 MG per tablet 1 tablet, 1 tablet, Oral, Q12H, Ottie Glazier, MD, 1 tablet at 01/30/21 7262  Facility-Administered Medications Ordered in Other Encounters:    heparin lock flush 100 unit/mL, 500 Units, Intravenous, Once, Earlie Server, MD    ALLERGIES   Penicillins     REVIEW OF SYSTEMS    Review of Systems:  Gen:  Denies  fever, sweats, chills weigh loss  HEENT: Denies blurred vision, double vision, ear pain, eye pain, hearing loss, nose bleeds, sore throat Cardiac:  No dizziness, chest pain or heaviness, chest tightness,edema Resp:   Denies cough or sputum porduction, shortness of breath,wheezing, hemoptysis,  Gi: Denies swallowing difficulty, stomach pain, nausea or vomiting, diarrhea, constipation, bowel incontinence Gu:  Denies bladder incontinence, burning urine Ext:   Denies Joint pain, stiffness or swelling Skin: Denies  skin rash, easy bruising or bleeding or hives Endoc:  Denies polyuria, polydipsia , polyphagia or weight change Psych:   Denies depression, insomnia or hallucinations   Other:  All  other systems negative   VS: BP 114/70 (BP Location: Right Arm)   Pulse 74   Temp 98 F (36.7 C) (Oral)   Resp 18   Ht 6' (1.829 m)   Wt 71.8 kg   SpO2 100%   BMI 21.46 kg/m      PHYSICAL EXAM    GENERAL:NAD, no fevers, chills, no weakness no fatigue HEAD: Normocephalic, atraumatic.  EYES: Pupils equal, round, reactive to light. Extraocular muscles intact. No scleral icterus.  MOUTH: Moist mucosal membrane. Dentition intact. No abscess noted.  EAR, NOSE, THROAT: Clear without exudates. No external lesions.  NECK: Supple. No thyromegaly. No nodules. No JVD.  PULMONARY: Bilateral rhonchi improved from previous CARDIOVASCULAR: S1 and S2. Regular rate and rhythm. No murmurs, rubs, or gallops. No edema. Pedal pulses 2+ bilaterally.  GASTROINTESTINAL: Soft, nontender, nondistended. No masses. Positive bowel sounds. No hepatosplenomegaly.  MUSCULOSKELETAL: No swelling, clubbing, or edema. Range of motion full in all extremities.  NEUROLOGIC: Cranial nerves II through XII are intact. No gross focal neurological deficits. Sensation intact. Reflexes intact.  SKIN: No ulceration, lesions, rashes, or cyanosis. Skin warm and dry. Turgor intact.  PSYCHIATRIC: Mood, affect within normal limits. The patient is awake, alert and oriented x 3. Insight, judgment intact.       IMAGING    CT Angio Chest PE W/Cm &/Or Wo Cm  Result Date: 01/13/2021 CLINICAL DATA:  Respiratory distress, hypoxia EXAM: CT ANGIOGRAPHY CHEST WITH CONTRAST TECHNIQUE: Multidetector CT imaging of the chest was performed using the standard protocol during bolus administration of intravenous contrast. Multiplanar CT image reconstructions and MIPs were obtained to evaluate the vascular anatomy. CONTRAST:  48m OMNIPAQUE IOHEXOL 350 MG/ML SOLN COMPARISON:  10/26/2020, 01/13/2021 FINDINGS: Cardiovascular: This is a technically adequate evaluation of the pulmonary vasculature. No filling defects or pulmonary emboli. The heart is  unremarkable without pericardial effusion. No evidence  of thoracic aortic aneurysm or dissection. Right chest wall port via internal jugular approach tip within the superior vena cava. Mediastinum/Nodes: No pathologic adenopathy within the mediastinum, hila, or axilla. Thyroid, trachea, and esophagus are unremarkable. Lungs/Pleura: There has been interval progression of the multifocal ground-glass airspace disease seen previously. Changes are most pronounced within the dependent lower lobes. No effusion or pneumothorax. The central airways are patent, with persistent bronchiectasis. Upper Abdomen: No acute abnormality. Musculoskeletal: No acute or destructive bony lesions. Reconstructed images demonstrate no additional findings. Review of the MIP images confirms the above findings. IMPRESSION: 1. No evidence of pulmonary embolus. 2. Progressive multifocal bilateral ground-glass airspace disease, greatest at the lung bases. The differential diagnosis would include progressive postinflammatory scarring and fibrosis, multifocal atypical pneumonia, or hypersensitivity/drug toxicity. 3. Stable bronchiectasis. Electronically Signed   By: Randa Ngo M.D.   On: 01/13/2021 17:13   DG Chest Port 1 View  Result Date: 01/30/2021 CLINICAL DATA:  Acute on chronic hypoxemic respiratory failure. EXAM: PORTABLE CHEST 1 VIEW COMPARISON:  January 28, 2021 FINDINGS: No pneumothorax. Stable cardiomediastinal silhouette and right Port-A-Cath. Left-greater-than-right pulmonary opacities/infiltrates. No other interval changes. IMPRESSION: 1. Persistent low lung volumes and bilateral pulmonary infiltrates. No interval changes. Electronically Signed   By: Dorise Bullion III M.D.   On: 01/30/2021 08:36   DG Chest Port 1 View  Result Date: 01/28/2021 CLINICAL DATA:  Shortness of breath. EXAM: PORTABLE CHEST 1 VIEW COMPARISON:  Multiple recent chest films. FINDINGS: The right IJ power port is stable. Persistent low lung volumes  and bilateral pulmonary infiltrates. No definite pleural effusion or pneumothorax. IMPRESSION: Persistent low lung volumes and bilateral pulmonary infiltrates. Electronically Signed   By: Marijo Sanes M.D.   On: 01/28/2021 16:03   DG Chest Port 1 View  Result Date: 01/26/2021 CLINICAL DATA:  Shortness of breath, abnormal chest x-ray EXAM: PORTABLE CHEST 1 VIEW COMPARISON:  01/24/2021 FINDINGS: Right Port-A-Cath remains in place, unchanged. Low lung volumes with bilateral airspace opacities, most pronounced in the left perihilar and lower lobe, unchanged since prior study. No visible effusions or pneumothorax. Heart is normal size. IMPRESSION: Low lung volumes with bilateral airspace disease, left-greater-than-right. No significant change. Electronically Signed   By: Rolm Baptise M.D.   On: 01/26/2021 09:42   DG Chest Port 1 View  Result Date: 01/24/2021 CLINICAL DATA:  Follow-up multifocal pneumonia. EXAM: PORTABLE CHEST 1 VIEW COMPARISON:  01/22/2021 and older exams. FINDINGS: Mild interval decrease in the airspace lung opacities, most evident on the left. Lung volumes remain low. No new lung abnormalities. No visualized pleural effusion.  No pneumothorax. Right internal jugular Port-A-Cath is stable. IMPRESSION: 1. Mild interval improvement in lung aeration with decreased airspace lung opacities, most notably on the left. Electronically Signed   By: Lajean Manes M.D.   On: 01/24/2021 07:57   DG Chest Port 1 View  Result Date: 01/22/2021 CLINICAL DATA:  Hypoxia, shortness of breath. EXAM: PORTABLE CHEST 1 VIEW COMPARISON:  January 20, 2021. FINDINGS: Stable cardiomediastinal silhouette. Right internal jugular Port-A-Cath is unchanged in position. Hypoinflation of the lungs is noted. Stable bilateral lung opacities are noted, left greater than right, most consistent with multifocal pneumonia. Bony thorax is unremarkable. IMPRESSION: Hypoinflation of the lungs. Stable bilateral lung opacities are  noted, left greater than right, most consistent with multifocal pneumonia. Electronically Signed   By: Marijo Conception M.D.   On: 01/22/2021 19:26   DG Chest Port 1 View  Result Date: 01/20/2021 CLINICAL DATA:  Pneumonitis EXAM:  PORTABLE CHEST 1 VIEW COMPARISON:  01/19/2021 FINDINGS: Shallow inspiration. Patchy infiltrates in the left lung and right lung base, similar to prior study and likely multifocal pneumonia. No pleural effusions. No pneumothorax. Mediastinal contours appear intact. Heart size is normal. Power port type central venous catheter on the right with tip over the cavoatrial junction region. Surgical clips in the right upper quadrant. IMPRESSION: Shallow inspiration. Patchy infiltrates in both lungs, likely multifocal pneumonia, without change. Electronically Signed   By: Lucienne Capers M.D.   On: 01/20/2021 03:56   DG Chest Port 1 View  Result Date: 01/19/2021 CLINICAL DATA:  Abnormality on previous chest x-ray in a 60 year old male. EXAM: PORTABLE CHEST 1 VIEW COMPARISON:  CT from January 13, 2021, chest x-ray January 18, 2021. FINDINGS: RIGHT-sided Port-A-Cath terminates at the caval to atrial junction. Trachea is midline. Cardiomediastinal contours are stable. Interstitial and airspace opacities throughout the chest worse in the LEFT mid to upper chest, RIGHT upper chest lung bases bilaterally with similar appearance. On limited assessment there is no acute skeletal process. EKG leads project over the chest. IMPRESSION: No interval change in the appearance of the chest since previous imaging with continued decreased lung volumes and patchy bilateral opacities. Electronically Signed   By: Zetta Bills M.D.   On: 01/19/2021 07:59   DG Chest Port 1 View  Result Date: 01/18/2021 CLINICAL DATA:  Respiratory distress EXAM: PORTABLE CHEST 1 VIEW COMPARISON:  01/17/2021 FINDINGS: Cardiac shadow is stable. Right-sided chest wall port is again seen. Lungs are hypoinflated. Patchy  airspace opacities again identified in both lungs left greater than right but stable from the prior exam. IMPRESSION: Patchy airspace opacities bilaterally stable from the prior exam. Electronically Signed   By: Inez Catalina M.D.   On: 01/18/2021 17:24   DG Chest Port 1 View  Result Date: 01/17/2021 CLINICAL DATA:  Worsening shortness of breath. EXAM: PORTABLE CHEST 1 VIEW COMPARISON:  01/14/2021 FINDINGS: 0826 hours. Low volume film. Patchy airspace disease again noted in both lungs, left greater than right. Cardiopericardial silhouette is at upper limits of normal for size. Right Port-A-Cath again noted. Telemetry leads overlie the chest. IMPRESSION: Low volume film with patchy bilateral airspace disease, left greater than right. No substantial change. Electronically Signed   By: Misty Stanley M.D.   On: 01/17/2021 08:53   DG Chest Port 1 View  Result Date: 01/14/2021 CLINICAL DATA:  Shortness of breath.  History of cancer.  Diabetes. EXAM: PORTABLE CHEST 1 VIEW.  Patient is rotated. COMPARISON:  Chest x-ray 01/13/2021, CT chest 01/13/2021 FINDINGS: Accessed right chest wall Port-A-Cath in stable position. The heart and mediastinal contours are within normal limits. Low lung volumes with persistent diffuse patchy airspace opacities. No pleural effusion. No pneumothorax. No acute osseous abnormality. Right upper quadrant surgical clips. IMPRESSION: Low lung volumes with persistent diffuse patchy airspace opacities. Electronically Signed   By: Iven Finn M.D.   On: 01/14/2021 21:08   DG Chest Port 1 View  Result Date: 01/13/2021 CLINICAL DATA:  Shortness of breath EXAM: PORTABLE CHEST 1 VIEW COMPARISON:  Chest radiograph 07/29/2019 FINDINGS: There is a right chest wall port with the tip terminating in the lower SVC/cavoatrial junction. The cardiomediastinal silhouette is within normal limits. Lung volumes are low with unchanged asymmetric elevation of the right hemidiaphragm. There are increased  interstitial markings with diffuse reticular opacities throughout both lungs. There is no focal consolidation. There is no significant pleural effusion. There is no pneumothorax. There is no acute osseous abnormality.  There is gaseous distention of the stomach and large bowel in the right upper quadrant. IMPRESSION: Low lung volumes with suspected mild pulmonary interstitial edema. Atypical/viral infection could have a similar appearance. Electronically Signed   By: Valetta Mole M.D.   On: 01/13/2021 14:24           ASSESSMENT/PLAN   Acute hypoxemic respiratory failure Due to acute exacerbation of interstitial lung disease  - present on admission  - COVID19 negative   - supplemental O2 during my evaluation 10l/min>>8L>>HFNC  -Respiratory viral panel-negative  -serum fungitell-negative  -legionella ab-negative  -nasal MRSA PCR-negative -Procalcitonin trend reviewed -strep pneumoniae ur AG -Histoplasma Ur Ag-negative -sputum resp cultures -AFB sputum expectorated specimen -sputum cytology  -completed  Cefepime course for cap -Reduced Solumedrol to 40 BID - 01/15/21>>50 BID>>40 BID>>30 BID>>20 BID>>pred 50 -reviewed pertinent imaging with patient today - ESR and CRP are elevated but improving -PT/OT for d/c planning  -please encourage patient to use incentive spirometer few times each hour while hospitalized.   -continue Cellcept 250 bid >>500 bid  -dcd lasix 01/28/21 -UOP is adequate -Discussed goal of care - recommend DNR patient is in agreement but still discussing with wife.  -lasix 40 x 1 today - 01/30/21  Acute blood loss anemia-resolved    - noted Hb trending down from 14 to 10 -    -fecal occult blood test    - may be due to GI bleed due to eliquis - monitor for grossly visible bleeding and stable vital sings    Thank you for allowing me to participate in the care of this patient.   Patient/Family are satisfied with care plan and all questions have been answered.    This document was prepared using Dragon voice recognition software and may include unintentional dictation errors.     Ottie Glazier, M.D.  Division of [facility]

## 2021-01-30 NOTE — Progress Notes (Addendum)
Patient assisted to [facility] by NT Helene Kelp and RN Lysbeth Galas. Patient experienced extreme respiratory and emotional distress. Saturation down to 52% without improvement at 15L HFNC. RT Joanne Chars called to bedside and placed patient on BiPap. Patient recovered slowly and felt like he could not get oxygen in through his nose. Saturation up to 60% on BiPap. Continued on BiPap for a short few minutes. RT transitioned patient over to [facility] at this time. 40L at 90%.  MD Wieiting to bedside shortly after patient has recovered to 90%. Ordered 40mg  Lasix administered and  PRN Ativan to help relax patient in attempt to lower respiratory rate.  CBG obtained during distress as patient felt like he was "going to pass out and die." CBG 198.  Patient back in bed and comfortable at this time. States he feels much better but said he is not comfortable getting out of bed for awhile. I will continue to monitor patient very closely.

## 2021-01-31 DIAGNOSIS — J849 Interstitial pulmonary disease, unspecified: Secondary | ICD-10-CM | POA: Diagnosis not present

## 2021-01-31 DIAGNOSIS — J9621 Acute and chronic respiratory failure with hypoxia: Secondary | ICD-10-CM | POA: Diagnosis not present

## 2021-01-31 DIAGNOSIS — E11649 Type 2 diabetes mellitus with hypoglycemia without coma: Secondary | ICD-10-CM | POA: Diagnosis not present

## 2021-01-31 DIAGNOSIS — D72829 Elevated white blood cell count, unspecified: Secondary | ICD-10-CM | POA: Diagnosis not present

## 2021-01-31 LAB — CBC
HCT: 30.2 % — ABNORMAL LOW (ref 39.0–52.0)
Hemoglobin: 10.3 g/dL — ABNORMAL LOW (ref 13.0–17.0)
MCH: 28.5 pg (ref 26.0–34.0)
MCHC: 34.1 g/dL (ref 30.0–36.0)
MCV: 83.7 fL (ref 80.0–100.0)
Platelets: 182 10*3/uL (ref 150–400)
RBC: 3.61 MIL/uL — ABNORMAL LOW (ref 4.22–5.81)
RDW: 17.9 % — ABNORMAL HIGH (ref 11.5–15.5)
WBC: 18.3 10*3/uL — ABNORMAL HIGH (ref 4.0–10.5)
nRBC: 0 % (ref 0.0–0.2)

## 2021-01-31 LAB — BASIC METABOLIC PANEL
Anion gap: 9 (ref 5–15)
BUN: 18 mg/dL (ref 8–23)
CO2: 33 mmol/L — ABNORMAL HIGH (ref 22–32)
Calcium: 8 mg/dL — ABNORMAL LOW (ref 8.9–10.3)
Chloride: 92 mmol/L — ABNORMAL LOW (ref 98–111)
Creatinine, Ser: 0.58 mg/dL — ABNORMAL LOW (ref 0.61–1.24)
GFR, Estimated: >60 mL/min (ref >60)
Glucose, Bld: 130 mg/dL — ABNORMAL HIGH (ref 70–99)
Potassium: 3.8 mmol/L (ref 3.5–5.1)
Sodium: 134 mmol/L — ABNORMAL LOW (ref 135–145)

## 2021-01-31 LAB — GLUCOSE, CAPILLARY
Glucose-Capillary: 156 mg/dL — ABNORMAL HIGH (ref 70–99)
Glucose-Capillary: 196 mg/dL — ABNORMAL HIGH (ref 70–99)
Glucose-Capillary: 219 mg/dL — ABNORMAL HIGH (ref 70–99)
Glucose-Capillary: 308 mg/dL — ABNORMAL HIGH (ref 70–99)

## 2021-01-31 NOTE — Progress Notes (Signed)
Pulmonary Medicine          Date: 01/31/2021,   MRN# [id number] Edward Atkinson Apr 07, 1961     AdmissionWeight: 63.5 kg                 CurrentWeight: 68.3 kg   Referring physician: Dr Roderic Palau   CHIEF COMPLAINT:   Acute on chronic hypoxemic respiratory failure   HISTORY OF PRESENT ILLNESS   60 yo M w/hx of chronic hypoxemia and ILD post COVID19, T2DM, PE on eliquis, Lymphoma s/p Rituxan which is completed.  Came in due to worsening SOB and DOE specifically mild exertional defacation/urination with feelings of presyncope and severe dyspnea which started to get worse post tapering from steroid appx few days prior to onset of symptom progression.  He was placed on solumedrol and reported mild improvement.  He is on 10L /min [place] during my evaluation.  He had CT chest done with PE protocol and noted to have bilateral GGO infiltrates worse compared to June study. PCCM consultation for further evaluation and management.     01/17/21-  patient seems to be slighly clinically worse post reduction of IV steroids yesterday indicative of ongoing ILD exacerbation, thus far infectious workup is negative but inflammatory biomarkers are highly elevated also pointing towards ILD exacerbation.  There is new CXR in process this morning for interval changes. We discussed increasing steroids slightly and giving more time.  I will also initiate steroid sparing immunomodulation with BID cellcept as patient will likely need therapy for prolonged time period.  Compensatory tachycardia noted appreciate cardiology team.  Sugars with peaks and valleys with adjusted steroid therapy.  Patient with muscle wasting and protein calorie malnutrition but eating better this am.  01/18/21- patient is on HFNC at 60%, feels better, has not been able to tolerate BIPAP.  Wife is at bedside.  BP is borderline low MAP 76.  He has negative infectious workup and thus far ILD exacerbation seems to be main differential. He  continues to have peaks and valleys in blood glucose.  We started steroid sparing regimen with cellcept bid 250 mg and will increase dose as we taper off prednisone.  Remains tachyarrtythmic but improved. Appreciate everyone involved.     01/19/21- patient seen and examined at bedside.  Wife present during evaluation.  Ow HFNC weaned to 41%/45L/MIN.  CRP is still up, have increased cellcept and reducing solumedrol.  Patient felt stronger physically and was able to get OOB to chair. Cough is less now.  He is non-labored with breathing no accessory mm use today. He had dark stools with reduced hb, FOBT ordred.  Haptoglobin is elevated. Pathology slide review ordered.   01/20/21- patient is essentially unchanged from yesterday from pulmonary perspective. CXR also unchanged,  bloodwork with reduction in CRP.  Plan to wean O2 and continue OOB with PT/OT,  Continue current steroid dose and Cellcept.   01/21/21- patient is stable on HFNC.  He had small set back with increased O2 req after exerting himself in bathroom.  HFNC from 41/45 to 54/45.   He diuresed very well with lasix 20 bid >2L urine in past 24h.  Tachycardia is improved. He does not feel weaker and infact states he feels slowly improved.   01/22/21- I spoke to patient regarding goals of care and recommend DNR.  He is agreeable but I asked him to discuss with wife and family out of respect and we will revisit tommorow.  He states he is feeling better  and CRP is trending down but he remains on HFNC 60/40.  He was able to get OOB today and walked to bathroom but experienced mild desaturation with quick recovery "not much issues".      01/23/21- patient is still on high flow nasal canula , he is on 56%/45L.  We reviewed code status and he is still in discussion with family regarding DNR.   We discussed lung transplant per family request and this may be a future option if he is strong enough and recovers.  I have increased insluin TID to 20 from 15 and  increased bactrim to bid SS   01/24/21- patient weaned on HFNC to 49/45, he was able to use BIPAP overnight thinks its helping.  CXR this AM with interval improvement. Will keep on current dose of meds.  Repeat CRP in am.  Last BM yesterday well formed no blood, eating good, urine output very good on 20 bid lasix. Getting OOB with  transient desaturation.  Overall slight improvement.    01/25/21- patient has weaned down to 35%/40L/min.  He remains full code. Reviewed medical plan with Dr Jimmye Norman today.  Reduced steroids from 50 BID to 40 BID and also reduced insulin slightly.    01/26/21- patient is further weaned to 15L humidified HF nasal canula.  He did PT ROM exercises with mild desaturation. CXR today with expiratory image unable to see much interval changes. Patient reports improvement clinically. Plan to continue current regimen. Sugars slightly high due to steroids, will wean steroids soon. Continue cellcept as current 500 bid  01/27/21- patient is relatively same as yesterday, his O2 is weaned down to 14L.  He remains on IV solumedrol at 50 bid, we did discuss weaning this further to 40 BID. Will keep cellcept at current dose 500 bid.  His ankles have been down in seated position and are edematous on examination. He continues to participate with PT and feels good subjectively. We discussed starting dose of lasix for pedal edema.    01/28/21- patient examined at bedside, s/p PT/OT session with slight dyspnea.  He was weaned to 10L O2 but then went back up to 13 post PT due to increased WOB.  He has complete resolution of LE edema and appears to be euvolemic at this point. We discussed reducing soluemdrol today to 20 bid.  I reviewed plan with Dr Leslye Peer today.  01/29/21- patient examined at bedside.  He had a good day and feels better. We reviewed medical plan and are reducing steroids to PO prednisone starting at 1m daily.   01/30/21-  Patient seen and examined at bedside.  CXR this am with  worsening pulmonary edema, included below.  Vitals stable with mild tachypnea and tachycardia with activity. Will deliver 1 dose IV lasix 20 today. Plan to continue Pred PO and cellcept at 500 bid. Pulmonary intensive PT with IS and will initiate Metaneb therapy with albuterol. Blood work today will be repeated CRP CMP phos procal ESR.  Patient is agreeable to BIPAP QHS and Metaneb BID.   01/31/21-  patient had set back yesterday with mild flash pulm edema on CXR. He diuresed very well overnight and feels back to where he was at previously.  He had transient reactive leukocytosis as well.  We initiated empirically zosyn and zithromax on top of bid bactrim.  This probably could be dcd since he recovered so quickly which would not occur with pneumonia.  Ill go head and dc this today but continue PO bactrim.  Will stay on pred 50 today with cellcept at 500 bid as previous.  His O2 has been reduced from 95 to 70% overnight and hes working with PT today. Hopefully we can wean this back down to 10L as he was previosly then back to his home setting.   PAST MEDICAL HISTORY   Past Medical History:  Diagnosis Date  . Chronic respiratory failure with hypoxia (HCC)    2L Many continuous  . COVID-19   . Diabetes mellitus without complication ([place])   . Diverticulitis   . Follicular lymphoma of intra-abdominal lymph nodes ([place]) 02/15/2019  . ILD (interstitial lung disease) ([place])      SURGICAL HISTORY   Past Surgical History:  Procedure Laterality Date  . CHOLECYSTECTOMY    . COLON RESECTION     DUE TO DIVERTICULITIS  . FLEXIBLE BRONCHOSCOPY N/A 12/02/2019   Procedure: FLEXIBLE BRONCHOSCOPY;  Surgeon: Ottie Glazier, MD;  Location: [facility];  Service: Thoracic;  Laterality: N/A;  . PORTA CATH INSERTION N/A 03/11/2019   Procedure: PORTA CATH INSERTION;  Surgeon: Algernon Huxley, MD;  Location: [facility];  Service: Cardiovascular;  Laterality: N/A;  . PULMONARY THROMBECTOMY N/A 01/29/2019   Procedure:  PULMONARY THROMBECTOMY;  Surgeon: Algernon Huxley, MD;  Location: [facility];  Service: Cardiovascular;  Laterality: N/A;     FAMILY HISTORY   Family History  Problem Relation Age of Onset  . Diabetes Brother      SOCIAL HISTORY   Social History   Tobacco Use  . Smoking status: Never  . Smokeless tobacco: Never  Vaping Use  . Vaping Use: Never used  Substance Use Topics  . Alcohol use: Yes    Alcohol/week: 0.0 - 2.0 standard drinks    Comment: occasional  . Drug use: Never     MEDICATIONS    Home Medication:    Current Medication:  Current Facility-Administered Medications:  .  acetaminophen (TYLENOL) tablet 650 mg, 650 mg, Oral, Q6H PRN, 650 mg at 01/18/21 1411 **OR** acetaminophen (TYLENOL) suppository 650 mg, 650 mg, Rectal, Q6H PRN, Howerter, Justin B, DO .  acyclovir (ZOVIRAX) 200 MG capsule 400 mg, 400 mg, Oral, BID, Kathie Dike, MD, 400 mg at 01/31/21 0823 .  ALPRAZolam Duanne Moron) tablet 0.25 mg, 0.25 mg, Oral, BID PRN, Leslye Peer, Richard, MD, 0.25 mg at 01/30/21 1110 .  apixaban (ELIQUIS) tablet 5 mg, 5 mg, Oral, BID, Beers, Shanon Brow, RPH, 5 mg at 01/31/21 0825 .  [COMPLETED] azithromycin (ZITHROMAX) tablet 500 mg, 500 mg, Oral, Daily, 500 mg at 01/30/21 1617 **FOLLOWED BY** azithromycin (ZITHROMAX) tablet 250 mg, 250 mg, Oral, Daily, Leslye Peer, Richard, MD, 250 mg at 01/31/21 0824 .  Chlorhexidine Gluconate Cloth 2 % PADS 6 each, 6 each, Topical, Daily, Kathie Dike, MD, 6 each at 01/31/21 0826 .  digoxin (LANOXIN) tablet 0.125 mg, 0.125 mg, Oral, Daily, Memon, Jolaine Artist, MD, 0.125 mg at 01/31/21 0824 .  diltiazem (CARDIZEM CD) 24 hr capsule 180 mg, 180 mg, Oral, Daily, Paraschos, Alexander, MD, 180 mg at 01/31/21 0825 .  diltiazem (CARDIZEM) injection 10 mg, 10 mg, Intravenous, Q6H PRN, Wyvonnia Dusky, MD, 10 mg at 01/24/21 2328 .  docusate sodium (COLACE) capsule 200 mg, 200 mg, Oral, BID, Wyvonnia Dusky, MD, 200 mg at 01/31/21 6962 .   feeding supplement (NEPRO CARB STEADY) liquid 237 mL, 237 mL, Oral, TID BM, Memon, Jehanzeb, MD, Last Rate: 0 mL/hr at 01/21/21 2130, 237 mL at 01/31/21 0828 .  furosemide (LASIX) injection  40 mg, 40 mg, Intravenous, Daily, Ottie Glazier, MD, 40 mg at 01/31/21 0826 .  guaiFENesin (MUCINEX) 12 hr tablet 1,200 mg, 1,200 mg, Oral, BID, Kathie Dike, MD, 1,200 mg at 01/30/21 2154 .  guaiFENesin-dextromethorphan (ROBITUSSIN DM) 100-10 MG/5ML syrup 5 mL, 5 mL, Oral, Q4H PRN, Kathie Dike, MD, 5 mL at 01/17/21 0648 .  insulin aspart (novoLOG) injection 0-20 Units, 0-20 Units, Subcutaneous, TID WC, Kathie Dike, MD, 4 Units at 01/31/21 [phone] .  insulin aspart (novoLOG) injection 0-5 Units, 0-5 Units, Subcutaneous, QHS, Kathie Dike, MD, 2 Units at 01/28/21 2154 .  insulin aspart (novoLOG) injection 10 Units, 10 Units, Subcutaneous, TID WC, Loletha Grayer, MD, 10 Units at 01/31/21 [phone] .  insulin glargine-yfgn (SEMGLEE) injection 14 Units, 14 Units, Subcutaneous, QHS, Loletha Grayer, MD, 14 Units at 01/30/21 2215 .  levalbuterol (XOPENEX) nebulizer solution 1.25 mg, 1.25 mg, Nebulization, Q4H PRN, Howerter, Justin B, DO, 1.25 mg at 01/17/21 2011 .  multivitamin with minerals tablet 1 tablet, 1 tablet, Oral, Daily, Kathie Dike, MD, 1 tablet at 01/31/21 0823 .  mycophenolate (CELLCEPT) capsule 500 mg, 500 mg, Oral, BID, Ottie Glazier, MD, 500 mg at 01/31/21 0824 .  piperacillin-tazobactam (ZOSYN) IVPB 3.375 g, 3.375 g, Intravenous, Q8H, Beers, Shanon Brow, RPH, Last Rate: 12.5 mL/hr at 01/31/21 0606, 3.375 g at 01/31/21 0606 .  polyethylene glycol (MIRALAX / GLYCOLAX) packet 17 g, 17 g, Oral, Daily, Leslye Peer, Richard, MD, 17 g at 01/31/21 0820 .  predniSONE (DELTASONE) tablet 50 mg, 50 mg, Oral, Q breakfast, Sherryn Pollino, MD, 50 mg at 01/31/21 0825 .  simethicone (MYLICON) chewable tablet 80 mg, 80 mg, Oral, QID PRN, Loletha Grayer, MD, 80 mg at 01/29/21 1411 .  sodium chloride (OCEAN)  0.65 % nasal spray 1 spray, 1 spray, Each Nare, PRN, Loletha Grayer, MD, 1 spray at 01/30/21 2142 .  sodium chloride flush (NS) 0.9 % injection 10-40 mL, 10-40 mL, Intracatheter, Q12H, Memon, Jolaine Artist, MD, 10 mL at 01/31/21 0827 .  sodium chloride flush (NS) 0.9 % injection 10-40 mL, 10-40 mL, Intracatheter, PRN, Kathie Dike, MD, 10 mL at 01/24/21 0915 .  sulfamethoxazole-trimethoprim (BACTRIM) 400-80 MG per tablet 1 tablet, 1 tablet, Oral, Q12H, Ottie Glazier, MD, 1 tablet at 01/31/21 0824 .  traZODone (DESYREL) tablet 50 mg, 50 mg, Oral, QHS PRN, Sharion Settler, NP, 50 mg at 01/30/21 2155  Facility-Administered Medications Ordered in Other Encounters:  .  heparin lock flush 100 unit/mL, 500 Units, Intravenous, Once, Earlie Server, MD    ALLERGIES   Penicillins     REVIEW OF SYSTEMS    Review of Systems:  Gen:  Denies  fever, sweats, chills weigh loss  HEENT: Denies blurred vision, double vision, ear pain, eye pain, hearing loss, nose bleeds, sore throat Cardiac:  No dizziness, chest pain or heaviness, chest tightness,edema Resp:   Denies cough or sputum porduction, shortness of breath,wheezing, hemoptysis,  Gi: Denies swallowing difficulty, stomach pain, nausea or vomiting, diarrhea, constipation, bowel incontinence Gu:  Denies bladder incontinence, burning urine Ext:   Denies Joint pain, stiffness or swelling Skin: Denies  skin rash, easy bruising or bleeding or hives Endoc:  Denies polyuria, polydipsia , polyphagia or weight change Psych:   Denies depression, insomnia or hallucinations   Other:  All other systems negative   VS: BP 100/65 (BP Location: Right Arm)   Pulse 99   Temp 97.8 F (36.6 C) (Oral)   Resp (!) 24   Ht 6' (1.829 m)   Wt 68.3 kg  SpO2 98%   BMI 20.41 kg/m      PHYSICAL EXAM    GENERAL:NAD, no fevers, chills, no weakness no fatigue HEAD: Normocephalic, atraumatic.  EYES: Pupils equal, round, reactive to light. Extraocular muscles  intact. No scleral icterus.  MOUTH: Moist mucosal membrane. Dentition intact. No abscess noted.  EAR, NOSE, THROAT: Clear without exudates. No external lesions.  NECK: Supple. No thyromegaly. No nodules. No JVD.  PULMONARY: Bilateral rhonchi improved from previous CARDIOVASCULAR: S1 and S2. Regular rate and rhythm. No murmurs, rubs, or gallops. No edema. Pedal pulses 2+ bilaterally.  GASTROINTESTINAL: Soft, nontender, nondistended. No masses. Positive bowel sounds. No hepatosplenomegaly.  MUSCULOSKELETAL: No swelling, clubbing, or edema. Range of motion full in all extremities.  NEUROLOGIC: Cranial nerves II through XII are intact. No gross focal neurological deficits. Sensation intact. Reflexes intact.  SKIN: No ulceration, lesions, rashes, or cyanosis. Skin warm and dry. Turgor intact.  PSYCHIATRIC: Mood, affect within normal limits. The patient is awake, alert and oriented x 3. Insight, judgment intact.       IMAGING    CT Angio Chest PE W/Cm &/Or Wo Cm  Result Date: 01/13/2021 CLINICAL DATA:  Respiratory distress, hypoxia EXAM: CT ANGIOGRAPHY CHEST WITH CONTRAST TECHNIQUE: Multidetector CT imaging of the chest was performed using the standard protocol during bolus administration of intravenous contrast. Multiplanar CT image reconstructions and MIPs were obtained to evaluate the vascular anatomy. CONTRAST:  38m OMNIPAQUE IOHEXOL 350 MG/ML SOLN COMPARISON:  10/26/2020, 01/13/2021 FINDINGS: Cardiovascular: This is a technically adequate evaluation of the pulmonary vasculature. No filling defects or pulmonary emboli. The heart is unremarkable without pericardial effusion. No evidence of thoracic aortic aneurysm or dissection. Right chest wall port via internal jugular approach tip within the superior vena cava. Mediastinum/Nodes: No pathologic adenopathy within the mediastinum, hila, or axilla. Thyroid, trachea, and esophagus are unremarkable. Lungs/Pleura: There has been interval progression of  the multifocal ground-glass airspace disease seen previously. Changes are most pronounced within the dependent lower lobes. No effusion or pneumothorax. The central airways are patent, with persistent bronchiectasis. Upper Abdomen: No acute abnormality. Musculoskeletal: No acute or destructive bony lesions. Reconstructed images demonstrate no additional findings. Review of the MIP images confirms the above findings. IMPRESSION: 1. No evidence of pulmonary embolus. 2. Progressive multifocal bilateral ground-glass airspace disease, greatest at the lung bases. The differential diagnosis would include progressive postinflammatory scarring and fibrosis, multifocal atypical pneumonia, or hypersensitivity/drug toxicity. 3. Stable bronchiectasis. Electronically Signed   By: MRanda NgoM.D.   On: 01/13/2021 17:13   DG Chest Port 1 View  Result Date: 01/30/2021 CLINICAL DATA:  Acute on chronic hypoxemic respiratory failure. EXAM: PORTABLE CHEST 1 VIEW COMPARISON:  January 28, 2021 FINDINGS: No pneumothorax. Stable cardiomediastinal silhouette and right Port-A-Cath. Left-greater-than-right pulmonary opacities/infiltrates. No other interval changes. IMPRESSION: 1. Persistent low lung volumes and bilateral pulmonary infiltrates. No interval changes. Electronically Signed   By: DDorise BullionIII M.D.   On: 01/30/2021 08:36   DG Chest Port 1 View  Result Date: 01/28/2021 CLINICAL DATA:  Shortness of breath. EXAM: PORTABLE CHEST 1 VIEW COMPARISON:  Multiple recent chest films. FINDINGS: The right IJ power port is stable. Persistent low lung volumes and bilateral pulmonary infiltrates. No definite pleural effusion or pneumothorax. IMPRESSION: Persistent low lung volumes and bilateral pulmonary infiltrates. Electronically Signed   By: PMarijo SanesM.D.   On: 01/28/2021 16:03   DG Chest Port 1 View  Result Date: 01/26/2021 CLINICAL DATA:  Shortness of breath, abnormal chest x-ray EXAM: PORTABLE  CHEST 1 VIEW  COMPARISON:  01/24/2021 FINDINGS: Right Port-A-Cath remains in place, unchanged. Low lung volumes with bilateral airspace opacities, most pronounced in the left perihilar and lower lobe, unchanged since prior study. No visible effusions or pneumothorax. Heart is normal size. IMPRESSION: Low lung volumes with bilateral airspace disease, left-greater-than-right. No significant change. Electronically Signed   By: Rolm Baptise M.D.   On: 01/26/2021 09:42   DG Chest Port 1 View  Result Date: 01/24/2021 CLINICAL DATA:  Follow-up multifocal pneumonia. EXAM: PORTABLE CHEST 1 VIEW COMPARISON:  01/22/2021 and older exams. FINDINGS: Mild interval decrease in the airspace lung opacities, most evident on the left. Lung volumes remain low. No new lung abnormalities. No visualized pleural effusion.  No pneumothorax. Right internal jugular Port-A-Cath is stable. IMPRESSION: 1. Mild interval improvement in lung aeration with decreased airspace lung opacities, most notably on the left. Electronically Signed   By: Lajean Manes M.D.   On: 01/24/2021 07:57   DG Chest Port 1 View  Result Date: 01/22/2021 CLINICAL DATA:  Hypoxia, shortness of breath. EXAM: PORTABLE CHEST 1 VIEW COMPARISON:  January 20, 2021. FINDINGS: Stable cardiomediastinal silhouette. Right internal jugular Port-A-Cath is unchanged in position. Hypoinflation of the lungs is noted. Stable bilateral lung opacities are noted, left greater than right, most consistent with multifocal pneumonia. Bony thorax is unremarkable. IMPRESSION: Hypoinflation of the lungs. Stable bilateral lung opacities are noted, left greater than right, most consistent with multifocal pneumonia. Electronically Signed   By: Marijo Conception M.D.   On: 01/22/2021 19:26   DG Chest Port 1 View  Result Date: 01/20/2021 CLINICAL DATA:  Pneumonitis EXAM: PORTABLE CHEST 1 VIEW COMPARISON:  01/19/2021 FINDINGS: Shallow inspiration. Patchy infiltrates in the left lung and right lung base,  similar to prior study and likely multifocal pneumonia. No pleural effusions. No pneumothorax. Mediastinal contours appear intact. Heart size is normal. Power port type central venous catheter on the right with tip over the cavoatrial junction region. Surgical clips in the right upper quadrant. IMPRESSION: Shallow inspiration. Patchy infiltrates in both lungs, likely multifocal pneumonia, without change. Electronically Signed   By: Lucienne Capers M.D.   On: 01/20/2021 03:56   DG Chest Port 1 View  Result Date: 01/19/2021 CLINICAL DATA:  Abnormality on previous chest x-ray in a 60 year old male. EXAM: PORTABLE CHEST 1 VIEW COMPARISON:  CT from January 13, 2021, chest x-ray January 18, 2021. FINDINGS: RIGHT-sided Port-A-Cath terminates at the caval to atrial junction. Trachea is midline. Cardiomediastinal contours are stable. Interstitial and airspace opacities throughout the chest worse in the LEFT mid to upper chest, RIGHT upper chest lung bases bilaterally with similar appearance. On limited assessment there is no acute skeletal process. EKG leads project over the chest. IMPRESSION: No interval change in the appearance of the chest since previous imaging with continued decreased lung volumes and patchy bilateral opacities. Electronically Signed   By: Zetta Bills M.D.   On: 01/19/2021 07:59   DG Chest Port 1 View  Result Date: 01/18/2021 CLINICAL DATA:  Respiratory distress EXAM: PORTABLE CHEST 1 VIEW COMPARISON:  01/17/2021 FINDINGS: Cardiac shadow is stable. Right-sided chest wall port is again seen. Lungs are hypoinflated. Patchy airspace opacities again identified in both lungs left greater than right but stable from the prior exam. IMPRESSION: Patchy airspace opacities bilaterally stable from the prior exam. Electronically Signed   By: Inez Catalina M.D.   On: 01/18/2021 17:24   DG Chest Port 1 View  Result Date: 01/17/2021 CLINICAL DATA:  Worsening  shortness of breath. EXAM: PORTABLE CHEST  1 VIEW COMPARISON:  01/14/2021 FINDINGS: 0826 hours. Low volume film. Patchy airspace disease again noted in both lungs, left greater than right. Cardiopericardial silhouette is at upper limits of normal for size. Right Port-A-Cath again noted. Telemetry leads overlie the chest. IMPRESSION: Low volume film with patchy bilateral airspace disease, left greater than right. No substantial change. Electronically Signed   By: Misty Stanley M.D.   On: 01/17/2021 08:53   DG Chest Port 1 View  Result Date: 01/14/2021 CLINICAL DATA:  Shortness of breath.  History of cancer.  Diabetes. EXAM: PORTABLE CHEST 1 VIEW.  Patient is rotated. COMPARISON:  Chest x-ray 01/13/2021, CT chest 01/13/2021 FINDINGS: Accessed right chest wall Port-A-Cath in stable position. The heart and mediastinal contours are within normal limits. Low lung volumes with persistent diffuse patchy airspace opacities. No pleural effusion. No pneumothorax. No acute osseous abnormality. Right upper quadrant surgical clips. IMPRESSION: Low lung volumes with persistent diffuse patchy airspace opacities. Electronically Signed   By: Iven Finn M.D.   On: 01/14/2021 21:08   DG Chest Port 1 View  Result Date: 01/13/2021 CLINICAL DATA:  Shortness of breath EXAM: PORTABLE CHEST 1 VIEW COMPARISON:  Chest radiograph 07/29/2019 FINDINGS: There is a right chest wall port with the tip terminating in the lower SVC/cavoatrial junction. The cardiomediastinal silhouette is within normal limits. Lung volumes are low with unchanged asymmetric elevation of the right hemidiaphragm. There are increased interstitial markings with diffuse reticular opacities throughout both lungs. There is no focal consolidation. There is no significant pleural effusion. There is no pneumothorax. There is no acute osseous abnormality. There is gaseous distention of the stomach and large bowel in the right upper quadrant. IMPRESSION: Low lung volumes with suspected mild pulmonary  interstitial edema. Atypical/viral infection could have a similar appearance. Electronically Signed   By: Valetta Mole M.D.   On: 01/13/2021 14:24           ASSESSMENT/PLAN   Acute hypoxemic respiratory failure Due to acute exacerbation of interstitial lung disease  - present on admission  - COVID19 negative   - supplemental O2 during my evaluation 10l/min>>8L>>HFNC  -Respiratory viral panel-negative  -serum fungitell-negative  -legionella ab-negative  -nasal MRSA PCR-negative -Procalcitonin trend reviewed -strep pneumoniae ur AG -Histoplasma Ur Ag-negative -sputum resp cultures -AFB sputum expectorated specimen -sputum cytology  -completed  Cefepime course for cap -Reduced Solumedrol to 40 BID - 01/15/21>>50 BID>>40 BID>>30 BID>>20 BID>>pred 50 -reviewed pertinent imaging with patient today - ESR and CRP are elevated but improving -PT/OT for d/c planning  -please encourage patient to use incentive spirometer few times each hour while hospitalized.   -continue Cellcept 250 bid >>500 bid  -dcd lasix 01/28/21 -UOP is adequate -Discussed goal of care - recommend DNR patient is in agreement but still discussing with wife.  -lasix 40 x 1 today - 01/30/21  Acute blood loss anemia-resolved    - noted Hb trending down from 14 to 10 -    -fecal occult blood test    - may be due to GI bleed due to eliquis - monitor for grossly visible bleeding and stable vital sings    Thank you for allowing me to participate in the care of this patient.   Patient/Family are satisfied with care plan and all questions have been answered.   This document was prepared using Dragon voice recognition software and may include unintentional dictation errors.     Ottie Glazier, M.D.  Division of Pulmonary &  [place]

## 2021-01-31 NOTE — Progress Notes (Signed)
Physical Therapy Treatment Patient Details Name: Edward Atkinson MRN: [id number] DOB: 01/06/1961 Today's Date: 01/31/2021   History of Present Illness Pt is a 60 y.o. male with medical history significant for chronic hypoxic respiratory failure on 2 L continuous nasal cannula in the setting of interstitial lung disease, type 2 diabetes mellitus, pulmonary embolism in 2020 now chronically anticoagulated on Eliquis, who arrived to ED with shortness of breath. MD assessment includes:  acute on chronic hypoxic respiratory failure with hypoxia, possible sepsis due to bilateral pneumonia, rapid atrial fibrillation, anemia, and mild elevation of troponin with suspected demand ischemia.    PT Comments    Pt seen for PT tx with pt eager/asking to get to recliner & Dr.  Allen Kell present upon PT arrival, approving pt to get OOB & to titrate O2 as needed to maintain appropriate SPO2. Pt is able to complete supine>sit with mod I & sat EOB ~20 minutes with normal sitting balance with cuing for pursed lip breathing. After respiratory therapist arrived pt completed transfer to recliner with min assist +2. Pt left in care of nurse & RT. Pt would benefit from coordinating PT session with respiratory therapy to allow pt to maintain appropriate SpO2 & to mobilize pt as much as possible.  Pt received in 45 L HHFNC & 69% FiO2, pt 88% lying in bed, dropped as low as 79% while sitting EOB. Nurse increased SpO2 & donned non rebreather & pt required 15 minutes to increase to 87%. Respiratory therapist arrived & increased FiO2 & O2 & pt increased to 91%, dropping as low as 85% after transferring to recliner with PT leaving pt in care of RT & RN.   Recommendations for follow up therapy are one component of a multi-disciplinary discharge planning process, led by the attending physician.  Recommendations may be updated based on patient status, additional functional criteria and insurance authorization.  Follow Up Recommendations   Home health PT;Supervision for mobility/OOB     Equipment Recommendations  Rolling walker with 5" wheels;3in1 (PT)    Recommendations for Other Services       Precautions / Restrictions Precautions Precautions: Fall Restrictions Weight Bearing Restrictions: No     Mobility  Bed Mobility Overal bed mobility: Modified Independent             General bed mobility comments: supine>sit with use of hospital bed features    Transfers Overall transfer level: Needs assistance Equipment used: None Transfers: Stand Pivot Transfers Sit to Stand: Min guard;Min assist Stand pivot transfers: Min assist;+2 physical assistance;+2 safety/equipment       General transfer comment: Pt is able to transfer sit>stand from EOB & complete stand pivot bed>recliner with min assist +2 for HHA & equpiment management.  Ambulation/Gait                 Stairs             Wheelchair Mobility    Modified Rankin (Stroke Patients Only)       Balance Overall balance assessment: Needs assistance Sitting-balance support: Feet supported;No upper extremity supported Sitting balance-Leahy Scale: Normal Sitting balance - Comments: Pt tolerates sitting EOB ~20 minutes without LOB                                    Cognition Arousal/Alertness: Awake/alert Behavior During Therapy: WFL for tasks assessed/performed Overall Cognitive Status: Within Functional Limits for tasks assessed  General Comments: Very pleasant & agreeable to tx.      Exercises      General Comments        Pertinent Vitals/Pain Pain Assessment: No/denies pain    Home Living                      Prior Function            PT Goals (current goals can now be found in the care plan section) Acute Rehab PT Goals Patient Stated Goal: To get my strength back PT Goal Formulation: With patient Time For Goal Achievement:  02/16/21 Potential to Achieve Goals: Good Progress towards PT goals: Progressing toward goals    Frequency    Min 2X/week      PT Plan Current plan remains appropriate    Co-evaluation              AM-PAC PT "6 Clicks" Mobility   Outcome Measure  Help needed turning from your back to your side while in a flat bed without using bedrails?: None Help needed moving from lying on your back to sitting on the side of a flat bed without using bedrails?: None Help needed moving to and from a bed to a chair (including a wheelchair)?: A Little Help needed standing up from a chair using your arms (e.g., wheelchair or bedside chair)?: A Little Help needed to walk in hospital room?: A Lot Help needed climbing 3-5 steps with a railing? : A Lot 6 Click Score: 18    End of Session Equipment Utilized During Treatment: Oxygen Activity Tolerance: Patient tolerated treatment well Patient left: in chair;with call bell/phone within reach;with nursing/sitter in room Nurse Communication: Mobility status (O2) PT Visit Diagnosis: Muscle weakness (generalized) (M62.81);Difficulty in walking, not elsewhere classified (R26.2)     Time: 1030-1314 PT Time Calculation (min) (ACUTE ONLY): 33 min  Charges:  $Therapeutic Activity: 23-37 mins                     [name], PT, DPT 01/31/21, 1:07 PM    [name] 01/31/2021, 1:04 PM

## 2021-01-31 NOTE — Progress Notes (Signed)
Patient ID: Edward Atkinson, male   DOB: [birth year], 60 y.o.   MRN: [id number]  [facility] PROGRESS NOTE  XYLON CROOM YQM:[id number] DOB: 1960-08-06 DOA: 01/13/2021 PCP: Rusty Aus, MD  HPI/Subjective: Patient seen this morning.  At that time was on 90% oxygen on heated high flow nasal cannula.  He feels better with regards to his breathing today than yesterday.  He is hopeful to get down off the amount of oxygen that he is on.  Admitted 18 days ago with acute on chronic hypoxic respiratory failure, interstitial lung disease exacerbation.  Objective: Vitals:   01/31/21 1137 01/31/21 1427  BP: 100/65 (!) 98/58  Pulse: 99 86  Resp: (!) 24 20  Temp: 97.8 F (36.6 C) 98.4 F (36.9 C)  SpO2: 98% 96%    Intake/Output Summary (Last 24 hours) at 01/31/2021 1522 Last data filed at 01/31/2021 1018 Gross per 24 hour  Intake 601.79 ml  Output 1350 ml  Net -748.21 ml    Filed Weights   01/29/21 0351 01/30/21 0444 01/31/21 0549  Weight: 72.1 kg 71.8 kg 68.3 kg    ROS: Review of Systems  Respiratory:  Positive for shortness of breath.   Cardiovascular:  Negative for chest pain.  Gastrointestinal:  Negative for abdominal pain, nausea and vomiting.  Exam: Physical Exam HENT:     Head: Normocephalic.     Mouth/Throat:     Pharynx: No oropharyngeal exudate.  Eyes:     General: Lids are normal.     Conjunctiva/sclera: Conjunctivae normal.  Cardiovascular:     Rate and Rhythm: Normal rate and regular rhythm.     Heart sounds: Normal heart sounds, S1 normal and S2 normal.  Pulmonary:     Breath sounds: Examination of the right-lower field reveals decreased breath sounds and rhonchi. Examination of the left-lower field reveals decreased breath sounds and rhonchi. Decreased breath sounds and rhonchi present. No wheezing or rales.  Abdominal:     Palpations: Abdomen is soft.     Tenderness: There is no abdominal tenderness.  Musculoskeletal:     Right ankle: Swelling present.      Left ankle: Swelling present.  Skin:    General: Skin is warm.     Findings: No rash.  Neurological:     Mental Status: He is alert and oriented to person, place, and time.      Scheduled Meds:  acyclovir  400 mg Oral BID   apixaban  5 mg Oral BID   Chlorhexidine Gluconate Cloth  6 each Topical Daily   digoxin  0.125 mg Oral Daily   diltiazem  180 mg Oral Daily   docusate sodium  200 mg Oral BID   feeding supplement (NEPRO CARB STEADY)  237 mL Oral TID BM   furosemide  40 mg Intravenous Daily   guaiFENesin  1,200 mg Oral BID   insulin aspart  0-20 Units Subcutaneous TID WC   insulin aspart  0-5 Units Subcutaneous QHS   insulin aspart  10 Units Subcutaneous TID WC   insulin glargine-yfgn  14 Units Subcutaneous QHS   multivitamin with minerals  1 tablet Oral Daily   mycophenolate  500 mg Oral BID   polyethylene glycol  17 g Oral Daily   predniSONE  50 mg Oral Q breakfast   sodium chloride flush  10-40 mL Intracatheter Q12H   sulfamethoxazole-trimethoprim  1 tablet Oral Q12H   Assessment/Plan:  Acute on chronic hypoxic respiratory failure.  This morning was on heated high  flow nasal cannula 90% oxygen and 50 L flow.  Still desaturates with limited movement.  Hopefully can taper down the amount of oxygen today while at rest.  Case discussed with respiratory.  Interstitial lung disease exacerbation.  Patient on prednisone and CellCept. Leukocytosis.  White blood cell count trended better today from 39.5 down to 18.3.  Empiric antibiotics were started with Zosyn and Zithromax.  Type 2 diabetes mellitus with episodes of hypoglycemia and hyperglycemia.  Patient on Semglee insulin 14 units daily and standing dose short acting insulin plus sliding scale insulin prior to meals. Paroxysmal atrial fibrillation on Eliquis for anticoagulation Cardizem and digoxin for rate control. Follicular lymphoma Prior history of pulmonary embolism Sepsis on presentation     Code Status:      Code Status Orders  (From admission, onward)           Start     Ordered   01/13/21 1831  Full code  Continuous        01/13/21 1831           Code Status History     Date Active Date Inactive Code Status Order ID Comments User Context   07/29/2019 1447 08/01/2019 1954 Full Code [id number]  Ivor Costa, MD ED   01/29/2019 1601 02/01/2019 1733 Full Code [id number]  Nicholes Mango, MD ED      Advance Directive Documentation    Flowsheet Row Most Recent Value  Type of Advance Directive Living will, Healthcare Power of Attorney  Pre-existing out of facility DNR order (yellow form or pink MOST form) --  "MOST" Form in Place? --      Family Communication: Spoke with wife at the bedside Disposition Plan: Status is: Inpatient  Dispo: The patient is from: Home              Anticipated d/c is to: Home              Patient currently with worsening respiratory status as of 01/30/2021.  Trying to taper oxygen.  Currently on heated high flow nasal cannula   Difficult to place patient.  No.  Consultants: Pulmonary  Antibiotics: Zosyn, Zithromax Bactrim  Time spent: 27 minutes  Ja Ohman [employer]

## 2021-02-01 ENCOUNTER — Inpatient Hospital Stay: Payer: BC Managed Care – PPO

## 2021-02-01 DIAGNOSIS — D72829 Elevated white blood cell count, unspecified: Secondary | ICD-10-CM | POA: Diagnosis not present

## 2021-02-01 DIAGNOSIS — J9621 Acute and chronic respiratory failure with hypoxia: Secondary | ICD-10-CM | POA: Diagnosis not present

## 2021-02-01 DIAGNOSIS — E11649 Type 2 diabetes mellitus with hypoglycemia without coma: Secondary | ICD-10-CM | POA: Diagnosis not present

## 2021-02-01 DIAGNOSIS — Z7189 Other specified counseling: Secondary | ICD-10-CM

## 2021-02-01 DIAGNOSIS — J849 Interstitial pulmonary disease, unspecified: Secondary | ICD-10-CM | POA: Diagnosis not present

## 2021-02-01 LAB — GLUCOSE, CAPILLARY
Glucose-Capillary: 131 mg/dL — ABNORMAL HIGH (ref 70–99)
Glucose-Capillary: 80 mg/dL (ref 70–99)
Glucose-Capillary: 140 mg/dL — ABNORMAL HIGH (ref 70–99)
Glucose-Capillary: 111 mg/dL — ABNORMAL HIGH (ref 70–99)

## 2021-02-01 LAB — CBC WITH DIFFERENTIAL/PLATELET
Abs Immature Granulocytes: 0.91 10*3/uL — ABNORMAL HIGH (ref 0.00–0.07)
Basophils Absolute: 0 10*3/uL (ref 0.0–0.1)
Basophils Relative: 0 %
Eosinophils Absolute: 0.1 10*3/uL (ref 0.0–0.5)
Eosinophils Relative: 1 %
HCT: 30.2 % — ABNORMAL LOW (ref 39.0–52.0)
Hemoglobin: 10.2 g/dL — ABNORMAL LOW (ref 13.0–17.0)
Immature Granulocytes: 6 %
Lymphocytes Relative: 1 %
Lymphs Abs: 0.2 10*3/uL — ABNORMAL LOW (ref 0.7–4.0)
MCH: 28.3 pg (ref 26.0–34.0)
MCHC: 33.8 g/dL (ref 30.0–36.0)
MCV: 83.7 fL (ref 80.0–100.0)
Monocytes Absolute: 0.7 10*3/uL (ref 0.1–1.0)
Monocytes Relative: 4 %
Neutro Abs: 13.3 10*3/uL — ABNORMAL HIGH (ref 1.7–7.7)
Neutrophils Relative %: 88 %
Platelets: 179 10*3/uL (ref 150–400)
RBC: 3.61 MIL/uL — ABNORMAL LOW (ref 4.22–5.81)
RDW: 18.2 % — ABNORMAL HIGH (ref 11.5–15.5)
Smear Review: NORMAL
WBC: 15.2 10*3/uL — ABNORMAL HIGH (ref 4.0–10.5)
nRBC: 0 % (ref 0.0–0.2)

## 2021-02-01 LAB — COMPREHENSIVE METABOLIC PANEL
ALT: 28 U/L (ref 0–44)
AST: 19 U/L (ref 15–41)
Albumin: 2.3 g/dL — ABNORMAL LOW (ref 3.5–5.0)
Alkaline Phosphatase: 56 U/L (ref 38–126)
Anion gap: 3 — ABNORMAL LOW (ref 5–15)
BUN: 18 mg/dL (ref 8–23)
CO2: 36 mmol/L — ABNORMAL HIGH (ref 22–32)
Calcium: 8 mg/dL — ABNORMAL LOW (ref 8.9–10.3)
Chloride: 95 mmol/L — ABNORMAL LOW (ref 98–111)
Creatinine, Ser: 0.44 mg/dL — ABNORMAL LOW (ref 0.61–1.24)
GFR, Estimated: >60 mL/min (ref >60)
Glucose, Bld: 127 mg/dL — ABNORMAL HIGH (ref 70–99)
Potassium: 3.8 mmol/L (ref 3.5–5.1)
Sodium: 134 mmol/L — ABNORMAL LOW (ref 135–145)
Total Bilirubin: 0.7 mg/dL (ref 0.3–1.2)
Total Protein: 4.5 g/dL — ABNORMAL LOW (ref 6.5–8.1)

## 2021-02-01 LAB — PROCALCITONIN: Procalcitonin: <0.1 ng/mL

## 2021-02-01 LAB — SEDIMENTATION RATE: Sed Rate: 39 mm/hr — ABNORMAL HIGH (ref 0–20)

## 2021-02-01 LAB — C-REACTIVE PROTEIN: CRP: 6 mg/dL — ABNORMAL HIGH (ref <1.0)

## 2021-02-01 MED ORDER — LACTULOSE 10 GM/15ML PO SOLN: 30.0000 g | Freq: Two times a day (BID) | ORAL | Status: DC | PRN

## 2021-02-01 MED ORDER — LOPERAMIDE HCL 2 MG PO CAPS
4.0000 mg | ORAL_CAPSULE | Freq: Once | ORAL | Status: AC
  Administered 2021-02-01: 4 mg via ORAL
  Filled 2021-02-01: qty 2

## 2021-02-01 MED ORDER — LACTULOSE 10 GM/15ML PO SOLN
30.0000 g | Freq: Three times a day (TID) | ORAL | Status: DC
  Administered 2021-02-01 (×2): 30 g via ORAL
  Filled 2021-02-01 (×2): qty 60

## 2021-02-01 NOTE — Consult Note (Signed)
Consultation Note Date: 02/01/2021   Patient Name: Edward Atkinson  DOB: 27-Jul-1960  MRN: [id number]  Age / Sex: 60 y.o., male  PCP: Edward Aus, MD Referring Physician: Loletha Grayer, MD  Reason for Consultation: Establishing goals of care  HPI/Patient Profile: 60 yo M w/hx of chronic hypoxemia and ILD post COVID19, T2DM, PE on eliquis, Lymphoma s/p Rituxan which is completed.  Came in due to worsening SOB and DOE specifically mild exertional defacation/urination with feelings of presyncope and severe dyspnea which started to get worse post tapering from steroid appx few days prior to onset of symptom progression.  He was placed on solumedrol and reported mild improvement.  He is on 10L /min  during my evaluation.  He had CT chest done with PE protocol and noted to have bilateral GGO infiltrates worse compared to June study  Clinical Assessment and Goals of Care:   Patient is resting in bed with wife at bedside. He is on high flow cannula. They have children. Patient works for a funeral home.  He states prior to this admission, he was unable to work with families at the funeral because of SOB; he began more of an administrative roll. He states he is going to start working from home.   We discussed his diagnosis, prognosis, GOC, EOL wishes disposition and options.  Created space and opportunity for patient  to explore thoughts and feelings regarding current medical information. He states he is optimistic he will be able to go home, though it may take time. He feels he had a set back with his pulmonary status because he tried to do too much, too quickly by getting up to a bedside toilet. He states he feels he is improving. He tells me he has been in discussion with providers and is awaiting follow up at [place] [facility] as he is hopeful they will be able to improve/restore an acceptable status.  Edward Atkinson is working from the hospital at this time, and states he will work remotely.  Discussed limitations of medical interventions to prolong quality of life in some situations and discussed the concept of human mortality. He and his wife tell me they understand his current status, and that anything could happen. He understands his QOL and functional capacity will be different in the future, and he understands  this. He states he and his wife need to speak with their children about his boundaries in care. He does not share what those boundaries are.     Will continue to follow.         SUMMARY OF RECOMMENDATIONS   Will continue to follow.       Primary Diagnoses: Present on Admission:  Acute on chronic respiratory failure with hypoxia (HCC)  SOB (shortness of breath)  Interstitial lung disease (HCC)  Severe sepsis (HCC)  CAP (community acquired pneumonia)  Elevated troponin  Protein calorie malnutrition ([place])   I have reviewed the medical record, interviewed the patient and family, and examined the patient. The following aspects  are pertinent.  Past Medical History:  Diagnosis Date   Chronic respiratory failure with hypoxia (HCC)    2L [place] continuous   COVID-19    Diabetes mellitus without complication (HCC)    Diverticulitis    Follicular lymphoma of intra-abdominal lymph nodes ([place]) 02/15/2019   ILD (interstitial lung disease) ([place])    Social History   Socioeconomic History   Marital status: Married    Spouse name: Not on file   Number of children: Not on file   Years of education: Not on file   Highest education level: Not on file  Occupational History   Not on file  Tobacco Use   Smoking status: Never   Smokeless tobacco: Never  Vaping Use   Vaping Use: Never used  Substance and Sexual Activity   Alcohol use: Yes    Alcohol/week: 0.0 - 2.0 standard drinks    Comment: occasional   Drug use: Never   Sexual activity: Yes  Other Topics Concern   Not on  file  Social History Narrative   Not on file   Social Determinants of Health   Financial Resource Strain: Not on file  Food Insecurity: Not on file  Transportation Needs: Not on file  Physical Activity: Not on file  Stress: Not on file  Social Connections: Not on file   Family History  Problem Relation Age of Onset   Diabetes Brother    Scheduled Meds:  acyclovir  400 mg Oral BID   apixaban  5 mg Oral BID   Chlorhexidine Gluconate Cloth  6 each Topical Daily   digoxin  0.125 mg Oral Daily   diltiazem  180 mg Oral Daily   docusate sodium  200 mg Oral BID   feeding supplement (NEPRO CARB STEADY)  237 mL Oral TID BM   furosemide  40 mg Intravenous Daily   guaiFENesin  1,200 mg Oral BID   insulin aspart  0-20 Units Subcutaneous TID WC   insulin aspart  0-5 Units Subcutaneous QHS   insulin aspart  10 Units Subcutaneous TID WC   insulin glargine-yfgn  14 Units Subcutaneous QHS   lactulose  30 g Oral TID   multivitamin with minerals  1 tablet Oral Daily   mycophenolate  500 mg Oral BID   polyethylene glycol  17 g Oral Daily   predniSONE  50 mg Oral Q breakfast   sodium chloride flush  10-40 mL Intracatheter Q12H   sulfamethoxazole-trimethoprim  1 tablet Oral Q12H   Continuous Infusions: PRN Meds:.acetaminophen **OR** acetaminophen, ALPRAZolam, diltiazem, guaiFENesin-dextromethorphan, levalbuterol, simethicone, sodium chloride, sodium chloride flush, traZODone Medications Prior to Admission:  Prior to Admission medications   Medication Sig Start Date End Date Taking? Authorizing Provider  acyclovir (ZOVIRAX) 400 MG tablet Take 1 tablet (400 mg total) by mouth 2 (two) times daily. 08/27/20  Yes Earlie Server, MD  albuterol (PROVENTIL) (2.5 MG/3ML) 0.083% nebulizer solution Inhale into the lungs. 11/20/20 11/20/21 Yes [provider]  budesonide (PULMICORT) 1 MG/2ML nebulizer solution Inhale into the lungs. 12/18/20 01/17/21 Yes [provider]   dextromethorphan-guaiFENesin (MUCINEX DM) 30-600 MG 12hr tablet Take 1 tablet by mouth 2 (two) times daily as needed for cough.    Yes [provider]  ELIQUIS 2.5 MG TABS tablet TAKE 1 TABLET BY MOUTH TWICE A DAY 01/04/21  Yes Earlie Server, MD  glimepiride (AMARYL) 2 MG tablet Take 2 mg by mouth every morning. 11/30/20  Yes [provider]  metFORMIN (GLUCOPHAGE) 500 MG tablet Take 500  mg by mouth 3 (three) times daily with meals.  10/16/19 10/15/21 Yes [provider]  montelukast (SINGULAIR) 10 MG tablet Take 10 mg by mouth at bedtime.  09/13/19 09/12/21 Yes [provider]  albuterol (VENTOLIN HFA) 108 (90 Base) MCG/ACT inhaler  08/09/19   [provider]  BREZTRI AEROSPHERE 160-9-4.8 MCG/ACT [place] INTO LUNGS TWICE DAILY 12/24/19   [provider]  COMBIVENT RESPIMAT 20-100 MCG/ACT AERS respimat  10/08/20   [provider]  lidocaine-prilocaine (EMLA) cream Apply 1 application topically as needed. 01/07/20   Earlie Server, MD  prochlorperazine (COMPAZINE) 10 MG tablet Take 1 tablet (10 mg total) by mouth every 6 (six) hours as needed (Nausea or vomiting). 02/15/19   Earlie Server, MD   Allergies  Allergen Reactions   Penicillins Rash    Did it involve swelling of the face/tongue/throat, SOB, or low BP? No Did it involve sudden or severe rash/hives, skin peeling, or any reaction on the inside of your mouth or nose? No Did you need to seek medical attention at a hospital or doctor's office? No When did it last happen?      Unknown If all above answers are "NO", may proceed with cephalosporin use.   Review of Systems  Constitutional:  Positive for activity change.  Respiratory:  Positive for shortness of breath.    Physical Exam Pulmonary:     Comments: On high flow cannula.  Neurological:     Mental Status: He is alert.    Vital Signs: BP 108/65 (BP Location: Right Arm)   Pulse (!) 103   Temp 98.1 F (36.7 C) (Oral)   Resp (!) 24    Ht 6' (1.829 m)   Wt 68.9 kg   SpO2 91%   BMI 20.59 kg/m  Pain Scale: 0-10 POSS *See Group Information*: S-Acceptable,Sleep, easy to arouse Pain Score: 0-No pain   SpO2: SpO2: 91 % O2 Device:SpO2: 91 % O2 Flow Rate: .O2 Flow Rate (L/min): 40 L/min  IO: Intake/output summary:  Intake/Output Summary (Last 24 hours) at 02/01/2021 1420 Last data filed at 02/01/2021 1340 Gross per 24 hour  Intake 720 ml  Output 3250 ml  Net -2530 ml    LBM: Last BM Date: 02/01/21 Baseline Weight: Weight: 63.5 kg Most recent weight: Weight: 68.9 kg       Time In: 3:00 Time Out: 3:30 Time Total: 30 min Greater than 50%  of this time was spent counseling and coordinating care related to the above assessment and plan.  Signed by: Asencion Gowda, NP   Please contact Palliative Medicine Team phone at [phone] for questions and concerns.  For individual provider: See Shea Evans

## 2021-02-01 NOTE — Progress Notes (Signed)
Patient ID: DAVIONTE LUSBY, male   DOB: 1960-12-20, 60 y.o.   MRN: [id number]  [facility] PROGRESS NOTE  KYRON SCHLITT TFT:[id number] DOB: 11-08-1960 DOA: 01/13/2021 PCP: Rusty Aus, MD  HPI/Subjective: Patient having some lower abdominal pain.  Still complaining of some constipation.  Down on the amount of oxygen this morning to less than 40% FiO2 on heated high flow nasal cannula.  Initially admitted 19 days ago with interstitial lung disease exacerbation.  Objective: Vitals:   02/01/21 1130 02/01/21 1442  BP: 108/65 104/72  Pulse: (!) 103 93  Resp: (!) 24 20  Temp: 98.1 F (36.7 C) 98.3 F (36.8 C)  SpO2: 91% 91%    Intake/Output Summary (Last 24 hours) at 02/01/2021 1459 Last data filed at 02/01/2021 1340 Gross per 24 hour  Intake 720 ml  Output 3250 ml  Net -2530 ml    Filed Weights   01/30/21 0444 01/31/21 0549 02/01/21 0558  Weight: 71.8 kg 68.3 kg 68.9 kg    ROS: Review of Systems  Respiratory:  Positive for shortness of breath.   Cardiovascular:  Negative for chest pain.  Gastrointestinal:  Positive for constipation. Negative for abdominal pain, nausea and vomiting.  Exam: Physical Exam HENT:     Head: Normocephalic.     Mouth/Throat:     Pharynx: No oropharyngeal exudate.  Eyes:     General: Lids are normal.     Conjunctiva/sclera: Conjunctivae normal.     Pupils: Pupils are equal, round, and reactive to light.  Cardiovascular:     Rate and Rhythm: Normal rate and regular rhythm.     Heart sounds: Normal heart sounds, S1 normal and S2 normal.  Pulmonary:     Breath sounds: Examination of the right-lower field reveals decreased breath sounds and rhonchi. Examination of the left-lower field reveals decreased breath sounds and rhonchi. Decreased breath sounds and rhonchi present. No wheezing or rales.  Abdominal:     Palpations: Abdomen is soft.     Tenderness: There is abdominal tenderness in the suprapubic area.  Musculoskeletal:     Right  lower leg: No swelling.     Left lower leg: No swelling.  Skin:    General: Skin is warm.     Findings: No rash.  Neurological:     Mental Status: He is alert and oriented to person, place, and time.      Scheduled Meds:  acyclovir  400 mg Oral BID   apixaban  5 mg Oral BID   Chlorhexidine Gluconate Cloth  6 each Topical Daily   digoxin  0.125 mg Oral Daily   diltiazem  180 mg Oral Daily   docusate sodium  200 mg Oral BID   feeding supplement (NEPRO CARB STEADY)  237 mL Oral TID BM   furosemide  40 mg Intravenous Daily   guaiFENesin  1,200 mg Oral BID   insulin aspart  0-20 Units Subcutaneous TID WC   insulin aspart  0-5 Units Subcutaneous QHS   insulin aspart  10 Units Subcutaneous TID WC   insulin glargine-yfgn  14 Units Subcutaneous QHS   lactulose  30 g Oral TID   multivitamin with minerals  1 tablet Oral Daily   mycophenolate  500 mg Oral BID   polyethylene glycol  17 g Oral Daily   predniSONE  50 mg Oral Q breakfast   sodium chloride flush  10-40 mL Intracatheter Q12H   sulfamethoxazole-trimethoprim  1 tablet Oral Q12H   Assessment/Plan:  Acute on chronic  hypoxic respiratory failure.  This morning on heated high flow nasal cannula less than 40% FiO2 and on 40 L flow.  Still desaturates with limited movement.  The patient will be with [place] for a while until able to come off high flow nasal cannula.  Then will have to see if he desaturates with movement when on regular nasal cannula. Interstitial lung disease exacerbation.  Patient on prednisone and CellCept. Leukocytosis.  With white count elevated the other day at 39.5 we restarted antibiotics Zosyn and Zithromax.  White blood cell count down to 15.2. Type 2 diabetes mellitus with episodes of hypoglycemia and hyperglycemia.  Continue Semglee insulin 14 units daily and standing the short acting insulin plus sliding scale prior to meals. Lower abdominal pain with constipation we will give lactulose until bowel movement then  will make as needed.  Bladder scan to make sure is not retaining urine. Paroxysmal atrial fibrillation on Eliquis for anticoagulation and Cardizem and digoxin for rate control. Follicular lymphoma Prior history of pulmonary embolism Sepsis from pneumonia on presentation which has resolved.      Code Status:     Code Status Orders  (From admission, onward)           Start     Ordered   01/13/21 1831  Full code  Continuous        01/13/21 1831           Code Status History     Date Active Date Inactive Code Status Order ID Comments User Context   07/29/2019 1447 08/01/2019 1954 Full Code [id number]  Ivor Costa, MD ED   01/29/2019 1601 02/01/2019 1733 Full Code [id number]  Nicholes Mango, MD ED      Advance Directive Documentation    Flowsheet Row Most Recent Value  Type of Advance Directive Living will, Healthcare Power of Attorney  Pre-existing out of facility DNR order (yellow form or pink MOST form) --  "MOST" Form in Place? --      Family Communication: Spoke with wife at the bedside yesterday Disposition Plan: Status is: Inpatient  Dispo: The patient is from: Home              Anticipated d/c is to: Home              Patient currently with worsening respiratory status as of 01/30/2021.  Still on heated high flow nasal cannula   Difficult to place patient.  No.  Consultants: Pulmonary  Antibiotics: Zosyn, Zithromax Bactrim  Time spent: 26 minutes  Aleesa Sweigert [employer]

## 2021-02-01 NOTE — Progress Notes (Signed)
Pulmonary Medicine          Date: 02/01/2021,   MRN# [id number] Edward Atkinson 1960-05-15     AdmissionWeight: 63.5 kg                 CurrentWeight: 68.9 kg   Referring physician: Dr Roderic Palau   CHIEF COMPLAINT:   Acute on chronic hypoxemic respiratory failure   HISTORY OF PRESENT ILLNESS   60 yo M w/hx of chronic hypoxemia and ILD post COVID19, T2DM, PE on eliquis, Lymphoma s/p Rituxan which is completed.  Came in due to worsening SOB and DOE specifically mild exertional defacation/urination with feelings of presyncope and severe dyspnea which started to get worse post tapering from steroid appx few days prior to onset of symptom progression.  He was placed on solumedrol and reported mild improvement.  He is on 10L /min [place] during my evaluation.  He had CT chest done with PE protocol and noted to have bilateral GGO infiltrates worse compared to June study. PCCM consultation for further evaluation and management.     01/17/21-  patient seems to be slighly clinically worse post reduction of IV steroids yesterday indicative of ongoing ILD exacerbation, thus far infectious workup is negative but inflammatory biomarkers are highly elevated also pointing towards ILD exacerbation.  There is new CXR in process this morning for interval changes. We discussed increasing steroids slightly and giving more time.  I will also initiate steroid sparing immunomodulation with BID cellcept as patient will likely need therapy for prolonged time period.  Compensatory tachycardia noted appreciate cardiology team.  Sugars with peaks and valleys with adjusted steroid therapy.  Patient with muscle wasting and protein calorie malnutrition but eating better this am.  01/18/21- patient is on HFNC at 60%, feels better, has not been able to tolerate BIPAP.  Wife is at bedside.  BP is borderline low MAP 76.  He has negative infectious workup and thus far ILD exacerbation seems to be main differential. He  continues to have peaks and valleys in blood glucose.  We started steroid sparing regimen with cellcept bid 250 mg and will increase dose as we taper off prednisone.  Remains tachyarrtythmic but improved. Appreciate everyone involved.     01/19/21- patient seen and examined at bedside.  Wife present during evaluation.  Ow HFNC weaned to 41%/45L/MIN.  CRP is still up, have increased cellcept and reducing solumedrol.  Patient felt stronger physically and was able to get OOB to chair. Cough is less now.  He is non-labored with breathing no accessory mm use today. He had dark stools with reduced hb, FOBT ordred.  Haptoglobin is elevated. Pathology slide review ordered.   01/20/21- patient is essentially unchanged from yesterday from pulmonary perspective. CXR also unchanged,  bloodwork with reduction in CRP.  Plan to wean O2 and continue OOB with PT/OT,  Continue current steroid dose and Cellcept.   01/21/21- patient is stable on HFNC.  He had small set back with increased O2 req after exerting himself in bathroom.  HFNC from 41/45 to 54/45.   He diuresed very well with lasix 20 bid >2L urine in past 24h.  Tachycardia is improved. He does not feel weaker and infact states he feels slowly improved.   01/22/21- I spoke to patient regarding goals of care and recommend DNR.  He is agreeable but I asked him to discuss with wife and family out of respect and we will revisit tommorow.  He states he is feeling better  and CRP is trending down but he remains on HFNC 60/40.  He was able to get OOB today and walked to bathroom but experienced mild desaturation with quick recovery "not much issues".      01/23/21- patient is still on high flow nasal canula , he is on 56%/45L.  We reviewed code status and he is still in discussion with family regarding DNR.   We discussed lung transplant per family request and this may be a future option if he is strong enough and recovers.  I have increased insluin TID to 20 from 15 and  increased bactrim to bid SS   01/24/21- patient weaned on HFNC to 49/45, he was able to use BIPAP overnight thinks its helping.  CXR this AM with interval improvement. Will keep on current dose of meds.  Repeat CRP in am.  Last BM yesterday well formed no blood, eating good, urine output very good on 20 bid lasix. Getting OOB with  transient desaturation.  Overall slight improvement.    01/25/21- patient has weaned down to 35%/40L/min.  He remains full code. Reviewed medical plan with Dr Jimmye Norman today.  Reduced steroids from 50 BID to 40 BID and also reduced insulin slightly.    01/26/21- patient is further weaned to 15L humidified HF nasal canula.  He did PT ROM exercises with mild desaturation. CXR today with expiratory image unable to see much interval changes. Patient reports improvement clinically. Plan to continue current regimen. Sugars slightly high due to steroids, will wean steroids soon. Continue cellcept as current 500 bid  01/27/21- patient is relatively same as yesterday, his O2 is weaned down to 14L.  He remains on IV solumedrol at 50 bid, we did discuss weaning this further to 40 BID. Will keep cellcept at current dose 500 bid.  His ankles have been down in seated position and are edematous on examination. He continues to participate with PT and feels good subjectively. We discussed starting dose of lasix for pedal edema.    01/28/21- patient examined at bedside, s/p PT/OT session with slight dyspnea.  He was weaned to 10L O2 but then went back up to 13 post PT due to increased WOB.  He has complete resolution of LE edema and appears to be euvolemic at this point. We discussed reducing soluemdrol today to 20 bid.  I reviewed plan with Dr Leslye Peer today.  01/29/21- patient examined at bedside.  He had a good day and feels better. We reviewed medical plan and are reducing steroids to PO prednisone starting at 1m daily.   01/30/21-  Patient seen and examined at bedside.  CXR this am with  worsening pulmonary edema, included below.  Vitals stable with mild tachypnea and tachycardia with activity. Will deliver 1 dose IV lasix 20 today. Plan to continue Pred PO and cellcept at 500 bid. Pulmonary intensive PT with IS and will initiate Metaneb therapy with albuterol. Blood work today will be repeated CRP CMP phos procal ESR.  Patient is agreeable to BIPAP QHS and Metaneb BID.   01/31/21-  patient had set back yesterday with mild flash pulm edema on CXR. He diuresed very well overnight and feels back to where he was at previously.  He had transient reactive leukocytosis as well.  We initiated empirically zosyn and zithromax on top of bid bactrim.  This probably could be dcd since he recovered so quickly which would not occur with pneumonia.  Ill go head and dc this today but continue PO bactrim.  Will stay on pred 50 today with cellcept at 500 bid as previous.  His O2 has been reduced from 95 to 70% overnight and hes working with PT today. Hopefully we can wean this back down to 10L as he was previosly then back to his home setting.   02/01/21- patient is somewhat better today, he is being weaned on O2 and is now down to 40%. He had BM today. Leaving prednisone at current dose and cxr today.   PAST MEDICAL HISTORY   Past Medical History:  Diagnosis Date  . Chronic respiratory failure with hypoxia (HCC)    2L [place] continuous  . COVID-19   . Diabetes mellitus without complication ([place])   . Diverticulitis   . Follicular lymphoma of intra-abdominal lymph nodes ([place]) 02/15/2019  . ILD (interstitial lung disease) ([place])      SURGICAL HISTORY   Past Surgical History:  Procedure Laterality Date  . CHOLECYSTECTOMY    . COLON RESECTION     DUE TO DIVERTICULITIS  . FLEXIBLE BRONCHOSCOPY N/A 12/02/2019   Procedure: FLEXIBLE BRONCHOSCOPY;  Surgeon: Ottie Glazier, MD;  Location: [facility];  Service: Thoracic;  Laterality: N/A;  . PORTA CATH INSERTION N/A 03/11/2019   Procedure: PORTA CATH  INSERTION;  Surgeon: Algernon Huxley, MD;  Location: [facility];  Service: Cardiovascular;  Laterality: N/A;  . PULMONARY THROMBECTOMY N/A 01/29/2019   Procedure: PULMONARY THROMBECTOMY;  Surgeon: Algernon Huxley, MD;  Location: [facility];  Service: Cardiovascular;  Laterality: N/A;     FAMILY HISTORY   Family History  Problem Relation Age of Onset  . Diabetes Brother      SOCIAL HISTORY   Social History   Tobacco Use  . Smoking status: Never  . Smokeless tobacco: Never  Vaping Use  . Vaping Use: Never used  Substance Use Topics  . Alcohol use: Yes    Alcohol/week: 0.0 - 2.0 standard drinks    Comment: occasional  . Drug use: Never     MEDICATIONS    Home Medication:    Current Medication:  Current Facility-Administered Medications:  .  acetaminophen (TYLENOL) tablet 650 mg, 650 mg, Oral, Q6H PRN, 650 mg at 01/18/21 1411 **OR** acetaminophen (TYLENOL) suppository 650 mg, 650 mg, Rectal, Q6H PRN, Howerter, Justin B, DO .  acyclovir (ZOVIRAX) 200 MG capsule 400 mg, 400 mg, Oral, BID, Memon, Jehanzeb, MD, 400 mg at 01/31/21 2250 .  ALPRAZolam Duanne Moron) tablet 0.25 mg, 0.25 mg, Oral, BID PRN, Leslye Peer, Richard, MD, 0.25 mg at 01/30/21 1110 .  apixaban (ELIQUIS) tablet 5 mg, 5 mg, Oral, BID, Beers, Shanon Brow, RPH, 5 mg at 01/31/21 2251 .  Chlorhexidine Gluconate Cloth 2 % PADS 6 each, 6 each, Topical, Daily, Kathie Dike, MD, 6 each at 01/31/21 0826 .  digoxin (LANOXIN) tablet 0.125 mg, 0.125 mg, Oral, Daily, Memon, Jolaine Artist, MD, 0.125 mg at 01/31/21 0824 .  diltiazem (CARDIZEM CD) 24 hr capsule 180 mg, 180 mg, Oral, Daily, Paraschos, Alexander, MD, 180 mg at 01/31/21 0825 .  diltiazem (CARDIZEM) injection 10 mg, 10 mg, Intravenous, Q6H PRN, Wyvonnia Dusky, MD, 10 mg at 01/24/21 2328 .  docusate sodium (COLACE) capsule 200 mg, 200 mg, Oral, BID, Wyvonnia Dusky, MD, 200 mg at 01/31/21 2251 .  feeding supplement (NEPRO CARB STEADY) liquid 237 mL, 237  mL, Oral, TID BM, Memon, Jehanzeb, MD, Last Rate: 0 mL/hr at 01/21/21 2130, 237 mL at 01/31/21 2250 .  furosemide (LASIX) injection 40 mg, 40 mg,  Intravenous, Daily, Ottie Glazier, MD, 40 mg at 01/31/21 0826 .  guaiFENesin (MUCINEX) 12 hr tablet 1,200 mg, 1,200 mg, Oral, BID, Memon, Jolaine Artist, MD, 1,200 mg at 01/31/21 2251 .  guaiFENesin-dextromethorphan (ROBITUSSIN DM) 100-10 MG/5ML syrup 5 mL, 5 mL, Oral, Q4H PRN, Kathie Dike, MD, 5 mL at 01/17/21 0648 .  insulin aspart (novoLOG) injection 0-20 Units, 0-20 Units, Subcutaneous, TID WC, Kathie Dike, MD, 7 Units at 01/31/21 1726 .  insulin aspart (novoLOG) injection 0-5 Units, 0-5 Units, Subcutaneous, QHS, Kathie Dike, MD, 4 Units at 01/31/21 2154 .  insulin aspart (novoLOG) injection 10 Units, 10 Units, Subcutaneous, TID WC, Loletha Grayer, MD, 10 Units at 01/31/21 1725 .  insulin glargine-yfgn (SEMGLEE) injection 14 Units, 14 Units, Subcutaneous, QHS, Loletha Grayer, MD, 14 Units at 01/31/21 2153 .  levalbuterol (XOPENEX) nebulizer solution 1.25 mg, 1.25 mg, Nebulization, Q4H PRN, Howerter, Justin B, DO, 1.25 mg at 01/17/21 2011 .  multivitamin with minerals tablet 1 tablet, 1 tablet, Oral, Daily, Kathie Dike, MD, 1 tablet at 01/31/21 0823 .  mycophenolate (CELLCEPT) capsule 500 mg, 500 mg, Oral, BID, Lanney Gins, Calista Crain, MD, 500 mg at 01/31/21 2252 .  polyethylene glycol (MIRALAX / GLYCOLAX) packet 17 g, 17 g, Oral, Daily, Leslye Peer, Richard, MD, 17 g at 01/31/21 0820 .  predniSONE (DELTASONE) tablet 50 mg, 50 mg, Oral, Q breakfast, Jonda Alanis, MD, 50 mg at 01/31/21 0825 .  simethicone (MYLICON) chewable tablet 80 mg, 80 mg, Oral, QID PRN, Loletha Grayer, MD, 80 mg at 01/29/21 1411 .  sodium chloride (OCEAN) 0.65 % nasal spray 1 spray, 1 spray, Each Nare, PRN, Loletha Grayer, MD, 1 spray at 01/30/21 2142 .  sodium chloride flush (NS) 0.9 % injection 10-40 mL, 10-40 mL, Intracatheter, Q12H, Memon, Jolaine Artist, MD, 10 mL at  01/31/21 2252 .  sodium chloride flush (NS) 0.9 % injection 10-40 mL, 10-40 mL, Intracatheter, PRN, Kathie Dike, MD, 10 mL at 01/24/21 0915 .  sulfamethoxazole-trimethoprim (BACTRIM) 400-80 MG per tablet 1 tablet, 1 tablet, Oral, Q12H, Ottie Glazier, MD, 1 tablet at 01/31/21 2252 .  traZODone (DESYREL) tablet 50 mg, 50 mg, Oral, QHS PRN, Sharion Settler, NP, 50 mg at 01/31/21 2252  Facility-Administered Medications Ordered in Other Encounters:  .  heparin lock flush 100 unit/mL, 500 Units, Intravenous, Once, Earlie Server, MD    ALLERGIES   Penicillins     REVIEW OF SYSTEMS    Review of Systems:  Gen:  Denies  fever, sweats, chills weigh loss  HEENT: Denies blurred vision, double vision, ear pain, eye pain, hearing loss, nose bleeds, sore throat Cardiac:  No dizziness, chest pain or heaviness, chest tightness,edema Resp:   Denies cough or sputum porduction, shortness of breath,wheezing, hemoptysis,  Gi: Denies swallowing difficulty, stomach pain, nausea or vomiting, diarrhea, constipation, bowel incontinence Gu:  Denies bladder incontinence, burning urine Ext:   Denies Joint pain, stiffness or swelling Skin: Denies  skin rash, easy bruising or bleeding or hives Endoc:  Denies polyuria, polydipsia , polyphagia or weight change Psych:   Denies depression, insomnia or hallucinations   Other:  All other systems negative   VS: BP 91/64 (BP Location: Right Arm)   Pulse 79   Temp 98.2 F (36.8 C)   Resp 20   Ht 6' (1.829 m)   Wt 68.9 kg   SpO2 100%   BMI 20.59 kg/m      PHYSICAL EXAM    GENERAL:NAD, no fevers, chills, no weakness no fatigue HEAD: Normocephalic, atraumatic.  EYES: Pupils equal,  round, reactive to light. Extraocular muscles intact. No scleral icterus.  MOUTH: Moist mucosal membrane. Dentition intact. No abscess noted.  EAR, NOSE, THROAT: Clear without exudates. No external lesions.  NECK: Supple. No thyromegaly. No nodules. No JVD.  PULMONARY:  Bilateral rhonchi improved from previous CARDIOVASCULAR: S1 and S2. Regular rate and rhythm. No murmurs, rubs, or gallops. No edema. Pedal pulses 2+ bilaterally.  GASTROINTESTINAL: Soft, nontender, nondistended. No masses. Positive bowel sounds. No hepatosplenomegaly.  MUSCULOSKELETAL: No swelling, clubbing, or edema. Range of motion full in all extremities.  NEUROLOGIC: Cranial nerves II through XII are intact. No gross focal neurological deficits. Sensation intact. Reflexes intact.  SKIN: No ulceration, lesions, rashes, or cyanosis. Skin warm and dry. Turgor intact.  PSYCHIATRIC: Mood, affect within normal limits. The patient is awake, alert and oriented x 3. Insight, judgment intact.       IMAGING    CT Angio Chest PE W/Cm &/Or Wo Cm  Result Date: 01/13/2021 CLINICAL DATA:  Respiratory distress, hypoxia EXAM: CT ANGIOGRAPHY CHEST WITH CONTRAST TECHNIQUE: Multidetector CT imaging of the chest was performed using the standard protocol during bolus administration of intravenous contrast. Multiplanar CT image reconstructions and MIPs were obtained to evaluate the vascular anatomy. CONTRAST:  39m OMNIPAQUE IOHEXOL 350 MG/ML SOLN COMPARISON:  10/26/2020, 01/13/2021 FINDINGS: Cardiovascular: This is a technically adequate evaluation of the pulmonary vasculature. No filling defects or pulmonary emboli. The heart is unremarkable without pericardial effusion. No evidence of thoracic aortic aneurysm or dissection. Right chest wall port via internal jugular approach tip within the superior vena cava. Mediastinum/Nodes: No pathologic adenopathy within the mediastinum, hila, or axilla. Thyroid, trachea, and esophagus are unremarkable. Lungs/Pleura: There has been interval progression of the multifocal ground-glass airspace disease seen previously. Changes are most pronounced within the dependent lower lobes. No effusion or pneumothorax. The central airways are patent, with persistent bronchiectasis. Upper  Abdomen: No acute abnormality. Musculoskeletal: No acute or destructive bony lesions. Reconstructed images demonstrate no additional findings. Review of the MIP images confirms the above findings. IMPRESSION: 1. No evidence of pulmonary embolus. 2. Progressive multifocal bilateral ground-glass airspace disease, greatest at the lung bases. The differential diagnosis would include progressive postinflammatory scarring and fibrosis, multifocal atypical pneumonia, or hypersensitivity/drug toxicity. 3. Stable bronchiectasis. Electronically Signed   By: MRanda NgoM.D.   On: 01/13/2021 17:13   DG Chest Port 1 View  Result Date: 01/30/2021 CLINICAL DATA:  Acute on chronic hypoxemic respiratory failure. EXAM: PORTABLE CHEST 1 VIEW COMPARISON:  January 28, 2021 FINDINGS: No pneumothorax. Stable cardiomediastinal silhouette and right Port-A-Cath. Left-greater-than-right pulmonary opacities/infiltrates. No other interval changes. IMPRESSION: 1. Persistent low lung volumes and bilateral pulmonary infiltrates. No interval changes. Electronically Signed   By: DDorise BullionIII M.D.   On: 01/30/2021 08:36   DG Chest Port 1 View  Result Date: 01/28/2021 CLINICAL DATA:  Shortness of breath. EXAM: PORTABLE CHEST 1 VIEW COMPARISON:  Multiple recent chest films. FINDINGS: The right IJ power port is stable. Persistent low lung volumes and bilateral pulmonary infiltrates. No definite pleural effusion or pneumothorax. IMPRESSION: Persistent low lung volumes and bilateral pulmonary infiltrates. Electronically Signed   By: PMarijo SanesM.D.   On: 01/28/2021 16:03   DG Chest Port 1 View  Result Date: 01/26/2021 CLINICAL DATA:  Shortness of breath, abnormal chest x-ray EXAM: PORTABLE CHEST 1 VIEW COMPARISON:  01/24/2021 FINDINGS: Right Port-A-Cath remains in place, unchanged. Low lung volumes with bilateral airspace opacities, most pronounced in the left perihilar and lower lobe, unchanged since prior  study. No visible  effusions or pneumothorax. Heart is normal size. IMPRESSION: Low lung volumes with bilateral airspace disease, left-greater-than-right. No significant change. Electronically Signed   By: Rolm Baptise M.D.   On: 01/26/2021 09:42   DG Chest Port 1 View  Result Date: 01/24/2021 CLINICAL DATA:  Follow-up multifocal pneumonia. EXAM: PORTABLE CHEST 1 VIEW COMPARISON:  01/22/2021 and older exams. FINDINGS: Mild interval decrease in the airspace lung opacities, most evident on the left. Lung volumes remain low. No new lung abnormalities. No visualized pleural effusion.  No pneumothorax. Right internal jugular Port-A-Cath is stable. IMPRESSION: 1. Mild interval improvement in lung aeration with decreased airspace lung opacities, most notably on the left. Electronically Signed   By: Lajean Manes M.D.   On: 01/24/2021 07:57   DG Chest Port 1 View  Result Date: 01/22/2021 CLINICAL DATA:  Hypoxia, shortness of breath. EXAM: PORTABLE CHEST 1 VIEW COMPARISON:  January 20, 2021. FINDINGS: Stable cardiomediastinal silhouette. Right internal jugular Port-A-Cath is unchanged in position. Hypoinflation of the lungs is noted. Stable bilateral lung opacities are noted, left greater than right, most consistent with multifocal pneumonia. Bony thorax is unremarkable. IMPRESSION: Hypoinflation of the lungs. Stable bilateral lung opacities are noted, left greater than right, most consistent with multifocal pneumonia. Electronically Signed   By: Marijo Conception M.D.   On: 01/22/2021 19:26   DG Chest Port 1 View  Result Date: 01/20/2021 CLINICAL DATA:  Pneumonitis EXAM: PORTABLE CHEST 1 VIEW COMPARISON:  01/19/2021 FINDINGS: Shallow inspiration. Patchy infiltrates in the left lung and right lung base, similar to prior study and likely multifocal pneumonia. No pleural effusions. No pneumothorax. Mediastinal contours appear intact. Heart size is normal. Power port type central venous catheter on the right with tip over the  cavoatrial junction region. Surgical clips in the right upper quadrant. IMPRESSION: Shallow inspiration. Patchy infiltrates in both lungs, likely multifocal pneumonia, without change. Electronically Signed   By: Lucienne Capers M.D.   On: 01/20/2021 03:56   DG Chest Port 1 View  Result Date: 01/19/2021 CLINICAL DATA:  Abnormality on previous chest x-ray in a 60 year old male. EXAM: PORTABLE CHEST 1 VIEW COMPARISON:  CT from January 13, 2021, chest x-ray January 18, 2021. FINDINGS: RIGHT-sided Port-A-Cath terminates at the caval to atrial junction. Trachea is midline. Cardiomediastinal contours are stable. Interstitial and airspace opacities throughout the chest worse in the LEFT mid to upper chest, RIGHT upper chest lung bases bilaterally with similar appearance. On limited assessment there is no acute skeletal process. EKG leads project over the chest. IMPRESSION: No interval change in the appearance of the chest since previous imaging with continued decreased lung volumes and patchy bilateral opacities. Electronically Signed   By: Zetta Bills M.D.   On: 01/19/2021 07:59   DG Chest Port 1 View  Result Date: 01/18/2021 CLINICAL DATA:  Respiratory distress EXAM: PORTABLE CHEST 1 VIEW COMPARISON:  01/17/2021 FINDINGS: Cardiac shadow is stable. Right-sided chest wall port is again seen. Lungs are hypoinflated. Patchy airspace opacities again identified in both lungs left greater than right but stable from the prior exam. IMPRESSION: Patchy airspace opacities bilaterally stable from the prior exam. Electronically Signed   By: Inez Catalina M.D.   On: 01/18/2021 17:24   DG Chest Port 1 View  Result Date: 01/17/2021 CLINICAL DATA:  Worsening shortness of breath. EXAM: PORTABLE CHEST 1 VIEW COMPARISON:  01/14/2021 FINDINGS: 0826 hours. Low volume film. Patchy airspace disease again noted in both lungs, left greater than right. Cardiopericardial silhouette is  at upper limits of normal for size. Right  Port-A-Cath again noted. Telemetry leads overlie the chest. IMPRESSION: Low volume film with patchy bilateral airspace disease, left greater than right. No substantial change. Electronically Signed   By: Misty Stanley M.D.   On: 01/17/2021 08:53   DG Chest Port 1 View  Result Date: 01/14/2021 CLINICAL DATA:  Shortness of breath.  History of cancer.  Diabetes. EXAM: PORTABLE CHEST 1 VIEW.  Patient is rotated. COMPARISON:  Chest x-ray 01/13/2021, CT chest 01/13/2021 FINDINGS: Accessed right chest wall Port-A-Cath in stable position. The heart and mediastinal contours are within normal limits. Low lung volumes with persistent diffuse patchy airspace opacities. No pleural effusion. No pneumothorax. No acute osseous abnormality. Right upper quadrant surgical clips. IMPRESSION: Low lung volumes with persistent diffuse patchy airspace opacities. Electronically Signed   By: Iven Finn M.D.   On: 01/14/2021 21:08   DG Chest Port 1 View  Result Date: 01/13/2021 CLINICAL DATA:  Shortness of breath EXAM: PORTABLE CHEST 1 VIEW COMPARISON:  Chest radiograph 07/29/2019 FINDINGS: There is a right chest wall port with the tip terminating in the lower SVC/cavoatrial junction. The cardiomediastinal silhouette is within normal limits. Lung volumes are low with unchanged asymmetric elevation of the right hemidiaphragm. There are increased interstitial markings with diffuse reticular opacities throughout both lungs. There is no focal consolidation. There is no significant pleural effusion. There is no pneumothorax. There is no acute osseous abnormality. There is gaseous distention of the stomach and large bowel in the right upper quadrant. IMPRESSION: Low lung volumes with suspected mild pulmonary interstitial edema. Atypical/viral infection could have a similar appearance. Electronically Signed   By: Valetta Mole M.D.   On: 01/13/2021 14:24           ASSESSMENT/PLAN   Acute hypoxemic respiratory failure Due to  acute exacerbation of interstitial lung disease  - present on admission  - COVID19 negative   - supplemental O2 during my evaluation 10l/min>>8L>>HFNC  -Respiratory viral panel-negative  -serum fungitell-negative  -legionella ab-negative  -nasal MRSA PCR-negative -Procalcitonin trend reviewed -strep pneumoniae ur AG -Histoplasma Ur Ag-negative -sputum resp cultures -AFB sputum expectorated specimen -sputum cytology  -completed  Cefepime course for cap -Reduced Solumedrol to 40 BID - 01/15/21>>50 BID>>40 BID>>30 BID>>20 BID>>pred 50 -reviewed pertinent imaging with patient today - ESR and CRP are elevated but improving -PT/OT for d/c planning  -please encourage patient to use incentive spirometer few times each hour while hospitalized.   -continue Cellcept 250 bid >>500 bid  -dcd lasix 01/28/21 -UOP is adequate -Discussed goal of care - recommend DNR patient is in agreement but still discussing with wife.  -lasix 40 x 1 today - 01/30/21  Acute blood loss anemia-resolved    - noted Hb trending down from 14 to 10 -    -fecal occult blood test    - may be due to GI bleed due to eliquis - monitor for grossly visible bleeding and stable vital sings    Thank you for allowing me to participate in the care of this patient.   Patient/Family are satisfied with care plan and all questions have been answered.   This document was prepared using Dragon voice recognition software and may include unintentional dictation errors.     Ottie Glazier, M.D.  Division of [place]

## 2021-02-01 NOTE — TOC Progression Note (Signed)
Transition of Care [facility]) - Progression Note    Patient Details  Name: Edward Atkinson MRN: [id number] Date of Birth: 05-May-1960  Transition of Care [facility]) CM/SW [place], RN Phone Number: 02/01/2021, 2:31 PM  Clinical Narrative:  NMS possible discharge in 1-2wk per Attending in progression rounds.     Expected Discharge Plan: Home/Self Care Barriers to Discharge: Continued Medical Work up  Expected Discharge Plan and Services Expected Discharge Plan: Home/Self Care     Post Acute Care Choice: NA Living arrangements for the past 2 months: Single Family Home                                       Social Determinants of Health (SDOH) Interventions    Readmission Risk Interventions Readmission Risk Prevention Plan 01/17/2021  Transportation Screening Complete  PCP or Specialist Appt within 3-5 Days Complete  Social Work Consult for Gravity Planning/Counseling [place] Not Applicable  Medication Review [employer]) Complete  Some recent data might be hidden

## 2021-02-02 DIAGNOSIS — J849 Interstitial pulmonary disease, unspecified: Secondary | ICD-10-CM | POA: Diagnosis not present

## 2021-02-02 DIAGNOSIS — L89152 Pressure ulcer of sacral region, stage 2: Secondary | ICD-10-CM

## 2021-02-02 DIAGNOSIS — J9621 Acute and chronic respiratory failure with hypoxia: Secondary | ICD-10-CM | POA: Diagnosis not present

## 2021-02-02 DIAGNOSIS — L8990 Pressure ulcer of unspecified site, unspecified stage: Secondary | ICD-10-CM | POA: Insufficient documentation

## 2021-02-02 DIAGNOSIS — E11649 Type 2 diabetes mellitus with hypoglycemia without coma: Secondary | ICD-10-CM | POA: Diagnosis not present

## 2021-02-02 DIAGNOSIS — Z7189 Other specified counseling: Secondary | ICD-10-CM | POA: Diagnosis not present

## 2021-02-02 DIAGNOSIS — D72829 Elevated white blood cell count, unspecified: Secondary | ICD-10-CM | POA: Diagnosis not present

## 2021-02-02 LAB — GLUCOSE, CAPILLARY
Glucose-Capillary: 130 mg/dL — ABNORMAL HIGH (ref 70–99)
Glucose-Capillary: 61 mg/dL — ABNORMAL LOW (ref 70–99)
Glucose-Capillary: 128 mg/dL — ABNORMAL HIGH (ref 70–99)
Glucose-Capillary: 353 mg/dL — ABNORMAL HIGH (ref 70–99)
Glucose-Capillary: 198 mg/dL — ABNORMAL HIGH (ref 70–99)

## 2021-02-02 MED ORDER — FUROSEMIDE 10 MG/ML IJ SOLN
20.0000 mg | Freq: Every day | INTRAMUSCULAR | Status: DC
  Administered 2021-02-03 – 2021-02-13 (×11): 20 mg via INTRAVENOUS
  Filled 2021-02-02 (×11): qty 2

## 2021-02-02 MED ORDER — LOPERAMIDE HCL 2 MG PO CAPS
2.0000 mg | ORAL_CAPSULE | ORAL | Status: AC | PRN
Start: 2021-02-02 — End: 2021-02-02
  Administered 2021-02-02 (×2): 2 mg via ORAL
  Filled 2021-02-02 (×2): qty 1

## 2021-02-02 MED ORDER — INSULIN GLARGINE-YFGN 100 UNIT/ML ~~LOC~~ SOLN
9.0000 [IU] | Freq: Every day | SUBCUTANEOUS | Status: DC
  Administered 2021-02-02 – 2021-02-06 (×5): 9 [IU] via SUBCUTANEOUS
  Filled 2021-02-02 (×6): qty 0.09

## 2021-02-02 NOTE — Progress Notes (Signed)
Nutrition Follow Up Note   DOCUMENTATION CODES:   Severe malnutrition in context of chronic illness  INTERVENTION:   Nepro Shake po TID, each supplement provides 425 kcal and 19 grams protein  MVI po daily   NUTRITION DIAGNOSIS:   Severe Malnutrition related to chronic illness (lymphoma, ILD) as evidenced by severe fat depletion, severe muscle depletion, 16 percent weight loss in <3 months.  GOAL:   Patient will meet greater than or equal to 90% of their needs -progressing   MONITOR:   PO intake, Supplement acceptance, Labs, Weight trends, Skin, I & O's  ASSESSMENT:   60 y/o male with h/o ILD, COVID19, T2DM, PE on eliquis and lymphoma s/p Rituxan which is completed who is admitted with PNA and sepsis.  Pt with good appetite and oral intake for most of his admission. Pt eating 100% of most meals over the past couple of weeks. Pt's oral intake decreased over the past two days r/t abdominal pain secondary to constipation. Pt having BMs today after regimen. RD will monitor pt's oral intake. Pt drinking most of his Nepro supplements up until today. Per chart, pt has remained weight stable since admission. Palliative care following.   Medications reviewed and include: colace, lasix, insulin, MVI, cellcept, miralax, prednisone, bactrim  Labs reviewed: Na 134(L), K 3.8 wnl, creat 0.44(L) Wbc- 15.2(H), Hgb 10.2(L), Hct 30.2(L) Cbgs- 130, 61, 128, 353 x 24 hrs  Diet Order:    Diet Order             Diet Carb Modified Fluid consistency: Thin; Room service appropriate? Yes  Diet effective now                  EDUCATION NEEDS:   Education needs have been addressed  Skin:  Skin Assessment: Reviewed RN Assessment  Last BM:  10/4- type 7  Height:   Ht Readings from Last 1 Encounters:  01/14/21 6' (1.829 m)    Weight:   Wt Readings from Last 1 Encounters:  02/02/21 68.4 kg    Ideal Body Weight:  80.9 kg  BMI:  Body mass index is 20.47 kg/m.  Estimated  Nutritional Needs:   Kcal:  2200-2500kcal/day  Protein:  110-125g/day  Fluid:  2.0-2.3L/day  Koleen Distance MS, RD, LDN Please refer to [facility] for RD and/or RD on-call/weekend/after hours pager

## 2021-02-02 NOTE — Progress Notes (Signed)
Patient ID: Edward Atkinson, male   DOB: 1961-03-06, 60 y.o.   MRN: [id number] [facility] PROGRESS NOTE  Edward Atkinson OAC:[id number] DOB: 1961/02/06 DOA: 01/13/2021 PCP: Rusty Aus, MD  HPI/Subjective: Patient this morning was on heated high flow nasal cannula 39% FiO2 and 40 L flow.  Patient was switched over to bubble nasal cannula 15 L today.  Patient still taking shallow breaths.  Patient admitted 20 days ago with interstitial lung disease exacerbation  Objective: Vitals:   02/02/21 1135 02/02/21 1524  BP:  106/67  Pulse:  84  Resp:  17  Temp:  98.7 F (37.1 C)  SpO2: 97% 96%    Intake/Output Summary (Last 24 hours) at 02/02/2021 1750 Last data filed at 02/02/2021 1600 Gross per 24 hour  Intake 960 ml  Output 1300 ml  Net -340 ml   Filed Weights   01/31/21 0549 02/01/21 0558 02/02/21 0500  Weight: 68.3 kg 68.9 kg 68.4 kg    ROS: Review of Systems  Respiratory:  Positive for shortness of breath.   Cardiovascular:  Negative for chest pain.  Gastrointestinal:  Negative for abdominal pain, nausea and vomiting.  Exam: Physical Exam HENT:     Head: Normocephalic.     Mouth/Throat:     Pharynx: No oropharyngeal exudate.  Eyes:     General: Lids are normal.     Conjunctiva/sclera: Conjunctivae normal.     Pupils: Pupils are equal, round, and reactive to light.  Cardiovascular:     Rate and Rhythm: Normal rate and regular rhythm.     Heart sounds: Normal heart sounds, S1 normal and S2 normal.  Pulmonary:     Effort: Accessory muscle usage present.     Breath sounds: Examination of the right-middle field reveals decreased breath sounds and rhonchi. Examination of the left-middle field reveals decreased breath sounds and rhonchi. Examination of the right-lower field reveals decreased breath sounds and rhonchi. Examination of the left-lower field reveals decreased breath sounds and rhonchi. Decreased breath sounds and rhonchi present. No wheezing or rales.   Abdominal:     Palpations: Abdomen is soft.     Tenderness: There is no abdominal tenderness.  Musculoskeletal:     Right lower leg: No swelling.     Left lower leg: No swelling.  Skin:    General: Skin is warm.     Findings: No rash.  Neurological:     Mental Status: He is alert and oriented to person, place, and time.      Scheduled Meds:  apixaban  5 mg Oral BID   Chlorhexidine Gluconate Cloth  6 each Topical Daily   digoxin  0.125 mg Oral Daily   diltiazem  180 mg Oral Daily   docusate sodium  200 mg Oral BID   feeding supplement (NEPRO CARB STEADY)  237 mL Oral TID BM   [START ON 02/03/2021] furosemide  20 mg Intravenous Daily   guaiFENesin  1,200 mg Oral BID   insulin aspart  0-20 Units Subcutaneous TID WC   insulin aspart  0-5 Units Subcutaneous QHS   insulin aspart  10 Units Subcutaneous TID WC   insulin glargine-yfgn  9 Units Subcutaneous QHS   multivitamin with minerals  1 tablet Oral Daily   mycophenolate  500 mg Oral BID   polyethylene glycol  17 g Oral Daily   predniSONE  50 mg Oral Q breakfast   sodium chloride flush  10-40 mL Intracatheter Q12H   sulfamethoxazole-trimethoprim  1 tablet Oral Q12H  Brief history 60 year old man with history of follicular lymphoma, FIEPP-29 infection, interstitial lung disease and chronic respiratory failure, and type 2 diabetes mellitus.  Patient was admitted 20 days ago with acute hypoxic respiratory failure and interstitial lung disease exacerbation.  Patient had a CT scan of the chest that did not show any pulmonary embolism.  The patient was on heated high flow nasal cannula during the course and then was tapered over to bubble high flow nasal cannula.  He was able to come down to as low as 9 L on that but then a couple days ago had to go back on heated high flow nasal cannula 100% oxygen and when he got up to go to the commode.  The last few days he has been on heated high flow nasal cannula tapering down the oxygen.  It has  taken a few days to come back to bubble high flow nasal cannula 15 L.  With recent white count elevation and worsening respiratory status we started Zosyn and Zithromax.  Assessment/Plan:  Acute on chronic hypoxic respiratory failure.  This morning he was on heated high flow nasal cannula 39% FiO2 on 40 L flow.  He was able to taper over to 15 L bubble high flow nasal cannula today.  He still desaturates with limited movement.  Continue working closely with physical therapy. Interstitial lung disease exacerbation.  Patient on prednisone 50 mg daily and CellCept as per pulmonary.  Prophylactically on Bactrim and acyclovir. Leukocytosis on 01/30/2021 and his white count went up to 39.5 with the episode of desaturation.  We started Zosyn and Zithromax at that time.  White count has come down to 15.2.  Likely can do a 5-day course of antibiotic. Type 2 diabetes mellitus with episodes of hypoglycemia and hyperglycemia.  We will cut back Semglee insulin to 9 units at night and short acting insulin plus sliding scale prior to meals. Lower abdominal pain with constipation resolved with lactulose. Paroxysmal atrial fibrillation on Eliquis for anticoagulation and Cardizem and digoxin for rate control. Follicular lymphoma Sepsis on presentation which has resolved Stage II decubitus ulcer documented on 02/02/2021.  See description below  Pressure Injury 02/02/21 Sacrum Medial Stage 2 -  Partial thickness loss of dermis presenting as a shallow open injury with a red, pink wound bed without slough. (Active)  02/02/21 1652  Location: Sacrum  Location Orientation: Medial  Staging: Stage 2 -  Partial thickness loss of dermis presenting as a shallow open injury with a red, pink wound bed without slough.  Wound Description (Comments):   Present on Admission:        Code Status:     Code Status Orders  (From admission, onward)           Start     Ordered   01/13/21 1831  Full code  Continuous         01/13/21 1831           Code Status History     Date Active Date Inactive Code Status Order ID Comments User Context   07/29/2019 1447 08/01/2019 1954 Full Code [id number]  Ivor Costa, MD ED   01/29/2019 1601 02/01/2019 1733 Full Code [id number]  Nicholes Mango, MD ED      Advance Directive Documentation    Flowsheet Row Most Recent Value  Type of Advance Directive Living will, Healthcare Power of Attorney  Pre-existing out of facility DNR order (yellow form or pink MOST form) --  "MOST" Form in Place? --  Disposition Plan: Status is: Inpatient  Dispo: The patient is from: Home              Anticipated d/c is to: Home with home health              Patient currently switched from heated high flow nasal cannula bubble high flow nasal cannula 15 L today   Difficult to place patient.  No.  Consultants: Pulmonary  Antibiotics: Zosyn and Zithromax started 01/30/2021 Empiric acyclovir Bactrim as per pulmonary  Time spent: 27 minutes  Terren Jandreau [employer]

## 2021-02-02 NOTE — Progress Notes (Signed)
Patient out of bed to chair. Tolerated well on his high flow.

## 2021-02-02 NOTE — Progress Notes (Signed)
Daily Progress Note   Patient Name: Edward Atkinson       Date: 02/02/2021 DOB: [birth year]  Age: 60 y.o. MRN#: [id number] Attending Physician: Loletha Grayer, MD Primary Care Physician: Rusty Aus, MD Admit Date: 01/13/2021  Reason for Consultation/Follow-up: Establishing goals of care  Subjective: Patient is sitting in bedside chair, no family at bedside. He is happy to be on a simple [place] from high flow. He states he is hopeful to continue to improve. He is eager to follow up with the ILD clinic at [facility] main campus.   He states in regards to [place], he wanted to be a DNR, but his wife is not okay with this decision. He would not want to be placed on a ventilator if it would be most likely he would not be able to "come off if it". Discussed this in depth, and his lung status. He states his wife would want him placed on th ventilator. He tells me he wants to do whatever she and his children wants to do as he does not want her to feel guilt. Discussed him talking about limits with his family.     Length of Stay: 20  Current Medications: Scheduled Meds:  . apixaban  5 mg Oral BID  . Chlorhexidine Gluconate Cloth  6 each Topical Daily  . digoxin  0.125 mg Oral Daily  . diltiazem  180 mg Oral Daily  . docusate sodium  200 mg Oral BID  . feeding supplement (NEPRO CARB STEADY)  237 mL Oral TID BM  . [START ON 02/03/2021] furosemide  20 mg Intravenous Daily  . guaiFENesin  1,200 mg Oral BID  . insulin aspart  0-20 Units Subcutaneous TID WC  . insulin aspart  0-5 Units Subcutaneous QHS  . insulin aspart  10 Units Subcutaneous TID WC  . insulin glargine-yfgn  9 Units Subcutaneous QHS  . multivitamin with minerals  1 tablet Oral Daily  . mycophenolate  500 mg Oral BID  . polyethylene glycol  17 g  Oral Daily  . predniSONE  50 mg Oral Q breakfast  . sodium chloride flush  10-40 mL Intracatheter Q12H  . sulfamethoxazole-trimethoprim  1 tablet Oral Q12H    Continuous Infusions:   PRN Meds: acetaminophen **OR** acetaminophen, ALPRAZolam, diltiazem, guaiFENesin-dextromethorphan, lactulose, levalbuterol, simethicone, sodium chloride, sodium chloride  flush, traZODone  Physical Exam Pulmonary:     Comments: Bre[place] in conversation Neurological:     Mental Status: He is alert.            Vital Signs: BP 106/67 (BP Location: Right Arm)   Pulse 84   Temp 98.7 F (37.1 C)   Resp 17   Ht 6' (1.829 m)   Wt 68.4 kg   SpO2 96%   BMI 20.47 kg/m  SpO2: SpO2: 96 % O2 Device: O2 Device: Nasal Cannula O2 Flow Rate: O2 Flow Rate (L/min): 15 L/min  Intake/output summary:  Intake/Output Summary (Last 24 hours) at 02/02/2021 1603 Last data filed at 02/02/2021 1431 Gross per 24 hour  Intake 960 ml  Output 700 ml  Net 260 ml   LBM: Last BM Date: 02/01/21 Baseline Weight: Weight: 63.5 kg Most recent weight: Weight: 68.4 kg     Patient Active Problem List   Diagnosis Date Noted  . Leukocytosis   . AF (paroxysmal atrial fibrillation) ([place])   . Thrombocytosis   . Protein-calorie malnutrition, severe 01/15/2021  . Severe sepsis ([place]) 01/14/2021  . CAP (community acquired pneumonia) 01/14/2021  . Elevated troponin 01/14/2021  . Protein calorie malnutrition ([place]) 01/14/2021  . Acute on chronic respiratory failure with hypoxia ([place]) 01/13/2021  . Encounter for monoclonal antibody treatment for malignancy 07/02/2020  . Interstitial lung disease ([place]) 07/02/2020  . Lymphocytopenia   . SOB (shortness of breath)   . Sepsis ([place]) 07/29/2019  . Thrombocytopenia ([place]) 07/29/2019  . History of DVT (deep vein thrombosis) 07/06/2019  . Transaminitis 07/06/2019  . History of pulmonary embolism 07/06/2019  . Encounter for antineoplastic chemotherapy 04/29/2019  . Pulmonary embolus ([place])  04/29/2019  . Follicular lymphoma ([place]) 02/15/2019  . Goals of care, counseling/discussion 02/15/2019  . DVT (deep venous thrombosis) ([place]) 02/12/2019  . Prostatitis 02/07/2019  . 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[place]) 02/06/2019  . Other pulmonary embolism with acute cor pulmonale ([place]) 02/06/2019  . Type 2 diabetes mellitus with hypoglycemia without coma, without long-term current use of insulin ([place]) 02/06/2019  . Massive pulmonary hemorrhage originating in perinatal period 01/29/2019    Palliative Care Assessment & Plan     Recommendations/Plan: Full code/ full scope Recommend outpatient palliative    Code Status:    Code Status Orders  (From admission, onward)           Start     Ordered   01/13/21 1831  Full code  Continuous        01/13/21 1831           Code Status History     Date Active Date Inactive Code Status Order ID Comments User Context   07/29/2019 1447 08/01/2019 1954 Full Code [id number]  Ivor Costa, MD ED   01/29/2019 1601 02/01/2019 1733 Full Code [id number]  Nicholes Mango, MD ED      Advance Directive Documentation    Flowsheet Row Most Recent Value  Type of Advance Directive Living will, Healthcare Power of Attorney  Pre-existing out of facility DNR order (yellow form or pink MOST form) --  "MOST" Form in Place? --       Prognosis:  Unable to determine    Thank you for allowing the Palliative Medicine Team to assist in the care of this patient.       Total Time 25 min Prolonged Time Billed  no       Greater than 50%  of this time was spent counseling and  coordinating care related to the above assessment and plan.  Asencion Gowda, NP  Please contact Palliative Medicine Team phone at [phone] for questions and concerns.

## 2021-02-02 NOTE — Progress Notes (Signed)
Pulmonary Medicine          Date: 02/02/2021,   MRN# [id number] Edward Atkinson 1960-07-18     AdmissionWeight: 63.5 kg                 CurrentWeight: 68.4 kg   Referring physician: Dr Roderic Palau   CHIEF COMPLAINT:   Acute on chronic hypoxemic respiratory failure   HISTORY OF PRESENT ILLNESS   60 yo M w/hx of chronic hypoxemia and ILD post COVID19, T2DM, PE on eliquis, Lymphoma s/p Rituxan which is completed.  Came in due to worsening SOB and DOE specifically mild exertional defacation/urination with feelings of presyncope and severe dyspnea which started to get worse post tapering from steroid appx few days prior to onset of symptom progression.  He was placed on solumedrol and reported mild improvement.  He is on 10L /min [place] during my evaluation.  He had CT chest done with PE protocol and noted to have bilateral GGO infiltrates worse compared to June study. PCCM consultation for further evaluation and management.     01/17/21-  patient seems to be slighly clinically worse post reduction of IV steroids yesterday indicative of ongoing ILD exacerbation, thus far infectious workup is negative but inflammatory biomarkers are highly elevated also pointing towards ILD exacerbation.  There is new CXR in process this morning for interval changes. We discussed increasing steroids slightly and giving more time.  I will also initiate steroid sparing immunomodulation with BID cellcept as patient will likely need therapy for prolonged time period.  Compensatory tachycardia noted appreciate cardiology team.  Sugars with peaks and valleys with adjusted steroid therapy.  Patient with muscle wasting and protein calorie malnutrition but eating better this am.  01/18/21- patient is on HFNC at 60%, feels better, has not been able to tolerate BIPAP.  Wife is at bedside.  BP is borderline low MAP 76.  He has negative infectious workup and thus far ILD exacerbation seems to be main differential. He  continues to have peaks and valleys in blood glucose.  We started steroid sparing regimen with cellcept bid 250 mg and will increase dose as we taper off prednisone.  Remains tachyarrtythmic but improved. Appreciate everyone involved.     01/19/21- patient seen and examined at bedside.  Wife present during evaluation.  Ow HFNC weaned to 41%/45L/MIN.  CRP is still up, have increased cellcept and reducing solumedrol.  Patient felt stronger physically and was able to get OOB to chair. Cough is less now.  He is non-labored with breathing no accessory mm use today. He had dark stools with reduced hb, FOBT ordred.  Haptoglobin is elevated. Pathology slide review ordered.   01/20/21- patient is essentially unchanged from yesterday from pulmonary perspective. CXR also unchanged,  bloodwork with reduction in CRP.  Plan to wean O2 and continue OOB with PT/OT,  Continue current steroid dose and Cellcept.   01/21/21- patient is stable on HFNC.  He had small set back with increased O2 req after exerting himself in bathroom.  HFNC from 41/45 to 54/45.   He diuresed very well with lasix 20 bid >2L urine in past 24h.  Tachycardia is improved. He does not feel weaker and infact states he feels slowly improved.   01/22/21- I spoke to patient regarding goals of care and recommend DNR.  He is agreeable but I asked him to discuss with wife and family out of respect and we will revisit tommorow.  He states he is feeling better  and CRP is trending down but he remains on HFNC 60/40.  He was able to get OOB today and walked to bathroom but experienced mild desaturation with quick recovery "not much issues".      01/23/21- patient is still on high flow nasal canula , he is on 56%/45L.  We reviewed code status and he is still in discussion with family regarding DNR.   We discussed lung transplant per family request and this may be a future option if he is strong enough and recovers.  I have increased insluin TID to 20 from 15 and  increased bactrim to bid SS   01/24/21- patient weaned on HFNC to 49/45, he was able to use BIPAP overnight thinks its helping.  CXR this AM with interval improvement. Will keep on current dose of meds.  Repeat CRP in am.  Last BM yesterday well formed no blood, eating good, urine output very good on 20 bid lasix. Getting OOB with  transient desaturation.  Overall slight improvement.    01/25/21- patient has weaned down to 35%/40L/min.  He remains full code. Reviewed medical plan with Dr Jimmye Norman today.  Reduced steroids from 50 BID to 40 BID and also reduced insulin slightly.    01/26/21- patient is further weaned to 15L humidified HF nasal canula.  He did PT ROM exercises with mild desaturation. CXR today with expiratory image unable to see much interval changes. Patient reports improvement clinically. Plan to continue current regimen. Sugars slightly high due to steroids, will wean steroids soon. Continue cellcept as current 500 bid  01/27/21- patient is relatively same as yesterday, his O2 is weaned down to 14L.  He remains on IV solumedrol at 50 bid, we did discuss weaning this further to 40 BID. Will keep cellcept at current dose 500 bid.  His ankles have been down in seated position and are edematous on examination. He continues to participate with PT and feels good subjectively. We discussed starting dose of lasix for pedal edema.    01/28/21- patient examined at bedside, s/p PT/OT session with slight dyspnea.  He was weaned to 10L O2 but then went back up to 13 post PT due to increased WOB.  He has complete resolution of LE edema and appears to be euvolemic at this point. We discussed reducing soluemdrol today to 20 bid.  I reviewed plan with Dr Leslye Peer today.  01/29/21- patient examined at bedside.  He had a good day and feels better. We reviewed medical plan and are reducing steroids to PO prednisone starting at 1m daily.   01/30/21-  Patient seen and examined at bedside.  CXR this am with  worsening pulmonary edema, included below.  Vitals stable with mild tachypnea and tachycardia with activity. Will deliver 1 dose IV lasix 20 today. Plan to continue Pred PO and cellcept at 500 bid. Pulmonary intensive PT with IS and will initiate Metaneb therapy with albuterol. Blood work today will be repeated CRP CMP phos procal ESR.  Patient is agreeable to BIPAP QHS and Metaneb BID.   01/31/21-  patient had set back yesterday with mild flash pulm edema on CXR. He diuresed very well overnight and feels back to where he was at previously.  He had transient reactive leukocytosis as well.  We initiated empirically zosyn and zithromax on top of bid bactrim.  This probably could be dcd since he recovered so quickly which would not occur with pneumonia.  Ill go head and dc this today but continue PO bactrim.  Will stay on pred 50 today with cellcept at 500 bid as previous.  His O2 has been reduced from 95 to 70% overnight and hes working with PT today. Hopefully we can wean this back down to 10L as he was previosly then back to his home setting.   02/01/21- patient is somewhat better today, he is being weaned on O2 and is now down to 40%. He had BM today. Leaving prednisone at current dose and cxr today.   02/02/21- patient is now on 15L HF bubbler.  He reports feeling better.  He had 3 bowel movements after stool softner. He is working with PT/OT.  His long term prognosis is poor unfortunately and we appreciate collaboration with Palliative care team.   PAST MEDICAL HISTORY   Past Medical History:  Diagnosis Date  . Chronic respiratory failure with hypoxia (HCC)    2L [place] continuous  . COVID-19   . Diabetes mellitus without complication ([place])   . Diverticulitis   . Follicular lymphoma of intra-abdominal lymph nodes ([place]) 02/15/2019  . ILD (interstitial lung disease) ([place])      SURGICAL HISTORY   Past Surgical History:  Procedure Laterality Date  . CHOLECYSTECTOMY    . COLON RESECTION     DUE TO  DIVERTICULITIS  . FLEXIBLE BRONCHOSCOPY N/A 12/02/2019   Procedure: FLEXIBLE BRONCHOSCOPY;  Surgeon: Ottie Glazier, MD;  Location: [facility];  Service: Thoracic;  Laterality: N/A;  . PORTA CATH INSERTION N/A 03/11/2019   Procedure: PORTA CATH INSERTION;  Surgeon: Algernon Huxley, MD;  Location: [facility];  Service: Cardiovascular;  Laterality: N/A;  . PULMONARY THROMBECTOMY N/A 01/29/2019   Procedure: PULMONARY THROMBECTOMY;  Surgeon: Algernon Huxley, MD;  Location: [facility];  Service: Cardiovascular;  Laterality: N/A;     FAMILY HISTORY   Family History  Problem Relation Age of Onset  . Diabetes Brother      SOCIAL HISTORY   Social History   Tobacco Use  . Smoking status: Never  . Smokeless tobacco: Never  Vaping Use  . Vaping Use: Never used  Substance Use Topics  . Alcohol use: Yes    Alcohol/week: 0.0 - 2.0 standard drinks    Comment: occasional  . Drug use: Never     MEDICATIONS    Home Medication:    Current Medication:  Current Facility-Administered Medications:  .  acetaminophen (TYLENOL) tablet 650 mg, 650 mg, Oral, Q6H PRN, 650 mg at 01/18/21 1411 **OR** acetaminophen (TYLENOL) suppository 650 mg, 650 mg, Rectal, Q6H PRN, Howerter, Justin B, DO .  acyclovir (ZOVIRAX) 200 MG capsule 400 mg, 400 mg, Oral, BID, Kathie Dike, MD, 400 mg at 02/02/21 0824 .  ALPRAZolam Duanne Moron) tablet 0.25 mg, 0.25 mg, Oral, BID PRN, Loletha Grayer, MD, 0.25 mg at 02/01/21 0937 .  apixaban (ELIQUIS) tablet 5 mg, 5 mg, Oral, BID, Beers, Shanon Brow, RPH, 5 mg at 02/02/21 0825 .  Chlorhexidine Gluconate Cloth 2 % PADS 6 each, 6 each, Topical, Daily, Kathie Dike, MD, 6 each at 02/02/21 1044 .  digoxin (LANOXIN) tablet 0.125 mg, 0.125 mg, Oral, Daily, Memon, Jolaine Artist, MD, 0.125 mg at 02/02/21 0824 .  diltiazem (CARDIZEM CD) 24 hr capsule 180 mg, 180 mg, Oral, Daily, Paraschos, Alexander, MD, 180 mg at 02/02/21 0825 .  diltiazem (CARDIZEM) injection 10 mg, 10 mg,  Intravenous, Q6H PRN, Wyvonnia Dusky, MD, 10 mg at 01/24/21 2328 .  docusate sodium (COLACE) capsule 200 mg, 200 mg, Oral, BID, Eppie Gibson M,  MD, 200 mg at 02/02/21 0824 .  feeding supplement (NEPRO CARB STEADY) liquid 237 mL, 237 mL, Oral, TID BM, Memon, Jehanzeb, MD, Last Rate: 0 mL/hr at 01/21/21 2130, 237 mL at 02/01/21 1454 .  furosemide (LASIX) injection 40 mg, 40 mg, Intravenous, Daily, Ottie Glazier, MD, 40 mg at 02/02/21 0823 .  guaiFENesin (MUCINEX) 12 hr tablet 1,200 mg, 1,200 mg, Oral, BID, Kathie Dike, MD, 1,200 mg at 02/02/21 0824 .  guaiFENesin-dextromethorphan (ROBITUSSIN DM) 100-10 MG/5ML syrup 5 mL, 5 mL, Oral, Q4H PRN, Kathie Dike, MD, 5 mL at 02/01/21 0937 .  insulin aspart (novoLOG) injection 0-20 Units, 0-20 Units, Subcutaneous, TID WC, Kathie Dike, MD, 3 Units at 02/02/21 0829 .  insulin aspart (novoLOG) injection 0-5 Units, 0-5 Units, Subcutaneous, QHS, Kathie Dike, MD, 4 Units at 01/31/21 2154 .  insulin aspart (novoLOG) injection 10 Units, 10 Units, Subcutaneous, TID WC, Loletha Grayer, MD, 10 Units at 02/02/21 0827 .  insulin glargine-yfgn (SEMGLEE) injection 9 Units, 9 Units, Subcutaneous, QHS, Wieting, Richard, MD .  lactulose (CHRONULAC) 10 GM/15ML solution 30 g, 30 g, Oral, BID PRN, Leslye Peer, Richard, MD .  levalbuterol Penne Lash) nebulizer solution 1.25 mg, 1.25 mg, Nebulization, Q4H PRN, Howerter, Justin B, DO, 1.25 mg at 01/17/21 2011 .  multivitamin with minerals tablet 1 tablet, 1 tablet, Oral, Daily, Kathie Dike, MD, 1 tablet at 02/02/21 0825 .  mycophenolate (CELLCEPT) capsule 500 mg, 500 mg, Oral, BID, Ottie Glazier, MD, 500 mg at 02/02/21 0824 .  polyethylene glycol (MIRALAX / GLYCOLAX) packet 17 g, 17 g, Oral, Daily, Loletha Grayer, MD, 17 g at 02/01/21 0937 .  predniSONE (DELTASONE) tablet 50 mg, 50 mg, Oral, Q breakfast, Da Authement, MD, 50 mg at 02/02/21 0824 .  simethicone (MYLICON) chewable tablet 80 mg, 80 mg,  Oral, QID PRN, Loletha Grayer, MD, 80 mg at 01/29/21 1411 .  sodium chloride (OCEAN) 0.65 % nasal spray 1 spray, 1 spray, Each Nare, PRN, Loletha Grayer, MD, 1 spray at 01/30/21 2142 .  sodium chloride flush (NS) 0.9 % injection 10-40 mL, 10-40 mL, Intracatheter, Q12H, Memon, Jolaine Artist, MD, 10 mL at 02/01/21 2116 .  sodium chloride flush (NS) 0.9 % injection 10-40 mL, 10-40 mL, Intracatheter, PRN, Kathie Dike, MD, 10 mL at 01/24/21 0915 .  sulfamethoxazole-trimethoprim (BACTRIM) 400-80 MG per tablet 1 tablet, 1 tablet, Oral, Q12H, Ottie Glazier, MD, 1 tablet at 02/02/21 0823 .  traZODone (DESYREL) tablet 50 mg, 50 mg, Oral, QHS PRN, Sharion Settler, NP, 50 mg at 02/01/21 2120  Facility-Administered Medications Ordered in Other Encounters:  .  heparin lock flush 100 unit/mL, 500 Units, Intravenous, Once, Earlie Server, MD    ALLERGIES   Penicillins     REVIEW OF SYSTEMS    Review of Systems:  Gen:  Denies  fever, sweats, chills weigh loss  HEENT: Denies blurred vision, double vision, ear pain, eye pain, hearing loss, nose bleeds, sore throat Cardiac:  No dizziness, chest pain or heaviness, chest tightness,edema Resp:   Denies cough or sputum porduction, shortness of breath,wheezing, hemoptysis,  Gi: Denies swallowing difficulty, stomach pain, nausea or vomiting, diarrhea, constipation, bowel incontinence Gu:  Denies bladder incontinence, burning urine Ext:   Denies Joint pain, stiffness or swelling Skin: Denies  skin rash, easy bruising or bleeding or hives Endoc:  Denies polyuria, polydipsia , polyphagia or weight change Psych:   Denies depression, insomnia or hallucinations   Other:  All other systems negative   VS: BP 108/67 (BP Location: Right Arm)   Pulse  93   Temp 98.7 F (37.1 C)   Resp 17   Ht 6' (1.829 m)   Wt 68.4 kg   SpO2 97%   BMI 20.47 kg/m      PHYSICAL EXAM    GENERAL:NAD, no fevers, chills, no weakness no fatigue HEAD: Normocephalic,  atraumatic.  EYES: Pupils equal, round, reactive to light. Extraocular muscles intact. No scleral icterus.  MOUTH: Moist mucosal membrane. Dentition intact. No abscess noted.  EAR, NOSE, THROAT: Clear without exudates. No external lesions.  NECK: Supple. No thyromegaly. No nodules. No JVD.  PULMONARY: Bilateral rhonchi improved from previous CARDIOVASCULAR: S1 and S2. Regular rate and rhythm. No murmurs, rubs, or gallops. No edema. Pedal pulses 2+ bilaterally.  GASTROINTESTINAL: Soft, nontender, nondistended. No masses. Positive bowel sounds. No hepatosplenomegaly.  MUSCULOSKELETAL: No swelling, clubbing, or edema. Range of motion full in all extremities.  NEUROLOGIC: Cranial nerves II through XII are intact. No gross focal neurological deficits. Sensation intact. Reflexes intact.  SKIN: No ulceration, lesions, rashes, or cyanosis. Skin warm and dry. Turgor intact.  PSYCHIATRIC: Mood, affect within normal limits. The patient is awake, alert and oriented x 3. Insight, judgment intact.       IMAGING    CT Angio Chest PE W/Cm &/Or Wo Cm  Result Date: 01/13/2021 CLINICAL DATA:  Respiratory distress, hypoxia EXAM: CT ANGIOGRAPHY CHEST WITH CONTRAST TECHNIQUE: Multidetector CT imaging of the chest was performed using the standard protocol during bolus administration of intravenous contrast. Multiplanar CT image reconstructions and MIPs were obtained to evaluate the vascular anatomy. CONTRAST:  65m OMNIPAQUE IOHEXOL 350 MG/ML SOLN COMPARISON:  10/26/2020, 01/13/2021 FINDINGS: Cardiovascular: This is a technically adequate evaluation of the pulmonary vasculature. No filling defects or pulmonary emboli. The heart is unremarkable without pericardial effusion. No evidence of thoracic aortic aneurysm or dissection. Right chest wall port via internal jugular approach tip within the superior vena cava. Mediastinum/Nodes: No pathologic adenopathy within the mediastinum, hila, or axilla. Thyroid, trachea, and  esophagus are unremarkable. Lungs/Pleura: There has been interval progression of the multifocal ground-glass airspace disease seen previously. Changes are most pronounced within the dependent lower lobes. No effusion or pneumothorax. The central airways are patent, with persistent bronchiectasis. Upper Abdomen: No acute abnormality. Musculoskeletal: No acute or destructive bony lesions. Reconstructed images demonstrate no additional findings. Review of the MIP images confirms the above findings. IMPRESSION: 1. No evidence of pulmonary embolus. 2. Progressive multifocal bilateral ground-glass airspace disease, greatest at the lung bases. The differential diagnosis would include progressive postinflammatory scarring and fibrosis, multifocal atypical pneumonia, or hypersensitivity/drug toxicity. 3. Stable bronchiectasis. Electronically Signed   By: MRanda NgoM.D.   On: 01/13/2021 17:13   DG Chest Port 1 View  Result Date: 02/01/2021 CLINICAL DATA:  Shortness of breath, edema EXAM: PORTABLE CHEST 1 VIEW COMPARISON:  01/30/2021 FINDINGS: Stable positioning of right IJ approach Port-A-Cath. Heart size within normal limits. Low lung volumes. Slight interval worsening of bilateral airspace opacities, left worse than right. No pleural effusion or pneumothorax. IMPRESSION: Slight interval worsening of bilateral airspace opacities, left worse than right. Electronically Signed   By: NDavina PokeD.O.   On: 02/01/2021 14:37   DG Chest Port 1 View  Result Date: 01/30/2021 CLINICAL DATA:  Acute on chronic hypoxemic respiratory failure. EXAM: PORTABLE CHEST 1 VIEW COMPARISON:  January 28, 2021 FINDINGS: No pneumothorax. Stable cardiomediastinal silhouette and right Port-A-Cath. Left-greater-than-right pulmonary opacities/infiltrates. No other interval changes. IMPRESSION: 1. Persistent low lung volumes and bilateral pulmonary infiltrates. No interval changes. Electronically  Signed   By: Dorise Bullion III M.D.    On: 01/30/2021 08:36   DG Chest Port 1 View  Result Date: 01/28/2021 CLINICAL DATA:  Shortness of breath. EXAM: PORTABLE CHEST 1 VIEW COMPARISON:  Multiple recent chest films. FINDINGS: The right IJ power port is stable. Persistent low lung volumes and bilateral pulmonary infiltrates. No definite pleural effusion or pneumothorax. IMPRESSION: Persistent low lung volumes and bilateral pulmonary infiltrates. Electronically Signed   By: Marijo Sanes M.D.   On: 01/28/2021 16:03   DG Chest Port 1 View  Result Date: 01/26/2021 CLINICAL DATA:  Shortness of breath, abnormal chest x-ray EXAM: PORTABLE CHEST 1 VIEW COMPARISON:  01/24/2021 FINDINGS: Right Port-A-Cath remains in place, unchanged. Low lung volumes with bilateral airspace opacities, most pronounced in the left perihilar and lower lobe, unchanged since prior study. No visible effusions or pneumothorax. Heart is normal size. IMPRESSION: Low lung volumes with bilateral airspace disease, left-greater-than-right. No significant change. Electronically Signed   By: Rolm Baptise M.D.   On: 01/26/2021 09:42   DG Chest Port 1 View  Result Date: 01/24/2021 CLINICAL DATA:  Follow-up multifocal pneumonia. EXAM: PORTABLE CHEST 1 VIEW COMPARISON:  01/22/2021 and older exams. FINDINGS: Mild interval decrease in the airspace lung opacities, most evident on the left. Lung volumes remain low. No new lung abnormalities. No visualized pleural effusion.  No pneumothorax. Right internal jugular Port-A-Cath is stable. IMPRESSION: 1. Mild interval improvement in lung aeration with decreased airspace lung opacities, most notably on the left. Electronically Signed   By: Lajean Manes M.D.   On: 01/24/2021 07:57   DG Chest Port 1 View  Result Date: 01/22/2021 CLINICAL DATA:  Hypoxia, shortness of breath. EXAM: PORTABLE CHEST 1 VIEW COMPARISON:  January 20, 2021. FINDINGS: Stable cardiomediastinal silhouette. Right internal jugular Port-A-Cath is unchanged in position.  Hypoinflation of the lungs is noted. Stable bilateral lung opacities are noted, left greater than right, most consistent with multifocal pneumonia. Bony thorax is unremarkable. IMPRESSION: Hypoinflation of the lungs. Stable bilateral lung opacities are noted, left greater than right, most consistent with multifocal pneumonia. Electronically Signed   By: Marijo Conception M.D.   On: 01/22/2021 19:26   DG Chest Port 1 View  Result Date: 01/20/2021 CLINICAL DATA:  Pneumonitis EXAM: PORTABLE CHEST 1 VIEW COMPARISON:  01/19/2021 FINDINGS: Shallow inspiration. Patchy infiltrates in the left lung and right lung base, similar to prior study and likely multifocal pneumonia. No pleural effusions. No pneumothorax. Mediastinal contours appear intact. Heart size is normal. Power port type central venous catheter on the right with tip over the cavoatrial junction region. Surgical clips in the right upper quadrant. IMPRESSION: Shallow inspiration. Patchy infiltrates in both lungs, likely multifocal pneumonia, without change. Electronically Signed   By: Lucienne Capers M.D.   On: 01/20/2021 03:56   DG Chest Port 1 View  Result Date: 01/19/2021 CLINICAL DATA:  Abnormality on previous chest x-ray in a 60 year old male. EXAM: PORTABLE CHEST 1 VIEW COMPARISON:  CT from January 13, 2021, chest x-ray January 18, 2021. FINDINGS: RIGHT-sided Port-A-Cath terminates at the caval to atrial junction. Trachea is midline. Cardiomediastinal contours are stable. Interstitial and airspace opacities throughout the chest worse in the LEFT mid to upper chest, RIGHT upper chest lung bases bilaterally with similar appearance. On limited assessment there is no acute skeletal process. EKG leads project over the chest. IMPRESSION: No interval change in the appearance of the chest since previous imaging with continued decreased lung volumes and patchy bilateral opacities. Electronically  Signed   By: Zetta Bills M.D.   On: 01/19/2021 07:59    DG Chest Port 1 View  Result Date: 01/18/2021 CLINICAL DATA:  Respiratory distress EXAM: PORTABLE CHEST 1 VIEW COMPARISON:  01/17/2021 FINDINGS: Cardiac shadow is stable. Right-sided chest wall port is again seen. Lungs are hypoinflated. Patchy airspace opacities again identified in both lungs left greater than right but stable from the prior exam. IMPRESSION: Patchy airspace opacities bilaterally stable from the prior exam. Electronically Signed   By: Inez Catalina M.D.   On: 01/18/2021 17:24   DG Chest Port 1 View  Result Date: 01/17/2021 CLINICAL DATA:  Worsening shortness of breath. EXAM: PORTABLE CHEST 1 VIEW COMPARISON:  01/14/2021 FINDINGS: 0826 hours. Low volume film. Patchy airspace disease again noted in both lungs, left greater than right. Cardiopericardial silhouette is at upper limits of normal for size. Right Port-A-Cath again noted. Telemetry leads overlie the chest. IMPRESSION: Low volume film with patchy bilateral airspace disease, left greater than right. No substantial change. Electronically Signed   By: Misty Stanley M.D.   On: 01/17/2021 08:53   DG Chest Port 1 View  Result Date: 01/14/2021 CLINICAL DATA:  Shortness of breath.  History of cancer.  Diabetes. EXAM: PORTABLE CHEST 1 VIEW.  Patient is rotated. COMPARISON:  Chest x-ray 01/13/2021, CT chest 01/13/2021 FINDINGS: Accessed right chest wall Port-A-Cath in stable position. The heart and mediastinal contours are within normal limits. Low lung volumes with persistent diffuse patchy airspace opacities. No pleural effusion. No pneumothorax. No acute osseous abnormality. Right upper quadrant surgical clips. IMPRESSION: Low lung volumes with persistent diffuse patchy airspace opacities. Electronically Signed   By: Iven Finn M.D.   On: 01/14/2021 21:08   DG Chest Port 1 View  Result Date: 01/13/2021 CLINICAL DATA:  Shortness of breath EXAM: PORTABLE CHEST 1 VIEW COMPARISON:  Chest radiograph 07/29/2019 FINDINGS: There is a  right chest wall port with the tip terminating in the lower SVC/cavoatrial junction. The cardiomediastinal silhouette is within normal limits. Lung volumes are low with unchanged asymmetric elevation of the right hemidiaphragm. There are increased interstitial markings with diffuse reticular opacities throughout both lungs. There is no focal consolidation. There is no significant pleural effusion. There is no pneumothorax. There is no acute osseous abnormality. There is gaseous distention of the stomach and large bowel in the right upper quadrant. IMPRESSION: Low lung volumes with suspected mild pulmonary interstitial edema. Atypical/viral infection could have a similar appearance. Electronically Signed   By: Valetta Mole M.D.   On: 01/13/2021 14:24           ASSESSMENT/PLAN   Acute hypoxemic respiratory failure Due to acute exacerbation of interstitial lung disease  - present on admission  - COVID19 negative   - supplemental O2 during my evaluation 10l/min>>8L>>HFNC  -Respiratory viral panel-negative  -serum fungitell-negative  -legionella ab-negative  -nasal MRSA PCR-negative -Procalcitonin trend reviewed -strep pneumoniae ur AG -Histoplasma Ur Ag-negative -sputum resp cultures -AFB sputum expectorated specimen -sputum cytology  -completed  Cefepime course for cap -Reduced Solumedrol to 40 BID - 01/15/21>>50 BID>>40 BID>>30 BID>>20 BID>>pred 50 -reviewed pertinent imaging with patient today - ESR and CRP are elevated but improving -PT/OT for d/c planning  -please encourage patient to use incentive spirometer few times each hour while hospitalized.   -continue Cellcept 250 bid >>500 bid  -dcd lasix 01/28/21 -UOP is adequate -Discussed goal of care - recommend DNR patient is in agreement but still discussing with wife.  -lasix 40 x 1 today -  01/30/21  Acute blood loss anemia-resolved    - noted Hb trending down from 14 to 10 -    -fecal occult blood test    - may be due to GI  bleed due to eliquis - monitor for grossly visible bleeding and stable vital sings    Thank you for allowing me to participate in the care of this patient.   Patient/Family are satisfied with care plan and all questions have been answered.   This document was prepared using Dragon voice recognition software and may include unintentional dictation errors.     Ottie Glazier, M.D.  Division of [place]

## 2021-02-03 ENCOUNTER — Other Ambulatory Visit: Payer: Self-pay

## 2021-02-03 DIAGNOSIS — I480 Paroxysmal atrial fibrillation: Secondary | ICD-10-CM | POA: Diagnosis not present

## 2021-02-03 DIAGNOSIS — J849 Interstitial pulmonary disease, unspecified: Secondary | ICD-10-CM | POA: Diagnosis not present

## 2021-02-03 DIAGNOSIS — J9621 Acute and chronic respiratory failure with hypoxia: Secondary | ICD-10-CM | POA: Diagnosis not present

## 2021-02-03 DIAGNOSIS — E871 Hypo-osmolality and hyponatremia: Secondary | ICD-10-CM

## 2021-02-03 LAB — CBC
HCT: 30.8 % — ABNORMAL LOW (ref 39.0–52.0)
Hemoglobin: 10.6 g/dL — ABNORMAL LOW (ref 13.0–17.0)
MCH: 28.6 pg (ref 26.0–34.0)
MCHC: 34.4 g/dL (ref 30.0–36.0)
MCV: 83 fL (ref 80.0–100.0)
Platelets: 183 10*3/uL (ref 150–400)
RBC: 3.71 MIL/uL — ABNORMAL LOW (ref 4.22–5.81)
RDW: 18.3 % — ABNORMAL HIGH (ref 11.5–15.5)
WBC: 11.4 10*3/uL — ABNORMAL HIGH (ref 4.0–10.5)
nRBC: 0 % (ref 0.0–0.2)

## 2021-02-03 LAB — GLUCOSE, CAPILLARY
Glucose-Capillary: 130 mg/dL — ABNORMAL HIGH (ref 70–99)
Glucose-Capillary: 127 mg/dL — ABNORMAL HIGH (ref 70–99)
Glucose-Capillary: 138 mg/dL — ABNORMAL HIGH (ref 70–99)
Glucose-Capillary: 180 mg/dL — ABNORMAL HIGH (ref 70–99)

## 2021-02-03 LAB — C-REACTIVE PROTEIN: CRP: 3.5 mg/dL — ABNORMAL HIGH (ref <1.0)

## 2021-02-03 LAB — DIGOXIN LEVEL: Digoxin Level: 0.5 ng/mL — ABNORMAL LOW (ref 0.8–2.0)

## 2021-02-03 LAB — SEDIMENTATION RATE: Sed Rate: 41 mm/hr — ABNORMAL HIGH (ref 0–20)

## 2021-02-03 MED ORDER — ALPRAZOLAM 0.25 MG PO TABS
0.2500 mg | ORAL_TABLET | Freq: Every evening | ORAL | Status: DC | PRN
  Administered 2021-02-13: 0.25 mg via ORAL
  Filled 2021-02-03 (×3): qty 1

## 2021-02-03 MED ORDER — ALPRAZOLAM 0.25 MG PO TABS
0.2500 mg | ORAL_TABLET | Freq: Every morning | ORAL | Status: DC
  Administered 2021-02-04 – 2021-02-19 (×16): 0.25 mg via ORAL
  Filled 2021-02-03 (×15): qty 1

## 2021-02-03 MED ORDER — MORPHINE SULFATE (PF) 2 MG/ML IV SOLN
1.0000 mg | INTRAVENOUS | Status: DC | PRN
Start: 2021-02-03 — End: 2021-02-19
  Administered 2021-02-04 – 2021-02-19 (×7): 1 mg via INTRAVENOUS
  Filled 2021-02-03 (×8): qty 1

## 2021-02-03 NOTE — Progress Notes (Signed)
Physical Therapy Treatment Patient Details Name: Edward Atkinson MRN: [id number] DOB: 1960-05-16 Today's Date: 02/03/2021   History of Present Illness Pt is a 60 y.o. male with medical history significant for chronic hypoxic respiratory failure on 2 L continuous nasal cannula in the setting of interstitial lung disease, type 2 diabetes mellitus, pulmonary embolism in 2020 now chronically anticoagulated on Eliquis, who arrived to ED with shortness of breath. MD assessment includes:  acute on chronic hypoxic respiratory failure with hypoxia, possible sepsis due to bilateral pneumonia, rapid atrial fibrillation, anemia, and mild elevation of troponin with suspected demand ischemia.    PT Comments    Pt was long sitting in bed on 8 L HFNC. He is extremely pleasant and motivated throughout. SEVERELY limited by respiratory response to minimal activity. Pt did not require physical assistance to exit bed. Stood and took ~ 5 steps to recliner however does desaturate to 79% and RR elevation into upper 40s.Prolonged recovery time required. Pt was recommended to perform seated there ex throughout the day in both UEs/Les. RN aware and will continue to monitor sao2/RR. Pt moves well but may need w/c for long distances due to respiratory concerns. Acute PT will continue to follow and progress as able per current POC.    Recommendations for follow up therapy are one component of a multi-disciplinary discharge planning process, led by the attending physician.  Recommendations may be updated based on patient status, additional functional criteria and insurance authorization.  Follow Up Recommendations  Home health PT;Supervision for mobility/OOB     Equipment Recommendations  Rolling walker with 5" wheels;3in1 (PT)       Precautions / Restrictions Precautions Precautions: Fall Precaution Comments: O2!!! Restrictions Weight Bearing Restrictions: No     Mobility  Bed Mobility Overal bed mobility: Modified  Independent                  Transfers Overall transfer level: Needs assistance Equipment used: None Transfers: Sit to/from Stand Sit to Stand: Min guard         General transfer comment: CGA for safety. limited standing endurance  Ambulation/Gait Ambulation/Gait assistance: Min guard Gait Distance (Feet): 5 Feet Assistive device: None Gait Pattern/deviations: Step-through pattern;Decreased step length - right;Decreased step length - left Gait velocity: decreased   General Gait Details: pt becomes SOB with desaturation during verylimited standing activity. prolonged recovery time. RN aware.   Stairs             Wheelchair Mobility    Modified Rankin (Stroke Patients Only)       Balance Overall balance assessment: Needs assistance Sitting-balance support: Feet supported;No upper extremity supported Sitting balance-Leahy Scale: Normal     Standing balance support: No upper extremity supported;During functional activity Standing balance-Leahy Scale: Good Standing balance comment: limited time to assess due to SOB/desaturation                            Cognition Arousal/Alertness: Awake/alert Behavior During Therapy: WFL for tasks assessed/performed Overall Cognitive Status: Within Functional Limits for tasks assessed                                 General Comments: Very pleasant & agreeable to tx.      Exercises      General Comments        Pertinent Vitals/Pain Pain Assessment: No/denies pain    Home  Living                      Prior Function            PT Goals (current goals can now be found in the care plan section) Acute Rehab PT Goals Patient Stated Goal: walk again without being so SOB Progress towards PT goals: Progressing toward goals    Frequency    Min 2X/week      PT Plan Current plan remains appropriate    Co-evaluation              AM-PAC PT "6 Clicks" Mobility    Outcome Measure  Help needed turning from your back to your side while in a flat bed without using bedrails?: None Help needed moving from lying on your back to sitting on the side of a flat bed without using bedrails?: None Help needed moving to and from a bed to a chair (including a wheelchair)?: A Little Help needed standing up from a chair using your arms (e.g., wheelchair or bedside chair)?: A Little Help needed to walk in hospital room?: A Little Help needed climbing 3-5 steps with a railing? : A Little 6 Click Score: 20    End of Session         PT Visit Diagnosis: Muscle weakness (generalized) (M62.81);Difficulty in walking, not elsewhere classified (R26.2)     Time: 2482-5003 PT Time Calculation (min) (ACUTE ONLY): 18 min  Charges:  $Therapeutic Activity: 8-22 mins                    Julaine Fusi PTA 02/03/21, 2:06 PM

## 2021-02-03 NOTE — Progress Notes (Signed)
PROGRESS NOTE    Edward Atkinson  VFI:[id number] DOB: [birth year] DOA: 01/13/2021 PCP: Rusty Aus, MD   Chief complaint for shortness of breath. 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  Patient was admitted 20 days ago with acute hypoxic respiratory failure and interstitial lung disease exacerbation.  Patient had a CT scan of the chest that did not show any pulmonary embolism.  The patient was on heated high flow nasal cannula during the course and then was tapered over to bubble high flow nasal cannula.  Patient condition appears to be fluctuating, currently, he is back on 45% of heated high flow again today.  To complete a course of antibiotics.  He is still on steroids.  She has been followed by pulmonology.  Assessment & Plan:   Principal Problem:   Acute on chronic respiratory failure with hypoxia (HCC) Active Problems:   Type 2 diabetes mellitus with hypoglycemia without coma, without long-term current use of insulin (HCC)   Follicular lymphoma (HCC)   SOB (shortness of breath)   Interstitial lung disease (HCC)   Severe sepsis (HCC)   CAP (community acquired pneumonia)   Elevated troponin   Protein calorie malnutrition (HCC)   Protein-calorie malnutrition, severe   AF (paroxysmal atrial fibrillation) (HCC)   Thrombocytosis   Leukocytosis   Pressure injury of skin  Acute on chronic hypoxemic respiratory failure. Interstitial lung disease exacerbation. Patient has finished her course of antibiotics, procalcitonin level is less than 0.1.  Patient does not have additional bacterial infection. I will also check a BMP to make sure patient does not have volume overload. Currently patient is on prednisone and CellCept and followed by pulmonology.  He is also on prophylactic Bactrim and acyclovir. Patient condition appears to be at end-stage,  palliative care is following.  Prognosis is poor.  Severe protein calorie malnutrition  Patient appear very malnourished with a severe muscle atrophy. This is probably secondary to long-term hypoxemia. Will start protein supplement.  Paroxysmal atrial fibrillation. Patient is on Eliquis and rate controlled  Follicular lymphoma.      DVT prophylaxis: Eliquis Code Status: full Family Communication:  Disposition Plan:    Status is: Inpatient  Remains inpatient appropriate because:Inpatient level of care appropriate due to severity of illness, patient has severe acute respite failure on  high flow oxygen.  Dispo: The patient is from: Home              Anticipated d/c is to: Home              Patient currently is not medically stable to d/c.   Difficult to place patient No        I/O last 3 completed shifts: In: 64 [P.O.:960] Out: 1925 [Urine:1925] Total I/O In: 27 [P.O.:480] Out: 3 [Urine:450]     Consultants:  Pulmonology  Procedures: None  Antimicrobials: None  Subjective: Patient developed severe hypoxemia again today, with put back on 45% heated high flow oxygen.  Cough, nonproductive. No chest pain or palpitation. No abdominal pain or nausea vomiting. No fever or chills. No dysuria hematuria  Objective: Vitals:   02/03/21 0923 02/03/21 1151 02/03/21 1428 02/03/21 1552  BP: 113/72 102/73  115/62  Pulse: 97 (!) 110  99  Resp: 20 19  18   Temp: 98.3 F (36.8 C) 98.5 F (36.9 C)  98.7 F (37.1 C)  TempSrc: Oral Oral  Oral  SpO2: 100% (!) 86% 92% 95%  Weight:      Height:        Intake/Output Summary (Last 24 hours) at 02/03/2021 1617 Last data filed at 02/03/2021 1125 Gross per 24 hour  Intake 480 ml  Output 1075 ml  Net -595 ml   Filed Weights   02/01/21 0558 02/02/21 0500 02/03/21 0404  Weight: 68.9 kg 68.4 kg 67.2 kg    Examination:  General exam: Appears calm and comfortable, malnourished Respiratory system: Crackles in the  base. Respiratory effort normal. Cardiovascular system: S1 & S2 heard, RRR. No JVD, murmurs, rubs, gallops or clicks. No pedal edema. Gastrointestinal system: Abdomen is nondistended, soft and nontender. No organomegaly or masses felt. Normal bowel sounds heard. Central nervous system: Alert and oriented. No focal neurological deficits. Extremities: Significant muscle atrophy Skin: No rashes, lesions or ulcers Psychiatry: Judgement and insight appear normal. Mood & affect appropriate.     Data Reviewed: I have personally reviewed following labs and imaging studies  CBC: Recent Labs  Lab 01/30/21 1125 01/31/21 0600 02/01/21 0605 02/03/21 0530  WBC 39.5* 18.3* 15.2* 11.4*  NEUTROABS 34.4*  --  13.3*  --   HGB 12.2* 10.3* 10.2* 10.6*  HCT 36.2* 30.2* 30.2* 30.8*  MCV 84.2 83.7 83.7 83.0  PLT 319 182 179 254   Basic Metabolic Panel: Recent Labs  Lab 01/30/21 1125 01/31/21 0600 02/01/21 0605  NA 131* 134* 134*  K 3.9 3.8 3.8  CL 93* 92* 95*  CO2 25 33* 36*  GLUCOSE 163* 130* 127*  BUN 22 18 18   CREATININE 0.71 0.58* 0.44*  CALCIUM 8.0* 8.0* 8.0*  PHOS 2.9  --   --    GFR: Estimated Creatinine Clearance: 91 mL/min (A) (by C-G formula based on SCr of 0.44 mg/dL (L)). Liver Function Tests: Recent Labs  Lab 01/30/21 1125 02/01/21 0605  AST 38 19  ALT 38 28  ALKPHOS 70 56  BILITOT 0.9 0.7  PROT 4.8* 4.5*  ALBUMIN 2.7* 2.3*   No results for input(s): LIPASE, AMYLASE in the last 168 hours. No results for input(s): AMMONIA in the last 168 hours. Coagulation Profile: No results for input(s): INR, PROTIME in the last 168 hours. Cardiac Enzymes: No results for input(s): CKTOTAL, CKMB, CKMBINDEX, TROPONINI in the last 168 hours. BNP (last 3 results) No results for input(s): PROBNP in the last 8760 hours. HbA1C: No results for input(s): HGBA1C in the last 72 hours. CBG: Recent Labs  Lab 02/02/21 1523 02/02/21 2058 02/03/21 0819 02/03/21 1152 02/03/21 1550   GLUCAP 353* 198* 130* 127* 138*   Lipid Profile: No results for input(s): CHOL, HDL, LDLCALC, TRIG, CHOLHDL, LDLDIRECT in the last 72 hours. Thyroid Function Tests: No results for input(s): TSH, T4TOTAL, FREET4, T3FREE, THYROIDAB in the last 72 hours. Anemia Panel: No results for input(s): VITAMINB12, FOLATE, FERRITIN, TIBC, IRON, RETICCTPCT in the last 72 hours. Sepsis Labs: Recent Labs  Lab 01/30/21 1125 02/01/21 0605  PROCALCITON <0.10 <0.10    No results found for this or any previous visit (from the past 240 hour(s)).       Radiology Studies: No results found.      Scheduled Meds:  [START ON 02/04/2021] ALPRAZolam  0.25 mg Oral q morning   apixaban  5 mg Oral BID   Chlorhexidine Gluconate Cloth  6 each Topical Daily   digoxin  0.125 mg Oral Daily   diltiazem  180 mg Oral Daily   docusate sodium  200 mg Oral BID  feeding supplement (NEPRO CARB STEADY)  237 mL Oral TID BM   furosemide  20 mg Intravenous Daily   guaiFENesin  1,200 mg Oral BID   insulin aspart  0-20 Units Subcutaneous TID WC   insulin aspart  0-5 Units Subcutaneous QHS   insulin aspart  10 Units Subcutaneous TID WC   insulin glargine-yfgn  9 Units Subcutaneous QHS   multivitamin with minerals  1 tablet Oral Daily   mycophenolate  500 mg Oral BID   polyethylene glycol  17 g Oral Daily   predniSONE  50 mg Oral Q breakfast   sodium chloride flush  10-40 mL Intracatheter Q12H   sulfamethoxazole-trimethoprim  1 tablet Oral Q12H   Continuous Infusions:   LOS: 21 days    Time spent: 32 minutes    Sharen Hones, MD [facility]   To contact the attending provider between 7A-7P or the covering provider during after hours 7P-7A, please log into the web site [email or web address] and access using universal [place] password for that web site. If you do not have the password, please call the hospital operator.  02/03/2021, 4:17 PM

## 2021-02-03 NOTE — Progress Notes (Signed)
Pulmonary Medicine          Date: 02/03/2021,   MRN# [id number] Edward Atkinson Mar 02, 1961     AdmissionWeight: 63.5 kg                 CurrentWeight: 67.2 kg   Referring physician: Dr Roderic Palau   CHIEF COMPLAINT:   Acute on chronic hypoxemic respiratory failure   HISTORY OF PRESENT ILLNESS   60 yo M w/hx of chronic hypoxemia and ILD post COVID19, T2DM, PE on eliquis, Lymphoma s/p Rituxan which is completed.  Came in due to worsening SOB and DOE specifically mild exertional defacation/urination with feelings of presyncope and severe dyspnea which started to get worse post tapering from steroid appx few days prior to onset of symptom progression.  He was placed on solumedrol and reported mild improvement.  He is on 10L /min [place] during my evaluation.  He had CT chest done with PE protocol and noted to have bilateral GGO infiltrates worse compared to June study. PCCM consultation for further evaluation and management.     01/17/21-  patient seems to be slighly clinically worse post reduction of IV steroids yesterday indicative of ongoing ILD exacerbation, thus far infectious workup is negative but inflammatory biomarkers are highly elevated also pointing towards ILD exacerbation.  There is new CXR in process this morning for interval changes. We discussed increasing steroids slightly and giving more time.  I will also initiate steroid sparing immunomodulation with BID cellcept as patient will likely need therapy for prolonged time period.  Compensatory tachycardia noted appreciate cardiology team.  Sugars with peaks and valleys with adjusted steroid therapy.  Patient with muscle wasting and protein calorie malnutrition but eating better this am.  01/18/21- patient is on HFNC at 60%, feels better, has not been able to tolerate BIPAP.  Wife is at bedside.  BP is borderline low MAP 76.  He has negative infectious workup and thus far ILD exacerbation seems to be main differential. He  continues to have peaks and valleys in blood glucose.  We started steroid sparing regimen with cellcept bid 250 mg and will increase dose as we taper off prednisone.  Remains tachyarrtythmic but improved. Appreciate everyone involved.     01/19/21- patient seen and examined at bedside.  Wife present during evaluation.  Ow HFNC weaned to 41%/45L/MIN.  CRP is still up, have increased cellcept and reducing solumedrol.  Patient felt stronger physically and was able to get OOB to chair. Cough is less now.  He is non-labored with breathing no accessory mm use today. He had dark stools with reduced hb, FOBT ordred.  Haptoglobin is elevated. Pathology slide review ordered.   01/20/21- patient is essentially unchanged from yesterday from pulmonary perspective. CXR also unchanged,  bloodwork with reduction in CRP.  Plan to wean O2 and continue OOB with PT/OT,  Continue current steroid dose and Cellcept.   01/21/21- patient is stable on HFNC.  He had small set back with increased O2 req after exerting himself in bathroom.  HFNC from 41/45 to 54/45.   He diuresed very well with lasix 20 bid >2L urine in past 24h.  Tachycardia is improved. He does not feel weaker and infact states he feels slowly improved.   01/22/21- I spoke to patient regarding goals of care and recommend DNR.  He is agreeable but I asked him to discuss with wife and family out of respect and we will revisit tommorow.  He states he is feeling better  and CRP is trending down but he remains on HFNC 60/40.  He was able to get OOB today and walked to bathroom but experienced mild desaturation with quick recovery "not much issues".      01/23/21- patient is still on high flow nasal canula , he is on 56%/45L.  We reviewed code status and he is still in discussion with family regarding DNR.   We discussed lung transplant per family request and this may be a future option if he is strong enough and recovers.  I have increased insluin TID to 20 from 15 and  increased bactrim to bid SS   01/24/21- patient weaned on HFNC to 49/45, he was able to use BIPAP overnight thinks its helping.  CXR this AM with interval improvement. Will keep on current dose of meds.  Repeat CRP in am.  Last BM yesterday well formed no blood, eating good, urine output very good on 20 bid lasix. Getting OOB with  transient desaturation.  Overall slight improvement.    01/25/21- patient has weaned down to 35%/40L/min.  He remains full code. Reviewed medical plan with Dr Jimmye Norman today.  Reduced steroids from 50 BID to 40 BID and also reduced insulin slightly.    01/26/21- patient is further weaned to 15L humidified HF nasal canula.  He did PT ROM exercises with mild desaturation. CXR today with expiratory image unable to see much interval changes. Patient reports improvement clinically. Plan to continue current regimen. Sugars slightly high due to steroids, will wean steroids soon. Continue cellcept as current 500 bid  01/27/21- patient is relatively same as yesterday, his O2 is weaned down to 14L.  He remains on IV solumedrol at 50 bid, we did discuss weaning this further to 40 BID. Will keep cellcept at current dose 500 bid.  His ankles have been down in seated position and are edematous on examination. He continues to participate with PT and feels good subjectively. We discussed starting dose of lasix for pedal edema.    01/28/21- patient examined at bedside, s/p PT/OT session with slight dyspnea.  He was weaned to 10L O2 but then went back up to 13 post PT due to increased WOB.  He has complete resolution of LE edema and appears to be euvolemic at this point. We discussed reducing soluemdrol today to 20 bid.  I reviewed plan with Dr Leslye Peer today.  01/29/21- patient examined at bedside.  He had a good day and feels better. We reviewed medical plan and are reducing steroids to PO prednisone starting at 1m daily.   01/30/21-  Patient seen and examined at bedside.  CXR this am with  worsening pulmonary edema, included below.  Vitals stable with mild tachypnea and tachycardia with activity. Will deliver 1 dose IV lasix 20 today. Plan to continue Pred PO and cellcept at 500 bid. Pulmonary intensive PT with IS and will initiate Metaneb therapy with albuterol. Blood work today will be repeated CRP CMP phos procal ESR.  Patient is agreeable to BIPAP QHS and Metaneb BID.   01/31/21-  patient had set back yesterday with mild flash pulm edema on CXR. He diuresed very well overnight and feels back to where he was at previously.  He had transient reactive leukocytosis as well.  We initiated empirically zosyn and zithromax on top of bid bactrim.  This probably could be dcd since he recovered so quickly which would not occur with pneumonia.  Ill go head and dc this today but continue PO bactrim.  Will stay on pred 50 today with cellcept at 500 bid as previous.  His O2 has been reduced from 95 to 70% overnight and hes working with PT today. Hopefully we can wean this back down to 10L as he was previosly then back to his home setting.   02/01/21- patient is somewhat better today, he is being weaned on O2 and is now down to 40%. He had BM today. Leaving prednisone at current dose and cxr today.   02/02/21- patient is now on 15L HF bubbler.  He reports feeling better.  He had 3 bowel movements after stool softner. He is working with PT/OT.  His long term prognosis is poor unfortunately and we appreciate collaboration with Palliative care team.   02/03/21- patient had over exerted himself again and required increased O2. He states he feels better and just did more then he should have with PT.  His wbc count is trending down. His CRP is trending down.  I have started morhpine for air hunger and pain at sacrum for skin breakdown and pressure ulceration.     PAST MEDICAL HISTORY   Past Medical History:  Diagnosis Date  . Chronic respiratory failure with hypoxia (HCC)    2L [place] continuous  . COVID-19    . Diabetes mellitus without complication ([place])   . Diverticulitis   . Follicular lymphoma of intra-abdominal lymph nodes ([place]) 02/15/2019  . ILD (interstitial lung disease) ([place])      SURGICAL HISTORY   Past Surgical History:  Procedure Laterality Date  . CHOLECYSTECTOMY    . COLON RESECTION     DUE TO DIVERTICULITIS  . FLEXIBLE BRONCHOSCOPY N/A 12/02/2019   Procedure: FLEXIBLE BRONCHOSCOPY;  Surgeon: Ottie Glazier, MD;  Location: [facility] ORS;  Service: Thoracic;  Laterality: N/A;  . PORTA CATH INSERTION N/A 03/11/2019   Procedure: PORTA CATH INSERTION;  Surgeon: Algernon Huxley, MD;  Location: [place] [facility];  Service: Cardiovascular;  Laterality: N/A;  . PULMONARY THROMBECTOMY N/A 01/29/2019   Procedure: PULMONARY THROMBECTOMY;  Surgeon: Algernon Huxley, MD;  Location: [place] [facility];  Service: Cardiovascular;  Laterality: N/A;     FAMILY HISTORY   Family History  Problem Relation Age of Onset  . Diabetes Brother      SOCIAL HISTORY   Social History   Tobacco Use  . Smoking status: Never  . Smokeless tobacco: Never  Vaping Use  . Vaping Use: Never used  Substance Use Topics  . Alcohol use: Yes    Alcohol/week: 0.0 - 2.0 standard drinks    Comment: occasional  . Drug use: Never     MEDICATIONS    Home Medication:    Current Medication:  Current Facility-Administered Medications:  .  acetaminophen (TYLENOL) tablet 650 mg, 650 mg, Oral, Q6H PRN, 650 mg at 01/18/21 1411 **OR** acetaminophen (TYLENOL) suppository 650 mg, 650 mg, Rectal, Q6H PRN, Howerter, Justin B, DO .  ALPRAZolam (XANAX) tablet 0.25 mg, 0.25 mg, Oral, BID PRN, Loletha Grayer, MD, 0.25 mg at 02/01/21 0937 .  apixaban (ELIQUIS) tablet 5 mg, 5 mg, Oral, BID, Beers, Shanon Brow, RPH, 5 mg at 02/03/21 0926 .  Chlorhexidine Gluconate Cloth 2 % PADS 6 each, 6 each, Topical, Daily, Kathie Dike, MD, 6 each at 02/02/21 1044 .  digoxin (LANOXIN) tablet 0.125 mg, 0.125 mg, Oral, Daily, Memon,  Jolaine Artist, MD, 0.125 mg at 02/03/21 0926 .  diltiazem (CARDIZEM CD) 24 hr capsule 180 mg, 180 mg, Oral, Daily, Paraschos, Alexander, MD, 180 mg  at 02/03/21 0925 .  diltiazem (CARDIZEM) injection 10 mg, 10 mg, Intravenous, Q6H PRN, Wyvonnia Dusky, MD, 10 mg at 01/24/21 2328 .  docusate sodium (COLACE) capsule 200 mg, 200 mg, Oral, BID, Wyvonnia Dusky, MD, 200 mg at 02/03/21 7824 .  feeding supplement (NEPRO CARB STEADY) liquid 237 mL, 237 mL, Oral, TID BM, Memon, Jehanzeb, MD, Last Rate: 0 mL/hr at 01/21/21 2130, 237 mL at 02/01/21 1454 .  furosemide (LASIX) injection 20 mg, 20 mg, Intravenous, Daily, Ottie Glazier, MD, 20 mg at 02/03/21 0925 .  guaiFENesin (MUCINEX) 12 hr tablet 1,200 mg, 1,200 mg, Oral, BID, Kathie Dike, MD, 1,200 mg at 02/03/21 0926 .  guaiFENesin-dextromethorphan (ROBITUSSIN DM) 100-10 MG/5ML syrup 5 mL, 5 mL, Oral, Q4H PRN, Kathie Dike, MD, 5 mL at 02/01/21 0937 .  insulin aspart (novoLOG) injection 0-20 Units, 0-20 Units, Subcutaneous, TID WC, Kathie Dike, MD, 3 Units at 02/03/21 [phone] .  insulin aspart (novoLOG) injection 0-5 Units, 0-5 Units, Subcutaneous, QHS, Kathie Dike, MD, 4 Units at 01/31/21 2154 .  insulin aspart (novoLOG) injection 10 Units, 10 Units, Subcutaneous, TID WC, Loletha Grayer, MD, 10 Units at 02/03/21 [phone] .  insulin glargine-yfgn (SEMGLEE) injection 9 Units, 9 Units, Subcutaneous, QHS, Loletha Grayer, MD, 9 Units at 02/02/21 2059 .  lactulose (CHRONULAC) 10 GM/15ML solution 30 g, 30 g, Oral, BID PRN, Leslye Peer, Richard, MD .  levalbuterol [facility]) nebulizer solution 1.25 mg, 1.25 mg, Nebulization, Q4H PRN, Howerter, Justin B, DO, 1.25 mg at 01/17/21 2011 .  multivitamin with minerals tablet 1 tablet, 1 tablet, Oral, Daily, Kathie Dike, MD, 1 tablet at 02/03/21 0925 .  mycophenolate (CELLCEPT) capsule 500 mg, 500 mg, Oral, BID, Ottie Glazier, MD, 500 mg at 02/03/21 0926 .  polyethylene glycol (MIRALAX / GLYCOLAX) packet 17  g, 17 g, Oral, Daily, Loletha Grayer, MD, 17 g at 02/01/21 0937 .  predniSONE (DELTASONE) tablet 50 mg, 50 mg, Oral, Q breakfast, Eldene Plocher, MD, 50 mg at 02/03/21 0925 .  simethicone (MYLICON) chewable tablet 80 mg, 80 mg, Oral, QID PRN, Loletha Grayer, MD, 80 mg at 01/29/21 1411 .  sodium chloride (OCEAN) 0.65 % nasal spray 1 spray, 1 spray, Each Nare, PRN, Loletha Grayer, MD, 1 spray at 01/30/21 2142 .  sodium chloride flush (NS) 0.9 % injection 10-40 mL, 10-40 mL, Intracatheter, Q12H, Memon, Jolaine Artist, MD, 10 mL at 02/02/21 2101 .  sodium chloride flush (NS) 0.9 % injection 10-40 mL, 10-40 mL, Intracatheter, PRN, Kathie Dike, MD, 10 mL at 01/24/21 0915 .  sulfamethoxazole-trimethoprim (BACTRIM) 400-80 MG per tablet 1 tablet, 1 tablet, Oral, Q12H, Ottie Glazier, MD, 1 tablet at 02/03/21 0926 .  traZODone (DESYREL) tablet 50 mg, 50 mg, Oral, QHS PRN, Sharion Settler, NP, 50 mg at 02/01/21 2120    ALLERGIES   Penicillins     REVIEW OF SYSTEMS    Review of Systems:  Gen:  Denies  fever, sweats, chills weigh loss  HEENT: Denies blurred vision, double vision, ear pain, eye pain, hearing loss, nose bleeds, sore throat Cardiac:  No dizziness, chest pain or heaviness, chest tightness,edema Resp:   Denies cough or sputum porduction, shortness of breath,wheezing, hemoptysis,  Gi: Denies swallowing difficulty, stomach pain, nausea or vomiting, diarrhea, constipation, bowel incontinence Gu:  Denies bladder incontinence, burning urine Ext:   Denies Joint pain, stiffness or swelling Skin: Denies  skin rash, easy bruising or bleeding or hives Endoc:  Denies polyuria, polydipsia , polyphagia or weight change Psych:   Denies depression, insomnia  or hallucinations   Other:  All other systems negative   VS: BP 113/72 (BP Location: Left Arm)   Pulse 97   Temp 98.3 F (36.8 C) (Oral)   Resp 20   Ht 6' (1.829 m)   Wt 67.2 kg   SpO2 100%   BMI 20.09 kg/m      PHYSICAL  EXAM    GENERAL:NAD, no fevers, chills, no weakness no fatigue HEAD: Normocephalic, atraumatic.  EYES: Pupils equal, round, reactive to light. Extraocular muscles intact. No scleral icterus.  MOUTH: Moist mucosal membrane. Dentition intact. No abscess noted.  EAR, NOSE, THROAT: Clear without exudates. No external lesions.  NECK: Supple. No thyromegaly. No nodules. No JVD.  PULMONARY: Bilateral rhonchi improved from previous CARDIOVASCULAR: S1 and S2. Regular rate and rhythm. No murmurs, rubs, or gallops. No edema. Pedal pulses 2+ bilaterally.  GASTROINTESTINAL: Soft, nontender, nondistended. No masses. Positive bowel sounds. No hepatosplenomegaly.  MUSCULOSKELETAL: No swelling, clubbing, or edema. Range of motion full in all extremities.  NEUROLOGIC: Cranial nerves II through XII are intact. No gross focal neurological deficits. Sensation intact. Reflexes intact.  SKIN: No ulceration, lesions, rashes, or cyanosis. Skin warm and dry. Turgor intact.  PSYCHIATRIC: Mood, affect within normal limits. The patient is awake, alert and oriented x 3. Insight, judgment intact.       IMAGING    CT Angio Chest PE W/Cm &/Or Wo Cm  Result Date: 01/13/2021 CLINICAL DATA:  Respiratory distress, hypoxia EXAM: CT ANGIOGRAPHY CHEST WITH CONTRAST TECHNIQUE: Multidetector CT imaging of the chest was performed using the standard protocol during bolus administration of intravenous contrast. Multiplanar CT image reconstructions and MIPs were obtained to evaluate the vascular anatomy. CONTRAST:  32m OMNIPAQUE IOHEXOL 350 MG/ML SOLN COMPARISON:  10/26/2020, 01/13/2021 FINDINGS: Cardiovascular: This is a technically adequate evaluation of the pulmonary vasculature. No filling defects or pulmonary emboli. The heart is unremarkable without pericardial effusion. No evidence of thoracic aortic aneurysm or dissection. Right chest wall port via internal jugular approach tip within the superior vena cava. Mediastinum/Nodes:  No pathologic adenopathy within the mediastinum, hila, or axilla. Thyroid, trachea, and esophagus are unremarkable. Lungs/Pleura: There has been interval progression of the multifocal ground-glass airspace disease seen previously. Changes are most pronounced within the dependent lower lobes. No effusion or pneumothorax. The central airways are patent, with persistent bronchiectasis. Upper Abdomen: No acute abnormality. Musculoskeletal: No acute or destructive bony lesions. Reconstructed images demonstrate no additional findings. Review of the MIP images confirms the above findings. IMPRESSION: 1. No evidence of pulmonary embolus. 2. Progressive multifocal bilateral ground-glass airspace disease, greatest at the lung bases. The differential diagnosis would include progressive postinflammatory scarring and fibrosis, multifocal atypical pneumonia, or hypersensitivity/drug toxicity. 3. Stable bronchiectasis. Electronically Signed   By: MRanda NgoM.D.   On: 01/13/2021 17:13   DG Chest Port 1 View  Result Date: 02/01/2021 CLINICAL DATA:  Shortness of breath, edema EXAM: PORTABLE CHEST 1 VIEW COMPARISON:  01/30/2021 FINDINGS: Stable positioning of right IJ approach Port-A-Cath. Heart size within normal limits. Low lung volumes. Slight interval worsening of bilateral airspace opacities, left worse than right. No pleural effusion or pneumothorax. IMPRESSION: Slight interval worsening of bilateral airspace opacities, left worse than right. Electronically Signed   By: NDavina PokeD.O.   On: 02/01/2021 14:37   DG Chest Port 1 View  Result Date: 01/30/2021 CLINICAL DATA:  Acute on chronic hypoxemic respiratory failure. EXAM: PORTABLE CHEST 1 VIEW COMPARISON:  January 28, 2021 FINDINGS: No pneumothorax. Stable cardiomediastinal silhouette and  right Port-A-Cath. Left-greater-than-right pulmonary opacities/infiltrates. No other interval changes. IMPRESSION: 1. Persistent low lung volumes and bilateral pulmonary  infiltrates. No interval changes. Electronically Signed   By: Dorise Bullion III M.D.   On: 01/30/2021 08:36   DG Chest Port 1 View  Result Date: 01/28/2021 CLINICAL DATA:  Shortness of breath. EXAM: PORTABLE CHEST 1 VIEW COMPARISON:  Multiple recent chest films. FINDINGS: The right IJ power port is stable. Persistent low lung volumes and bilateral pulmonary infiltrates. No definite pleural effusion or pneumothorax. IMPRESSION: Persistent low lung volumes and bilateral pulmonary infiltrates. Electronically Signed   By: Marijo Sanes M.D.   On: 01/28/2021 16:03   DG Chest Port 1 View  Result Date: 01/26/2021 CLINICAL DATA:  Shortness of breath, abnormal chest x-ray EXAM: PORTABLE CHEST 1 VIEW COMPARISON:  01/24/2021 FINDINGS: Right Port-A-Cath remains in place, unchanged. Low lung volumes with bilateral airspace opacities, most pronounced in the left perihilar and lower lobe, unchanged since prior study. No visible effusions or pneumothorax. Heart is normal size. IMPRESSION: Low lung volumes with bilateral airspace disease, left-greater-than-right. No significant change. Electronically Signed   By: Rolm Baptise M.D.   On: 01/26/2021 09:42   DG Chest Port 1 View  Result Date: 01/24/2021 CLINICAL DATA:  Follow-up multifocal pneumonia. EXAM: PORTABLE CHEST 1 VIEW COMPARISON:  01/22/2021 and older exams. FINDINGS: Mild interval decrease in the airspace lung opacities, most evident on the left. Lung volumes remain low. No new lung abnormalities. No visualized pleural effusion.  No pneumothorax. Right internal jugular Port-A-Cath is stable. IMPRESSION: 1. Mild interval improvement in lung aeration with decreased airspace lung opacities, most notably on the left. Electronically Signed   By: Lajean Manes M.D.   On: 01/24/2021 07:57   DG Chest Port 1 View  Result Date: 01/22/2021 CLINICAL DATA:  Hypoxia, shortness of breath. EXAM: PORTABLE CHEST 1 VIEW COMPARISON:  January 20, 2021. FINDINGS: Stable  cardiomediastinal silhouette. Right internal jugular Port-A-Cath is unchanged in position. Hypoinflation of the lungs is noted. Stable bilateral lung opacities are noted, left greater than right, most consistent with multifocal pneumonia. Bony thorax is unremarkable. IMPRESSION: Hypoinflation of the lungs. Stable bilateral lung opacities are noted, left greater than right, most consistent with multifocal pneumonia. Electronically Signed   By: Marijo Conception M.D.   On: 01/22/2021 19:26   DG Chest Port 1 View  Result Date: 01/20/2021 CLINICAL DATA:  Pneumonitis EXAM: PORTABLE CHEST 1 VIEW COMPARISON:  01/19/2021 FINDINGS: Shallow inspiration. Patchy infiltrates in the left lung and right lung base, similar to prior study and likely multifocal pneumonia. No pleural effusions. No pneumothorax. Mediastinal contours appear intact. Heart size is normal. Power port type central venous catheter on the right with tip over the cavoatrial junction region. Surgical clips in the right upper quadrant. IMPRESSION: Shallow inspiration. Patchy infiltrates in both lungs, likely multifocal pneumonia, without change. Electronically Signed   By: Lucienne Capers M.D.   On: 01/20/2021 03:56   DG Chest Port 1 View  Result Date: 01/19/2021 CLINICAL DATA:  Abnormality on previous chest x-ray in a 60 year old male. EXAM: PORTABLE CHEST 1 VIEW COMPARISON:  CT from January 13, 2021, chest x-ray January 18, 2021. FINDINGS: RIGHT-sided Port-A-Cath terminates at the caval to atrial junction. Trachea is midline. Cardiomediastinal contours are stable. Interstitial and airspace opacities throughout the chest worse in the LEFT mid to upper chest, RIGHT upper chest lung bases bilaterally with similar appearance. On limited assessment there is no acute skeletal process. EKG leads project over the chest.  IMPRESSION: No interval change in the appearance of the chest since previous imaging with continued decreased lung volumes and patchy  bilateral opacities. Electronically Signed   By: Zetta Bills M.D.   On: 01/19/2021 07:59   DG Chest Port 1 View  Result Date: 01/18/2021 CLINICAL DATA:  Respiratory distress EXAM: PORTABLE CHEST 1 VIEW COMPARISON:  01/17/2021 FINDINGS: Cardiac shadow is stable. Right-sided chest wall port is again seen. Lungs are hypoinflated. Patchy airspace opacities again identified in both lungs left greater than right but stable from the prior exam. IMPRESSION: Patchy airspace opacities bilaterally stable from the prior exam. Electronically Signed   By: Inez Catalina M.D.   On: 01/18/2021 17:24   DG Chest Port 1 View  Result Date: 01/17/2021 CLINICAL DATA:  Worsening shortness of breath. EXAM: PORTABLE CHEST 1 VIEW COMPARISON:  01/14/2021 FINDINGS: 0826 hours. Low volume film. Patchy airspace disease again noted in both lungs, left greater than right. Cardiopericardial silhouette is at upper limits of normal for size. Right Port-A-Cath again noted. Telemetry leads overlie the chest. IMPRESSION: Low volume film with patchy bilateral airspace disease, left greater than right. No substantial change. Electronically Signed   By: Misty Stanley M.D.   On: 01/17/2021 08:53   DG Chest Port 1 View  Result Date: 01/14/2021 CLINICAL DATA:  Shortness of breath.  History of cancer.  Diabetes. EXAM: PORTABLE CHEST 1 VIEW.  Patient is rotated. COMPARISON:  Chest x-ray 01/13/2021, CT chest 01/13/2021 FINDINGS: Accessed right chest wall Port-A-Cath in stable position. The heart and mediastinal contours are within normal limits. Low lung volumes with persistent diffuse patchy airspace opacities. No pleural effusion. No pneumothorax. No acute osseous abnormality. Right upper quadrant surgical clips. IMPRESSION: Low lung volumes with persistent diffuse patchy airspace opacities. Electronically Signed   By: Iven Finn M.D.   On: 01/14/2021 21:08   DG Chest Port 1 View  Result Date: 01/13/2021 CLINICAL DATA:  Shortness of  breath EXAM: PORTABLE CHEST 1 VIEW COMPARISON:  Chest radiograph 07/29/2019 FINDINGS: There is a right chest wall port with the tip terminating in the lower SVC/cavoatrial junction. The cardiomediastinal silhouette is within normal limits. Lung volumes are low with unchanged asymmetric elevation of the right hemidiaphragm. There are increased interstitial markings with diffuse reticular opacities throughout both lungs. There is no focal consolidation. There is no significant pleural effusion. There is no pneumothorax. There is no acute osseous abnormality. There is gaseous distention of the stomach and large bowel in the right upper quadrant. IMPRESSION: Low lung volumes with suspected mild pulmonary interstitial edema. Atypical/viral infection could have a similar appearance. Electronically Signed   By: Valetta Mole M.D.   On: 01/13/2021 14:24           ASSESSMENT/PLAN   Acute hypoxemic respiratory failure Due to acute exacerbation of interstitial lung disease  - present on admission  - COVID19 negative   - supplemental O2 during my evaluation 10l/min>>8L>>HFNC  -Respiratory viral panel-negative  -serum fungitell-negative  -legionella ab-negative  -nasal MRSA PCR-negative -Procalcitonin trend reviewed -strep pneumoniae ur AG -Histoplasma Ur Ag-negative -sputum resp cultures -AFB sputum expectorated specimen -sputum cytology  -completed  Cefepime course for cap -Reduced Solumedrol to 40 BID - 01/15/21>>50 BID>>40 BID>>30 BID>>20 BID>>pred 50 -reviewed pertinent imaging with patient today - ESR and CRP are elevated but improving -PT/OT for d/c planning  -please encourage patient to use incentive spirometer few times each hour while hospitalized.   -continue Cellcept 250 bid >>500 bid  -dcd lasix 01/28/21 -UOP is  adequate -Discussed goal of care - recommend DNR patient is in agreement but still discussing with wife, palliative care service to follow  -lasix 20 daily PO     Thank  you for allowing me to participate in the care of this patient.   Patient/Family are satisfied with care plan and all questions have been answered.   This document was prepared using Dragon voice recognition software and may include unintentional dictation errors.     Ottie Glazier, M.D.  Division of [place]

## 2021-02-04 DIAGNOSIS — C8290 Follicular lymphoma, unspecified, unspecified site: Secondary | ICD-10-CM | POA: Diagnosis not present

## 2021-02-04 DIAGNOSIS — J9621 Acute and chronic respiratory failure with hypoxia: Secondary | ICD-10-CM | POA: Diagnosis not present

## 2021-02-04 DIAGNOSIS — J849 Interstitial pulmonary disease, unspecified: Secondary | ICD-10-CM | POA: Diagnosis not present

## 2021-02-04 LAB — GLUCOSE, CAPILLARY
Glucose-Capillary: 145 mg/dL — ABNORMAL HIGH (ref 70–99)
Glucose-Capillary: 98 mg/dL (ref 70–99)
Glucose-Capillary: 251 mg/dL — ABNORMAL HIGH (ref 70–99)
Glucose-Capillary: 112 mg/dL — ABNORMAL HIGH (ref 70–99)

## 2021-02-04 LAB — BASIC METABOLIC PANEL
Anion gap: 5 (ref 5–15)
BUN: 17 mg/dL (ref 8–23)
CO2: 34 mmol/L — ABNORMAL HIGH (ref 22–32)
Calcium: 7.8 mg/dL — ABNORMAL LOW (ref 8.9–10.3)
Chloride: 95 mmol/L — ABNORMAL LOW (ref 98–111)
Creatinine, Ser: 0.5 mg/dL — ABNORMAL LOW (ref 0.61–1.24)
GFR, Estimated: >60 mL/min (ref >60)
Glucose, Bld: 146 mg/dL — ABNORMAL HIGH (ref 70–99)
Potassium: 3.9 mmol/L (ref 3.5–5.1)
Sodium: 134 mmol/L — ABNORMAL LOW (ref 135–145)

## 2021-02-04 LAB — BRAIN NATRIURETIC PEPTIDE: B Natriuretic Peptide: 45.9 pg/mL (ref 0.0–100.0)

## 2021-02-04 LAB — SEDIMENTATION RATE: Sed Rate: 58 mm/hr — ABNORMAL HIGH (ref 0–20)

## 2021-02-04 LAB — MAGNESIUM: Magnesium: 1.9 mg/dL (ref 1.7–2.4)

## 2021-02-04 LAB — C-REACTIVE PROTEIN: CRP: 2.9 mg/dL — ABNORMAL HIGH (ref <1.0)

## 2021-02-04 MED ORDER — MYCOPHENOLATE MOFETIL 250 MG PO CAPS
750.0000 mg | ORAL_CAPSULE | Freq: Two times a day (BID) | ORAL | Status: DC
  Administered 2021-02-04 – 2021-02-19 (×30): 750 mg via ORAL
  Filled 2021-02-04 (×31): qty 3

## 2021-02-04 NOTE — Progress Notes (Signed)
Pulmonary Medicine          Date: 02/04/2021,   MRN# [id number] Edward Atkinson 1960/07/07     AdmissionWeight: 63.5 kg                 CurrentWeight: 68.6 kg   Referring physician: Dr Roderic Palau   CHIEF COMPLAINT:   Acute on chronic hypoxemic respiratory failure   HISTORY OF PRESENT ILLNESS   60 yo M w/hx of chronic hypoxemia and ILD post COVID19, T2DM, PE on eliquis, Lymphoma s/p Rituxan which is completed.  Came in due to worsening SOB and DOE specifically mild exertional defacation/urination with feelings of presyncope and severe dyspnea which started to get worse post tapering from steroid appx few days prior to onset of symptom progression.  He was placed on solumedrol and reported mild improvement.  He is on 10L /min [place] during my evaluation.  He had CT chest done with PE protocol and noted to have bilateral GGO infiltrates worse compared to June study. PCCM consultation for further evaluation and management.     01/17/21-  patient seems to be slighly clinically worse post reduction of IV steroids yesterday indicative of ongoing ILD exacerbation, thus far infectious workup is negative but inflammatory biomarkers are highly elevated also pointing towards ILD exacerbation.  There is new CXR in process this morning for interval changes. We discussed increasing steroids slightly and giving more time.  I will also initiate steroid sparing immunomodulation with BID cellcept as patient will likely need therapy for prolonged time period.  Compensatory tachycardia noted appreciate cardiology team.  Sugars with peaks and valleys with adjusted steroid therapy.  Patient with muscle wasting and protein calorie malnutrition but eating better this am.  01/18/21- patient is on HFNC at 60%, feels better, has not been able to tolerate BIPAP.  Wife is at bedside.  BP is borderline low MAP 76.  He has negative infectious workup and thus far ILD exacerbation seems to be main differential. He  continues to have peaks and valleys in blood glucose.  We started steroid sparing regimen with cellcept bid 250 mg and will increase dose as we taper off prednisone.  Remains tachyarrtythmic but improved. Appreciate everyone involved.     01/19/21- patient seen and examined at bedside.  Wife present during evaluation.  Ow HFNC weaned to 41%/45L/MIN.  CRP is still up, have increased cellcept and reducing solumedrol.  Patient felt stronger physically and was able to get OOB to chair. Cough is less now.  He is non-labored with breathing no accessory mm use today. He had dark stools with reduced hb, FOBT ordred.  Haptoglobin is elevated. Pathology slide review ordered.   01/20/21- patient is essentially unchanged from yesterday from pulmonary perspective. CXR also unchanged,  bloodwork with reduction in CRP.  Plan to wean O2 and continue OOB with PT/OT,  Continue current steroid dose and Cellcept.   01/21/21- patient is stable on HFNC.  He had small set back with increased O2 req after exerting himself in bathroom.  HFNC from 41/45 to 54/45.   He diuresed very well with lasix 20 bid >2L urine in past 24h.  Tachycardia is improved. He does not feel weaker and infact states he feels slowly improved.   01/22/21- I spoke to patient regarding goals of care and recommend DNR.  He is agreeable but I asked him to discuss with wife and family out of respect and we will revisit tommorow.  He states he is feeling better  and CRP is trending down but he remains on HFNC 60/40.  He was able to get OOB today and walked to bathroom but experienced mild desaturation with quick recovery "not much issues".      01/23/21- patient is still on high flow nasal canula , he is on 56%/45L.  We reviewed code status and he is still in discussion with family regarding DNR.   We discussed lung transplant per family request and this may be a future option if he is strong enough and recovers.  I have increased insluin TID to 20 from 15 and  increased bactrim to bid SS   01/24/21- patient weaned on HFNC to 49/45, he was able to use BIPAP overnight thinks its helping.  CXR this AM with interval improvement. Will keep on current dose of meds.  Repeat CRP in am.  Last BM yesterday well formed no blood, eating good, urine output very good on 20 bid lasix. Getting OOB with  transient desaturation.  Overall slight improvement.    01/25/21- patient has weaned down to 35%/40L/min.  He remains full code. Reviewed medical plan with Dr Jimmye Norman today.  Reduced steroids from 50 BID to 40 BID and also reduced insulin slightly.    01/26/21- patient is further weaned to 15L humidified HF nasal canula.  He did PT ROM exercises with mild desaturation. CXR today with expiratory image unable to see much interval changes. Patient reports improvement clinically. Plan to continue current regimen. Sugars slightly high due to steroids, will wean steroids soon. Continue cellcept as current 500 bid  01/27/21- patient is relatively same as yesterday, his O2 is weaned down to 14L.  He remains on IV solumedrol at 50 bid, we did discuss weaning this further to 40 BID. Will keep cellcept at current dose 500 bid.  His ankles have been down in seated position and are edematous on examination. He continues to participate with PT and feels good subjectively. We discussed starting dose of lasix for pedal edema.    01/28/21- patient examined at bedside, s/p PT/OT session with slight dyspnea.  He was weaned to 10L O2 but then went back up to 13 post PT due to increased WOB.  He has complete resolution of LE edema and appears to be euvolemic at this point. We discussed reducing soluemdrol today to 20 bid.  I reviewed plan with Dr Leslye Peer today.  01/29/21- patient examined at bedside.  He had a good day and feels better. We reviewed medical plan and are reducing steroids to PO prednisone starting at 1m daily.   01/30/21-  Patient seen and examined at bedside.  CXR this am with  worsening pulmonary edema, included below.  Vitals stable with mild tachypnea and tachycardia with activity. Will deliver 1 dose IV lasix 20 today. Plan to continue Pred PO and cellcept at 500 bid. Pulmonary intensive PT with IS and will initiate Metaneb therapy with albuterol. Blood work today will be repeated CRP CMP phos procal ESR.  Patient is agreeable to BIPAP QHS and Metaneb BID.   01/31/21-  patient had set back yesterday with mild flash pulm edema on CXR. He diuresed very well overnight and feels back to where he was at previously.  He had transient reactive leukocytosis as well.  We initiated empirically zosyn and zithromax on top of bid bactrim.  This probably could be dcd since he recovered so quickly which would not occur with pneumonia.  Ill go head and dc this today but continue PO bactrim.  Will stay on pred 50 today with cellcept at 500 bid as previous.  His O2 has been reduced from 95 to 70% overnight and hes working with PT today. Hopefully we can wean this back down to 10L as he was previosly then back to his home setting.   02/01/21- patient is somewhat better today, he is being weaned on O2 and is now down to 40%. He had BM today. Leaving prednisone at current dose and cxr today.   02/02/21- patient is now on 15L HF bubbler.  He reports feeling better.  He had 3 bowel movements after stool softner. He is working with PT/OT.  His long term prognosis is poor unfortunately and we appreciate collaboration with Palliative care team.   02/03/21- patient had over exerted himself again and required increased O2. He states he feels better and just did more then he should have with PT.  His wbc count is trending down. His CRP is trending down.  I have started morhpine for air hunger and pain at sacrum for skin breakdown and pressure ulceration.     02/04/21- patient is unchanged.  He wants to keep fighting to improve.  CRP is trending down again.  ESR is upward trending. He was unable to perform  adequately with PT and had severe dyspnea and desaturation just to get OOB to chair. Discussed case with [employer] to see if he can have HFNC at home but the device has been recalled. Discussed patient with Dr Roosevelt Locks plan is for additional diuresis.  I have increased cellcept to 750 BID today    PAST MEDICAL HISTORY   Past Medical History:  Diagnosis Date  . Chronic respiratory failure with hypoxia (HCC)    2L [place] continuous  . COVID-19   . Diabetes mellitus without complication ([place])   . Diverticulitis   . Follicular lymphoma of intra-abdominal lymph nodes ([place]) 02/15/2019  . ILD (interstitial lung disease) ([place])      SURGICAL HISTORY   Past Surgical History:  Procedure Laterality Date  . CHOLECYSTECTOMY    . COLON RESECTION     DUE TO DIVERTICULITIS  . FLEXIBLE BRONCHOSCOPY N/A 12/02/2019   Procedure: FLEXIBLE BRONCHOSCOPY;  Surgeon: Ottie Glazier, MD;  Location: [facility];  Service: Thoracic;  Laterality: N/A;  . PORTA CATH INSERTION N/A 03/11/2019   Procedure: PORTA CATH INSERTION;  Surgeon: Algernon Huxley, MD;  Location: [place] [facility];  Service: Cardiovascular;  Laterality: N/A;  . PULMONARY THROMBECTOMY N/A 01/29/2019   Procedure: PULMONARY THROMBECTOMY;  Surgeon: Algernon Huxley, MD;  Location: [place] [facility];  Service: Cardiovascular;  Laterality: N/A;     FAMILY HISTORY   Family History  Problem Relation Age of Onset  . Diabetes Brother      SOCIAL HISTORY   Social History   Tobacco Use  . Smoking status: Never  . Smokeless tobacco: Never  Vaping Use  . Vaping Use: Never used  Substance Use Topics  . Alcohol use: Yes    Alcohol/week: 0.0 - 2.0 standard drinks    Comment: occasional  . Drug use: Never     MEDICATIONS    Home Medication:    Current Medication:  Current Facility-Administered Medications:  .  acetaminophen (TYLENOL) tablet 650 mg, 650 mg, Oral, Q6H PRN, 650 mg at 01/18/21 1411 **OR** acetaminophen (TYLENOL) suppository 650 mg,  650 mg, Rectal, Q6H PRN, Howerter, Justin B, DO .  ALPRAZolam (XANAX) tablet 0.25 mg, 0.25 mg, Oral, QHS PRN, Sharen Hones, MD .  ALPRAZolam (XANAX) tablet 0.25 mg, 0.25 mg, Oral, q morning, Sharen Hones, MD .  apixaban Arne Cleveland) tablet 5 mg, 5 mg, Oral, BID, Beers, Shanon Brow, RPH, 5 mg at 02/03/21 2137 .  Chlorhexidine Gluconate Cloth 2 % PADS 6 each, 6 each, Topical, Daily, Kathie Dike, MD, 6 each at 02/03/21 1347 .  digoxin (LANOXIN) tablet 0.125 mg, 0.125 mg, Oral, Daily, Memon, Jolaine Artist, MD, 0.125 mg at 02/03/21 0926 .  diltiazem (CARDIZEM CD) 24 hr capsule 180 mg, 180 mg, Oral, Daily, Paraschos, Alexander, MD, 180 mg at 02/03/21 0925 .  diltiazem (CARDIZEM) injection 10 mg, 10 mg, Intravenous, Q6H PRN, Wyvonnia Dusky, MD, 10 mg at 01/24/21 2328 .  docusate sodium (COLACE) capsule 200 mg, 200 mg, Oral, BID, Wyvonnia Dusky, MD, 200 mg at 02/03/21 9179 .  feeding supplement (NEPRO CARB STEADY) liquid 237 mL, 237 mL, Oral, TID BM, Memon, Jehanzeb, MD, Last Rate: 0 mL/hr at 01/21/21 2130, 237 mL at 02/03/21 1347 .  furosemide (LASIX) injection 20 mg, 20 mg, Intravenous, Daily, Ottie Glazier, MD, 20 mg at 02/03/21 0925 .  guaiFENesin (MUCINEX) 12 hr tablet 1,200 mg, 1,200 mg, Oral, BID, Kathie Dike, MD, 1,200 mg at 02/03/21 2137 .  guaiFENesin-dextromethorphan (ROBITUSSIN DM) 100-10 MG/5ML syrup 5 mL, 5 mL, Oral, Q4H PRN, Kathie Dike, MD, 5 mL at 02/01/21 0937 .  insulin aspart (novoLOG) injection 0-20 Units, 0-20 Units, Subcutaneous, TID WC, Kathie Dike, MD, 3 Units at 02/03/21 1739 .  insulin aspart (novoLOG) injection 0-5 Units, 0-5 Units, Subcutaneous, QHS, Kathie Dike, MD, 4 Units at 01/31/21 2154 .  insulin aspart (novoLOG) injection 10 Units, 10 Units, Subcutaneous, TID WC, Loletha Grayer, MD, 10 Units at 02/03/21 1739 .  insulin glargine-yfgn (SEMGLEE) injection 9 Units, 9 Units, Subcutaneous, QHS, Loletha Grayer, MD, 9 Units at 02/03/21 2137 .  lactulose  (CHRONULAC) 10 GM/15ML solution 30 g, 30 g, Oral, BID PRN, Leslye Peer, Richard, MD .  levalbuterol [facility]) nebulizer solution 1.25 mg, 1.25 mg, Nebulization, Q4H PRN, Howerter, Justin B, DO, 1.25 mg at 01/17/21 2011 .  morphine 2 MG/ML injection 1 mg, 1 mg, Intravenous, Q3H PRN, Ottie Glazier, MD .  multivitamin with minerals tablet 1 tablet, 1 tablet, Oral, Daily, Kathie Dike, MD, 1 tablet at 02/03/21 0925 .  mycophenolate (CELLCEPT) capsule 500 mg, 500 mg, Oral, BID, Lanney Gins, Michale Weikel, MD, 500 mg at 02/03/21 2137 .  polyethylene glycol (MIRALAX / GLYCOLAX) packet 17 g, 17 g, Oral, Daily, Loletha Grayer, MD, 17 g at 02/01/21 0937 .  predniSONE (DELTASONE) tablet 50 mg, 50 mg, Oral, Q breakfast, Antionetta Ator, MD, 50 mg at 02/03/21 0925 .  simethicone (MYLICON) chewable tablet 80 mg, 80 mg, Oral, QID PRN, Loletha Grayer, MD, 80 mg at 01/29/21 1411 .  sodium chloride (OCEAN) 0.65 % nasal spray 1 spray, 1 spray, Each Nare, PRN, Loletha Grayer, MD, 1 spray at 01/30/21 2142 .  sodium chloride flush (NS) 0.9 % injection 10-40 mL, 10-40 mL, Intracatheter, Q12H, Memon, Jehanzeb, MD, 10 mL at 02/03/21 2200 .  sodium chloride flush (NS) 0.9 % injection 10-40 mL, 10-40 mL, Intracatheter, PRN, Kathie Dike, MD, 10 mL at 01/24/21 0915 .  sulfamethoxazole-trimethoprim (BACTRIM) 400-80 MG per tablet 1 tablet, 1 tablet, Oral, Q12H, Ottie Glazier, MD, 1 tablet at 02/03/21 2137 .  traZODone (DESYREL) tablet 50 mg, 50 mg, Oral, QHS PRN, Sharion Settler, NP, 50 mg at 02/03/21 2137    ALLERGIES   Penicillins     REVIEW OF SYSTEMS  Review of Systems:  Gen:  Denies  fever, sweats, chills weigh loss  HEENT: Denies blurred vision, double vision, ear pain, eye pain, hearing loss, nose bleeds, sore throat Cardiac:  No dizziness, chest pain or heaviness, chest tightness,edema Resp:  admits to severe dyspnea Gi: Denies swallowing difficulty, stomach pain, nausea or vomiting, diarrhea,  constipation, bowel incontinence Gu:  Denies bladder incontinence, burning urine Ext:   Denies Joint pain, stiffness or swelling Skin: Denies  skin rash, easy bruising or bleeding or hives Endoc:  Denies polyuria, polydipsia , polyphagia or weight change Psych:   admits to axniety  Other:  All other systems negative   VS: BP 110/65 (BP Location: Right Arm)   Pulse 80   Temp 97.6 F (36.4 C) (Oral)   Resp 20   Ht 6' (1.829 m)   Wt 68.6 kg   SpO2 92%   BMI 20.51 kg/m      PHYSICAL EXAM    GENERAL:NAD, no fevers, chills, no weakness no fatigue HEAD: Normocephalic, atraumatic.  EYES: Pupils equal, round, reactive to light. Extraocular muscles intact. No scleral icterus.  MOUTH: Moist mucosal membrane. Dentition intact. No abscess noted.  EAR, NOSE, THROAT: Clear without exudates. No external lesions.  NECK: Supple. No thyromegaly. No nodules. No JVD.  PULMONARY: Bilateral rhonchi improved from previous CARDIOVASCULAR: S1 and S2. Regular rate and rhythm. No murmurs, rubs, or gallops. No edema. Pedal pulses 2+ bilaterally.  GASTROINTESTINAL: Soft, nontender, nondistended. No masses. Positive bowel sounds. No hepatosplenomegaly.  MUSCULOSKELETAL: No swelling, clubbing, or edema. Range of motion full in all extremities.  NEUROLOGIC: Cranial nerves II through XII are intact. No gross focal neurological deficits. Sensation intact. Reflexes intact.  SKIN: No ulceration, lesions, rashes, or cyanosis. Skin warm and dry. Turgor intact.  PSYCHIATRIC: Mood, affect within normal limits. The patient is awake, alert and oriented x 3. Insight, judgment intact.       IMAGING    CT Angio Chest PE W/Cm &/Or Wo Cm  Result Date: 01/13/2021 CLINICAL DATA:  Respiratory distress, hypoxia EXAM: CT ANGIOGRAPHY CHEST WITH CONTRAST TECHNIQUE: Multidetector CT imaging of the chest was performed using the standard protocol during bolus administration of intravenous contrast. Multiplanar CT image  reconstructions and MIPs were obtained to evaluate the vascular anatomy. CONTRAST:  84m OMNIPAQUE IOHEXOL 350 MG/ML SOLN COMPARISON:  10/26/2020, 01/13/2021 FINDINGS: Cardiovascular: This is a technically adequate evaluation of the pulmonary vasculature. No filling defects or pulmonary emboli. The heart is unremarkable without pericardial effusion. No evidence of thoracic aortic aneurysm or dissection. Right chest wall port via internal jugular approach tip within the superior vena cava. Mediastinum/Nodes: No pathologic adenopathy within the mediastinum, hila, or axilla. Thyroid, trachea, and esophagus are unremarkable. Lungs/Pleura: There has been interval progression of the multifocal ground-glass airspace disease seen previously. Changes are most pronounced within the dependent lower lobes. No effusion or pneumothorax. The central airways are patent, with persistent bronchiectasis. Upper Abdomen: No acute abnormality. Musculoskeletal: No acute or destructive bony lesions. Reconstructed images demonstrate no additional findings. Review of the MIP images confirms the above findings. IMPRESSION: 1. No evidence of pulmonary embolus. 2. Progressive multifocal bilateral ground-glass airspace disease, greatest at the lung bases. The differential diagnosis would include progressive postinflammatory scarring and fibrosis, multifocal atypical pneumonia, or hypersensitivity/drug toxicity. 3. Stable bronchiectasis. Electronically Signed   By: MRanda NgoM.D.   On: 01/13/2021 17:13   DG Chest Port 1 View  Result Date: 02/01/2021 CLINICAL DATA:  Shortness of breath, edema EXAM: PORTABLE CHEST 1  VIEW COMPARISON:  01/30/2021 FINDINGS: Stable positioning of right IJ approach Port-A-Cath. Heart size within normal limits. Low lung volumes. Slight interval worsening of bilateral airspace opacities, left worse than right. No pleural effusion or pneumothorax. IMPRESSION: Slight interval worsening of bilateral airspace  opacities, left worse than right. Electronically Signed   By: Davina Poke D.O.   On: 02/01/2021 14:37   DG Chest Port 1 View  Result Date: 01/30/2021 CLINICAL DATA:  Acute on chronic hypoxemic respiratory failure. EXAM: PORTABLE CHEST 1 VIEW COMPARISON:  January 28, 2021 FINDINGS: No pneumothorax. Stable cardiomediastinal silhouette and right Port-A-Cath. Left-greater-than-right pulmonary opacities/infiltrates. No other interval changes. IMPRESSION: 1. Persistent low lung volumes and bilateral pulmonary infiltrates. No interval changes. Electronically Signed   By: Dorise Bullion III M.D.   On: 01/30/2021 08:36   DG Chest Port 1 View  Result Date: 01/28/2021 CLINICAL DATA:  Shortness of breath. EXAM: PORTABLE CHEST 1 VIEW COMPARISON:  Multiple recent chest films. FINDINGS: The right IJ power port is stable. Persistent low lung volumes and bilateral pulmonary infiltrates. No definite pleural effusion or pneumothorax. IMPRESSION: Persistent low lung volumes and bilateral pulmonary infiltrates. Electronically Signed   By: Marijo Sanes M.D.   On: 01/28/2021 16:03   DG Chest Port 1 View  Result Date: 01/26/2021 CLINICAL DATA:  Shortness of breath, abnormal chest x-ray EXAM: PORTABLE CHEST 1 VIEW COMPARISON:  01/24/2021 FINDINGS: Right Port-A-Cath remains in place, unchanged. Low lung volumes with bilateral airspace opacities, most pronounced in the left perihilar and lower lobe, unchanged since prior study. No visible effusions or pneumothorax. Heart is normal size. IMPRESSION: Low lung volumes with bilateral airspace disease, left-greater-than-right. No significant change. Electronically Signed   By: Rolm Baptise M.D.   On: 01/26/2021 09:42   DG Chest Port 1 View  Result Date: 01/24/2021 CLINICAL DATA:  Follow-up multifocal pneumonia. EXAM: PORTABLE CHEST 1 VIEW COMPARISON:  01/22/2021 and older exams. FINDINGS: Mild interval decrease in the airspace lung opacities, most evident on the left. Lung  volumes remain low. No new lung abnormalities. No visualized pleural effusion.  No pneumothorax. Right internal jugular Port-A-Cath is stable. IMPRESSION: 1. Mild interval improvement in lung aeration with decreased airspace lung opacities, most notably on the left. Electronically Signed   By: Lajean Manes M.D.   On: 01/24/2021 07:57   DG Chest Port 1 View  Result Date: 01/22/2021 CLINICAL DATA:  Hypoxia, shortness of breath. EXAM: PORTABLE CHEST 1 VIEW COMPARISON:  January 20, 2021. FINDINGS: Stable cardiomediastinal silhouette. Right internal jugular Port-A-Cath is unchanged in position. Hypoinflation of the lungs is noted. Stable bilateral lung opacities are noted, left greater than right, most consistent with multifocal pneumonia. Bony thorax is unremarkable. IMPRESSION: Hypoinflation of the lungs. Stable bilateral lung opacities are noted, left greater than right, most consistent with multifocal pneumonia. Electronically Signed   By: Marijo Conception M.D.   On: 01/22/2021 19:26   DG Chest Port 1 View  Result Date: 01/20/2021 CLINICAL DATA:  Pneumonitis EXAM: PORTABLE CHEST 1 VIEW COMPARISON:  01/19/2021 FINDINGS: Shallow inspiration. Patchy infiltrates in the left lung and right lung base, similar to prior study and likely multifocal pneumonia. No pleural effusions. No pneumothorax. Mediastinal contours appear intact. Heart size is normal. Power port type central venous catheter on the right with tip over the cavoatrial junction region. Surgical clips in the right upper quadrant. IMPRESSION: Shallow inspiration. Patchy infiltrates in both lungs, likely multifocal pneumonia, without change. Electronically Signed   By: Oren Beckmann.D.  On: 01/20/2021 03:56   DG Chest Port 1 View  Result Date: 01/19/2021 CLINICAL DATA:  Abnormality on previous chest x-ray in a 60 year old male. EXAM: PORTABLE CHEST 1 VIEW COMPARISON:  CT from January 13, 2021, chest x-ray January 18, 2021. FINDINGS:  RIGHT-sided Port-A-Cath terminates at the caval to atrial junction. Trachea is midline. Cardiomediastinal contours are stable. Interstitial and airspace opacities throughout the chest worse in the LEFT mid to upper chest, RIGHT upper chest lung bases bilaterally with similar appearance. On limited assessment there is no acute skeletal process. EKG leads project over the chest. IMPRESSION: No interval change in the appearance of the chest since previous imaging with continued decreased lung volumes and patchy bilateral opacities. Electronically Signed   By: Zetta Bills M.D.   On: 01/19/2021 07:59   DG Chest Port 1 View  Result Date: 01/18/2021 CLINICAL DATA:  Respiratory distress EXAM: PORTABLE CHEST 1 VIEW COMPARISON:  01/17/2021 FINDINGS: Cardiac shadow is stable. Right-sided chest wall port is again seen. Lungs are hypoinflated. Patchy airspace opacities again identified in both lungs left greater than right but stable from the prior exam. IMPRESSION: Patchy airspace opacities bilaterally stable from the prior exam. Electronically Signed   By: Inez Catalina M.D.   On: 01/18/2021 17:24   DG Chest Port 1 View  Result Date: 01/17/2021 CLINICAL DATA:  Worsening shortness of breath. EXAM: PORTABLE CHEST 1 VIEW COMPARISON:  01/14/2021 FINDINGS: 0826 hours. Low volume film. Patchy airspace disease again noted in both lungs, left greater than right. Cardiopericardial silhouette is at upper limits of normal for size. Right Port-A-Cath again noted. Telemetry leads overlie the chest. IMPRESSION: Low volume film with patchy bilateral airspace disease, left greater than right. No substantial change. Electronically Signed   By: Misty Stanley M.D.   On: 01/17/2021 08:53   DG Chest Port 1 View  Result Date: 01/14/2021 CLINICAL DATA:  Shortness of breath.  History of cancer.  Diabetes. EXAM: PORTABLE CHEST 1 VIEW.  Patient is rotated. COMPARISON:  Chest x-ray 01/13/2021, CT chest 01/13/2021 FINDINGS: Accessed right  chest wall Port-A-Cath in stable position. The heart and mediastinal contours are within normal limits. Low lung volumes with persistent diffuse patchy airspace opacities. No pleural effusion. No pneumothorax. No acute osseous abnormality. Right upper quadrant surgical clips. IMPRESSION: Low lung volumes with persistent diffuse patchy airspace opacities. Electronically Signed   By: Iven Finn M.D.   On: 01/14/2021 21:08   DG Chest Port 1 View  Result Date: 01/13/2021 CLINICAL DATA:  Shortness of breath EXAM: PORTABLE CHEST 1 VIEW COMPARISON:  Chest radiograph 07/29/2019 FINDINGS: There is a right chest wall port with the tip terminating in the lower SVC/cavoatrial junction. The cardiomediastinal silhouette is within normal limits. Lung volumes are low with unchanged asymmetric elevation of the right hemidiaphragm. There are increased interstitial markings with diffuse reticular opacities throughout both lungs. There is no focal consolidation. There is no significant pleural effusion. There is no pneumothorax. There is no acute osseous abnormality. There is gaseous distention of the stomach and large bowel in the right upper quadrant. IMPRESSION: Low lung volumes with suspected mild pulmonary interstitial edema. Atypical/viral infection could have a similar appearance. Electronically Signed   By: Valetta Mole M.D.   On: 01/13/2021 14:24           ASSESSMENT/PLAN   Acute hypoxemic respiratory failure Due to acute exacerbation of interstitial lung disease  - present on admission  - COVID19 negative   - supplemental O2 during my evaluation  10l/min>>8L>>HFNC  -Respiratory viral panel-negative  -serum fungitell-negative  -legionella ab-negative  -nasal MRSA PCR-negative -Procalcitonin trend reviewed -strep pneumoniae ur AG -Histoplasma Ur Ag-negative -sputum resp cultures -AFB sputum expectorated specimen -sputum cytology  -completed  Cefepime course for cap -Reduced Solumedrol to 40  BID - 01/15/21>>50 BID>>40 BID>>30 BID>>20 BID>>pred 50 -reviewed pertinent imaging with patient today - ESR and CRP are elevated but improving -PT/OT for d/c planning  -please encourage patient to use incentive spirometer few times each hour while hospitalized.   -continue Cellcept 250 bid >>500 bid >>750BID -UOP is adequate -Discussed goal of care - recommend DNR patient is in agreement but still discussing with wife, palliative care service to follow  -lasix 20 daily PO     Thank you for allowing me to participate in the care of this patient.   Patient/Family are satisfied with care plan and all questions have been answered.   This document was prepared using Dragon voice recognition software and may include unintentional dictation errors.     Ottie Glazier, M.D.  Division of [facility]

## 2021-02-04 NOTE — Plan of Care (Addendum)
Went to see patient for follow up as he has been returned to heated high flow. He has visitors present with him. Will reattempt at another time.

## 2021-02-04 NOTE — Progress Notes (Signed)
PROGRESS NOTE    Edward Atkinson  VZD:[id number] DOB: Sep 30, 1960 DOA: 01/13/2021 PCP: Rusty Aus, MD    Brief Narrative:  60 year old man with history of follicular lymphoma, IRJJO-[id number] infection, interstitial lung disease and chronic respiratory failure, and type 2 diabetes mellitus.  Patient was admitted 20 days ago with acute hypoxic respiratory failure and interstitial lung disease exacerbation.  Patient had a CT scan of the chest that did not show any pulmonary embolism.  The patient was on heated high flow nasal cannula during the course and then was tapered over to bubble high flow nasal cannula.  Patient condition appears to be fluctuating, currently, he is back on 45% of heated high flow again today.  Has completed a course of antibiotics.  He is still on steroids.  She has been followed by pulmonology.   Assessment & Plan:   Principal Problem:   Acute on chronic respiratory failure with hypoxia (HCC) Active Problems:   Type 2 diabetes mellitus with hypoglycemia without coma, without long-term current use of insulin (HCC)   Follicular lymphoma (HCC)   SOB (shortness of breath)   Interstitial lung disease (HCC)   Severe sepsis (HCC)   CAP (community acquired pneumonia)   Elevated troponin   Protein calorie malnutrition (HCC)   Protein-calorie malnutrition, severe   AF (paroxysmal atrial fibrillation) (HCC)   Thrombocytosis   Leukocytosis   Pressure injury of skin   Hyponatremia  Acute on chronic hypoxemic respiratory failure. Interstitial lung disease exacerbation Patient has a normal BNP, procalcitonin level.  No evidence of volume overload or bacterial infection. Patient has been followed by pulmonology, continue on prednisone and CellCept.  Patient is also on Bactrim and acyclovir. Patient condition does not seem to be improving, he had a recurrent worsening hypoxemia while in the hospital.  Condition may be terminal.  Palliative care is seeing the patient. Discussed  with Dr. Lanney Gins, we have run out of options for the patient.  May have to consider hospice if patient does not improve.   Severe protein calorie malnutrition. Continue protein supplement for  Paroxysmal atrial fibrillation. Continue Eliquis.  Follicular lymphoma.   DVT prophylaxis: Eliquis Code Status: full Family Communication:  Disposition Plan:      Status is: Inpatient   Remains inpatient appropriate because:Inpatient level of care appropriate due to severity of illness, patient has severe acute respite failure on  high flow oxygen.   Dispo: The patient is from: Home              Anticipated d/c is to: Home              Patient currently is not medically stable to d/c.              Difficult to place patient No         I/O last 3 completed shifts: In: 41 [P.O.:480; I.V.:10] Out: 2375 [Urine:2375] Total I/O In: 43 [P.O.:720] Out: 1200 [Urine:1200]     Consultants:  Pulmonology   Procedures: None   Antimicrobials: None  Subjective: Patient is still on 40% of heated high flow.  Short of breath with minimal exertion. No abdominal pain or nausea vomiting, had a bowel movement yesterday. No fever or chills. No dysuria hematuria.  Objective: Vitals:   02/04/21 0500 02/04/21 0727 02/04/21 0814 02/04/21 1123  BP:  110/65  108/64  Pulse:  80  89  Resp:  20  20  Temp:  97.6 F (36.4 C)  98.4 F (36.9 C)  TempSrc:  Oral    SpO2:  100% 92% 97%  Weight: 68.6 kg     Height:        Intake/Output Summary (Last 24 hours) at 02/04/2021 1223 Last data filed at 02/04/2021 1219 Gross per 24 hour  Intake 730 ml  Output 2500 ml  Net -1770 ml   Filed Weights   02/02/21 0500 02/03/21 0404 02/04/21 0500  Weight: 68.4 kg 67.2 kg 68.6 kg    Examination:  General exam: Appears calm and comfortable  Respiratory system: Crackles in the base.Marland Kitchen Respiratory effort normal. Cardiovascular system: S1 & S2 heard, RRR. No JVD, murmurs, rubs, gallops or clicks. No  pedal edema. Gastrointestinal system: Abdomen is nondistended, soft and nontender. No organomegaly or masses felt. Normal bowel sounds heard. Central nervous system: Alert and oriented. No focal neurological deficits. Extremities: Severe muscle atrophy. Skin: No rashes, lesions or ulcers Psychiatry: Judgement and insight appear normal. Mood & affect appropriate.     Data Reviewed: I have personally reviewed following labs and imaging studies  CBC: Recent Labs  Lab 01/30/21 1125 01/31/21 0600 02/01/21 0605 02/03/21 0530  WBC 39.5* 18.3* 15.2* 11.4*  NEUTROABS 34.4*  --  13.3*  --   HGB 12.2* 10.3* 10.2* 10.6*  HCT 36.2* 30.2* 30.2* 30.8*  MCV 84.2 83.7 83.7 83.0  PLT 319 182 179 824   Basic Metabolic Panel: Recent Labs  Lab 01/30/21 1125 01/31/21 0600 02/01/21 0605 02/04/21 0500  NA 131* 134* 134* 134*  K 3.9 3.8 3.8 3.9  CL 93* 92* 95* 95*  CO2 25 33* 36* 34*  GLUCOSE 163* 130* 127* 146*  BUN 22 18 18 17   CREATININE 0.71 0.58* 0.44* 0.50*  CALCIUM 8.0* 8.0* 8.0* 7.8*  MG  --   --   --  1.9  PHOS 2.9  --   --   --    GFR: Estimated Creatinine Clearance: 92.9 mL/min (A) (by C-G formula based on SCr of 0.5 mg/dL (L)). Liver Function Tests: Recent Labs  Lab 01/30/21 1125 02/01/21 0605  AST 38 19  ALT 38 28  ALKPHOS 70 56  BILITOT 0.9 0.7  PROT 4.8* 4.5*  ALBUMIN 2.7* 2.3*   No results for input(s): LIPASE, AMYLASE in the last 168 hours. No results for input(s): AMMONIA in the last 168 hours. Coagulation Profile: No results for input(s): INR, PROTIME in the last 168 hours. Cardiac Enzymes: No results for input(s): CKTOTAL, CKMB, CKMBINDEX, TROPONINI in the last 168 hours. BNP (last 3 results) No results for input(s): PROBNP in the last 8760 hours. HbA1C: No results for input(s): HGBA1C in the last 72 hours. CBG: Recent Labs  Lab 02/03/21 1152 02/03/21 1550 02/03/21 2043 02/04/21 0731 02/04/21 1211  GLUCAP 127* 138* 180* 145* 98   Lipid  Profile: No results for input(s): CHOL, HDL, LDLCALC, TRIG, CHOLHDL, LDLDIRECT in the last 72 hours. Thyroid Function Tests: No results for input(s): TSH, T4TOTAL, FREET4, T3FREE, THYROIDAB in the last 72 hours. Anemia Panel: No results for input(s): VITAMINB12, FOLATE, FERRITIN, TIBC, IRON, RETICCTPCT in the last 72 hours. Sepsis Labs: Recent Labs  Lab 01/30/21 1125 02/01/21 0605  PROCALCITON <0.10 <0.10    No results found for this or any previous visit (from the past 240 hour(s)).       Radiology Studies: No results found.      Scheduled Meds:  ALPRAZolam  0.25 mg Oral q morning   apixaban  5 mg Oral BID   Chlorhexidine Gluconate Cloth  6 each  Topical Daily   digoxin  0.125 mg Oral Daily   diltiazem  180 mg Oral Daily   docusate sodium  200 mg Oral BID   feeding supplement (NEPRO CARB STEADY)  237 mL Oral TID BM   furosemide  20 mg Intravenous Daily   guaiFENesin  1,200 mg Oral BID   insulin aspart  0-20 Units Subcutaneous TID WC   insulin aspart  0-5 Units Subcutaneous QHS   insulin aspart  10 Units Subcutaneous TID WC   insulin glargine-yfgn  9 Units Subcutaneous QHS   multivitamin with minerals  1 tablet Oral Daily   mycophenolate  500 mg Oral BID   polyethylene glycol  17 g Oral Daily   predniSONE  50 mg Oral Q breakfast   sodium chloride flush  10-40 mL Intracatheter Q12H   sulfamethoxazole-trimethoprim  1 tablet Oral Q12H   Continuous Infusions:   LOS: 22 days    Time spent: 28 minutes    Sharen Hones, MD [facility]   To contact the attending provider between 7A-7P or the covering provider during after hours 7P-7A, please log into the web site [email or web address] and access using universal [place] password for that web site. If you do not have the password, please call the hospital operator.  02/04/2021, 12:23 PM

## 2021-02-05 ENCOUNTER — Inpatient Hospital Stay: Payer: BC Managed Care – PPO

## 2021-02-05 DIAGNOSIS — Z7189 Other specified counseling: Secondary | ICD-10-CM | POA: Diagnosis not present

## 2021-02-05 DIAGNOSIS — J849 Interstitial pulmonary disease, unspecified: Secondary | ICD-10-CM | POA: Diagnosis not present

## 2021-02-05 DIAGNOSIS — J9621 Acute and chronic respiratory failure with hypoxia: Secondary | ICD-10-CM | POA: Diagnosis not present

## 2021-02-05 LAB — GLUCOSE, CAPILLARY
Glucose-Capillary: 93 mg/dL (ref 70–99)
Glucose-Capillary: 245 mg/dL — ABNORMAL HIGH (ref 70–99)
Glucose-Capillary: 206 mg/dL — ABNORMAL HIGH (ref 70–99)
Glucose-Capillary: 263 mg/dL — ABNORMAL HIGH (ref 70–99)

## 2021-02-05 LAB — C-REACTIVE PROTEIN: CRP: 2.2 mg/dL — ABNORMAL HIGH (ref <1.0)

## 2021-02-05 LAB — SEDIMENTATION RATE: Sed Rate: 52 mm/hr — ABNORMAL HIGH (ref 0–20)

## 2021-02-05 NOTE — Progress Notes (Signed)
Mr. Disney presents with acute on chronic hypoxemic respiratory failure consequent to  interstitial lung disease, specifically pulmonary fibrosis.  Patients PFT shows restrictive ventilatory defect and FEV1 is 44.2% predicted with an FVC of 40% predicted. The use of the NIMV will treat patients ventilatory defect and can reduce risk of exacerbations and future hospitalizations when used at night and during the day. All alternate devices [phone] and [id number]) have been proven ineffective to provide essential volume control necessary to maintain acceptable CO2 levels along with maintaining an appropriate volume for the patients lungs. An NIV with AVAPS AE is necessary to prevent patient harm.  Interruption or failure to provide NIV would quickly lead to exacerbation of the patients condition, hospital admission, and likely harm to the patient. Continued use is preferred.  Patient is able to protect their airways and clear secretions on their own.  [place], [place]

## 2021-02-05 NOTE — Progress Notes (Signed)
PT Cancellation Note  Patient Details Name: Edward Atkinson MRN: [id number] DOB: 1960-08-18   Cancelled Treatment:     PT attempt. 3 x today. 1st/2nd attempt. Pt on phone working. Pt is currently sitting in recliner, " I'm too tired right now. Can you come back tomorrow?"    Willette Pa 02/05/2021, 3:32 PM

## 2021-02-05 NOTE — Progress Notes (Signed)
Daily Progress Note   Patient Name: Edward Atkinson       Date: 02/05/2021 DOB: 08-24-1960  Age: 60 y.o. MRN#: [id number] Attending Physician: Sharen Hones, MD Primary Care Physician: Rusty Aus, MD Admit Date: 01/13/2021  Reason for Consultation/Follow-up: Establishing goals of care  Subjective: Patient is sitting in bed on high flow cannula. He states he over-did it a little yesterday, "but it wasn't a big deal", and that when he gets in the chair later today, he will be placed back on simple [place]. He states staff is working to get him set up to go home.   Previously goals of care were discussed, and patient understood poor prognosis. Goals set for full code/full scope as he wants to do what his family wants. He tells me he feels like he is going to improve in status, though it will take time. He states he believes he may require PT for years. He states he is working closely with pulmonology for planning.  Length of Stay: 23  Current Medications: Scheduled Meds:  . ALPRAZolam  0.25 mg Oral q morning  . apixaban  5 mg Oral BID  . Chlorhexidine Gluconate Cloth  6 each Topical Daily  . digoxin  0.125 mg Oral Daily  . diltiazem  180 mg Oral Daily  . docusate sodium  200 mg Oral BID  . feeding supplement (NEPRO CARB STEADY)  237 mL Oral TID BM  . furosemide  20 mg Intravenous Daily  . guaiFENesin  1,200 mg Oral BID  . insulin aspart  0-20 Units Subcutaneous TID WC  . insulin aspart  0-5 Units Subcutaneous QHS  . insulin aspart  10 Units Subcutaneous TID WC  . insulin glargine-yfgn  9 Units Subcutaneous QHS  . multivitamin with minerals  1 tablet Oral Daily  . mycophenolate  750 mg Oral BID  . polyethylene glycol  17 g Oral Daily  . predniSONE  50 mg Oral Q breakfast  . sodium chloride  flush  10-40 mL Intracatheter Q12H  . sulfamethoxazole-trimethoprim  1 tablet Oral Q12H    Continuous Infusions:   PRN Meds: acetaminophen **OR** acetaminophen, ALPRAZolam, diltiazem, guaiFENesin-dextromethorphan, lactulose, levalbuterol, morphine injection, simethicone, sodium chloride, sodium chloride flush, traZODone  Physical Exam Pulmonary:     Comments: On high flow cannula.  Neurological:  Mental Status: He is alert.            Vital Signs: BP 130/65 (BP Location: Right Arm)   Pulse 97   Temp 98.4 F (36.9 C)   Resp 18   Ht 6' (1.829 m)   Wt 68.6 kg   SpO2 92%   BMI 20.51 kg/m  SpO2: SpO2: 92 % O2 Device: O2 Device: High Flow Nasal Cannula O2 Flow Rate: O2 Flow Rate (L/min): (S) 50 L/min  Intake/output summary:  Intake/Output Summary (Last 24 hours) at 02/05/2021 1408 Last data filed at 02/05/2021 1400 Gross per 24 hour  Intake 1560 ml  Output 1700 ml  Net -140 ml   LBM: Last BM Date: 02/05/21 Baseline Weight: Weight: 63.5 kg Most recent weight: Weight: 68.6 kg         Patient Active Problem List   Diagnosis Date Noted  . Hyponatremia 02/03/2021  . Pressure injury of skin 02/02/2021  . Leukocytosis   . AF (paroxysmal atrial fibrillation) ([place])   . Thrombocytosis   . Protein-calorie malnutrition, severe 01/15/2021  . Severe sepsis ([place]) 01/14/2021  . CAP (community acquired pneumonia) 01/14/2021  . Elevated troponin 01/14/2021  . Protein calorie malnutrition ([place]) 01/14/2021  . Acute on chronic respiratory failure with hypoxia ([place]) 01/13/2021  . Encounter for monoclonal antibody treatment for malignancy 07/02/2020  . Interstitial lung disease ([place]) 07/02/2020  . Lymphocytopenia   . SOB (shortness of breath)   . Sepsis ([place]) 07/29/2019  . Thrombocytopenia ([place]) 07/29/2019  . History of DVT (deep vein thrombosis) 07/06/2019  . Transaminitis 07/06/2019  . History of pulmonary embolism 07/06/2019  . Encounter for antineoplastic chemotherapy  04/29/2019  . Pulmonary embolus ([place]) 04/29/2019  . Follicular lymphoma ([place]) 02/15/2019  . Goals of care, counseling/discussion 02/15/2019  . DVT (deep venous thrombosis) ([place]) 02/12/2019  . Prostatitis 02/07/2019  . 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 Other pulmonary embolism with acute cor pulmonale ([place]) 02/06/2019  . Type 2 diabetes mellitus with hypoglycemia without coma, without long-term current use of insulin ([place]) 02/06/2019  . Massive pulmonary hemorrhage originating in perinatal period 01/29/2019    Palliative Care Assessment & Plan    Recommendations/Plan: Recommend outpatient palliative.   Poor prognosis. Patient is currently a candidate for hospice at home should he choose that path.     Code Status:    Code Status Orders  (From admission, onward)           Start     Ordered   01/13/21 1831  Full code  Continuous        01/13/21 1831           Code Status History     Date Active Date Inactive Code Status Order ID Comments User Context   07/29/2019 1447 08/01/2019 1954 Full Code [id number]  Ivor Costa, MD ED   01/29/2019 1601 02/01/2019 1733 Full Code [id number]  Nicholes Mango, MD ED      Advance Directive Documentation    Flowsheet Row Most Recent Value  Type of Advance Directive Living will, Healthcare Power of Attorney  Pre-existing out of facility DNR order (yellow form or pink MOST form) --  "MOST" Form in Place? --       Prognosis:  < 6 months  Thank you for allowing the Palliative Medicine Team to assist in the care of this patient.       Total Time 15 min Prolonged Time Billed  no  Greater than 50%  of this time was spent counseling and coordinating care related to the above assessment and plan.  Asencion Gowda, NP  Please contact Palliative Medicine Team phone at [phone] for questions and concerns.

## 2021-02-05 NOTE — Progress Notes (Signed)
PROGRESS NOTE    Edward Atkinson  BTD:[id number] DOB: 1961/04/09 DOA: 01/13/2021 PCP: Rusty Aus, MD    Brief Narrative:   60 year old man with history of follicular lymphoma, [id number] infection, interstitial lung disease and chronic respiratory failure, and type 2 diabetes mellitus.  Patient was admitted 20 days ago with acute hypoxic respiratory failure and interstitial lung disease exacerbation.  Patient had a CT scan of the chest that did not show any pulmonary embolism.  The patient was on heated high flow nasal cannula during the course and then was tapered over to bubble high flow nasal cannula.  Patient condition appears to be fluctuating, currently, he is back on 45% of heated high flow again today.  Has completed a course of antibiotics.  He is still on steroids.  He has been followed by pulmonology  Assessment & Plan:   Principal Problem:   Acute on chronic respiratory failure with hypoxia ([place]) Active Problems:   Type 2 diabetes mellitus with hypoglycemia without coma, without long-term current use of insulin (HCC)   Follicular lymphoma (HCC)   SOB (shortness of breath)   Interstitial lung disease (HCC)   Severe sepsis (HCC)   CAP (community acquired pneumonia)   Elevated troponin   Protein calorie malnutrition (HCC)   Protein-calorie malnutrition, severe   AF (paroxysmal atrial fibrillation) (HCC)   Thrombocytosis   Leukocytosis   Pressure injury of skin   Hyponatremia  Acute on chronic hypoxemic respiratory failure. Interstitial lung disease exacerbation Patient condition is a slow to recover, he does not have any evidence of bacterial pneumonia or volume overload. He is a followed by pulmonology for interstitial lung disease, currently on prednisone and CellCept.  Also on prophylactic Bactrim and acyclovir. Patient currently on 35% of heated high flow which is a improvement from yesterday, but significant desaturation with minimal exertion. Patient is still not  able to discharge this time. Home use trilogy has been prepared for discharge.   Severe protein calorie malnutrition. Continue protein supplement for  Paroxysmal atrial fibrillation. Continue Eliquis.  Follicular lymphoma.   DVT prophylaxis: Eliquis Code Status: full Family Communication:  Disposition Plan:      Status is: Inpatient   Remains inpatient appropriate because:Inpatient level of care appropriate due to severity of illness, patient has severe acute respite failure on  high flow oxygen.   Dispo: The patient is from: Home              Anticipated d/c is to: Home              Patient currently is not medically stable to d/c.              Difficult to place patient No      I/O last 3 completed shifts: In: 1930 [P.O.:1920; I.V.:10] Out: 3400 [Urine:3400] Total I/O In: 600 [P.O.:600] Out: 800 [Urine:800]    Consultants:  Pulmonology   Procedures: None   Antimicrobials: None     Subjective: Patient is currently on 35% of heated high flow.  He does not feel short of breath at rest, significant desaturation with exertion. No cough today. No fever or chills. No dysuria hematuria.  Objective: Vitals:   02/05/21 0438 02/05/21 0743 02/05/21 0750 02/05/21 1113  BP: 120/69 119/69  130/65  Pulse: 76 83  97  Resp: 20 18  18   Temp: 97.7 F (36.5 C) 98.5 F (36.9 C)  98.4 F (36.9 C)  TempSrc:  Oral    SpO2: 97% 99% 99%  91%  Weight:      Height:        Intake/Output Summary (Last 24 hours) at 02/05/2021 1309 Last data filed at 02/05/2021 1123 Gross per 24 hour  Intake 1560 ml  Output 1700 ml  Net -140 ml   Filed Weights   02/02/21 0500 02/03/21 0404 02/04/21 0500  Weight: 68.4 kg 67.2 kg 68.6 kg    Examination:  General exam: Appears calm and comfortable  Respiratory system: Crackles in the base.Marland Kitchen Respiratory effort normal. Cardiovascular system: S1 & S2 heard, RRR. No JVD, murmurs, rubs, gallops or clicks. No pedal edema. Gastrointestinal  system: Abdomen is nondistended, soft and nontender. No organomegaly or masses felt. Normal bowel sounds heard. Central nervous system: Alert and oriented. No focal neurological deficits. Extremities: Symmetric 5 x 5 power. Skin: No rashes, lesions or ulcers Psychiatry: Judgement and insight appear normal. Mood & affect appropriate.     Data Reviewed: I have personally reviewed following labs and imaging studies  CBC: Recent Labs  Lab 01/30/21 1125 01/31/21 0600 02/01/21 0605 02/03/21 0530  WBC 39.5* 18.3* 15.2* 11.4*  NEUTROABS 34.4*  --  13.3*  --   HGB 12.2* 10.3* 10.2* 10.6*  HCT 36.2* 30.2* 30.2* 30.8*  MCV 84.2 83.7 83.7 83.0  PLT 319 182 179 397   Basic Metabolic Panel: Recent Labs  Lab 01/30/21 1125 01/31/21 0600 02/01/21 0605 02/04/21 0500  NA 131* 134* 134* 134*  K 3.9 3.8 3.8 3.9  CL 93* 92* 95* 95*  CO2 25 33* 36* 34*  GLUCOSE 163* 130* 127* 146*  BUN 22 18 18 17   CREATININE 0.71 0.58* 0.44* 0.50*  CALCIUM 8.0* 8.0* 8.0* 7.8*  MG  --   --   --  1.9  PHOS 2.9  --   --   --    GFR: Estimated Creatinine Clearance: 92.9 mL/min (A) (by C-G formula based on SCr of 0.5 mg/dL (L)). Liver Function Tests: Recent Labs  Lab 01/30/21 1125 02/01/21 0605  AST 38 19  ALT 38 28  ALKPHOS 70 56  BILITOT 0.9 0.7  PROT 4.8* 4.5*  ALBUMIN 2.7* 2.3*   No results for input(s): LIPASE, AMYLASE in the last 168 hours. No results for input(s): AMMONIA in the last 168 hours. Coagulation Profile: No results for input(s): INR, PROTIME in the last 168 hours. Cardiac Enzymes: No results for input(s): CKTOTAL, CKMB, CKMBINDEX, TROPONINI in the last 168 hours. BNP (last 3 results) No results for input(s): PROBNP in the last 8760 hours. HbA1C: No results for input(s): HGBA1C in the last 72 hours. CBG: Recent Labs  Lab 02/04/21 1211 02/04/21 1636 02/04/21 2111 02/05/21 0744 02/05/21 1121  GLUCAP 98 251* 112* 93 245*   Lipid Profile: No results for input(s): CHOL,  HDL, LDLCALC, TRIG, CHOLHDL, LDLDIRECT in the last 72 hours. Thyroid Function Tests: No results for input(s): TSH, T4TOTAL, FREET4, T3FREE, THYROIDAB in the last 72 hours. Anemia Panel: No results for input(s): VITAMINB12, FOLATE, FERRITIN, TIBC, IRON, RETICCTPCT in the last 72 hours. Sepsis Labs: Recent Labs  Lab 01/30/21 1125 02/01/21 0605  PROCALCITON <0.10 <0.10    No results found for this or any previous visit (from the past 240 hour(s)).       Radiology Studies: DG Chest Port 1 View  Result Date: 02/05/2021 CLINICAL DATA:  Bilateral pulmonary infiltrates on chest x-ray. EXAM: PORTABLE CHEST 1 VIEW COMPARISON:  Chest radiograph 02/01/2021 FINDINGS: Power injectable right IJ port central venous catheter tip projects at the level of  the superior cavoatrial junction. Unchanged low lung volumes with lower lobe predominant interstitial opacities, left greater than right. No new focal consolidation, pleural effusion, or pneumothorax. Heart is normal in size. Surgical clips in the right upper abdomen. IMPRESSION: Bilateral interstitial airspace opacities are unchanged compared to 02/01/2021. No new focal consolidation, pleural effusion, or pneumothorax. Electronically Signed   By: Ileana Roup M.D.   On: 02/05/2021 10:59        Scheduled Meds:  ALPRAZolam  0.25 mg Oral q morning   apixaban  5 mg Oral BID   Chlorhexidine Gluconate Cloth  6 each Topical Daily   digoxin  0.125 mg Oral Daily   diltiazem  180 mg Oral Daily   docusate sodium  200 mg Oral BID   feeding supplement (NEPRO CARB STEADY)  237 mL Oral TID BM   furosemide  20 mg Intravenous Daily   guaiFENesin  1,200 mg Oral BID   insulin aspart  0-20 Units Subcutaneous TID WC   insulin aspart  0-5 Units Subcutaneous QHS   insulin aspart  10 Units Subcutaneous TID WC   insulin glargine-yfgn  9 Units Subcutaneous QHS   multivitamin with minerals  1 tablet Oral Daily   mycophenolate  750 mg Oral BID   polyethylene glycol   17 g Oral Daily   predniSONE  50 mg Oral Q breakfast   sodium chloride flush  10-40 mL Intracatheter Q12H   sulfamethoxazole-trimethoprim  1 tablet Oral Q12H   Continuous Infusions:   LOS: 23 days    Time spent: 27 minutes    Sharen Hones, MD [facility]   To contact the attending provider between 7A-7P or the covering provider during after hours 7P-7A, please log into the web site [email or web address] and access using universal [place] password for that web site. If you do not have the password, please call the hospital operator.  02/05/2021, 1:09 PM

## 2021-02-05 NOTE — TOC Progression Note (Signed)
Transition of Care [facility]) - Progression Note    Patient Details  Name: Edward Atkinson MRN: [id number] Date of Birth: 08-11-1960  [facility] CM/SW [place], [place] Phone Number: 02/05/2021, 3:15 PM  Clinical Narrative:     [facility] working on trilogy for discharge planning with home health, patient continues to be on HFNC. MD suggests CSW see if patient eligible for LTACH, SW has made referral to [facility] with [facility] pending response.   Expected Discharge Plan: Home/Self Care Barriers to Discharge: Continued Medical Work up  Expected Discharge Plan and Services Expected Discharge Plan: Home/Self Care     Post Acute Care Choice: NA Living arrangements for the past 2 months: Single Family Home                                       Social Determinants of Health (SDOH) Interventions    Readmission Risk Interventions Readmission Risk Prevention Plan 01/17/2021  Transportation Screening Complete  PCP or Specialist Appt within 3-5 Days Complete  Social Work Consult for [place] Planning/Counseling [place] Not Applicable  Medication Review Press photographer) Complete  Some recent data might be hidden

## 2021-02-05 NOTE — Progress Notes (Signed)
Pulmonary Medicine          Date: 02/05/2021,   MRN# [id number] NYREE YONKER 02-16-1961     AdmissionWeight: 63.5 kg                 CurrentWeight: 68.6 kg   Referring physician: Dr Roderic Palau   CHIEF COMPLAINT:   Acute on chronic hypoxemic respiratory failure   HISTORY OF PRESENT ILLNESS   60 yo M w/hx of chronic hypoxemia and ILD post COVID19, T2DM, PE on eliquis, Lymphoma s/p Rituxan which is completed.  Came in due to worsening SOB and DOE specifically mild exertional defacation/urination with feelings of presyncope and severe dyspnea which started to get worse post tapering from steroid appx few days prior to onset of symptom progression.  He was placed on solumedrol and reported mild improvement.  He is on 10L /min [place] during my evaluation.  He had CT chest done with PE protocol and noted to have bilateral GGO infiltrates worse compared to June study. PCCM consultation for further evaluation and management.     01/17/21-  patient seems to be slighly clinically worse post reduction of IV steroids yesterday indicative of ongoing ILD exacerbation, thus far infectious workup is negative but inflammatory biomarkers are highly elevated also pointing towards ILD exacerbation.  There is new CXR in process this morning for interval changes. We discussed increasing steroids slightly and giving more time.  I will also initiate steroid sparing immunomodulation with BID cellcept as patient will likely need therapy for prolonged time period.  Compensatory tachycardia noted appreciate cardiology team.  Sugars with peaks and valleys with adjusted steroid therapy.  Patient with muscle wasting and protein calorie malnutrition but eating better this am.  01/18/21- patient is on HFNC at 60%, feels better, has not been able to tolerate BIPAP.  Wife is at bedside.  BP is borderline low MAP 76.  He has negative infectious workup and thus far ILD exacerbation seems to be main differential. He  continues to have peaks and valleys in blood glucose.  We started steroid sparing regimen with cellcept bid 250 mg and will increase dose as we taper off prednisone.  Remains tachyarrtythmic but improved. Appreciate everyone involved.     01/19/21- patient seen and examined at bedside.  Wife present during evaluation.  Ow HFNC weaned to 41%/45L/MIN.  CRP is still up, have increased cellcept and reducing solumedrol.  Patient felt stronger physically and was able to get OOB to chair. Cough is less now.  He is non-labored with breathing no accessory mm use today. He had dark stools with reduced hb, FOBT ordred.  Haptoglobin is elevated. Pathology slide review ordered.   01/20/21- patient is essentially unchanged from yesterday from pulmonary perspective. CXR also unchanged,  bloodwork with reduction in CRP.  Plan to wean O2 and continue OOB with PT/OT,  Continue current steroid dose and Cellcept.   01/21/21- patient is stable on HFNC.  He had small set back with increased O2 req after exerting himself in bathroom.  HFNC from 41/45 to 54/45.   He diuresed very well with lasix 20 bid >2L urine in past 24h.  Tachycardia is improved. He does not feel weaker and infact states he feels slowly improved.   01/22/21- I spoke to patient regarding goals of care and recommend DNR.  He is agreeable but I asked him to discuss with wife and family out of respect and we will revisit tommorow.  He states he is feeling better  and CRP is trending down but he remains on HFNC 60/40.  He was able to get OOB today and walked to bathroom but experienced mild desaturation with quick recovery "not much issues".      01/23/21- patient is still on high flow nasal canula , he is on 56%/45L.  We reviewed code status and he is still in discussion with family regarding DNR.   We discussed lung transplant per family request and this may be a future option if he is strong enough and recovers.  I have increased insluin TID to 20 from 15 and  increased bactrim to bid SS   01/24/21- patient weaned on HFNC to 49/45, he was able to use BIPAP overnight thinks its helping.  CXR this AM with interval improvement. Will keep on current dose of meds.  Repeat CRP in am.  Last BM yesterday well formed no blood, eating good, urine output very good on 20 bid lasix. Getting OOB with  transient desaturation.  Overall slight improvement.    01/25/21- patient has weaned down to 35%/40L/min.  He remains full code. Reviewed medical plan with Dr Jimmye Norman today.  Reduced steroids from 50 BID to 40 BID and also reduced insulin slightly.    01/26/21- patient is further weaned to 15L humidified HF nasal canula.  He did PT ROM exercises with mild desaturation. CXR today with expiratory image unable to see much interval changes. Patient reports improvement clinically. Plan to continue current regimen. Sugars slightly high due to steroids, will wean steroids soon. Continue cellcept as current 500 bid  01/27/21- patient is relatively same as yesterday, his O2 is weaned down to 14L.  He remains on IV solumedrol at 50 bid, we did discuss weaning this further to 40 BID. Will keep cellcept at current dose 500 bid.  His ankles have been down in seated position and are edematous on examination. He continues to participate with PT and feels good subjectively. We discussed starting dose of lasix for pedal edema.    01/28/21- patient examined at bedside, s/p PT/OT session with slight dyspnea.  He was weaned to 10L O2 but then went back up to 13 post PT due to increased WOB.  He has complete resolution of LE edema and appears to be euvolemic at this point. We discussed reducing soluemdrol today to 20 bid.  I reviewed plan with Dr Leslye Peer today.  01/29/21- patient examined at bedside.  He had a good day and feels better. We reviewed medical plan and are reducing steroids to PO prednisone starting at 1m daily.   01/30/21-  Patient seen and examined at bedside.  CXR this am with  worsening pulmonary edema, included below.  Vitals stable with mild tachypnea and tachycardia with activity. Will deliver 1 dose IV lasix 20 today. Plan to continue Pred PO and cellcept at 500 bid. Pulmonary intensive PT with IS and will initiate Metaneb therapy with albuterol. Blood work today will be repeated CRP CMP phos procal ESR.  Patient is agreeable to BIPAP QHS and Metaneb BID.   01/31/21-  patient had set back yesterday with mild flash pulm edema on CXR. He diuresed very well overnight and feels back to where he was at previously.  He had transient reactive leukocytosis as well.  We initiated empirically zosyn and zithromax on top of bid bactrim.  This probably could be dcd since he recovered so quickly which would not occur with pneumonia.  Ill go head and dc this today but continue PO bactrim.  Will stay on pred 50 today with cellcept at 500 bid as previous.  His O2 has been reduced from 95 to 70% overnight and hes working with PT today. Hopefully we can wean this back down to 10L as he was previosly then back to his home setting.   02/01/21- patient is somewhat better today, he is being weaned on O2 and is now down to 40%. He had BM today. Leaving prednisone at current dose and cxr today.   02/02/21- patient is now on 15L HF bubbler.  He reports feeling better.  He had 3 bowel movements after stool softner. He is working with PT/OT.  His long term prognosis is poor unfortunately and we appreciate collaboration with Palliative care team.   02/03/21- patient had over exerted himself again and required increased O2. He states he feels better and just did more then he should have with PT.  His wbc count is trending down. His CRP is trending down.  I have started morhpine for air hunger and pain at sacrum for skin breakdown and pressure ulceration.     02/04/21- patient is unchanged.  He wants to keep fighting to improve.  CRP is trending down again.  ESR is upward trending. He was unable to perform  adequately with PT and had severe dyspnea and desaturation just to get OOB to chair. Discussed case with [employer] to see if he can have HFNC at home but the device has been recalled. Discussed patient with Dr Roosevelt Locks plan is for additional diuresis.  I have increased cellcept to 750 BID today  02/05/21- patient with UOP >2L yesterday, his spO2 req remains HFNC 35/40.  He feels well and wants to keep working to be discharged to rehab. We have ordered NIV for him.  Patient has pulmonary fibrosis with restrictive lung disease and would benefit from device such as Trilogy 100/200 with O2 bleed in.  His overall prognosis is poor.     PAST MEDICAL HISTORY   Past Medical History:  Diagnosis Date  . Chronic respiratory failure with hypoxia (HCC)    2L [place] continuous  . COVID-19   . Diabetes mellitus without complication ([place])   . Diverticulitis   . Follicular lymphoma of intra-abdominal lymph nodes ([place]) 02/15/2019  . ILD (interstitial lung disease) ([place])      SURGICAL HISTORY   Past Surgical History:  Procedure Laterality Date  . CHOLECYSTECTOMY    . COLON RESECTION     DUE TO DIVERTICULITIS  . FLEXIBLE BRONCHOSCOPY N/A 12/02/2019   Procedure: FLEXIBLE BRONCHOSCOPY;  Surgeon: Ottie Glazier, MD;  Location: [facility];  Service: Thoracic;  Laterality: N/A;  . PORTA CATH INSERTION N/A 03/11/2019   Procedure: PORTA CATH INSERTION;  Surgeon: Algernon Huxley, MD;  Location: [facility];  Service: Cardiovascular;  Laterality: N/A;  . PULMONARY THROMBECTOMY N/A 01/29/2019   Procedure: PULMONARY THROMBECTOMY;  Surgeon: Algernon Huxley, MD;  Location: [facility];  Service: Cardiovascular;  Laterality: N/A;     FAMILY HISTORY   Family History  Problem Relation Age of Onset  . Diabetes Brother      SOCIAL HISTORY   Social History   Tobacco Use  . Smoking status: Never  . Smokeless tobacco: Never  Vaping Use  . Vaping Use: Never used  Substance Use Topics  . Alcohol use: Yes     Alcohol/week: 0.0 - 2.0 standard drinks    Comment: occasional  . Drug use: Never     MEDICATIONS  Home Medication:    Current Medication:  Current Facility-Administered Medications:  .  acetaminophen (TYLENOL) tablet 650 mg, 650 mg, Oral, Q6H PRN, 650 mg at 02/05/21 0749 **OR** acetaminophen (TYLENOL) suppository 650 mg, 650 mg, Rectal, Q6H PRN, Howerter, Justin B, DO .  ALPRAZolam (XANAX) tablet 0.25 mg, 0.25 mg, Oral, QHS PRN, Sharen Hones, MD .  ALPRAZolam Duanne Moron) tablet 0.25 mg, 0.25 mg, Oral, q morning, Sharen Hones, MD, 0.25 mg at 02/05/21 0845 .  apixaban (ELIQUIS) tablet 5 mg, 5 mg, Oral, BID, Beers, Shanon Brow, RPH, 5 mg at 02/05/21 0845 .  Chlorhexidine Gluconate Cloth 2 % PADS 6 each, 6 each, Topical, Daily, Kathie Dike, MD, 6 each at 02/03/21 1347 .  digoxin (LANOXIN) tablet 0.125 mg, 0.125 mg, Oral, Daily, Memon, Jehanzeb, MD, 0.125 mg at 02/05/21 0845 .  diltiazem (CARDIZEM CD) 24 hr capsule 180 mg, 180 mg, Oral, Daily, Paraschos, Alexander, MD, 180 mg at 02/05/21 0845 .  diltiazem (CARDIZEM) injection 10 mg, 10 mg, Intravenous, Q6H PRN, Wyvonnia Dusky, MD, 10 mg at 01/24/21 2328 .  docusate sodium (COLACE) capsule 200 mg, 200 mg, Oral, BID, Wyvonnia Dusky, MD, 200 mg at 02/05/21 0844 .  feeding supplement (NEPRO CARB STEADY) liquid 237 mL, 237 mL, Oral, TID BM, Memon, Jehanzeb, MD, Last Rate: 0 mL/hr at 01/21/21 2130, 237 mL at 02/04/21 2155 .  furosemide (LASIX) injection 20 mg, 20 mg, Intravenous, Daily, Lanney Gins, Jaimon Bugaj, MD, 20 mg at 02/05/21 0845 .  guaiFENesin (MUCINEX) 12 hr tablet 1,200 mg, 1,200 mg, Oral, BID, Kathie Dike, MD, 1,200 mg at 02/05/21 0845 .  guaiFENesin-dextromethorphan (ROBITUSSIN DM) 100-10 MG/5ML syrup 5 mL, 5 mL, Oral, Q4H PRN, Kathie Dike, MD, 5 mL at 02/04/21 0850 .  insulin aspart (novoLOG) injection 0-20 Units, 0-20 Units, Subcutaneous, TID WC, Kathie Dike, MD, 11 Units at 02/04/21 1706 .  insulin aspart (novoLOG)  injection 0-5 Units, 0-5 Units, Subcutaneous, QHS, Kathie Dike, MD, 4 Units at 01/31/21 2154 .  insulin aspart (novoLOG) injection 10 Units, 10 Units, Subcutaneous, TID WC, Loletha Grayer, MD, 10 Units at 02/05/21 0846 .  insulin glargine-yfgn (SEMGLEE) injection 9 Units, 9 Units, Subcutaneous, QHS, Loletha Grayer, MD, 9 Units at 02/04/21 2155 .  lactulose (CHRONULAC) 10 GM/15ML solution 30 g, 30 g, Oral, BID PRN, Leslye Peer, Richard, MD .  levalbuterol [facility]) nebulizer solution 1.25 mg, 1.25 mg, Nebulization, Q4H PRN, Howerter, Justin B, DO, 1.25 mg at 01/17/21 2011 .  morphine 2 MG/ML injection 1 mg, 1 mg, Intravenous, Q3H PRN, Ottie Glazier, MD, 1 mg at 02/04/21 1401 .  multivitamin with minerals tablet 1 tablet, 1 tablet, Oral, Daily, Kathie Dike, MD, 1 tablet at 02/04/21 0849 .  mycophenolate (CELLCEPT) capsule 750 mg, 750 mg, Oral, BID, Lanney Gins, Kaoru Rezendes, MD, 750 mg at 02/05/21 0844 .  polyethylene glycol (MIRALAX / GLYCOLAX) packet 17 g, 17 g, Oral, Daily, Loletha Grayer, MD, 17 g at 02/01/21 0937 .  predniSONE (DELTASONE) tablet 50 mg, 50 mg, Oral, Q breakfast, Prescious Hurless, MD, 50 mg at 02/05/21 0844 .  simethicone (MYLICON) chewable tablet 80 mg, 80 mg, Oral, QID PRN, Loletha Grayer, MD, 80 mg at 01/29/21 1411 .  sodium chloride (OCEAN) 0.65 % nasal spray 1 spray, 1 spray, Each Nare, PRN, Loletha Grayer, MD, 1 spray at 01/30/21 2142 .  sodium chloride flush (NS) 0.9 % injection 10-40 mL, 10-40 mL, Intracatheter, Q12H, Kathie Dike, MD, 10 mL at 02/05/21 0849 .  sodium chloride flush (NS) 0.9 % injection 10-40 mL,  10-40 mL, Intracatheter, PRN, Kathie Dike, MD, 10 mL at 01/24/21 0915 .  sulfamethoxazole-trimethoprim (BACTRIM) 400-80 MG per tablet 1 tablet, 1 tablet, Oral, Q12H, Ottie Glazier, MD, 1 tablet at 02/05/21 0844 .  traZODone (DESYREL) tablet 50 mg, 50 mg, Oral, QHS PRN, Sharion Settler, NP, 50 mg at 02/03/21 2137    ALLERGIES    Penicillins     REVIEW OF SYSTEMS    Review of Systems:  Gen:  Denies  fever, sweats, chills weigh loss  HEENT: Denies blurred vision, double vision, ear pain, eye pain, hearing loss, nose bleeds, sore throat Cardiac:  No dizziness, chest pain or heaviness, chest tightness,edema Resp:  admits to severe dyspnea Gi: Denies swallowing difficulty, stomach pain, nausea or vomiting, diarrhea, constipation, bowel incontinence Gu:  Denies bladder incontinence, burning urine Ext:   Denies Joint pain, stiffness or swelling Skin: Denies  skin rash, easy bruising or bleeding or hives Endoc:  Denies polyuria, polydipsia , polyphagia or weight change Psych:   admits to axniety  Other:  All other systems negative   VS: BP 119/69 (BP Location: Right Arm)   Pulse 83   Temp 98.5 F (36.9 C) (Oral)   Resp 18   Ht 6' (1.829 m)   Wt 68.6 kg   SpO2 99%   BMI 20.51 kg/m      PHYSICAL EXAM    GENERAL:NAD, no fevers, chills, no weakness no fatigue HEAD: Normocephalic, atraumatic.  EYES: Pupils equal, round, reactive to light. Extraocular muscles intact. No scleral icterus.  MOUTH: Moist mucosal membrane. Dentition intact. No abscess noted.  EAR, NOSE, THROAT: Clear without exudates. No external lesions.  NECK: Supple. No thyromegaly. No nodules. No JVD.  PULMONARY: Bilateral rhonchi improved from previous CARDIOVASCULAR: S1 and S2. Regular rate and rhythm. No murmurs, rubs, or gallops. No edema. Pedal pulses 2+ bilaterally.  GASTROINTESTINAL: Soft, nontender, nondistended. No masses. Positive bowel sounds. No hepatosplenomegaly.  MUSCULOSKELETAL: No swelling, clubbing, or edema. Range of motion full in all extremities.  NEUROLOGIC: Cranial nerves II through XII are intact. No gross focal neurological deficits. Sensation intact. Reflexes intact.  SKIN: No ulceration, lesions, rashes, or cyanosis. Skin warm and dry. Turgor intact.  PSYCHIATRIC: Mood, affect within normal limits. The  patient is awake, alert and oriented x 3. Insight, judgment intact.       IMAGING    CT Angio Chest PE W/Cm &/Or Wo Cm  Result Date: 01/13/2021 CLINICAL DATA:  Respiratory distress, hypoxia EXAM: CT ANGIOGRAPHY CHEST WITH CONTRAST TECHNIQUE: Multidetector CT imaging of the chest was performed using the standard protocol during bolus administration of intravenous contrast. Multiplanar CT image reconstructions and MIPs were obtained to evaluate the vascular anatomy. CONTRAST:  58m OMNIPAQUE IOHEXOL 350 MG/ML SOLN COMPARISON:  10/26/2020, 01/13/2021 FINDINGS: Cardiovascular: This is a technically adequate evaluation of the pulmonary vasculature. No filling defects or pulmonary emboli. The heart is unremarkable without pericardial effusion. No evidence of thoracic aortic aneurysm or dissection. Right chest wall port via internal jugular approach tip within the superior vena cava. Mediastinum/Nodes: No pathologic adenopathy within the mediastinum, hila, or axilla. Thyroid, trachea, and esophagus are unremarkable. Lungs/Pleura: There has been interval progression of the multifocal ground-glass airspace disease seen previously. Changes are most pronounced within the dependent lower lobes. No effusion or pneumothorax. The central airways are patent, with persistent bronchiectasis. Upper Abdomen: No acute abnormality. Musculoskeletal: No acute or destructive bony lesions. Reconstructed images demonstrate no additional findings. Review of the MIP images confirms the above findings. IMPRESSION: 1. No  evidence of pulmonary embolus. 2. Progressive multifocal bilateral ground-glass airspace disease, greatest at the lung bases. The differential diagnosis would include progressive postinflammatory scarring and fibrosis, multifocal atypical pneumonia, or hypersensitivity/drug toxicity. 3. Stable bronchiectasis. Electronically Signed   By: Randa Ngo M.D.   On: 01/13/2021 17:13   DG Chest Port 1 View  Result Date:  02/01/2021 CLINICAL DATA:  Shortness of breath, edema EXAM: PORTABLE CHEST 1 VIEW COMPARISON:  01/30/2021 FINDINGS: Stable positioning of right IJ approach Port-A-Cath. Heart size within normal limits. Low lung volumes. Slight interval worsening of bilateral airspace opacities, left worse than right. No pleural effusion or pneumothorax. IMPRESSION: Slight interval worsening of bilateral airspace opacities, left worse than right. Electronically Signed   By: Davina Poke D.O.   On: 02/01/2021 14:37   DG Chest Port 1 View  Result Date: 01/30/2021 CLINICAL DATA:  Acute on chronic hypoxemic respiratory failure. EXAM: PORTABLE CHEST 1 VIEW COMPARISON:  January 28, 2021 FINDINGS: No pneumothorax. Stable cardiomediastinal silhouette and right Port-A-Cath. Left-greater-than-right pulmonary opacities/infiltrates. No other interval changes. IMPRESSION: 1. Persistent low lung volumes and bilateral pulmonary infiltrates. No interval changes. Electronically Signed   By: Dorise Bullion III M.D.   On: 01/30/2021 08:36   DG Chest Port 1 View  Result Date: 01/28/2021 CLINICAL DATA:  Shortness of breath. EXAM: PORTABLE CHEST 1 VIEW COMPARISON:  Multiple recent chest films. FINDINGS: The right IJ power port is stable. Persistent low lung volumes and bilateral pulmonary infiltrates. No definite pleural effusion or pneumothorax. IMPRESSION: Persistent low lung volumes and bilateral pulmonary infiltrates. Electronically Signed   By: Marijo Sanes M.D.   On: 01/28/2021 16:03   DG Chest Port 1 View  Result Date: 01/26/2021 CLINICAL DATA:  Shortness of breath, abnormal chest x-ray EXAM: PORTABLE CHEST 1 VIEW COMPARISON:  01/24/2021 FINDINGS: Right Port-A-Cath remains in place, unchanged. Low lung volumes with bilateral airspace opacities, most pronounced in the left perihilar and lower lobe, unchanged since prior study. No visible effusions or pneumothorax. Heart is normal size. IMPRESSION: Low lung volumes with bilateral  airspace disease, left-greater-than-right. No significant change. Electronically Signed   By: Rolm Baptise M.D.   On: 01/26/2021 09:42   DG Chest Port 1 View  Result Date: 01/24/2021 CLINICAL DATA:  Follow-up multifocal pneumonia. EXAM: PORTABLE CHEST 1 VIEW COMPARISON:  01/22/2021 and older exams. FINDINGS: Mild interval decrease in the airspace lung opacities, most evident on the left. Lung volumes remain low. No new lung abnormalities. No visualized pleural effusion.  No pneumothorax. Right internal jugular Port-A-Cath is stable. IMPRESSION: 1. Mild interval improvement in lung aeration with decreased airspace lung opacities, most notably on the left. Electronically Signed   By: Lajean Manes M.D.   On: 01/24/2021 07:57   DG Chest Port 1 View  Result Date: 01/22/2021 CLINICAL DATA:  Hypoxia, shortness of breath. EXAM: PORTABLE CHEST 1 VIEW COMPARISON:  January 20, 2021. FINDINGS: Stable cardiomediastinal silhouette. Right internal jugular Port-A-Cath is unchanged in position. Hypoinflation of the lungs is noted. Stable bilateral lung opacities are noted, left greater than right, most consistent with multifocal pneumonia. Bony thorax is unremarkable. IMPRESSION: Hypoinflation of the lungs. Stable bilateral lung opacities are noted, left greater than right, most consistent with multifocal pneumonia. Electronically Signed   By: Marijo Conception M.D.   On: 01/22/2021 19:26   DG Chest Port 1 View  Result Date: 01/20/2021 CLINICAL DATA:  Pneumonitis EXAM: PORTABLE CHEST 1 VIEW COMPARISON:  01/19/2021 FINDINGS: Shallow inspiration. Patchy infiltrates in the left lung and  right lung base, similar to prior study and likely multifocal pneumonia. No pleural effusions. No pneumothorax. Mediastinal contours appear intact. Heart size is normal. Power port type central venous catheter on the right with tip over the cavoatrial junction region. Surgical clips in the right upper quadrant. IMPRESSION: Shallow  inspiration. Patchy infiltrates in both lungs, likely multifocal pneumonia, without change. Electronically Signed   By: Lucienne Capers M.D.   On: 01/20/2021 03:56   DG Chest Port 1 View  Result Date: 01/19/2021 CLINICAL DATA:  Abnormality on previous chest x-ray in a 60 year old male. EXAM: PORTABLE CHEST 1 VIEW COMPARISON:  CT from January 13, 2021, chest x-ray January 18, 2021. FINDINGS: RIGHT-sided Port-A-Cath terminates at the caval to atrial junction. Trachea is midline. Cardiomediastinal contours are stable. Interstitial and airspace opacities throughout the chest worse in the LEFT mid to upper chest, RIGHT upper chest lung bases bilaterally with similar appearance. On limited assessment there is no acute skeletal process. EKG leads project over the chest. IMPRESSION: No interval change in the appearance of the chest since previous imaging with continued decreased lung volumes and patchy bilateral opacities. Electronically Signed   By: Zetta Bills M.D.   On: 01/19/2021 07:59   DG Chest Port 1 View  Result Date: 01/18/2021 CLINICAL DATA:  Respiratory distress EXAM: PORTABLE CHEST 1 VIEW COMPARISON:  01/17/2021 FINDINGS: Cardiac shadow is stable. Right-sided chest wall port is again seen. Lungs are hypoinflated. Patchy airspace opacities again identified in both lungs left greater than right but stable from the prior exam. IMPRESSION: Patchy airspace opacities bilaterally stable from the prior exam. Electronically Signed   By: Inez Catalina M.D.   On: 01/18/2021 17:24   DG Chest Port 1 View  Result Date: 01/17/2021 CLINICAL DATA:  Worsening shortness of breath. EXAM: PORTABLE CHEST 1 VIEW COMPARISON:  01/14/2021 FINDINGS: 0826 hours. Low volume film. Patchy airspace disease again noted in both lungs, left greater than right. Cardiopericardial silhouette is at upper limits of normal for size. Right Port-A-Cath again noted. Telemetry leads overlie the chest. IMPRESSION: Low volume film with  patchy bilateral airspace disease, left greater than right. No substantial change. Electronically Signed   By: Misty Stanley M.D.   On: 01/17/2021 08:53   DG Chest Port 1 View  Result Date: 01/14/2021 CLINICAL DATA:  Shortness of breath.  History of cancer.  Diabetes. EXAM: PORTABLE CHEST 1 VIEW.  Patient is rotated. COMPARISON:  Chest x-ray 01/13/2021, CT chest 01/13/2021 FINDINGS: Accessed right chest wall Port-A-Cath in stable position. The heart and mediastinal contours are within normal limits. Low lung volumes with persistent diffuse patchy airspace opacities. No pleural effusion. No pneumothorax. No acute osseous abnormality. Right upper quadrant surgical clips. IMPRESSION: Low lung volumes with persistent diffuse patchy airspace opacities. Electronically Signed   By: Iven Finn M.D.   On: 01/14/2021 21:08   DG Chest Port 1 View  Result Date: 01/13/2021 CLINICAL DATA:  Shortness of breath EXAM: PORTABLE CHEST 1 VIEW COMPARISON:  Chest radiograph 07/29/2019 FINDINGS: There is a right chest wall port with the tip terminating in the lower SVC/cavoatrial junction. The cardiomediastinal silhouette is within normal limits. Lung volumes are low with unchanged asymmetric elevation of the right hemidiaphragm. There are increased interstitial markings with diffuse reticular opacities throughout both lungs. There is no focal consolidation. There is no significant pleural effusion. There is no pneumothorax. There is no acute osseous abnormality. There is gaseous distention of the stomach and large bowel in the right upper quadrant. IMPRESSION: Low  lung volumes with suspected mild pulmonary interstitial edema. Atypical/viral infection could have a similar appearance. Electronically Signed   By: Valetta Mole M.D.   On: 01/13/2021 14:24           ASSESSMENT/PLAN   Acute hypoxemic respiratory failure Due to acute exacerbation of interstitial lung disease with pulmonary fibrosis - present on  admission  - COVID19 negative   - supplemental O2 during my evaluation 10l/min>>8L>>HFNC  -Respiratory viral panel-negative  -serum fungitell-negative  -legionella ab-negative  -nasal MRSA PCR-negative -Procalcitonin trend reviewed -strep pneumoniae ur AG -Histoplasma Ur Ag-negative -sputum resp cultures -AFB sputum expectorated specimen -sputum cytology  -completed  Cefepime course for cap -Reduced Solumedrol to 40 BID - 01/15/21>>50 BID>>40 BID>>30 BID>>20 BID>>pred 50 -reviewed pertinent imaging with patient today - ESR and CRP are elevated but improving -PT/OT for d/c planning  -please encourage patient to use incentive spirometer few times each hour while hospitalized.   -continue Cellcept 250 bid >>500 bid >>750BID -UOP is adequate -Discussed goal of care - recommend DNR patient is in agreement but still discussing with wife, palliative care service to follow  -lasix 20 daily PO     Thank you for allowing me to participate in the care of this patient.   Patient/Family are satisfied with care plan and all questions have been answered.   This document was prepared using Dragon voice recognition software and may include unintentional dictation errors.     Ottie Glazier, M.D.  Division of [place]

## 2021-02-06 DIAGNOSIS — J9621 Acute and chronic respiratory failure with hypoxia: Secondary | ICD-10-CM | POA: Diagnosis not present

## 2021-02-06 DIAGNOSIS — C8290 Follicular lymphoma, unspecified, unspecified site: Secondary | ICD-10-CM | POA: Diagnosis not present

## 2021-02-06 LAB — C-REACTIVE PROTEIN: CRP: 2.7 mg/dL — ABNORMAL HIGH (ref <1.0)

## 2021-02-06 LAB — GLUCOSE, CAPILLARY
Glucose-Capillary: 106 mg/dL — ABNORMAL HIGH (ref 70–99)
Glucose-Capillary: 170 mg/dL — ABNORMAL HIGH (ref 70–99)
Glucose-Capillary: 502 mg/dL (ref 70–99)
Glucose-Capillary: 317 mg/dL — ABNORMAL HIGH (ref 70–99)
Glucose-Capillary: 231 mg/dL — ABNORMAL HIGH (ref 70–99)

## 2021-02-06 LAB — SEDIMENTATION RATE: Sed Rate: 67 mm/hr — ABNORMAL HIGH (ref 0–20)

## 2021-02-06 MED ORDER — METHYLPREDNISOLONE SODIUM SUCC 40 MG IJ SOLR
40.0000 mg | Freq: Two times a day (BID) | INTRAMUSCULAR | Status: DC
  Administered 2021-02-06 – 2021-02-12 (×13): 40 mg via INTRAVENOUS
  Filled 2021-02-06 (×13): qty 1

## 2021-02-06 MED ORDER — DILTIAZEM HCL ER COATED BEADS 120 MG PO CP24
120.0000 mg | ORAL_CAPSULE | Freq: Every day | ORAL | Status: DC
  Administered 2021-02-07 – 2021-02-19 (×13): 120 mg via ORAL
  Filled 2021-02-06 (×13): qty 1

## 2021-02-06 NOTE — Progress Notes (Signed)
Pulmonary Medicine          Date: 02/06/2021,   MRN# [id number] Edward Atkinson 1960-10-12     AdmissionWeight: 63.5 kg                 CurrentWeight: 53 kg   Referring physician: Dr Roderic Palau   CHIEF COMPLAINT:   Acute on chronic hypoxemic respiratory failure   HISTORY OF PRESENT ILLNESS   60 yo M w/hx of chronic hypoxemia and ILD post COVID19, T2DM, PE on eliquis, Lymphoma s/p Rituxan which is completed.  Came in due to worsening SOB and DOE specifically mild exertional defacation/urination with feelings of presyncope and severe dyspnea which started to get worse post tapering from steroid appx few days prior to onset of symptom progression.  He was placed on solumedrol and reported mild improvement.  He is on 10L /min [place] during my evaluation.  He had CT chest done with PE protocol and noted to have bilateral GGO infiltrates worse compared to June study. PCCM consultation for further evaluation and management.     01/17/21-  patient seems to be slighly clinically worse post reduction of IV steroids yesterday indicative of ongoing ILD exacerbation, thus far infectious workup is negative but inflammatory biomarkers are highly elevated also pointing towards ILD exacerbation.  There is new CXR in process this morning for interval changes. We discussed increasing steroids slightly and giving more time.  I will also initiate steroid sparing immunomodulation with BID cellcept as patient will likely need therapy for prolonged time period.  Compensatory tachycardia noted appreciate cardiology team.  Sugars with peaks and valleys with adjusted steroid therapy.  Patient with muscle wasting and protein calorie malnutrition but eating better this am.  01/18/21- patient is on HFNC at 60%, feels better, has not been able to tolerate BIPAP.  Wife is at bedside.  BP is borderline low MAP 76.  He has negative infectious workup and thus far ILD exacerbation seems to be main differential. He continues  to have peaks and valleys in blood glucose.  We started steroid sparing regimen with cellcept bid 250 mg and will increase dose as we taper off prednisone.  Remains tachyarrtythmic but improved. Appreciate everyone involved.     01/19/21- patient seen and examined at bedside.  Wife present during evaluation.  Ow HFNC weaned to 41%/45L/MIN.  CRP is still up, have increased cellcept and reducing solumedrol.  Patient felt stronger physically and was able to get OOB to chair. Cough is less now.  He is non-labored with breathing no accessory mm use today. He had dark stools with reduced hb, FOBT ordred.  Haptoglobin is elevated. Pathology slide review ordered.   01/20/21- patient is essentially unchanged from yesterday from pulmonary perspective. CXR also unchanged,  bloodwork with reduction in CRP.  Plan to wean O2 and continue OOB with PT/OT,  Continue current steroid dose and Cellcept.   01/21/21- patient is stable on HFNC.  He had small set back with increased O2 req after exerting himself in bathroom.  HFNC from 41/45 to 54/45.   He diuresed very well with lasix 20 bid >2L urine in past 24h.  Tachycardia is improved. He does not feel weaker and infact states he feels slowly improved.   01/22/21- I spoke to patient regarding goals of care and recommend DNR.  He is agreeable but I asked him to discuss with wife and family out of respect and we will revisit tommorow.  He states he is feeling better  and CRP is trending down but he remains on HFNC 60/40.  He was able to get OOB today and walked to bathroom but experienced mild desaturation with quick recovery "not much issues".      01/23/21- patient is still on high flow nasal canula , he is on 56%/45L.  We reviewed code status and he is still in discussion with family regarding DNR.   We discussed lung transplant per family request and this may be a future option if he is strong enough and recovers.  I have increased insluin TID to 20 from 15 and increased  bactrim to bid SS   01/24/21- patient weaned on HFNC to 49/45, he was able to use BIPAP overnight thinks its helping.  CXR this AM with interval improvement. Will keep on current dose of meds.  Repeat CRP in am.  Last BM yesterday well formed no blood, eating good, urine output very good on 20 bid lasix. Getting OOB with  transient desaturation.  Overall slight improvement.    01/25/21- patient has weaned down to 35%/40L/min.  He remains full code. Reviewed medical plan with Dr Jimmye Norman today.  Reduced steroids from 50 BID to 40 BID and also reduced insulin slightly.    01/26/21- patient is further weaned to 15L humidified HF nasal canula.  He did PT ROM exercises with mild desaturation. CXR today with expiratory image unable to see much interval changes. Patient reports improvement clinically. Plan to continue current regimen. Sugars slightly high due to steroids, will wean steroids soon. Continue cellcept as current 500 bid  01/27/21- patient is relatively same as yesterday, his O2 is weaned down to 14L.  He remains on IV solumedrol at 50 bid, we did discuss weaning this further to 40 BID. Will keep cellcept at current dose 500 bid.  His ankles have been down in seated position and are edematous on examination. He continues to participate with PT and feels good subjectively. We discussed starting dose of lasix for pedal edema.    01/28/21- patient examined at bedside, s/p PT/OT session with slight dyspnea.  He was weaned to 10L O2 but then went back up to 13 post PT due to increased WOB.  He has complete resolution of LE edema and appears to be euvolemic at this point. We discussed reducing soluemdrol today to 20 bid.  I reviewed plan with Dr Leslye Peer today.  01/29/21- patient examined at bedside.  He had a good day and feels better. We reviewed medical plan and are reducing steroids to PO prednisone starting at 51m daily.   01/30/21-  Patient seen and examined at bedside.  CXR this am with worsening  pulmonary edema, included below.  Vitals stable with mild tachypnea and tachycardia with activity. Will deliver 1 dose IV lasix 20 today. Plan to continue Pred PO and cellcept at 500 bid. Pulmonary intensive PT with IS and will initiate Metaneb therapy with albuterol. Blood work today will be repeated CRP CMP phos procal ESR.  Patient is agreeable to BIPAP QHS and Metaneb BID.   01/31/21-  patient had set back yesterday with mild flash pulm edema on CXR. He diuresed very well overnight and feels back to where he was at previously.  He had transient reactive leukocytosis as well.  We initiated empirically zosyn and zithromax on top of bid bactrim.  This probably could be dcd since he recovered so quickly which would not occur with pneumonia.  Ill go head and dc this today but continue PO bactrim.  Will stay on pred 50 today with cellcept at 500 bid as previous.  His O2 has been reduced from 95 to 70% overnight and hes working with PT today. Hopefully we can wean this back down to 10L as he was previosly then back to his home setting.   02/01/21- patient is somewhat better today, he is being weaned on O2 and is now down to 40%. He had BM today. Leaving prednisone at current dose and cxr today.   02/02/21- patient is now on 15L HF bubbler.  He reports feeling better.  He had 3 bowel movements after stool softner. He is working with PT/OT.  His long term prognosis is poor unfortunately and we appreciate collaboration with Palliative care team.   02/03/21- patient had over exerted himself again and required increased O2. He states he feels better and just did more then he should have with PT.  His wbc count is trending down. His CRP is trending down.  I have started morhpine for air hunger and pain at sacrum for skin breakdown and pressure ulceration.     02/04/21- patient is unchanged.  He wants to keep fighting to improve.  CRP is trending down again.  ESR is upward trending. He was unable to perform adequately  with PT and had severe dyspnea and desaturation just to get OOB to chair. Discussed case with [employer] to see if he can have HFNC at home but the device has been recalled. Discussed patient with Dr Roosevelt Locks plan is for additional diuresis.  I have increased cellcept to 750 BID today  02/05/21- patient with UOP >2L yesterday, his spO2 req remains HFNC 35/40.  He feels well and wants to keep working to be discharged to rehab. We have ordered NIV for him.  Patient has pulmonary fibrosis with restrictive lung disease and would benefit from device such as Trilogy 100/200 with O2 bleed in.  His overall prognosis is poor.   02/06/21- patient is back up to 50%/45L.  He clinically feels well but just not able to get off the oxygen or improve as well as we had hoped.  I had lengthy discussion with patient he wishes to continue current inpatient therapy and wants to be more aggressive medically.  I have increased steroids to solumedrol 40 bid again.     PAST MEDICAL HISTORY   Past Medical History:  Diagnosis Date  . Chronic respiratory failure with hypoxia (HCC)    2L [place] continuous  . COVID-19   . Diabetes mellitus without complication ([place])   . Diverticulitis   . Follicular lymphoma of intra-abdominal lymph nodes ([place]) 02/15/2019  . ILD (interstitial lung disease) ([place])      SURGICAL HISTORY   Past Surgical History:  Procedure Laterality Date  . CHOLECYSTECTOMY    . COLON RESECTION     DUE TO DIVERTICULITIS  . FLEXIBLE BRONCHOSCOPY N/A 12/02/2019   Procedure: FLEXIBLE BRONCHOSCOPY;  Surgeon: Ottie Glazier, MD;  Location: [facility];  Service: Thoracic;  Laterality: N/A;  . PORTA CATH INSERTION N/A 03/11/2019   Procedure: PORTA CATH INSERTION;  Surgeon: Algernon Huxley, MD;  Location: [facility];  Service: Cardiovascular;  Laterality: N/A;  . PULMONARY THROMBECTOMY N/A 01/29/2019   Procedure: PULMONARY THROMBECTOMY;  Surgeon: Algernon Huxley, MD;  Location: [facility];  Service:  Cardiovascular;  Laterality: N/A;     FAMILY HISTORY   Family History  Problem Relation Age of Onset  . Diabetes Brother      SOCIAL  HISTORY   Social History   Tobacco Use  . Smoking status: Never  . Smokeless tobacco: Never  Vaping Use  . Vaping Use: Never used  Substance Use Topics  . Alcohol use: Yes    Alcohol/week: 0.0 - 2.0 standard drinks    Comment: occasional  . Drug use: Never     MEDICATIONS    Home Medication:    Current Medication:  Current Facility-Administered Medications:  .  acetaminophen (TYLENOL) tablet 650 mg, 650 mg, Oral, Q6H PRN, 650 mg at 02/05/21 0749 **OR** acetaminophen (TYLENOL) suppository 650 mg, 650 mg, Rectal, Q6H PRN, Howerter, Justin B, DO .  ALPRAZolam (XANAX) tablet 0.25 mg, 0.25 mg, Oral, QHS PRN, Sharen Hones, MD .  ALPRAZolam Duanne Moron) tablet 0.25 mg, 0.25 mg, Oral, q morning, Sharen Hones, MD, 0.25 mg at 02/05/21 0845 .  apixaban (ELIQUIS) tablet 5 mg, 5 mg, Oral, BID, Beers, Shanon Brow, RPH, 5 mg at 02/05/21 2152 .  Chlorhexidine Gluconate Cloth 2 % PADS 6 each, 6 each, Topical, Daily, Kathie Dike, MD, 6 each at 02/05/21 1156 .  digoxin (LANOXIN) tablet 0.125 mg, 0.125 mg, Oral, Daily, Memon, Jehanzeb, MD, 0.125 mg at 02/05/21 0845 .  diltiazem (CARDIZEM CD) 24 hr capsule 180 mg, 180 mg, Oral, Daily, Paraschos, Alexander, MD, 180 mg at 02/05/21 0845 .  diltiazem (CARDIZEM) injection 10 mg, 10 mg, Intravenous, Q6H PRN, Wyvonnia Dusky, MD, 10 mg at 01/24/21 2328 .  docusate sodium (COLACE) capsule 200 mg, 200 mg, Oral, BID, Wyvonnia Dusky, MD, 200 mg at 02/05/21 2152 .  feeding supplement (NEPRO CARB STEADY) liquid 237 mL, 237 mL, Oral, TID BM, Memon, Jehanzeb, MD, Last Rate: 0 mL/hr at 01/21/21 2130, 237 mL at 02/05/21 2002 .  furosemide (LASIX) injection 20 mg, 20 mg, Intravenous, Daily, [place] Gins, Kriss Perleberg, MD, 20 mg at 02/05/21 0845 .  guaiFENesin (MUCINEX) 12 hr tablet 1,200 mg, 1,200 mg, Oral, BID, Kathie Dike,  MD, 1,200 mg at 02/05/21 2152 .  guaiFENesin-dextromethorphan (ROBITUSSIN DM) 100-10 MG/5ML syrup 5 mL, 5 mL, Oral, Q4H PRN, Kathie Dike, MD, 5 mL at 02/04/21 0850 .  insulin aspart (novoLOG) injection 0-20 Units, 0-20 Units, Subcutaneous, TID WC, Kathie Dike, MD, 7 Units at 02/05/21 1557 .  insulin aspart (novoLOG) injection 0-5 Units, 0-5 Units, Subcutaneous, QHS, Kathie Dike, MD, 3 Units at 02/05/21 2154 .  insulin aspart (novoLOG) injection 10 Units, 10 Units, Subcutaneous, TID WC, Loletha Grayer, MD, 10 Units at 02/05/21 1557 .  insulin glargine-yfgn (SEMGLEE) injection 9 Units, 9 Units, Subcutaneous, QHS, Loletha Grayer, MD, 9 Units at 02/05/21 2154 .  lactulose (CHRONULAC) 10 GM/15ML solution 30 g, 30 g, Oral, BID PRN, Leslye Peer, Richard, MD .  levalbuterol [facility]) nebulizer solution 1.25 mg, 1.25 mg, Nebulization, Q4H PRN, Howerter, Justin B, DO, 1.25 mg at 01/17/21 2011 .  morphine 2 MG/ML injection 1 mg, 1 mg, Intravenous, Q3H PRN, Ottie Glazier, MD, 1 mg at 02/05/21 1315 .  multivitamin with minerals tablet 1 tablet, 1 tablet, Oral, Daily, Kathie Dike, MD, 1 tablet at 02/05/21 1155 .  mycophenolate (CELLCEPT) capsule 750 mg, 750 mg, Oral, BID, [place] Gins, Nameer Summer, MD, 750 mg at 02/05/21 2153 .  polyethylene glycol (MIRALAX / GLYCOLAX) packet 17 g, 17 g, Oral, Daily, Loletha Grayer, MD, 17 g at 02/01/21 0937 .  predniSONE (DELTASONE) tablet 50 mg, 50 mg, Oral, Q breakfast, Anzleigh Slaven, MD, 50 mg at 02/06/21 0839 .  simethicone (MYLICON) chewable tablet 80 mg, 80 mg, Oral, QID PRN, Leslye Peer, Richard,  MD, 80 mg at 01/29/21 1411 .  sodium chloride (OCEAN) 0.65 % nasal spray 1 spray, 1 spray, Each Nare, PRN, Loletha Grayer, MD, 1 spray at 01/30/21 2142 .  sodium chloride flush (NS) 0.9 % injection 10-40 mL, 10-40 mL, Intracatheter, Q12H, Memon, Jolaine Artist, MD, 10 mL at 02/05/21 2152 .  sodium chloride flush (NS) 0.9 % injection 10-40 mL, 10-40 mL, Intracatheter, PRN,  Kathie Dike, MD, 10 mL at 01/24/21 0915 .  sulfamethoxazole-trimethoprim (BACTRIM) 400-80 MG per tablet 1 tablet, 1 tablet, Oral, Q12H, Ottie Glazier, MD, 1 tablet at 02/05/21 2152 .  traZODone (DESYREL) tablet 50 mg, 50 mg, Oral, QHS PRN, Sharion Settler, NP, 50 mg at 02/03/21 2137    ALLERGIES   Penicillins     REVIEW OF SYSTEMS    Review of Systems:  Gen:  Denies  fever, sweats, chills weigh loss  HEENT: Denies blurred vision, double vision, ear pain, eye pain, hearing loss, nose bleeds, sore throat Cardiac:  No dizziness, chest pain or heaviness, chest tightness,edema Resp:  admits to severe dyspnea Gi: Denies swallowing difficulty, stomach pain, nausea or vomiting, diarrhea, constipation, bowel incontinence Gu:  Denies bladder incontinence, burning urine Ext:   Denies Joint pain, stiffness or swelling Skin: Denies  skin rash, easy bruising or bleeding or hives Endoc:  Denies polyuria, polydipsia , polyphagia or weight change Psych:   admits to axniety  Other:  All other systems negative   VS: BP 136/78 (BP Location: Right Arm)   Pulse 75   Temp 98.5 F (36.9 C) (Oral)   Resp 20   Ht 6' (1.829 m)   Wt 67 kg   SpO2 100%   BMI 20.03 kg/m      PHYSICAL EXAM    GENERAL:NAD, no fevers, chills, no weakness no fatigue HEAD: Normocephalic, atraumatic.  EYES: Pupils equal, round, reactive to light. Extraocular muscles intact. No scleral icterus.  MOUTH: Moist mucosal membrane. Dentition intact. No abscess noted.  EAR, NOSE, THROAT: Clear without exudates. No external lesions.  NECK: Supple. No thyromegaly. No nodules. No JVD.  PULMONARY: Bilateral rhonchi improved from previous CARDIOVASCULAR: S1 and S2. Regular rate and rhythm. No murmurs, rubs, or gallops. No edema. Pedal pulses 2+ bilaterally.  GASTROINTESTINAL: Soft, nontender, nondistended. No masses. Positive bowel sounds. No hepatosplenomegaly.  MUSCULOSKELETAL: No swelling, clubbing, or edema. Range  of motion full in all extremities.  NEUROLOGIC: Cranial nerves II through XII are intact. No gross focal neurological deficits. Sensation intact. Reflexes intact.  SKIN: No ulceration, lesions, rashes, or cyanosis. Skin warm and dry. Turgor intact.  PSYCHIATRIC: Mood, affect within normal limits. The patient is awake, alert and oriented x 3. Insight, judgment intact.       IMAGING    CT Angio Chest PE W/Cm &/Or Wo Cm  Result Date: 01/13/2021 CLINICAL DATA:  Respiratory distress, hypoxia EXAM: CT ANGIOGRAPHY CHEST WITH CONTRAST TECHNIQUE: Multidetector CT imaging of the chest was performed using the standard protocol during bolus administration of intravenous contrast. Multiplanar CT image reconstructions and MIPs were obtained to evaluate the vascular anatomy. CONTRAST:  65m OMNIPAQUE IOHEXOL 350 MG/ML SOLN COMPARISON:  10/26/2020, 01/13/2021 FINDINGS: Cardiovascular: This is a technically adequate evaluation of the pulmonary vasculature. No filling defects or pulmonary emboli. The heart is unremarkable without pericardial effusion. No evidence of thoracic aortic aneurysm or dissection. Right chest wall port via internal jugular approach tip within the superior vena cava. Mediastinum/Nodes: No pathologic adenopathy within the mediastinum, hila, or axilla. Thyroid, trachea, and esophagus are unremarkable. Lungs/Pleura:  There has been interval progression of the multifocal ground-glass airspace disease seen previously. Changes are most pronounced within the dependent lower lobes. No effusion or pneumothorax. The central airways are patent, with persistent bronchiectasis. Upper Abdomen: No acute abnormality. Musculoskeletal: No acute or destructive bony lesions. Reconstructed images demonstrate no additional findings. Review of the MIP images confirms the above findings. IMPRESSION: 1. No evidence of pulmonary embolus. 2. Progressive multifocal bilateral ground-glass airspace disease, greatest at the lung  bases. The differential diagnosis would include progressive postinflammatory scarring and fibrosis, multifocal atypical pneumonia, or hypersensitivity/drug toxicity. 3. Stable bronchiectasis. Electronically Signed   By: Randa Ngo M.D.   On: 01/13/2021 17:13   DG Chest Port 1 View  Result Date: 02/05/2021 CLINICAL DATA:  Bilateral pulmonary infiltrates on chest x-ray. EXAM: PORTABLE CHEST 1 VIEW COMPARISON:  Chest radiograph 02/01/2021 FINDINGS: Power injectable right IJ port central venous catheter tip projects at the level of the superior cavoatrial junction. Unchanged low lung volumes with lower lobe predominant interstitial opacities, left greater than right. No new focal consolidation, pleural effusion, or pneumothorax. Heart is normal in size. Surgical clips in the right upper abdomen. IMPRESSION: Bilateral interstitial airspace opacities are unchanged compared to 02/01/2021. No new focal consolidation, pleural effusion, or pneumothorax. Electronically Signed   By: Ileana Roup M.D.   On: 02/05/2021 10:59   DG Chest Port 1 View  Result Date: 02/01/2021 CLINICAL DATA:  Shortness of breath, edema EXAM: PORTABLE CHEST 1 VIEW COMPARISON:  01/30/2021 FINDINGS: Stable positioning of right IJ approach Port-A-Cath. Heart size within normal limits. Low lung volumes. Slight interval worsening of bilateral airspace opacities, left worse than right. No pleural effusion or pneumothorax. IMPRESSION: Slight interval worsening of bilateral airspace opacities, left worse than right. Electronically Signed   By: Davina Poke D.O.   On: 02/01/2021 14:37   DG Chest Port 1 View  Result Date: 01/30/2021 CLINICAL DATA:  Acute on chronic hypoxemic respiratory failure. EXAM: PORTABLE CHEST 1 VIEW COMPARISON:  January 28, 2021 FINDINGS: No pneumothorax. Stable cardiomediastinal silhouette and right Port-A-Cath. Left-greater-than-right pulmonary opacities/infiltrates. No other interval changes. IMPRESSION: 1.  Persistent low lung volumes and bilateral pulmonary infiltrates. No interval changes. Electronically Signed   By: Dorise Bullion III M.D.   On: 01/30/2021 08:36   DG Chest Port 1 View  Result Date: 01/28/2021 CLINICAL DATA:  Shortness of breath. EXAM: PORTABLE CHEST 1 VIEW COMPARISON:  Multiple recent chest films. FINDINGS: The right IJ power port is stable. Persistent low lung volumes and bilateral pulmonary infiltrates. No definite pleural effusion or pneumothorax. IMPRESSION: Persistent low lung volumes and bilateral pulmonary infiltrates. Electronically Signed   By: Marijo Sanes M.D.   On: 01/28/2021 16:03   DG Chest Port 1 View  Result Date: 01/26/2021 CLINICAL DATA:  Shortness of breath, abnormal chest x-ray EXAM: PORTABLE CHEST 1 VIEW COMPARISON:  01/24/2021 FINDINGS: Right Port-A-Cath remains in place, unchanged. Low lung volumes with bilateral airspace opacities, most pronounced in the left perihilar and lower lobe, unchanged since prior study. No visible effusions or pneumothorax. Heart is normal size. IMPRESSION: Low lung volumes with bilateral airspace disease, left-greater-than-right. No significant change. Electronically Signed   By: Rolm Baptise M.D.   On: 01/26/2021 09:42   DG Chest Port 1 View  Result Date: 01/24/2021 CLINICAL DATA:  Follow-up multifocal pneumonia. EXAM: PORTABLE CHEST 1 VIEW COMPARISON:  01/22/2021 and older exams. FINDINGS: Mild interval decrease in the airspace lung opacities, most evident on the left. Lung volumes remain low. No new lung  abnormalities. No visualized pleural effusion.  No pneumothorax. Right internal jugular Port-A-Cath is stable. IMPRESSION: 1. Mild interval improvement in lung aeration with decreased airspace lung opacities, most notably on the left. Electronically Signed   By: Lajean Manes M.D.   On: 01/24/2021 07:57   DG Chest Port 1 View  Result Date: 01/22/2021 CLINICAL DATA:  Hypoxia, shortness of breath. EXAM: PORTABLE CHEST 1 VIEW  COMPARISON:  January 20, 2021. FINDINGS: Stable cardiomediastinal silhouette. Right internal jugular Port-A-Cath is unchanged in position. Hypoinflation of the lungs is noted. Stable bilateral lung opacities are noted, left greater than right, most consistent with multifocal pneumonia. Bony thorax is unremarkable. IMPRESSION: Hypoinflation of the lungs. Stable bilateral lung opacities are noted, left greater than right, most consistent with multifocal pneumonia. Electronically Signed   By: Marijo Conception M.D.   On: 01/22/2021 19:26   DG Chest Port 1 View  Result Date: 01/20/2021 CLINICAL DATA:  Pneumonitis EXAM: PORTABLE CHEST 1 VIEW COMPARISON:  01/19/2021 FINDINGS: Shallow inspiration. Patchy infiltrates in the left lung and right lung base, similar to prior study and likely multifocal pneumonia. No pleural effusions. No pneumothorax. Mediastinal contours appear intact. Heart size is normal. Power port type central venous catheter on the right with tip over the cavoatrial junction region. Surgical clips in the right upper quadrant. IMPRESSION: Shallow inspiration. Patchy infiltrates in both lungs, likely multifocal pneumonia, without change. Electronically Signed   By: Lucienne Capers M.D.   On: 01/20/2021 03:56   DG Chest Port 1 View  Result Date: 01/19/2021 CLINICAL DATA:  Abnormality on previous chest x-ray in a 60 year old male. EXAM: PORTABLE CHEST 1 VIEW COMPARISON:  CT from January 13, 2021, chest x-ray January 18, 2021. FINDINGS: RIGHT-sided Port-A-Cath terminates at the caval to atrial junction. Trachea is midline. Cardiomediastinal contours are stable. Interstitial and airspace opacities throughout the chest worse in the LEFT mid to upper chest, RIGHT upper chest lung bases bilaterally with similar appearance. On limited assessment there is no acute skeletal process. EKG leads project over the chest. IMPRESSION: No interval change in the appearance of the chest since previous imaging with  continued decreased lung volumes and patchy bilateral opacities. Electronically Signed   By: Zetta Bills M.D.   On: 01/19/2021 07:59   DG Chest Port 1 View  Result Date: 01/18/2021 CLINICAL DATA:  Respiratory distress EXAM: PORTABLE CHEST 1 VIEW COMPARISON:  01/17/2021 FINDINGS: Cardiac shadow is stable. Right-sided chest wall port is again seen. Lungs are hypoinflated. Patchy airspace opacities again identified in both lungs left greater than right but stable from the prior exam. IMPRESSION: Patchy airspace opacities bilaterally stable from the prior exam. Electronically Signed   By: Inez Catalina M.D.   On: 01/18/2021 17:24   DG Chest Port 1 View  Result Date: 01/17/2021 CLINICAL DATA:  Worsening shortness of breath. EXAM: PORTABLE CHEST 1 VIEW COMPARISON:  01/14/2021 FINDINGS: 0826 hours. Low volume film. Patchy airspace disease again noted in both lungs, left greater than right. Cardiopericardial silhouette is at upper limits of normal for size. Right Port-A-Cath again noted. Telemetry leads overlie the chest. IMPRESSION: Low volume film with patchy bilateral airspace disease, left greater than right. No substantial change. Electronically Signed   By: Misty Stanley M.D.   On: 01/17/2021 08:53   DG Chest Port 1 View  Result Date: 01/14/2021 CLINICAL DATA:  Shortness of breath.  History of cancer.  Diabetes. EXAM: PORTABLE CHEST 1 VIEW.  Patient is rotated. COMPARISON:  Chest x-ray 01/13/2021, CT chest  01/13/2021 FINDINGS: Accessed right chest wall Port-A-Cath in stable position. The heart and mediastinal contours are within normal limits. Low lung volumes with persistent diffuse patchy airspace opacities. No pleural effusion. No pneumothorax. No acute osseous abnormality. Right upper quadrant surgical clips. IMPRESSION: Low lung volumes with persistent diffuse patchy airspace opacities. Electronically Signed   By: Iven Finn M.D.   On: 01/14/2021 21:08   DG Chest Port 1 View  Result Date:  01/13/2021 CLINICAL DATA:  Shortness of breath EXAM: PORTABLE CHEST 1 VIEW COMPARISON:  Chest radiograph 07/29/2019 FINDINGS: There is a right chest wall port with the tip terminating in the lower SVC/cavoatrial junction. The cardiomediastinal silhouette is within normal limits. Lung volumes are low with unchanged asymmetric elevation of the right hemidiaphragm. There are increased interstitial markings with diffuse reticular opacities throughout both lungs. There is no focal consolidation. There is no significant pleural effusion. There is no pneumothorax. There is no acute osseous abnormality. There is gaseous distention of the stomach and large bowel in the right upper quadrant. IMPRESSION: Low lung volumes with suspected mild pulmonary interstitial edema. Atypical/viral infection could have a similar appearance. Electronically Signed   By: Valetta Mole M.D.   On: 01/13/2021 14:24           ASSESSMENT/PLAN   Acute hypoxemic respiratory failure Due to acute exacerbation of interstitial lung disease with pulmonary fibrosis - present on admission  - COVID19 negative   - supplemental O2 during my evaluation 10l/min>>8L>>HFNC  -Respiratory viral panel-negative  -serum fungitell-negative  -legionella ab-negative  -nasal MRSA PCR-negative -Procalcitonin trend reviewed -strep pneumoniae ur AG -Histoplasma Ur Ag-negative -sputum resp cultures -AFB sputum expectorated specimen -sputum cytology  -completed  Cefepime course for cap -Reduced Solumedrol to 40 BID - 01/15/21>>50 BID>>40 BID>>30 BID>>20 BID>>pred 50>>soluemdrol 40 iv bid please watch surgars -reviewed pertinent imaging with patient today - ESR and CRP are elevated but improving -PT/OT for d/c planning  -please encourage patient to use incentive spirometer few times each hour while hospitalized.   -continue Cellcept 250 bid >>500 bid >>750BID -UOP is adequate -Discussed goal of care - recommend DNR patient is in agreement but  still discussing with wife, palliative care service to follow  -lasix 20 daily PO     Thank you for allowing me to participate in the care of this patient.   Patient/Family are satisfied with care plan and all questions have been answered.   This document was prepared using Dragon voice recognition software and may include unintentional dictation errors.     Ottie Glazier, M.D.  Division of [facility]

## 2021-02-06 NOTE — Progress Notes (Signed)
PROGRESS NOTE    Edward Atkinson  XLK:[id number] DOB: February 16, 1961 DOA: 01/13/2021 PCP: Rusty Aus, MD   Chief complaint.  Shortness of breath. Brief Narrative:  60 year old man with history of follicular lymphoma, [id number] infection, interstitial lung disease and chronic respiratory failure, and type 2 diabetes mellitus.  Patient was admitted 20 days ago with acute hypoxic respiratory failure and interstitial lung disease exacerbation.  Patient had a CT scan of the chest that did not show any pulmonary embolism.  The patient was on heated high flow nasal cannula during the course and then was tapered over to bubble high flow nasal cannula.  Patient condition appears to be fluctuating, currently, he is back on 45% of heated high flow again today.  Has completed a course of antibiotics.  He is still on steroids.  He has been followed by pulmonology   Assessment & Plan:   Principal Problem:   Acute on chronic respiratory failure with hypoxia ([place]) Active Problems:   Type 2 diabetes mellitus with hypoglycemia without coma, without long-term current use of insulin (HCC)   Follicular lymphoma (HCC)   SOB (shortness of breath)   Interstitial lung disease (HCC)   Severe sepsis (HCC)   CAP (community acquired pneumonia)   Elevated troponin   Protein calorie malnutrition (HCC)   Protein-calorie malnutrition, severe   AF (paroxysmal atrial fibrillation) (HCC)   Thrombocytosis   Leukocytosis   Pressure injury of skin   Hyponatremia   Acute on chronic hypoxemic respiratory failure. Interstitial lung disease exacerbation Patient was placed on 100% of heated high flow last night, currently on 50% of oxygen. Continue steroids, CellCept, Bactrim and acyclovir.    Severe protein calorie malnutrition. Continue protein supplement for  Paroxysmal atrial fibrillation. Continue Eliquis.  Follicular lymphoma.     DVT prophylaxis: Eliquis Code Status: full Family Communication:   Disposition Plan:      Status is: Inpatient   Remains inpatient appropriate because:Inpatient level of care appropriate due to severity of illness, patient has severe acute respite failure on  high flow oxygen.   Dispo: The patient is from: Home              Anticipated d/c is to: Home              Patient currently is not medically stable to d/c.              Difficult to place patient No       I/O last 3 completed shifts: In: 1080 [P.O.:1080] Out: 2250 [Urine:2250] Total I/O In: 480 [P.O.:480] Out: 200 [Urine:200]    Consultants:  Pulmonology   Procedures: None   Antimicrobials: None      Subjective: Patient appeared to have worsening hypoxemia last night, he was briefly on 100% oxygen with heated high flow.  Currently down to 50%.  Patient did not feel short of breath.  He has a cough nonproductive. No fever or chills. No dysuria hematuria pain No abdominal pain nausea vomiting, he he has good appetite.  Objective: Vitals:   02/06/21 0803 02/06/21 0807 02/06/21 0814 02/06/21 0954  BP:  136/78    Pulse:  75  89  Resp:  20    Temp:  98.5 F (36.9 C)    TempSrc:  Oral    SpO2: 100% 100% 100%   Weight:      Height:        Intake/Output Summary (Last 24 hours) at 02/06/2021 1104 Last data filed at 02/06/2021 0930  Gross per 24 hour  Intake 960 ml  Output 1150 ml  Net -190 ml   Filed Weights   02/03/21 0404 02/04/21 0500 02/06/21 0500  Weight: 67.2 kg 68.6 kg 67 kg    Examination:  General exam: Appears calm and comfortable, severely malnourished Respiratory system: Crackles in bilateral lungs. Respiratory effort normal. Cardiovascular system: S1 & S2 heard, RRR. No JVD, murmurs, rubs, gallops or clicks. No pedal edema. Gastrointestinal system: Abdomen is nondistended, soft and nontender. No organomegaly or masses felt. Normal bowel sounds heard. Central nervous system: Alert and oriented. No focal neurological deficits. Extremities: Muscle  atrophy Skin: No rashes, lesions or ulcers Psychiatry: Judgement and insight appear normal. Mood & affect appropriate.     Data Reviewed: I have personally reviewed following labs and imaging studies  CBC: Recent Labs  Lab 01/30/21 1125 01/31/21 0600 02/01/21 0605 02/03/21 0530  WBC 39.5* 18.3* 15.2* 11.4*  NEUTROABS 34.4*  --  13.3*  --   HGB 12.2* 10.3* 10.2* 10.6*  HCT 36.2* 30.2* 30.2* 30.8*  MCV 84.2 83.7 83.7 83.0  PLT 319 182 179 841   Basic Metabolic Panel: Recent Labs  Lab 01/30/21 1125 01/31/21 0600 02/01/21 0605 02/04/21 0500  NA 131* 134* 134* 134*  K 3.9 3.8 3.8 3.9  CL 93* 92* 95* 95*  CO2 25 33* 36* 34*  GLUCOSE 163* 130* 127* 146*  BUN 22 18 18 17   CREATININE 0.71 0.58* 0.44* 0.50*  CALCIUM 8.0* 8.0* 8.0* 7.8*  MG  --   --   --  1.9  PHOS 2.9  --   --   --    GFR: Estimated Creatinine Clearance: 90.7 mL/min (A) (by C-G formula based on SCr of 0.5 mg/dL (L)). Liver Function Tests: Recent Labs  Lab 01/30/21 1125 02/01/21 0605  AST 38 19  ALT 38 28  ALKPHOS 70 56  BILITOT 0.9 0.7  PROT 4.8* 4.5*  ALBUMIN 2.7* 2.3*   No results for input(s): LIPASE, AMYLASE in the last 168 hours. No results for input(s): AMMONIA in the last 168 hours. Coagulation Profile: No results for input(s): INR, PROTIME in the last 168 hours. Cardiac Enzymes: No results for input(s): CKTOTAL, CKMB, CKMBINDEX, TROPONINI in the last 168 hours. BNP (last 3 results) No results for input(s): PROBNP in the last 8760 hours. HbA1C: No results for input(s): HGBA1C in the last 72 hours. CBG: Recent Labs  Lab 02/05/21 0744 02/05/21 1121 02/05/21 1526 02/05/21 2117 02/06/21 0806  GLUCAP 93 245* 206* 263* 106*   Lipid Profile: No results for input(s): CHOL, HDL, LDLCALC, TRIG, CHOLHDL, LDLDIRECT in the last 72 hours. Thyroid Function Tests: No results for input(s): TSH, T4TOTAL, FREET4, T3FREE, THYROIDAB in the last 72 hours. Anemia Panel: No results for input(s):  VITAMINB12, FOLATE, FERRITIN, TIBC, IRON, RETICCTPCT in the last 72 hours. Sepsis Labs: Recent Labs  Lab 01/30/21 1125 02/01/21 0605  PROCALCITON <0.10 <0.10    No results found for this or any previous visit (from the past 240 hour(s)).       Radiology Studies: DG Chest Port 1 View  Result Date: 02/05/2021 CLINICAL DATA:  Bilateral pulmonary infiltrates on chest x-ray. EXAM: PORTABLE CHEST 1 VIEW COMPARISON:  Chest radiograph 02/01/2021 FINDINGS: Power injectable right IJ port central venous catheter tip projects at the level of the superior cavoatrial junction. Unchanged low lung volumes with lower lobe predominant interstitial opacities, left greater than right. No new focal consolidation, pleural effusion, or pneumothorax. Heart is normal in size. Surgical  clips in the right upper abdomen. IMPRESSION: Bilateral interstitial airspace opacities are unchanged compared to 02/01/2021. No new focal consolidation, pleural effusion, or pneumothorax. Electronically Signed   By: Ileana Roup M.D.   On: 02/05/2021 10:59        Scheduled Meds:  ALPRAZolam  0.25 mg Oral q morning   apixaban  5 mg Oral BID   Chlorhexidine Gluconate Cloth  6 each Topical Daily   digoxin  0.125 mg Oral Daily   diltiazem  180 mg Oral Daily   docusate sodium  200 mg Oral BID   feeding supplement (NEPRO CARB STEADY)  237 mL Oral TID BM   furosemide  20 mg Intravenous Daily   guaiFENesin  1,200 mg Oral BID   insulin aspart  0-20 Units Subcutaneous TID WC   insulin aspart  0-5 Units Subcutaneous QHS   insulin aspart  10 Units Subcutaneous TID WC   insulin glargine-yfgn  9 Units Subcutaneous QHS   multivitamin with minerals  1 tablet Oral Daily   mycophenolate  750 mg Oral BID   polyethylene glycol  17 g Oral Daily   predniSONE  50 mg Oral Q breakfast   sodium chloride flush  10-40 mL Intracatheter Q12H   sulfamethoxazole-trimethoprim  1 tablet Oral Q12H   Continuous Infusions:   LOS: 24 days    Time  spent: 25 minutes    Sharen Hones, MD [facility]   To contact the attending provider between 7A-7P or the covering provider during after hours 7P-7A, please log into the web site [email or web address] and access using universal [place] password for that web site. If you do not have the password, please call the hospital operator.  02/06/2021, 11:04 AM

## 2021-02-07 ENCOUNTER — Inpatient Hospital Stay: Payer: BC Managed Care – PPO

## 2021-02-07 DIAGNOSIS — E1165 Type 2 diabetes mellitus with hyperglycemia: Secondary | ICD-10-CM

## 2021-02-07 DIAGNOSIS — C8290 Follicular lymphoma, unspecified, unspecified site: Secondary | ICD-10-CM | POA: Diagnosis not present

## 2021-02-07 DIAGNOSIS — J9621 Acute and chronic respiratory failure with hypoxia: Secondary | ICD-10-CM | POA: Diagnosis not present

## 2021-02-07 LAB — SEDIMENTATION RATE: Sed Rate: 65 mm/hr — ABNORMAL HIGH (ref 0–20)

## 2021-02-07 LAB — GLUCOSE, CAPILLARY
Glucose-Capillary: 256 mg/dL — ABNORMAL HIGH (ref 70–99)
Glucose-Capillary: 233 mg/dL — ABNORMAL HIGH (ref 70–99)
Glucose-Capillary: 110 mg/dL — ABNORMAL HIGH (ref 70–99)
Glucose-Capillary: 283 mg/dL — ABNORMAL HIGH (ref 70–99)

## 2021-02-07 LAB — C-REACTIVE PROTEIN: CRP: 3.8 mg/dL — ABNORMAL HIGH (ref <1.0)

## 2021-02-07 MED ORDER — IPRATROPIUM-ALBUTEROL 0.5-2.5 (3) MG/3ML IN SOLN
3.0000 mL | Freq: Four times a day (QID) | RESPIRATORY_TRACT | Status: DC
  Administered 2021-02-07 – 2021-02-11 (×15): 3 mL via RESPIRATORY_TRACT
  Filled 2021-02-07 (×15): qty 3

## 2021-02-07 MED ORDER — INSULIN GLARGINE-YFGN 100 UNIT/ML ~~LOC~~ SOLN
9.0000 [IU] | Freq: Two times a day (BID) | SUBCUTANEOUS | Status: DC
  Administered 2021-02-07 – 2021-02-08 (×4): 9 [IU] via SUBCUTANEOUS
  Filled 2021-02-07 (×7): qty 0.09

## 2021-02-07 NOTE — Progress Notes (Addendum)
Pulmonary Medicine          Date: 02/07/2021,   MRN# [id number] Edward Atkinson 1960-07-27     AdmissionWeight: 63.5 kg                 CurrentWeight: 66.3 kg   Referring physician: Dr Roderic Palau   CHIEF COMPLAINT:   Acute on chronic hypoxemic respiratory failure   HISTORY OF PRESENT ILLNESS   60 yo M w/hx of chronic hypoxemia and ILD post COVID19, T2DM, PE on eliquis, Lymphoma s/p Rituxan which is completed.  Came in due to worsening SOB and DOE specifically mild exertional defacation/urination with feelings of presyncope and severe dyspnea which started to get worse post tapering from steroid appx few days prior to onset of symptom progression.  He was placed on solumedrol and reported mild improvement.  He is on 10L /min [place] during my evaluation.  He had CT chest done with PE protocol and noted to have bilateral GGO infiltrates worse compared to June study. PCCM consultation for further evaluation and management.     01/17/21-  patient seems to be slighly clinically worse post reduction of IV steroids yesterday indicative of ongoing ILD exacerbation, thus far infectious workup is negative but inflammatory biomarkers are highly elevated also pointing towards ILD exacerbation.  There is new CXR in process this morning for interval changes. We discussed increasing steroids slightly and giving more time.  I will also initiate steroid sparing immunomodulation with BID cellcept as patient will likely need therapy for prolonged time period.  Compensatory tachycardia noted appreciate cardiology team.  Sugars with peaks and valleys with adjusted steroid therapy.  Patient with muscle wasting and protein calorie malnutrition but eating better this am.  01/18/21- patient is on HFNC at 60%, feels better, has not been able to tolerate BIPAP.  Wife is at bedside.  BP is borderline low MAP 76.  He has negative infectious workup and thus far ILD exacerbation seems to be main differential. He  continues to have peaks and valleys in blood glucose.  We started steroid sparing regimen with cellcept bid 250 mg and will increase dose as we taper off prednisone.  Remains tachyarrtythmic but improved. Appreciate everyone involved.     01/19/21- patient seen and examined at bedside.  Wife present during evaluation.  Ow HFNC weaned to 41%/45L/MIN.  CRP is still up, have increased cellcept and reducing solumedrol.  Patient felt stronger physically and was able to get OOB to chair. Cough is less now.  He is non-labored with breathing no accessory mm use today. He had dark stools with reduced hb, FOBT ordred.  Haptoglobin is elevated. Pathology slide review ordered.   01/20/21- patient is essentially unchanged from yesterday from pulmonary perspective. CXR also unchanged,  bloodwork with reduction in CRP.  Plan to wean O2 and continue OOB with PT/OT,  Continue current steroid dose and Cellcept.   01/21/21- patient is stable on HFNC.  He had small set back with increased O2 req after exerting himself in bathroom.  HFNC from 41/45 to 54/45.   He diuresed very well with lasix 20 bid >2L urine in past 24h.  Tachycardia is improved. He does not feel weaker and infact states he feels slowly improved.   01/22/21- I spoke to patient regarding goals of care and recommend DNR.  He is agreeable but I asked him to discuss with wife and family out of respect and we will revisit tommorow.  He states he is feeling better  and CRP is trending down but he remains on HFNC 60/40.  He was able to get OOB today and walked to bathroom but experienced mild desaturation with quick recovery "not much issues".      01/23/21- patient is still on high flow nasal canula , he is on 56%/45L.  We reviewed code status and he is still in discussion with family regarding DNR.   We discussed lung transplant per family request and this may be a future option if he is strong enough and recovers.  I have increased insluin TID to 20 from 15 and  increased bactrim to bid SS   01/24/21- patient weaned on HFNC to 49/45, he was able to use BIPAP overnight thinks its helping.  CXR this AM with interval improvement. Will keep on current dose of meds.  Repeat CRP in am.  Last BM yesterday well formed no blood, eating good, urine output very good on 20 bid lasix. Getting OOB with  transient desaturation.  Overall slight improvement.    01/25/21- patient has weaned down to 35%/40L/min.  He remains full code. Reviewed medical plan with Dr Jimmye Norman today.  Reduced steroids from 50 BID to 40 BID and also reduced insulin slightly.    01/26/21- patient is further weaned to 15L humidified HF nasal canula.  He did PT ROM exercises with mild desaturation. CXR today with expiratory image unable to see much interval changes. Patient reports improvement clinically. Plan to continue current regimen. Sugars slightly high due to steroids, will wean steroids soon. Continue cellcept as current 500 bid  01/27/21- patient is relatively same as yesterday, his O2 is weaned down to 14L.  He remains on IV solumedrol at 50 bid, we did discuss weaning this further to 40 BID. Will keep cellcept at current dose 500 bid.  His ankles have been down in seated position and are edematous on examination. He continues to participate with PT and feels good subjectively. We discussed starting dose of lasix for pedal edema.    01/28/21- patient examined at bedside, s/p PT/OT session with slight dyspnea.  He was weaned to 10L O2 but then went back up to 13 post PT due to increased WOB.  He has complete resolution of LE edema and appears to be euvolemic at this point. We discussed reducing soluemdrol today to 20 bid.  I reviewed plan with Dr Leslye Peer today.  01/29/21- patient examined at bedside.  He had a good day and feels better. We reviewed medical plan and are reducing steroids to PO prednisone starting at 1m daily.   01/30/21-  Patient seen and examined at bedside.  CXR this am with  worsening pulmonary edema, included below.  Vitals stable with mild tachypnea and tachycardia with activity. Will deliver 1 dose IV lasix 20 today. Plan to continue Pred PO and cellcept at 500 bid. Pulmonary intensive PT with IS and will initiate Metaneb therapy with albuterol. Blood work today will be repeated CRP CMP phos procal ESR.  Patient is agreeable to BIPAP QHS and Metaneb BID.   01/31/21-  patient had set back yesterday with mild flash pulm edema on CXR. He diuresed very well overnight and feels back to where he was at previously.  He had transient reactive leukocytosis as well.  We initiated empirically zosyn and zithromax on top of bid bactrim.  This probably could be dcd since he recovered so quickly which would not occur with pneumonia.  Ill go head and dc this today but continue PO bactrim.  Will stay on pred 50 today with cellcept at 500 bid as previous.  His O2 has been reduced from 95 to 70% overnight and hes working with PT today. Hopefully we can wean this back down to 10L as he was previosly then back to his home setting.   02/01/21- patient is somewhat better today, he is being weaned on O2 and is now down to 40%. He had BM today. Leaving prednisone at current dose and cxr today.   02/02/21- patient is now on 15L HF bubbler.  He reports feeling better.  He had 3 bowel movements after stool softner. He is working with PT/OT.  His long term prognosis is poor unfortunately and we appreciate collaboration with Palliative care team.   02/03/21- patient had over exerted himself again and required increased O2. He states he feels better and just did more then he should have with PT.  His wbc count is trending down. His CRP is trending down.  I have started morhpine for air hunger and pain at sacrum for skin breakdown and pressure ulceration.     02/04/21- patient is unchanged.  He wants to keep fighting to improve.  CRP is trending down again.  ESR is upward trending. He was unable to perform  adequately with PT and had severe dyspnea and desaturation just to get OOB to chair. Discussed case with [employer] to see if he can have HFNC at home but the device has been recalled. Discussed patient with Dr Roosevelt Locks plan is for additional diuresis.  I have increased cellcept to 750 BID today  02/05/21- patient with UOP >2L yesterday, his spO2 req remains HFNC 35/40.  He feels well and wants to keep working to be discharged to rehab. We have ordered NIV for him.  Patient has pulmonary fibrosis with restrictive lung disease and would benefit from device such as Trilogy 100/200 with O2 bleed in.  His overall prognosis is poor.   02/06/21- patient is back up to 50%/45L.  He clinically feels well but just not able to get off the oxygen or improve as well as we had hoped.  I had lengthy discussion with patient he wishes to continue current inpatient therapy and wants to be more aggressive medically.  I have increased steroids to solumedrol 40 bid again.    02/07/21- patient is unchanged, he states he feels well.  Sitting up in chair speaking infull sentences but still rquiring HFNC at 50%/45L.  Today will perform HRCT to evaluate details of pulmonary parenchyma.   02/08/21- patient s/p HRCT with bilateral pulmonary fibrosis and bronchiectasis.  We discussed [employer] VEST therapy.  Reviewed with oncology will plan to remove chest port to allow patient to use vest therapy.  He is working with PT to wean O2.   PAST MEDICAL HISTORY   Past Medical History:  Diagnosis Date  . Chronic respiratory failure with hypoxia (HCC)    2L [place] continuous  . COVID-19   . Diabetes mellitus without complication ([place])   . Diverticulitis   . Follicular lymphoma of intra-abdominal lymph nodes ([place]) 02/15/2019  . ILD (interstitial lung disease) ([place])      SURGICAL HISTORY   Past Surgical History:  Procedure Laterality Date  . CHOLECYSTECTOMY    . COLON RESECTION     DUE TO DIVERTICULITIS  . FLEXIBLE BRONCHOSCOPY N/A 12/02/2019    Procedure: FLEXIBLE BRONCHOSCOPY;  Surgeon: Ottie Glazier, MD;  Location: [facility];  Service: Thoracic;  Laterality: N/A;  . PORTA CATH INSERTION N/A 03/11/2019  Procedure: PORTA CATH INSERTION;  Surgeon: Algernon Huxley, MD;  Location: [facility];  Service: Cardiovascular;  Laterality: N/A;  . PULMONARY THROMBECTOMY N/A 01/29/2019   Procedure: PULMONARY THROMBECTOMY;  Surgeon: Algernon Huxley, MD;  Location: [facility];  Service: Cardiovascular;  Laterality: N/A;     FAMILY HISTORY   Family History  Problem Relation Age of Onset  . Diabetes Brother      SOCIAL HISTORY   Social History   Tobacco Use  . Smoking status: Never  . Smokeless tobacco: Never  Vaping Use  . Vaping Use: Never used  Substance Use Topics  . Alcohol use: Yes    Alcohol/week: 0.0 - 2.0 standard drinks    Comment: occasional  . Drug use: Never     MEDICATIONS    Home Medication:    Current Medication:  Current Facility-Administered Medications:  .  acetaminophen (TYLENOL) tablet 650 mg, 650 mg, Oral, Q6H PRN, 650 mg at 02/05/21 0749 **OR** acetaminophen (TYLENOL) suppository 650 mg, 650 mg, Rectal, Q6H PRN, Howerter, Justin B, DO .  ALPRAZolam (XANAX) tablet 0.25 mg, 0.25 mg, Oral, QHS PRN, Sharen Hones, MD .  ALPRAZolam Duanne Moron) tablet 0.25 mg, 0.25 mg, Oral, q morning, Sharen Hones, MD, 0.25 mg at 02/07/21 0947 .  apixaban (ELIQUIS) tablet 5 mg, 5 mg, Oral, BID, Beers, Shanon Brow, RPH, 5 mg at 02/07/21 0948 .  Chlorhexidine Gluconate Cloth 2 % PADS 6 each, 6 each, Topical, Daily, Kathie Dike, MD, 6 each at 02/07/21 0948 .  digoxin (LANOXIN) tablet 0.125 mg, 0.125 mg, Oral, Daily, Memon, Jolaine Artist, MD, 0.125 mg at 02/07/21 0953 .  diltiazem (CARDIZEM CD) 24 hr capsule 120 mg, 120 mg, Oral, Daily, Yocheved Depner, MD, 120 mg at 02/07/21 0947 .  diltiazem (CARDIZEM) injection 10 mg, 10 mg, Intravenous, Q6H PRN, Wyvonnia Dusky, MD, 10 mg at 01/24/21 2328 .  docusate sodium  (COLACE) capsule 200 mg, 200 mg, Oral, BID, Wyvonnia Dusky, MD, 200 mg at 02/07/21 0948 .  feeding supplement (NEPRO CARB STEADY) liquid 237 mL, 237 mL, Oral, TID BM, Memon, Jehanzeb, MD, Last Rate: 0 mL/hr at 01/21/21 2130, 237 mL at 02/07/21 0949 .  furosemide (LASIX) injection 20 mg, 20 mg, Intravenous, Daily, Ottie Glazier, MD, 20 mg at 02/07/21 0949 .  guaiFENesin (MUCINEX) 12 hr tablet 1,200 mg, 1,200 mg, Oral, BID, Kathie Dike, MD, 1,200 mg at 02/07/21 0947 .  guaiFENesin-dextromethorphan (ROBITUSSIN DM) 100-10 MG/5ML syrup 5 mL, 5 mL, Oral, Q4H PRN, Kathie Dike, MD, 5 mL at 02/04/21 0850 .  insulin aspart (novoLOG) injection 0-20 Units, 0-20 Units, Subcutaneous, TID WC, Kathie Dike, MD, 11 Units at 02/07/21 0949 .  insulin aspart (novoLOG) injection 0-5 Units, 0-5 Units, Subcutaneous, QHS, Kathie Dike, MD, 2 Units at 02/06/21 2132 .  insulin aspart (novoLOG) injection 10 Units, 10 Units, Subcutaneous, TID WC, Loletha Grayer, MD, 10 Units at 02/07/21 0949 .  insulin glargine-yfgn (SEMGLEE) injection 9 Units, 9 Units, Subcutaneous, BID, Roosevelt Locks, Dekui, MD .  lactulose (CHRONULAC) 10 GM/15ML solution 30 g, 30 g, Oral, BID PRN, Leslye Peer, Richard, MD .  levalbuterol Penne Lash) nebulizer solution 1.25 mg, 1.25 mg, Nebulization, Q4H PRN, Howerter, Justin B, DO, 1.25 mg at 01/17/21 2011 .  methylPREDNISolone sodium succinate (SOLU-MEDROL) 40 mg/mL injection 40 mg, 40 mg, Intravenous, Q12H, Lanney Gins, Armonee Bojanowski, MD, 40 mg at 02/07/21 0245 .  morphine 2 MG/ML injection 1 mg, 1 mg, Intravenous, Q3H PRN, Ottie Glazier, MD, 1 mg at 02/06/21 1412 .  multivitamin with minerals tablet 1 tablet, 1 tablet, Oral, Daily, Kathie Dike, MD, 1 tablet at 02/06/21 1416 .  mycophenolate (CELLCEPT) capsule 750 mg, 750 mg, Oral, BID, Lanney Gins, Monti Jilek, MD, 750 mg at 02/07/21 0953 .  polyethylene glycol (MIRALAX / GLYCOLAX) packet 17 g, 17 g, Oral, Daily, Leslye Peer, Richard, MD, 17 g at 02/07/21 0950 .   simethicone (MYLICON) chewable tablet 80 mg, 80 mg, Oral, QID PRN, Loletha Grayer, MD, 80 mg at 01/29/21 1411 .  sodium chloride (OCEAN) 0.65 % nasal spray 1 spray, 1 spray, Each Nare, PRN, Loletha Grayer, MD, 1 spray at 01/30/21 2142 .  sodium chloride flush (NS) 0.9 % injection 10-40 mL, 10-40 mL, Intracatheter, Q12H, Memon, Jolaine Artist, MD, 10 mL at 02/07/21 0950 .  sodium chloride flush (NS) 0.9 % injection 10-40 mL, 10-40 mL, Intracatheter, PRN, Kathie Dike, MD, 10 mL at 01/24/21 0915 .  sulfamethoxazole-trimethoprim (BACTRIM) 400-80 MG per tablet 1 tablet, 1 tablet, Oral, Q12H, Ottie Glazier, MD, 1 tablet at 02/07/21 0953 .  traZODone (DESYREL) tablet 50 mg, 50 mg, Oral, QHS PRN, Sharion Settler, NP, 50 mg at 02/03/21 2137    ALLERGIES   Penicillins     REVIEW OF SYSTEMS    Review of Systems:  Gen:  Denies  fever, sweats, chills weigh loss  HEENT: Denies blurred vision, double vision, ear pain, eye pain, hearing loss, nose bleeds, sore throat Cardiac:  No dizziness, chest pain or heaviness, chest tightness,edema Resp:  admits to severe dyspnea Gi: Denies swallowing difficulty, stomach pain, nausea or vomiting, diarrhea, constipation, bowel incontinence Gu:  Denies bladder incontinence, burning urine Ext:   Denies Joint pain, stiffness or swelling Skin: Denies  skin rash, easy bruising or bleeding or hives Endoc:  Denies polyuria, polydipsia , polyphagia or weight change Psych:   admits to axniety  Other:  All other systems negative   VS: BP 133/79 (BP Location: Right Arm)   Pulse 77   Temp 98.3 F (36.8 C)   Resp 18   Ht 6' (1.829 m)   Wt 66.3 kg   SpO2 95%   BMI 19.82 kg/m      PHYSICAL EXAM    GENERAL:NAD, no fevers, chills, no weakness no fatigue HEAD: Normocephalic, atraumatic.  EYES: Pupils equal, round, reactive to light. Extraocular muscles intact. No scleral icterus.  MOUTH: Moist mucosal membrane. Dentition intact. No abscess noted.  EAR,  NOSE, THROAT: Clear without exudates. No external lesions.  NECK: Supple. No thyromegaly. No nodules. No JVD.  PULMONARY: Bilateral rhonchi improved from previous CARDIOVASCULAR: S1 and S2. Regular rate and rhythm. No murmurs, rubs, or gallops. No edema. Pedal pulses 2+ bilaterally.  GASTROINTESTINAL: Soft, nontender, nondistended. No masses. Positive bowel sounds. No hepatosplenomegaly.  MUSCULOSKELETAL: No swelling, clubbing, or edema. Range of motion full in all extremities.  NEUROLOGIC: Cranial nerves II through XII are intact. No gross focal neurological deficits. Sensation intact. Reflexes intact.  SKIN: No ulceration, lesions, rashes, or cyanosis. Skin warm and dry. Turgor intact.  PSYCHIATRIC: Mood, affect within normal limits. The patient is awake, alert and oriented x 3. Insight, judgment intact.       IMAGING    CT Angio Chest PE W/Cm &/Or Wo Cm  Result Date: 01/13/2021 CLINICAL DATA:  Respiratory distress, hypoxia EXAM: CT ANGIOGRAPHY CHEST WITH CONTRAST TECHNIQUE: Multidetector CT imaging of the chest was performed using the standard protocol during bolus administration of intravenous contrast. Multiplanar CT image reconstructions and MIPs were obtained to evaluate the vascular anatomy. CONTRAST:  26m  OMNIPAQUE IOHEXOL 350 MG/ML SOLN COMPARISON:  10/26/2020, 01/13/2021 FINDINGS: Cardiovascular: This is a technically adequate evaluation of the pulmonary vasculature. No filling defects or pulmonary emboli. The heart is unremarkable without pericardial effusion. No evidence of thoracic aortic aneurysm or dissection. Right chest wall port via internal jugular approach tip within the superior vena cava. Mediastinum/Nodes: No pathologic adenopathy within the mediastinum, hila, or axilla. Thyroid, trachea, and esophagus are unremarkable. Lungs/Pleura: There has been interval progression of the multifocal ground-glass airspace disease seen previously. Changes are most pronounced within the  dependent lower lobes. No effusion or pneumothorax. The central airways are patent, with persistent bronchiectasis. Upper Abdomen: No acute abnormality. Musculoskeletal: No acute or destructive bony lesions. Reconstructed images demonstrate no additional findings. Review of the MIP images confirms the above findings. IMPRESSION: 1. No evidence of pulmonary embolus. 2. Progressive multifocal bilateral ground-glass airspace disease, greatest at the lung bases. The differential diagnosis would include progressive postinflammatory scarring and fibrosis, multifocal atypical pneumonia, or hypersensitivity/drug toxicity. 3. Stable bronchiectasis. Electronically Signed   By: Randa Ngo M.D.   On: 01/13/2021 17:13   DG Chest Port 1 View  Result Date: 02/05/2021 CLINICAL DATA:  Bilateral pulmonary infiltrates on chest x-ray. EXAM: PORTABLE CHEST 1 VIEW COMPARISON:  Chest radiograph 02/01/2021 FINDINGS: Power injectable right IJ port central venous catheter tip projects at the level of the superior cavoatrial junction. Unchanged low lung volumes with lower lobe predominant interstitial opacities, left greater than right. No new focal consolidation, pleural effusion, or pneumothorax. Heart is normal in size. Surgical clips in the right upper abdomen. IMPRESSION: Bilateral interstitial airspace opacities are unchanged compared to 02/01/2021. No new focal consolidation, pleural effusion, or pneumothorax. Electronically Signed   By: Ileana Roup M.D.   On: 02/05/2021 10:59   DG Chest Port 1 View  Result Date: 02/01/2021 CLINICAL DATA:  Shortness of breath, edema EXAM: PORTABLE CHEST 1 VIEW COMPARISON:  01/30/2021 FINDINGS: Stable positioning of right IJ approach Port-A-Cath. Heart size within normal limits. Low lung volumes. Slight interval worsening of bilateral airspace opacities, left worse than right. No pleural effusion or pneumothorax. IMPRESSION: Slight interval worsening of bilateral airspace opacities, left  worse than right. Electronically Signed   By: Davina Poke D.O.   On: 02/01/2021 14:37   DG Chest Port 1 View  Result Date: 01/30/2021 CLINICAL DATA:  Acute on chronic hypoxemic respiratory failure. EXAM: PORTABLE CHEST 1 VIEW COMPARISON:  January 28, 2021 FINDINGS: No pneumothorax. Stable cardiomediastinal silhouette and right Port-A-Cath. Left-greater-than-right pulmonary opacities/infiltrates. No other interval changes. IMPRESSION: 1. Persistent low lung volumes and bilateral pulmonary infiltrates. No interval changes. Electronically Signed   By: Dorise Bullion III M.D.   On: 01/30/2021 08:36   DG Chest Port 1 View  Result Date: 01/28/2021 CLINICAL DATA:  Shortness of breath. EXAM: PORTABLE CHEST 1 VIEW COMPARISON:  Multiple recent chest films. FINDINGS: The right IJ power port is stable. Persistent low lung volumes and bilateral pulmonary infiltrates. No definite pleural effusion or pneumothorax. IMPRESSION: Persistent low lung volumes and bilateral pulmonary infiltrates. Electronically Signed   By: Marijo Sanes M.D.   On: 01/28/2021 16:03   DG Chest Port 1 View  Result Date: 01/26/2021 CLINICAL DATA:  Shortness of breath, abnormal chest x-ray EXAM: PORTABLE CHEST 1 VIEW COMPARISON:  01/24/2021 FINDINGS: Right Port-A-Cath remains in place, unchanged. Low lung volumes with bilateral airspace opacities, most pronounced in the left perihilar and lower lobe, unchanged since prior study. No visible effusions or pneumothorax. Heart is normal size. IMPRESSION: Low  lung volumes with bilateral airspace disease, left-greater-than-right. No significant change. Electronically Signed   By: Rolm Baptise M.D.   On: 01/26/2021 09:42   DG Chest Port 1 View  Result Date: 01/24/2021 CLINICAL DATA:  Follow-up multifocal pneumonia. EXAM: PORTABLE CHEST 1 VIEW COMPARISON:  01/22/2021 and older exams. FINDINGS: Mild interval decrease in the airspace lung opacities, most evident on the left. Lung volumes remain  low. No new lung abnormalities. No visualized pleural effusion.  No pneumothorax. Right internal jugular Port-A-Cath is stable. IMPRESSION: 1. Mild interval improvement in lung aeration with decreased airspace lung opacities, most notably on the left. Electronically Signed   By: Lajean Manes M.D.   On: 01/24/2021 07:57   DG Chest Port 1 View  Result Date: 01/22/2021 CLINICAL DATA:  Hypoxia, shortness of breath. EXAM: PORTABLE CHEST 1 VIEW COMPARISON:  January 20, 2021. FINDINGS: Stable cardiomediastinal silhouette. Right internal jugular Port-A-Cath is unchanged in position. Hypoinflation of the lungs is noted. Stable bilateral lung opacities are noted, left greater than right, most consistent with multifocal pneumonia. Bony thorax is unremarkable. IMPRESSION: Hypoinflation of the lungs. Stable bilateral lung opacities are noted, left greater than right, most consistent with multifocal pneumonia. Electronically Signed   By: Marijo Conception M.D.   On: 01/22/2021 19:26   DG Chest Port 1 View  Result Date: 01/20/2021 CLINICAL DATA:  Pneumonitis EXAM: PORTABLE CHEST 1 VIEW COMPARISON:  01/19/2021 FINDINGS: Shallow inspiration. Patchy infiltrates in the left lung and right lung base, similar to prior study and likely multifocal pneumonia. No pleural effusions. No pneumothorax. Mediastinal contours appear intact. Heart size is normal. Power port type central venous catheter on the right with tip over the cavoatrial junction region. Surgical clips in the right upper quadrant. IMPRESSION: Shallow inspiration. Patchy infiltrates in both lungs, likely multifocal pneumonia, without change. Electronically Signed   By: Lucienne Capers M.D.   On: 01/20/2021 03:56   DG Chest Port 1 View  Result Date: 01/19/2021 CLINICAL DATA:  Abnormality on previous chest x-ray in a 60 year old male. EXAM: PORTABLE CHEST 1 VIEW COMPARISON:  CT from January 13, 2021, chest x-ray January 18, 2021. FINDINGS: RIGHT-sided  Port-A-Cath terminates at the caval to atrial junction. Trachea is midline. Cardiomediastinal contours are stable. Interstitial and airspace opacities throughout the chest worse in the LEFT mid to upper chest, RIGHT upper chest lung bases bilaterally with similar appearance. On limited assessment there is no acute skeletal process. EKG leads project over the chest. IMPRESSION: No interval change in the appearance of the chest since previous imaging with continued decreased lung volumes and patchy bilateral opacities. Electronically Signed   By: Zetta Bills M.D.   On: 01/19/2021 07:59   DG Chest Port 1 View  Result Date: 01/18/2021 CLINICAL DATA:  Respiratory distress EXAM: PORTABLE CHEST 1 VIEW COMPARISON:  01/17/2021 FINDINGS: Cardiac shadow is stable. Right-sided chest wall port is again seen. Lungs are hypoinflated. Patchy airspace opacities again identified in both lungs left greater than right but stable from the prior exam. IMPRESSION: Patchy airspace opacities bilaterally stable from the prior exam. Electronically Signed   By: Inez Catalina M.D.   On: 01/18/2021 17:24   DG Chest Port 1 View  Result Date: 01/17/2021 CLINICAL DATA:  Worsening shortness of breath. EXAM: PORTABLE CHEST 1 VIEW COMPARISON:  01/14/2021 FINDINGS: 0826 hours. Low volume film. Patchy airspace disease again noted in both lungs, left greater than right. Cardiopericardial silhouette is at upper limits of normal for size. Right Port-A-Cath again noted. Telemetry  leads overlie the chest. IMPRESSION: Low volume film with patchy bilateral airspace disease, left greater than right. No substantial change. Electronically Signed   By: Misty Stanley M.D.   On: 01/17/2021 08:53   DG Chest Port 1 View  Result Date: 01/14/2021 CLINICAL DATA:  Shortness of breath.  History of cancer.  Diabetes. EXAM: PORTABLE CHEST 1 VIEW.  Patient is rotated. COMPARISON:  Chest x-ray 01/13/2021, CT chest 01/13/2021 FINDINGS: Accessed right chest wall  Port-A-Cath in stable position. The heart and mediastinal contours are within normal limits. Low lung volumes with persistent diffuse patchy airspace opacities. No pleural effusion. No pneumothorax. No acute osseous abnormality. Right upper quadrant surgical clips. IMPRESSION: Low lung volumes with persistent diffuse patchy airspace opacities. Electronically Signed   By: Iven Finn M.D.   On: 01/14/2021 21:08   DG Chest Port 1 View  Result Date: 01/13/2021 CLINICAL DATA:  Shortness of breath EXAM: PORTABLE CHEST 1 VIEW COMPARISON:  Chest radiograph 07/29/2019 FINDINGS: There is a right chest wall port with the tip terminating in the lower SVC/cavoatrial junction. The cardiomediastinal silhouette is within normal limits. Lung volumes are low with unchanged asymmetric elevation of the right hemidiaphragm. There are increased interstitial markings with diffuse reticular opacities throughout both lungs. There is no focal consolidation. There is no significant pleural effusion. There is no pneumothorax. There is no acute osseous abnormality. There is gaseous distention of the stomach and large bowel in the right upper quadrant. IMPRESSION: Low lung volumes with suspected mild pulmonary interstitial edema. Atypical/viral infection could have a similar appearance. Electronically Signed   By: Valetta Mole M.D.   On: 01/13/2021 14:24           ASSESSMENT/PLAN   Acute hypoxemic respiratory failure Due to acute exacerbation of interstitial lung disease with pulmonary fibrosis - present on admission  - COVID19 negative   - supplemental O2 during my evaluation 10l/min>>8L>>HFNC  -Respiratory viral panel-negative  -serum fungitell-negative  -legionella ab-negative  -nasal MRSA PCR-negative -Procalcitonin trend reviewed -strep pneumoniae ur AG -Histoplasma Ur Ag-negative -sputum resp cultures -AFB sputum expectorated specimen -sputum cytology  -completed  Cefepime course for cap -Reduced  Solumedrol to 40 BID - 01/15/21>>50 BID>>40 BID>>30 BID>>20 BID>>pred 50>>soluemdrol 40 iv bid please watch surgars -reviewed pertinent imaging with patient today - ESR and CRP are elevated but improving -PT/OT for d/c planning  -please encourage patient to use incentive spirometer few times each hour while hospitalized.   -continue Cellcept 250 bid >>500 bid >>750BID -UOP is adequate -Discussed goal of care - recommend DNR patient is in agreement but still discussing with wife, palliative care service to follow  -lasix 20 daily PO     Thank you for allowing me to participate in the care of this patient.   Patient/Family are satisfied with care plan and all questions have been answered.   This document was prepared using Dragon voice recognition software and may include unintentional dictation errors.     Ottie Glazier, M.D.  Division of [facility]

## 2021-02-07 NOTE — Progress Notes (Signed)
Physical Therapy Treatment Patient Details Name: Edward Atkinson MRN: [id number] DOB: [birth year] Today's Date: 02/07/2021   History of Present Illness Pt is a 60 y.o. male with medical history significant for chronic hypoxic respiratory failure on 2 L continuous nasal cannula in the setting of interstitial lung disease, type 2 diabetes mellitus, pulmonary embolism in 2020 now chronically anticoagulated on Eliquis, who arrived to ED with shortness of breath. MD assessment includes:  acute on chronic hypoxic respiratory failure with hypoxia, possible sepsis due to bilateral pneumonia, rapid atrial fibrillation, anemia, and mild elevation of troponin with suspected demand ischemia.    PT Comments    Pt seen for PT tx with pt agreeable to intervention & OOB mobility. Pt is mod I for bed mobility & transfers to recliner without AD but BUE on armrests with close supervision<>CGA. Pt on 40 L/min HHFNC with SpO2 dropping to 82% after transfer but able to recover to 90% when PT departed. Pt with increased RR after transfer & endorses fatigue, deferring additional exercises at this time. Pt would benefit from ongoing acute PT services to progress gait with LRAD & increase endurance/activity tolerance.    Recommendations for follow up therapy are one component of a multi-disciplinary discharge planning process, led by the attending physician.  Recommendations may be updated based on patient status, additional functional criteria and insurance authorization.  Follow Up Recommendations  Home health PT;Supervision for mobility/OOB     Equipment Recommendations  Rolling walker with 5" wheels;3in1 (PT) (transport w/c)    Recommendations for Other Services       Precautions / Restrictions Precautions Precautions: Fall Precaution Comments: O2!!! Restrictions Weight Bearing Restrictions: No     Mobility  Bed Mobility Overal bed mobility: Modified Independent             General bed mobility  comments: supine>sit with use of hospital bed features    Transfers Overall transfer level: Needs assistance Equipment used: None Transfers: Sit to/from Stand;Stand Pivot Transfers Sit to Stand: Supervision;Min guard Stand pivot transfers: Supervision;Min guard       General transfer comment: Pt able to stand & take a step or 2 to recliner with close supervision<>CGA without AD  Ambulation/Gait                 Stairs             Wheelchair Mobility    Modified Rankin (Stroke Patients Only)       Balance Overall balance assessment: Needs assistance Sitting-balance support: Feet supported;No upper extremity supported Sitting balance-Leahy Scale: Normal     Standing balance support: Bilateral upper extremity supported;No upper extremity supported;During functional activity Standing balance-Leahy Scale: Good                              Cognition Arousal/Alertness: Awake/alert Behavior During Therapy: WFL for tasks assessed/performed Overall Cognitive Status: Within Functional Limits for tasks assessed                                 General Comments: Very pleasant & agreeable to tx.      Exercises      General Comments        Pertinent Vitals/Pain Pain Assessment: No/denies pain    Home Living  Prior Function            PT Goals (current goals can now be found in the care plan section) Acute Rehab PT Goals Patient Stated Goal: walk again without being so SOB PT Goal Formulation: With patient Time For Goal Achievement: 02/16/21 Potential to Achieve Goals: Fair Progress towards PT goals: Progressing toward goals    Frequency    Min 2X/week      PT Plan Current plan remains appropriate    Co-evaluation              AM-PAC PT "6 Clicks" Mobility   Outcome Measure  Help needed turning from your back to your side while in a flat bed without using bedrails?: None Help  needed moving from lying on your back to sitting on the side of a flat bed without using bedrails?: None Help needed moving to and from a bed to a chair (including a wheelchair)?: A Little Help needed standing up from a chair using your arms (e.g., wheelchair or bedside chair)?: A Little Help needed to walk in hospital room?: A Little Help needed climbing 3-5 steps with a railing? : A Little 6 Click Score: 20    End of Session Equipment Utilized During Treatment: Oxygen Activity Tolerance: Patient tolerated treatment well;Patient limited by fatigue Patient left: in chair;with call bell/phone within reach Nurse Communication:  (urine output) PT Visit Diagnosis: Muscle weakness (generalized) (M62.81);Difficulty in walking, not elsewhere classified (R26.2)     Time: 5498-2641 PT Time Calculation (min) (ACUTE ONLY): 8 min  Charges:  $Therapeutic Activity: 8-22 mins                     [name], PT, DPT 02/07/21, 10:57 AM    [name] 02/07/2021, 10:56 AM

## 2021-02-07 NOTE — Progress Notes (Signed)
PROGRESS NOTE    Edward Atkinson  ZOX:[id number] DOB: Feb 13, 1961 DOA: 01/13/2021 PCP: Rusty Aus, MD   Chief complaint.  Shortness of breath. Brief Narrative:  60 year old man with history of follicular lymphoma, [id number] infection, interstitial lung disease and chronic respiratory failure, and type 2 diabetes mellitus.  Patient was admitted 20 days ago with acute hypoxic respiratory failure and interstitial lung disease exacerbation.  Patient had a CT scan of the chest that did not show any pulmonary embolism.  The patient was on heated high flow nasal cannula during the course and then was tapered over to bubble high flow nasal cannula.  Patient condition appears to be fluctuating, currently, he is back on 45% of heated high flow again today.  Has completed a course of antibiotics.  He is still on steroids.  He has been followed by pulmonology   Assessment & Plan:   Principal Problem:   Acute on chronic respiratory failure with hypoxia ([place]) Active Problems:   Type 2 diabetes mellitus with hypoglycemia without coma, without long-term current use of insulin (HCC)   Follicular lymphoma (HCC)   SOB (shortness of breath)   Interstitial lung disease (HCC)   Severe sepsis (HCC)   CAP (community acquired pneumonia)   Elevated troponin   Protein calorie malnutrition (HCC)   Protein-calorie malnutrition, severe   AF (paroxysmal atrial fibrillation) (HCC)   Thrombocytosis   Leukocytosis   Pressure injury of skin   Hyponatremia  Acute on chronic hypoxemic respiratory failure. Interstitial lung disease exacerbation Patient was on 50% oxygen last night, not 99% as documented. Patient reports increased mucus production.  Will obtain RT eval and treat, started DuoNeb. Appreciate pulmonology consult, patient currently restarted on IV steroids since yesterday.  Uncontrolled type 2 diabetes with hyperglycemia. Patient has worsening hyperglycemia since yesterday, increase Levemir to twice a  day.   Severe protein calorie malnutrition. Continue protein supplement.  Paroxysmal atrial fibrillation. Continue Eliquis.  Follicular lymphoma.     DVT prophylaxis: Eliquis Code Status: full Family Communication:  Disposition Plan:      Status is: Inpatient   Remains inpatient appropriate because:Inpatient level of care appropriate due to severity of illness, patient has severe acute respite failure on  high flow oxygen.   Dispo: The patient is from: Home              Anticipated d/c is to: Home              Patient currently is not medically stable to d/c.              Difficult to place patient No   Consultants:  Pulmonology   Procedures: None   Antimicrobials: None     I/O last 3 completed shifts: In: 1320 [P.O.:1320] Out: 3250 [Urine:3250] Total I/O In: 480 [P.O.:480] Out: -       Subjective: Patient was on 50% oxygen, not 99% oxygen as documented.  Patient wife was here entire night, there is no adjustment Of oxygen setting. Patient still short of breath with exertion, cough, has increased mucus production for the last 2 days.  Mucus still white. No fever or chills. No dysuria hematuria.   Objective: Vitals:   02/07/21 0400 02/07/21 0430 02/07/21 0755 02/07/21 1028  BP: 127/78  133/79   Pulse: 73  77   Resp: (!) 24  18   Temp: 97.8 F (36.6 C)  98.3 F (36.8 C)   TempSrc: Oral     SpO2: 99%  100% 95%  Weight:  66.3 kg    Height:        Intake/Output Summary (Last 24 hours) at 02/07/2021 1035 Last data filed at 02/07/2021 0940 Gross per 24 hour  Intake 1320 ml  Output 2300 ml  Net -980 ml   Filed Weights   02/04/21 0500 02/06/21 0500 02/07/21 0430  Weight: 68.6 kg 67 kg 66.3 kg    Examination:  General exam: Appears calm and comfortable, cachectic. Respiratory system: Crackles in the base. Respiratory effort normal. Cardiovascular system: S1 & S2 heard, RRR. No JVD, murmurs, rubs, gallops or clicks. No pedal  edema. Gastrointestinal system: Abdomen is nondistended, soft and nontender. No organomegaly or masses felt. Normal bowel sounds heard. Central nervous system: Alert and oriented. No focal neurological deficits. Extremities: Severe muscle atrophy. Skin: No rashes, lesions or ulcers Psychiatry: Judgement and insight appear normal. Mood & affect appropriate.     Data Reviewed: I have personally reviewed following labs and imaging studies  CBC: Recent Labs  Lab 02/01/21 0605 02/03/21 0530  WBC 15.2* 11.4*  NEUTROABS 13.3*  --   HGB 10.2* 10.6*  HCT 30.2* 30.8*  MCV 83.7 83.0  PLT 179 629   Basic Metabolic Panel: Recent Labs  Lab 02/01/21 0605 02/04/21 0500  NA 134* 134*  K 3.8 3.9  CL 95* 95*  CO2 36* 34*  GLUCOSE 127* 146*  BUN 18 17  CREATININE 0.44* 0.50*  CALCIUM 8.0* 7.8*  MG  --  1.9   GFR: Estimated Creatinine Clearance: 89.8 mL/min (A) (by C-G formula based on SCr of 0.5 mg/dL (L)). Liver Function Tests: Recent Labs  Lab 02/01/21 0605  AST 19  ALT 28  ALKPHOS 56  BILITOT 0.7  PROT 4.5*  ALBUMIN 2.3*   No results for input(s): LIPASE, AMYLASE in the last 168 hours. No results for input(s): AMMONIA in the last 168 hours. Coagulation Profile: No results for input(s): INR, PROTIME in the last 168 hours. Cardiac Enzymes: No results for input(s): CKTOTAL, CKMB, CKMBINDEX, TROPONINI in the last 168 hours. BNP (last 3 results) No results for input(s): PROBNP in the last 8760 hours. HbA1C: No results for input(s): HGBA1C in the last 72 hours. CBG: Recent Labs  Lab 02/06/21 1132 02/06/21 1633 02/06/21 1820 02/06/21 2118 02/07/21 0754  GLUCAP 170* 502* 317* 231* 256*   Lipid Profile: No results for input(s): CHOL, HDL, LDLCALC, TRIG, CHOLHDL, LDLDIRECT in the last 72 hours. Thyroid Function Tests: No results for input(s): TSH, T4TOTAL, FREET4, T3FREE, THYROIDAB in the last 72 hours. Anemia Panel: No results for input(s): VITAMINB12, FOLATE,  FERRITIN, TIBC, IRON, RETICCTPCT in the last 72 hours. Sepsis Labs: Recent Labs  Lab 02/01/21 0605  PROCALCITON <0.10    No results found for this or any previous visit (from the past 240 hour(s)).       Radiology Studies: No results found.      Scheduled Meds:  ALPRAZolam  0.25 mg Oral q morning   apixaban  5 mg Oral BID   Chlorhexidine Gluconate Cloth  6 each Topical Daily   digoxin  0.125 mg Oral Daily   diltiazem  120 mg Oral Daily   docusate sodium  200 mg Oral BID   feeding supplement (NEPRO CARB STEADY)  237 mL Oral TID BM   furosemide  20 mg Intravenous Daily   guaiFENesin  1,200 mg Oral BID   insulin aspart  0-20 Units Subcutaneous TID WC   insulin aspart  0-5 Units Subcutaneous QHS   insulin aspart  10 Units Subcutaneous TID WC   insulin glargine-yfgn  9 Units Subcutaneous BID   methylPREDNISolone (SOLU-MEDROL) injection  40 mg Intravenous Q12H   multivitamin with minerals  1 tablet Oral Daily   mycophenolate  750 mg Oral BID   polyethylene glycol  17 g Oral Daily   sodium chloride flush  10-40 mL Intracatheter Q12H   sulfamethoxazole-trimethoprim  1 tablet Oral Q12H   Continuous Infusions:   LOS: 25 days    Time spent: 28 minutes    Sharen Hones, MD [facility]   To contact the attending provider between 7A-7P or the covering provider during after hours 7P-7A, please log into the web site [email or web address] and access using universal [place] password for that web site. If you do not have the password, please call the hospital operator.  02/07/2021, 10:35 AM

## 2021-02-08 ENCOUNTER — Other Ambulatory Visit (INDEPENDENT_AMBULATORY_CARE_PROVIDER_SITE_OTHER): Payer: Self-pay | Admitting: Vascular Surgery

## 2021-02-08 DIAGNOSIS — J9621 Acute and chronic respiratory failure with hypoxia: Secondary | ICD-10-CM | POA: Diagnosis not present

## 2021-02-08 DIAGNOSIS — J849 Interstitial pulmonary disease, unspecified: Secondary | ICD-10-CM | POA: Diagnosis not present

## 2021-02-08 LAB — GLUCOSE, CAPILLARY
Glucose-Capillary: 293 mg/dL — ABNORMAL HIGH (ref 70–99)
Glucose-Capillary: 325 mg/dL — ABNORMAL HIGH (ref 70–99)
Glucose-Capillary: 72 mg/dL (ref 70–99)
Glucose-Capillary: 352 mg/dL — ABNORMAL HIGH (ref 70–99)

## 2021-02-08 LAB — C-REACTIVE PROTEIN: CRP: 1.3 mg/dL — ABNORMAL HIGH (ref <1.0)

## 2021-02-08 LAB — SEDIMENTATION RATE: Sed Rate: 60 mm/hr — ABNORMAL HIGH (ref 0–20)

## 2021-02-08 MED ORDER — ACETAMINOPHEN 650 MG RE SUPP
650.0000 mg | Freq: Four times a day (QID) | RECTAL | Status: DC | PRN
  Filled 2021-02-08: qty 1

## 2021-02-08 MED ORDER — ACETAMINOPHEN 325 MG PO TABS
650.0000 mg | ORAL_TABLET | Freq: Four times a day (QID) | ORAL | Status: DC | PRN
  Administered 2021-02-14 – 2021-02-18 (×3): 650 mg via ORAL
  Filled 2021-02-08 (×3): qty 2

## 2021-02-08 MED ORDER — SODIUM CHLORIDE 0.9 % IV SOLN: INTRAVENOUS | Status: AC

## 2021-02-08 NOTE — Progress Notes (Signed)
Nutrition Follow Up Note   DOCUMENTATION CODES:   Severe malnutrition in context of chronic illness  INTERVENTION:   Nepro Shake po TID, each supplement provides 425 kcal and 19 grams protein  MVI po daily   NUTRITION DIAGNOSIS:   Severe Malnutrition related to chronic illness (lymphoma, ILD) as evidenced by severe fat depletion, severe muscle depletion, 16 percent weight loss in <3 months.  GOAL:   Patient will meet greater than or equal to 90% of their needs -progressing   MONITOR:   PO intake, Supplement acceptance, Labs, Weight trends, Skin, I & O's  ASSESSMENT:   60 y/o male with h/o ILD, COVID19, T2DM, PE on eliquis and lymphoma s/p Rituxan which is completed who is admitted with PNA and sepsis.  Pt's oral intake is improved since resolution of constipation. Pt eating 100% of meals in hospital and is drinking Nepro supplements. Pt will be NPO at midnight for port removal tomorrow. Per chart, pt appears fairly weight stable since admission. Pt currently down ~6lbs(4%) from his last documented office visit on 9/9.   Medications reviewed and include: colace, lasix, insulin, solu-medrol, MVI, cellcept, miralax, NaCl @75ml /hr, bactrim  Labs reviewed: cbgs- 293, 325 x 24 hrs  Diet Order:    Diet Order             Diet NPO time specified  Diet effective midnight           Diet Carb Modified Fluid consistency: Thin; Room service appropriate? Yes  Diet effective now                  EDUCATION NEEDS:   Education needs have been addressed  Skin:  Skin Assessment: Reviewed RN Assessment  Last BM:  10/8- type 5  Height:   Ht Readings from Last 1 Encounters:  01/14/21 6' (1.829 m)    Weight:   Wt Readings from Last 1 Encounters:  02/07/21 66.3 kg    Ideal Body Weight:  80.9 kg  BMI:  Body mass index is 19.82 kg/m.  Estimated Nutritional Needs:   Kcal:  2200-2500kcal/day  Protein:  110-125g/day  Fluid:  2.0-2.3L/day  Koleen Distance MS, RD,  LDN Please refer to [facility] for RD and/or RD on-call/weekend/after hours pager

## 2021-02-08 NOTE — Progress Notes (Signed)
Inpatient Diabetes Program Recommendations  AACE/ADA: New Consensus Statement on Inpatient Glycemic Control (2015)  Target Ranges:  Prepandial:   less than 140 mg/dL      Peak postprandial:   less than 180 mg/dL (1-2 hours)      Critically ill patients:  140 - 180 mg/dL   Lab Results  Component Value Date   GLUCAP 325 (H) 02/08/2021   HGBA1C 6.6 (H) 01/14/2021    Review of Glycemic Control Results for Edward Atkinson, Edward Atkinson" (MRN [id number]) as of 02/08/2021 14:17  Ref. Range 02/07/2021 12:16 02/07/2021 16:22 02/07/2021 21:23 02/08/2021 07:36 02/08/2021 11:34  Glucose-Capillary Latest Ref Range: 70 - 99 mg/dL 233 (H) 110 (H) 283 (H) 293 (H) 325 (H)   Diabetes history: DM 2 Outpatient Diabetes medications:  Amaryl 2 mg q Am, Metformin 500 mg tid with meals,  Current orders for Inpatient glycemic control:  Novolog resistant tid with meals and HS Novolog 10 units tid with meals Semglee 9 units bid Solumedrol 40 mg IV q 12 hours  Inpatient Diabetes Program Recommendations:    Blood sugars > goal. Please consider increasing Semglee to 14 units bid while on steroids.   Thanks,  Adah Perl, RN, BC-ADM Inpatient Diabetes Coordinator Pager [phone] (8a-5p)

## 2021-02-08 NOTE — H&P (View-Only) (Signed)
[facility] Vascular Consult Note  MRN : [id number]  Edward Atkinson is a 60 y.o. (January 15, 1961) male who presents with chief complaint of  Chief Complaint  Patient presents with   Respiratory Distress   History of Present Illness:  Edward Atkinson is a 60 year old male with medical history significant for chronic hypoxic respiratory failure on 2 L continuous nasal cannula in the setting of interstitial lung disease, type 2 diabetes mellitus, pulmonary embolism in 2020 now chronically anticoagulated on Eliquis, who is admitted to [facility] on 01/13/2021 with acute on chronic hypoxic respiratory failure after presenting from [facility] complaining of shortness of breath.    The patient reported progressive shortness of breath associated with subjective fever and new onset nonproductive cough. Denies any associated orthopnea, PND, or new onset peripheral edema. No recent chest pain, diaphoresis, palpitations, N/V, pre-syncope, or syncope.  Not associated with any wheezing, hemoptysis, new lower extremity erythema, or calf tenderness. Denies any recent trauma, travel, surgical procedures, or periods of prolonged diminished ambulatory status. No recent melena or hematochezia.    While he notes associated subjective fever, he denies any associated chills, rigors, or generalized myalgias. No recent headache, neck stiffness, rhinitis, rhinorrhea, sore throat, abdominal pain, diarrhea, or rash. No known recent COVID-19 exposures. Denies dysuria, gross hematuria, or change in urinary urgency/frequency.    The patient confirms chronic hypoxic respiratory failure on continuous 2 L nasal cannula at baseline in the setting of interstitial lung disease.  He reports compliance with his outpatient respiratory regimen which includes Breztri, Pulmicort nebulizer, and as needed albuterol inhaler.  He conveys that he is a lifelong non-smoker.  In the setting of his  interstitial lung disease, the patient reports that last week, that he has been weaned off of systemic corticosteroids by his outpatient pulmonologist, last dose of systemic corticosteroids occurring a few days before the onset of his presenting progressive shortness of breath.  ED Course:  Vital signs in the ED were notable for the following: Temperature max 98.0; initial heart rate 125 139, with most recent heart rate noted to be 118; blood pressure 111/73 -129/80; respiratory rate 21-39, with most recent respiratory rate noted to be 21; initial oxygen saturation found to be 70% on baseline 2 L nasal cannula, prompting initiation of BiPAP, with associated ensuing oxygen saturation of 98 to 99% on 100% FiO2; subsequently, BiPAP was transitioned to 6 L nasal cannula upon which patient's oxygen saturations have been in the range of 95 to 90% over the last 2 to 3 hours following discontinuation of aforementioned BiPAP.   Labs were notable for the following: CMP was notable for the following: Sodium 131 compared to 133 on 01/08/2021 as well as 10/22/2020, bicarbonate 23, creatinine 0.79 relative to baseline range of 0.751.0, glucose 181, liver enzymes were found to be within normal limits.  BNP 85.  High-sensitivity troponin initially noted to be 33, with repeat value trending up slightly to 22.  CBC notable for white blood cell count 12,300 compared to 7300 on 01/08/2021.  Lactic acid 0.9.  COVID-19/influenza PCR were checked in the ED today and found to be negative.  Blood cultures x2 collected prior to initiation of antibiotics in the ED.   EKG shows sinus tachycardia with heart rate 125, normal intervals, no evidence of T wave or ST changes, including no evidence of ST elevation.  CTA chest shows no evidence of acute pulmonary embolism, while showing evidence of progressive  multifocal bilateral groundglass airspace disease, greatest at the lung bases, potentially representing multifocal pneumonia, while  demonstrating no evidence of edema, pneumothorax, pleural effusion, and also showing evidence of stable bronchiectasis.   While in the ED, the following were administered: Solu-Medrol 125 mg IV x1, azithromycin, Rocephin.  Subsequently, the patient was admitted to the PCU for further evaluation and management of presenting acute on chronic hypoxic respiratory failure in the setting of interstitial lung disease exacerbation as well as potential underlying pneumonia with related severe sepsis.   Patient s/p HRCT with bilateral pulmonary fibrosis and bronchiectasis.  We discussed Afflo VEST therapy.  Reviewed with oncology will plan to remove chest port to allow patient to use vest therapy.  He is working with PT to wean O2.   Vascular surgery was consulted by Dr. Lanney Gins for removal of the patient's Port-A-Cath.  Current Facility-Administered Medications  Medication Dose Route Frequency Provider Last Rate Last Admin   acetaminophen (TYLENOL) suppository 650 mg  650 mg Rectal Q6H PRN Oswald Hillock, RPH       Or   acetaminophen (TYLENOL) tablet 650 mg  650 mg Oral Q6H PRN Oswald Hillock, RPH       ALPRAZolam Duanne Moron) tablet 0.25 mg  0.25 mg Oral QHS PRN Sharen Hones, MD       ALPRAZolam Duanne Moron) tablet 0.25 mg  0.25 mg Oral q morning Sharen Hones, MD   0.25 mg at 02/08/21 0802   apixaban (ELIQUIS) tablet 5 mg  5 mg Oral BID Lorna Dibble, RPH   5 mg at 02/08/21 1206   Chlorhexidine Gluconate Cloth 2 % PADS 6 each  6 each Topical Daily Kathie Dike, MD   6 each at 02/07/21 0948   digoxin (LANOXIN) tablet 0.125 mg  0.125 mg Oral Daily Kathie Dike, MD   0.125 mg at 02/08/21 1223   diltiazem (CARDIZEM CD) 24 hr capsule 120 mg  120 mg Oral Daily Ottie Glazier, MD   120 mg at 02/08/21 1206   diltiazem (CARDIZEM) injection 10 mg  10 mg Intravenous Q6H PRN Wyvonnia Dusky, MD   10 mg at 01/24/21 2328   docusate sodium (COLACE) capsule 200 mg  200 mg Oral BID Wyvonnia Dusky, MD   200  mg at 02/08/21 1222   feeding supplement (NEPRO CARB STEADY) liquid 237 mL  237 mL Oral TID BM Kathie Dike, MD 0 mL/hr at 01/21/21 2130 237 mL at 02/08/21 0100   furosemide (LASIX) injection 20 mg  20 mg Intravenous Daily Ottie Glazier, MD   20 mg at 02/08/21 1201   guaiFENesin (MUCINEX) 12 hr tablet 1,200 mg  1,200 mg Oral BID Kathie Dike, MD   1,200 mg at 02/08/21 1206   guaiFENesin-dextromethorphan (ROBITUSSIN DM) 100-10 MG/5ML syrup 5 mL  5 mL Oral Q4H PRN Kathie Dike, MD   5 mL at 02/04/21 0850   insulin aspart (novoLOG) injection 0-20 Units  0-20 Units Subcutaneous TID WC Kathie Dike, MD   15 Units at 02/08/21 1226   insulin aspart (novoLOG) injection 0-5 Units  0-5 Units Subcutaneous QHS Kathie Dike, MD   2 Units at 02/07/21 2231   insulin aspart (novoLOG) injection 10 Units  10 Units Subcutaneous TID WC Loletha Grayer, MD   10 Units at 02/08/21 1215   insulin glargine-yfgn (SEMGLEE) injection 9 Units  9 Units Subcutaneous BID Sharen Hones, MD   9 Units at 02/08/21 1224   ipratropium-albuterol (DUONEB) 0.5-2.5 (3) MG/3ML nebulizer solution 3 mL  3  mL Nebulization Q6H Sharen Hones, MD   3 mL at 02/08/21 1342   lactulose (CHRONULAC) 10 GM/15ML solution 30 g  30 g Oral BID PRN Loletha Grayer, MD       levalbuterol Penne Lash) nebulizer solution 1.25 mg  1.25 mg Nebulization Q4H PRN Howerter, Justin B, DO   1.25 mg at 01/17/21 2011   methylPREDNISolone sodium succinate (SOLU-MEDROL) 40 mg/mL injection 40 mg  40 mg Intravenous Q12H Ottie Glazier, MD   40 mg at 02/08/21 1200   morphine 2 MG/ML injection 1 mg  1 mg Intravenous Q3H PRN Ottie Glazier, MD   1 mg at 02/06/21 1412   multivitamin with minerals tablet 1 tablet  1 tablet Oral Daily Kathie Dike, MD   1 tablet at 02/08/21 1206   mycophenolate (CELLCEPT) capsule 750 mg  750 mg Oral BID Ottie Glazier, MD   750 mg at 02/08/21 1222   polyethylene glycol (MIRALAX / GLYCOLAX) packet 17 g  17 g Oral Daily Loletha Grayer, MD   17 g at 02/08/21 0802   simethicone (MYLICON) chewable tablet 80 mg  80 mg Oral QID PRN Loletha Grayer, MD   80 mg at 01/29/21 1411   sodium chloride (OCEAN) 0.65 % nasal spray 1 spray  1 spray Each Nare PRN Loletha Grayer, MD   1 spray at 01/30/21 2142   sodium chloride flush (NS) 0.9 % injection 10-40 mL  10-40 mL Intracatheter Q12H Kathie Dike, MD   10 mL at 02/07/21 2233   sodium chloride flush (NS) 0.9 % injection 10-40 mL  10-40 mL Intracatheter PRN Kathie Dike, MD   10 mL at 01/24/21 0915   sulfamethoxazole-trimethoprim (BACTRIM) 400-80 MG per tablet 1 tablet  1 tablet Oral Q12H Ottie Glazier, MD   1 tablet at 02/08/21 1222   traZODone (DESYREL) tablet 50 mg  50 mg Oral QHS PRN Sharion Settler, NP   50 mg at 02/03/21 2137   Past Medical History:  Diagnosis Date   Chronic respiratory failure with hypoxia ([place])    2L [place] continuous   COVID-19    Diabetes mellitus without complication ([place])    Diverticulitis    Follicular lymphoma of intra-abdominal lymph nodes ([place]) 02/15/2019   ILD (interstitial lung disease) ([place])    Past Surgical History:  Procedure Laterality Date   CHOLECYSTECTOMY     COLON RESECTION     DUE TO DIVERTICULITIS   FLEXIBLE BRONCHOSCOPY N/A 12/02/2019   Procedure: FLEXIBLE BRONCHOSCOPY;  Surgeon: Ottie Glazier, MD;  Location: [facility] ORS;  Service: Thoracic;  Laterality: N/A;   PORTA CATH INSERTION N/A 03/11/2019   Procedure: PORTA CATH INSERTION;  Surgeon: Algernon Huxley, MD;  Location: [place] [facility];  Service: Cardiovascular;  Laterality: N/A;   PULMONARY THROMBECTOMY N/A 01/29/2019   Procedure: PULMONARY THROMBECTOMY;  Surgeon: Algernon Huxley, MD;  Location: [place] [facility];  Service: Cardiovascular;  Laterality: N/A;   Social History Social History   Tobacco Use   Smoking status: Never   Smokeless tobacco: Never  Vaping Use   Vaping Use: Never used  Substance Use Topics   Alcohol use: Yes    Alcohol/week: 0.0 - 2.0  standard drinks    Comment: occasional   Drug use: Never   Family History Family History  Problem Relation Age of Onset   Diabetes Brother   Denies family history of peripheral artery disease, venous disease or renal disease.  Allergies  Allergen Reactions   Penicillins Rash    Did  it involve swelling of the face/tongue/throat, SOB, or low BP? No Did it involve sudden or severe rash/hives, skin peeling, or any reaction on the inside of your mouth or nose? No Did you need to seek medical attention at a hospital or doctor's office? No When did it last happen?      Unknown If all above answers are "NO", may proceed with cephalosporin use.   REVIEW OF SYSTEMS (Negative unless checked)  Constitutional: _0 Weight loss  _1 Fever  _2 Chills Cardiac: _3 Chest pain   _4 Chest pressure   _5 Palpitations   _6 Shortness of breath when laying flat   _7 Shortness of breath at rest   _8 Shortness of breath with exertion. Vascular:  _9 Pain in legs with walking   _10 Pain in legs at rest   _11 Pain in legs when laying flat   _12 Claudication   _13 Pain in feet when walking  _14 Pain in feet at rest  _15 Pain in feet when laying flat   _16 History of DVT   _17 Phlebitis   _18 Swelling in legs   _19 Varicose veins   _20 Non-healing ulcers Pulmonary:   _21 Uses home oxygen   _22 Productive cough   _23 Hemoptysis   _24 Wheeze  _25 COPD   _26 Asthma Neurologic:  _27 Dizziness  _28 Blackouts   _29 Seizures   _30 History of stroke   _31 History of TIA  _32 Aphasia   _33 Temporary blindness   _34 Dysphagia   _35 Weakness or numbness in arms   _36 Weakness or numbness in legs Musculoskeletal:  _37 Arthritis   _38 Joint swelling   _39 Joint pain   _40 Low back pain Hematologic:  _41 Easy bruising  _42 Easy bleeding   _43 Hypercoagulable state   _44 Anemic  _45 Hepatitis Gastrointestinal:  _46 Blood in stool   _47 Vomiting blood  _48 Gastroesophageal reflux/heartburn   _49 Difficulty swallowing. Genitourinary:  _50 Chronic kidney disease   _51 Difficult urination  _52 Frequent urination  _53 Burning with  urination   _54 Blood in urine Skin:  _55 Rashes   _56 Ulcers   _57 Wounds Psychological:  _58 History of anxiety   _59  History of major depression.  Physical Examination  Vitals:   02/08/21 0748 02/08/21 1003 02/08/21 1133 02/08/21 1342  BP:   129/79   Pulse: 81  96 (!) 102  Resp: _60 Temp:   98.1 F (36.7 C)   TempSrc:      SpO2: 98% 96% 94% 91%  Weight:      Height:       Body mass index is 19.82 kg/m. Gen:  WD/WN, NAD Head: [place]/AT, No temporalis wasting. Prominent temp pulse not noted. Ear/Nose/Throat: Hearing grossly intact, nares w/o erythema or drainage, oropharynx w/o Erythema/Exudate Eyes: Sclera non-icteric, conjunctiva clear Neck: Trachea midline.  No JVD.  Pulmonary:  Good air movement, respirations not labored, equal bilaterally.  Cardiac: RRR, normal S1, S2. Vascular:  Vessel Right Left  Radial Palpable Palpable  Ulnar Palpable Palpable   Gastrointestinal: soft, non-tender/non-distended. No guarding/reflex.  Musculoskeletal: M/S 5/5 throughout.  Extremities without ischemic changes.  No deformity or atrophy. No edema. Neurologic: Sensation grossly intact in extremities.  Symmetrical.  Speech is fluent. Motor exam as listed above. Psychiatric: Judgment intact, Mood & affect appropriate for pt's clinical situation. Dermatologic: No rashes or ulcers noted.  No cellulitis or open wounds. Lymph : No Cervical, Axillary, or Inguinal lymphadenopathy.  CBC Lab Results  Component Value Date   WBC 11.4 (H) 02/03/2021   HGB 10.6 (L) 02/03/2021   HCT 30.8 (L) 02/03/2021   MCV 83.0 02/03/2021   PLT 183 02/03/2021   BMET    Component Value Date/Time   NA 134 (L) 02/04/2021  0500   K 3.9 02/04/2021 0500   CL 95 (L) 02/04/2021 0500   CO2 34 (H) 02/04/2021 0500   GLUCOSE 146 (H) 02/04/2021 0500   BUN 17 02/04/2021 0500   CREATININE 0.50 (L) 02/04/2021 0500   CALCIUM 7.8 (L) 02/04/2021 0500   GFRNONAA >60 02/04/2021 0500   GFRAA >60 01/07/2020 0819   Estimated  Creatinine Clearance: 89.8 mL/min (A) (by C-G formula based on SCr of 0.5 mg/dL (L)).  COAG Lab Results  Component Value Date   INR 1.1 11/26/2019   INR 1.2 07/29/2019   INR 1.0 02/21/2019   Radiology CT Angio Chest PE W/Cm &/Or Wo Cm  Result Date: 01/13/2021 CLINICAL DATA:  Respiratory distress, hypoxia EXAM: CT ANGIOGRAPHY CHEST WITH CONTRAST TECHNIQUE: Multidetector CT imaging of the chest was performed using the standard protocol during bolus administration of intravenous contrast. Multiplanar CT image reconstructions and MIPs were obtained to evaluate the vascular anatomy. CONTRAST:  69m OMNIPAQUE IOHEXOL 350 MG/ML SOLN COMPARISON:  10/26/2020, 01/13/2021 FINDINGS: Cardiovascular: This is a technically adequate evaluation of the pulmonary vasculature. No filling defects or pulmonary emboli. The heart is unremarkable without pericardial effusion. No evidence of thoracic aortic aneurysm or dissection. Right chest wall port via internal jugular approach tip within the superior vena cava. Mediastinum/Nodes: No pathologic adenopathy within the mediastinum, hila, or axilla. Thyroid, trachea, and esophagus are unremarkable. Lungs/Pleura: There has been interval progression of the multifocal ground-glass airspace disease seen previously. Changes are most pronounced within the dependent lower lobes. No effusion or pneumothorax. The central airways are patent, with persistent bronchiectasis. Upper Abdomen: No acute abnormality. Musculoskeletal: No acute or destructive bony lesions. Reconstructed images demonstrate no additional findings. Review of the MIP images confirms the above findings. IMPRESSION: 1. No evidence of pulmonary embolus. 2. Progressive multifocal bilateral ground-glass airspace disease, greatest at the lung bases. The differential diagnosis would include progressive postinflammatory scarring and fibrosis, multifocal atypical pneumonia, or hypersensitivity/drug toxicity. 3. Stable  bronchiectasis. Electronically Signed   By: MRanda NgoM.D.   On: 01/13/2021 17:13   CT Chest High Resolution  Result Date: 02/07/2021 CLINICAL DATA:  Inpatient. Interstitial lung disease. Worsening dyspnea. Chronic hypoxemia post COVID-19. History of lymphoma treated with Rituxan. EXAM: CT CHEST WITHOUT CONTRAST TECHNIQUE: Multidetector CT imaging of the chest was performed following the standard protocol without intravenous contrast. High resolution imaging of the lungs, as well as inspiratory and expiratory imaging, was performed. COMPARISON:  02/05/2021 chest radiograph. 01/13/2021 chest CT angiogram. FINDINGS: Cardiovascular: Normal heart size. No significant pericardial effusion/thickening. Right internal jugular central venous catheter terminates at the cavoatrial junction. Normal course and caliber of the thoracic aorta. Stable dilated main pulmonary artery (3.7 cm diameter). Mediastinum/Nodes: No discrete thyroid nodules. Unremarkable esophagus. No pathologically enlarged axillary, mediastinal or hilar lymph nodes, noting limited sensitivity for the detection of hilar adenopathy on this noncontrast study. Lungs/Pleura: No pneumothorax. No pleural effusion. Severe patchy confluent peribronchovascular and peripheral ground-glass opacity, reticulation, traction bronchiectasis, architectural distortion and volume loss in both lungs with a basilar predominance. The ground-glass opacities have mildly improved since 01/13/2021 chest CT. The bronchiectasis, distortion and volume loss appear slightly worsened. No significant lobular air trapping or evidence of tracheobronchomalacia on the expiration sequence. No acute consolidative airspace disease, lung masses or significant pulmonary nodules. No frank honeycombing. Upper abdomen: Chronic mild elevation of the right hemidiaphragm. Musculoskeletal: No aggressive appearing focal osseous lesions. Mild thoracic spondylosis. IMPRESSION: 1. Spectrum of findings  compatible with severe basilar predominant fibrotic interstitial lung disease with  ground-glass and no frank honeycombing predominance. Ground-glass opacities have slightly improved and bronchiectasis and other findings of fibrosis have slightly worsened since recent 01/13/2021 chest CT angiogram study. Findings are most compatible with a severe rapidly progressive postinflammatory fibrosis or fibrotic NSIP pattern. Findings are suggestive of an alternative diagnosis (not UIP) per consensus guidelines: Diagnosis of Idiopathic Pulmonary Fibrosis: An Official ATS/ERS/JRS/ALAT Clinical Practice Guideline. Ferguson, Iss 5, [phone], Dec 31 2016. 2. Stable dilated main pulmonary artery, suggesting chronic pulmonary arterial hypertension. 3. Chronic mild elevation of the right hemidiaphragm. Electronically Signed   By: Ilona Sorrel M.D.   On: 02/07/2021 17:29   DG Chest Port 1 View  Result Date: 02/05/2021 CLINICAL DATA:  Bilateral pulmonary infiltrates on chest x-ray. EXAM: PORTABLE CHEST 1 VIEW COMPARISON:  Chest radiograph 02/01/2021 FINDINGS: Power injectable right IJ port central venous catheter tip projects at the level of the superior cavoatrial junction. Unchanged low lung volumes with lower lobe predominant interstitial opacities, left greater than right. No new focal consolidation, pleural effusion, or pneumothorax. Heart is normal in size. Surgical clips in the right upper abdomen. IMPRESSION: Bilateral interstitial airspace opacities are unchanged compared to 02/01/2021. No new focal consolidation, pleural effusion, or pneumothorax. Electronically Signed   By: Ileana Roup M.D.   On: 02/05/2021 10:59   DG Chest Port 1 View  Result Date: 02/01/2021 CLINICAL DATA:  Shortness of breath, edema EXAM: PORTABLE CHEST 1 VIEW COMPARISON:  01/30/2021 FINDINGS: Stable positioning of right IJ approach Port-A-Cath. Heart size within normal limits. Low lung volumes. Slight interval worsening  of bilateral airspace opacities, left worse than right. No pleural effusion or pneumothorax. IMPRESSION: Slight interval worsening of bilateral airspace opacities, left worse than right. Electronically Signed   By: Davina Poke D.O.   On: 02/01/2021 14:37   DG Chest Port 1 View  Result Date: 01/30/2021 CLINICAL DATA:  Acute on chronic hypoxemic respiratory failure. EXAM: PORTABLE CHEST 1 VIEW COMPARISON:  January 28, 2021 FINDINGS: No pneumothorax. Stable cardiomediastinal silhouette and right Port-A-Cath. Left-greater-than-right pulmonary opacities/infiltrates. No other interval changes. IMPRESSION: 1. Persistent low lung volumes and bilateral pulmonary infiltrates. No interval changes. Electronically Signed   By: Dorise Bullion III M.D.   On: 01/30/2021 08:36   DG Chest Port 1 View  Result Date: 01/28/2021 CLINICAL DATA:  Shortness of breath. EXAM: PORTABLE CHEST 1 VIEW COMPARISON:  Multiple recent chest films. FINDINGS: The right IJ power port is stable. Persistent low lung volumes and bilateral pulmonary infiltrates. No definite pleural effusion or pneumothorax. IMPRESSION: Persistent low lung volumes and bilateral pulmonary infiltrates. Electronically Signed   By: Marijo Sanes M.D.   On: 01/28/2021 16:03   DG Chest Port 1 View  Result Date: 01/26/2021 CLINICAL DATA:  Shortness of breath, abnormal chest x-ray EXAM: PORTABLE CHEST 1 VIEW COMPARISON:  01/24/2021 FINDINGS: Right Port-A-Cath remains in place, unchanged. Low lung volumes with bilateral airspace opacities, most pronounced in the left perihilar and lower lobe, unchanged since prior study. No visible effusions or pneumothorax. Heart is normal size. IMPRESSION: Low lung volumes with bilateral airspace disease, left-greater-than-right. No significant change. Electronically Signed   By: Rolm Baptise M.D.   On: 01/26/2021 09:42   DG Chest Port 1 View  Result Date: 01/24/2021 CLINICAL DATA:  Follow-up multifocal pneumonia. EXAM:  PORTABLE CHEST 1 VIEW COMPARISON:  01/22/2021 and older exams. FINDINGS: Mild interval decrease in the airspace lung opacities, most evident on the left. Lung volumes remain low. No new lung  abnormalities. No visualized pleural effusion.  No pneumothorax. Right internal jugular Port-A-Cath is stable. IMPRESSION: 1. Mild interval improvement in lung aeration with decreased airspace lung opacities, most notably on the left. Electronically Signed   By: Lajean Manes M.D.   On: 01/24/2021 07:57   DG Chest Port 1 View  Result Date: 01/22/2021 CLINICAL DATA:  Hypoxia, shortness of breath. EXAM: PORTABLE CHEST 1 VIEW COMPARISON:  January 20, 2021. FINDINGS: Stable cardiomediastinal silhouette. Right internal jugular Port-A-Cath is unchanged in position. Hypoinflation of the lungs is noted. Stable bilateral lung opacities are noted, left greater than right, most consistent with multifocal pneumonia. Bony thorax is unremarkable. IMPRESSION: Hypoinflation of the lungs. Stable bilateral lung opacities are noted, left greater than right, most consistent with multifocal pneumonia. Electronically Signed   By: Marijo Conception M.D.   On: 01/22/2021 19:26   DG Chest Port 1 View  Result Date: 01/20/2021 CLINICAL DATA:  Pneumonitis EXAM: PORTABLE CHEST 1 VIEW COMPARISON:  01/19/2021 FINDINGS: Shallow inspiration. Patchy infiltrates in the left lung and right lung base, similar to prior study and likely multifocal pneumonia. No pleural effusions. No pneumothorax. Mediastinal contours appear intact. Heart size is normal. Power port type central venous catheter on the right with tip over the cavoatrial junction region. Surgical clips in the right upper quadrant. IMPRESSION: Shallow inspiration. Patchy infiltrates in both lungs, likely multifocal pneumonia, without change. Electronically Signed   By: Lucienne Capers M.D.   On: 01/20/2021 03:56   DG Chest Port 1 View  Result Date: 01/19/2021 CLINICAL DATA:  Abnormality on  previous chest x-ray in a 60 year old male. EXAM: PORTABLE CHEST 1 VIEW COMPARISON:  CT from January 13, 2021, chest x-ray January 18, 2021. FINDINGS: RIGHT-sided Port-A-Cath terminates at the caval to atrial junction. Trachea is midline. Cardiomediastinal contours are stable. Interstitial and airspace opacities throughout the chest worse in the LEFT mid to upper chest, RIGHT upper chest lung bases bilaterally with similar appearance. On limited assessment there is no acute skeletal process. EKG leads project over the chest. IMPRESSION: No interval change in the appearance of the chest since previous imaging with continued decreased lung volumes and patchy bilateral opacities. Electronically Signed   By: Zetta Bills M.D.   On: 01/19/2021 07:59   DG Chest Port 1 View  Result Date: 01/18/2021 CLINICAL DATA:  Respiratory distress EXAM: PORTABLE CHEST 1 VIEW COMPARISON:  01/17/2021 FINDINGS: Cardiac shadow is stable. Right-sided chest wall port is again seen. Lungs are hypoinflated. Patchy airspace opacities again identified in both lungs left greater than right but stable from the prior exam. IMPRESSION: Patchy airspace opacities bilaterally stable from the prior exam. Electronically Signed   By: Inez Catalina M.D.   On: 01/18/2021 17:24   DG Chest Port 1 View  Result Date: 01/17/2021 CLINICAL DATA:  Worsening shortness of breath. EXAM: PORTABLE CHEST 1 VIEW COMPARISON:  01/14/2021 FINDINGS: 0826 hours. Low volume film. Patchy airspace disease again noted in both lungs, left greater than right. Cardiopericardial silhouette is at upper limits of normal for size. Right Port-A-Cath again noted. Telemetry leads overlie the chest. IMPRESSION: Low volume film with patchy bilateral airspace disease, left greater than right. No substantial change. Electronically Signed   By: Misty Stanley M.D.   On: 01/17/2021 08:53   DG Chest Port 1 View  Result Date: 01/14/2021 CLINICAL DATA:  Shortness of breath.  History  of cancer.  Diabetes. EXAM: PORTABLE CHEST 1 VIEW.  Patient is rotated. COMPARISON:  Chest x-ray 01/13/2021, CT chest  01/13/2021 FINDINGS: Accessed right chest wall Port-A-Cath in stable position. The heart and mediastinal contours are within normal limits. Low lung volumes with persistent diffuse patchy airspace opacities. No pleural effusion. No pneumothorax. No acute osseous abnormality. Right upper quadrant surgical clips. IMPRESSION: Low lung volumes with persistent diffuse patchy airspace opacities. Electronically Signed   By: Iven Finn M.D.   On: 01/14/2021 21:08   DG Chest Port 1 View  Result Date: 01/13/2021 CLINICAL DATA:  Shortness of breath EXAM: PORTABLE CHEST 1 VIEW COMPARISON:  Chest radiograph 07/29/2019 FINDINGS: There is a right chest wall port with the tip terminating in the lower SVC/cavoatrial junction. The cardiomediastinal silhouette is within normal limits. Lung volumes are low with unchanged asymmetric elevation of the right hemidiaphragm. There are increased interstitial markings with diffuse reticular opacities throughout both lungs. There is no focal consolidation. There is no significant pleural effusion. There is no pneumothorax. There is no acute osseous abnormality. There is gaseous distention of the stomach and large bowel in the right upper quadrant. IMPRESSION: Low lung volumes with suspected mild pulmonary interstitial edema. Atypical/viral infection could have a similar appearance. Electronically Signed   By: Valetta Mole M.D.   On: 01/13/2021 14:24    Assessment/Plan Edward Atkinson is a 61 year old male with medical history significant for chronic hypoxic respiratory failure on 2 L continuous nasal cannula in the setting of interstitial lung disease, type 2 diabetes mellitus, pulmonary embolism in 2020 now chronically anticoagulated on Eliquis, who is admitted to [facility] on 01/13/2021 with acute on chronic hypoxic respiratory failure after  presenting from home to [facility] ED complaining of shortness of breath.   1.  Possible removal of Port-A-Cath: Patient currently has a right-sided Port-A-Cath.  Currently has been accessed while he has been inpatient.  Placed for follicular lymphoma. The patient is s/p HRCT with bilateral pulmonary fibrosis and bronchiectasis.  Patient will be starting the [employer] VEST therapy.  Reviewed with oncology - OK to remove chest port to allow patient to use vest therapy.  We will plan on removal of the patient's Port-A-Cath tomorrow with Dr. Delana Meyer.  Procedure, risks and benefits were explained to the patient.  All questions were answered.  The patient wishes to proceed.  2.  Follicular lymphoma: Currently has a right Port-A-Cath   3.  Acute on chronic toxic respiratory failure in the setting of interstitial lung disease exacerbation: Able to wean down to about 10 L. Followed by pulmonary  Discussed with Dr. Francene Castle, PA-C  02/08/2021 2:00 PM  This note was created with Dragon medical transcription system.  Any error is purely unintentional.

## 2021-02-08 NOTE — Consult Note (Signed)
[name] Vascular Consult Note  MRN : [id number]  Edward Atkinson is a 60 y.o. (02/06/1961) male who presents with chief complaint of  Chief Complaint  Patient presents with   Respiratory Distress   History of Present Illness:  Edward Atkinson is a 60 year old male with medical history significant for chronic hypoxic respiratory failure on 2 L continuous nasal cannula in the setting of interstitial lung disease, type 2 diabetes mellitus, pulmonary embolism in 2020 now chronically anticoagulated on Eliquis, who is admitted to [facility] on 01/13/2021 with acute on chronic hypoxic respiratory failure after presenting from home to [facility] ED complaining of shortness of breath.    The patient reported progressive shortness of breath associated with subjective fever and new onset nonproductive cough. Denies any associated orthopnea, PND, or new onset peripheral edema. No recent chest pain, diaphoresis, palpitations, N/V, pre-syncope, or syncope.  Not associated with any wheezing, hemoptysis, new lower extremity erythema, or calf tenderness. Denies any recent trauma, travel, surgical procedures, or periods of prolonged diminished ambulatory status. No recent melena or hematochezia.    While he notes associated subjective fever, he denies any associated chills, rigors, or generalized myalgias. No recent headache, neck stiffness, rhinitis, rhinorrhea, sore throat, abdominal pain, diarrhea, or rash. No known recent COVID-19 exposures. Denies dysuria, gross hematuria, or change in urinary urgency/frequency.    The patient confirms chronic hypoxic respiratory failure on continuous 2 L nasal cannula at baseline in the setting of interstitial lung disease.  He reports compliance with his outpatient respiratory regimen which includes Breztri, Pulmicort nebulizer, and as needed albuterol inhaler.  He conveys that he is a lifelong non-smoker.  In the setting of his  interstitial lung disease, the patient reports that last week, that he has been weaned off of systemic corticosteroids by his outpatient pulmonologist, last dose of systemic corticosteroids occurring a few days before the onset of his presenting progressive shortness of breath.  ED Course:  Vital signs in the ED were notable for the following: Temperature max 98.0; initial heart rate 125 139, with most recent heart rate noted to be 118; blood pressure 111/73 -129/80; respiratory rate 21-39, with most recent respiratory rate noted to be 21; initial oxygen saturation found to be 70% on baseline 2 L nasal cannula, prompting initiation of BiPAP, with associated ensuing oxygen saturation of 98 to 99% on 100% FiO2; subsequently, BiPAP was transitioned to 6 L nasal cannula upon which patient's oxygen saturations have been in the range of 95 to 90% over the last 2 to 3 hours following discontinuation of aforementioned BiPAP.   Labs were notable for the following: CMP was notable for the following: Sodium 131 compared to 133 on 01/08/2021 as well as 10/22/2020, bicarbonate 23, creatinine 0.79 relative to baseline range of 0.751.0, glucose 181, liver enzymes were found to be within normal limits.  BNP 85.  High-sensitivity troponin initially noted to be 33, with repeat value trending up slightly to 22.  CBC notable for white blood cell count 12,300 compared to 7300 on 01/08/2021.  Lactic acid 0.9.  COVID-19/influenza PCR were checked in the ED today and found to be negative.  Blood cultures x2 collected prior to initiation of antibiotics in the ED.   EKG shows sinus tachycardia with heart rate 125, normal intervals, no evidence of T wave or ST changes, including no evidence of ST elevation.  CTA chest shows no evidence of acute pulmonary embolism, while showing evidence of progressive   multifocal bilateral groundglass airspace disease, greatest at the lung bases, potentially representing multifocal pneumonia, while  demonstrating no evidence of edema, pneumothorax, pleural effusion, and also showing evidence of stable bronchiectasis.   While in the ED, the following were administered: Solu-Medrol 125 mg IV x1, azithromycin, Rocephin.  Subsequently, the patient was admitted to the PCU for further evaluation and management of presenting acute on chronic hypoxic respiratory failure in the setting of interstitial lung disease exacerbation as well as potential underlying pneumonia with related severe sepsis.   Patient s/p HRCT with bilateral pulmonary fibrosis and bronchiectasis.  We discussed Afflo VEST therapy.  Reviewed with oncology will plan to remove chest port to allow patient to use vest therapy.  He is working with PT to wean O2.   Vascular surgery was consulted by Dr. Lanney Gins for removal of the patient's Port-A-Cath.  Current Facility-Administered Medications  Medication Dose Route Frequency Provider Last Rate Last Admin   acetaminophen (TYLENOL) suppository 650 mg  650 mg Rectal Q6H PRN Oswald Hillock, RPH       Or   acetaminophen (TYLENOL) tablet 650 mg  650 mg Oral Q6H PRN Oswald Hillock, RPH       ALPRAZolam Duanne Moron) tablet 0.25 mg  0.25 mg Oral QHS PRN Sharen Hones, MD       ALPRAZolam Duanne Moron) tablet 0.25 mg  0.25 mg Oral q morning Sharen Hones, MD   0.25 mg at 02/08/21 0802   apixaban (ELIQUIS) tablet 5 mg  5 mg Oral BID Lorna Dibble, RPH   5 mg at 02/08/21 1206   Chlorhexidine Gluconate Cloth 2 % PADS 6 each  6 each Topical Daily Kathie Dike, MD   6 each at 02/07/21 0948   digoxin (LANOXIN) tablet 0.125 mg  0.125 mg Oral Daily Kathie Dike, MD   0.125 mg at 02/08/21 1223   diltiazem (CARDIZEM CD) 24 hr capsule 120 mg  120 mg Oral Daily Ottie Glazier, MD   120 mg at 02/08/21 1206   diltiazem (CARDIZEM) injection 10 mg  10 mg Intravenous Q6H PRN Wyvonnia Dusky, MD   10 mg at 01/24/21 2328   docusate sodium (COLACE) capsule 200 mg  200 mg Oral BID Wyvonnia Dusky, MD   200  mg at 02/08/21 1222   feeding supplement (NEPRO CARB STEADY) liquid 237 mL  237 mL Oral TID BM Kathie Dike, MD 0 mL/hr at 01/21/21 2130 237 mL at 02/08/21 0100   furosemide (LASIX) injection 20 mg  20 mg Intravenous Daily Ottie Glazier, MD   20 mg at 02/08/21 1201   guaiFENesin (MUCINEX) 12 hr tablet 1,200 mg  1,200 mg Oral BID Kathie Dike, MD   1,200 mg at 02/08/21 1206   guaiFENesin-dextromethorphan (ROBITUSSIN DM) 100-10 MG/5ML syrup 5 mL  5 mL Oral Q4H PRN Kathie Dike, MD   5 mL at 02/04/21 0850   insulin aspart (novoLOG) injection 0-20 Units  0-20 Units Subcutaneous TID WC Kathie Dike, MD   15 Units at 02/08/21 1226   insulin aspart (novoLOG) injection 0-5 Units  0-5 Units Subcutaneous QHS Kathie Dike, MD   2 Units at 02/07/21 2231   insulin aspart (novoLOG) injection 10 Units  10 Units Subcutaneous TID WC Loletha Grayer, MD   10 Units at 02/08/21 1215   insulin glargine-yfgn (SEMGLEE) injection 9 Units  9 Units Subcutaneous BID Sharen Hones, MD   9 Units at 02/08/21 1224   ipratropium-albuterol (DUONEB) 0.5-2.5 (3) MG/3ML nebulizer solution 3 mL  3  mL Nebulization Q6H Sharen Hones, MD   3 mL at 02/08/21 1342   lactulose (CHRONULAC) 10 GM/15ML solution 30 g  30 g Oral BID PRN Loletha Grayer, MD       levalbuterol Penne Lash) nebulizer solution 1.25 mg  1.25 mg Nebulization Q4H PRN Howerter, Justin B, DO   1.25 mg at 01/17/21 2011   methylPREDNISolone sodium succinate (SOLU-MEDROL) 40 mg/mL injection 40 mg  40 mg Intravenous Q12H Ottie Glazier, MD   40 mg at 02/08/21 1200   morphine 2 MG/ML injection 1 mg  1 mg Intravenous Q3H PRN Ottie Glazier, MD   1 mg at 02/06/21 1412   multivitamin with minerals tablet 1 tablet  1 tablet Oral Daily Kathie Dike, MD   1 tablet at 02/08/21 1206   mycophenolate (CELLCEPT) capsule 750 mg  750 mg Oral BID Ottie Glazier, MD   750 mg at 02/08/21 1222   polyethylene glycol (MIRALAX / GLYCOLAX) packet 17 g  17 g Oral Daily Loletha Grayer, MD   17 g at 02/08/21 0802   simethicone (MYLICON) chewable tablet 80 mg  80 mg Oral QID PRN Loletha Grayer, MD   80 mg at 01/29/21 1411   sodium chloride (OCEAN) 0.65 % nasal spray 1 spray  1 spray Each Nare PRN Loletha Grayer, MD   1 spray at 01/30/21 2142   sodium chloride flush (NS) 0.9 % injection 10-40 mL  10-40 mL Intracatheter Q12H Kathie Dike, MD   10 mL at 02/07/21 2233   sodium chloride flush (NS) 0.9 % injection 10-40 mL  10-40 mL Intracatheter PRN Kathie Dike, MD   10 mL at 01/24/21 0915   sulfamethoxazole-trimethoprim (BACTRIM) 400-80 MG per tablet 1 tablet  1 tablet Oral Q12H Ottie Glazier, MD   1 tablet at 02/08/21 1222   traZODone (DESYREL) tablet 50 mg  50 mg Oral QHS PRN Sharion Settler, NP   50 mg at 02/03/21 2137   Past Medical History:  Diagnosis Date   Chronic respiratory failure with hypoxia ([place])    2L [place] continuous   COVID-19    Diabetes mellitus without complication ([place])    Diverticulitis    Follicular lymphoma of intra-abdominal lymph nodes ([place]) 02/15/2019   ILD (interstitial lung disease) ([place])    Past Surgical History:  Procedure Laterality Date   CHOLECYSTECTOMY     COLON RESECTION     DUE TO DIVERTICULITIS   FLEXIBLE BRONCHOSCOPY N/A 12/02/2019   Procedure: FLEXIBLE BRONCHOSCOPY;  Surgeon: Ottie Glazier, MD;  Location: [facility] ORS;  Service: Thoracic;  Laterality: N/A;   PORTA CATH INSERTION N/A 03/11/2019   Procedure: PORTA CATH INSERTION;  Surgeon: Algernon Huxley, MD;  Location: [place] [facility];  Service: Cardiovascular;  Laterality: N/A;   PULMONARY THROMBECTOMY N/A 01/29/2019   Procedure: PULMONARY THROMBECTOMY;  Surgeon: Algernon Huxley, MD;  Location: [place] [facility];  Service: Cardiovascular;  Laterality: N/A;   Social History Social History   Tobacco Use   Smoking status: Never   Smokeless tobacco: Never  Vaping Use   Vaping Use: Never used  Substance Use Topics   Alcohol use: Yes    Alcohol/week: 0.0 - 2.0  standard drinks    Comment: occasional   Drug use: Never   Family History Family History  Problem Relation Age of Onset   Diabetes Brother   Denies family history of peripheral artery disease, venous disease or renal disease.  Allergies  Allergen Reactions   Penicillins Rash    Did  it involve swelling of the face/tongue/throat, SOB, or low BP? No Did it involve sudden or severe rash/hives, skin peeling, or any reaction on the inside of your mouth or nose? No Did you need to seek medical attention at a hospital or doctor's office? No When did it last happen?      Unknown If all above answers are "NO", may proceed with cephalosporin use.   REVIEW OF SYSTEMS (Negative unless checked)  Constitutional: _0 Weight loss  _1 Fever  _2 Chills Cardiac: _3 Chest pain   _4 Chest pressure   _5 Palpitations   _6 Shortness of breath when laying flat   _7 Shortness of breath at rest   _8 Shortness of breath with exertion. Vascular:  _9 Pain in legs with walking   _10 Pain in legs at rest   _11 Pain in legs when laying flat   _12 Claudication   _13 Pain in feet when walking  _14 Pain in feet at rest  _15 Pain in feet when laying flat   _16 History of DVT   _17 Phlebitis   _18 Swelling in legs   _19 Varicose veins   _20 Non-healing ulcers Pulmonary:   _21 Uses home oxygen   _22 Productive cough   _23 Hemoptysis   _24 Wheeze  _25 COPD   _26 Asthma Neurologic:  _27 Dizziness  _28 Blackouts   _29 Seizures   _30 History of stroke   _31 History of TIA  _32 Aphasia   _33 Temporary blindness   _34 Dysphagia   _35 Weakness or numbness in arms   _36 Weakness or numbness in legs Musculoskeletal:  _37 Arthritis   _38 Joint swelling   _39 Joint pain   _40 Low back pain Hematologic:  _41 Easy bruising  _42 Easy bleeding   _43 Hypercoagulable state   _44 Anemic  _45 Hepatitis Gastrointestinal:  _46 Blood in stool   _47 Vomiting blood  _48 Gastroesophageal reflux/heartburn   _49 Difficulty swallowing. Genitourinary:  _50 Chronic kidney disease   _51 Difficult urination  _52 Frequent urination  _53 Burning with  urination   _54 Blood in urine Skin:  _55 Rashes   _56 Ulcers   _57 Wounds Psychological:  _58 History of anxiety   _59  History of major depression.  Physical Examination  Vitals:   02/08/21 0748 02/08/21 1003 02/08/21 1133 02/08/21 1342  BP:   129/79   Pulse: 81  96 (!) 102  Resp: _60 Temp:   98.1 F (36.7 C)   TempSrc:      SpO2: 98% 96% 94% 91%  Weight:      Height:       Body mass index is 19.82 kg/m. Gen:  WD/WN, NAD Head: [place]/AT, No temporalis wasting. Prominent temp pulse not noted. Ear/Nose/Throat: Hearing grossly intact, nares w/o erythema or drainage, oropharynx w/o Erythema/Exudate Eyes: Sclera non-icteric, conjunctiva clear Neck: Trachea midline.  No JVD.  Pulmonary:  Good air movement, respirations not labored, equal bilaterally.  Cardiac: RRR, normal S1, S2. Vascular:  Vessel Right Left  Radial Palpable Palpable  Ulnar Palpable Palpable   Gastrointestinal: soft, non-tender/non-distended. No guarding/reflex.  Musculoskeletal: M/S 5/5 throughout.  Extremities without ischemic changes.  No deformity or atrophy. No edema. Neurologic: Sensation grossly intact in extremities.  Symmetrical.  Speech is fluent. Motor exam as listed above. Psychiatric: Judgment intact, Mood & affect appropriate for pt's clinical situation. Dermatologic: No rashes or ulcers noted.  No cellulitis or open wounds. Lymph : No Cervical, Axillary, or Inguinal lymphadenopathy.  CBC Lab Results  Component Value Date   WBC 11.4 (H) 02/03/2021   HGB 10.6 (L) 02/03/2021   HCT 30.8 (L) 02/03/2021   MCV 83.0 02/03/2021   PLT 183 02/03/2021   BMET    Component Value Date/Time   NA 134 (L) 02/04/2021  0500   K 3.9 02/04/2021 0500   CL 95 (L) 02/04/2021 0500   CO2 34 (H) 02/04/2021 0500   GLUCOSE 146 (H) 02/04/2021 0500   BUN 17 02/04/2021 0500   CREATININE 0.50 (L) 02/04/2021 0500   CALCIUM 7.8 (L) 02/04/2021 0500   GFRNONAA >60 02/04/2021 0500   GFRAA >60 01/07/2020 0819   Estimated  Creatinine Clearance: 89.8 mL/min (A) (by C-G formula based on SCr of 0.5 mg/dL (L)).  COAG Lab Results  Component Value Date   INR 1.1 11/26/2019   INR 1.2 07/29/2019   INR 1.0 02/21/2019   Radiology CT Angio Chest PE W/Cm &/Or Wo Cm  Result Date: 01/13/2021 CLINICAL DATA:  Respiratory distress, hypoxia EXAM: CT ANGIOGRAPHY CHEST WITH CONTRAST TECHNIQUE: Multidetector CT imaging of the chest was performed using the standard protocol during bolus administration of intravenous contrast. Multiplanar CT image reconstructions and MIPs were obtained to evaluate the vascular anatomy. CONTRAST:  51m OMNIPAQUE IOHEXOL 350 MG/ML SOLN COMPARISON:  10/26/2020, 01/13/2021 FINDINGS: Cardiovascular: This is a technically adequate evaluation of the pulmonary vasculature. No filling defects or pulmonary emboli. The heart is unremarkable without pericardial effusion. No evidence of thoracic aortic aneurysm or dissection. Right chest wall port via internal jugular approach tip within the superior vena cava. Mediastinum/Nodes: No pathologic adenopathy within the mediastinum, hila, or axilla. Thyroid, trachea, and esophagus are unremarkable. Lungs/Pleura: There has been interval progression of the multifocal ground-glass airspace disease seen previously. Changes are most pronounced within the dependent lower lobes. No effusion or pneumothorax. The central airways are patent, with persistent bronchiectasis. Upper Abdomen: No acute abnormality. Musculoskeletal: No acute or destructive bony lesions. Reconstructed images demonstrate no additional findings. Review of the MIP images confirms the above findings. IMPRESSION: 1. No evidence of pulmonary embolus. 2. Progressive multifocal bilateral ground-glass airspace disease, greatest at the lung bases. The differential diagnosis would include progressive postinflammatory scarring and fibrosis, multifocal atypical pneumonia, or hypersensitivity/drug toxicity. 3. Stable  bronchiectasis. Electronically Signed   By: MRanda NgoM.D.   On: 01/13/2021 17:13   CT Chest High Resolution  Result Date: 02/07/2021 CLINICAL DATA:  Inpatient. Interstitial lung disease. Worsening dyspnea. Chronic hypoxemia post COVID-19. History of lymphoma treated with Rituxan. EXAM: CT CHEST WITHOUT CONTRAST TECHNIQUE: Multidetector CT imaging of the chest was performed following the standard protocol without intravenous contrast. High resolution imaging of the lungs, as well as inspiratory and expiratory imaging, was performed. COMPARISON:  02/05/2021 chest radiograph. 01/13/2021 chest CT angiogram. FINDINGS: Cardiovascular: Normal heart size. No significant pericardial effusion/thickening. Right internal jugular central venous catheter terminates at the cavoatrial junction. Normal course and caliber of the thoracic aorta. Stable dilated main pulmonary artery (3.7 cm diameter). Mediastinum/Nodes: No discrete thyroid nodules. Unremarkable esophagus. No pathologically enlarged axillary, mediastinal or hilar lymph nodes, noting limited sensitivity for the detection of hilar adenopathy on this noncontrast study. Lungs/Pleura: No pneumothorax. No pleural effusion. Severe patchy confluent peribronchovascular and peripheral ground-glass opacity, reticulation, traction bronchiectasis, architectural distortion and volume loss in both lungs with a basilar predominance. The ground-glass opacities have mildly improved since 01/13/2021 chest CT. The bronchiectasis, distortion and volume loss appear slightly worsened. No significant lobular air trapping or evidence of tracheobronchomalacia on the expiration sequence. No acute consolidative airspace disease, lung masses or significant pulmonary nodules. No frank honeycombing. Upper abdomen: Chronic mild elevation of the right hemidiaphragm. Musculoskeletal: No aggressive appearing focal osseous lesions. Mild thoracic spondylosis. IMPRESSION: 1. Spectrum of findings  compatible with severe basilar predominant fibrotic interstitial lung disease with  ground-glass and no frank honeycombing predominance. Ground-glass opacities have slightly improved and bronchiectasis and other findings of fibrosis have slightly worsened since recent 01/13/2021 chest CT angiogram study. Findings are most compatible with a severe rapidly progressive postinflammatory fibrosis or fibrotic NSIP pattern. Findings are suggestive of an alternative diagnosis (not UIP) per consensus guidelines: Diagnosis of Idiopathic Pulmonary Fibrosis: An Official ATS/ERS/JRS/ALAT Clinical Practice Guideline. Ferguson, Iss 5, [phone], Dec 31 2016. 2. Stable dilated main pulmonary artery, suggesting chronic pulmonary arterial hypertension. 3. Chronic mild elevation of the right hemidiaphragm. Electronically Signed   By: Ilona Sorrel M.D.   On: 02/07/2021 17:29   DG Chest Port 1 View  Result Date: 02/05/2021 CLINICAL DATA:  Bilateral pulmonary infiltrates on chest x-ray. EXAM: PORTABLE CHEST 1 VIEW COMPARISON:  Chest radiograph 02/01/2021 FINDINGS: Power injectable right IJ port central venous catheter tip projects at the level of the superior cavoatrial junction. Unchanged low lung volumes with lower lobe predominant interstitial opacities, left greater than right. No new focal consolidation, pleural effusion, or pneumothorax. Heart is normal in size. Surgical clips in the right upper abdomen. IMPRESSION: Bilateral interstitial airspace opacities are unchanged compared to 02/01/2021. No new focal consolidation, pleural effusion, or pneumothorax. Electronically Signed   By: Ileana Roup M.D.   On: 02/05/2021 10:59   DG Chest Port 1 View  Result Date: 02/01/2021 CLINICAL DATA:  Shortness of breath, edema EXAM: PORTABLE CHEST 1 VIEW COMPARISON:  01/30/2021 FINDINGS: Stable positioning of right IJ approach Port-A-Cath. Heart size within normal limits. Low lung volumes. Slight interval worsening  of bilateral airspace opacities, left worse than right. No pleural effusion or pneumothorax. IMPRESSION: Slight interval worsening of bilateral airspace opacities, left worse than right. Electronically Signed   By: Davina Poke D.O.   On: 02/01/2021 14:37   DG Chest Port 1 View  Result Date: 01/30/2021 CLINICAL DATA:  Acute on chronic hypoxemic respiratory failure. EXAM: PORTABLE CHEST 1 VIEW COMPARISON:  January 28, 2021 FINDINGS: No pneumothorax. Stable cardiomediastinal silhouette and right Port-A-Cath. Left-greater-than-right pulmonary opacities/infiltrates. No other interval changes. IMPRESSION: 1. Persistent low lung volumes and bilateral pulmonary infiltrates. No interval changes. Electronically Signed   By: Dorise Bullion III M.D.   On: 01/30/2021 08:36   DG Chest Port 1 View  Result Date: 01/28/2021 CLINICAL DATA:  Shortness of breath. EXAM: PORTABLE CHEST 1 VIEW COMPARISON:  Multiple recent chest films. FINDINGS: The right IJ power port is stable. Persistent low lung volumes and bilateral pulmonary infiltrates. No definite pleural effusion or pneumothorax. IMPRESSION: Persistent low lung volumes and bilateral pulmonary infiltrates. Electronically Signed   By: Marijo Sanes M.D.   On: 01/28/2021 16:03   DG Chest Port 1 View  Result Date: 01/26/2021 CLINICAL DATA:  Shortness of breath, abnormal chest x-ray EXAM: PORTABLE CHEST 1 VIEW COMPARISON:  01/24/2021 FINDINGS: Right Port-A-Cath remains in place, unchanged. Low lung volumes with bilateral airspace opacities, most pronounced in the left perihilar and lower lobe, unchanged since prior study. No visible effusions or pneumothorax. Heart is normal size. IMPRESSION: Low lung volumes with bilateral airspace disease, left-greater-than-right. No significant change. Electronically Signed   By: Rolm Baptise M.D.   On: 01/26/2021 09:42   DG Chest Port 1 View  Result Date: 01/24/2021 CLINICAL DATA:  Follow-up multifocal pneumonia. EXAM:  PORTABLE CHEST 1 VIEW COMPARISON:  01/22/2021 and older exams. FINDINGS: Mild interval decrease in the airspace lung opacities, most evident on the left. Lung volumes remain low. No new lung  abnormalities. No visualized pleural effusion.  No pneumothorax. Right internal jugular Port-A-Cath is stable. IMPRESSION: 1. Mild interval improvement in lung aeration with decreased airspace lung opacities, most notably on the left. Electronically Signed   By: Lajean Manes M.D.   On: 01/24/2021 07:57   DG Chest Port 1 View  Result Date: 01/22/2021 CLINICAL DATA:  Hypoxia, shortness of breath. EXAM: PORTABLE CHEST 1 VIEW COMPARISON:  January 20, 2021. FINDINGS: Stable cardiomediastinal silhouette. Right internal jugular Port-A-Cath is unchanged in position. Hypoinflation of the lungs is noted. Stable bilateral lung opacities are noted, left greater than right, most consistent with multifocal pneumonia. Bony thorax is unremarkable. IMPRESSION: Hypoinflation of the lungs. Stable bilateral lung opacities are noted, left greater than right, most consistent with multifocal pneumonia. Electronically Signed   By: Marijo Conception M.D.   On: 01/22/2021 19:26   DG Chest Port 1 View  Result Date: 01/20/2021 CLINICAL DATA:  Pneumonitis EXAM: PORTABLE CHEST 1 VIEW COMPARISON:  01/19/2021 FINDINGS: Shallow inspiration. Patchy infiltrates in the left lung and right lung base, similar to prior study and likely multifocal pneumonia. No pleural effusions. No pneumothorax. Mediastinal contours appear intact. Heart size is normal. Power port type central venous catheter on the right with tip over the cavoatrial junction region. Surgical clips in the right upper quadrant. IMPRESSION: Shallow inspiration. Patchy infiltrates in both lungs, likely multifocal pneumonia, without change. Electronically Signed   By: Lucienne Capers M.D.   On: 01/20/2021 03:56   DG Chest Port 1 View  Result Date: 01/19/2021 CLINICAL DATA:  Abnormality on  previous chest x-ray in a 60 year old male. EXAM: PORTABLE CHEST 1 VIEW COMPARISON:  CT from January 13, 2021, chest x-ray January 18, 2021. FINDINGS: RIGHT-sided Port-A-Cath terminates at the caval to atrial junction. Trachea is midline. Cardiomediastinal contours are stable. Interstitial and airspace opacities throughout the chest worse in the LEFT mid to upper chest, RIGHT upper chest lung bases bilaterally with similar appearance. On limited assessment there is no acute skeletal process. EKG leads project over the chest. IMPRESSION: No interval change in the appearance of the chest since previous imaging with continued decreased lung volumes and patchy bilateral opacities. Electronically Signed   By: Zetta Bills M.D.   On: 01/19/2021 07:59   DG Chest Port 1 View  Result Date: 01/18/2021 CLINICAL DATA:  Respiratory distress EXAM: PORTABLE CHEST 1 VIEW COMPARISON:  01/17/2021 FINDINGS: Cardiac shadow is stable. Right-sided chest wall port is again seen. Lungs are hypoinflated. Patchy airspace opacities again identified in both lungs left greater than right but stable from the prior exam. IMPRESSION: Patchy airspace opacities bilaterally stable from the prior exam. Electronically Signed   By: Inez Catalina M.D.   On: 01/18/2021 17:24   DG Chest Port 1 View  Result Date: 01/17/2021 CLINICAL DATA:  Worsening shortness of breath. EXAM: PORTABLE CHEST 1 VIEW COMPARISON:  01/14/2021 FINDINGS: 0826 hours. Low volume film. Patchy airspace disease again noted in both lungs, left greater than right. Cardiopericardial silhouette is at upper limits of normal for size. Right Port-A-Cath again noted. Telemetry leads overlie the chest. IMPRESSION: Low volume film with patchy bilateral airspace disease, left greater than right. No substantial change. Electronically Signed   By: Misty Stanley M.D.   On: 01/17/2021 08:53   DG Chest Port 1 View  Result Date: 01/14/2021 CLINICAL DATA:  Shortness of breath.  History  of cancer.  Diabetes. EXAM: PORTABLE CHEST 1 VIEW.  Patient is rotated. COMPARISON:  Chest x-ray 01/13/2021, CT chest  01/13/2021 FINDINGS: Accessed right chest wall Port-A-Cath in stable position. The heart and mediastinal contours are within normal limits. Low lung volumes with persistent diffuse patchy airspace opacities. No pleural effusion. No pneumothorax. No acute osseous abnormality. Right upper quadrant surgical clips. IMPRESSION: Low lung volumes with persistent diffuse patchy airspace opacities. Electronically Signed   By: Iven Finn M.D.   On: 01/14/2021 21:08   DG Chest Port 1 View  Result Date: 01/13/2021 CLINICAL DATA:  Shortness of breath EXAM: PORTABLE CHEST 1 VIEW COMPARISON:  Chest radiograph 07/29/2019 FINDINGS: There is a right chest wall port with the tip terminating in the lower SVC/cavoatrial junction. The cardiomediastinal silhouette is within normal limits. Lung volumes are low with unchanged asymmetric elevation of the right hemidiaphragm. There are increased interstitial markings with diffuse reticular opacities throughout both lungs. There is no focal consolidation. There is no significant pleural effusion. There is no pneumothorax. There is no acute osseous abnormality. There is gaseous distention of the stomach and large bowel in the right upper quadrant. IMPRESSION: Low lung volumes with suspected mild pulmonary interstitial edema. Atypical/viral infection could have a similar appearance. Electronically Signed   By: Valetta Mole M.D.   On: 01/13/2021 14:24    Assessment/Plan Edward Atkinson is a 60 year old male with medical history significant for chronic hypoxic respiratory failure on 2 L continuous nasal cannula in the setting of interstitial lung disease, type 2 diabetes mellitus, pulmonary embolism in 2020 now chronically anticoagulated on Eliquis, who is admitted to [facility] on 01/13/2021 with acute on chronic hypoxic respiratory failure after  presenting from home to [facility] ED complaining of shortness of breath.   1.  Possible removal of Port-A-Cath: Patient currently has a right-sided Port-A-Cath.  Currently has been accessed while he has been inpatient.  Placed for follicular lymphoma. The patient is s/p HRCT with bilateral pulmonary fibrosis and bronchiectasis.  Patient will be starting the Afflo VEST therapy.  Reviewed with oncology - OK to remove chest port to allow patient to use vest therapy.  We will plan on removal of the patient's Port-A-Cath tomorrow with Dr. Delana Meyer.  Procedure, risks and benefits were explained to the patient.  All questions were answered.  The patient wishes to proceed.  2.  Follicular lymphoma: Currently has a right Port-A-Cath   3.  Acute on chronic toxic respiratory failure in the setting of interstitial lung disease exacerbation: Able to wean down to about 10 L. Followed by pulmonary  Discussed with Dr. Francene Castle, PA-C  02/08/2021 2:00 PM  This note was created with Dragon medical transcription system.  Any error is purely unintentional.

## 2021-02-08 NOTE — Progress Notes (Signed)
PROGRESS NOTE    Edward Atkinson  GQQ:[id number] DOB: April 27, 1961 DOA: 01/13/2021 PCP: Rusty Aus, MD   Chief complaint.  Shortness of breath. Brief Narrative:  60 year old man with history of follicular lymphoma, [id number] infection, interstitial lung disease and chronic respiratory failure, and type 2 diabetes mellitus.  Patient was admitted 20 days ago with acute hypoxic respiratory failure and interstitial lung disease exacerbation.  Patient had a CT scan of the chest that did not show any pulmonary embolism.  The patient was on heated high flow nasal cannula during the course and then was tapered over to bubble high flow nasal cannula.  Patient condition appears to be fluctuating, currently, he is back on 45% of heated high flow again today.  Has completed a course of antibiotics.  He is still on steroids.  He has been followed by pulmonology   Assessment & Plan:   Principal Problem:   Acute on chronic respiratory failure with hypoxia ([place]) Active Problems:   Type 2 diabetes mellitus with hypoglycemia without coma, without long-term current use of insulin (HCC)   Follicular lymphoma (HCC)   SOB (shortness of breath)   Interstitial lung disease (HCC)   Severe sepsis (HCC)   CAP (community acquired pneumonia)   Elevated troponin   Protein calorie malnutrition (HCC)   Protein-calorie malnutrition, severe   AF (paroxysmal atrial fibrillation) (HCC)   Thrombocytosis   Leukocytosis   Pressure injury of skin   Hyponatremia   Uncontrolled type 2 diabetes mellitus with hyperglycemia ([place])    Acute on chronic hypoxemic respiratory failure. Interstitial lung disease exacerbation Condition seems to improving again.  Oxygen was able to be weaned down to 10 L.  Patient still has significant cough, mucus production seem to be better. Continue steroids, CellCept, Bactrim and acyclovir. Continue DuoNeb.   Uncontrolled type 2 diabetes with hyperglycemia. Worsening glucose due to  steroids.  Levemir was increased dose yesterday.  Continue to follow.     Severe protein calorie malnutrition. Continue protein supplement.  Paroxysmal atrial fibrillation. Continue Eliquis.  Follicular lymphoma.         DVT prophylaxis: Eliquis Code Status: full Family Communication:  Disposition Plan:      Status is: Inpatient   Remains inpatient appropriate because:Inpatient level of care appropriate due to severity of illness, patient has severe acute respite failure on  high flow oxygen.   Dispo: The patient is from: Home              Anticipated d/c is to: Home              Patient currently is not medically stable to d/c.              Difficult to place patient No     Consultants:  Pulmonology   Procedures: None   Antimicrobials: None        I/O last 3 completed shifts: In: 1080 [P.O.:1080] Out: 0998 [Urine:3835] Total I/O In: 480 [P.O.:480] Out: 625 [Urine:625]        Subjective: Patient feels better today, he was able to move around more today, did not have additional desaturation.  Respiratory therapist was able to wean down oxygen to 10 L. He still has a cough, seem to be less productive now. No fever chills per No dysuria hematuria.  Objective: Vitals:   02/08/21 0738 02/08/21 0748 02/08/21 1003 02/08/21 1133  BP: 130/83   129/79  Pulse: 80 81  96  Resp: 18 16  19   Temp:  98 F (36.7 C)   98.1 F (36.7 C)  TempSrc:      SpO2: 99% 98% 96% 94%  Weight:      Height:        Intake/Output Summary (Last 24 hours) at 02/08/2021 1233 Last data filed at 02/08/2021 1039 Gross per 24 hour  Intake 960 ml  Output 2025 ml  Net -1065 ml   Filed Weights   02/04/21 0500 02/06/21 0500 02/07/21 0430  Weight: 68.6 kg 67 kg 66.3 kg    Examination:  General exam: Appears calm and comfortable  Respiratory system: Crackles in the base. Respiratory effort normal. Cardiovascular system: S1 & S2 heard, RRR. No JVD, murmurs, rubs, gallops or  clicks. No pedal edema. Gastrointestinal system: Abdomen is nondistended, soft and nontender. No organomegaly or masses felt. Normal bowel sounds heard. Central nervous system: Alert and oriented. No focal neurological deficits. Extremities: Significant muscle atrophy. Skin: No rashes, lesions or ulcers Psychiatry: Judgement and insight appear normal. Mood & affect appropriate.     Data Reviewed: I have personally reviewed following labs and imaging studies  CBC: Recent Labs  Lab 02/03/21 0530  WBC 11.4*  HGB 10.6*  HCT 30.8*  MCV 83.0  PLT 510   Basic Metabolic Panel: Recent Labs  Lab 02/04/21 0500  NA 134*  K 3.9  CL 95*  CO2 34*  GLUCOSE 146*  BUN 17  CREATININE 0.50*  CALCIUM 7.8*  MG 1.9   GFR: Estimated Creatinine Clearance: 89.8 mL/min (A) (by C-G formula based on SCr of 0.5 mg/dL (L)). Liver Function Tests: No results for input(s): AST, ALT, ALKPHOS, BILITOT, PROT, ALBUMIN in the last 168 hours. No results for input(s): LIPASE, AMYLASE in the last 168 hours. No results for input(s): AMMONIA in the last 168 hours. Coagulation Profile: No results for input(s): INR, PROTIME in the last 168 hours. Cardiac Enzymes: No results for input(s): CKTOTAL, CKMB, CKMBINDEX, TROPONINI in the last 168 hours. BNP (last 3 results) No results for input(s): PROBNP in the last 8760 hours. HbA1C: No results for input(s): HGBA1C in the last 72 hours. CBG: Recent Labs  Lab 02/07/21 1216 02/07/21 1622 02/07/21 2123 02/08/21 0736 02/08/21 1134  GLUCAP 233* 110* 283* 293* 325*   Lipid Profile: No results for input(s): CHOL, HDL, LDLCALC, TRIG, CHOLHDL, LDLDIRECT in the last 72 hours. Thyroid Function Tests: No results for input(s): TSH, T4TOTAL, FREET4, T3FREE, THYROIDAB in the last 72 hours. Anemia Panel: No results for input(s): VITAMINB12, FOLATE, FERRITIN, TIBC, IRON, RETICCTPCT in the last 72 hours. Sepsis Labs: No results for input(s): PROCALCITON, LATICACIDVEN in  the last 168 hours.  No results found for this or any previous visit (from the past 240 hour(s)).       Radiology Studies: CT Chest High Resolution  Result Date: 02/07/2021 CLINICAL DATA:  Inpatient. Interstitial lung disease. Worsening dyspnea. Chronic hypoxemia post COVID-19. History of lymphoma treated with Rituxan. EXAM: CT CHEST WITHOUT CONTRAST TECHNIQUE: Multidetector CT imaging of the chest was performed following the standard protocol without intravenous contrast. High resolution imaging of the lungs, as well as inspiratory and expiratory imaging, was performed. COMPARISON:  02/05/2021 chest radiograph. 01/13/2021 chest CT angiogram. FINDINGS: Cardiovascular: Normal heart size. No significant pericardial effusion/thickening. Right internal jugular central venous catheter terminates at the cavoatrial junction. Normal course and caliber of the thoracic aorta. Stable dilated main pulmonary artery (3.7 cm diameter). Mediastinum/Nodes: No discrete thyroid nodules. Unremarkable esophagus. No pathologically enlarged axillary, mediastinal or hilar lymph nodes, noting limited sensitivity for  the detection of hilar adenopathy on this noncontrast study. Lungs/Pleura: No pneumothorax. No pleural effusion. Severe patchy confluent peribronchovascular and peripheral ground-glass opacity, reticulation, traction bronchiectasis, architectural distortion and volume loss in both lungs with a basilar predominance. The ground-glass opacities have mildly improved since 01/13/2021 chest CT. The bronchiectasis, distortion and volume loss appear slightly worsened. No significant lobular air trapping or evidence of tracheobronchomalacia on the expiration sequence. No acute consolidative airspace disease, lung masses or significant pulmonary nodules. No frank honeycombing. Upper abdomen: Chronic mild elevation of the right hemidiaphragm. Musculoskeletal: No aggressive appearing focal osseous lesions. Mild thoracic  spondylosis. IMPRESSION: 1. Spectrum of findings compatible with severe basilar predominant fibrotic interstitial lung disease with ground-glass and no frank honeycombing predominance. Ground-glass opacities have slightly improved and bronchiectasis and other findings of fibrosis have slightly worsened since recent 01/13/2021 chest CT angiogram study. Findings are most compatible with a severe rapidly progressive postinflammatory fibrosis or fibrotic NSIP pattern. Findings are suggestive of an alternative diagnosis (not UIP) per consensus guidelines: Diagnosis of Idiopathic Pulmonary Fibrosis: An Official ATS/ERS/JRS/ALAT Clinical Practice Guideline. [place], Iss 5, [id number], Dec 31 2016. 2. Stable dilated main pulmonary artery, suggesting chronic pulmonary arterial hypertension. 3. Chronic mild elevation of the right hemidiaphragm. Electronically Signed   By: Ilona Sorrel M.D.   On: 02/07/2021 17:29        Scheduled Meds:  ALPRAZolam  0.25 mg Oral q morning   apixaban  5 mg Oral BID   Chlorhexidine Gluconate Cloth  6 each Topical Daily   digoxin  0.125 mg Oral Daily   diltiazem  120 mg Oral Daily   docusate sodium  200 mg Oral BID   feeding supplement (NEPRO CARB STEADY)  237 mL Oral TID BM   furosemide  20 mg Intravenous Daily   guaiFENesin  1,200 mg Oral BID   insulin aspart  0-20 Units Subcutaneous TID WC   insulin aspart  0-5 Units Subcutaneous QHS   insulin aspart  10 Units Subcutaneous TID WC   insulin glargine-yfgn  9 Units Subcutaneous BID   ipratropium-albuterol  3 mL Nebulization Q6H   methylPREDNISolone (SOLU-MEDROL) injection  40 mg Intravenous Q12H   multivitamin with minerals  1 tablet Oral Daily   mycophenolate  750 mg Oral BID   polyethylene glycol  17 g Oral Daily   sodium chloride flush  10-40 mL Intracatheter Q12H   sulfamethoxazole-trimethoprim  1 tablet Oral Q12H   Continuous Infusions:   LOS: 26 days    Time spent: 28  minutes    Sharen Hones, MD [facility]   To contact the attending provider between 7A-7P or the covering provider during after hours 7P-7A, please log into the web site [email or web address] and access using universal [place] password for that web site. If you do not have the password, please call the hospital operator.  02/08/2021, 12:33 PM

## 2021-02-08 NOTE — TOC Progression Note (Signed)
Transition of Care [facility]) - Progression Note    Patient Details  Name: Edward Atkinson MRN: [id number] Date of Birth: August 30, 1960  Transition of [facility] CM/SW [place], [place] Phone Number: 02/08/2021, 2:20 PM  Clinical Narrative:     CSW attempted to make LTAC referral, according to [facility] after review patient does not meet criteria.   CSW notes patient continues to wean off of O2, pending medical stability to dc home.   Expected Discharge Plan: Home/Self Care Barriers to Discharge: Continued Medical Work up  Expected Discharge Plan and Services Expected Discharge Plan: Home/Self Care     Post Acute Care Choice: NA Living arrangements for the past 2 months: Single Family Home                                       Social Determinants of Health (SDOH) Interventions    Readmission Risk Interventions Readmission Risk Prevention Plan 01/17/2021  Transportation Screening Complete  PCP or Specialist Appt within 3-5 Days Complete  Social Work Consult for [place] Planning/Counseling [place] Not Applicable  Medication Review [employer] photographer) Complete  Some recent data might be hidden

## 2021-02-09 ENCOUNTER — Encounter
Admission: EM | Disposition: A | Discharge status: Home Health | Payer: Self-pay | Source: Home / Self Care | Attending: Internal Medicine

## 2021-02-09 DIAGNOSIS — Z452 Encounter for adjustment and management of vascular access device: Secondary | ICD-10-CM | POA: Diagnosis not present

## 2021-02-09 DIAGNOSIS — J9621 Acute and chronic respiratory failure with hypoxia: Secondary | ICD-10-CM | POA: Diagnosis not present

## 2021-02-09 DIAGNOSIS — J849 Interstitial pulmonary disease, unspecified: Secondary | ICD-10-CM | POA: Diagnosis not present

## 2021-02-09 HISTORY — PX: PORTA CATH REMOVAL: CATH118286

## 2021-02-09 LAB — BASIC METABOLIC PANEL
Anion gap: 9 (ref 5–15)
BUN: 20 mg/dL (ref 8–23)
CO2: 32 mmol/L (ref 22–32)
Calcium: 9.1 mg/dL (ref 8.9–10.3)
Chloride: 94 mmol/L — ABNORMAL LOW (ref 98–111)
Creatinine, Ser: 0.76 mg/dL (ref 0.61–1.24)
GFR, Estimated: >60 mL/min (ref >60)
Glucose, Bld: 277 mg/dL — ABNORMAL HIGH (ref 70–99)
Potassium: 4.5 mmol/L (ref 3.5–5.1)
Sodium: 135 mmol/L (ref 135–145)

## 2021-02-09 LAB — GLUCOSE, CAPILLARY
Glucose-Capillary: 275 mg/dL — ABNORMAL HIGH (ref 70–99)
Glucose-Capillary: 200 mg/dL — ABNORMAL HIGH (ref 70–99)
Glucose-Capillary: 123 mg/dL — ABNORMAL HIGH (ref 70–99)
Glucose-Capillary: 134 mg/dL — ABNORMAL HIGH (ref 70–99)
Glucose-Capillary: 342 mg/dL — ABNORMAL HIGH (ref 70–99)

## 2021-02-09 LAB — SEDIMENTATION RATE: Sed Rate: 56 mm/hr — ABNORMAL HIGH (ref 0–20)

## 2021-02-09 LAB — C-REACTIVE PROTEIN: CRP: 0.8 mg/dL (ref <1.0)

## 2021-02-09 SURGERY — PORTA CATH REMOVAL
Anesthesia: Moderate Sedation | Laterality: Right

## 2021-02-09 MED ORDER — ONDANSETRON HCL 4 MG/2ML IJ SOLN: 4.0000 mg | Freq: Four times a day (QID) | INTRAMUSCULAR | Status: DC | PRN

## 2021-02-09 MED ORDER — SODIUM CHLORIDE 0.9 % IV SOLN: INTRAVENOUS | Status: DC

## 2021-02-09 MED ORDER — MIDAZOLAM HCL 2 MG/ML PO SYRP: 8.0000 mg | ORAL_SOLUTION | Freq: Once | ORAL | Status: DC | PRN

## 2021-02-09 MED ORDER — METHYLPREDNISOLONE SODIUM SUCC 125 MG IJ SOLR: 125.0000 mg | Freq: Once | INTRAMUSCULAR | Status: DC | PRN

## 2021-02-09 MED ORDER — MIDAZOLAM HCL 2 MG/2ML IJ SOLN
INTRAMUSCULAR | Status: DC | PRN
  Administered 2021-02-09: 2 mg via INTRAVENOUS

## 2021-02-09 MED ORDER — FAMOTIDINE 20 MG PO TABS: 40.0000 mg | ORAL_TABLET | Freq: Once | ORAL | Status: DC | PRN

## 2021-02-09 MED ORDER — MIDAZOLAM HCL 5 MG/5ML IJ SOLN
INTRAMUSCULAR | Status: AC
  Filled 2021-02-09: qty 5

## 2021-02-09 MED ORDER — INSULIN GLARGINE-YFGN 100 UNIT/ML ~~LOC~~ SOLN
14.0000 [IU] | Freq: Two times a day (BID) | SUBCUTANEOUS | Status: DC
  Administered 2021-02-09 – 2021-02-11 (×4): 14 [IU] via SUBCUTANEOUS
  Filled 2021-02-09 (×6): qty 0.14

## 2021-02-09 MED ORDER — DIPHENHYDRAMINE HCL 50 MG/ML IJ SOLN: 50.0000 mg | Freq: Once | INTRAMUSCULAR | Status: DC | PRN

## 2021-02-09 MED ORDER — HYDROMORPHONE HCL 1 MG/ML IJ SOLN: 1.0000 mg | Freq: Once | INTRAMUSCULAR | Status: DC | PRN

## 2021-02-09 MED ORDER — INSULIN ASPART 100 UNIT/ML IJ SOLN
0.0000 [IU] | Freq: Three times a day (TID) | INTRAMUSCULAR | Status: DC
  Administered 2021-02-09: 3 [IU] via SUBCUTANEOUS
  Administered 2021-02-09: 2 [IU] via SUBCUTANEOUS
  Administered 2021-02-10: 15 [IU] via SUBCUTANEOUS
  Administered 2021-02-10: 11 [IU] via SUBCUTANEOUS
  Administered 2021-02-11: 8 [IU] via SUBCUTANEOUS
  Administered 2021-02-11: 3 [IU] via SUBCUTANEOUS
  Administered 2021-02-11 – 2021-02-12 (×2): 5 [IU] via SUBCUTANEOUS
  Administered 2021-02-12: 8 [IU] via SUBCUTANEOUS
  Administered 2021-02-12: 3 [IU] via SUBCUTANEOUS
  Administered 2021-02-13: 5 [IU] via SUBCUTANEOUS
  Administered 2021-02-13: 11 [IU] via SUBCUTANEOUS
  Administered 2021-02-13: 5 [IU] via SUBCUTANEOUS
  Administered 2021-02-14: 11 [IU] via SUBCUTANEOUS
  Administered 2021-02-14 (×2): 5 [IU] via SUBCUTANEOUS
  Administered 2021-02-15 (×2): 8 [IU] via SUBCUTANEOUS
  Administered 2021-02-15 – 2021-02-16 (×2): 3 [IU] via SUBCUTANEOUS
  Administered 2021-02-16: 15 [IU] via SUBCUTANEOUS
  Administered 2021-02-16: 3 [IU] via SUBCUTANEOUS
  Administered 2021-02-17 (×2): 2 [IU] via SUBCUTANEOUS
  Administered 2021-02-18: 8 [IU] via SUBCUTANEOUS
  Administered 2021-02-18: 2 [IU] via SUBCUTANEOUS
  Administered 2021-02-19: 3 [IU] via SUBCUTANEOUS
  Filled 2021-02-09 (×25): qty 1

## 2021-02-09 MED ORDER — FENTANYL CITRATE (PF) 100 MCG/2ML IJ SOLN
INTRAMUSCULAR | Status: DC | PRN
  Administered 2021-02-09: 50 ug via INTRAVENOUS

## 2021-02-09 MED ORDER — INSULIN ASPART 100 UNIT/ML IJ SOLN
0.0000 [IU] | Freq: Every day | INTRAMUSCULAR | Status: DC
  Administered 2021-02-09: 4 [IU] via SUBCUTANEOUS
  Administered 2021-02-11 – 2021-02-13 (×2): 3 [IU] via SUBCUTANEOUS
  Administered 2021-02-17: 2 [IU] via SUBCUTANEOUS
  Filled 2021-02-09 (×4): qty 1

## 2021-02-09 MED ORDER — FENTANYL CITRATE PF 50 MCG/ML IJ SOSY
PREFILLED_SYRINGE | INTRAMUSCULAR | Status: AC
  Filled 2021-02-09: qty 1

## 2021-02-09 SURGICAL SUPPLY — 7 items
ADH SKN CLS APL DERMABOND .7 (GAUZE/BANDAGES/DRESSINGS) ×1
DERMABOND ADVANCED (GAUZE/BANDAGES/DRESSINGS) ×1
DERMABOND ADVANCED .7 DNX12 (GAUZE/BANDAGES/DRESSINGS) IMPLANT
PACK ANGIOGRAPHY (CUSTOM PROCEDURE TRAY) ×1 IMPLANT
SUT MNCRL AB 4-0 PS2 18 (SUTURE) ×1 IMPLANT
SUT VIC AB 3-0 SH 27 (SUTURE) ×2
SUT VIC AB 3-0 SH 27X BRD (SUTURE) IMPLANT

## 2021-02-09 NOTE — Op Note (Signed)
  OPERATIVE NOTE   PROCEDURE: Removal of Infuse-a-Port  PRE-OPERATIVE DIAGNOSIS: Complication of Infuse-a-Port with need for Afflo Vest  POST-OPERATIVE DIAGNOSIS: Same  SURGEON: Hortencia Pilar, M.D.  ANESTHESIA: Conscious sedation was administered under my direct supervision by the interventional radiology RN. IV Versed plus fentanyl were utilized. Continuous ECG, pulse oximetry and blood pressure was monitored throughout the entire procedure.  Conscious sedation was for a total of 28 minutes 54 seconds.   ESTIMATED BLOOD LOSS: Minimal   SPECIMEN(S):  Infuse-a-port intact  INDICATIONS:   Edward Atkinson is a 60 y.o. y.o. male who presents with worsening of his pulmonary status it is now felt that he needs vest therapy in the port is inhibiting moving forward.  But it been recommended by pulmonary that we remove the port to allow for this therapy and the patient has agreed.  Therefore we have made arrangements for port removal. The risks and benefits of been reviewed all questions answered patient agrees to proceed with port removal   DESCRIPTION: After obtaining full informed written consent, the patient is brought to special procedures and positioned supine.  The patient received IV antibiotics.  The patient was prepped and draped in the standard fashion appropriate time out is called.    After infiltrating 1% lidocaine with epinephrine into the soft tissues and skin surrounding the port the previous incisional scar is reopened with an 11 blade scalpel.  The port is slipped from the pocket and subsequently removed without difficulty otherwise intact.  Pressure is held at the base of the neck for 5 minutes, a pocket is irrigated. Subtotally the wound is packed with saline moistened gauze and sterile dressings applied.  The patient tolerated the procedure without changes  COMPLICATIONS: None  CONDITION: Good  Hortencia Pilar, M.D. [facility] Office:  [phone]   02/09/2021, 5:02 PM

## 2021-02-09 NOTE — Progress Notes (Signed)
Pulmonary Medicine          Date: 02/09/2021,   MRN# [id number] Edward Atkinson 02-Apr-1961     AdmissionWeight: 63.5 kg                 CurrentWeight: 67.9 kg   Referring physician: Dr Roderic Palau   CHIEF COMPLAINT:   Acute on chronic hypoxemic respiratory failure   HISTORY OF PRESENT ILLNESS   60 yo M w/hx of chronic hypoxemia and ILD post COVID19, T2DM, PE on eliquis, Lymphoma s/p Rituxan which is completed.  Came in due to worsening SOB and DOE specifically mild exertional defacation/urination with feelings of presyncope and severe dyspnea which started to get worse post tapering from steroid appx few days prior to onset of symptom progression.  He was placed on solumedrol and reported mild improvement.  He is on 10L /min [place] during my evaluation.  He had CT chest done with PE protocol and noted to have bilateral GGO infiltrates worse compared to June study. PCCM consultation for further evaluation and management.     01/17/21-  patient seems to be slighly clinically worse post reduction of IV steroids yesterday indicative of ongoing ILD exacerbation, thus far infectious workup is negative but inflammatory biomarkers are highly elevated also pointing towards ILD exacerbation.  There is new CXR in process this morning for interval changes. We discussed increasing steroids slightly and giving more time.  I will also initiate steroid sparing immunomodulation with BID cellcept as patient will likely need therapy for prolonged time period.  Compensatory tachycardia noted appreciate cardiology team.  Sugars with peaks and valleys with adjusted steroid therapy.  Patient with muscle wasting and protein calorie malnutrition but eating better this am.  01/18/21- patient is on HFNC at 60%, feels better, has not been able to tolerate BIPAP.  Wife is at bedside.  BP is borderline low MAP 76.  He has negative infectious workup and thus far ILD exacerbation seems to be main differential. He  continues to have peaks and valleys in blood glucose.  We started steroid sparing regimen with cellcept bid 250 mg and will increase dose as we taper off prednisone.  Remains tachyarrtythmic but improved. Appreciate everyone involved.     01/19/21- patient seen and examined at bedside.  Wife present during evaluation.  Ow HFNC weaned to 41%/45L/MIN.  CRP is still up, have increased cellcept and reducing solumedrol.  Patient felt stronger physically and was able to get OOB to chair. Cough is less now.  He is non-labored with breathing no accessory mm use today. He had dark stools with reduced hb, FOBT ordred.  Haptoglobin is elevated. Pathology slide review ordered.   01/20/21- patient is essentially unchanged from yesterday from pulmonary perspective. CXR also unchanged,  bloodwork with reduction in CRP.  Plan to wean O2 and continue OOB with PT/OT,  Continue current steroid dose and Cellcept.   01/21/21- patient is stable on HFNC.  He had small set back with increased O2 req after exerting himself in bathroom.  HFNC from 41/45 to 54/45.   He diuresed very well with lasix 20 bid >2L urine in past 24h.  Tachycardia is improved. He does not feel weaker and infact states he feels slowly improved.   01/22/21- I spoke to patient regarding goals of care and recommend DNR.  He is agreeable but I asked him to discuss with wife and family out of respect and we will revisit tommorow.  He states he is feeling better  and CRP is trending down but he remains on HFNC 60/40.  He was able to get OOB today and walked to bathroom but experienced mild desaturation with quick recovery "not much issues".      01/23/21- patient is still on high flow nasal canula , he is on 56%/45L.  We reviewed code status and he is still in discussion with family regarding DNR.   We discussed lung transplant per family request and this may be a future option if he is strong enough and recovers.  I have increased insluin TID to 20 from 15 and  increased bactrim to bid SS   01/24/21- patient weaned on HFNC to 49/45, he was able to use BIPAP overnight thinks its helping.  CXR this AM with interval improvement. Will keep on current dose of meds.  Repeat CRP in am.  Last BM yesterday well formed no blood, eating good, urine output very good on 20 bid lasix. Getting OOB with  transient desaturation.  Overall slight improvement.    01/25/21- patient has weaned down to 35%/40L/min.  He remains full code. Reviewed medical plan with Dr Jimmye Norman today.  Reduced steroids from 50 BID to 40 BID and also reduced insulin slightly.    01/26/21- patient is further weaned to 15L humidified HF nasal canula.  He did PT ROM exercises with mild desaturation. CXR today with expiratory image unable to see much interval changes. Patient reports improvement clinically. Plan to continue current regimen. Sugars slightly high due to steroids, will wean steroids soon. Continue cellcept as current 500 bid  01/27/21- patient is relatively same as yesterday, his O2 is weaned down to 14L.  He remains on IV solumedrol at 50 bid, we did discuss weaning this further to 40 BID. Will keep cellcept at current dose 500 bid.  His ankles have been down in seated position and are edematous on examination. He continues to participate with PT and feels good subjectively. We discussed starting dose of lasix for pedal edema.    01/28/21- patient examined at bedside, s/p PT/OT session with slight dyspnea.  He was weaned to 10L O2 but then went back up to 13 post PT due to increased WOB.  He has complete resolution of LE edema and appears to be euvolemic at this point. We discussed reducing soluemdrol today to 20 bid.  I reviewed plan with Dr Leslye Peer today.  01/29/21- patient examined at bedside.  He had a good day and feels better. We reviewed medical plan and are reducing steroids to PO prednisone starting at 1m daily.   01/30/21-  Patient seen and examined at bedside.  CXR this am with  worsening pulmonary edema, included below.  Vitals stable with mild tachypnea and tachycardia with activity. Will deliver 1 dose IV lasix 20 today. Plan to continue Pred PO and cellcept at 500 bid. Pulmonary intensive PT with IS and will initiate Metaneb therapy with albuterol. Blood work today will be repeated CRP CMP phos procal ESR.  Patient is agreeable to BIPAP QHS and Metaneb BID.   01/31/21-  patient had set back yesterday with mild flash pulm edema on CXR. He diuresed very well overnight and feels back to where he was at previously.  He had transient reactive leukocytosis as well.  We initiated empirically zosyn and zithromax on top of bid bactrim.  This probably could be dcd since he recovered so quickly which would not occur with pneumonia.  Ill go head and dc this today but continue PO bactrim.  Will stay on pred 50 today with cellcept at 500 bid as previous.  His O2 has been reduced from 95 to 70% overnight and hes working with PT today. Hopefully we can wean this back down to 10L as he was previosly then back to his home setting.   02/01/21- patient is somewhat better today, he is being weaned on O2 and is now down to 40%. He had BM today. Leaving prednisone at current dose and cxr today.   02/02/21- patient is now on 15L HF bubbler.  He reports feeling better.  He had 3 bowel movements after stool softner. He is working with PT/OT.  His long term prognosis is poor unfortunately and we appreciate collaboration with Palliative care team.   02/03/21- patient had over exerted himself again and required increased O2. He states he feels better and just did more then he should have with PT.  His wbc count is trending down. His CRP is trending down.  I have started morhpine for air hunger and pain at sacrum for skin breakdown and pressure ulceration.     02/04/21- patient is unchanged.  He wants to keep fighting to improve.  CRP is trending down again.  ESR is upward trending. He was unable to perform  adequately with PT and had severe dyspnea and desaturation just to get OOB to chair. Discussed case with [employer] to see if he can have HFNC at home but the device has been recalled. Discussed patient with Dr Roosevelt Locks plan is for additional diuresis.  I have increased cellcept to 750 BID today  02/05/21- patient with UOP >2L yesterday, his spO2 req remains HFNC 35/40.  He feels well and wants to keep working to be discharged to rehab. We have ordered NIV for him.  Patient has pulmonary fibrosis with restrictive lung disease and would benefit from device such as Trilogy 100/200 with O2 bleed in.  His overall prognosis is poor.   02/06/21- patient is back up to 50%/45L.  He clinically feels well but just not able to get off the oxygen or improve as well as we had hoped.  I had lengthy discussion with patient he wishes to continue current inpatient therapy and wants to be more aggressive medically.  I have increased steroids to solumedrol 40 bid again.    02/07/21- patient is unchanged, he states he feels well.  Sitting up in chair speaking infull sentences but still rquiring HFNC at 50%/45L.  Today will perform HRCT to evaluate details of pulmonary parenchyma.   02/08/21- patient s/p HRCT with bilateral pulmonary fibrosis and bronchiectasis.  We discussed [employer] VEST therapy.  Reviewed with oncology will plan to remove chest port to allow patient to use vest therapy.  He is working with PT to wean O2.   02/09/21- patient weaned to 9L/min.  For port removal today.   PAST MEDICAL HISTORY   Past Medical History:  Diagnosis Date  . Chronic respiratory failure with hypoxia (HCC)    2L [place] continuous  . COVID-19   . Diabetes mellitus without complication ([place])   . Diverticulitis   . Follicular lymphoma of intra-abdominal lymph nodes ([place]) 02/15/2019  . ILD (interstitial lung disease) ([place])      SURGICAL HISTORY   Past Surgical History:  Procedure Laterality Date  . CHOLECYSTECTOMY    . COLON RESECTION      DUE TO DIVERTICULITIS  . FLEXIBLE BRONCHOSCOPY N/A 12/02/2019   Procedure: FLEXIBLE BRONCHOSCOPY;  Surgeon: Ottie Glazier, MD;  Location: [facility];  Service: Thoracic;  Laterality: N/A;  . PORTA CATH INSERTION N/A 03/11/2019   Procedure: PORTA CATH INSERTION;  Surgeon: Algernon Huxley, MD;  Location: [place] [facility];  Service: Cardiovascular;  Laterality: N/A;  . PULMONARY THROMBECTOMY N/A 01/29/2019   Procedure: PULMONARY THROMBECTOMY;  Surgeon: Algernon Huxley, MD;  Location: [place];  Service: Cardiovascular;  Laterality: N/A;     FAMILY HISTORY   Family History  Problem Relation Age of Onset  . Diabetes Brother      SOCIAL HISTORY   Social History   Tobacco Use  . Smoking status: Never  . Smokeless tobacco: Never  Vaping Use  . Vaping Use: Never used  Substance Use Topics  . Alcohol use: Yes    Alcohol/week: 0.0 - 2.0 standard drinks    Comment: occasional  . Drug use: Never     MEDICATIONS    Home Medication:    Current Medication:  Current Facility-Administered Medications:  .  0.9 %  sodium chloride infusion, , Intravenous, Continuous, Sharen Hones, MD, Last Rate: 75 mL/hr at 02/08/21 2233, New Bag at 02/08/21 2233 .  acetaminophen (TYLENOL) tablet 650 mg, 650 mg, Oral, Q6H PRN **OR** acetaminophen (TYLENOL) suppository 650 mg, 650 mg, Rectal, Q6H PRN, Oswald Hillock, RPH .  ALPRAZolam (XANAX) tablet 0.25 mg, 0.25 mg, Oral, QHS PRN, Sharen Hones, MD .  ALPRAZolam Duanne Moron) tablet 0.25 mg, 0.25 mg, Oral, q morning, Sharen Hones, MD, 0.25 mg at 02/09/21 0826 .  apixaban (ELIQUIS) tablet 5 mg, 5 mg, Oral, BID, Beers, Shanon Brow, RPH, 5 mg at 02/09/21 0826 .  Chlorhexidine Gluconate Cloth 2 % PADS 6 each, 6 each, Topical, Daily, Kathie Dike, MD, 6 each at 02/09/21 0828 .  digoxin (LANOXIN) tablet 0.125 mg, 0.125 mg, Oral, Daily, Memon, Jehanzeb, MD, 0.125 mg at 02/09/21 0826 .  diltiazem (CARDIZEM CD) 24 hr capsule 120 mg, 120 mg, Oral, Daily,  Chason Mciver, MD, 120 mg at 02/09/21 0826 .  diltiazem (CARDIZEM) injection 10 mg, 10 mg, Intravenous, Q6H PRN, Wyvonnia Dusky, MD, 10 mg at 01/24/21 2328 .  docusate sodium (COLACE) capsule 200 mg, 200 mg, Oral, BID, Wyvonnia Dusky, MD, 200 mg at 02/09/21 0825 .  feeding supplement (NEPRO CARB STEADY) liquid 237 mL, 237 mL, Oral, TID BM, Memon, Jehanzeb, MD, Last Rate: 0 mL/hr at 01/21/21 2130, 237 mL at 02/08/21 1400 .  furosemide (LASIX) injection 20 mg, 20 mg, Intravenous, Daily, Ottie Glazier, MD, 20 mg at 02/09/21 0828 .  guaiFENesin (MUCINEX) 12 hr tablet 1,200 mg, 1,200 mg, Oral, BID, Kathie Dike, MD, 1,200 mg at 02/09/21 0827 .  guaiFENesin-dextromethorphan (ROBITUSSIN DM) 100-10 MG/5ML syrup 5 mL, 5 mL, Oral, Q4H PRN, Kathie Dike, MD, 5 mL at 02/04/21 0850 .  insulin aspart (novoLOG) injection 0-15 Units, 0-15 Units, Subcutaneous, TID WC, Sharen Hones, MD, 3 Units at 02/09/21 1158 .  insulin aspart (novoLOG) injection 0-5 Units, 0-5 Units, Subcutaneous, QHS, Zhang, Dekui, MD .  insulin aspart (novoLOG) injection 10 Units, 10 Units, Subcutaneous, TID WC, Loletha Grayer, MD, 10 Units at 02/08/21 1215 .  insulin glargine-yfgn (SEMGLEE) injection 14 Units, 14 Units, Subcutaneous, BID, Roosevelt Locks, Dekui, MD .  ipratropium-albuterol (DUONEB) 0.5-2.5 (3) MG/3ML nebulizer solution 3 mL, 3 mL, Nebulization, Q6H, Sharen Hones, MD, 3 mL at 02/09/21 0709 .  lactulose (CHRONULAC) 10 GM/15ML solution 30 g, 30 g, Oral, BID PRN, Wieting, Richard, MD .  levalbuterol (XOPENEX) nebulizer solution 1.25 mg, 1.25 mg, Nebulization, Q4H PRN, Howerter,  Justin B, DO, 1.25 mg at 01/17/21 2011 .  methylPREDNISolone sodium succinate (SOLU-MEDROL) 40 mg/mL injection 40 mg, 40 mg, Intravenous, Q12H, Lanney Gins, Mindee Robledo, MD, 40 mg at 02/09/21 1158 .  morphine 2 MG/ML injection 1 mg, 1 mg, Intravenous, Q3H PRN, Ottie Glazier, MD, 1 mg at 02/06/21 1412 .  multivitamin with minerals tablet 1 tablet, 1  tablet, Oral, Daily, Kathie Dike, MD, 1 tablet at 02/08/21 1206 .  mycophenolate (CELLCEPT) capsule 750 mg, 750 mg, Oral, BID, Lanney Gins, Illyanna Petillo, MD, 750 mg at 02/09/21 0825 .  polyethylene glycol (MIRALAX / GLYCOLAX) packet 17 g, 17 g, Oral, Daily, Leslye Peer, Richard, MD, 17 g at 02/08/21 0802 .  simethicone (MYLICON) chewable tablet 80 mg, 80 mg, Oral, QID PRN, Loletha Grayer, MD, 80 mg at 01/29/21 1411 .  sodium chloride (OCEAN) 0.65 % nasal spray 1 spray, 1 spray, Each Nare, PRN, Loletha Grayer, MD, 1 spray at 01/30/21 2142 .  sodium chloride flush (NS) 0.9 % injection 10-40 mL, 10-40 mL, Intracatheter, Q12H, Memon, Jehanzeb, MD, 10 mL at 02/08/21 1000 .  sodium chloride flush (NS) 0.9 % injection 10-40 mL, 10-40 mL, Intracatheter, PRN, Kathie Dike, MD, 10 mL at 01/24/21 0915 .  sulfamethoxazole-trimethoprim (BACTRIM) 400-80 MG per tablet 1 tablet, 1 tablet, Oral, Q12H, Ottie Glazier, MD, 1 tablet at 02/09/21 0827 .  traZODone (DESYREL) tablet 50 mg, 50 mg, Oral, QHS PRN, Sharion Settler, NP, 50 mg at 02/08/21 2225    ALLERGIES   Penicillins     REVIEW OF SYSTEMS    Review of Systems:  Gen:  Denies  fever, sweats, chills weigh loss  HEENT: Denies blurred vision, double vision, ear pain, eye pain, hearing loss, nose bleeds, sore throat Cardiac:  No dizziness, chest pain or heaviness, chest tightness,edema Resp:  admits to severe dyspnea Gi: Denies swallowing difficulty, stomach pain, nausea or vomiting, diarrhea, constipation, bowel incontinence Gu:  Denies bladder incontinence, burning urine Ext:   Denies Joint pain, stiffness or swelling Skin: Denies  skin rash, easy bruising or bleeding or hives Endoc:  Denies polyuria, polydipsia , polyphagia or weight change Psych:   admits to axniety  Other:  All other systems negative   VS: BP 114/76 (BP Location: Right Arm)   Pulse 98   Temp 98 F (36.7 C)   Resp 20   Ht 6' (1.829 m)   Wt 67.9 kg   SpO2 95%   BMI  20.30 kg/m      PHYSICAL EXAM    GENERAL:NAD, no fevers, chills, no weakness no fatigue HEAD: Normocephalic, atraumatic.  EYES: Pupils equal, round, reactive to light. Extraocular muscles intact. No scleral icterus.  MOUTH: Moist mucosal membrane. Dentition intact. No abscess noted.  EAR, NOSE, THROAT: Clear without exudates. No external lesions.  NECK: Supple. No thyromegaly. No nodules. No JVD.  PULMONARY: Bilateral rhonchi improved from previous CARDIOVASCULAR: S1 and S2. Regular rate and rhythm. No murmurs, rubs, or gallops. No edema. Pedal pulses 2+ bilaterally.  GASTROINTESTINAL: Soft, nontender, nondistended. No masses. Positive bowel sounds. No hepatosplenomegaly.  MUSCULOSKELETAL: No swelling, clubbing, or edema. Range of motion full in all extremities.  NEUROLOGIC: Cranial nerves II through XII are intact. No gross focal neurological deficits. Sensation intact. Reflexes intact.  SKIN: No ulceration, lesions, rashes, or cyanosis. Skin warm and dry. Turgor intact.  PSYCHIATRIC: Mood, affect within normal limits. The patient is awake, alert and oriented x 3. Insight, judgment intact.       IMAGING    CT Angio Chest PE W/Cm &/  Or Wo Cm  Result Date: 01/13/2021 CLINICAL DATA:  Respiratory distress, hypoxia EXAM: CT ANGIOGRAPHY CHEST WITH CONTRAST TECHNIQUE: Multidetector CT imaging of the chest was performed using the standard protocol during bolus administration of intravenous contrast. Multiplanar CT image reconstructions and MIPs were obtained to evaluate the vascular anatomy. CONTRAST:  64m OMNIPAQUE IOHEXOL 350 MG/ML SOLN COMPARISON:  10/26/2020, 01/13/2021 FINDINGS: Cardiovascular: This is a technically adequate evaluation of the pulmonary vasculature. No filling defects or pulmonary emboli. The heart is unremarkable without pericardial effusion. No evidence of thoracic aortic aneurysm or dissection. Right chest wall port via internal jugular approach tip within the superior  vena cava. Mediastinum/Nodes: No pathologic adenopathy within the mediastinum, hila, or axilla. Thyroid, trachea, and esophagus are unremarkable. Lungs/Pleura: There has been interval progression of the multifocal ground-glass airspace disease seen previously. Changes are most pronounced within the dependent lower lobes. No effusion or pneumothorax. The central airways are patent, with persistent bronchiectasis. Upper Abdomen: No acute abnormality. Musculoskeletal: No acute or destructive bony lesions. Reconstructed images demonstrate no additional findings. Review of the MIP images confirms the above findings. IMPRESSION: 1. No evidence of pulmonary embolus. 2. Progressive multifocal bilateral ground-glass airspace disease, greatest at the lung bases. The differential diagnosis would include progressive postinflammatory scarring and fibrosis, multifocal atypical pneumonia, or hypersensitivity/drug toxicity. 3. Stable bronchiectasis. Electronically Signed   By: MRanda NgoM.D.   On: 01/13/2021 17:13   CT Chest High Resolution  Result Date: 02/07/2021 CLINICAL DATA:  Inpatient. Interstitial lung disease. Worsening dyspnea. Chronic hypoxemia post COVID-19. History of lymphoma treated with Rituxan. EXAM: CT CHEST WITHOUT CONTRAST TECHNIQUE: Multidetector CT imaging of the chest was performed following the standard protocol without intravenous contrast. High resolution imaging of the lungs, as well as inspiratory and expiratory imaging, was performed. COMPARISON:  02/05/2021 chest radiograph. 01/13/2021 chest CT angiogram. FINDINGS: Cardiovascular: Normal heart size. No significant pericardial effusion/thickening. Right internal jugular central venous catheter terminates at the cavoatrial junction. Normal course and caliber of the thoracic aorta. Stable dilated main pulmonary artery (3.7 cm diameter). Mediastinum/Nodes: No discrete thyroid nodules. Unremarkable esophagus. No pathologically enlarged axillary,  mediastinal or hilar lymph nodes, noting limited sensitivity for the detection of hilar adenopathy on this noncontrast study. Lungs/Pleura: No pneumothorax. No pleural effusion. Severe patchy confluent peribronchovascular and peripheral ground-glass opacity, reticulation, traction bronchiectasis, architectural distortion and volume loss in both lungs with a basilar predominance. The ground-glass opacities have mildly improved since 01/13/2021 chest CT. The bronchiectasis, distortion and volume loss appear slightly worsened. No significant lobular air trapping or evidence of tracheobronchomalacia on the expiration sequence. No acute consolidative airspace disease, lung masses or significant pulmonary nodules. No frank honeycombing. Upper abdomen: Chronic mild elevation of the right hemidiaphragm. Musculoskeletal: No aggressive appearing focal osseous lesions. Mild thoracic spondylosis. IMPRESSION: 1. Spectrum of findings compatible with severe basilar predominant fibrotic interstitial lung disease with ground-glass and no frank honeycombing predominance. Ground-glass opacities have slightly improved and bronchiectasis and other findings of fibrosis have slightly worsened since recent 01/13/2021 chest CT angiogram study. Findings are most compatible with a severe rapidly progressive postinflammatory fibrosis or fibrotic NSIP pattern. Findings are suggestive of an alternative diagnosis (not UIP) per consensus guidelines: Diagnosis of Idiopathic Pulmonary Fibrosis: An Official ATS/ERS/JRS/ALAT Clinical Practice Guideline. A[place] Iss 5, [id number] Dec 31 2016. 2. Stable dilated main pulmonary artery, suggesting chronic pulmonary arterial hypertension. 3. Chronic mild elevation of the right hemidiaphragm. Electronically Signed   By: JJanina MayoD.  On: 02/07/2021 17:29   DG Chest Port 1 View  Result Date: 02/05/2021 CLINICAL DATA:  Bilateral pulmonary infiltrates on chest x-ray. EXAM:  PORTABLE CHEST 1 VIEW COMPARISON:  Chest radiograph 02/01/2021 FINDINGS: Power injectable right IJ port central venous catheter tip projects at the level of the superior cavoatrial junction. Unchanged low lung volumes with lower lobe predominant interstitial opacities, left greater than right. No new focal consolidation, pleural effusion, or pneumothorax. Heart is normal in size. Surgical clips in the right upper abdomen. IMPRESSION: Bilateral interstitial airspace opacities are unchanged compared to 02/01/2021. No new focal consolidation, pleural effusion, or pneumothorax. Electronically Signed   By: Ileana Roup M.D.   On: 02/05/2021 10:59   DG Chest Port 1 View  Result Date: 02/01/2021 CLINICAL DATA:  Shortness of breath, edema EXAM: PORTABLE CHEST 1 VIEW COMPARISON:  01/30/2021 FINDINGS: Stable positioning of right IJ approach Port-A-Cath. Heart size within normal limits. Low lung volumes. Slight interval worsening of bilateral airspace opacities, left worse than right. No pleural effusion or pneumothorax. IMPRESSION: Slight interval worsening of bilateral airspace opacities, left worse than right. Electronically Signed   By: Davina Poke D.O.   On: 02/01/2021 14:37   DG Chest Port 1 View  Result Date: 01/30/2021 CLINICAL DATA:  Acute on chronic hypoxemic respiratory failure. EXAM: PORTABLE CHEST 1 VIEW COMPARISON:  January 28, 2021 FINDINGS: No pneumothorax. Stable cardiomediastinal silhouette and right Port-A-Cath. Left-greater-than-right pulmonary opacities/infiltrates. No other interval changes. IMPRESSION: 1. Persistent low lung volumes and bilateral pulmonary infiltrates. No interval changes. Electronically Signed   By: Dorise Bullion III M.D.   On: 01/30/2021 08:36   DG Chest Port 1 View  Result Date: 01/28/2021 CLINICAL DATA:  Shortness of breath. EXAM: PORTABLE CHEST 1 VIEW COMPARISON:  Multiple recent chest films. FINDINGS: The right IJ power port is stable. Persistent low lung  volumes and bilateral pulmonary infiltrates. No definite pleural effusion or pneumothorax. IMPRESSION: Persistent low lung volumes and bilateral pulmonary infiltrates. Electronically Signed   By: Marijo Sanes M.D.   On: 01/28/2021 16:03   DG Chest Port 1 View  Result Date: 01/26/2021 CLINICAL DATA:  Shortness of breath, abnormal chest x-ray EXAM: PORTABLE CHEST 1 VIEW COMPARISON:  01/24/2021 FINDINGS: Right Port-A-Cath remains in place, unchanged. Low lung volumes with bilateral airspace opacities, most pronounced in the left perihilar and lower lobe, unchanged since prior study. No visible effusions or pneumothorax. Heart is normal size. IMPRESSION: Low lung volumes with bilateral airspace disease, left-greater-than-right. No significant change. Electronically Signed   By: Rolm Baptise M.D.   On: 01/26/2021 09:42   DG Chest Port 1 View  Result Date: 01/24/2021 CLINICAL DATA:  Follow-up multifocal pneumonia. EXAM: PORTABLE CHEST 1 VIEW COMPARISON:  01/22/2021 and older exams. FINDINGS: Mild interval decrease in the airspace lung opacities, most evident on the left. Lung volumes remain low. No new lung abnormalities. No visualized pleural effusion.  No pneumothorax. Right internal jugular Port-A-Cath is stable. IMPRESSION: 1. Mild interval improvement in lung aeration with decreased airspace lung opacities, most notably on the left. Electronically Signed   By: Lajean Manes M.D.   On: 01/24/2021 07:57   DG Chest Port 1 View  Result Date: 01/22/2021 CLINICAL DATA:  Hypoxia, shortness of breath. EXAM: PORTABLE CHEST 1 VIEW COMPARISON:  January 20, 2021. FINDINGS: Stable cardiomediastinal silhouette. Right internal jugular Port-A-Cath is unchanged in position. Hypoinflation of the lungs is noted. Stable bilateral lung opacities are noted, left greater than right, most consistent with multifocal pneumonia. Bony thorax is  unremarkable. IMPRESSION: Hypoinflation of the lungs. Stable bilateral lung opacities  are noted, left greater than right, most consistent with multifocal pneumonia. Electronically Signed   By: Marijo Conception M.D.   On: 01/22/2021 19:26   DG Chest Port 1 View  Result Date: 01/20/2021 CLINICAL DATA:  Pneumonitis EXAM: PORTABLE CHEST 1 VIEW COMPARISON:  01/19/2021 FINDINGS: Shallow inspiration. Patchy infiltrates in the left lung and right lung base, similar to prior study and likely multifocal pneumonia. No pleural effusions. No pneumothorax. Mediastinal contours appear intact. Heart size is normal. Power port type central venous catheter on the right with tip over the cavoatrial junction region. Surgical clips in the right upper quadrant. IMPRESSION: Shallow inspiration. Patchy infiltrates in both lungs, likely multifocal pneumonia, without change. Electronically Signed   By: Lucienne Capers M.D.   On: 01/20/2021 03:56   DG Chest Port 1 View  Result Date: 01/19/2021 CLINICAL DATA:  Abnormality on previous chest x-ray in a 60 year old male. EXAM: PORTABLE CHEST 1 VIEW COMPARISON:  CT from January 13, 2021, chest x-ray January 18, 2021. FINDINGS: RIGHT-sided Port-A-Cath terminates at the caval to atrial junction. Trachea is midline. Cardiomediastinal contours are stable. Interstitial and airspace opacities throughout the chest worse in the LEFT mid to upper chest, RIGHT upper chest lung bases bilaterally with similar appearance. On limited assessment there is no acute skeletal process. EKG leads project over the chest. IMPRESSION: No interval change in the appearance of the chest since previous imaging with continued decreased lung volumes and patchy bilateral opacities. Electronically Signed   By: Zetta Bills M.D.   On: 01/19/2021 07:59   DG Chest Port 1 View  Result Date: 01/18/2021 CLINICAL DATA:  Respiratory distress EXAM: PORTABLE CHEST 1 VIEW COMPARISON:  01/17/2021 FINDINGS: Cardiac shadow is stable. Right-sided chest wall port is again seen. Lungs are hypoinflated. Patchy  airspace opacities again identified in both lungs left greater than right but stable from the prior exam. IMPRESSION: Patchy airspace opacities bilaterally stable from the prior exam. Electronically Signed   By: Inez Catalina M.D.   On: 01/18/2021 17:24   DG Chest Port 1 View  Result Date: 01/17/2021 CLINICAL DATA:  Worsening shortness of breath. EXAM: PORTABLE CHEST 1 VIEW COMPARISON:  01/14/2021 FINDINGS: 0826 hours. Low volume film. Patchy airspace disease again noted in both lungs, left greater than right. Cardiopericardial silhouette is at upper limits of normal for size. Right Port-A-Cath again noted. Telemetry leads overlie the chest. IMPRESSION: Low volume film with patchy bilateral airspace disease, left greater than right. No substantial change. Electronically Signed   By: Misty Stanley M.D.   On: 01/17/2021 08:53   DG Chest Port 1 View  Result Date: 01/14/2021 CLINICAL DATA:  Shortness of breath.  History of cancer.  Diabetes. EXAM: PORTABLE CHEST 1 VIEW.  Patient is rotated. COMPARISON:  Chest x-ray 01/13/2021, CT chest 01/13/2021 FINDINGS: Accessed right chest wall Port-A-Cath in stable position. The heart and mediastinal contours are within normal limits. Low lung volumes with persistent diffuse patchy airspace opacities. No pleural effusion. No pneumothorax. No acute osseous abnormality. Right upper quadrant surgical clips. IMPRESSION: Low lung volumes with persistent diffuse patchy airspace opacities. Electronically Signed   By: Iven Finn M.D.   On: 01/14/2021 21:08   DG Chest Port 1 View  Result Date: 01/13/2021 CLINICAL DATA:  Shortness of breath EXAM: PORTABLE CHEST 1 VIEW COMPARISON:  Chest radiograph 07/29/2019 FINDINGS: There is a right chest wall port with the tip terminating in the lower SVC/cavoatrial junction.  The cardiomediastinal silhouette is within normal limits. Lung volumes are low with unchanged asymmetric elevation of the right hemidiaphragm. There are increased  interstitial markings with diffuse reticular opacities throughout both lungs. There is no focal consolidation. There is no significant pleural effusion. There is no pneumothorax. There is no acute osseous abnormality. There is gaseous distention of the stomach and large bowel in the right upper quadrant. IMPRESSION: Low lung volumes with suspected mild pulmonary interstitial edema. Atypical/viral infection could have a similar appearance. Electronically Signed   By: Valetta Mole M.D.   On: 01/13/2021 14:24           ASSESSMENT/PLAN   Acute hypoxemic respiratory failure Due to acute exacerbation of interstitial lung disease with pulmonary fibrosis - present on admission  - COVID19 negative   - supplemental O2 during my evaluation 10l/min>>8L>>HFNC  -Respiratory viral panel-negative  -serum fungitell-negative  -legionella ab-negative  -nasal MRSA PCR-negative -Procalcitonin trend reviewed -strep pneumoniae ur AG -Histoplasma Ur Ag-negative -sputum resp cultures -AFB sputum expectorated specimen -sputum cytology  -completed  Cefepime course for cap -Reduced Solumedrol to 40 BID - 01/15/21>>50 BID>>40 BID>>30 BID>>20 BID>>pred 50>>soluemdrol 40 iv bid please watch surgars -reviewed pertinent imaging with patient today - ESR and CRP are elevated but improving -PT/OT for d/c planning  -please encourage patient to use incentive spirometer few times each hour while hospitalized.   -continue Cellcept 250 bid >>500 bid >>750BID -UOP is adequate -Discussed goal of care - recommend DNR patient is in agreement but still discussing with wife, palliative care service to follow  -lasix 20 daily PO     Thank you for allowing me to participate in the care of this patient.   Patient/Family are satisfied with care plan and all questions have been answered.   This document was prepared using Dragon voice recognition software and may include unintentional dictation errors.     Ottie Glazier,  M.D.  Division of [facility]

## 2021-02-09 NOTE — Progress Notes (Signed)
PT Cancellation Note  Patient Details Name: Edward Atkinson MRN: [id number] DOB: [birth year]   Cancelled Treatment:    Reason Eval/Treat Not Completed: Other (comment).  Pt sitting up in recliner upon PT arrival; pt declining PT at this time d/t wanting to rest prior to getting his port removed today (pt reports doing leg ex's already this morning).  Will re-attempt PT treatment session at a later date/time.  Leitha Bleak, PT 02/09/21, 12:06 PM

## 2021-02-09 NOTE — Interval H&P Note (Signed)
History and Physical Interval Note:  02/09/2021 3:01 PM  [name]  has presented today for surgery, with the diagnosis of Lymphoma.  The various methods of treatment have been discussed with the patient and family. After consideration of risks, benefits and other options for treatment, the patient has consented to  Procedure(s): PORTA CATH REMOVAL (Right) as a surgical intervention.  The patient's history has been reviewed, patient examined, no change in status, stable for surgery.  I have reviewed the patient's chart and labs.  Questions were answered to the patient's satisfaction.     Edward Atkinson

## 2021-02-09 NOTE — Progress Notes (Signed)
PROGRESS NOTE    Edward Atkinson  ZOX:[id number] DOB: 1960-07-30 DOA: 01/13/2021 PCP: Rusty Aus, MD   Chief complaint.  Shortness of breath. Brief Narrative:  60 year old man with history of follicular lymphoma, [id number] infection, interstitial lung disease and chronic respiratory failure, and type 2 diabetes mellitus.  Patient was admitted 20 days ago with acute hypoxic respiratory failure and interstitial lung disease exacerbation.  Patient had a CT scan of the chest that did not show any pulmonary embolism.  The patient was on heated high flow nasal cannula during the course and then was tapered over to bubble high flow nasal cannula.  Patient condition appears to be fluctuating, currently, he is back on 45% of heated high flow again today.  Has completed a course of antibiotics.  He is still on steroids.  He has been followed by pulmonology.  Is also on CellCept, prophylactic acyclovir and Bactrim.   Assessment & Plan:   Principal Problem:   Acute on chronic respiratory failure with hypoxia (HCC) Active Problems:   Type 2 diabetes mellitus with hypoglycemia without coma, without long-term current use of insulin (HCC)   Follicular lymphoma (HCC)   SOB (shortness of breath)   Interstitial lung disease (HCC)   Severe sepsis (HCC)   CAP (community acquired pneumonia)   Elevated troponin   Protein calorie malnutrition (HCC)   Protein-calorie malnutrition, severe   AF (paroxysmal atrial fibrillation) (HCC)   Thrombocytosis   Leukocytosis   Pressure injury of skin   Hyponatremia   Uncontrolled type 2 diabetes mellitus with hyperglycemia (HCC)  Acute on chronic hypoxemic respiratory failure. Interstitial lung disease exacerbation Patient condition finally improving, he is down to 8 L oxygen.  He was able to move to the bedside commode, did not have a significant drop of oxygen saturation. Is still on IV Solu-Medrol, may continue for few days. He also had increased mucus  production, currently treated with DuoNeb.  Sputum culture is sent out, pending final results. Continue Solu-Medrol, CellCept, Bactrim and acyclovir. Continue incentive spirometer.  Uncontrolled type 2 diabetes with hyperglycemia. Worsening glucose after giving IV Solu-Medrol. Currently on long-acting, scheduled short acting and sliding scale insulin.  Sliding scale insulin has been changed from resistance to moderate dose.  Levemir dose increased.  Severe protein calorie malnutrition. Continue protein supplement.  Paroxysmal atrial fibrillation. Continue Eliquis.  Follicular lymphoma. Patient is a followed by oncology, oncology has scheduled for removal of IV port today.  Patient condition finally improving, he was chronically on 2 L oxygen, may consider discharge home with oxygen use is around 2 to 3 L  DVT prophylaxis: Eliquis Code Status: full Family Communication: Wife updated at the bedside. Disposition Plan:      Status is: Inpatient   Remains inpatient appropriate because:Inpatient level of care appropriate due to severity of illness, patient has severe acute respite failure on  high flow oxygen.   Dispo: The patient is from: Home              Anticipated d/c is to: Home              Patient currently is not medically stable to d/c.              Difficult to place patient No         I/O last 3 completed shifts: In: 1440 [P.O.:1440] Out: 76 [Urine:4100] Total I/O In: -  Out: 500 [Urine:500]    Consultants:  Pulmonology   Procedures: None   Antimicrobials:  None    Subjective: Patient feels much better today, oxygen use down to 8 L.  Patient was able to move to the bedside commode without significant drop of oxygen saturation. He still has a cough, still has significant clear mucus.  Occasionally with very thick mucus, better after giving breathing treatment. No fever or chills No dysuria hematuria P No abdominal pain nausea vomiting, he has been  having regular bowel movements.  Objective: Vitals:   02/09/21 0433 02/09/21 0613 02/09/21 0709 02/09/21 0718  BP: 118/76   121/63  Pulse: 76   78  Resp: 18   19  Temp: (!) 97.5 F (36.4 C)   97.8 F (36.6 C)  TempSrc: Oral     SpO2: 100%  99% 100%  Weight:  67.9 kg    Height:        Intake/Output Summary (Last 24 hours) at 02/09/2021 1048 Last data filed at 02/09/2021 1018 Gross per 24 hour  Intake 960 ml  Output 2875 ml  Net -1915 ml   Filed Weights   02/06/21 0500 02/07/21 0430 02/09/21 0613  Weight: 67 kg 66.3 kg 67.9 kg    Examination:  General exam: Appears calm and comfortable, severely malnourished Respiratory system: Crackles in the base. Respiratory effort normal. Cardiovascular system: S1 & S2 heard, RRR. No JVD, murmurs, rubs, gallops or clicks. No pedal edema. Gastrointestinal system: Abdomen is nondistended, soft and nontender. No organomegaly or masses felt. Normal bowel sounds heard. Central nervous system: Alert and oriented. No focal neurological deficits. Extremities: Significant muscle atrophy Skin: No rashes, lesions or ulcers Psychiatry: Judgement and insight appear normal. Mood & affect appropriate.     Data Reviewed: I have personally reviewed following labs and imaging studies  CBC: Recent Labs  Lab 02/03/21 0530  WBC 11.4*  HGB 10.6*  HCT 30.8*  MCV 83.0  PLT 782   Basic Metabolic Panel: Recent Labs  Lab 02/04/21 0500 02/09/21 0700  NA 134* 135  K 3.9 4.5  CL 95* 94*  CO2 34* 32  GLUCOSE 146* 277*  BUN 17 20  CREATININE 0.50* 0.76  CALCIUM 7.8* 9.1  MG 1.9  --    GFR: Estimated Creatinine Clearance: 91.9 mL/min (by C-G formula based on SCr of 0.76 mg/dL). Liver Function Tests: No results for input(s): AST, ALT, ALKPHOS, BILITOT, PROT, ALBUMIN in the last 168 hours. No results for input(s): LIPASE, AMYLASE in the last 168 hours. No results for input(s): AMMONIA in the last 168 hours. Coagulation Profile: No results  for input(s): INR, PROTIME in the last 168 hours. Cardiac Enzymes: No results for input(s): CKTOTAL, CKMB, CKMBINDEX, TROPONINI in the last 168 hours. BNP (last 3 results) No results for input(s): PROBNP in the last 8760 hours. HbA1C: No results for input(s): HGBA1C in the last 72 hours. CBG: Recent Labs  Lab 02/08/21 0736 02/08/21 1134 02/08/21 1611 02/08/21 2056 02/09/21 0723  GLUCAP 293* 325* 72 352* 275*   Lipid Profile: No results for input(s): CHOL, HDL, LDLCALC, TRIG, CHOLHDL, LDLDIRECT in the last 72 hours. Thyroid Function Tests: No results for input(s): TSH, T4TOTAL, FREET4, T3FREE, THYROIDAB in the last 72 hours. Anemia Panel: No results for input(s): VITAMINB12, FOLATE, FERRITIN, TIBC, IRON, RETICCTPCT in the last 72 hours. Sepsis Labs: No results for input(s): PROCALCITON, LATICACIDVEN in the last 168 hours.  Recent Results (from the past 240 hour(s))  Culture, BAL-quantitative w Gram Stain     Status: None (Preliminary result)   Collection Time: 02/08/21  7:50 AM  Specimen: SPU; Respiratory  Result Value Ref Range Status   Specimen Description   Final    SPUTUM Performed at [facility], [address] [place], [place] [address]    Special Requests   Final    NONE Performed at [facility], [address] [place], [place] [address]    Gram Stain   Final    NO SQUAMOUS EPITHELIAL CELLS SEEN NO WBC SEEN FEW GRAM POSITIVE RODS FEW GRAM POSITIVE COCCI    Culture   Final    CULTURE REINCUBATED FOR BETTER GROWTH Performed at [facility], [place] [address], [place], [place] [address]    Report Status PENDING  Incomplete         Radiology Studies: CT Chest High Resolution  Result Date: 02/07/2021 CLINICAL DATA:  Inpatient. Interstitial lung disease. Worsening dyspnea. Chronic hypoxemia post COVID-19. History of lymphoma treated with Rituxan. EXAM: CT CHEST WITHOUT CONTRAST TECHNIQUE: Multidetector CT imaging of the chest was  performed following the standard protocol without intravenous contrast. High resolution imaging of the lungs, as well as inspiratory and expiratory imaging, was performed. COMPARISON:  02/05/2021 chest radiograph. 01/13/2021 chest CT angiogram. FINDINGS: Cardiovascular: Normal heart size. No significant pericardial effusion/thickening. Right internal jugular central venous catheter terminates at the cavoatrial junction. Normal course and caliber of the thoracic aorta. Stable dilated main pulmonary artery (3.7 cm diameter). Mediastinum/Nodes: No discrete thyroid nodules. Unremarkable esophagus. No pathologically enlarged axillary, mediastinal or hilar lymph nodes, noting limited sensitivity for the detection of hilar adenopathy on this noncontrast study. Lungs/Pleura: No pneumothorax. No pleural effusion. Severe patchy confluent peribronchovascular and peripheral ground-glass opacity, reticulation, traction bronchiectasis, architectural distortion and volume loss in both lungs with a basilar predominance. The ground-glass opacities have mildly improved since 01/13/2021 chest CT. The bronchiectasis, distortion and volume loss appear slightly worsened. No significant lobular air trapping or evidence of tracheobronchomalacia on the expiration sequence. No acute consolidative airspace disease, lung masses or significant pulmonary nodules. No frank honeycombing. Upper abdomen: Chronic mild elevation of the right hemidiaphragm. Musculoskeletal: No aggressive appearing focal osseous lesions. Mild thoracic spondylosis. IMPRESSION: 1. Spectrum of findings compatible with severe basilar predominant fibrotic interstitial lung disease with ground-glass and no frank honeycombing predominance. Ground-glass opacities have slightly improved and bronchiectasis and other findings of fibrosis have slightly worsened since recent 01/13/2021 chest CT angiogram study. Findings are most compatible with a severe rapidly progressive  postinflammatory fibrosis or fibrotic NSIP pattern. Findings are suggestive of an alternative diagnosis (not UIP) per consensus guidelines: Diagnosis of Idiopathic Pulmonary Fibrosis: An Official ATS/ERS/JRS/ALAT Clinical Practice Guideline. Lozano, Iss 5, [phone], Dec 31 2016. 2. Stable dilated main pulmonary artery, suggesting chronic pulmonary arterial hypertension. 3. Chronic mild elevation of the right hemidiaphragm. Electronically Signed   By: Ilona Sorrel M.D.   On: 02/07/2021 17:29        Scheduled Meds:  ALPRAZolam  0.25 mg Oral q morning   apixaban  5 mg Oral BID   Chlorhexidine Gluconate Cloth  6 each Topical Daily   digoxin  0.125 mg Oral Daily   diltiazem  120 mg Oral Daily   docusate sodium  200 mg Oral BID   feeding supplement (NEPRO CARB STEADY)  237 mL Oral TID BM   furosemide  20 mg Intravenous Daily   guaiFENesin  1,200 mg Oral BID   insulin aspart  0-15 Units Subcutaneous TID WC   insulin aspart  0-5 Units Subcutaneous QHS  insulin aspart  10 Units Subcutaneous TID WC   insulin glargine-yfgn  14 Units Subcutaneous BID   ipratropium-albuterol  3 mL Nebulization Q6H   methylPREDNISolone (SOLU-MEDROL) injection  40 mg Intravenous Q12H   multivitamin with minerals  1 tablet Oral Daily   mycophenolate  750 mg Oral BID   polyethylene glycol  17 g Oral Daily   sodium chloride flush  10-40 mL Intracatheter Q12H   sulfamethoxazole-trimethoprim  1 tablet Oral Q12H   Continuous Infusions:  sodium chloride 75 mL/hr at 02/08/21 2233     LOS: 27 days    Time spent: 32 minutes, more than 50% time spent on direct patient care    Sharen Hones, MD [facility]   To contact the attending provider between 7A-7P or the covering provider during after hours 7P-7A, please log into the web site [email or web address] and access using universal [place] password for that web site. If you do not have the password, please call the hospital  operator.  02/09/2021, 10:48 AM

## 2021-02-10 ENCOUNTER — Encounter: Payer: Self-pay | Admitting: Internal Medicine

## 2021-02-10 DIAGNOSIS — J849 Interstitial pulmonary disease, unspecified: Secondary | ICD-10-CM | POA: Diagnosis not present

## 2021-02-10 DIAGNOSIS — J9621 Acute and chronic respiratory failure with hypoxia: Secondary | ICD-10-CM | POA: Diagnosis not present

## 2021-02-10 LAB — CBC
HCT: 29.7 % — ABNORMAL LOW (ref 39.0–52.0)
Hemoglobin: 10.1 g/dL — ABNORMAL LOW (ref 13.0–17.0)
MCH: 29.4 pg (ref 26.0–34.0)
MCHC: 34 g/dL (ref 30.0–36.0)
MCV: 86.6 fL (ref 80.0–100.0)
Platelets: 186 10*3/uL (ref 150–400)
RBC: 3.43 MIL/uL — ABNORMAL LOW (ref 4.22–5.81)
RDW: 20.2 % — ABNORMAL HIGH (ref 11.5–15.5)
WBC: 7.1 10*3/uL (ref 4.0–10.5)
nRBC: 0 % (ref 0.0–0.2)

## 2021-02-10 LAB — BASIC METABOLIC PANEL
Anion gap: 8 (ref 5–15)
BUN: 21 mg/dL (ref 8–23)
CO2: 32 mmol/L (ref 22–32)
Calcium: 8.7 mg/dL — ABNORMAL LOW (ref 8.9–10.3)
Chloride: 93 mmol/L — ABNORMAL LOW (ref 98–111)
Creatinine, Ser: 0.63 mg/dL (ref 0.61–1.24)
GFR, Estimated: >60 mL/min (ref >60)
Glucose, Bld: 384 mg/dL — ABNORMAL HIGH (ref 70–99)
Potassium: 4.5 mmol/L (ref 3.5–5.1)
Sodium: 133 mmol/L — ABNORMAL LOW (ref 135–145)

## 2021-02-10 LAB — CULTURE, BAL-QUANTITATIVE W GRAM STAIN
Culture: NORMAL
Gram Stain: NONE SEEN

## 2021-02-10 LAB — GLUCOSE, CAPILLARY
Glucose-Capillary: 334 mg/dL — ABNORMAL HIGH (ref 70–99)
Glucose-Capillary: 402 mg/dL — ABNORMAL HIGH (ref 70–99)
Glucose-Capillary: 92 mg/dL (ref 70–99)
Glucose-Capillary: 139 mg/dL — ABNORMAL HIGH (ref 70–99)

## 2021-02-10 LAB — MAGNESIUM: Magnesium: 2 mg/dL (ref 1.7–2.4)

## 2021-02-10 LAB — C-REACTIVE PROTEIN: CRP: 0.6 mg/dL (ref <1.0)

## 2021-02-10 LAB — SEDIMENTATION RATE: Sed Rate: 41 mm/hr — ABNORMAL HIGH (ref 0–16)

## 2021-02-10 NOTE — Progress Notes (Signed)
Progress Note    Edward Atkinson  VZC:[id number] DOB: November 12, 1960  DOA: 01/13/2021 PCP: Rusty Aus, MD      Brief Narrative:    Medical records reviewed and are as summarized below:  Edward Atkinson is a 60 y.o. male with history of follicular lymphoma, JOINO-67 infection, interstitial lung disease and chronic respiratory failure, and type 2 diabetes mellitus.       Assessment/Plan:   Principal Problem:   Acute on chronic respiratory failure with hypoxia (HCC) Active Problems:   Type 2 diabetes mellitus with hypoglycemia without coma, without long-term current use of insulin (HCC)   Follicular lymphoma (HCC)   SOB (shortness of breath)   Interstitial lung disease (HCC)   Severe sepsis (HCC)   CAP (community acquired pneumonia)   Elevated troponin   Protein calorie malnutrition (HCC)   Protein-calorie malnutrition, severe   AF (paroxysmal atrial fibrillation) (HCC)   Thrombocytosis   Leukocytosis   Pressure injury of skin   Hyponatremia   Uncontrolled type 2 diabetes mellitus with hyperglycemia (HCC)   Nutrition Problem: Severe Malnutrition Etiology: chronic illness (lymphoma, ILD)  Signs/Symptoms: severe fat depletion, severe muscle depletion, percent weight loss Percent weight loss: 16 %   Body mass index is 20.3 kg/m.  Acute on chronic hypoxemic respiratory failure, exacerbation of interstitial lung disease: Oxygen therapy has been weaned down to 6 L/min oxygen.  He is on 3 L/min oxygen at baseline.  Continue to taper down oxygen as able.  Continue IV steroids for ILD exacerbation.  Any attempt to decrease steroids results no dissipation of ILD.  Follow-up with pulmonologist.  Type II DM with severe hyperglycemia: Steroids is contributing to severe hyperglycemia.  Continue insulin glargine and NovoLog.  Paroxysmal atrial fibrillation: Continue Eliquis  Follicular lymphoma: Completed chemotherapy.  S/p removal of Port-A-Cath on 02/09/2021  Plan  discussed with the patient and Dr. Lanney Gins (pulmonologist) at the bedside.    Diet Order             Diet Carb Modified Fluid consistency: Thin; Room service appropriate? Yes  Diet effective now                      Consultants: Pulmonologist Palliative care  Procedures: S/p removal of Port-A-Cath on 02/09/2021    Medications:    ALPRAZolam  0.25 mg Oral q morning   apixaban  5 mg Oral BID   Chlorhexidine Gluconate Cloth  6 each Topical Daily   digoxin  0.125 mg Oral Daily   diltiazem  120 mg Oral Daily   docusate sodium  200 mg Oral BID   feeding supplement (NEPRO CARB STEADY)  237 mL Oral TID BM   furosemide  20 mg Intravenous Daily   guaiFENesin  1,200 mg Oral BID   insulin aspart  0-15 Units Subcutaneous TID WC   insulin aspart  0-5 Units Subcutaneous QHS   insulin aspart  10 Units Subcutaneous TID WC   insulin glargine-yfgn  14 Units Subcutaneous BID   ipratropium-albuterol  3 mL Nebulization Q6H   methylPREDNISolone (SOLU-MEDROL) injection  40 mg Intravenous Q12H   multivitamin with minerals  1 tablet Oral Daily   mycophenolate  750 mg Oral BID   polyethylene glycol  17 g Oral Daily   sodium chloride flush  10-40 mL Intracatheter Q12H   sulfamethoxazole-trimethoprim  1 tablet Oral Q12H   Continuous Infusions:   Anti-infectives (From admission, onward)    Start  Dose/Rate Route Frequency Ordered Stop   01/31/21 1000  azithromycin (ZITHROMAX) tablet 250 mg  Status:  Discontinued       See Hyperspace for full Linked Orders Report.   250 mg Oral Daily 01/30/21 1532 01/31/21 1218   01/30/21 1630  azithromycin (ZITHROMAX) tablet 500 mg       See Hyperspace for full Linked Orders Report.   500 mg Oral Daily 01/30/21 1532 01/30/21 1617   01/30/21 1630  piperacillin-tazobactam (ZOSYN) IVPB 3.375 g  Status:  Discontinued        3.375 g 12.5 mL/hr over 240 Minutes Intravenous Every 8 hours 01/30/21 1540 01/31/21 1218   01/23/21 2200   sulfamethoxazole-trimethoprim (BACTRIM) 400-80 MG per tablet 1 tablet        1 tablet Oral Every 12 hours 01/23/21 1730     01/19/21 2000  sulfamethoxazole-trimethoprim (BACTRIM) 400-80 MG per tablet 1 tablet  Status:  Discontinued        1 tablet Oral Daily 01/19/21 1759 01/23/21 1730   01/15/21 2200  acyclovir (ZOVIRAX) tablet 400 mg  Status:  Discontinued        400 mg Oral 2 times daily 01/15/21 1558 01/15/21 1559   01/15/21 1645  acyclovir (ZOVIRAX) 200 MG capsule 400 mg  Status:  Discontinued        400 mg Oral 2 times daily 01/15/21 1558 02/02/21 1344   01/15/21 1515  acyclovir (ZOVIRAX) tablet 400 mg  Status:  Discontinued        400 mg Oral 2 times daily 01/15/21 1424 01/15/21 1556   01/14/21 1900  azithromycin (ZITHROMAX) 500 mg in sodium chloride 0.9 % 250 mL IVPB        500 mg 250 mL/hr over 60 Minutes Intravenous Every 24 hours 01/13/21 1835 01/20/21 2359   01/14/21 1900  cefTRIAXone (ROCEPHIN) 2 g in sodium chloride 0.9 % 100 mL IVPB  Status:  Discontinued        2 g 200 mL/hr over 30 Minutes Intravenous Every 24 hours 01/13/21 1835 01/13/21 2251   01/13/21 2345  ceFEPIme (MAXIPIME) 2 g in sodium chloride 0.9 % 100 mL IVPB  Status:  Discontinued        2 g 200 mL/hr over 30 Minutes Intravenous Every 8 hours 01/13/21 2309 01/19/21 2035   01/13/21 1800  azithromycin (ZITHROMAX) 500 mg in sodium chloride 0.9 % 250 mL IVPB        500 mg 250 mL/hr over 60 Minutes Intravenous  Once 01/13/21 1746 01/13/21 1958   01/13/21 1800  cefTRIAXone (ROCEPHIN) 1 g in sodium chloride 0.9 % 100 mL IVPB        1 g 200 mL/hr over 30 Minutes Intravenous  Once 01/13/21 1746 01/13/21 2054              Family Communication/Anticipated D/C date and plan/Code Status   DVT prophylaxis: SCDs Start: 01/13/21 1831 apixaban (ELIQUIS) tablet 5 mg     Code Status: Full Code  Family Communication: Non Disposition Plan:    Status is: Inpatient  Remains inpatient appropriate  because:Inpatient level of care appropriate due to severity of illness  Dispo: The patient is from: Home              Anticipated d/c is to: Home              Patient currently is not medically stable to d/c.   Difficult to place patient No  Subjective:   C/o shortness of breath with exertion.  Pulmonologist was at the bedside.  Objective:    Vitals:   02/10/21 0743 02/10/21 1111 02/10/21 1346 02/10/21 1522  BP: 116/76 112/68  125/82  Pulse: 84 (!) 102  97  Resp: _0 Temp: 97.9 F (36.6 C) 98.2 F (36.8 C)  98.1 F (36.7 C)  TempSrc: Oral Oral  Oral  SpO2: 96% 90% 94% 93%  Weight:      Height:       No data found.   Intake/Output Summary (Last 24 hours) at 02/10/2021 1705 Last data filed at 02/10/2021 1330 Gross per 24 hour  Intake 490 ml  Output 2600 ml  Net -2110 ml   Filed Weights   02/06/21 0500 02/07/21 0430 02/09/21 0613  Weight: 67 kg 66.3 kg 67.9 kg    Exam:  GEN: NAD SKIN: Warm and dry.  Stage II sacral decubitus ulcer EYES: EOMI ENT: MMM CV: RRR PULM: Bibasilar rales.  No wheezing heard. ABD: soft, ND, NT, +BS CNS: AAO x 3, non focal EXT: No edema or tenderness     Data Reviewed:   I have personally reviewed following labs and imaging studies:  Labs: Labs show the following:   Basic Metabolic Panel: Recent Labs  Lab 02/04/21 0500 02/09/21 0700 02/10/21 0534  NA 134* 135 133*  K 3.9 4.5 4.5  CL 95* 94* 93*  CO2 34* 32 32  GLUCOSE 146* 277* 384*  BUN _1 CREATININE 0.50* 0.76 0.63  CALCIUM 7.8* 9.1 8.7*  MG 1.9  --  2.0   GFR Estimated Creatinine Clearance: 91.9 mL/min (by C-G formula based on SCr of 0.63 mg/dL). Liver Function Tests: No results for input(s): AST, ALT, ALKPHOS, BILITOT, PROT, ALBUMIN in the last 168 hours. No results for input(s): LIPASE, AMYLASE in the last 168 hours. No results for input(s): AMMONIA in the last 168 hours. Coagulation profile No results for input(s): INR,  PROTIME in the last 168 hours.  CBC: Recent Labs  Lab 02/10/21 0534  WBC 7.1  HGB 10.1*  HCT 29.7*  MCV 86.6  PLT 186   Cardiac Enzymes: No results for input(s): CKTOTAL, CKMB, CKMBINDEX, TROPONINI in the last 168 hours. BNP (last 3 results) No results for input(s): PROBNP in the last 8760 hours. CBG: Recent Labs  Lab 02/09/21 1625 02/09/21 2141 02/10/21 0745 02/10/21 1112 02/10/21 1524  GLUCAP 134* 342* 334* 402* 92   D-Dimer: No results for input(s): DDIMER in the last 72 hours. Hgb A1c: No results for input(s): HGBA1C in the last 72 hours. Lipid Profile: No results for input(s): CHOL, HDL, LDLCALC, TRIG, CHOLHDL, LDLDIRECT in the last 72 hours. Thyroid function studies: No results for input(s): TSH, T4TOTAL, T3FREE, THYROIDAB in the last 72 hours.  Invalid input(s): FREET3 Anemia work up: No results for input(s): VITAMINB12, FOLATE, FERRITIN, TIBC, IRON, RETICCTPCT in the last 72 hours. Sepsis Labs: Recent Labs  Lab 02/10/21 0534  WBC 7.1    Microbiology Recent Results (from the past 240 hour(s))  Culture, BAL-quantitative w Gram Stain     Status: None   Collection Time: 02/08/21  7:50 AM   Specimen: SPU; Respiratory  Result Value Ref Range Status   Specimen Description   Final    SPUTUM Performed at [facility], [address], [place], [place] [address]    Special Requests   Final    NONE Performed at [facility], [place],  [place], [place] [address]    Gram Stain   Final    NO SQUAMOUS EPITHELIAL CELLS SEEN NO WBC SEEN FEW GRAM POSITIVE RODS FEW GRAM POSITIVE COCCI    Culture   Final    FEW Normal respiratory flora-no Staph aureus or Pseudomonas seen Performed at [facility], [address] [place], [place] [address]    Report Status 02/10/2021 FINAL  Final    Procedures and diagnostic studies:  PERIPHERAL VASCULAR CATHETERIZATION  Result Date: 02/09/2021 See surgical note for result.               LOS: 28 days   [place] [employer], advertising on [email or web address]. If 7PM-7AM, please contact night-coverage at [email or web address]     02/10/2021, 5:05 PM

## 2021-02-10 NOTE — Progress Notes (Signed)
Inpatient Diabetes Program Recommendations  AACE/ADA: New Consensus Statement on Inpatient Glycemic Control (2015)  Target Ranges:  Prepandial:   less than 140 mg/dL      Peak postprandial:   less than 180 mg/dL (1-2 hours)      Critically ill patients:  140 - 180 mg/dL  Results for NOSSON, WENDER" (MRN [id number]) as of 02/10/2021 13:01  Ref. Range 02/09/2021 07:23 02/09/2021 11:25 02/09/2021 13:58 02/09/2021 16:25 02/09/2021 21:41  Glucose-Capillary Latest Ref Range: 70 - 99 mg/dL 275 (H)  11 units Novolog  200 (H)  3 units Novolog   Semglee HELD  123 (H) 134 (H)  12 units Novolog  342 (H)  4 units Novolog   14 units Semglee  Results for FREELAND, PRACHT" (MRN [id number]) as of 02/10/2021 13:01  Ref. Range 02/10/2021 07:45 02/10/2021 11:12  Glucose-Capillary Latest Ref Range: 70 - 99 mg/dL 334 (H)  21 units Novolog  14 units Semglee  402 (H)  25 units Novolog     Home DM Meds: Amaryl 2 mg daily     Metformin 500 mg TID   Current Orders: Novolog Moderate Correction Scale/ SSI (0-15 units) TID AC + HS                            Novolog 10 units TID with meals       Semglee 14 units BID    MD- Note patient remains on Solumedrol 40 mg BID.  CBGs elevated likely due to steroids.  Of note, AM dose of Semglee was HELD yesterday AM.  Please consider:  1. Increase Semglee to 17 units BID (20% overall increase)  2. Increase Novolog Meal Coverage to 12 units TID with meals    --Will follow patient during hospitalization--  Wyn Quaker RN, MSN, CDE Diabetes Coordinator Inpatient Glycemic Control Team Team Pager: [phone] (8a-5p)

## 2021-02-10 NOTE — Progress Notes (Signed)
Physical Therapy Treatment Patient Details Name: Edward Atkinson MRN: [id number] DOB: January 22, 1961 Today's Date: 02/10/2021   History of Present Illness Pt is a 60 y.o. male with medical history significant for chronic hypoxic respiratory failure on 2 L continuous nasal cannula in the setting of interstitial lung disease, type 2 diabetes mellitus, pulmonary embolism in 2020 now chronically anticoagulated on Eliquis, who arrived to ED with shortness of breath. MD assessment includes:  acute on chronic hypoxic respiratory failure with hypoxia, possible sepsis due to bilateral pneumonia, rapid atrial fibrillation, anemia, and mild elevation of troponin with suspected demand ischemia.    PT Comments    Pt was long sitting in bed on 6 L HFNC upon arriving. Agrees to PT session and continues to be very motivated and cooperative throughout. Attempted to get OOB on 6 L HFNC however pt quickly desaturates to 72% with HR elevation to 130s and RR to upper 40s. Returned to long sitting in bed with prolonged recover~ 5 minutes. Attempted OOB again however this time on 10 L HFNC. Was abl to progress to EOB short sit with slight desaturation to 86% with HR to upper 120s. Sat EOB x 5 minutes on 10 L HFNC prior to standing and going to recliner without AD. During standing and taking steps to recliner, desaturates to 84% with HR to 130s. Does take ~ 7 minutes to recover to >88%. RR elevated throughout session with all activity even just talking in bed. RN/Md all aware of pt's respiratory response to very limited activity. No strength or balance deficits however hard to assess with such minimal activity tolerance. Recommend DC home with [facility] services once deemed medically stable.    Recommendations for follow up therapy are one component of a multi-disciplinary discharge planning process, led by the attending physician.  Recommendations may be updated based on patient status, additional functional criteria and insurance  authorization.  Follow Up Recommendations  Home health PT;Supervision for mobility/OOB     Equipment Recommendations  Rolling walker with 5" wheels;3in1 (PT)       Precautions / Restrictions Precautions Precautions: Fall Precaution Comments: O2!!! Restrictions Weight Bearing Restrictions: No     Mobility  Bed Mobility Overal bed mobility: Modified Independent      General bed mobility comments: pt was on 6 L HFNC upon arriving however required increase to 10 L to tolerate very minimal activity. attempted to perform on 6 L HFNC but pt quickly desaturates to low 70s with just attempting to sit up. On 10 L hfnc was able to tolerate much more activity with less respiratory distress    Transfers Overall transfer level: Needs assistance Equipment used: None Transfers: Sit to/from Stand Sit to Stand: Supervision         General transfer comment: pt has strength required to perform activity but is severely limited by respiratory response to activity  Ambulation/Gait Ambulation/Gait assistance: Supervision Gait Distance (Feet): 5 Feet Assistive device: None   Gait velocity: decreased   General Gait Details: Pt requires prolonged recovery time with only ambulating from EOB to recliner.      Balance Overall balance assessment: Needs assistance Sitting-balance support: Feet supported;No upper extremity supported Sitting balance-Leahy Scale: Normal     Standing balance support: Bilateral upper extremity supported;No upper extremity supported;During functional activity Standing balance-Leahy Scale: Good Standing balance comment: limited time to assess due to SOB/desaturation      Cognition Arousal/Alertness: Awake/alert Behavior During Therapy: WFL for tasks assessed/performed Overall Cognitive Status: Within Functional Limits for tasks  assessed          General Comments: Very pleasant & agreeable to tx.             Pertinent Vitals/Pain Pain Assessment:  No/denies pain     PT Goals (current goals can now be found in the care plan section) Acute Rehab PT Goals Patient Stated Goal: walk again without being so SOB Progress towards PT goals: Progressing toward goals    Frequency    Min 2X/week      PT Plan Current plan remains appropriate       AM-PAC PT "6 Clicks" Mobility   Outcome Measure  Help needed turning from your back to your side while in a flat bed without using bedrails?: None Help needed moving from lying on your back to sitting on the side of a flat bed without using bedrails?: None Help needed moving to and from a bed to a chair (including a wheelchair)?: A Little Help needed standing up from a chair using your arms (e.g., wheelchair or bedside chair)?: A Little Help needed to walk in hospital room?: A Little Help needed climbing 3-5 steps with a railing? : A Little 6 Click Score: 20    End of Session Equipment Utilized During Treatment: Oxygen (returned to 6 L HFNC at conclusion of session with RN aware) Activity Tolerance: Patient tolerated treatment well;Patient limited by fatigue Patient left: in chair;with call bell/phone within reach;with chair alarm set Nurse Communication: Mobility status PT Visit Diagnosis: Muscle weakness (generalized) (M62.81);Difficulty in walking, not elsewhere classified (R26.2)     Time: 4562-5638 PT Time Calculation (min) (ACUTE ONLY): 30 min  Charges:  $Therapeutic Activity: 23-37 mins                    Julaine Fusi PTA 02/10/21, 3:45 PM

## 2021-02-10 NOTE — Progress Notes (Signed)
[facility] Daily Progress Note  02/09/21: Removal of Infuse-a-Port  Subjective: Patient without complaint this AM.  No acute issues overnight.  Objective: Vitals:   02/10/21 0147 02/10/21 0736 02/10/21 0743 02/10/21 1111  BP:   116/76 112/68  Pulse:   84 (!) 115  Resp:   18 18  Temp:   97.9 F (36.6 C) 98.2 F (36.8 C)  TempSrc:   Oral Oral  SpO2: 96% 94% 96% 90%  Weight:      Height:        Intake/Output Summary (Last 24 hours) at 02/10/2021 1238 Last data filed at 02/10/2021 1113 Gross per 24 hour  Intake 710 ml  Output 2300 ml  Net -1590 ml    Physical Exam: A&Ox3, NAD CV: RRR Pulmonary: CTA Bilaterally Abdomen: Soft, Nontender, Nondistended Vascular:  Right sided Port-A-Cath: Successfully removed.  Incision with Dermabond clean and intact.   Laboratory: CBC    Component Value Date/Time   WBC 7.1 02/10/2021 0534   HGB 10.1 (L) 02/10/2021 0534   HCT 29.7 (L) 02/10/2021 0534   PLT 186 02/10/2021 0534   BMET    Component Value Date/Time   NA 133 (L) 02/10/2021 0534   K 4.5 02/10/2021 0534   CL 93 (L) 02/10/2021 0534   CO2 32 02/10/2021 0534   GLUCOSE 384 (H) 02/10/2021 0534   BUN 21 02/10/2021 0534   CREATININE 0.63 02/10/2021 0534   CALCIUM 8.7 (L) 02/10/2021 0534   GFRNONAA >60 02/10/2021 0534   GFRAA >60 01/07/2020 0819   Assessment/Planning: The patient is a 60 year old male status post removal of his right-sided Port-A-Cath - POD#1  1) successful removal of the patient's right-sided Port-A-Cath 2) incision with Dermabond intact clean and dry 3) patient understands Dermabond will flake off on its own over the next week or 2.  He also understands that the sutures are underneath the skin. 4) no further recommendations from vascular surgery at this time.  Vascular to sign off.  Discussed with Dr. Eber Hong Male Iafrate PA-C 02/10/2021 12:38 PM

## 2021-02-10 NOTE — Progress Notes (Signed)
Pulmonary Medicine          Date: 02/10/2021,   MRN# [id number] Edward Atkinson 1960/12/03     AdmissionWeight: 63.5 kg                 CurrentWeight: 67.9 kg   Referring physician: Dr Roderic Palau   CHIEF COMPLAINT:   Acute on chronic hypoxemic respiratory failure   HISTORY OF PRESENT ILLNESS   60 yo M w/hx of chronic hypoxemia and ILD post COVID19, T2DM, PE on eliquis, Lymphoma s/p Rituxan which is completed.  Came in due to worsening SOB and DOE specifically mild exertional defacation/urination with feelings of presyncope and severe dyspnea which started to get worse post tapering from steroid appx few days prior to onset of symptom progression.  He was placed on solumedrol and reported mild improvement.  He is on 10L /min [place] during my evaluation.  He had CT chest done with PE protocol and noted to have bilateral GGO infiltrates worse compared to June study. PCCM consultation for further evaluation and management.     01/17/21-  patient seems to be slighly clinically worse post reduction of IV steroids yesterday indicative of ongoing ILD exacerbation, thus far infectious workup is negative but inflammatory biomarkers are highly elevated also pointing towards ILD exacerbation.  There is new CXR in process this morning for interval changes. We discussed increasing steroids slightly and giving more time.  I will also initiate steroid sparing immunomodulation with BID cellcept as patient will likely need therapy for prolonged time period.  Compensatory tachycardia noted appreciate cardiology team.  Sugars with peaks and valleys with adjusted steroid therapy.  Patient with muscle wasting and protein calorie malnutrition but eating better this am.  01/18/21- patient is on HFNC at 60%, feels better, has not been able to tolerate BIPAP.  Wife is at bedside.  BP is borderline low MAP 76.  He has negative infectious workup and thus far ILD exacerbation seems to be main differential. He  continues to have peaks and valleys in blood glucose.  We started steroid sparing regimen with cellcept bid 250 mg and will increase dose as we taper off prednisone.  Remains tachyarrtythmic but improved. Appreciate everyone involved.     01/19/21- patient seen and examined at bedside.  Wife present during evaluation.  Ow HFNC weaned to 41%/45L/MIN.  CRP is still up, have increased cellcept and reducing solumedrol.  Patient felt stronger physically and was able to get OOB to chair. Cough is less now.  He is non-labored with breathing no accessory mm use today. He had dark stools with reduced hb, FOBT ordred.  Haptoglobin is elevated. Pathology slide review ordered.   01/20/21- patient is essentially unchanged from yesterday from pulmonary perspective. CXR also unchanged,  bloodwork with reduction in CRP.  Plan to wean O2 and continue OOB with PT/OT,  Continue current steroid dose and Cellcept.   01/21/21- patient is stable on HFNC.  He had small set back with increased O2 req after exerting himself in bathroom.  HFNC from 41/45 to 54/45.   He diuresed very well with lasix 20 bid >2L urine in past 24h.  Tachycardia is improved. He does not feel weaker and infact states he feels slowly improved.   01/22/21- I spoke to patient regarding goals of care and recommend DNR.  He is agreeable but I asked him to discuss with wife and family out of respect and we will revisit tommorow.  He states he is feeling better  and CRP is trending down but he remains on HFNC 60/40.  He was able to get OOB today and walked to bathroom but experienced mild desaturation with quick recovery "not much issues".      01/23/21- patient is still on high flow nasal canula , he is on 56%/45L.  We reviewed code status and he is still in discussion with family regarding DNR.   We discussed lung transplant per family request and this may be a future option if he is strong enough and recovers.  I have increased insluin TID to 20 from 15 and  increased bactrim to bid SS   01/24/21- patient weaned on HFNC to 49/45, he was able to use BIPAP overnight thinks its helping.  CXR this AM with interval improvement. Will keep on current dose of meds.  Repeat CRP in am.  Last BM yesterday well formed no blood, eating good, urine output very good on 20 bid lasix. Getting OOB with  transient desaturation.  Overall slight improvement.    01/25/21- patient has weaned down to 35%/40L/min.  He remains full code. Reviewed medical plan with Dr Jimmye Norman today.  Reduced steroids from 50 BID to 40 BID and also reduced insulin slightly.    01/26/21- patient is further weaned to 15L humidified HF nasal canula.  He did PT ROM exercises with mild desaturation. CXR today with expiratory image unable to see much interval changes. Patient reports improvement clinically. Plan to continue current regimen. Sugars slightly high due to steroids, will wean steroids soon. Continue cellcept as current 500 bid  01/27/21- patient is relatively same as yesterday, his O2 is weaned down to 14L.  He remains on IV solumedrol at 50 bid, we did discuss weaning this further to 40 BID. Will keep cellcept at current dose 500 bid.  His ankles have been down in seated position and are edematous on examination. He continues to participate with PT and feels good subjectively. We discussed starting dose of lasix for pedal edema.    01/28/21- patient examined at bedside, s/p PT/OT session with slight dyspnea.  He was weaned to 10L O2 but then went back up to 13 post PT due to increased WOB.  He has complete resolution of LE edema and appears to be euvolemic at this point. We discussed reducing soluemdrol today to 20 bid.  I reviewed plan with Dr Leslye Peer today.  01/29/21- patient examined at bedside.  He had a good day and feels better. We reviewed medical plan and are reducing steroids to PO prednisone starting at 1m daily.   01/30/21-  Patient seen and examined at bedside.  CXR this am with  worsening pulmonary edema, included below.  Vitals stable with mild tachypnea and tachycardia with activity. Will deliver 1 dose IV lasix 20 today. Plan to continue Pred PO and cellcept at 500 bid. Pulmonary intensive PT with IS and will initiate Metaneb therapy with albuterol. Blood work today will be repeated CRP CMP phos procal ESR.  Patient is agreeable to BIPAP QHS and Metaneb BID.   01/31/21-  patient had set back yesterday with mild flash pulm edema on CXR. He diuresed very well overnight and feels back to where he was at previously.  He had transient reactive leukocytosis as well.  We initiated empirically zosyn and zithromax on top of bid bactrim.  This probably could be dcd since he recovered so quickly which would not occur with pneumonia.  Ill go head and dc this today but continue PO bactrim.  Will stay on pred 50 today with cellcept at 500 bid as previous.  His O2 has been reduced from 95 to 70% overnight and hes working with PT today. Hopefully we can wean this back down to 10L as he was previosly then back to his home setting.   02/01/21- patient is somewhat better today, he is being weaned on O2 and is now down to 40%. He had BM today. Leaving prednisone at current dose and cxr today.   02/02/21- patient is now on 15L HF bubbler.  He reports feeling better.  He had 3 bowel movements after stool softner. He is working with PT/OT.  His long term prognosis is poor unfortunately and we appreciate collaboration with Palliative care team.   02/03/21- patient had over exerted himself again and required increased O2. He states he feels better and just did more then he should have with PT.  His wbc count is trending down. His CRP is trending down.  I have started morhpine for air hunger and pain at sacrum for skin breakdown and pressure ulceration.     02/04/21- patient is unchanged.  He wants to keep fighting to improve.  CRP is trending down again.  ESR is upward trending. He was unable to perform  adequately with PT and had severe dyspnea and desaturation just to get OOB to chair. Discussed case with [employer] to see if he can have HFNC at home but the device has been recalled. Discussed patient with Dr Roosevelt Locks plan is for additional diuresis.  I have increased cellcept to 750 BID today  02/05/21- patient with UOP >2L yesterday, his spO2 req remains HFNC 35/40.  He feels well and wants to keep working to be discharged to rehab. We have ordered NIV for him.  Patient has pulmonary fibrosis with restrictive lung disease and would benefit from device such as Trilogy 100/200 with O2 bleed in.  His overall prognosis is poor.   02/06/21- patient is back up to 50%/45L.  He clinically feels well but just not able to get off the oxygen or improve as well as we had hoped.  I had lengthy discussion with patient he wishes to continue current inpatient therapy and wants to be more aggressive medically.  I have increased steroids to solumedrol 40 bid again.    02/07/21- patient is unchanged, he states he feels well.  Sitting up in chair speaking infull sentences but still rquiring HFNC at 50%/45L.  Today will perform HRCT to evaluate details of pulmonary parenchyma.   02/08/21- patient s/p HRCT with bilateral pulmonary fibrosis and bronchiectasis.  We discussed [employer] VEST therapy.  Reviewed with oncology will plan to remove chest port to allow patient to use vest therapy.  He is working with PT to wean O2.   02/09/21- patient weaned to 9L/min.  For port removal today.   02/10/21- patient weaned to 6L min.  Reviewed care plan with patient and attending physician  PAST MEDICAL HISTORY   Past Medical History:  Diagnosis Date  . Chronic respiratory failure with hypoxia (HCC)    2L [place] continuous  . COVID-19   . Diabetes mellitus without complication ([place])   . Diverticulitis   . Follicular lymphoma of intra-abdominal lymph nodes ([place]) 02/15/2019  . ILD (interstitial lung disease) ([place])      SURGICAL HISTORY    Past Surgical History:  Procedure Laterality Date  . CHOLECYSTECTOMY    . COLON RESECTION     DUE TO DIVERTICULITIS  . FLEXIBLE BRONCHOSCOPY N/A  12/02/2019   Procedure: FLEXIBLE BRONCHOSCOPY;  Surgeon: Ottie Glazier, MD;  Location: [facility];  Service: Thoracic;  Laterality: N/A;  . PORTA CATH INSERTION N/A 03/11/2019   Procedure: PORTA CATH INSERTION;  Surgeon: Algernon Huxley, MD;  Location: [place] [facility];  Service: Cardiovascular;  Laterality: N/A;  . PORTA CATH REMOVAL Right 02/09/2021   Procedure: PORTA CATH REMOVAL;  Surgeon: Katha Cabal, MD;  Location: [place] [facility];  Service: Cardiovascular;  Laterality: Right;  . PULMONARY THROMBECTOMY N/A 01/29/2019   Procedure: PULMONARY THROMBECTOMY;  Surgeon: Algernon Huxley, MD;  Location: [place] [facility];  Service: Cardiovascular;  Laterality: N/A;     FAMILY HISTORY   Family History  Problem Relation Age of Onset  . Diabetes Brother      SOCIAL HISTORY   Social History   Tobacco Use  . Smoking status: Never  . Smokeless tobacco: Never  Vaping Use  . Vaping Use: Never used  Substance Use Topics  . Alcohol use: Yes    Alcohol/week: 0.0 - 2.0 standard drinks    Comment: occasional  . Drug use: Never     MEDICATIONS    Home Medication:    Current Medication:  Current Facility-Administered Medications:  .  acetaminophen (TYLENOL) tablet 650 mg, 650 mg, Oral, Q6H PRN **OR** acetaminophen (TYLENOL) suppository 650 mg, 650 mg, Rectal, Q6H PRN, Oswald Hillock, RPH .  ALPRAZolam (XANAX) tablet 0.25 mg, 0.25 mg, Oral, QHS PRN, Sharen Hones, MD .  ALPRAZolam Duanne Moron) tablet 0.25 mg, 0.25 mg, Oral, q morning, Sharen Hones, MD, 0.25 mg at 02/10/21 0902 .  apixaban (ELIQUIS) tablet 5 mg, 5 mg, Oral, BID, Beers, Shanon Brow, RPH, 5 mg at 02/10/21 0901 .  Chlorhexidine Gluconate Cloth 2 % PADS 6 each, 6 each, Topical, Daily, Kathie Dike, MD, 6 each at 02/09/21 0828 .  digoxin (LANOXIN) tablet 0.125 mg, 0.125  mg, Oral, Daily, Memon, Jehanzeb, MD, 0.125 mg at 02/10/21 0902 .  diltiazem (CARDIZEM CD) 24 hr capsule 120 mg, 120 mg, Oral, Daily, Shila Kruczek, MD, 120 mg at 02/10/21 0902 .  diltiazem (CARDIZEM) injection 10 mg, 10 mg, Intravenous, Q6H PRN, Wyvonnia Dusky, MD, 10 mg at 01/24/21 2328 .  docusate sodium (COLACE) capsule 200 mg, 200 mg, Oral, BID, Wyvonnia Dusky, MD, 200 mg at 02/10/21 0901 .  feeding supplement (NEPRO CARB STEADY) liquid 237 mL, 237 mL, Oral, TID BM, Memon, Jehanzeb, MD, Last Rate: 0 mL/hr at 01/21/21 2130, 237 mL at 02/10/21 0904 .  furosemide (LASIX) injection 20 mg, 20 mg, Intravenous, Daily, Lanney Gins, Horton Ellithorpe, MD, 20 mg at 02/10/21 0905 .  guaiFENesin (MUCINEX) 12 hr tablet 1,200 mg, 1,200 mg, Oral, BID, Kathie Dike, MD, 1,200 mg at 02/10/21 0901 .  guaiFENesin-dextromethorphan (ROBITUSSIN DM) 100-10 MG/5ML syrup 5 mL, 5 mL, Oral, Q4H PRN, Kathie Dike, MD, 5 mL at 02/04/21 0850 .  HYDROmorphone (DILAUDID) injection 1 mg, 1 mg, Intravenous, Once PRN, Stegmayer, Kimberly A, PA-C .  insulin aspart (novoLOG) injection 0-15 Units, 0-15 Units, Subcutaneous, TID WC, Sharen Hones, MD, 11 Units at 02/10/21 [phone] .  insulin aspart (novoLOG) injection 0-5 Units, 0-5 Units, Subcutaneous, QHS, Sharen Hones, MD, 4 Units at 02/09/21 2156 .  insulin aspart (novoLOG) injection 10 Units, 10 Units, Subcutaneous, TID WC, Loletha Grayer, MD, 10 Units at 02/10/21 0902 .  insulin glargine-yfgn (SEMGLEE) injection 14 Units, 14 Units, Subcutaneous, BID, Sharen Hones, MD, 14 Units at 02/10/21 0904 .  ipratropium-albuterol (DUONEB) 0.5-2.5 (3) MG/3ML  nebulizer solution 3 mL, 3 mL, Nebulization, Q6H, Sharen Hones, MD, 3 mL at 02/10/21 0736 .  lactulose (CHRONULAC) 10 GM/15ML solution 30 g, 30 g, Oral, BID PRN, Leslye Peer, Richard, MD .  levalbuterol [facility]) nebulizer solution 1.25 mg, 1.25 mg, Nebulization, Q4H PRN, Howerter, Justin B, DO, 1.25 mg at 01/17/21 2011 .  methylPREDNISolone  sodium succinate (SOLU-MEDROL) 40 mg/mL injection 40 mg, 40 mg, Intravenous, Q12H, Lanney Gins, Miangel Flom, MD, 40 mg at 02/10/21 0147 .  morphine 2 MG/ML injection 1 mg, 1 mg, Intravenous, Q3H PRN, Ottie Glazier, MD, 1 mg at 02/06/21 1412 .  multivitamin with minerals tablet 1 tablet, 1 tablet, Oral, Daily, Kathie Dike, MD, 1 tablet at 02/08/21 1206 .  mycophenolate (CELLCEPT) capsule 750 mg, 750 mg, Oral, BID, Lanney Gins, Clois Montavon, MD, 750 mg at 02/10/21 0902 .  ondansetron (ZOFRAN) injection 4 mg, 4 mg, Intravenous, Q6H PRN, Stegmayer, Kimberly A, PA-C .  polyethylene glycol (MIRALAX / GLYCOLAX) packet 17 g, 17 g, Oral, Daily, Leslye Peer, Richard, MD, 17 g at 02/10/21 0902 .  simethicone (MYLICON) chewable tablet 80 mg, 80 mg, Oral, QID PRN, Loletha Grayer, MD, 80 mg at 01/29/21 1411 .  sodium chloride (OCEAN) 0.65 % nasal spray 1 spray, 1 spray, Each Nare, PRN, Loletha Grayer, MD, 1 spray at 01/30/21 2142 .  sodium chloride flush (NS) 0.9 % injection 10-40 mL, 10-40 mL, Intracatheter, Q12H, Kathie Dike, MD, 10 mL at 02/10/21 0904 .  sodium chloride flush (NS) 0.9 % injection 10-40 mL, 10-40 mL, Intracatheter, PRN, Kathie Dike, MD, 10 mL at 01/24/21 0915 .  sulfamethoxazole-trimethoprim (BACTRIM) 400-80 MG per tablet 1 tablet, 1 tablet, Oral, Q12H, Ottie Glazier, MD, 1 tablet at 02/10/21 0902 .  traZODone (DESYREL) tablet 50 mg, 50 mg, Oral, QHS PRN, Sharion Settler, NP, 50 mg at 02/08/21 2225    ALLERGIES   Penicillins     REVIEW OF SYSTEMS    Review of Systems:  Gen:  Denies  fever, sweats, chills weigh loss  HEENT: Denies blurred vision, double vision, ear pain, eye pain, hearing loss, nose bleeds, sore throat Cardiac:  No dizziness, chest pain or heaviness, chest tightness,edema Resp:  admits to severe dyspnea Gi: Denies swallowing difficulty, stomach pain, nausea or vomiting, diarrhea, constipation, bowel incontinence Gu:  Denies bladder incontinence, burning urine Ext:    Denies Joint pain, stiffness or swelling Skin: Denies  skin rash, easy bruising or bleeding or hives Endoc:  Denies polyuria, polydipsia , polyphagia or weight change Psych:   admits to axniety  Other:  All other systems negative   VS: BP 112/68 (BP Location: Right Arm)   Pulse (!) 115   Temp 98.2 F (36.8 C) (Oral)   Resp 18   Ht 6' (1.829 m)   Wt 67.9 kg   SpO2 90%   BMI 20.30 kg/m      PHYSICAL EXAM    GENERAL:NAD, no fevers, chills, no weakness no fatigue HEAD: Normocephalic, atraumatic.  EYES: Pupils equal, round, reactive to light. Extraocular muscles intact. No scleral icterus.  MOUTH: Moist mucosal membrane. Dentition intact. No abscess noted.  EAR, NOSE, THROAT: Clear without exudates. No external lesions.  NECK: Supple. No thyromegaly. No nodules. No JVD.  PULMONARY: Bilateral rhonchi improved from previous CARDIOVASCULAR: S1 and S2. Regular rate and rhythm. No murmurs, rubs, or gallops. No edema. Pedal pulses 2+ bilaterally.  GASTROINTESTINAL: Soft, nontender, nondistended. No masses. Positive bowel sounds. No hepatosplenomegaly.  MUSCULOSKELETAL: No swelling, clubbing, or edema. Range of motion full in all extremities.  NEUROLOGIC: Cranial nerves II through XII are intact. No gross focal neurological deficits. Sensation intact. Reflexes intact.  SKIN: No ulceration, lesions, rashes, or cyanosis. Skin warm and dry. Turgor intact.  PSYCHIATRIC: Mood, affect within normal limits. The patient is awake, alert and oriented x 3. Insight, judgment intact.       IMAGING    CT Angio Chest PE W/Cm &/Or Wo Cm  Result Date: 01/13/2021 CLINICAL DATA:  Respiratory distress, hypoxia EXAM: CT ANGIOGRAPHY CHEST WITH CONTRAST TECHNIQUE: Multidetector CT imaging of the chest was performed using the standard protocol during bolus administration of intravenous contrast. Multiplanar CT image reconstructions and MIPs were obtained to evaluate the vascular anatomy. CONTRAST:  44m  OMNIPAQUE IOHEXOL 350 MG/ML SOLN COMPARISON:  10/26/2020, 01/13/2021 FINDINGS: Cardiovascular: This is a technically adequate evaluation of the pulmonary vasculature. No filling defects or pulmonary emboli. The heart is unremarkable without pericardial effusion. No evidence of thoracic aortic aneurysm or dissection. Right chest wall port via internal jugular approach tip within the superior vena cava. Mediastinum/Nodes: No pathologic adenopathy within the mediastinum, hila, or axilla. Thyroid, trachea, and esophagus are unremarkable. Lungs/Pleura: There has been interval progression of the multifocal ground-glass airspace disease seen previously. Changes are most pronounced within the dependent lower lobes. No effusion or pneumothorax. The central airways are patent, with persistent bronchiectasis. Upper Abdomen: No acute abnormality. Musculoskeletal: No acute or destructive bony lesions. Reconstructed images demonstrate no additional findings. Review of the MIP images confirms the above findings. IMPRESSION: 1. No evidence of pulmonary embolus. 2. Progressive multifocal bilateral ground-glass airspace disease, greatest at the lung bases. The differential diagnosis would include progressive postinflammatory scarring and fibrosis, multifocal atypical pneumonia, or hypersensitivity/drug toxicity. 3. Stable bronchiectasis. Electronically Signed   By: MRanda NgoM.D.   On: 01/13/2021 17:13   CT Chest High Resolution  Result Date: 02/07/2021 CLINICAL DATA:  Inpatient. Interstitial lung disease. Worsening dyspnea. Chronic hypoxemia post COVID-19. History of lymphoma treated with Rituxan. EXAM: CT CHEST WITHOUT CONTRAST TECHNIQUE: Multidetector CT imaging of the chest was performed following the standard protocol without intravenous contrast. High resolution imaging of the lungs, as well as inspiratory and expiratory imaging, was performed. COMPARISON:  02/05/2021 chest radiograph. 01/13/2021 chest CT angiogram.  FINDINGS: Cardiovascular: Normal heart size. No significant pericardial effusion/thickening. Right internal jugular central venous catheter terminates at the cavoatrial junction. Normal course and caliber of the thoracic aorta. Stable dilated main pulmonary artery (3.7 cm diameter). Mediastinum/Nodes: No discrete thyroid nodules. Unremarkable esophagus. No pathologically enlarged axillary, mediastinal or hilar lymph nodes, noting limited sensitivity for the detection of hilar adenopathy on this noncontrast study. Lungs/Pleura: No pneumothorax. No pleural effusion. Severe patchy confluent peribronchovascular and peripheral ground-glass opacity, reticulation, traction bronchiectasis, architectural distortion and volume loss in both lungs with a basilar predominance. The ground-glass opacities have mildly improved since 01/13/2021 chest CT. The bronchiectasis, distortion and volume loss appear slightly worsened. No significant lobular air trapping or evidence of tracheobronchomalacia on the expiration sequence. No acute consolidative airspace disease, lung masses or significant pulmonary nodules. No frank honeycombing. Upper abdomen: Chronic mild elevation of the right hemidiaphragm. Musculoskeletal: No aggressive appearing focal osseous lesions. Mild thoracic spondylosis. IMPRESSION: 1. Spectrum of findings compatible with severe basilar predominant fibrotic interstitial lung disease with ground-glass and no frank honeycombing predominance. Ground-glass opacities have slightly improved and bronchiectasis and other findings of fibrosis have slightly worsened since recent 01/13/2021 chest CT angiogram study. Findings are most compatible with a severe rapidly progressive postinflammatory fibrosis or fibrotic NSIP pattern. Findings are  suggestive of an alternative diagnosis (not UIP) per consensus guidelines: Diagnosis of Idiopathic Pulmonary Fibrosis: An Official ATS/ERS/JRS/ALAT Clinical Practice Guideline. L[place], Iss 5, [id number], Dec 31 2016. 2. Stable dilated main pulmonary artery, suggesting chronic pulmonary arterial hypertension. 3. Chronic mild elevation of the right hemidiaphragm. Electronically Signed   By: Ilona Sorrel M.D.   On: 02/07/2021 17:29   PERIPHERAL VASCULAR CATHETERIZATION  Result Date: 02/09/2021 See surgical note for result.  DG Chest Port 1 View  Result Date: 02/05/2021 CLINICAL DATA:  Bilateral pulmonary infiltrates on chest x-ray. EXAM: PORTABLE CHEST 1 VIEW COMPARISON:  Chest radiograph 02/01/2021 FINDINGS: Power injectable right IJ port central venous catheter tip projects at the level of the superior cavoatrial junction. Unchanged low lung volumes with lower lobe predominant interstitial opacities, left greater than right. No new focal consolidation, pleural effusion, or pneumothorax. Heart is normal in size. Surgical clips in the right upper abdomen. IMPRESSION: Bilateral interstitial airspace opacities are unchanged compared to 02/01/2021. No new focal consolidation, pleural effusion, or pneumothorax. Electronically Signed   By: Ileana Roup M.D.   On: 02/05/2021 10:59   DG Chest Port 1 View  Result Date: 02/01/2021 CLINICAL DATA:  Shortness of breath, edema EXAM: PORTABLE CHEST 1 VIEW COMPARISON:  01/30/2021 FINDINGS: Stable positioning of right IJ approach Port-A-Cath. Heart size within normal limits. Low lung volumes. Slight interval worsening of bilateral airspace opacities, left worse than right. No pleural effusion or pneumothorax. IMPRESSION: Slight interval worsening of bilateral airspace opacities, left worse than right. Electronically Signed   By: Davina Poke D.O.   On: 02/01/2021 14:37   DG Chest Port 1 View  Result Date: 01/30/2021 CLINICAL DATA:  Acute on chronic hypoxemic respiratory failure. EXAM: PORTABLE CHEST 1 VIEW COMPARISON:  January 28, 2021 FINDINGS: No pneumothorax. Stable cardiomediastinal silhouette and right Port-A-Cath.  Left-greater-than-right pulmonary opacities/infiltrates. No other interval changes. IMPRESSION: 1. Persistent low lung volumes and bilateral pulmonary infiltrates. No interval changes. Electronically Signed   By: Dorise Bullion III M.D.   On: 01/30/2021 08:36   DG Chest Port 1 View  Result Date: 01/28/2021 CLINICAL DATA:  Shortness of breath. EXAM: PORTABLE CHEST 1 VIEW COMPARISON:  Multiple recent chest films. FINDINGS: The right IJ power port is stable. Persistent low lung volumes and bilateral pulmonary infiltrates. No definite pleural effusion or pneumothorax. IMPRESSION: Persistent low lung volumes and bilateral pulmonary infiltrates. Electronically Signed   By: Marijo Sanes M.D.   On: 01/28/2021 16:03   DG Chest Port 1 View  Result Date: 01/26/2021 CLINICAL DATA:  Shortness of breath, abnormal chest x-ray EXAM: PORTABLE CHEST 1 VIEW COMPARISON:  01/24/2021 FINDINGS: Right Port-A-Cath remains in place, unchanged. Low lung volumes with bilateral airspace opacities, most pronounced in the left perihilar and lower lobe, unchanged since prior study. No visible effusions or pneumothorax. Heart is normal size. IMPRESSION: Low lung volumes with bilateral airspace disease, left-greater-than-right. No significant change. Electronically Signed   By: Rolm Baptise M.D.   On: 01/26/2021 09:42   DG Chest Port 1 View  Result Date: 01/24/2021 CLINICAL DATA:  Follow-up multifocal pneumonia. EXAM: PORTABLE CHEST 1 VIEW COMPARISON:  01/22/2021 and older exams. FINDINGS: Mild interval decrease in the airspace lung opacities, most evident on the left. Lung volumes remain low. No new lung abnormalities. No visualized pleural effusion.  No pneumothorax. Right internal jugular Port-A-Cath is stable. IMPRESSION: 1. Mild interval improvement in lung aeration with decreased airspace lung opacities, most notably on the left.  Electronically Signed   By: Lajean Manes M.D.   On: 01/24/2021 07:57   DG Chest Port 1  View  Result Date: 01/22/2021 CLINICAL DATA:  Hypoxia, shortness of breath. EXAM: PORTABLE CHEST 1 VIEW COMPARISON:  January 20, 2021. FINDINGS: Stable cardiomediastinal silhouette. Right internal jugular Port-A-Cath is unchanged in position. Hypoinflation of the lungs is noted. Stable bilateral lung opacities are noted, left greater than right, most consistent with multifocal pneumonia. Bony thorax is unremarkable. IMPRESSION: Hypoinflation of the lungs. Stable bilateral lung opacities are noted, left greater than right, most consistent with multifocal pneumonia. Electronically Signed   By: Marijo Conception M.D.   On: 01/22/2021 19:26   DG Chest Port 1 View  Result Date: 01/20/2021 CLINICAL DATA:  Pneumonitis EXAM: PORTABLE CHEST 1 VIEW COMPARISON:  01/19/2021 FINDINGS: Shallow inspiration. Patchy infiltrates in the left lung and right lung base, similar to prior study and likely multifocal pneumonia. No pleural effusions. No pneumothorax. Mediastinal contours appear intact. Heart size is normal. Power port type central venous catheter on the right with tip over the cavoatrial junction region. Surgical clips in the right upper quadrant. IMPRESSION: Shallow inspiration. Patchy infiltrates in both lungs, likely multifocal pneumonia, without change. Electronically Signed   By: Lucienne Capers M.D.   On: 01/20/2021 03:56   DG Chest Port 1 View  Result Date: 01/19/2021 CLINICAL DATA:  Abnormality on previous chest x-ray in a 60 year old male. EXAM: PORTABLE CHEST 1 VIEW COMPARISON:  CT from January 13, 2021, chest x-ray January 18, 2021. FINDINGS: RIGHT-sided Port-A-Cath terminates at the caval to atrial junction. Trachea is midline. Cardiomediastinal contours are stable. Interstitial and airspace opacities throughout the chest worse in the LEFT mid to upper chest, RIGHT upper chest lung bases bilaterally with similar appearance. On limited assessment there is no acute skeletal process. EKG leads project  over the chest. IMPRESSION: No interval change in the appearance of the chest since previous imaging with continued decreased lung volumes and patchy bilateral opacities. Electronically Signed   By: Zetta Bills M.D.   On: 01/19/2021 07:59   DG Chest Port 1 View  Result Date: 01/18/2021 CLINICAL DATA:  Respiratory distress EXAM: PORTABLE CHEST 1 VIEW COMPARISON:  01/17/2021 FINDINGS: Cardiac shadow is stable. Right-sided chest wall port is again seen. Lungs are hypoinflated. Patchy airspace opacities again identified in both lungs left greater than right but stable from the prior exam. IMPRESSION: Patchy airspace opacities bilaterally stable from the prior exam. Electronically Signed   By: Inez Catalina M.D.   On: 01/18/2021 17:24   DG Chest Port 1 View  Result Date: 01/17/2021 CLINICAL DATA:  Worsening shortness of breath. EXAM: PORTABLE CHEST 1 VIEW COMPARISON:  01/14/2021 FINDINGS: 0826 hours. Low volume film. Patchy airspace disease again noted in both lungs, left greater than right. Cardiopericardial silhouette is at upper limits of normal for size. Right Port-A-Cath again noted. Telemetry leads overlie the chest. IMPRESSION: Low volume film with patchy bilateral airspace disease, left greater than right. No substantial change. Electronically Signed   By: Misty Stanley M.D.   On: 01/17/2021 08:53   DG Chest Port 1 View  Result Date: 01/14/2021 CLINICAL DATA:  Shortness of breath.  History of cancer.  Diabetes. EXAM: PORTABLE CHEST 1 VIEW.  Patient is rotated. COMPARISON:  Chest x-ray 01/13/2021, CT chest 01/13/2021 FINDINGS: Accessed right chest wall Port-A-Cath in stable position. The heart and mediastinal contours are within normal limits. Low lung volumes with persistent diffuse patchy airspace opacities. No pleural effusion. No  pneumothorax. No acute osseous abnormality. Right upper quadrant surgical clips. IMPRESSION: Low lung volumes with persistent diffuse patchy airspace opacities.  Electronically Signed   By: Iven Finn M.D.   On: 01/14/2021 21:08   DG Chest Port 1 View  Result Date: 01/13/2021 CLINICAL DATA:  Shortness of breath EXAM: PORTABLE CHEST 1 VIEW COMPARISON:  Chest radiograph 07/29/2019 FINDINGS: There is a right chest wall port with the tip terminating in the lower SVC/cavoatrial junction. The cardiomediastinal silhouette is within normal limits. Lung volumes are low with unchanged asymmetric elevation of the right hemidiaphragm. There are increased interstitial markings with diffuse reticular opacities throughout both lungs. There is no focal consolidation. There is no significant pleural effusion. There is no pneumothorax. There is no acute osseous abnormality. There is gaseous distention of the stomach and large bowel in the right upper quadrant. IMPRESSION: Low lung volumes with suspected mild pulmonary interstitial edema. Atypical/viral infection could have a similar appearance. Electronically Signed   By: Valetta Mole M.D.   On: 01/13/2021 14:24           ASSESSMENT/PLAN   Acute hypoxemic respiratory failure Due to acute exacerbation of interstitial lung disease with pulmonary fibrosis - present on admission  - COVID19 negative   - supplemental O2 during my evaluation 10l/min>>8L>>HFNC  -Respiratory viral panel-negative  -serum fungitell-negative  -legionella ab-negative  -nasal MRSA PCR-negative -Procalcitonin trend reviewed -strep pneumoniae ur AG -Histoplasma Ur Ag-negative -sputum resp cultures -AFB sputum expectorated specimen -sputum cytology  -completed  Cefepime course for cap -Reduced Solumedrol to 40 BID - 01/15/21>>50 BID>>40 BID>>30 BID>>20 BID>>pred 50>>soluemdrol 40 iv bid please watch surgars -reviewed pertinent imaging with patient today - ESR and CRP are elevated but improving -PT/OT for d/c planning  -please encourage patient to use incentive spirometer few times each hour while hospitalized.   -continue Cellcept 250  bid >>500 bid >>750BID -UOP is adequate -Discussed goal of care - recommend DNR patient is in agreement but still discussing with wife, palliative care service to follow  -lasix 20 daily PO     Thank you for allowing me to participate in the care of this patient.   Patient/Family are satisfied with care plan and all questions have been answered.   This document was prepared using Dragon voice recognition software and may include unintentional dictation errors.     Ottie Glazier, M.D.  Division of [facility]

## 2021-02-11 DIAGNOSIS — J9621 Acute and chronic respiratory failure with hypoxia: Secondary | ICD-10-CM | POA: Diagnosis not present

## 2021-02-11 DIAGNOSIS — J849 Interstitial pulmonary disease, unspecified: Secondary | ICD-10-CM | POA: Diagnosis not present

## 2021-02-11 LAB — GLUCOSE, CAPILLARY
Glucose-Capillary: 269 mg/dL — ABNORMAL HIGH (ref 70–99)
Glucose-Capillary: 248 mg/dL — ABNORMAL HIGH (ref 70–99)
Glucose-Capillary: 191 mg/dL — ABNORMAL HIGH (ref 70–99)
Glucose-Capillary: 297 mg/dL — ABNORMAL HIGH (ref 70–99)

## 2021-02-11 LAB — SEDIMENTATION RATE: Sed Rate: 36 mm/hr — ABNORMAL HIGH (ref 0–20)

## 2021-02-11 LAB — C-REACTIVE PROTEIN: CRP: 0.7 mg/dL (ref <1.0)

## 2021-02-11 MED ORDER — INSULIN GLARGINE-YFGN 100 UNIT/ML ~~LOC~~ SOLN
16.0000 [IU] | Freq: Two times a day (BID) | SUBCUTANEOUS | Status: DC
  Administered 2021-02-11 – 2021-02-14 (×6): 16 [IU] via SUBCUTANEOUS
  Filled 2021-02-11 (×7): qty 0.16

## 2021-02-11 MED ORDER — IPRATROPIUM-ALBUTEROL 0.5-2.5 (3) MG/3ML IN SOLN
3.0000 mL | Freq: Three times a day (TID) | RESPIRATORY_TRACT | Status: DC
  Administered 2021-02-11 – 2021-02-19 (×24): 3 mL via RESPIRATORY_TRACT
  Filled 2021-02-11 (×24): qty 3

## 2021-02-11 NOTE — Plan of Care (Signed)
  Problem: Health Behavior/Discharge Planning: Goal: Ability to manage health-related needs will improve Outcome: Progressing   Problem: Clinical Measurements: Goal: Ability to maintain clinical measurements within normal limits will improve Outcome: Progressing   Problem: Clinical Measurements: Goal: Respiratory complications will improve Outcome: Progressing   Problem: Elimination: Goal: Will not experience complications related to bowel motility Outcome: Progressing   Problem: Elimination: Goal: Will not experience complications related to urinary retention Outcome: Progressing

## 2021-02-11 NOTE — Progress Notes (Signed)
Pulmonary Medicine          Date: 02/11/2021,   MRN# [id number] Edward Atkinson 1960-06-21     AdmissionWeight: 63.5 kg                 CurrentWeight: 71.6 kg   Referring physician: Dr Roderic Atkinson   CHIEF COMPLAINT:   Acute on chronic hypoxemic respiratory failure   HISTORY OF PRESENT ILLNESS   60 yo M w/hx of chronic hypoxemia and ILD post COVID19, T2DM, PE on eliquis, Lymphoma s/p Rituxan which is completed.  Came in due to worsening SOB and DOE specifically mild exertional defacation/urination with feelings of presyncope and severe dyspnea which started to get worse post tapering from steroid appx few days prior to onset of symptom progression.  He was placed on solumedrol and reported mild improvement.  He is on 10L /min [place] during my evaluation.  He had CT chest done with PE protocol and noted to have bilateral GGO infiltrates worse compared to June study. PCCM consultation for further evaluation and management.     01/17/21-  patient seems to be slighly clinically worse post reduction of IV steroids yesterday indicative of ongoing ILD exacerbation, thus far infectious workup is negative but inflammatory biomarkers are highly elevated also pointing towards ILD exacerbation.  There is new CXR in process this morning for interval changes. We discussed increasing steroids slightly and giving more time.  I will also initiate steroid sparing immunomodulation with BID cellcept as patient will likely need therapy for prolonged time period.  Compensatory tachycardia noted appreciate cardiology team.  Sugars with peaks and valleys with adjusted steroid therapy.  Patient with muscle wasting and protein calorie malnutrition but eating better this am.  01/18/21- patient is on HFNC at 60%, feels better, has not been able to tolerate BIPAP.  Wife is at bedside.  BP is borderline low MAP 76.  He has negative infectious workup and thus far ILD exacerbation seems to be main differential. He  continues to have peaks and valleys in blood glucose.  We started steroid sparing regimen with cellcept bid 250 mg and will increase dose as we taper off prednisone.  Remains tachyarrtythmic but improved. Appreciate everyone involved.     01/19/21- patient seen and examined at bedside.  Wife present during evaluation.  Ow HFNC weaned to 41%/45L/MIN.  CRP is still up, have increased cellcept and reducing solumedrol.  Patient felt stronger physically and was able to get OOB to chair. Cough is less now.  He is non-labored with breathing no accessory mm use today. He had dark stools with reduced hb, FOBT ordred.  Haptoglobin is elevated. Pathology slide review ordered.   01/20/21- patient is essentially unchanged from yesterday from pulmonary perspective. CXR also unchanged,  bloodwork with reduction in CRP.  Plan to wean O2 and continue OOB with PT/OT,  Continue current steroid dose and Cellcept.   01/21/21- patient is stable on HFNC.  He had small set back with increased O2 req after exerting himself in bathroom.  HFNC from 41/45 to 54/45.   He diuresed very well with lasix 20 bid >2L urine in past 24h.  Tachycardia is improved. He does not feel weaker and infact states he feels slowly improved.   01/22/21- I spoke to patient regarding goals of care and recommend DNR.  He is agreeable but I asked him to discuss with wife and family out of respect and we will revisit tommorow.  He states he is feeling better  and CRP is trending down but he remains on HFNC 60/40.  He was able to get OOB today and walked to bathroom but experienced mild desaturation with quick recovery "not much issues".      01/23/21- patient is still on high flow nasal canula , he is on 56%/45L.  We reviewed code status and he is still in discussion with family regarding DNR.   We discussed lung transplant per family request and this may be a future option if he is strong enough and recovers.  I have increased insluin TID to 20 from 15 and  increased bactrim to bid SS   01/24/21- patient weaned on HFNC to 49/45, he was able to use BIPAP overnight thinks its helping.  CXR this AM with interval improvement. Will keep on current dose of meds.  Repeat CRP in am.  Last BM yesterday well formed no blood, eating good, urine output very good on 20 bid lasix. Getting OOB with  transient desaturation.  Overall slight improvement.    01/25/21- patient has weaned down to 35%/40L/min.  He remains full code. Reviewed medical plan with Dr Jimmye Norman today.  Reduced steroids from 50 BID to 40 BID and also reduced insulin slightly.    01/26/21- patient is further weaned to 15L humidified HF nasal canula.  He did PT ROM exercises with mild desaturation. CXR today with expiratory image unable to see much interval changes. Patient reports improvement clinically. Plan to continue current regimen. Sugars slightly high due to steroids, will wean steroids soon. Continue cellcept as current 500 bid  01/27/21- patient is relatively same as yesterday, his O2 is weaned down to 14L.  He remains on IV solumedrol at 50 bid, we did discuss weaning this further to 40 BID. Will keep cellcept at current dose 500 bid.  His ankles have been down in seated position and are edematous on examination. He continues to participate with PT and feels good subjectively. We discussed starting dose of lasix for pedal edema.    01/28/21- patient examined at bedside, s/p PT/OT session with slight dyspnea.  He was weaned to 10L O2 but then went back up to 13 post PT due to increased WOB.  He has complete resolution of LE edema and appears to be euvolemic at this point. We discussed reducing soluemdrol today to 20 bid.  I reviewed plan with Dr Leslye Peer today.  01/29/21- patient examined at bedside.  He had a good day and feels better. We reviewed medical plan and are reducing steroids to PO prednisone starting at 1m daily.   01/30/21-  Patient seen and examined at bedside.  CXR this am with  worsening pulmonary edema, included below.  Vitals stable with mild tachypnea and tachycardia with activity. Will deliver 1 dose IV lasix 20 today. Plan to continue Pred PO and cellcept at 500 bid. Pulmonary intensive PT with IS and will initiate Metaneb therapy with albuterol. Blood work today will be repeated CRP CMP phos procal ESR.  Patient is agreeable to BIPAP QHS and Metaneb BID.   01/31/21-  patient had set back yesterday with mild flash pulm edema on CXR. He diuresed very well overnight and feels back to where he was at previously.  He had transient reactive leukocytosis as well.  We initiated empirically zosyn and zithromax on top of bid bactrim.  This probably could be dcd since he recovered so quickly which would not occur with pneumonia.  Ill go head and dc this today but continue PO bactrim.  Will stay on pred 50 today with cellcept at 500 bid as previous.  His O2 has been reduced from 95 to 70% overnight and hes working with PT today. Hopefully we can wean this back down to 10L as he was previosly then back to his home setting.   02/01/21- patient is somewhat better today, he is being weaned on O2 and is now down to 40%. He had BM today. Leaving prednisone at current dose and cxr today.   02/02/21- patient is now on 15L HF bubbler.  He reports feeling better.  He had 3 bowel movements after stool softner. He is working with PT/OT.  His long term prognosis is poor unfortunately and we appreciate collaboration with Palliative care team.   02/03/21- patient had over exerted himself again and required increased O2. He states he feels better and just did more then he should have with PT.  His wbc count is trending down. His CRP is trending down.  I have started morhpine for air hunger and pain at sacrum for skin breakdown and pressure ulceration.     02/04/21- patient is unchanged.  He wants to keep fighting to improve.  CRP is trending down again.  ESR is upward trending. He was unable to perform  adequately with PT and had severe dyspnea and desaturation just to get OOB to chair. Discussed case with [employer] to see if he can have HFNC at home but the device has been recalled. Discussed patient with Dr Roosevelt Locks plan is for additional diuresis.  I have increased cellcept to 750 BID today  02/05/21- patient with UOP >2L yesterday, his spO2 req remains HFNC 35/40.  He feels well and wants to keep working to be discharged to rehab. We have ordered NIV for him.  Patient has pulmonary fibrosis with restrictive lung disease and would benefit from device such as Trilogy 100/200 with O2 bleed in.  His overall prognosis is poor.   02/06/21- patient is back up to 50%/45L.  He clinically feels well but just not able to get off the oxygen or improve as well as we had hoped.  I had lengthy discussion with patient he wishes to continue current inpatient therapy and wants to be more aggressive medically.  I have increased steroids to solumedrol 40 bid again.    02/07/21- patient is unchanged, he states he feels well.  Sitting up in chair speaking infull sentences but still rquiring HFNC at 50%/45L.  Today will perform HRCT to evaluate details of pulmonary parenchyma.   02/08/21- patient s/p HRCT with bilateral pulmonary fibrosis and bronchiectasis.  We discussed [employer] VEST therapy.  Reviewed with oncology will plan to remove chest port to allow patient to use vest therapy.  He is working with PT to wean O2.   02/09/21- patient weaned to 9L/min.  For port removal today.   02/10/21- patient weaned to 6L min.  Reviewed care plan with patient and attending physician   02/11/21- patient stable overnight.  On 6L/min [place].  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      PAST MEDICAL HISTORY   Past Medical History:  Diagnosis Date  . Chronic respiratory failure with hypoxia (HCC)    2L  continuous  . COVID-19   . Diabetes mellitus without complication ([place])   . Diverticulitis    . Follicular lymphoma of intra-abdominal lymph nodes ([place]) 02/15/2019  . ILD (interstitial lung disease) ([place])      SURGICAL HISTORY   Past Surgical History:  Procedure Laterality Date  . CHOLECYSTECTOMY    . COLON RESECTION     DUE TO DIVERTICULITIS  . FLEXIBLE BRONCHOSCOPY N/A 12/02/2019   Procedure: FLEXIBLE BRONCHOSCOPY;  Surgeon: Ottie Glazier, MD;  Location: [facility];  Service: Thoracic;  Laterality: N/A;  . PORTA CATH INSERTION N/A 03/11/2019   Procedure: PORTA CATH INSERTION;  Surgeon: Algernon Huxley, MD;  Location: [place] [facility];  Service: Cardiovascular;  Laterality: N/A;  . PORTA CATH REMOVAL Right 02/09/2021   Procedure: PORTA CATH REMOVAL;  Surgeon: Katha Cabal, MD;  Location: [place] [facility];  Service: Cardiovascular;  Laterality: Right;  . PULMONARY THROMBECTOMY N/A 01/29/2019   Procedure: PULMONARY THROMBECTOMY;  Surgeon: Algernon Huxley, MD;  Location: [place] [facility];  Service: Cardiovascular;  Laterality: N/A;     FAMILY HISTORY   Family History  Problem Relation Age of Onset  . Diabetes Brother      SOCIAL HISTORY   Social History   Tobacco Use  . Smoking status: Never  . Smokeless tobacco: Never  Vaping Use  . Vaping Use: Never used  Substance Use Topics  . Alcohol use: Yes    Alcohol/week: 0.0 - 2.0 standard drinks    Comment: occasional  . Drug use: Never     MEDICATIONS    Home Medication:    Current Medication:  Current Facility-Administered Medications:  .  acetaminophen (TYLENOL) tablet 650 mg, 650 mg, Oral, Q6H PRN **OR** acetaminophen (TYLENOL) suppository 650 mg, 650 mg, Rectal, Q6H PRN, Oswald Hillock, RPH .  ALPRAZolam (XANAX) tablet 0.25 mg, 0.25 mg, Oral, QHS PRN, Sharen Hones, MD .  ALPRAZolam Duanne Moron) tablet 0.25 mg, 0.25 mg, Oral, q morning, Sharen Hones, MD, 0.25 mg at 02/10/21 0902 .  apixaban (ELIQUIS) tablet 5 mg, 5 mg, Oral, BID, Beers, Shanon Brow, RPH, 5 mg at 02/10/21 2202 .  Chlorhexidine  Gluconate Cloth 2 % PADS 6 each, 6 each, Topical, Daily, Kathie Dike, MD, 6 each at 02/09/21 0828 .  digoxin (LANOXIN) tablet 0.125 mg, 0.125 mg, Oral, Daily, Memon, Jehanzeb, MD, 0.125 mg at 02/10/21 0902 .  diltiazem (CARDIZEM CD) 24 hr capsule 120 mg, 120 mg, Oral, Daily, Davion Flannery, MD, 120 mg at 02/10/21 0902 .  diltiazem (CARDIZEM) injection 10 mg, 10 mg, Intravenous, Q6H PRN, Wyvonnia Dusky, MD, 10 mg at 01/24/21 2328 .  docusate sodium (COLACE) capsule 200 mg, 200 mg, Oral, BID, Wyvonnia Dusky, MD, 200 mg at 02/10/21 2202 .  feeding supplement (NEPRO CARB STEADY) liquid 237 mL, 237 mL, Oral, TID BM, Memon, Jehanzeb, MD, Last Rate: 0 mL/hr at 01/21/21 2130, 237 mL at 02/10/21 0904 .  furosemide (LASIX) injection 20 mg, 20 mg, Intravenous, Daily, Lanney Gins, Rani Sisney, MD, 20 mg at 02/10/21 0905 .  guaiFENesin (MUCINEX) 12 hr tablet 1,200 mg, 1,200 mg, Oral, BID, Kathie Dike, MD, 1,200 mg at 02/10/21 2202 .  guaiFENesin-dextromethorphan (ROBITUSSIN DM) 100-10 MG/5ML syrup 5 mL, 5 mL, Oral, Q4H PRN, Kathie Dike, MD, 5 mL at 02/04/21 0850 .  HYDROmorphone (DILAUDID) injection 1 mg, 1 mg, Intravenous, Once PRN, Stegmayer, Kimberly A, PA-C .  insulin aspart (novoLOG) injection 0-15 Units, 0-15 Units, Subcutaneous, TID WC, Sharen Hones, MD, 15 Units at 02/10/21 1144 .  insulin aspart (novoLOG) injection 0-5 Units, 0-5 Units, Subcutaneous, QHS, Sharen Hones, MD, 4 Units at 02/09/21 2156 .  insulin aspart (novoLOG) injection 10 Units, 10 Units, Subcutaneous, TID WC, Loletha Grayer, MD, 10 Units at 02/10/21 1726 .  insulin  glargine-yfgn [facility]) injection 14 Units, 14 Units, Subcutaneous, BID, Sharen Hones, MD, 14 Units at 02/10/21 2201 .  ipratropium-albuterol (DUONEB) 0.5-2.5 (3) MG/3ML nebulizer solution 3 mL, 3 mL, Nebulization, Q6H, Sharen Hones, MD, 3 mL at 02/11/21 0134 .  lactulose (CHRONULAC) 10 GM/15ML solution 30 g, 30 g, Oral, BID PRN, Leslye Peer, Richard, MD .   levalbuterol [facility]) nebulizer solution 1.25 mg, 1.25 mg, Nebulization, Q4H PRN, Howerter, Justin B, DO, 1.25 mg at 01/17/21 2011 .  methylPREDNISolone sodium succinate (SOLU-MEDROL) 40 mg/mL injection 40 mg, 40 mg, Intravenous, Q12H, Lanney Gins, Lashaun Poch, MD, 40 mg at 02/11/21 0235 .  morphine 2 MG/ML injection 1 mg, 1 mg, Intravenous, Q3H PRN, Ottie Glazier, MD, 1 mg at 02/10/21 1212 .  multivitamin with minerals tablet 1 tablet, 1 tablet, Oral, Daily, Kathie Dike, MD, 1 tablet at 02/10/21 1327 .  mycophenolate (CELLCEPT) capsule 750 mg, 750 mg, Oral, BID, Lanney Gins, Reynold Mantell, MD, 750 mg at 02/10/21 2201 .  ondansetron (ZOFRAN) injection 4 mg, 4 mg, Intravenous, Q6H PRN, Stegmayer, Kimberly A, PA-C .  polyethylene glycol (MIRALAX / GLYCOLAX) packet 17 g, 17 g, Oral, Daily, Leslye Peer, Richard, MD, 17 g at 02/10/21 0902 .  simethicone (MYLICON) chewable tablet 80 mg, 80 mg, Oral, QID PRN, Loletha Grayer, MD, 80 mg at 01/29/21 1411 .  sodium chloride (OCEAN) 0.65 % nasal spray 1 spray, 1 spray, Each Nare, PRN, Loletha Grayer, MD, 1 spray at 01/30/21 2142 .  sodium chloride flush (NS) 0.9 % injection 10-40 mL, 10-40 mL, Intracatheter, Q12H, Memon, Jolaine Artist, MD, 10 mL at 02/10/21 2202 .  sodium chloride flush (NS) 0.9 % injection 10-40 mL, 10-40 mL, Intracatheter, PRN, Kathie Dike, MD, 10 mL at 01/24/21 0915 .  sulfamethoxazole-trimethoprim (BACTRIM) 400-80 MG per tablet 1 tablet, 1 tablet, Oral, Q12H, Ottie Glazier, MD, 1 tablet at 02/10/21 2202 .  traZODone (DESYREL) tablet 50 mg, 50 mg, Oral, QHS PRN, Sharion Settler, NP, 50 mg at 02/10/21 2202    ALLERGIES   Penicillins     REVIEW OF SYSTEMS    Review of Systems:  Gen:  Denies  fever, sweats, chills weigh loss  HEENT: Denies blurred vision, double vision, ear pain, eye pain, hearing loss, nose bleeds, sore throat Cardiac:  No dizziness, chest pain or heaviness, chest tightness,edema Resp:  admits to severe dyspnea Gi: Denies  swallowing difficulty, stomach pain, nausea or vomiting, diarrhea, constipation, bowel incontinence Gu:  Denies bladder incontinence, burning urine Ext:   Denies Joint pain, stiffness or swelling Skin: Denies  skin rash, easy bruising or bleeding or hives Endoc:  Denies polyuria, polydipsia , polyphagia or weight change Psych:   admits to axniety  Other:  All other systems negative   VS: BP 123/80 (BP Location: Right Arm)   Pulse 86   Temp 98.3 F (36.8 C) (Oral)   Resp 18   Ht 6' (1.829 m)   Wt 71.6 kg   SpO2 100%   BMI 21.41 kg/m      PHYSICAL EXAM    GENERAL:NAD, no fevers, chills, no weakness no fatigue HEAD: Normocephalic, atraumatic.  EYES: Pupils equal, round, reactive to light. Extraocular muscles intact. No scleral icterus.  MOUTH: Moist mucosal membrane. Dentition intact. No abscess noted.  EAR, NOSE, THROAT: Clear without exudates. No external lesions.  NECK: Supple. No thyromegaly. No nodules. No JVD.  PULMONARY: Bilateral rhonchi improved from previous CARDIOVASCULAR: S1 and S2. Regular rate and rhythm. No murmurs, rubs, or gallops. No edema. Pedal pulses 2+ bilaterally.  GASTROINTESTINAL: Soft, nontender,  nondistended. No masses. Positive bowel sounds. No hepatosplenomegaly.  MUSCULOSKELETAL: No swelling, clubbing, or edema. Range of motion full in all extremities.  NEUROLOGIC: Cranial nerves II through XII are intact. No gross focal neurological deficits. Sensation intact. Reflexes intact.  SKIN: No ulceration, lesions, rashes, or cyanosis. Skin warm and dry. Turgor intact.  PSYCHIATRIC: Mood, affect within normal limits. The patient is awake, alert and oriented x 3. Insight, judgment intact.       IMAGING    CT Angio Chest PE W/Cm &/Or Wo Cm  Result Date: 01/13/2021 CLINICAL DATA:  Respiratory distress, hypoxia EXAM: CT ANGIOGRAPHY CHEST WITH CONTRAST TECHNIQUE: Multidetector CT imaging of the chest was performed using the standard protocol during bolus  administration of intravenous contrast. Multiplanar CT image reconstructions and MIPs were obtained to evaluate the vascular anatomy. CONTRAST:  76m OMNIPAQUE IOHEXOL 350 MG/ML SOLN COMPARISON:  10/26/2020, 01/13/2021 FINDINGS: Cardiovascular: This is a technically adequate evaluation of the pulmonary vasculature. No filling defects or pulmonary emboli. The heart is unremarkable without pericardial effusion. No evidence of thoracic aortic aneurysm or dissection. Right chest wall port via internal jugular approach tip within the superior vena cava. Mediastinum/Nodes: No pathologic adenopathy within the mediastinum, hila, or axilla. Thyroid, trachea, and esophagus are unremarkable. Lungs/Pleura: There has been interval progression of the multifocal ground-glass airspace disease seen previously. Changes are most pronounced within the dependent lower lobes. No effusion or pneumothorax. The central airways are patent, with persistent bronchiectasis. Upper Abdomen: No acute abnormality. Musculoskeletal: No acute or destructive bony lesions. Reconstructed images demonstrate no additional findings. Review of the MIP images confirms the above findings. IMPRESSION: 1. No evidence of pulmonary embolus. 2. Progressive multifocal bilateral ground-glass airspace disease, greatest at the lung bases. The differential diagnosis would include progressive postinflammatory scarring and fibrosis, multifocal atypical pneumonia, or hypersensitivity/drug toxicity. 3. Stable bronchiectasis. Electronically Signed   By: MRanda NgoM.D.   On: 01/13/2021 17:13   CT Chest High Resolution  Result Date: 02/07/2021 CLINICAL DATA:  Inpatient. Interstitial lung disease. Worsening dyspnea. Chronic hypoxemia post COVID-19. History of lymphoma treated with Rituxan. EXAM: CT CHEST WITHOUT CONTRAST TECHNIQUE: Multidetector CT imaging of the chest was performed following the standard protocol without intravenous contrast. High resolution imaging  of the lungs, as well as inspiratory and expiratory imaging, was performed. COMPARISON:  02/05/2021 chest radiograph. 01/13/2021 chest CT angiogram. FINDINGS: Cardiovascular: Normal heart size. No significant pericardial effusion/thickening. Right internal jugular central venous catheter terminates at the cavoatrial junction. Normal course and caliber of the thoracic aorta. Stable dilated main pulmonary artery (3.7 cm diameter). Mediastinum/Nodes: No discrete thyroid nodules. Unremarkable esophagus. No pathologically enlarged axillary, mediastinal or hilar lymph nodes, noting limited sensitivity for the detection of hilar adenopathy on this noncontrast study. Lungs/Pleura: No pneumothorax. No pleural effusion. Severe patchy confluent peribronchovascular and peripheral ground-glass opacity, reticulation, traction bronchiectasis, architectural distortion and volume loss in both lungs with a basilar predominance. The ground-glass opacities have mildly improved since 01/13/2021 chest CT. The bronchiectasis, distortion and volume loss appear slightly worsened. No significant lobular air trapping or evidence of tracheobronchomalacia on the expiration sequence. No acute consolidative airspace disease, lung masses or significant pulmonary nodules. No frank honeycombing. Upper abdomen: Chronic mild elevation of the right hemidiaphragm. Musculoskeletal: No aggressive appearing focal osseous lesions. Mild thoracic spondylosis. IMPRESSION: 1. Spectrum of findings compatible with severe basilar predominant fibrotic interstitial lung disease with ground-glass and no frank honeycombing predominance. Ground-glass opacities have slightly improved and bronchiectasis and other findings of fibrosis have slightly worsened since  recent 01/13/2021 chest CT angiogram study. Findings are most compatible with a severe rapidly progressive postinflammatory fibrosis or fibrotic NSIP pattern. Findings are suggestive of an alternative diagnosis  (not UIP) per consensus guidelines: Diagnosis of Idiopathic Pulmonary Fibrosis: An Official ATS/ERS/JRS/ALAT Clinical Practice Guideline. [place], Iss 5, [id number], Dec 31 2016. 2. Stable dilated main pulmonary artery, suggesting chronic pulmonary arterial hypertension. 3. Chronic mild elevation of the right hemidiaphragm. Electronically Signed   By: Ilona Sorrel M.D.   On: 02/07/2021 17:29   PERIPHERAL VASCULAR CATHETERIZATION  Result Date: 02/09/2021 See surgical note for result.  DG Chest Port 1 View  Result Date: 02/05/2021 CLINICAL DATA:  Bilateral pulmonary infiltrates on chest x-ray. EXAM: PORTABLE CHEST 1 VIEW COMPARISON:  Chest radiograph 02/01/2021 FINDINGS: Power injectable right IJ port central venous catheter tip projects at the level of the superior cavoatrial junction. Unchanged low lung volumes with lower lobe predominant interstitial opacities, left greater than right. No new focal consolidation, pleural effusion, or pneumothorax. Heart is normal in size. Surgical clips in the right upper abdomen. IMPRESSION: Bilateral interstitial airspace opacities are unchanged compared to 02/01/2021. No new focal consolidation, pleural effusion, or pneumothorax. Electronically Signed   By: Ileana Roup M.D.   On: 02/05/2021 10:59   DG Chest Port 1 View  Result Date: 02/01/2021 CLINICAL DATA:  Shortness of breath, edema EXAM: PORTABLE CHEST 1 VIEW COMPARISON:  01/30/2021 FINDINGS: Stable positioning of right IJ approach Port-A-Cath. Heart size within normal limits. Low lung volumes. Slight interval worsening of bilateral airspace opacities, left worse than right. No pleural effusion or pneumothorax. IMPRESSION: Slight interval worsening of bilateral airspace opacities, left worse than right. Electronically Signed   By: Davina Poke D.O.   On: 02/01/2021 14:37   DG Chest Port 1 View  Result Date: 01/30/2021 CLINICAL DATA:  Acute on chronic hypoxemic respiratory  failure. EXAM: PORTABLE CHEST 1 VIEW COMPARISON:  January 28, 2021 FINDINGS: No pneumothorax. Stable cardiomediastinal silhouette and right Port-A-Cath. Left-greater-than-right pulmonary opacities/infiltrates. No other interval changes. IMPRESSION: 1. Persistent low lung volumes and bilateral pulmonary infiltrates. No interval changes. Electronically Signed   By: Dorise Bullion III M.D.   On: 01/30/2021 08:36   DG Chest Port 1 View  Result Date: 01/28/2021 CLINICAL DATA:  Shortness of breath. EXAM: PORTABLE CHEST 1 VIEW COMPARISON:  Multiple recent chest films. FINDINGS: The right IJ power port is stable. Persistent low lung volumes and bilateral pulmonary infiltrates. No definite pleural effusion or pneumothorax. IMPRESSION: Persistent low lung volumes and bilateral pulmonary infiltrates. Electronically Signed   By: Marijo Sanes M.D.   On: 01/28/2021 16:03   DG Chest Port 1 View  Result Date: 01/26/2021 CLINICAL DATA:  Shortness of breath, abnormal chest x-ray EXAM: PORTABLE CHEST 1 VIEW COMPARISON:  01/24/2021 FINDINGS: Right Port-A-Cath remains in place, unchanged. Low lung volumes with bilateral airspace opacities, most pronounced in the left perihilar and lower lobe, unchanged since prior study. No visible effusions or pneumothorax. Heart is normal size. IMPRESSION: Low lung volumes with bilateral airspace disease, left-greater-than-right. No significant change. Electronically Signed   By: Rolm Baptise M.D.   On: 01/26/2021 09:42   DG Chest Port 1 View  Result Date: 01/24/2021 CLINICAL DATA:  Follow-up multifocal pneumonia. EXAM: PORTABLE CHEST 1 VIEW COMPARISON:  01/22/2021 and older exams. FINDINGS: Mild interval decrease in the airspace lung opacities, most evident on the left. Lung volumes remain low. No new lung abnormalities. No visualized pleural effusion.  No pneumothorax. Right  internal jugular Port-A-Cath is stable. IMPRESSION: 1. Mild interval improvement in lung aeration with  decreased airspace lung opacities, most notably on the left. Electronically Signed   By: Lajean Manes M.D.   On: 01/24/2021 07:57   DG Chest Port 1 View  Result Date: 01/22/2021 CLINICAL DATA:  Hypoxia, shortness of breath. EXAM: PORTABLE CHEST 1 VIEW COMPARISON:  January 20, 2021. FINDINGS: Stable cardiomediastinal silhouette. Right internal jugular Port-A-Cath is unchanged in position. Hypoinflation of the lungs is noted. Stable bilateral lung opacities are noted, left greater than right, most consistent with multifocal pneumonia. Bony thorax is unremarkable. IMPRESSION: Hypoinflation of the lungs. Stable bilateral lung opacities are noted, left greater than right, most consistent with multifocal pneumonia. Electronically Signed   By: Marijo Conception M.D.   On: 01/22/2021 19:26   DG Chest Port 1 View  Result Date: 01/20/2021 CLINICAL DATA:  Pneumonitis EXAM: PORTABLE CHEST 1 VIEW COMPARISON:  01/19/2021 FINDINGS: Shallow inspiration. Patchy infiltrates in the left lung and right lung base, similar to prior study and likely multifocal pneumonia. No pleural effusions. No pneumothorax. Mediastinal contours appear intact. Heart size is normal. Power port type central venous catheter on the right with tip over the cavoatrial junction region. Surgical clips in the right upper quadrant. IMPRESSION: Shallow inspiration. Patchy infiltrates in both lungs, likely multifocal pneumonia, without change. Electronically Signed   By: Lucienne Capers M.D.   On: 01/20/2021 03:56   DG Chest Port 1 View  Result Date: 01/19/2021 CLINICAL DATA:  Abnormality on previous chest x-ray in a 60 year old male. EXAM: PORTABLE CHEST 1 VIEW COMPARISON:  CT from January 13, 2021, chest x-ray January 18, 2021. FINDINGS: RIGHT-sided Port-A-Cath terminates at the caval to atrial junction. Trachea is midline. Cardiomediastinal contours are stable. Interstitial and airspace opacities throughout the chest worse in the LEFT mid to  upper chest, RIGHT upper chest lung bases bilaterally with similar appearance. On limited assessment there is no acute skeletal process. EKG leads project over the chest. IMPRESSION: No interval change in the appearance of the chest since previous imaging with continued decreased lung volumes and patchy bilateral opacities. Electronically Signed   By: Zetta Bills M.D.   On: 01/19/2021 07:59   DG Chest Port 1 View  Result Date: 01/18/2021 CLINICAL DATA:  Respiratory distress EXAM: PORTABLE CHEST 1 VIEW COMPARISON:  01/17/2021 FINDINGS: Cardiac shadow is stable. Right-sided chest wall port is again seen. Lungs are hypoinflated. Patchy airspace opacities again identified in both lungs left greater than right but stable from the prior exam. IMPRESSION: Patchy airspace opacities bilaterally stable from the prior exam. Electronically Signed   By: Inez Catalina M.D.   On: 01/18/2021 17:24   DG Chest Port 1 View  Result Date: 01/17/2021 CLINICAL DATA:  Worsening shortness of breath. EXAM: PORTABLE CHEST 1 VIEW COMPARISON:  01/14/2021 FINDINGS: 0826 hours. Low volume film. Patchy airspace disease again noted in both lungs, left greater than right. Cardiopericardial silhouette is at upper limits of normal for size. Right Port-A-Cath again noted. Telemetry leads overlie the chest. IMPRESSION: Low volume film with patchy bilateral airspace disease, left greater than right. No substantial change. Electronically Signed   By: Misty Stanley M.D.   On: 01/17/2021 08:53   DG Chest Port 1 View  Result Date: 01/14/2021 CLINICAL DATA:  Shortness of breath.  History of cancer.  Diabetes. EXAM: PORTABLE CHEST 1 VIEW.  Patient is rotated. COMPARISON:  Chest x-ray 01/13/2021, CT chest 01/13/2021 FINDINGS: Accessed right chest wall Port-A-Cath in stable  position. The heart and mediastinal contours are within normal limits. Low lung volumes with persistent diffuse patchy airspace opacities. No pleural effusion. No pneumothorax.  No acute osseous abnormality. Right upper quadrant surgical clips. IMPRESSION: Low lung volumes with persistent diffuse patchy airspace opacities. Electronically Signed   By: Iven Finn M.D.   On: 01/14/2021 21:08   DG Chest Port 1 View  Result Date: 01/13/2021 CLINICAL DATA:  Shortness of breath EXAM: PORTABLE CHEST 1 VIEW COMPARISON:  Chest radiograph 07/29/2019 FINDINGS: There is a right chest wall port with the tip terminating in the lower SVC/cavoatrial junction. The cardiomediastinal silhouette is within normal limits. Lung volumes are low with unchanged asymmetric elevation of the right hemidiaphragm. There are increased interstitial markings with diffuse reticular opacities throughout both lungs. There is no focal consolidation. There is no significant pleural effusion. There is no pneumothorax. There is no acute osseous abnormality. There is gaseous distention of the stomach and large bowel in the right upper quadrant. IMPRESSION: Low lung volumes with suspected mild pulmonary interstitial edema. Atypical/viral infection could have a similar appearance. Electronically Signed   By: Valetta Mole M.D.   On: 01/13/2021 14:24           ASSESSMENT/PLAN   Acute on hypoxemic respiratory failure Due to acute exacerbation of Chronic lung disease/ILD with Critical illness Myopathy associated respiratory failure - present on admission  - COVID19 negative   - supplemental O2 during my evaluation 10l/min>>8L>>HFNC 7L -Respiratory viral panel-negative  -serum fungitell-negative  -legionella ab-negative  -nasal MRSA PCR-negative -Procalcitonin trend reviewed -strep pneumoniae ur AG -Histoplasma Ur Ag-negative -sputum resp cultures -AFB sputum expectorated specimen -sputum cytology  -completed  Cefepime course for cap -Reduced Solumedrol to 40 BID - 01/15/21>>50 BID>>40 BID>>30 BID>>20 BID>>pred 50>>soluemdrol 40 iv bid please watch surgars -reviewed pertinent imaging with patient  today - ESR and CRP are elevated but improving -PT/OT for d/c planning  -please encourage patient to use incentive spirometer few times each hour while hospitalized.   -continue Cellcept 250 bid >>500 bid >>750BID -UOP is adequate -Discussed goal of care - recommend DNR patient is in agreement but still discussing with wife, palliative care service to follow  -lasix 20 daily PO     Thank you for allowing me to participate in the care of this patient.   Patient/Family are satisfied with care plan and all questions have been answered.   This document was prepared using Dragon voice recognition software and may include unintentional dictation errors.     Ottie Glazier, M.D.  Div[facility] of [facility]

## 2021-02-11 NOTE — Progress Notes (Signed)
Progress Note    Edward Atkinson  RUE:[id number] DOB: 01/18/1961  DOA: 01/13/2021 PCP: Rusty Aus, MD      Brief Narrative:    Medical records reviewed and are as summarized below:  Edward Atkinson is a 60 y.o. male with history of follicular lymphoma, [id number] infection, interstitial lung disease and chronic respiratory failure, and type 2 diabetes mellitus.       Assessment/Plan:   Principal Problem:   Acute on chronic respiratory failure with hypoxia (HCC) Active Problems:   Type 2 diabetes mellitus with hypoglycemia without coma, without long-term current use of insulin (HCC)   Follicular lymphoma (HCC)   SOB (shortness of breath)   Interstitial lung disease (HCC)   Severe sepsis (HCC)   CAP (community acquired pneumonia)   Elevated troponin   Protein calorie malnutrition (HCC)   Protein-calorie malnutrition, severe   AF (paroxysmal atrial fibrillation) (HCC)   Thrombocytosis   Leukocytosis   Pressure injury of skin   Hyponatremia   Uncontrolled type 2 diabetes mellitus with hyperglycemia (HCC)   Nutrition Problem: Severe Malnutrition Etiology: chronic illness (lymphoma, ILD)  Signs/Symptoms: severe fat depletion, severe muscle depletion, percent weight loss Percent weight loss: 16 %   Body mass index is 21.41 kg/m.  Acute on chronic hypoxemic respiratory failure, exacerbation of interstitial lung disease, bronchiectasis: Oxygen requirement is down to 5 L/min via nasal cannula.  He is on 3 L/min oxygen at baseline.  Taper down oxygen as able.  Continue IV steroids for exacerbation of ILD.   Follow-up with pulmonologist.  Type II DM with severe hyperglycemia: Steroids is contributing to severe hyperglycemia.  Increase insulin glargine from 14 units to 16 units twice daily.  Continue NovoLog.  Paroxysmal atrial fibrillation: Continue Eliquis  Follicular lymphoma: Completed chemotherapy.  S/p removal of Port-A-Cath on 02/09/2021     Diet Order              Diet Carb Modified Fluid consistency: Thin; Room service appropriate? Yes  Diet effective now                      Consultants: Pulmonologist Palliative care  Procedures: S/p removal of Port-A-Cath on 02/09/2021    Medications:    ALPRAZolam  0.25 mg Oral q morning   apixaban  5 mg Oral BID   Chlorhexidine Gluconate Cloth  6 each Topical Daily   digoxin  0.125 mg Oral Daily   diltiazem  120 mg Oral Daily   docusate sodium  200 mg Oral BID   feeding supplement (NEPRO CARB STEADY)  237 mL Oral TID BM   furosemide  20 mg Intravenous Daily   guaiFENesin  1,200 mg Oral BID   insulin aspart  0-15 Units Subcutaneous TID WC   insulin aspart  0-5 Units Subcutaneous QHS   insulin aspart  10 Units Subcutaneous TID WC   insulin glargine-yfgn  14 Units Subcutaneous BID   ipratropium-albuterol  3 mL Nebulization TID   methylPREDNISolone (SOLU-MEDROL) injection  40 mg Intravenous Q12H   multivitamin with minerals  1 tablet Oral Daily   mycophenolate  750 mg Oral BID   polyethylene glycol  17 g Oral Daily   sodium chloride flush  10-40 mL Intracatheter Q12H   sulfamethoxazole-trimethoprim  1 tablet Oral Q12H   Continuous Infusions:   Anti-infectives (From admission, onward)    Start     Dose/Rate Route Frequency Ordered Stop   01/31/21 1000  azithromycin (ZITHROMAX) tablet  250 mg  Status:  Discontinued       See Hyperspace for full Linked Orders Report.   250 mg Oral Daily 01/30/21 1532 01/31/21 1218   01/30/21 1630  azithromycin (ZITHROMAX) tablet 500 mg       See Hyperspace for full Linked Orders Report.   500 mg Oral Daily 01/30/21 1532 01/30/21 1617   01/30/21 1630  piperacillin-tazobactam (ZOSYN) IVPB 3.375 g  Status:  Discontinued        3.375 g 12.5 mL/hr over 240 Minutes Intravenous Every 8 hours 01/30/21 1540 01/31/21 1218   01/23/21 2200  sulfamethoxazole-trimethoprim (BACTRIM) 400-80 MG per tablet 1 tablet        1 tablet Oral Every 12 hours  01/23/21 1730     01/19/21 2000  sulfamethoxazole-trimethoprim (BACTRIM) 400-80 MG per tablet 1 tablet  Status:  Discontinued        1 tablet Oral Daily 01/19/21 1759 01/23/21 1730   01/15/21 2200  acyclovir (ZOVIRAX) tablet 400 mg  Status:  Discontinued        400 mg Oral 2 times daily 01/15/21 1558 01/15/21 1559   01/15/21 1645  acyclovir (ZOVIRAX) 200 MG capsule 400 mg  Status:  Discontinued        400 mg Oral 2 times daily 01/15/21 1558 02/02/21 1344   01/15/21 1515  acyclovir (ZOVIRAX) tablet 400 mg  Status:  Discontinued        400 mg Oral 2 times daily 01/15/21 1424 01/15/21 1556   01/14/21 1900  azithromycin (ZITHROMAX) 500 mg in sodium chloride 0.9 % 250 mL IVPB        500 mg 250 mL/hr over 60 Minutes Intravenous Every 24 hours 01/13/21 1835 01/20/21 2359   01/14/21 1900  cefTRIAXone (ROCEPHIN) 2 g in sodium chloride 0.9 % 100 mL IVPB  Status:  Discontinued        2 g 200 mL/hr over 30 Minutes Intravenous Every 24 hours 01/13/21 1835 01/13/21 2251   01/13/21 2345  ceFEPIme (MAXIPIME) 2 g in sodium chloride 0.9 % 100 mL IVPB  Status:  Discontinued        2 g 200 mL/hr over 30 Minutes Intravenous Every 8 hours 01/13/21 2309 01/19/21 2035   01/13/21 1800  azithromycin (ZITHROMAX) 500 mg in sodium chloride 0.9 % 250 mL IVPB        500 mg 250 mL/hr over 60 Minutes Intravenous  Once 01/13/21 1746 01/13/21 1958   01/13/21 1800  cefTRIAXone (ROCEPHIN) 1 g in sodium chloride 0.9 % 100 mL IVPB        1 g 200 mL/hr over 30 Minutes Intravenous  Once 01/13/21 1746 01/13/21 2054              Family Communication/Anticipated D/C date and plan/Code Status   DVT prophylaxis: SCDs Start: 01/13/21 1831 apixaban (ELIQUIS) tablet 5 mg     Code Status: Full Code  Family Communication: None Disposition Plan:    Status is: Inpatient  Remains inpatient appropriate because:Inpatient level of care appropriate due to severity of illness  Dispo: The patient is from: Home               Anticipated d/c is to: Home              Patient currently is not medically stable to d/c.   Difficult to place patient No           Subjective:   Interval events noted.  He complains of shortness of  breath with mild exertion.  He has been using the bedside commode because any attempt to go to the bathroom causes significant oxygen desaturation.  Objective:    Vitals:   02/11/21 0734 02/11/21 0752 02/11/21 0828 02/11/21 1136  BP: 123/80   123/78  Pulse: 86 93  99  Resp: _0 Temp: 98.3 F (36.8 C)   97.9 F (36.6 C)  TempSrc: Oral     SpO2: 100% 90% 93% 94%  Weight:      Height:       No data found.   Intake/Output Summary (Last 24 hours) at 02/11/2021 1425 Last data filed at 02/11/2021 1346 Gross per 24 hour  Intake 970 ml  Output 3350 ml  Net -2380 ml   Filed Weights   02/07/21 0430 02/09/21 0613 02/11/21 0412  Weight: 66.3 kg 67.9 kg 71.6 kg    Exam:  GEN: NAD SKIN: Warm and dry.  Stage II sacral decubitus ulcer EYES: No pallor or icterus ENT: MMM CV: RRR PULM: Bibasilar rales.  No wheezing heard ABD: soft, ND, NT, +BS CNS: AAO x 3, non focal EXT: No edema or tenderness     Data Reviewed:   I have personally reviewed following labs and imaging studies:  Labs: Labs show the following:   Basic Metabolic Panel: Recent Labs  Lab 02/09/21 0700 02/10/21 0534  NA 135 133*  K 4.5 4.5  CL 94* 93*  CO2 32 32  GLUCOSE 277* 384*  BUN 20 21  CREATININE 0.76 0.63  CALCIUM 9.1 8.7*  MG  --  2.0   GFR Estimated Creatinine Clearance: 97 mL/min (by C-G formula based on SCr of 0.63 mg/dL). Liver Function Tests: No results for input(s): AST, ALT, ALKPHOS, BILITOT, PROT, ALBUMIN in the last 168 hours. No results for input(s): LIPASE, AMYLASE in the last 168 hours. No results for input(s): AMMONIA in the last 168 hours. Coagulation profile No results for input(s): INR, PROTIME in the last 168 hours.  CBC: Recent Labs  Lab  02/10/21 0534  WBC 7.1  HGB 10.1*  HCT 29.7*  MCV 86.6  PLT 186   Cardiac Enzymes: No results for input(s): CKTOTAL, CKMB, CKMBINDEX, TROPONINI in the last 168 hours. BNP (last 3 results) No results for input(s): PROBNP in the last 8760 hours. CBG: Recent Labs  Lab 02/10/21 1112 02/10/21 1524 02/10/21 2153 02/11/21 0735 02/11/21 1137  GLUCAP 402* 92 139* 269* 248*   D-Dimer: No results for input(s): DDIMER in the last 72 hours. Hgb A1c: No results for input(s): HGBA1C in the last 72 hours. Lipid Profile: No results for input(s): CHOL, HDL, LDLCALC, TRIG, CHOLHDL, LDLDIRECT in the last 72 hours. Thyroid function studies: No results for input(s): TSH, T4TOTAL, T3FREE, THYROIDAB in the last 72 hours.  Invalid input(s): FREET3 Anemia work up: No results for input(s): VITAMINB12, FOLATE, FERRITIN, TIBC, IRON, RETICCTPCT in the last 72 hours. Sepsis Labs: Recent Labs  Lab 02/10/21 0534  WBC 7.1    Microbiology Recent Results (from the past 240 hour(s))  Culture, BAL-quantitative w Gram Stain     Status: None   Collection Time: 02/08/21  7:50 AM   Specimen: SPU; Respiratory  Result Value Ref Range Status   Specimen Description   Final    SPUTUM Performed at [facility], [address] [place], [place] [address]    Special Requests   Final    NONE Performed at [facility], [address], [place], [place] [address]  Gram Stain   Final    NO SQUAMOUS EPITHELIAL CELLS SEEN NO WBC SEEN FEW GRAM POSITIVE RODS FEW GRAM POSITIVE COCCI    Culture   Final    FEW Normal respiratory flora-no Staph aureus or Pseudomonas seen Performed at [facility], [address], [place], [place] [address]    Report Status 02/10/2021 FINAL  Final    Procedures and diagnostic studies:  PERIPHERAL VASCULAR CATHETERIZATION  Result Date: 02/09/2021 See surgical note for result.              LOS: 29 days   [place]  Copywriter, advertising on [email or web address]. If 7PM-7AM, please contact night-coverage at [email or web address]     02/11/2021, 2:25 PM

## 2021-02-11 NOTE — Progress Notes (Signed)
Edward Atkinson presents with critical illness myopathy.  Patients PFT shows restrictive ventilatory defect and FEV1 is 44.2% predicted with an FVC of 40% predicted. The use of the NIMV will treat patients ventilatory defect and can reduce risk of exacerbations and future hospitalizations when used at night and during the day. All alternate devices [phone] and [id number]) have been proven ineffective to provide essential volume control necessary to maintain acceptable CO2 levels along with maintaining an appropriate volume for the patients lungs. An NIV with AVAPS AE is necessary to prevent patient harm.  Interruption or failure to provide NIV would quickly lead to exacerbation of the patients condition, hospital admission, and likely harm to the patient. Continued use is preferred.  Patient is able to protect their airways and clear secretions on their own.  The patient has bronchiectasis, confirmed on a CT on 02/07/2021. Patient has had frequent exacerbations/chest infections requiring antibiotic therapy.  The patient has tried other standard treatments such as a PEP device flutter valve. However, the PEP device is insufficient in controlling the symptoms surrounding Edward Atkinson bronchiectasis .  Patient would benefit from [facility] (High Frequency Chest Wall Oscillation) Vest Therapy or an [place], [place]

## 2021-02-11 NOTE — Plan of Care (Signed)
  Problem: Health Behavior/Discharge Planning: Goal: Ability to manage health-related needs will improve Outcome: Progressing   Problem: Clinical Measurements: Goal: Ability to maintain clinical measurements within normal limits will improve Outcome: Progressing   Problem: Clinical Measurements: Goal: Respiratory complications will improve Outcome: Progressing   

## 2021-02-12 DIAGNOSIS — J849 Interstitial pulmonary disease, unspecified: Secondary | ICD-10-CM | POA: Diagnosis not present

## 2021-02-12 DIAGNOSIS — J9621 Acute and chronic respiratory failure with hypoxia: Secondary | ICD-10-CM | POA: Diagnosis not present

## 2021-02-12 LAB — C-REACTIVE PROTEIN
CRP: 0.8 mg/dL (ref <1.0)
CRP: 0.8 mg/dL (ref <1.0)

## 2021-02-12 LAB — GLUCOSE, CAPILLARY
Glucose-Capillary: 272 mg/dL — ABNORMAL HIGH (ref 70–99)
Glucose-Capillary: 298 mg/dL — ABNORMAL HIGH (ref 70–99)
Glucose-Capillary: 191 mg/dL — ABNORMAL HIGH (ref 70–99)
Glucose-Capillary: 95 mg/dL (ref 70–99)

## 2021-02-12 LAB — SEDIMENTATION RATE: Sed Rate: 36 mm/hr — ABNORMAL HIGH (ref 0–20)

## 2021-02-12 MED ORDER — DEXAMETHASONE 6 MG PO TABS
10.0000 mg | ORAL_TABLET | Freq: Two times a day (BID) | ORAL | Status: DC
  Administered 2021-02-12 – 2021-02-16 (×8): 10 mg via ORAL
  Filled 2021-02-12 (×9): qty 1

## 2021-02-12 MED ORDER — METHYLPREDNISOLONE SODIUM SUCC 40 MG IJ SOLR
10.0000 mg | Freq: Two times a day (BID) | INTRAMUSCULAR | Status: DC
  Administered 2021-02-14 (×2): 10 mg via INTRAVENOUS
  Filled 2021-02-12 (×3): qty 1

## 2021-02-12 NOTE — Progress Notes (Signed)
Physical Therapy Treatment Patient Details Name: Edward Atkinson MRN: [id number] DOB: Feb 23, 1961 Today's Date: 02/12/2021   History of Present Illness Pt is a 60 y.o. male with medical history significant for chronic hypoxic respiratory failure on 2 L continuous nasal cannula in the setting of interstitial lung disease, type 2 diabetes mellitus, pulmonary embolism in 2020 now chronically anticoagulated on Eliquis, who arrived to ED with shortness of breath. MD assessment includes:  acute on chronic hypoxic respiratory failure with hypoxia, possible sepsis due to bilateral pneumonia, rapid atrial fibrillation, anemia, and mild elevation of troponin with suspected demand ischemia.    PT Comments    Pt received supine in bed, agreeable to PT. Remains motivated and determined to complete mobility/therex. Prolonged recovery time required after talking while supine in bed, sitting up to EOB, stand pivot to recliner, LAQ and GH horizontal add/abd in order to increase SpO2 >85% while on 8L O2 with VC for diaphragmatic breathing. Pt appeared to tremble and RR increased to >40 after each task, lowest SpO2 noted was 68%. Pt asking to return to 3L O2 at end of session; PT remained in room until pt was a solid 85%. Would benefit from skilled PT to address above deficits and promote optimal return to PLOF.    Recommendations for follow up therapy are one component of a multi-disciplinary discharge planning process, led by the attending physician.  Recommendations may be updated based on patient status, additional functional criteria and insurance authorization.  Follow Up Recommendations  Home health PT;Supervision for mobility/OOB     Equipment Recommendations  Rolling walker with 5" wheels;3in1 (PT)    Recommendations for Other Services       Precautions / Restrictions Precautions Precautions: Fall Precaution Comments: monitor O2 - will drop <70 with seated therex Restrictions Weight Bearing  Restrictions: No     Mobility  Bed Mobility Overal bed mobility: Modified Independent             General bed mobility comments: PT monitoring O2 sats - high 70's on 5L with talking (prior to mobility)    Transfers Overall transfer level: Needs assistance Equipment used: None Transfers: Sit to/from [facility] Sit to Stand: Supervision Stand pivot transfers: Supervision       General transfer comment: pt has strength required to perform activity but is severely limited by respiratory response to activity  Ambulation/Gait             General Gait Details: deferred due to low O2 sats and increased RR with STS and seated therex - unsafe   Stairs             Wheelchair Mobility    Modified Rankin (Stroke Patients Only)       Balance Overall balance assessment: Needs assistance Sitting-balance support: Feet supported;No upper extremity supported Sitting balance-Leahy Scale: Normal     Standing balance support: During functional activity;Single extremity supported Standing balance-Leahy Scale: Good Standing balance comment: Limited standing stand, he did reach for armrest of recliner during stand pivot                            Cognition Arousal/Alertness: Awake/alert Behavior During Therapy: WFL for tasks assessed/performed Overall Cognitive Status: Within Functional Limits for tasks assessed                                 General Comments: Very  pleasant & agreeable to tx.      Exercises Total Joint Exercises Long Arc Quad: AROM;Strengthening;Both;Seated;5 reps (3 second hold) Other Exercises Other Exercises: Completed seated therex with extended rest breaks and difficulty recovering. LAQ (5 reps) previously noted and shoulder horizontal add/abd with VC for scap retraction, 4 reps with pt reporting lightheadedness. Pt on 8L O2 throughout session. Was unable to recover to greater than 84% with >5 minutes  recovery time between therex and after stand pivot transfer.    General Comments        Pertinent Vitals/Pain Pain Assessment: No/denies pain    Home Living                      Prior Function            PT Goals (current goals can now be found in the care plan section) Acute Rehab PT Goals Patient Stated Goal: walk again without being so SOB    Frequency    Min 2X/week      PT Plan      Co-evaluation              AM-PAC PT "6 Clicks" Mobility   Outcome Measure  Help needed turning from your back to your side while in a flat bed without using bedrails?: None Help needed moving from lying on your back to sitting on the side of a flat bed without using bedrails?: None Help needed moving to and from a bed to a chair (including a wheelchair)?: A Little Help needed standing up from a chair using your arms (e.g., wheelchair or bedside chair)?: A Little Help needed to walk in hospital room?: A Little Help needed climbing 3-5 steps with a railing? : A Little 6 Click Score: 20    End of Session Equipment Utilized During Treatment: Oxygen Activity Tolerance: Patient tolerated treatment well;Patient limited by fatigue;Treatment limited secondary to medical complications (Comment) (desaturation) Patient left: in chair;with call bell/phone within reach Nurse Communication: Mobility status PT Visit Diagnosis: Muscle weakness (generalized) (M62.81);Difficulty in walking, not elsewhere classified (R26.2)     Time: 1610-9604 PT Time Calculation (min) (ACUTE ONLY): 39 min  Charges:  $Therapeutic Exercise: 23-37 mins $Therapeutic Activity: 8-22 mins                     Patrina Levering PT, DPT 02/12/21 4:35 PM [phone]

## 2021-02-12 NOTE — Progress Notes (Signed)
The patient has bronchiectasis, confirmed on a CT on 02/07/2021. Patient has had frequent exacerbations/chest infections requiring antibiotic therapy.  The patient has tried other standard treatments such as a PEP device flutter valve. However, the PEP device is insufficient in controlling the symptoms surrounding Edward Atkinson bronchiectasis .  Patient would benefit from HFCWO (High Frequency Chest Wall Oscillation) Vest Therapy or an Afflovest.

## 2021-02-12 NOTE — Progress Notes (Signed)
Progress Note    Edward Atkinson  QVZ:[id number] DOB: Oct 15, 1960  DOA: 01/13/2021 PCP: Rusty Aus, MD      Brief Narrative:    Medical records reviewed and are as summarized below:  Edward Atkinson is a 60 y.o. male with history of follicular lymphoma, PIRJJ-[id number] infection, interstitial lung disease and chronic respiratory failure, and type 2 diabetes mellitus.       Assessment/Plan:   Principal Problem:   Acute on chronic respiratory failure with hypoxia (HCC) Active Problems:   Type 2 diabetes mellitus with hypoglycemia without coma, without long-term current use of insulin (HCC)   Follicular lymphoma (HCC)   SOB (shortness of breath)   Interstitial lung disease (HCC)   Severe sepsis (HCC)   CAP (community acquired pneumonia)   Elevated troponin   Protein calorie malnutrition (HCC)   Protein-calorie malnutrition, severe   AF (paroxysmal atrial fibrillation) (HCC)   Thrombocytosis   Leukocytosis   Pressure injury of skin   Hyponatremia   Uncontrolled type 2 diabetes mellitus with hyperglycemia (HCC)   Nutrition Problem: Severe Malnutrition Etiology: chronic illness (lymphoma, ILD)  Signs/Symptoms: severe fat depletion, severe muscle depletion, percent weight loss Percent weight loss: 16 %   Body mass index is 21.35 kg/m.  Acute on chronic hypoxemic respiratory failure, exacerbation of interstitial lung disease, bronchiectasis: He is down to 3 L/min oxygen via nasal cannula which is about her baseline oxygen requirement.  However, he still has significant shortness of breath and oxygen desaturation with minimal exertion.  Continue IV steroids.  Follow-up with pulmonologist for further recommendations.   Type II DM with severe hyperglycemia: Hold steroids is contributing to severe hyperglycemia.  Increase insulin glargine from 16 units to 18 units twice daily..  Continue NovoLog.  Paroxysmal atrial fibrillation: Continue Eliquis  Follicular lymphoma:  Completed chemotherapy.  S/p removal of Port-A-Cath on 02/09/2021     Diet Order             Diet Carb Modified Fluid consistency: Thin; Room service appropriate? Yes  Diet effective now                      Consultants: Pulmonologist Palliative care  Procedures: S/p removal of Port-A-Cath on 02/09/2021    Medications:    ALPRAZolam  0.25 mg Oral q morning   apixaban  5 mg Oral BID   Chlorhexidine Gluconate Cloth  6 each Topical Daily   digoxin  0.125 mg Oral Daily   diltiazem  120 mg Oral Daily   docusate sodium  200 mg Oral BID   feeding supplement (NEPRO CARB STEADY)  237 mL Oral TID BM   furosemide  20 mg Intravenous Daily   guaiFENesin  1,200 mg Oral BID   insulin aspart  0-15 Units Subcutaneous TID WC   insulin aspart  0-5 Units Subcutaneous QHS   insulin aspart  10 Units Subcutaneous TID WC   insulin glargine-yfgn  16 Units Subcutaneous BID   ipratropium-albuterol  3 mL Nebulization TID   methylPREDNISolone (SOLU-MEDROL) injection  40 mg Intravenous Q12H   multivitamin with minerals  1 tablet Oral Daily   mycophenolate  750 mg Oral BID   polyethylene glycol  17 g Oral Daily   sodium chloride flush  10-40 mL Intracatheter Q12H   sulfamethoxazole-trimethoprim  1 tablet Oral Q12H   Continuous Infusions:   Anti-infectives (From admission, onward)    Start     Dose/Rate Route Frequency Ordered Stop  01/31/21 1000  azithromycin (ZITHROMAX) tablet 250 mg  Status:  Discontinued       See Hyperspace for full Linked Orders Report.   250 mg Oral Daily 01/30/21 1532 01/31/21 1218   01/30/21 1630  azithromycin (ZITHROMAX) tablet 500 mg       See Hyperspace for full Linked Orders Report.   500 mg Oral Daily 01/30/21 1532 01/30/21 1617   01/30/21 1630  piperacillin-tazobactam (ZOSYN) IVPB 3.375 g  Status:  Discontinued        3.375 g 12.5 mL/hr over 240 Minutes Intravenous Every 8 hours 01/30/21 1540 01/31/21 1218   01/23/21 2200   sulfamethoxazole-trimethoprim (BACTRIM) 400-80 MG per tablet 1 tablet        1 tablet Oral Every 12 hours 01/23/21 1730     01/19/21 2000  sulfamethoxazole-trimethoprim (BACTRIM) 400-80 MG per tablet 1 tablet  Status:  Discontinued        1 tablet Oral Daily 01/19/21 1759 01/23/21 1730   01/15/21 2200  acyclovir (ZOVIRAX) tablet 400 mg  Status:  Discontinued        400 mg Oral 2 times daily 01/15/21 1558 01/15/21 1559   01/15/21 1645  acyclovir (ZOVIRAX) 200 MG capsule 400 mg  Status:  Discontinued        400 mg Oral 2 times daily 01/15/21 1558 02/02/21 1344   01/15/21 1515  acyclovir (ZOVIRAX) tablet 400 mg  Status:  Discontinued        400 mg Oral 2 times daily 01/15/21 1424 01/15/21 1556   01/14/21 1900  azithromycin (ZITHROMAX) 500 mg in sodium chloride 0.9 % 250 mL IVPB        500 mg 250 mL/hr over 60 Minutes Intravenous Every 24 hours 01/13/21 1835 01/20/21 2359   01/14/21 1900  cefTRIAXone (ROCEPHIN) 2 g in sodium chloride 0.9 % 100 mL IVPB  Status:  Discontinued        2 g 200 mL/hr over 30 Minutes Intravenous Every 24 hours 01/13/21 1835 01/13/21 2251   01/13/21 2345  ceFEPIme (MAXIPIME) 2 g in sodium chloride 0.9 % 100 mL IVPB  Status:  Discontinued        2 g 200 mL/hr over 30 Minutes Intravenous Every 8 hours 01/13/21 2309 01/19/21 2035   01/13/21 1800  azithromycin (ZITHROMAX) 500 mg in sodium chloride 0.9 % 250 mL IVPB        500 mg 250 mL/hr over 60 Minutes Intravenous  Once 01/13/21 1746 01/13/21 1958   01/13/21 1800  cefTRIAXone (ROCEPHIN) 1 g in sodium chloride 0.9 % 100 mL IVPB        1 g 200 mL/hr over 30 Minutes Intravenous  Once 01/13/21 1746 01/13/21 2054              Family Communication/Anticipated D/C date and plan/Code Status   DVT prophylaxis: SCDs Start: 01/13/21 1831 apixaban (ELIQUIS) tablet 5 mg     Code Status: Full Code  Family Communication: None Disposition Plan:    Status is: Inpatient  Remains inpatient appropriate  because:Inpatient level of care appropriate due to severity of illness  Dispo: The patient is from: Home              Anticipated d/c is to: Home              Patient currently is not medically stable to d/c.   Difficult to place patient No           Subjective:   Interval events noted.  He still feels short of breath with minimal exertion.  Objective:    Vitals:   02/12/21 0500 02/12/21 0734 02/12/21 0821 02/12/21 1336  BP:  135/82    Pulse:  80 87 (!) 102  Resp:  17 20 (!) 24  Temp: 97.7 F (36.5 C) 98.3 F (36.8 C)    TempSrc: Oral Oral    SpO2:  98% 95% (!) 89%  Weight:      Height:       No data found.   Intake/Output Summary (Last 24 hours) at 02/12/2021 1339 Last data filed at 02/12/2021 0949 Gross per 24 hour  Intake 480 ml  Output 1916 ml  Net -1436 ml   Filed Weights   02/09/21 0258 02/11/21 0412 02/12/21 0355  Weight: 67.9 kg 71.6 kg 71.4 kg    Exam:  GEN: NAD SKIN: Stage II sacral decubitus ulcer EYES: EOMI ENT: MMM CV: RRR PULM: Bibasilar fine rales.  No wheezing ABD: soft, ND, NT, +BS CNS: AAO x 3, non focal EXT: No edema or tenderness       Data Reviewed:   I have personally reviewed following labs and imaging studies:  Labs: Labs show the following:   Basic Metabolic Panel: Recent Labs  Lab 02/09/21 0700 02/10/21 0534  NA 135 133*  K 4.5 4.5  CL 94* 93*  CO2 32 32  GLUCOSE 277* 384*  BUN 20 21  CREATININE 0.76 0.63  CALCIUM 9.1 8.7*  MG  --  2.0   GFR Estimated Creatinine Clearance: 96.7 mL/min (by C-G formula based on SCr of 0.63 mg/dL). Liver Function Tests: No results for input(s): AST, ALT, ALKPHOS, BILITOT, PROT, ALBUMIN in the last 168 hours. No results for input(s): LIPASE, AMYLASE in the last 168 hours. No results for input(s): AMMONIA in the last 168 hours. Coagulation profile No results for input(s): INR, PROTIME in the last 168 hours.  CBC: Recent Labs  Lab 02/10/21 0534  WBC 7.1  HGB  10.1*  HCT 29.7*  MCV 86.6  PLT 186   Cardiac Enzymes: No results for input(s): CKTOTAL, CKMB, CKMBINDEX, TROPONINI in the last 168 hours. BNP (last 3 results) No results for input(s): PROBNP in the last 8760 hours. CBG: Recent Labs  Lab 02/11/21 1137 02/11/21 1545 02/11/21 2019 02/12/21 0736 02/12/21 1118  GLUCAP 248* 191* 297* 272* 298*   D-Dimer: No results for input(s): DDIMER in the last 72 hours. Hgb A1c: No results for input(s): HGBA1C in the last 72 hours. Lipid Profile: No results for input(s): CHOL, HDL, LDLCALC, TRIG, CHOLHDL, LDLDIRECT in the last 72 hours. Thyroid function studies: No results for input(s): TSH, T4TOTAL, T3FREE, THYROIDAB in the last 72 hours.  Invalid input(s): FREET3 Anemia work up: No results for input(s): VITAMINB12, FOLATE, FERRITIN, TIBC, IRON, RETICCTPCT in the last 72 hours. Sepsis Labs: Recent Labs  Lab 02/10/21 0534  WBC 7.1    Microbiology Recent Results (from the past 240 hour(s))  Culture, BAL-quantitative w Gram Stain     Status: None   Collection Time: 02/08/21  7:50 AM   Specimen: SPU; Respiratory  Result Value Ref Range Status   Specimen Description   Final    SPUTUM Performed at [facility], [address], [place], [place] [address]    Special Requests   Final    NONE Performed at [facility], [address] [place], [place] [address]    Gram Stain   Final    NO SQUAMOUS EPITHELIAL CELLS SEEN NO  WBC SEEN FEW GRAM POSITIVE RODS FEW GRAM POSITIVE COCCI    Culture   Final    FEW Normal respiratory flora-no Staph aureus or Pseudomonas seen Performed at [facility], [address], [place], [place] [address]    Report Status 02/10/2021 FINAL  Final    Procedures and diagnostic studies:  No results found.             LOS: 30 days   [place] [employer], advertising on [email or web address]. If 7PM-7AM, please contact night-coverage at  [email or web address]     02/12/2021, 1:39 PM

## 2021-02-12 NOTE — Progress Notes (Signed)
Pulmonary Medicine          Date: 02/12/2021,   MRN# [id number] Edward Atkinson Sep 24, 1960     AdmissionWeight: 63.5 kg                 CurrentWeight: 71.4 kg   Referring physician: Dr Roderic Palau   CHIEF COMPLAINT:   Acute on chronic hypoxemic respiratory failure   HISTORY OF PRESENT ILLNESS   60 yo M w/hx of chronic hypoxemia and ILD post COVID19, T2DM, PE on eliquis, Lymphoma s/p Rituxan which is completed.  Came in due to worsening SOB and DOE specifically mild exertional defacation/urination with feelings of presyncope and severe dyspnea which started to get worse post tapering from steroid appx few days prior to onset of symptom progression.  He was placed on solumedrol and reported mild improvement.  He is on 10L /min [place] during my evaluation.  He had CT chest done with PE protocol and noted to have bilateral GGO infiltrates worse compared to June study. PCCM consultation for further evaluation and management.     01/17/21-  patient seems to be slighly clinically worse post reduction of IV steroids yesterday indicative of ongoing ILD exacerbation, thus far infectious workup is negative but inflammatory biomarkers are highly elevated also pointing towards ILD exacerbation.  There is new CXR in process this morning for interval changes. We discussed increasing steroids slightly and giving more time.  I will also initiate steroid sparing immunomodulation with BID cellcept as patient will likely need therapy for prolonged time period.  Compensatory tachycardia noted appreciate cardiology team.  Sugars with peaks and valleys with adjusted steroid therapy.  Patient with muscle wasting and protein calorie malnutrition but eating better this am.  01/18/21- patient is on HFNC at 60%, feels better, has not been able to tolerate BIPAP.  Wife is at bedside.  BP is borderline low MAP 76.  He has negative infectious workup and thus far ILD exacerbation seems to be main differential. He  continues to have peaks and valleys in blood glucose.  We started steroid sparing regimen with cellcept bid 250 mg and will increase dose as we taper off prednisone.  Remains tachyarrtythmic but improved. Appreciate everyone involved.     01/19/21- patient seen and examined at bedside.  Wife present during evaluation.  Ow HFNC weaned to 41%/45L/MIN.  CRP is still up, have increased cellcept and reducing solumedrol.  Patient felt stronger physically and was able to get OOB to chair. Cough is less now.  He is non-labored with breathing no accessory mm use today. He had dark stools with reduced hb, FOBT ordred.  Haptoglobin is elevated. Pathology slide review ordered.   01/20/21- patient is essentially unchanged from yesterday from pulmonary perspective. CXR also unchanged,  bloodwork with reduction in CRP.  Plan to wean O2 and continue OOB with PT/OT,  Continue current steroid dose and Cellcept.   01/21/21- patient is stable on HFNC.  He had small set back with increased O2 req after exerting himself in bathroom.  HFNC from 41/45 to 54/45.   He diuresed very well with lasix 20 bid >2L urine in past 24h.  Tachycardia is improved. He does not feel weaker and infact states he feels slowly improved.   01/22/21- I spoke to patient regarding goals of care and recommend DNR.  He is agreeable but I asked him to discuss with wife and family out of respect and we will revisit tommorow.  He states he is feeling better  and CRP is trending down but he remains on HFNC 60/40.  He was able to get OOB today and walked to bathroom but experienced mild desaturation with quick recovery "not much issues".      01/23/21- patient is still on high flow nasal canula , he is on 56%/45L.  We reviewed code status and he is still in discussion with family regarding DNR.   We discussed lung transplant per family request and this may be a future option if he is strong enough and recovers.  I have increased insluin TID to 20 from 15 and  increased bactrim to bid SS   01/24/21- patient weaned on HFNC to 49/45, he was able to use BIPAP overnight thinks its helping.  CXR this AM with interval improvement. Will keep on current dose of meds.  Repeat CRP in am.  Last BM yesterday well formed no blood, eating good, urine output very good on 20 bid lasix. Getting OOB with  transient desaturation.  Overall slight improvement.    01/25/21- patient has weaned down to 35%/40L/min.  He remains full code. Reviewed medical plan with Dr Jimmye Norman today.  Reduced steroids from 50 BID to 40 BID and also reduced insulin slightly.    01/26/21- patient is further weaned to 15L humidified HF nasal canula.  He did PT ROM exercises with mild desaturation. CXR today with expiratory image unable to see much interval changes. Patient reports improvement clinically. Plan to continue current regimen. Sugars slightly high due to steroids, will wean steroids soon. Continue cellcept as current 500 bid  01/27/21- patient is relatively same as yesterday, his O2 is weaned down to 14L.  He remains on IV solumedrol at 50 bid, we did discuss weaning this further to 40 BID. Will keep cellcept at current dose 500 bid.  His ankles have been down in seated position and are edematous on examination. He continues to participate with PT and feels good subjectively. We discussed starting dose of lasix for pedal edema.    01/28/21- patient examined at bedside, s/p PT/OT session with slight dyspnea.  He was weaned to 10L O2 but then went back up to 13 post PT due to increased WOB.  He has complete resolution of LE edema and appears to be euvolemic at this point. We discussed reducing soluemdrol today to 20 bid.  I reviewed plan with Dr Leslye Peer today.  01/29/21- patient examined at bedside.  He had a good day and feels better. We reviewed medical plan and are reducing steroids to PO prednisone starting at 1m daily.   01/30/21-  Patient seen and examined at bedside.  CXR this am with  worsening pulmonary edema, included below.  Vitals stable with mild tachypnea and tachycardia with activity. Will deliver 1 dose IV lasix 20 today. Plan to continue Pred PO and cellcept at 500 bid. Pulmonary intensive PT with IS and will initiate Metaneb therapy with albuterol. Blood work today will be repeated CRP CMP phos procal ESR.  Patient is agreeable to BIPAP QHS and Metaneb BID.   01/31/21-  patient had set back yesterday with mild flash pulm edema on CXR. He diuresed very well overnight and feels back to where he was at previously.  He had transient reactive leukocytosis as well.  We initiated empirically zosyn and zithromax on top of bid bactrim.  This probably could be dcd since he recovered so quickly which would not occur with pneumonia.  Ill go head and dc this today but continue PO bactrim.  Will stay on pred 50 today with cellcept at 500 bid as previous.  His O2 has been reduced from 95 to 70% overnight and hes working with PT today. Hopefully we can wean this back down to 10L as he was previosly then back to his home setting.   02/01/21- patient is somewhat better today, he is being weaned on O2 and is now down to 40%. He had BM today. Leaving prednisone at current dose and cxr today.   02/02/21- patient is now on 15L HF bubbler.  He reports feeling better.  He had 3 bowel movements after stool softner. He is working with PT/OT.  His long term prognosis is poor unfortunately and we appreciate collaboration with Palliative care team.   02/03/21- patient had over exerted himself again and required increased O2. He states he feels better and just did more then he should have with PT.  His wbc count is trending down. His CRP is trending down.  I have started morhpine for air hunger and pain at sacrum for skin breakdown and pressure ulceration.     02/04/21- patient is unchanged.  He wants to keep fighting to improve.  CRP is trending down again.  ESR is upward trending. He was unable to perform  adequately with PT and had severe dyspnea and desaturation just to get OOB to chair. Discussed case with [employer] to see if he can have HFNC at home but the device has been recalled. Discussed patient with Dr Roosevelt Locks plan is for additional diuresis.  I have increased cellcept to 750 BID today  02/05/21- patient with UOP >2L yesterday, his spO2 req remains HFNC 35/40.  He feels well and wants to keep working to be discharged to rehab. We have ordered NIV for him.  Patient has pulmonary fibrosis with restrictive lung disease and would benefit from device such as Trilogy 100/200 with O2 bleed in.  His overall prognosis is poor.   02/06/21- patient is back up to 50%/45L.  He clinically feels well but just not able to get off the oxygen or improve as well as we had hoped.  I had lengthy discussion with patient he wishes to continue current inpatient therapy and wants to be more aggressive medically.  I have increased steroids to solumedrol 40 bid again.    02/07/21- patient is unchanged, he states he feels well.  Sitting up in chair speaking infull sentences but still rquiring HFNC at 50%/45L.  Today will perform HRCT to evaluate details of pulmonary parenchyma.   02/08/21- patient s/p HRCT with bilateral pulmonary fibrosis and bronchiectasis.  We discussed [employer] VEST therapy.  Reviewed with oncology will plan to remove chest port to allow patient to use vest therapy.  He is working with PT to wean O2.   02/09/21- patient weaned to 9L/min.  For port removal today.   02/10/21- patient weaned to 6L min.  Reviewed care plan with patient and attending physician   02/11/21- patient stable overnight.  On 6L/min [place].  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    02/12/21- patient was approved for NIV at home.  He is down to 3L/min [place].   He is in process of transitioning to PO dexamethasone.   He is working with PT.     PAST MEDICAL HISTORY   Past Medical History:  Diagnosis  Date  . Chronic respiratory failure with hypoxia (HCC)    2L  continuous  . COVID-19   . Diabetes mellitus without complication (  [place])   . Diverticulitis   . Follicular lymphoma of intra-abdominal lymph nodes ([place]) 02/15/2019  . ILD (interstitial lung disease) ([place])      SURGICAL HISTORY   Past Surgical History:  Procedure Laterality Date  . CHOLECYSTECTOMY    . COLON RESECTION     DUE TO DIVERTICULITIS  . FLEXIBLE BRONCHOSCOPY N/A 12/02/2019   Procedure: FLEXIBLE BRONCHOSCOPY;  Surgeon: Ottie Glazier, MD;  Location: [facility];  Service: Thoracic;  Laterality: N/A;  . PORTA CATH INSERTION N/A 03/11/2019   Procedure: PORTA CATH INSERTION;  Surgeon: Algernon Huxley, MD;  Location: [place] [facility];  Service: Cardiovascular;  Laterality: N/A;  . PORTA CATH REMOVAL Right 02/09/2021   Procedure: PORTA CATH REMOVAL;  Surgeon: Katha Cabal, MD;  Location: [place] [facility];  Service: Cardiovascular;  Laterality: Right;  . PULMONARY THROMBECTOMY N/A 01/29/2019   Procedure: PULMONARY THROMBECTOMY;  Surgeon: Algernon Huxley, MD;  Location: [place] [facility];  Service: Cardiovascular;  Laterality: N/A;     FAMILY HISTORY   Family History  Problem Relation Age of Onset  . Diabetes Brother      SOCIAL HISTORY   Social History   Tobacco Use  . Smoking status: Never  . Smokeless tobacco: Never  Vaping Use  . Vaping Use: Never used  Substance Use Topics  . Alcohol use: Yes    Alcohol/week: 0.0 - 2.0 standard drinks    Comment: occasional  . Drug use: Never     MEDICATIONS    Home Medication:    Current Medication:  Current Facility-Administered Medications:  .  acetaminophen (TYLENOL) tablet 650 mg, 650 mg, Oral, Q6H PRN **OR** acetaminophen (TYLENOL) suppository 650 mg, 650 mg, Rectal, Q6H PRN, Oswald Hillock, RPH .  ALPRAZolam (XANAX) tablet 0.25 mg, 0.25 mg, Oral, QHS PRN, Sharen Hones, MD .  ALPRAZolam Duanne Moron) tablet 0.25 mg, 0.25 mg, Oral, q morning,  Sharen Hones, MD, 0.25 mg at 02/12/21 1004 .  apixaban (ELIQUIS) tablet 5 mg, 5 mg, Oral, BID, Beers, Shanon Brow, RPH, 5 mg at 02/12/21 0910 .  Chlorhexidine Gluconate Cloth 2 % PADS 6 each, 6 each, Topical, Daily, Kathie Dike, MD, 6 each at 02/09/21 0828 .  digoxin (LANOXIN) tablet 0.125 mg, 0.125 mg, Oral, Daily, Memon, Jehanzeb, MD, 0.125 mg at 02/12/21 1122 .  diltiazem (CARDIZEM CD) 24 hr capsule 120 mg, 120 mg, Oral, Daily, Tali Coster, MD, 120 mg at 02/12/21 0910 .  diltiazem (CARDIZEM) injection 10 mg, 10 mg, Intravenous, Q6H PRN, Wyvonnia Dusky, MD, 10 mg at 01/24/21 2328 .  docusate sodium (COLACE) capsule 200 mg, 200 mg, Oral, BID, Wyvonnia Dusky, MD, 200 mg at 02/12/21 0910 .  feeding supplement (NEPRO CARB STEADY) liquid 237 mL, 237 mL, Oral, TID BM, Memon, Jehanzeb, MD, Last Rate: 0 mL/hr at 01/21/21 2130, 237 mL at 02/12/21 1008 .  furosemide (LASIX) injection 20 mg, 20 mg, Intravenous, Daily, Ameena Vesey, MD, 20 mg at 02/12/21 0910 .  guaiFENesin (MUCINEX) 12 hr tablet 1,200 mg, 1,200 mg, Oral, BID, Memon, Jehanzeb, MD, 1,200 mg at 02/12/21 0910 .  guaiFENesin-dextromethorphan (ROBITUSSIN DM) 100-10 MG/5ML syrup 5 mL, 5 mL, Oral, Q4H PRN, Kathie Dike, MD, 5 mL at 02/04/21 0850 .  HYDROmorphone (DILAUDID) injection 1 mg, 1 mg, Intravenous, Once PRN, Stegmayer, Kimberly A, PA-C .  insulin aspart (novoLOG) injection 0-15 Units, 0-15 Units, Subcutaneous, TID WC, Sharen Hones, MD, 5 Units at 02/12/21 1243 .  insulin aspart (novoLOG) injection 0-5 Units,  0-5 Units, Subcutaneous, QHS, Sharen Hones, MD, 3 Units at 02/11/21 2132 .  insulin aspart (novoLOG) injection 10 Units, 10 Units, Subcutaneous, TID WC, Loletha Grayer, MD, 10 Units at 02/12/21 1242 .  insulin glargine-yfgn (SEMGLEE) injection 16 Units, 16 Units, Subcutaneous, BID, Jennye Boroughs, MD, 16 Units at 02/12/21 1008 .  ipratropium-albuterol (DUONEB) 0.5-2.5 (3) MG/3ML nebulizer solution 3 mL, 3 mL,  Nebulization, TID, Jennye Boroughs, MD, 3 mL at 02/12/21 1336 .  lactulose (CHRONULAC) 10 GM/15ML solution 30 g, 30 g, Oral, BID PRN, Leslye Peer, Richard, MD .  levalbuterol [facility]) nebulizer solution 1.25 mg, 1.25 mg, Nebulization, Q4H PRN, Howerter, Justin B, DO, 1.25 mg at 01/17/21 2011 .  methylPREDNISolone sodium succinate (SOLU-MEDROL) 40 mg/mL injection 40 mg, 40 mg, Intravenous, Q12H, [place] Gins, Elzena Muston, MD, 40 mg at 02/12/21 1245 .  morphine 2 MG/ML injection 1 mg, 1 mg, Intravenous, Q3H PRN, Ottie Glazier, MD, 1 mg at 02/11/21 1827 .  multivitamin with minerals tablet 1 tablet, 1 tablet, Oral, Daily, Kathie Dike, MD, 1 tablet at 02/12/21 1249 .  mycophenolate (CELLCEPT) capsule 750 mg, 750 mg, Oral, BID, [place] Gins, Mateo Overbeck, MD, 750 mg at 02/12/21 1006 .  ondansetron (ZOFRAN) injection 4 mg, 4 mg, Intravenous, Q6H PRN, Stegmayer, Kimberly A, PA-C .  polyethylene glycol (MIRALAX / GLYCOLAX) packet 17 g, 17 g, Oral, Daily, Leslye Peer, Richard, MD, 17 g at 02/12/21 0910 .  simethicone (MYLICON) chewable tablet 80 mg, 80 mg, Oral, QID PRN, Loletha Grayer, MD, 80 mg at 01/29/21 1411 .  sodium chloride (OCEAN) 0.65 % nasal spray 1 spray, 1 spray, Each Nare, PRN, Loletha Grayer, MD, 1 spray at 01/30/21 2142 .  sodium chloride flush (NS) 0.9 % injection 10-40 mL, 10-40 mL, Intracatheter, Q12H, Kathie Dike, MD, 10 mL at 02/12/21 0916 .  sodium chloride flush (NS) 0.9 % injection 10-40 mL, 10-40 mL, Intracatheter, PRN, Kathie Dike, MD, 10 mL at 01/24/21 0915 .  sulfamethoxazole-trimethoprim (BACTRIM) 400-80 MG per tablet 1 tablet, 1 tablet, Oral, Q12H, Ottie Glazier, MD, 1 tablet at 02/12/21 1006 .  traZODone (DESYREL) tablet 50 mg, 50 mg, Oral, QHS PRN, Sharion Settler, NP, 50 mg at 02/10/21 2202    ALLERGIES   Penicillins     REVIEW OF SYSTEMS    Review of Systems:  Gen:  Denies  fever, sweats, chills weigh loss  HEENT: Denies blurred vision, double vision, ear pain, eye  pain, hearing loss, nose bleeds, sore throat Cardiac:  No dizziness, chest pain or heaviness, chest tightness,edema Resp:  admits to severe dyspnea Gi: Denies swallowing difficulty, stomach pain, nausea or vomiting, diarrhea, constipation, bowel incontinence Gu:  Denies bladder incontinence, burning urine Ext:   Denies Joint pain, stiffness or swelling Skin: Denies  skin rash, easy bruising or bleeding or hives Endoc:  Denies polyuria, polydipsia , polyphagia or weight change Psych:   admits to axniety  Other:  All other systems negative   VS: BP 135/82 (BP Location: Right Arm)   Pulse (!) 102   Temp 98.3 F (36.8 C) (Oral)   Resp (!) 24   Ht 6' (1.829 m)   Wt 71.4 kg   SpO2 (!) 89%   BMI 21.35 kg/m      PHYSICAL EXAM    GENERAL:NAD, no fevers, chills, no weakness no fatigue HEAD: Normocephalic, atraumatic.  EYES: Pupils equal, round, reactive to light. Extraocular muscles intact. No scleral icterus.  MOUTH: Moist mucosal membrane. Dentition intact. No abscess noted.  EAR, NOSE, THROAT: Clear without exudates. No external lesions.  NECK: Supple. No thyromegaly. No nodules. No JVD.  PULMONARY: Bilateral rhonchi improved from previous CARDIOVASCULAR: S1 and S2. Regular rate and rhythm. No murmurs, rubs, or gallops. No edema. Pedal pulses 2+ bilaterally.  GASTROINTESTINAL: Soft, nontender, nondistended. No masses. Positive bowel sounds. No hepatosplenomegaly.  MUSCULOSKELETAL: No swelling, clubbing, or edema. Range of motion full in all extremities.  NEUROLOGIC: Cranial nerves II through XII are intact. No gross focal neurological deficits. Sensation intact. Reflexes intact.  SKIN: No ulceration, lesions, rashes, or cyanosis. Skin warm and dry. Turgor intact.  PSYCHIATRIC: Mood, affect within normal limits. The patient is awake, alert and oriented x 3. Insight, judgment intact.       IMAGING    CT Angio Chest PE W/Cm &/Or Wo Cm  Result Date: 01/13/2021 CLINICAL DATA:   Respiratory distress, hypoxia EXAM: CT ANGIOGRAPHY CHEST WITH CONTRAST TECHNIQUE: Multidetector CT imaging of the chest was performed using the standard protocol during bolus administration of intravenous contrast. Multiplanar CT image reconstructions and MIPs were obtained to evaluate the vascular anatomy. CONTRAST:  35m OMNIPAQUE IOHEXOL 350 MG/ML SOLN COMPARISON:  10/26/2020, 01/13/2021 FINDINGS: Cardiovascular: This is a technically adequate evaluation of the pulmonary vasculature. No filling defects or pulmonary emboli. The heart is unremarkable without pericardial effusion. No evidence of thoracic aortic aneurysm or dissection. Right chest wall port via internal jugular approach tip within the superior vena cava. Mediastinum/Nodes: No pathologic adenopathy within the mediastinum, hila, or axilla. Thyroid, trachea, and esophagus are unremarkable. Lungs/Pleura: There has been interval progression of the multifocal ground-glass airspace disease seen previously. Changes are most pronounced within the dependent lower lobes. No effusion or pneumothorax. The central airways are patent, with persistent bronchiectasis. Upper Abdomen: No acute abnormality. Musculoskeletal: No acute or destructive bony lesions. Reconstructed images demonstrate no additional findings. Review of the MIP images confirms the above findings. IMPRESSION: 1. No evidence of pulmonary embolus. 2. Progressive multifocal bilateral ground-glass airspace disease, greatest at the lung bases. The differential diagnosis would include progressive postinflammatory scarring and fibrosis, multifocal atypical pneumonia, or hypersensitivity/drug toxicity. 3. Stable bronchiectasis. Electronically Signed   By: MRanda NgoM.D.   On: 01/13/2021 17:13   CT Chest High Resolution  Result Date: 02/07/2021 CLINICAL DATA:  Inpatient. Interstitial lung disease. Worsening dyspnea. Chronic hypoxemia post COVID-19. History of lymphoma treated with Rituxan. EXAM:  CT CHEST WITHOUT CONTRAST TECHNIQUE: Multidetector CT imaging of the chest was performed following the standard protocol without intravenous contrast. High resolution imaging of the lungs, as well as inspiratory and expiratory imaging, was performed. COMPARISON:  02/05/2021 chest radiograph. 01/13/2021 chest CT angiogram. FINDINGS: Cardiovascular: Normal heart size. No significant pericardial effusion/thickening. Right internal jugular central venous catheter terminates at the cavoatrial junction. Normal course and caliber of the thoracic aorta. Stable dilated main pulmonary artery (3.7 cm diameter). Mediastinum/Nodes: No discrete thyroid nodules. Unremarkable esophagus. No pathologically enlarged axillary, mediastinal or hilar lymph nodes, noting limited sensitivity for the detection of hilar adenopathy on this noncontrast study. Lungs/Pleura: No pneumothorax. No pleural effusion. Severe patchy confluent peribronchovascular and peripheral ground-glass opacity, reticulation, traction bronchiectasis, architectural distortion and volume loss in both lungs with a basilar predominance. The ground-glass opacities have mildly improved since 01/13/2021 chest CT. The bronchiectasis, distortion and volume loss appear slightly worsened. No significant lobular air trapping or evidence of tracheobronchomalacia on the expiration sequence. No acute consolidative airspace disease, lung masses or significant pulmonary nodules. No frank honeycombing. Upper abdomen: Chronic mild elevation of the right hemidiaphragm. Musculoskeletal: No aggressive appearing focal osseous lesions. Mild thoracic  spondylosis. IMPRESSION: 1. Spectrum of findings compatible with severe basilar predominant fibrotic interstitial lung disease with ground-glass and no frank honeycombing predominance. Ground-glass opacities have slightly improved and bronchiectasis and other findings of fibrosis have slightly worsened since recent 01/13/2021 chest CT angiogram  study. Findings are most compatible with a severe rapidly progressive postinflammatory fibrosis or fibrotic NSIP pattern. Findings are suggestive of an alternative diagnosis (not UIP) per consensus guidelines: Diagnosis of Idiopathic Pulmonary Fibrosis: An Official ATS/ERS/JRS/ALAT Clinical Practice Guideline. [place], Iss 5, [phone], Dec 31 2016. 2. Stable dilated main pulmonary artery, suggesting chronic pulmonary arterial hypertension. 3. Chronic mild elevation of the right hemidiaphragm. Electronically Signed   By: Ilona Sorrel M.D.   On: 02/07/2021 17:29   PERIPHERAL VASCULAR CATHETERIZATION  Result Date: 02/09/2021 See surgical note for result.  DG Chest Port 1 View  Result Date: 02/05/2021 CLINICAL DATA:  Bilateral pulmonary infiltrates on chest x-ray. EXAM: PORTABLE CHEST 1 VIEW COMPARISON:  Chest radiograph 02/01/2021 FINDINGS: Power injectable right IJ port central venous catheter tip projects at the level of the superior cavoatrial junction. Unchanged low lung volumes with lower lobe predominant interstitial opacities, left greater than right. No new focal consolidation, pleural effusion, or pneumothorax. Heart is normal in size. Surgical clips in the right upper abdomen. IMPRESSION: Bilateral interstitial airspace opacities are unchanged compared to 02/01/2021. No new focal consolidation, pleural effusion, or pneumothorax. Electronically Signed   By: Ileana Roup M.D.   On: 02/05/2021 10:59   DG Chest Port 1 View  Result Date: 02/01/2021 CLINICAL DATA:  Shortness of breath, edema EXAM: PORTABLE CHEST 1 VIEW COMPARISON:  01/30/2021 FINDINGS: Stable positioning of right IJ approach Port-A-Cath. Heart size within normal limits. Low lung volumes. Slight interval worsening of bilateral airspace opacities, left worse than right. No pleural effusion or pneumothorax. IMPRESSION: Slight interval worsening of bilateral airspace opacities, left worse than right.  Electronically Signed   By: Davina Poke D.O.   On: 02/01/2021 14:37   DG Chest Port 1 View  Result Date: 01/30/2021 CLINICAL DATA:  Acute on chronic hypoxemic respiratory failure. EXAM: PORTABLE CHEST 1 VIEW COMPARISON:  January 28, 2021 FINDINGS: No pneumothorax. Stable cardiomediastinal silhouette and right Port-A-Cath. Left-greater-than-right pulmonary opacities/infiltrates. No other interval changes. IMPRESSION: 1. Persistent low lung volumes and bilateral pulmonary infiltrates. No interval changes. Electronically Signed   By: Dorise Bullion III M.D.   On: 01/30/2021 08:36   DG Chest Port 1 View  Result Date: 01/28/2021 CLINICAL DATA:  Shortness of breath. EXAM: PORTABLE CHEST 1 VIEW COMPARISON:  Multiple recent chest films. FINDINGS: The right IJ power port is stable. Persistent low lung volumes and bilateral pulmonary infiltrates. No definite pleural effusion or pneumothorax. IMPRESSION: Persistent low lung volumes and bilateral pulmonary infiltrates. Electronically Signed   By: Marijo Sanes M.D.   On: 01/28/2021 16:03   DG Chest Port 1 View  Result Date: 01/26/2021 CLINICAL DATA:  Shortness of breath, abnormal chest x-ray EXAM: PORTABLE CHEST 1 VIEW COMPARISON:  01/24/2021 FINDINGS: Right Port-A-Cath remains in place, unchanged. Low lung volumes with bilateral airspace opacities, most pronounced in the left perihilar and lower lobe, unchanged since prior study. No visible effusions or pneumothorax. Heart is normal size. IMPRESSION: Low lung volumes with bilateral airspace disease, left-greater-than-right. No significant change. Electronically Signed   By: Rolm Baptise M.D.   On: 01/26/2021 09:42   DG Chest Port 1 View  Result Date: 01/24/2021 CLINICAL DATA:  Follow-up multifocal pneumonia. EXAM: PORTABLE CHEST  1 VIEW COMPARISON:  01/22/2021 and older exams. FINDINGS: Mild interval decrease in the airspace lung opacities, most evident on the left. Lung volumes remain low. No new lung  abnormalities. No visualized pleural effusion.  No pneumothorax. Right internal jugular Port-A-Cath is stable. IMPRESSION: 1. Mild interval improvement in lung aeration with decreased airspace lung opacities, most notably on the left. Electronically Signed   By: Lajean Manes M.D.   On: 01/24/2021 07:57   DG Chest Port 1 View  Result Date: 01/22/2021 CLINICAL DATA:  Hypoxia, shortness of breath. EXAM: PORTABLE CHEST 1 VIEW COMPARISON:  January 20, 2021. FINDINGS: Stable cardiomediastinal silhouette. Right internal jugular Port-A-Cath is unchanged in position. Hypoinflation of the lungs is noted. Stable bilateral lung opacities are noted, left greater than right, most consistent with multifocal pneumonia. Bony thorax is unremarkable. IMPRESSION: Hypoinflation of the lungs. Stable bilateral lung opacities are noted, left greater than right, most consistent with multifocal pneumonia. Electronically Signed   By: Marijo Conception M.D.   On: 01/22/2021 19:26   DG Chest Port 1 View  Result Date: 01/20/2021 CLINICAL DATA:  Pneumonitis EXAM: PORTABLE CHEST 1 VIEW COMPARISON:  01/19/2021 FINDINGS: Shallow inspiration. Patchy infiltrates in the left lung and right lung base, similar to prior study and likely multifocal pneumonia. No pleural effusions. No pneumothorax. Mediastinal contours appear intact. Heart size is normal. Power port type central venous catheter on the right with tip over the cavoatrial junction region. Surgical clips in the right upper quadrant. IMPRESSION: Shallow inspiration. Patchy infiltrates in both lungs, likely multifocal pneumonia, without change. Electronically Signed   By: Lucienne Capers M.D.   On: 01/20/2021 03:56   DG Chest Port 1 View  Result Date: 01/19/2021 CLINICAL DATA:  Abnormality on previous chest x-ray in a 60 year old male. EXAM: PORTABLE CHEST 1 VIEW COMPARISON:  CT from January 13, 2021, chest x-ray January 18, 2021. FINDINGS: RIGHT-sided Port-A-Cath terminates at  the caval to atrial junction. Trachea is midline. Cardiomediastinal contours are stable. Interstitial and airspace opacities throughout the chest worse in the LEFT mid to upper chest, RIGHT upper chest lung bases bilaterally with similar appearance. On limited assessment there is no acute skeletal process. EKG leads project over the chest. IMPRESSION: No interval change in the appearance of the chest since previous imaging with continued decreased lung volumes and patchy bilateral opacities. Electronically Signed   By: Zetta Bills M.D.   On: 01/19/2021 07:59   DG Chest Port 1 View  Result Date: 01/18/2021 CLINICAL DATA:  Respiratory distress EXAM: PORTABLE CHEST 1 VIEW COMPARISON:  01/17/2021 FINDINGS: Cardiac shadow is stable. Right-sided chest wall port is again seen. Lungs are hypoinflated. Patchy airspace opacities again identified in both lungs left greater than right but stable from the prior exam. IMPRESSION: Patchy airspace opacities bilaterally stable from the prior exam. Electronically Signed   By: Inez Catalina M.D.   On: 01/18/2021 17:24   DG Chest Port 1 View  Result Date: 01/17/2021 CLINICAL DATA:  Worsening shortness of breath. EXAM: PORTABLE CHEST 1 VIEW COMPARISON:  01/14/2021 FINDINGS: 0826 hours. Low volume film. Patchy airspace disease again noted in both lungs, left greater than right. Cardiopericardial silhouette is at upper limits of normal for size. Right Port-A-Cath again noted. Telemetry leads overlie the chest. IMPRESSION: Low volume film with patchy bilateral airspace disease, left greater than right. No substantial change. Electronically Signed   By: Misty Stanley M.D.   On: 01/17/2021 08:53   DG Chest Port 1 View  Result Date:  01/14/2021 CLINICAL DATA:  Shortness of breath.  History of cancer.  Diabetes. EXAM: PORTABLE CHEST 1 VIEW.  Patient is rotated. COMPARISON:  Chest x-ray 01/13/2021, CT chest 01/13/2021 FINDINGS: Accessed right chest wall Port-A-Cath in stable  position. The heart and mediastinal contours are within normal limits. Low lung volumes with persistent diffuse patchy airspace opacities. No pleural effusion. No pneumothorax. No acute osseous abnormality. Right upper quadrant surgical clips. IMPRESSION: Low lung volumes with persistent diffuse patchy airspace opacities. Electronically Signed   By: Iven Finn M.D.   On: 01/14/2021 21:08           ASSESSMENT/PLAN   Acute on hypoxemic respiratory failure Due to acute exacerbation of Chronic lung disease/ILD with Critical illness Myopathy associated respiratory failure - present on admission  - COVID19 negative   - supplemental O2 during my evaluation 10l/min>>8L>>HFNC 7L -Respiratory viral panel-negative  -serum fungitell-negative  -legionella ab-negative  -nasal MRSA PCR-negative -Procalcitonin trend reviewed -strep pneumoniae ur AG -Histoplasma Ur Ag-negative -sputum resp cultures -AFB sputum expectorated specimen -sputum cytology  -completed  Cefepime course for cap -Reduced Solumedrol to 40 BID - 01/15/21>>50 BID>>40 BID>>30 BID>>20 BID>>pred 50>>soluemdrol 40 iv bid please watch surgars -reviewed pertinent imaging with patient today - ESR and CRP are elevated but improving -PT/OT for d/c planning  -please encourage patient to use incentive spirometer few times each hour while hospitalized.   -continue Cellcept 250 bid >>500 bid >>750BID -UOP is adequate -Discussed goal of care - recommend DNR patient is in agreement but still discussing with wife, palliative care service to follow  -lasix 20 daily PO     Thank you for allowing me to participate in the care of this patient.   Patient/Family are satisfied with care plan and all questions have been answered.   This document was prepared using Dragon voice recognition software and may include unintentional dictation errors.     Ottie Glazier, M.D.  Division of [place]

## 2021-02-13 ENCOUNTER — Inpatient Hospital Stay: Payer: BC Managed Care – PPO

## 2021-02-13 DIAGNOSIS — J849 Interstitial pulmonary disease, unspecified: Secondary | ICD-10-CM | POA: Diagnosis not present

## 2021-02-13 DIAGNOSIS — J9621 Acute and chronic respiratory failure with hypoxia: Secondary | ICD-10-CM | POA: Diagnosis not present

## 2021-02-13 LAB — BLOOD GAS, VENOUS
Acid-Base Excess: 10.2 mmol/L — ABNORMAL HIGH (ref 0.0–2.0)
Bicarbonate: 34.3 mmol/L — ABNORMAL HIGH (ref 20.0–28.0)
O2 Saturation: 93.3 %
Patient temperature: 37
pCO2, Ven: 43 mmHg — ABNORMAL LOW (ref 44.0–60.0)
pH, Ven: 7.51 — ABNORMAL HIGH (ref 7.250–7.430)
pO2, Ven: 61 mmHg — ABNORMAL HIGH (ref 32.0–45.0)

## 2021-02-13 LAB — CBC
HCT: 33.3 % — ABNORMAL LOW (ref 39.0–52.0)
Hemoglobin: 11.5 g/dL — ABNORMAL LOW (ref 13.0–17.0)
MCH: 29.8 pg (ref 26.0–34.0)
MCHC: 34.5 g/dL (ref 30.0–36.0)
MCV: 86.3 fL (ref 80.0–100.0)
Platelets: 238 10*3/uL (ref 150–400)
RBC: 3.86 MIL/uL — ABNORMAL LOW (ref 4.22–5.81)
RDW: 21.2 % — ABNORMAL HIGH (ref 11.5–15.5)
WBC: 5.8 10*3/uL (ref 4.0–10.5)
nRBC: 0 % (ref 0.0–0.2)

## 2021-02-13 LAB — GLUCOSE, CAPILLARY
Glucose-Capillary: 350 mg/dL — ABNORMAL HIGH (ref 70–99)
Glucose-Capillary: 247 mg/dL — ABNORMAL HIGH (ref 70–99)
Glucose-Capillary: 201 mg/dL — ABNORMAL HIGH (ref 70–99)
Glucose-Capillary: 273 mg/dL — ABNORMAL HIGH (ref 70–99)

## 2021-02-13 LAB — SEDIMENTATION RATE: Sed Rate: 38 mm/hr — ABNORMAL HIGH (ref 0–20)

## 2021-02-13 LAB — C-REACTIVE PROTEIN: CRP: 0.7 mg/dL (ref <1.0)

## 2021-02-13 NOTE — Progress Notes (Signed)
Pulmonary Medicine          Date: 02/13/2021,   MRN# [id number] Edward Atkinson 04/11/1961     AdmissionWeight: 63.5 kg                 CurrentWeight: 71.4 kg   Referring physician: Dr Roderic Palau   CHIEF COMPLAINT:   Acute on chronic hypoxemic respiratory failure   HISTORY OF PRESENT ILLNESS   60 yo M w/hx of chronic hypoxemia and ILD post COVID19, T2DM, PE on eliquis, Lymphoma s/p Rituxan which is completed.  Came in due to worsening SOB and DOE specifically mild exertional defacation/urination with feelings of presyncope and severe dyspnea which started to get worse post tapering from steroid appx few days prior to onset of symptom progression.  He was placed on solumedrol and reported mild improvement.  He is on 10L /min [place] during my evaluation.  He had CT chest done with PE protocol and noted to have bilateral GGO infiltrates worse compared to June study. PCCM consultation for further evaluation and management.     01/17/21-  patient seems to be slighly clinically worse post reduction of IV steroids yesterday indicative of ongoing ILD exacerbation, thus far infectious workup is negative but inflammatory biomarkers are highly elevated also pointing towards ILD exacerbation.  There is new CXR in process this morning for interval changes. We discussed increasing steroids slightly and giving more time.  I will also initiate steroid sparing immunomodulation with BID cellcept as patient will likely need therapy for prolonged time period.  Compensatory tachycardia noted appreciate cardiology team.  Sugars with peaks and valleys with adjusted steroid therapy.  Patient with muscle wasting and protein calorie malnutrition but eating better this am.  01/18/21- patient is on HFNC at 60%, feels better, has not been able to tolerate BIPAP.  Wife is at bedside.  BP is borderline low MAP 76.  He has negative infectious workup and thus far ILD exacerbation seems to be main differential. He  continues to have peaks and valleys in blood glucose.  We started steroid sparing regimen with cellcept bid 250 mg and will increase dose as we taper off prednisone.  Remains tachyarrtythmic but improved. Appreciate everyone involved.     01/19/21- patient seen and examined at bedside.  Wife present during evaluation.  Ow HFNC weaned to 41%/45L/MIN.  CRP is still up, have increased cellcept and reducing solumedrol.  Patient felt stronger physically and was able to get OOB to chair. Cough is less now.  He is non-labored with breathing no accessory mm use today. He had dark stools with reduced hb, FOBT ordred.  Haptoglobin is elevated. Pathology slide review ordered.   01/20/21- patient is essentially unchanged from yesterday from pulmonary perspective. CXR also unchanged,  bloodwork with reduction in CRP.  Plan to wean O2 and continue OOB with PT/OT,  Continue current steroid dose and Cellcept.   01/21/21- patient is stable on HFNC.  He had small set back with increased O2 req after exerting himself in bathroom.  HFNC from 41/45 to 54/45.   He diuresed very well with lasix 20 bid >2L urine in past 24h.  Tachycardia is improved. He does not feel weaker and infact states he feels slowly improved.   01/22/21- I spoke to patient regarding goals of care and recommend DNR.  He is agreeable but I asked him to discuss with wife and family out of respect and we will revisit tommorow.  He states he is feeling better  and CRP is trending down but he remains on HFNC 60/40.  He was able to get OOB today and walked to bathroom but experienced mild desaturation with quick recovery "not much issues".      01/23/21- patient is still on high flow nasal canula , he is on 56%/45L.  We reviewed code status and he is still in discussion with family regarding DNR.   We discussed lung transplant per family request and this may be a future option if he is strong enough and recovers.  I have increased insluin TID to 20 from 15 and  increased bactrim to bid SS   01/24/21- patient weaned on HFNC to 49/45, he was able to use BIPAP overnight thinks its helping.  CXR this AM with interval improvement. Will keep on current dose of meds.  Repeat CRP in am.  Last BM yesterday well formed no blood, eating good, urine output very good on 20 bid lasix. Getting OOB with  transient desaturation.  Overall slight improvement.    01/25/21- patient has weaned down to 35%/40L/min.  He remains full code. Reviewed medical plan with Dr Jimmye Norman today.  Reduced steroids from 50 BID to 40 BID and also reduced insulin slightly.    01/26/21- patient is further weaned to 15L humidified HF nasal canula.  He did PT ROM exercises with mild desaturation. CXR today with expiratory image unable to see much interval changes. Patient reports improvement clinically. Plan to continue current regimen. Sugars slightly high due to steroids, will wean steroids soon. Continue cellcept as current 500 bid  01/27/21- patient is relatively same as yesterday, his O2 is weaned down to 14L.  He remains on IV solumedrol at 50 bid, we did discuss weaning this further to 40 BID. Will keep cellcept at current dose 500 bid.  His ankles have been down in seated position and are edematous on examination. He continues to participate with PT and feels good subjectively. We discussed starting dose of lasix for pedal edema.    01/28/21- patient examined at bedside, s/p PT/OT session with slight dyspnea.  He was weaned to 10L O2 but then went back up to 13 post PT due to increased WOB.  He has complete resolution of LE edema and appears to be euvolemic at this point. We discussed reducing soluemdrol today to 20 bid.  I reviewed plan with Dr Leslye Peer today.  01/29/21- patient examined at bedside.  He had a good day and feels better. We reviewed medical plan and are reducing steroids to PO prednisone starting at 1m daily.   01/30/21-  Patient seen and examined at bedside.  CXR this am with  worsening pulmonary edema, included below.  Vitals stable with mild tachypnea and tachycardia with activity. Will deliver 1 dose IV lasix 20 today. Plan to continue Pred PO and cellcept at 500 bid. Pulmonary intensive PT with IS and will initiate Metaneb therapy with albuterol. Blood work today will be repeated CRP CMP phos procal ESR.  Patient is agreeable to BIPAP QHS and Metaneb BID.   01/31/21-  patient had set back yesterday with mild flash pulm edema on CXR. He diuresed very well overnight and feels back to where he was at previously.  He had transient reactive leukocytosis as well.  We initiated empirically zosyn and zithromax on top of bid bactrim.  This probably could be dcd since he recovered so quickly which would not occur with pneumonia.  Ill go head and dc this today but continue PO bactrim.  Will stay on pred 50 today with cellcept at 500 bid as previous.  His O2 has been reduced from 95 to 70% overnight and hes working with PT today. Hopefully we can wean this back down to 10L as he was previosly then back to his home setting.   02/01/21- patient is somewhat better today, he is being weaned on O2 and is now down to 40%. He had BM today. Leaving prednisone at current dose and cxr today.   02/02/21- patient is now on 15L HF bubbler.  He reports feeling better.  He had 3 bowel movements after stool softner. He is working with PT/OT.  His long term prognosis is poor unfortunately and we appreciate collaboration with Palliative care team.   02/03/21- patient had over exerted himself again and required increased O2. He states he feels better and just did more then he should have with PT.  His wbc count is trending down. His CRP is trending down.  I have started morhpine for air hunger and pain at sacrum for skin breakdown and pressure ulceration.     02/04/21- patient is unchanged.  He wants to keep fighting to improve.  CRP is trending down again.  ESR is upward trending. He was unable to perform  adequately with PT and had severe dyspnea and desaturation just to get OOB to chair. Discussed case with [employer] to see if he can have HFNC at home but the device has been recalled. Discussed patient with Dr Roosevelt Locks plan is for additional diuresis.  I have increased cellcept to 750 BID today  02/05/21- patient with UOP >2L yesterday, his spO2 req remains HFNC 35/40.  He feels well and wants to keep working to be discharged to rehab. We have ordered NIV for him.  Patient has pulmonary fibrosis with restrictive lung disease and would benefit from device such as Trilogy 100/200 with O2 bleed in.  His overall prognosis is poor.   02/06/21- patient is back up to 50%/45L.  He clinically feels well but just not able to get off the oxygen or improve as well as we had hoped.  I had lengthy discussion with patient he wishes to continue current inpatient therapy and wants to be more aggressive medically.  I have increased steroids to solumedrol 40 bid again.    02/07/21- patient is unchanged, he states he feels well.  Sitting up in chair speaking infull sentences but still rquiring HFNC at 50%/45L.  Today will perform HRCT to evaluate details of pulmonary parenchyma.   02/08/21- patient s/p HRCT with bilateral pulmonary fibrosis and bronchiectasis.  We discussed [employer] VEST therapy.  Reviewed with oncology will plan to remove chest port to allow patient to use vest therapy.  He is working with PT to wean O2.   02/09/21- patient weaned to 9L/min.  For port removal today.   02/10/21- patient weaned to 6L min.  Reviewed care plan with patient and attending physician   02/11/21- patient stable overnight.  On 6L/min [place].  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    02/12/21- patient was approved for NIV at home.  He is down to 3L/min [place].   He is in process of transitioning to PO dexamethasone.   He is working with PT.   02/13/21- patient with tapering protocol today, were tapering  steroids slower due to exacerbation re-flare post decreased dosing and PO transition previosly.  Plan for Solumedrol 10 bid IV plus dexamethasone 10 bid PO and repeat CXR today.  PAST MEDICAL HISTORY   Past Medical History:  Diagnosis Date  . Chronic respiratory failure with hypoxia (HCC)    2L [place] continuous  . COVID-19   . Diabetes mellitus without complication ([place])   . Diverticulitis   . Follicular lymphoma of intra-abdominal lymph nodes ([place]) 02/15/2019  . ILD (interstitial lung disease) ([place])      SURGICAL HISTORY   Past Surgical History:  Procedure Laterality Date  . CHOLECYSTECTOMY    . COLON RESECTION     DUE TO DIVERTICULITIS  . FLEXIBLE BRONCHOSCOPY N/A 12/02/2019   Procedure: FLEXIBLE BRONCHOSCOPY;  Surgeon: Ottie Glazier, MD;  Location: [facility];  Service: Thoracic;  Laterality: N/A;  . PORTA CATH INSERTION N/A 03/11/2019   Procedure: PORTA CATH INSERTION;  Surgeon: Algernon Huxley, MD;  Location: [facility];  Service: Cardiovascular;  Laterality: N/A;  . PORTA CATH REMOVAL Right 02/09/2021   Procedure: PORTA CATH REMOVAL;  Surgeon: Katha Cabal, MD;  Location: [facility];  Service: Cardiovascular;  Laterality: Right;  . PULMONARY THROMBECTOMY N/A 01/29/2019   Procedure: PULMONARY THROMBECTOMY;  Surgeon: Algernon Huxley, MD;  Location: [facility];  Service: Cardiovascular;  Laterality: N/A;     FAMILY HISTORY   Family History  Problem Relation Age of Onset  . Diabetes Brother      SOCIAL HISTORY   Social History   Tobacco Use  . Smoking status: Never  . Smokeless tobacco: Never  Vaping Use  . Vaping Use: Never used  Substance Use Topics  . Alcohol use: Yes    Alcohol/week: 0.0 - 2.0 standard drinks    Comment: occasional  . Drug use: Never     MEDICATIONS    Home Medication:    Current Medication:  Current Facility-Administered Medications:  .  acetaminophen (TYLENOL) tablet 650 mg, 650 mg, Oral, Q6H PRN  **OR** acetaminophen (TYLENOL) suppository 650 mg, 650 mg, Rectal, Q6H PRN, Oswald Hillock, RPH .  ALPRAZolam (XANAX) tablet 0.25 mg, 0.25 mg, Oral, QHS PRN, Sharen Hones, MD .  ALPRAZolam Duanne Moron) tablet 0.25 mg, 0.25 mg, Oral, q morning, Sharen Hones, MD, 0.25 mg at 02/13/21 0909 .  apixaban (ELIQUIS) tablet 5 mg, 5 mg, Oral, BID, Beers, Shanon Brow, RPH, 5 mg at 02/13/21 9528 .  Chlorhexidine Gluconate Cloth 2 % PADS 6 each, 6 each, Topical, Daily, Kathie Dike, MD, 6 each at 02/09/21 0828 .  dexamethasone (DECADRON) tablet 10 mg, 10 mg, Oral, Q12H, Ottie Glazier, MD, 10 mg at 02/13/21 0902 .  digoxin (LANOXIN) tablet 0.125 mg, 0.125 mg, Oral, Daily, Memon, Jolaine Artist, MD, 0.125 mg at 02/13/21 0903 .  diltiazem (CARDIZEM CD) 24 hr capsule 120 mg, 120 mg, Oral, Daily, Ulanda Tackett, MD, 120 mg at 02/13/21 0904 .  diltiazem (CARDIZEM) injection 10 mg, 10 mg, Intravenous, Q6H PRN, Wyvonnia Dusky, MD, 10 mg at 01/24/21 2328 .  docusate sodium (COLACE) capsule 200 mg, 200 mg, Oral, BID, Wyvonnia Dusky, MD, 200 mg at 02/13/21 4132 .  feeding supplement (NEPRO CARB STEADY) liquid 237 mL, 237 mL, Oral, TID BM, Memon, Jehanzeb, MD, Last Rate: 0 mL/hr at 01/21/21 2130, 237 mL at 02/12/21 2302 .  furosemide (LASIX) injection 20 mg, 20 mg, Intravenous, Daily, Lanney Gins, Ayah Cozzolino, MD, 20 mg at 02/13/21 0903 .  guaiFENesin (MUCINEX) 12 hr tablet 1,200 mg, 1,200 mg, Oral, BID, Kathie Dike, MD, 1,200 mg at 02/13/21 0903 .  guaiFENesin-dextromethorphan (ROBITUSSIN DM) 100-10 MG/5ML syrup 5 mL, 5 mL, Oral, Q4H  PRN, Kathie Dike, MD, 5 mL at 02/04/21 0850 .  HYDROmorphone (DILAUDID) injection 1 mg, 1 mg, Intravenous, Once PRN, Stegmayer, Kimberly A, PA-C .  insulin aspart (novoLOG) injection 0-15 Units, 0-15 Units, Subcutaneous, TID WC, Sharen Hones, MD, 11 Units at 02/13/21 0851 .  insulin aspart (novoLOG) injection 0-5 Units, 0-5 Units, Subcutaneous, QHS, Sharen Hones, MD, 3 Units at 02/11/21  2132 .  insulin aspart (novoLOG) injection 10 Units, 10 Units, Subcutaneous, TID WC, Loletha Grayer, MD, 10 Units at 02/13/21 [phone] .  insulin glargine-yfgn (SEMGLEE) injection 16 Units, 16 Units, Subcutaneous, BID, Jennye Boroughs, MD, 16 Units at 02/13/21 0904 .  ipratropium-albuterol (DUONEB) 0.5-2.5 (3) MG/3ML nebulizer solution 3 mL, 3 mL, Nebulization, TID, Jennye Boroughs, MD, 3 mL at 02/13/21 0838 .  lactulose (CHRONULAC) 10 GM/15ML solution 30 g, 30 g, Oral, BID PRN, Leslye Peer, Richard, MD .  levalbuterol [facility]) nebulizer solution 1.25 mg, 1.25 mg, Nebulization, Q4H PRN, Howerter, Justin B, DO, 1.25 mg at 01/17/21 2011 .  [START ON 02/14/2021] methylPREDNISolone sodium succinate (SOLU-MEDROL) 40 mg/mL injection 10 mg, 10 mg, Intravenous, Q12H, Johnanthony Wilden, MD .  morphine 2 MG/ML injection 1 mg, 1 mg, Intravenous, Q3H PRN, Ottie Glazier, MD, 1 mg at 02/11/21 1827 .  multivitamin with minerals tablet 1 tablet, 1 tablet, Oral, Daily, Kathie Dike, MD, 1 tablet at 02/12/21 1249 .  mycophenolate (CELLCEPT) capsule 750 mg, 750 mg, Oral, BID, Lanney Gins, Romayne Ticas, MD, 750 mg at 02/13/21 0855 .  ondansetron (ZOFRAN) injection 4 mg, 4 mg, Intravenous, Q6H PRN, Stegmayer, Kimberly A, PA-C .  polyethylene glycol (MIRALAX / GLYCOLAX) packet 17 g, 17 g, Oral, Daily, Leslye Peer, Richard, MD, 17 g at 02/13/21 2694 .  simethicone (MYLICON) chewable tablet 80 mg, 80 mg, Oral, QID PRN, Loletha Grayer, MD, 80 mg at 01/29/21 1411 .  sodium chloride (OCEAN) 0.65 % nasal spray 1 spray, 1 spray, Each Nare, PRN, Loletha Grayer, MD, 1 spray at 01/30/21 2142 .  sodium chloride flush (NS) 0.9 % injection 10-40 mL, 10-40 mL, Intracatheter, Q12H, Memon, Jolaine Artist, MD, 10 mL at 02/13/21 0905 .  sodium chloride flush (NS) 0.9 % injection 10-40 mL, 10-40 mL, Intracatheter, PRN, Kathie Dike, MD, 10 mL at 01/24/21 0915 .  sulfamethoxazole-trimethoprim (BACTRIM) 400-80 MG per tablet 1 tablet, 1 tablet, Oral, Q12H,  Ottie Glazier, MD, 1 tablet at 02/13/21 0855 .  traZODone (DESYREL) tablet 50 mg, 50 mg, Oral, QHS PRN, Sharion Settler, NP, 50 mg at 02/10/21 2202    ALLERGIES   Penicillins     REVIEW OF SYSTEMS    Review of Systems:  Gen:  Denies  fever, sweats, chills weigh loss  HEENT: Denies blurred vision, double vision, ear pain, eye pain, hearing loss, nose bleeds, sore throat Cardiac:  No dizziness, chest pain or heaviness, chest tightness,edema Resp:  admits to severe dyspnea Gi: Denies swallowing difficulty, stomach pain, nausea or vomiting, diarrhea, constipation, bowel incontinence Gu:  Denies bladder incontinence, burning urine Ext:   Denies Joint pain, stiffness or swelling Skin: Denies  skin rash, easy bruising or bleeding or hives Endoc:  Denies polyuria, polydipsia , polyphagia or weight change Psych:   admits to axniety  Other:  All other systems negative   VS: BP 125/87 (BP Location: Right Arm)   Pulse 88   Temp 98 F (36.7 C) (Oral)   Resp 18   Ht 6' (1.829 m)   Wt 71.4 kg   SpO2 94%   BMI 21.35 kg/m      PHYSICAL  EXAM    GENERAL:NAD, no fevers, chills, no weakness no fatigue HEAD: Normocephalic, atraumatic.  EYES: Pupils equal, round, reactive to light. Extraocular muscles intact. No scleral icterus.  MOUTH: Moist mucosal membrane. Dentition intact. No abscess noted.  EAR, NOSE, THROAT: Clear without exudates. No external lesions.  NECK: Supple. No thyromegaly. No nodules. No JVD.  PULMONARY: Bilateral rhonchi improved from previous CARDIOVASCULAR: S1 and S2. Regular rate and rhythm. No murmurs, rubs, or gallops. No edema. Pedal pulses 2+ bilaterally.  GASTROINTESTINAL: Soft, nontender, nondistended. No masses. Positive bowel sounds. No hepatosplenomegaly.  MUSCULOSKELETAL: No swelling, clubbing, or edema. Range of motion full in all extremities.  NEUROLOGIC: Cranial nerves II through XII are intact. No gross focal neurological deficits. Sensation  intact. Reflexes intact.  SKIN: No ulceration, lesions, rashes, or cyanosis. Skin warm and dry. Turgor intact.  PSYCHIATRIC: Mood, affect within normal limits. The patient is awake, alert and oriented x 3. Insight, judgment intact.       IMAGING    CT Chest High Resolution  Result Date: 02/07/2021 CLINICAL DATA:  Inpatient. Interstitial lung disease. Worsening dyspnea. Chronic hypoxemia post COVID-19. History of lymphoma treated with Rituxan. EXAM: CT CHEST WITHOUT CONTRAST TECHNIQUE: Multidetector CT imaging of the chest was performed following the standard protocol without intravenous contrast. High resolution imaging of the lungs, as well as inspiratory and expiratory imaging, was performed. COMPARISON:  02/05/2021 chest radiograph. 01/13/2021 chest CT angiogram. FINDINGS: Cardiovascular: Normal heart size. No significant pericardial effusion/thickening. Right internal jugular central venous catheter terminates at the cavoatrial junction. Normal course and caliber of the thoracic aorta. Stable dilated main pulmonary artery (3.7 cm diameter). Mediastinum/Nodes: No discrete thyroid nodules. Unremarkable esophagus. No pathologically enlarged axillary, mediastinal or hilar lymph nodes, noting limited sensitivity for the detection of hilar adenopathy on this noncontrast study. Lungs/Pleura: No pneumothorax. No pleural effusion. Severe patchy confluent peribronchovascular and peripheral ground-glass opacity, reticulation, traction bronchiectasis, architectural distortion and volume loss in both lungs with a basilar predominance. The ground-glass opacities have mildly improved since 01/13/2021 chest CT. The bronchiectasis, distortion and volume loss appear slightly worsened. No significant lobular air trapping or evidence of tracheobronchomalacia on the expiration sequence. No acute consolidative airspace disease, lung masses or significant pulmonary nodules. No frank honeycombing. Upper abdomen: Chronic  mild elevation of the right hemidiaphragm. Musculoskeletal: No aggressive appearing focal osseous lesions. Mild thoracic spondylosis. IMPRESSION: 1. Spectrum of findings compatible with severe basilar predominant fibrotic interstitial lung disease with ground-glass and no frank honeycombing predominance. Ground-glass opacities have slightly improved and bronchiectasis and other findings of fibrosis have slightly worsened since recent 01/13/2021 chest CT angiogram study. Findings are most compatible with a severe rapidly progressive postinflammatory fibrosis or fibrotic NSIP pattern. Findings are suggestive of an alternative diagnosis (not UIP) per consensus guidelines: Diagnosis of Idiopathic Pulmonary Fibrosis: An Official ATS/ERS/JRS/ALAT Clinical Practice Guideline. [place], Iss 5, [phone], Dec 31 2016. 2. Stable dilated main pulmonary artery, suggesting chronic pulmonary arterial hypertension. 3. Chronic mild elevation of the right hemidiaphragm. Electronically Signed   By: Ilona Sorrel M.D.   On: 02/07/2021 17:29   PERIPHERAL VASCULAR CATHETERIZATION  Result Date: 02/09/2021 See surgical note for result.  DG Chest Port 1 View  Result Date: 02/05/2021 CLINICAL DATA:  Bilateral pulmonary infiltrates on chest x-ray. EXAM: PORTABLE CHEST 1 VIEW COMPARISON:  Chest radiograph 02/01/2021 FINDINGS: Power injectable right IJ port central venous catheter tip projects at the level of the superior cavoatrial junction. Unchanged low lung volumes with lower  lobe predominant interstitial opacities, left greater than right. No new focal consolidation, pleural effusion, or pneumothorax. Heart is normal in size. Surgical clips in the right upper abdomen. IMPRESSION: Bilateral interstitial airspace opacities are unchanged compared to 02/01/2021. No new focal consolidation, pleural effusion, or pneumothorax. Electronically Signed   By: Ileana Roup M.D.   On: 02/05/2021 10:59   DG Chest Port 1  View  Result Date: 02/01/2021 CLINICAL DATA:  Shortness of breath, edema EXAM: PORTABLE CHEST 1 VIEW COMPARISON:  01/30/2021 FINDINGS: Stable positioning of right IJ approach Port-A-Cath. Heart size within normal limits. Low lung volumes. Slight interval worsening of bilateral airspace opacities, left worse than right. No pleural effusion or pneumothorax. IMPRESSION: Slight interval worsening of bilateral airspace opacities, left worse than right. Electronically Signed   By: Davina Poke D.O.   On: 02/01/2021 14:37   DG Chest Port 1 View  Result Date: 01/30/2021 CLINICAL DATA:  Acute on chronic hypoxemic respiratory failure. EXAM: PORTABLE CHEST 1 VIEW COMPARISON:  January 28, 2021 FINDINGS: No pneumothorax. Stable cardiomediastinal silhouette and right Port-A-Cath. Left-greater-than-right pulmonary opacities/infiltrates. No other interval changes. IMPRESSION: 1. Persistent low lung volumes and bilateral pulmonary infiltrates. No interval changes. Electronically Signed   By: Dorise Bullion III M.D.   On: 01/30/2021 08:36   DG Chest Port 1 View  Result Date: 01/28/2021 CLINICAL DATA:  Shortness of breath. EXAM: PORTABLE CHEST 1 VIEW COMPARISON:  Multiple recent chest films. FINDINGS: The right IJ power port is stable. Persistent low lung volumes and bilateral pulmonary infiltrates. No definite pleural effusion or pneumothorax. IMPRESSION: Persistent low lung volumes and bilateral pulmonary infiltrates. Electronically Signed   By: Marijo Sanes M.D.   On: 01/28/2021 16:03   DG Chest Port 1 View  Result Date: 01/26/2021 CLINICAL DATA:  Shortness of breath, abnormal chest x-ray EXAM: PORTABLE CHEST 1 VIEW COMPARISON:  01/24/2021 FINDINGS: Right Port-A-Cath remains in place, unchanged. Low lung volumes with bilateral airspace opacities, most pronounced in the left perihilar and lower lobe, unchanged since prior study. No visible effusions or pneumothorax. Heart is normal size. IMPRESSION: Low lung  volumes with bilateral airspace disease, left-greater-than-right. No significant change. Electronically Signed   By: Rolm Baptise M.D.   On: 01/26/2021 09:42   DG Chest Port 1 View  Result Date: 01/24/2021 CLINICAL DATA:  Follow-up multifocal pneumonia. EXAM: PORTABLE CHEST 1 VIEW COMPARISON:  01/22/2021 and older exams. FINDINGS: Mild interval decrease in the airspace lung opacities, most evident on the left. Lung volumes remain low. No new lung abnormalities. No visualized pleural effusion.  No pneumothorax. Right internal jugular Port-A-Cath is stable. IMPRESSION: 1. Mild interval improvement in lung aeration with decreased airspace lung opacities, most notably on the left. Electronically Signed   By: Lajean Manes M.D.   On: 01/24/2021 07:57   DG Chest Port 1 View  Result Date: 01/22/2021 CLINICAL DATA:  Hypoxia, shortness of breath. EXAM: PORTABLE CHEST 1 VIEW COMPARISON:  January 20, 2021. FINDINGS: Stable cardiomediastinal silhouette. Right internal jugular Port-A-Cath is unchanged in position. Hypoinflation of the lungs is noted. Stable bilateral lung opacities are noted, left greater than right, most consistent with multifocal pneumonia. Bony thorax is unremarkable. IMPRESSION: Hypoinflation of the lungs. Stable bilateral lung opacities are noted, left greater than right, most consistent with multifocal pneumonia. Electronically Signed   By: Marijo Conception M.D.   On: 01/22/2021 19:26   DG Chest Port 1 View  Result Date: 01/20/2021 CLINICAL DATA:  Pneumonitis EXAM: PORTABLE CHEST 1 VIEW COMPARISON:  01/19/2021 FINDINGS: Shallow inspiration. Patchy infiltrates in the left lung and right lung base, similar to prior study and likely multifocal pneumonia. No pleural effusions. No pneumothorax. Mediastinal contours appear intact. Heart size is normal. Power port type central venous catheter on the right with tip over the cavoatrial junction region. Surgical clips in the right upper quadrant.  IMPRESSION: Shallow inspiration. Patchy infiltrates in both lungs, likely multifocal pneumonia, without change. Electronically Signed   By: Lucienne Capers M.D.   On: 01/20/2021 03:56   DG Chest Port 1 View  Result Date: 01/19/2021 CLINICAL DATA:  Abnormality on previous chest x-ray in a 60 year old male. EXAM: PORTABLE CHEST 1 VIEW COMPARISON:  CT from January 13, 2021, chest x-ray January 18, 2021. FINDINGS: RIGHT-sided Port-A-Cath terminates at the caval to atrial junction. Trachea is midline. Cardiomediastinal contours are stable. Interstitial and airspace opacities throughout the chest worse in the LEFT mid to upper chest, RIGHT upper chest lung bases bilaterally with similar appearance. On limited assessment there is no acute skeletal process. EKG leads project over the chest. IMPRESSION: No interval change in the appearance of the chest since previous imaging with continued decreased lung volumes and patchy bilateral opacities. Electronically Signed   By: Zetta Bills M.D.   On: 01/19/2021 07:59   DG Chest Port 1 View  Result Date: 01/18/2021 CLINICAL DATA:  Respiratory distress EXAM: PORTABLE CHEST 1 VIEW COMPARISON:  01/17/2021 FINDINGS: Cardiac shadow is stable. Right-sided chest wall port is again seen. Lungs are hypoinflated. Patchy airspace opacities again identified in both lungs left greater than right but stable from the prior exam. IMPRESSION: Patchy airspace opacities bilaterally stable from the prior exam. Electronically Signed   By: Inez Catalina M.D.   On: 01/18/2021 17:24   DG Chest Port 1 View  Result Date: 01/17/2021 CLINICAL DATA:  Worsening shortness of breath. EXAM: PORTABLE CHEST 1 VIEW COMPARISON:  01/14/2021 FINDINGS: 0826 hours. Low volume film. Patchy airspace disease again noted in both lungs, left greater than right. Cardiopericardial silhouette is at upper limits of normal for size. Right Port-A-Cath again noted. Telemetry leads overlie the chest. IMPRESSION: Low  volume film with patchy bilateral airspace disease, left greater than right. No substantial change. Electronically Signed   By: Misty Stanley M.D.   On: 01/17/2021 08:53   DG Chest Port 1 View  Result Date: 01/14/2021 CLINICAL DATA:  Shortness of breath.  History of cancer.  Diabetes. EXAM: PORTABLE CHEST 1 VIEW.  Patient is rotated. COMPARISON:  Chest x-ray 01/13/2021, CT chest 01/13/2021 FINDINGS: Accessed right chest wall Port-A-Cath in stable position. The heart and mediastinal contours are within normal limits. Low lung volumes with persistent diffuse patchy airspace opacities. No pleural effusion. No pneumothorax. No acute osseous abnormality. Right upper quadrant surgical clips. IMPRESSION: Low lung volumes with persistent diffuse patchy airspace opacities. Electronically Signed   By: Iven Finn M.D.   On: 01/14/2021 21:08           ASSESSMENT/PLAN   Acute on hypoxemic respiratory failure Due to acute exacerbation of Chronic lung disease/ILD with Critical illness Myopathy associated respiratory failure - present on admission  - COVID19 negative   - supplemental O2 during my evaluation 10l/min>>8L>>HFNC 7L -Respiratory viral panel-negative  -serum fungitell-negative  -legionella ab-negative  -nasal MRSA PCR-negative -Procalcitonin trend reviewed -strep pneumoniae ur AG -Histoplasma Ur Ag-negative -sputum resp cultures -AFB sputum expectorated specimen -sputum cytology  -completed  Cefepime course for cap -Reduced Solumedrol to 40 BID - 01/15/21>>50 BID>>40 BID>>30 BID>>20 BID>>pred 50>>soluemdrol  40 iv bid please watch surgars -reviewed pertinent imaging with patient today - ESR and CRP are elevated but improving -PT/OT for d/c planning  -please encourage patient to use incentive spirometer few times each hour while hospitalized.   -continue Cellcept 250 bid >>500 bid >>750BID -UOP is adequate -Discussed goal of care - recommend DNR patient is in agreement but still  discussing with wife, palliative care service to follow  -lasix 20 daily PO     Thank you for allowing me to participate in the care of this patient.   Patient/Family are satisfied with care plan and all questions have been answered.   This document was prepared using Dragon voice recognition software and may include unintentional dictation errors.     Ottie Glazier, M.D.  Division of [place]

## 2021-02-13 NOTE — Progress Notes (Signed)
Progress Note    Edward Atkinson  GSU:[id number] DOB: Nov 07, 1960  DOA: 01/13/2021 PCP: Rusty Aus, MD      Brief Narrative:    Medical records reviewed and are as summarized below:  Edward Atkinson is a 60 y.o. 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       Assessment/Plan:   Principal Problem:   Acute on chronic respiratory failure with hypoxia (HCC) Active Problems:   Type 2 diabetes mellitus with hypoglycemia without coma, without long-term current use of insulin (HCC)   Follicular lymphoma (HCC)   SOB (shortness of breath)   Interstitial lung disease (HCC)   Severe sepsis (HCC)   CAP (community acquired pneumonia)   Elevated troponin   Protein calorie malnutrition (HCC)   Protein-calorie malnutrition, severe   AF (paroxysmal atrial fibrillation) (HCC)   Thrombocytosis   Leukocytosis   Pressure injury of skin   Hyponatremia   Uncontrolled type 2 diabetes mellitus with hyperglycemia (HCC)   Nutrition Problem: Severe Malnutrition Etiology: chronic illness (lymphoma, ILD)  Signs/Symptoms: severe fat depletion, severe muscle depletion, percent weight loss Percent weight loss: 16 %   Body mass index is 21.35 kg/m.  Acute on chronic hypoxemic respiratory failure, exacerbation of interstitial lung disease, bronchiectasis: He is on 3 L/min oxygen which is his baseline oxygen requirement.  IV Solu-Medrol has been decreased and he has been started on oral dexamethasone by the pulmonologist.  Discontinue IV Lasix.  Follow-up with pulmonologist for further recommendations.   Type II DM with severe hyperglycemia: Continue insulin glargine and NovoLog..  Increase insulin glargine from 16 units to 18 units twice daily..  Continue NovoLog.  Paroxysmal atrial fibrillation: Continue Eliquis  Follicular lymphoma: Completed chemotherapy.  S/p removal of  Port-A-Cath on 02/09/2021.  Immunocompromised state: He is on Bactrim for PCP prophylaxis   Diet Order             Diet Carb Modified Fluid consistency: Thin; Room service appropriate? Yes  Diet effective now                      Consultants: Pulmonologist Palliative care  Procedures: S/p removal of Port-A-Cath on 02/09/2021    Medications:    ALPRAZolam  0.25 mg Oral q morning   apixaban  5 mg Oral BID   Chlorhexidine Gluconate Cloth  6 each Topical Daily   dexamethasone  10 mg Oral Q12H   digoxin  0.125 mg Oral Daily   diltiazem  120 mg Oral Daily   docusate sodium  200 mg Oral BID   feeding supplement (NEPRO CARB STEADY)  237 mL Oral TID BM   furosemide  20 mg Intravenous Daily   guaiFENesin  1,200 mg Oral BID   insulin aspart  0-15 Units Subcutaneous TID WC   insulin aspart  0-5 Units Subcutaneous QHS   insulin aspart  10 Units Subcutaneous TID WC   insulin glargine-yfgn  16 Units Subcutaneous BID   ipratropium-albuterol  3 mL Nebulization TID   [START ON 02/14/2021] methylPREDNISolone (SOLU-MEDROL) injection  10 mg Intravenous Q12H   multivitamin with minerals  1 tablet Oral Daily   mycophenolate  750 mg Oral BID   polyethylene glycol  17 g Oral Daily   sodium chloride flush  10-40 mL Intracatheter Q12H   sulfamethoxazole-trimethoprim  1 tablet Oral Q12H   Continuous Infusions:   Anti-infectives (From admission, onward)  Start     Dose/Rate Route Frequency Ordered Stop   01/31/21 1000  azithromycin (ZITHROMAX) tablet 250 mg  Status:  Discontinued       See Hyperspace for full Linked Orders Report.   250 mg Oral Daily 01/30/21 1532 01/31/21 1218   01/30/21 1630  azithromycin (ZITHROMAX) tablet 500 mg       See Hyperspace for full Linked Orders Report.   500 mg Oral Daily 01/30/21 1532 01/30/21 1617   01/30/21 1630  piperacillin-tazobactam (ZOSYN) IVPB 3.375 g  Status:  Discontinued        3.375 g 12.5 mL/hr over 240 Minutes Intravenous Every 8  hours 01/30/21 1540 01/31/21 1218   01/23/21 2200  sulfamethoxazole-trimethoprim (BACTRIM) 400-80 MG per tablet 1 tablet        1 tablet Oral Every 12 hours 01/23/21 1730     01/19/21 2000  sulfamethoxazole-trimethoprim (BACTRIM) 400-80 MG per tablet 1 tablet  Status:  Discontinued        1 tablet Oral Daily 01/19/21 1759 01/23/21 1730   01/15/21 2200  acyclovir (ZOVIRAX) tablet 400 mg  Status:  Discontinued        400 mg Oral 2 times daily 01/15/21 1558 01/15/21 1559   01/15/21 1645  acyclovir (ZOVIRAX) 200 MG capsule 400 mg  Status:  Discontinued        400 mg Oral 2 times daily 01/15/21 1558 02/02/21 1344   01/15/21 1515  acyclovir (ZOVIRAX) tablet 400 mg  Status:  Discontinued        400 mg Oral 2 times daily 01/15/21 1424 01/15/21 1556   01/14/21 1900  azithromycin (ZITHROMAX) 500 mg in sodium chloride 0.9 % 250 mL IVPB        500 mg 250 mL/hr over 60 Minutes Intravenous Every 24 hours 01/13/21 1835 01/20/21 2359   01/14/21 1900  cefTRIAXone (ROCEPHIN) 2 g in sodium chloride 0.9 % 100 mL IVPB  Status:  Discontinued        2 g 200 mL/hr over 30 Minutes Intravenous Every 24 hours 01/13/21 1835 01/13/21 2251   01/13/21 2345  ceFEPIme (MAXIPIME) 2 g in sodium chloride 0.9 % 100 mL IVPB  Status:  Discontinued        2 g 200 mL/hr over 30 Minutes Intravenous Every 8 hours 01/13/21 2309 01/19/21 2035   01/13/21 1800  azithromycin (ZITHROMAX) 500 mg in sodium chloride 0.9 % 250 mL IVPB        500 mg 250 mL/hr over 60 Minutes Intravenous  Once 01/13/21 1746 01/13/21 1958   01/13/21 1800  cefTRIAXone (ROCEPHIN) 1 g in sodium chloride 0.9 % 100 mL IVPB        1 g 200 mL/hr over 30 Minutes Intravenous  Once 01/13/21 1746 01/13/21 2054              Family Communication/Anticipated D/C date and plan/Code Status   DVT prophylaxis: SCDs Start: 01/13/21 1831 apixaban (ELIQUIS) tablet 5 mg     Code Status: Full Code  Family Communication: None Disposition Plan:    Status is:  Inpatient  Remains inpatient appropriate because:Inpatient level of care appropriate due to severity of illness  Dispo: The patient is from: Home              Anticipated d/c is to: Home              Patient currently is not medically stable to d/c.   Difficult to place patient No  Subjective:   No new complaints.  He still feels short of breath with minimal exertion.  Objective:    Vitals:   02/13/21 0000 02/13/21 0435 02/13/21 1202 02/13/21 1630  BP: (!) 151/85 125/87 (!) 163/86 126/81  Pulse: 90 88 (!) 102 93  Resp: 17 18 (!) 22 (!) 22  Temp: 98.3 F (36.8 C) 98 F (36.7 C) 98.6 F (37 C) 97.7 F (36.5 C)  TempSrc: Oral Oral    SpO2: 99% 94% 90% 95%  Weight:      Height:       No data found.   Intake/Output Summary (Last 24 hours) at 02/13/2021 1709 Last data filed at 02/13/2021 1516 Gross per 24 hour  Intake 720 ml  Output 1500 ml  Net -780 ml   Filed Weights   02/09/21 0613 02/11/21 0412 02/12/21 0355  Weight: 67.9 kg 71.6 kg 71.4 kg    Exam:  GEN: NAD SKIN: No rash EYES: EOMI ENT: MMM CV: RRR PULM: Bibasilar rales.  No wheezing. ABD: soft, ND, NT, +BS CNS: AAO x 3, non focal EXT: No edema or tenderness        Data Reviewed:   I have personally reviewed following labs and imaging studies:  Labs: Labs show the following:   Basic Metabolic Panel: Recent Labs  Lab 02/09/21 0700 02/10/21 0534  NA 135 133*  K 4.5 4.5  CL 94* 93*  CO2 32 32  GLUCOSE 277* 384*  BUN 20 21  CREATININE 0.76 0.63  CALCIUM 9.1 8.7*  MG  --  2.0   GFR Estimated Creatinine Clearance: 96.7 mL/min (by C-G formula based on SCr of 0.63 mg/dL). Liver Function Tests: No results for input(s): AST, ALT, ALKPHOS, BILITOT, PROT, ALBUMIN in the last 168 hours. No results for input(s): LIPASE, AMYLASE in the last 168 hours. No results for input(s): AMMONIA in the last 168 hours. Coagulation profile No results for input(s): INR, PROTIME in the  last 168 hours.  CBC: Recent Labs  Lab 02/10/21 0534 02/13/21 0623  WBC 7.1 5.8  HGB 10.1* 11.5*  HCT 29.7* 33.3*  MCV 86.6 86.3  PLT 186 238   Cardiac Enzymes: No results for input(s): CKTOTAL, CKMB, CKMBINDEX, TROPONINI in the last 168 hours. BNP (last 3 results) No results for input(s): PROBNP in the last 8760 hours. CBG: Recent Labs  Lab 02/12/21 1636 02/12/21 2108 02/13/21 0805 02/13/21 1201 02/13/21 1627  GLUCAP 191* 95 350* 247* 201*   D-Dimer: No results for input(s): DDIMER in the last 72 hours. Hgb A1c: No results for input(s): HGBA1C in the last 72 hours. Lipid Profile: No results for input(s): CHOL, HDL, LDLCALC, TRIG, CHOLHDL, LDLDIRECT in the last 72 hours. Thyroid function studies: No results for input(s): TSH, T4TOTAL, T3FREE, THYROIDAB in the last 72 hours.  Invalid input(s): FREET3 Anemia work up: No results for input(s): VITAMINB12, FOLATE, FERRITIN, TIBC, IRON, RETICCTPCT in the last 72 hours. Sepsis Labs: Recent Labs  Lab 02/10/21 0534 02/13/21 0623  WBC 7.1 5.8    Microbiology Recent Results (from the past 240 hour(s))  Culture, BAL-quantitative w Gram Stain     Status: None   Collection Time: 02/08/21  7:50 AM   Specimen: SPU; Respiratory  Result Value Ref Range Status   Specimen Description   Final    SPUTUM Performed at [facility], [address] [place], [place] [address]    Special Requests   Final    NONE Performed at [facility], [address] [place], [place] [address]    Gram Stain   Final    NO SQUAMOUS EPITHELIAL CELLS SEEN NO WBC SEEN FEW GRAM POSITIVE RODS FEW GRAM POSITIVE COCCI    Culture   Final    FEW Normal respiratory flora-no Staph aureus or Pseudomonas seen Performed at [facility], [address] [place],  [address]    Report Status 02/10/2021 FINAL  Final    Procedures and diagnostic studies:  DG Chest Port 1 View  Result Date: 02/13/2021 CLINICAL DATA:   Atelectasis EXAM: PORTABLE CHEST 1 VIEW COMPARISON:  02/05/2021 FINDINGS: Low lung volumes with similar appearance of interstitial lung disease, left greater than right. No pleural effusion. Right chest wall port has been removed. Stable cardiomediastinal contours. IMPRESSION: Interval removal of chest port. Interstitial lung disease with similar lung aeration to prior study. Electronically Signed   By: Macy Mis M.D.   On: 02/13/2021 13:21               LOS: 31 days   Gertrude Bucks  [facility]   Pager on [email or web address]. If 7PM-7AM, please contact night-coverage at [email or web address]     02/13/2021, 5:09 PM

## 2021-02-14 DIAGNOSIS — J9621 Acute and chronic respiratory failure with hypoxia: Secondary | ICD-10-CM | POA: Diagnosis not present

## 2021-02-14 LAB — GLUCOSE, CAPILLARY
Glucose-Capillary: 302 mg/dL — ABNORMAL HIGH (ref 70–99)
Glucose-Capillary: 205 mg/dL — ABNORMAL HIGH (ref 70–99)
Glucose-Capillary: 232 mg/dL — ABNORMAL HIGH (ref 70–99)
Glucose-Capillary: 178 mg/dL — ABNORMAL HIGH (ref 70–99)

## 2021-02-14 LAB — C-REACTIVE PROTEIN: CRP: 0.6 mg/dL (ref <1.0)

## 2021-02-14 LAB — SEDIMENTATION RATE: Sed Rate: 37 mm/hr — ABNORMAL HIGH (ref 0–20)

## 2021-02-14 MED ORDER — INSULIN GLARGINE-YFGN 100 UNIT/ML ~~LOC~~ SOLN
18.0000 [IU] | Freq: Two times a day (BID) | SUBCUTANEOUS | Status: DC
  Administered 2021-02-14 – 2021-02-15 (×2): 18 [IU] via SUBCUTANEOUS
  Filled 2021-02-14 (×4): qty 0.18

## 2021-02-14 MED ORDER — INSULIN ASPART 100 UNIT/ML IJ SOLN
12.0000 [IU] | Freq: Three times a day (TID) | INTRAMUSCULAR | Status: DC
  Administered 2021-02-14 – 2021-02-15 (×6): 12 [IU] via SUBCUTANEOUS

## 2021-02-14 NOTE — Progress Notes (Signed)
Progress Note    Edward Atkinson  HFW:[id number] DOB: 10-21-1960  DOA: 01/13/2021 PCP: Rusty Aus, MD      Brief Narrative:    Medical records reviewed and are as summarized below:  Edward Atkinson is a 60 y.o. male with history of follicular lymphoma, OYDXA-12 infection, interstitial lung disease and chronic respiratory failure, and type 2 diabetes mellitus.       Assessment/Plan:   Principal Problem:   Acute on chronic respiratory failure with hypoxia (HCC) Active Problems:   Type 2 diabetes mellitus with hypoglycemia without coma, without long-term current use of insulin (HCC)   Follicular lymphoma (HCC)   SOB (shortness of breath)   Interstitial lung disease (HCC)   Severe sepsis (HCC)   CAP (community acquired pneumonia)   Elevated troponin   Protein calorie malnutrition (HCC)   Protein-calorie malnutrition, severe   AF (paroxysmal atrial fibrillation) (HCC)   Thrombocytosis   Leukocytosis   Pressure injury of skin   Hyponatremia   Uncontrolled type 2 diabetes mellitus with hyperglycemia (HCC)   Nutrition Problem: Severe Malnutrition Etiology: chronic illness (lymphoma, ILD)  Signs/Symptoms: severe fat depletion, severe muscle depletion, percent weight loss Percent weight loss: 16 %   Body mass index is 20.36 kg/m.  Acute on chronic hypoxemic respiratory failure, exacerbation of interstitial lung disease, bronchiectasis: Oxygen requirement went up from 2 L/min to 4 L/min oxygen via nasal cannula.  Continue IV Solu-Medrol and oral dexamethasone per pulmonologist's recommendation.  Type II DM with severe hyperglycemia: Increase insulin glargine from 16 to 18 units twice daily.  Increase NovoLog from 10 units to 12 units 3 times daily with meals.   Paroxysmal atrial fibrillation: Continue Eliquis  Follicular lymphoma: Completed chemotherapy.  S/p removal of Port-A-Cath on 02/09/2021.  Immunocompromised state: He is on Bactrim for PCP  prophylaxis   Diet Order             Diet Carb Modified Fluid consistency: Thin; Room service appropriate? Yes  Diet effective now                      Consultants: Pulmonologist Palliative care  Procedures: S/p removal of Port-A-Cath on 02/09/2021    Medications:    ALPRAZolam  0.25 mg Oral q morning   apixaban  5 mg Oral BID   Chlorhexidine Gluconate Cloth  6 each Topical Daily   dexamethasone  10 mg Oral Q12H   digoxin  0.125 mg Oral Daily   diltiazem  120 mg Oral Daily   docusate sodium  200 mg Oral BID   feeding supplement (NEPRO CARB STEADY)  237 mL Oral TID BM   guaiFENesin  1,200 mg Oral BID   insulin aspart  0-15 Units Subcutaneous TID WC   insulin aspart  0-5 Units Subcutaneous QHS   insulin aspart  12 Units Subcutaneous TID WC   insulin glargine-yfgn  16 Units Subcutaneous BID   ipratropium-albuterol  3 mL Nebulization TID   methylPREDNISolone (SOLU-MEDROL) injection  10 mg Intravenous Q12H   multivitamin with minerals  1 tablet Oral Daily   mycophenolate  750 mg Oral BID   polyethylene glycol  17 g Oral Daily   sodium chloride flush  10-40 mL Intracatheter Q12H   sulfamethoxazole-trimethoprim  1 tablet Oral Q12H   Continuous Infusions:   Anti-infectives (From admission, onward)    Start     Dose/Rate Route Frequency Ordered Stop   01/31/21 1000  azithromycin (ZITHROMAX) tablet 250 mg  Status:  Discontinued       See Hyperspace for full Linked Orders Report.   250 mg Oral Daily 01/30/21 1532 01/31/21 1218   01/30/21 1630  azithromycin (ZITHROMAX) tablet 500 mg       See Hyperspace for full Linked Orders Report.   500 mg Oral Daily 01/30/21 1532 01/30/21 1617   01/30/21 1630  piperacillin-tazobactam (ZOSYN) IVPB 3.375 g  Status:  Discontinued        3.375 g 12.5 mL/hr over 240 Minutes Intravenous Every 8 hours 01/30/21 1540 01/31/21 1218   01/23/21 2200  sulfamethoxazole-trimethoprim (BACTRIM) 400-80 MG per tablet 1 tablet        1 tablet  Oral Every 12 hours 01/23/21 1730     01/19/21 2000  sulfamethoxazole-trimethoprim (BACTRIM) 400-80 MG per tablet 1 tablet  Status:  Discontinued        1 tablet Oral Daily 01/19/21 1759 01/23/21 1730   01/15/21 2200  acyclovir (ZOVIRAX) tablet 400 mg  Status:  Discontinued        400 mg Oral 2 times daily 01/15/21 1558 01/15/21 1559   01/15/21 1645  acyclovir (ZOVIRAX) 200 MG capsule 400 mg  Status:  Discontinued        400 mg Oral 2 times daily 01/15/21 1558 02/02/21 1344   01/15/21 1515  acyclovir (ZOVIRAX) tablet 400 mg  Status:  Discontinued        400 mg Oral 2 times daily 01/15/21 1424 01/15/21 1556   01/14/21 1900  azithromycin (ZITHROMAX) 500 mg in sodium chloride 0.9 % 250 mL IVPB        500 mg 250 mL/hr over 60 Minutes Intravenous Every 24 hours 01/13/21 1835 01/20/21 2359   01/14/21 1900  cefTRIAXone (ROCEPHIN) 2 g in sodium chloride 0.9 % 100 mL IVPB  Status:  Discontinued        2 g 200 mL/hr over 30 Minutes Intravenous Every 24 hours 01/13/21 1835 01/13/21 2251   01/13/21 2345  ceFEPIme (MAXIPIME) 2 g in sodium chloride 0.9 % 100 mL IVPB  Status:  Discontinued        2 g 200 mL/hr over 30 Minutes Intravenous Every 8 hours 01/13/21 2309 01/19/21 2035   01/13/21 1800  azithromycin (ZITHROMAX) 500 mg in sodium chloride 0.9 % 250 mL IVPB        500 mg 250 mL/hr over 60 Minutes Intravenous  Once 01/13/21 1746 01/13/21 1958   01/13/21 1800  cefTRIAXone (ROCEPHIN) 1 g in sodium chloride 0.9 % 100 mL IVPB        1 g 200 mL/hr over 30 Minutes Intravenous  Once 01/13/21 1746 01/13/21 2054              Family Communication/Anticipated D/C date and plan/Code Status   DVT prophylaxis: SCDs Start: 01/13/21 1831 apixaban (ELIQUIS) tablet 5 mg     Code Status: Full Code  Family Communication: None Disposition Plan:    Status is: Inpatient  Remains inpatient appropriate because:Inpatient level of care appropriate due to severity of illness  Dispo: The patient is from:  Home              Anticipated d/c is to: Home              Patient currently is not medically stable to d/c.   Difficult to place patient No           Subjective:   Interval events noted.  He complains of shortness of breath with minimal  exertion.  Objective:    Vitals:   02/14/21 0803 02/14/21 0826 02/14/21 0839 02/14/21 1144  BP: 124/73   (!) 143/84  Pulse: 82   92  Resp: 17   17  Temp: 98.7 F (37.1 C)   98 F (36.7 C)  TempSrc:      SpO2: 95% 90% 92% 94%  Weight:      Height:       No data found.   Intake/Output Summary (Last 24 hours) at 02/14/2021 1306 Last data filed at 02/14/2021 1229 Gross per 24 hour  Intake 1668 ml  Output 1925 ml  Net -257 ml   Filed Weights   02/11/21 0412 02/12/21 0355 02/14/21 0500  Weight: 71.6 kg 71.4 kg 68.1 kg    Exam:  GEN: NAD SKIN: No rash EYES: EOMI ENT: MMM CV: RRR PULM: Bibasilar rales. ABD: soft, ND, NT, +BS CNS: AAO x 3, non focal EXT: No edema or tenderness      Data Reviewed:   I have personally reviewed following labs and imaging studies:  Labs: Labs show the following:   Basic Metabolic Panel: Recent Labs  Lab 02/09/21 0700 02/10/21 0534  NA 135 133*  K 4.5 4.5  CL 94* 93*  CO2 32 32  GLUCOSE 277* 384*  BUN 20 21  CREATININE 0.76 0.63  CALCIUM 9.1 8.7*  MG  --  2.0   GFR Estimated Creatinine Clearance: 92.2 mL/min (by C-G formula based on SCr of 0.63 mg/dL). Liver Function Tests: No results for input(s): AST, ALT, ALKPHOS, BILITOT, PROT, ALBUMIN in the last 168 hours. No results for input(s): LIPASE, AMYLASE in the last 168 hours. No results for input(s): AMMONIA in the last 168 hours. Coagulation profile No results for input(s): INR, PROTIME in the last 168 hours.  CBC: Recent Labs  Lab 02/10/21 0534 02/13/21 0623  WBC 7.1 5.8  HGB 10.1* 11.5*  HCT 29.7* 33.3*  MCV 86.6 86.3  PLT 186 238   Cardiac Enzymes: No results for input(s): CKTOTAL, CKMB, CKMBINDEX,  TROPONINI in the last 168 hours. BNP (last 3 results) No results for input(s): PROBNP in the last 8760 hours. CBG: Recent Labs  Lab 02/13/21 1201 02/13/21 1627 02/13/21 2112 02/14/21 0805 02/14/21 1143  GLUCAP 247* 201* 273* 302* 205*   D-Dimer: No results for input(s): DDIMER in the last 72 hours. Hgb A1c: No results for input(s): HGBA1C in the last 72 hours. Lipid Profile: No results for input(s): CHOL, HDL, LDLCALC, TRIG, CHOLHDL, LDLDIRECT in the last 72 hours. Thyroid function studies: No results for input(s): TSH, T4TOTAL, T3FREE, THYROIDAB in the last 72 hours.  Invalid input(s): FREET3 Anemia work up: No results for input(s): VITAMINB12, FOLATE, FERRITIN, TIBC, IRON, RETICCTPCT in the last 72 hours. Sepsis Labs: Recent Labs  Lab 02/10/21 0534 02/13/21 0623  WBC 7.1 5.8    Microbiology Recent Results (from the past 240 hour(s))  Culture, BAL-quantitative w Gram Stain     Status: None   Collection Time: 02/08/21  7:50 AM   Specimen: SPU; Respiratory  Result Value Ref Range Status   Specimen Description   Final    SPUTUM Performed at [facility], [address], [place], [place] [address]    Special Requests   Final    NONE Performed at [facility], [address] [place], [place] [address]    Gram Stain   Final    NO SQUAMOUS EPITHELIAL CELLS SEEN NO WBC SEEN FEW GRAM POSITIVE RODS FEW Lonell Grandchild  POSITIVE COCCI    Culture   Final    FEW Normal respiratory flora-no Staph aureus or Pseudomonas seen Performed at [facility], [place] [address] [place], [place] [address]    Report Status 02/10/2021 FINAL  Final    Procedures and diagnostic studies:  DG Chest Port 1 View  Result Date: 02/13/2021 CLINICAL DATA:  Atelectasis EXAM: PORTABLE CHEST 1 VIEW COMPARISON:  02/05/2021 FINDINGS: Low lung volumes with similar appearance of interstitial lung disease, left greater than right. No pleural effusion. Right chest wall port has been  removed. Stable cardiomediastinal contours. IMPRESSION: Interval removal of chest port. Interstitial lung disease with similar lung aeration to prior study. Electronically Signed   By: Macy Mis M.D.   On: 02/13/2021 13:21               LOS: 32 days   [facility]   Pager on [email or web address]. If 7PM-7AM, please contact night-coverage at [email or web address]     02/14/2021, 1:06 PM

## 2021-02-14 NOTE — Progress Notes (Signed)
Pulmonary Medicine          Date: 02/14/2021,   MRN# [id number] Edward Atkinson 10-Nov-1960     AdmissionWeight: 63.5 kg                 CurrentWeight: 68.1 kg   Referring physician: Dr Roderic Palau   CHIEF COMPLAINT:   Acute on chronic hypoxemic respiratory failure   HISTORY OF PRESENT ILLNESS   60 yo M w/hx of chronic hypoxemia and ILD post COVID19, T2DM, PE on eliquis, Lymphoma s/p Rituxan which is completed.  Came in due to worsening SOB and DOE specifically mild exertional defacation/urination with feelings of presyncope and severe dyspnea which started to get worse post tapering from steroid appx few days prior to onset of symptom progression.  He was placed on solumedrol and reported mild improvement.  He is on 10L /min [place] during my evaluation.  He had CT chest done with PE protocol and noted to have bilateral GGO infiltrates worse compared to June study. PCCM consultation for further evaluation and management.     01/17/21-  patient seems to be slighly clinically worse post reduction of IV steroids yesterday indicative of ongoing ILD exacerbation, thus far infectious workup is negative but inflammatory biomarkers are highly elevated also pointing towards ILD exacerbation.  There is new CXR in process this morning for interval changes. We discussed increasing steroids slightly and giving more time.  I will also initiate steroid sparing immunomodulation with BID cellcept as patient will likely need therapy for prolonged time period.  Compensatory tachycardia noted appreciate cardiology team.  Sugars with peaks and valleys with adjusted steroid therapy.  Patient with muscle wasting and protein calorie malnutrition but eating better this am.  01/18/21- patient is on HFNC at 60%, feels better, has not been able to tolerate BIPAP.  Wife is at bedside.  BP is borderline low MAP 76.  He has negative infectious workup and thus far ILD exacerbation seems to be main differential. He  continues to have peaks and valleys in blood glucose.  We started steroid sparing regimen with cellcept bid 250 mg and will increase dose as we taper off prednisone.  Remains tachyarrtythmic but improved. Appreciate everyone involved.     01/19/21- patient seen and examined at bedside.  Wife present during evaluation.  Ow HFNC weaned to 41%/45L/MIN.  CRP is still up, have increased cellcept and reducing solumedrol.  Patient felt stronger physically and was able to get OOB to chair. Cough is less now.  He is non-labored with breathing no accessory mm use today. He had dark stools with reduced hb, FOBT ordred.  Haptoglobin is elevated. Pathology slide review ordered.   01/20/21- patient is essentially unchanged from yesterday from pulmonary perspective. CXR also unchanged,  bloodwork with reduction in CRP.  Plan to wean O2 and continue OOB with PT/OT,  Continue current steroid dose and Cellcept.   01/21/21- patient is stable on HFNC.  He had small set back with increased O2 req after exerting himself in bathroom.  HFNC from 41/45 to 54/45.   He diuresed very well with lasix 20 bid >2L urine in past 24h.  Tachycardia is improved. He does not feel weaker and infact states he feels slowly improved.   01/22/21- I spoke to patient regarding goals of care and recommend DNR.  He is agreeable but I asked him to discuss with wife and family out of respect and we will revisit tommorow.  He states he is feeling better  and CRP is trending down but he remains on HFNC 60/40.  He was able to get OOB today and walked to bathroom but experienced mild desaturation with quick recovery "not much issues".      01/23/21- patient is still on high flow nasal canula , he is on 56%/45L.  We reviewed code status and he is still in discussion with family regarding DNR.   We discussed lung transplant per family request and this may be a future option if he is strong enough and recovers.  I have increased insluin TID to 20 from 15 and  increased bactrim to bid SS   01/24/21- patient weaned on HFNC to 49/45, he was able to use BIPAP overnight thinks its helping.  CXR this AM with interval improvement. Will keep on current dose of meds.  Repeat CRP in am.  Last BM yesterday well formed no blood, eating good, urine output very good on 20 bid lasix. Getting OOB with  transient desaturation.  Overall slight improvement.    01/25/21- patient has weaned down to 35%/40L/min.  He remains full code. Reviewed medical plan with Dr Jimmye Norman today.  Reduced steroids from 50 BID to 40 BID and also reduced insulin slightly.    01/26/21- patient is further weaned to 15L humidified HF nasal canula.  He did PT ROM exercises with mild desaturation. CXR today with expiratory image unable to see much interval changes. Patient reports improvement clinically. Plan to continue current regimen. Sugars slightly high due to steroids, will wean steroids soon. Continue cellcept as current 500 bid  01/27/21- patient is relatively same as yesterday, his O2 is weaned down to 14L.  He remains on IV solumedrol at 50 bid, we did discuss weaning this further to 40 BID. Will keep cellcept at current dose 500 bid.  His ankles have been down in seated position and are edematous on examination. He continues to participate with PT and feels good subjectively. We discussed starting dose of lasix for pedal edema.    01/28/21- patient examined at bedside, s/p PT/OT session with slight dyspnea.  He was weaned to 10L O2 but then went back up to 13 post PT due to increased WOB.  He has complete resolution of LE edema and appears to be euvolemic at this point. We discussed reducing soluemdrol today to 20 bid.  I reviewed plan with Dr Leslye Peer today.  01/29/21- patient examined at bedside.  He had a good day and feels better. We reviewed medical plan and are reducing steroids to PO prednisone starting at 1m daily.   01/30/21-  Patient seen and examined at bedside.  CXR this am with  worsening pulmonary edema, included below.  Vitals stable with mild tachypnea and tachycardia with activity. Will deliver 1 dose IV lasix 20 today. Plan to continue Pred PO and cellcept at 500 bid. Pulmonary intensive PT with IS and will initiate Metaneb therapy with albuterol. Blood work today will be repeated CRP CMP phos procal ESR.  Patient is agreeable to BIPAP QHS and Metaneb BID.   01/31/21-  patient had set back yesterday with mild flash pulm edema on CXR. He diuresed very well overnight and feels back to where he was at previously.  He had transient reactive leukocytosis as well.  We initiated empirically zosyn and zithromax on top of bid bactrim.  This probably could be dcd since he recovered so quickly which would not occur with pneumonia.  Ill go head and dc this today but continue PO bactrim.  Will stay on pred 50 today with cellcept at 500 bid as previous.  His O2 has been reduced from 95 to 70% overnight and hes working with PT today. Hopefully we can wean this back down to 10L as he was previosly then back to his home setting.   02/01/21- patient is somewhat better today, he is being weaned on O2 and is now down to 40%. He had BM today. Leaving prednisone at current dose and cxr today.   02/02/21- patient is now on 15L HF bubbler.  He reports feeling better.  He had 3 bowel movements after stool softner. He is working with PT/OT.  His long term prognosis is poor unfortunately and we appreciate collaboration with Palliative care team.   02/03/21- patient had over exerted himself again and required increased O2. He states he feels better and just did more then he should have with PT.  His wbc count is trending down. His CRP is trending down.  I have started morhpine for air hunger and pain at sacrum for skin breakdown and pressure ulceration.     02/04/21- patient is unchanged.  He wants to keep fighting to improve.  CRP is trending down again.  ESR is upward trending. He was unable to perform  adequately with PT and had severe dyspnea and desaturation just to get OOB to chair. Discussed case with [employer] to see if he can have HFNC at home but the device has been recalled. Discussed patient with Dr Roosevelt Locks plan is for additional diuresis.  I have increased cellcept to 750 BID today  02/05/21- patient with UOP >2L yesterday, his spO2 req remains HFNC 35/40.  He feels well and wants to keep working to be discharged to rehab. We have ordered NIV for him.  Patient has pulmonary fibrosis with restrictive lung disease and would benefit from device such as Trilogy 100/200 with O2 bleed in.  His overall prognosis is poor.   02/06/21- patient is back up to 50%/45L.  He clinically feels well but just not able to get off the oxygen or improve as well as we had hoped.  I had lengthy discussion with patient he wishes to continue current inpatient therapy and wants to be more aggressive medically.  I have increased steroids to solumedrol 40 bid again.    02/07/21- patient is unchanged, he states he feels well.  Sitting up in chair speaking infull sentences but still rquiring HFNC at 50%/45L.  Today will perform HRCT to evaluate details of pulmonary parenchyma.   02/08/21- patient s/p HRCT with bilateral pulmonary fibrosis and bronchiectasis.  We discussed [employer] VEST therapy.  Reviewed with oncology will plan to remove chest port to allow patient to use vest therapy.  He is working with PT to wean O2.   02/09/21- patient weaned to 9L/min.  For port removal today.   02/10/21- patient weaned to 6L min.  Reviewed care plan with patient and attending physician   02/11/21- patient stable overnight.  On 6L/min [place].  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    02/12/21- patient was approved for NIV at home.  He is down to 3L/min [place].   He is in process of transitioning to PO dexamethasone.   He is working with PT.   02/13/21- patient with tapering protocol today, were tapering  steroids slower due to exacerbation re-flare post decreased dosing and PO transition previosly.  Plan for Solumedrol 10 bid IV plus dexamethasone 10 bid PO and repeat CXR today.  02/14/21- patient did well overnight with transition to PO regiment using Dexamethasone. Will dc IV solumedrol completely today.     PAST MEDICAL HISTORY   Past Medical History:  Diagnosis Date  . Chronic respiratory failure with hypoxia (HCC)    2L [place] continuous  . COVID-19   . Diabetes mellitus without complication ([place])   . Diverticulitis   . Follicular lymphoma of intra-abdominal lymph nodes ([place]) 02/15/2019  . ILD (interstitial lung disease) ([place])      SURGICAL HISTORY   Past Surgical History:  Procedure Laterality Date  . CHOLECYSTECTOMY    . COLON RESECTION     DUE TO DIVERTICULITIS  . FLEXIBLE BRONCHOSCOPY N/A 12/02/2019   Procedure: FLEXIBLE BRONCHOSCOPY;  Surgeon: Ottie Glazier, MD;  Location: [facility] OR[facility];  Service: Thoracic;  Laterality: N/A;  . PORTA CATH INSERTION N/A 03/11/2019   Procedure: PORTA CATH INSERTION;  Surgeon: Algernon Huxley, MD;  Location: [place] [facility];  Service: Cardiovascular;  Laterality: N/A;  . PORTA CATH REMOVAL Right 02/09/2021   Procedure: PORTA CATH REMOVAL;  Surgeon: Katha Cabal, MD;  Location: [place] [facility];  Service: Cardiovascular;  Laterality: Right;  . PULMONARY THROMBECTOMY N/A 01/29/2019   Procedure: PULMONARY THROMBECTOMY;  Surgeon: Algernon Huxley, MD;  Location: [place] [facility];  Service: Cardiovascular;  Laterality: N/A;     FAMILY HISTORY   Family History  Problem Relation Age of Onset  . Diabetes Brother      SOCIAL HISTORY   Social History   Tobacco Use  . Smoking status: Never  . Smokeless tobacco: Never  Vaping Use  . Vaping Use: Never used  Substance Use Topics  . Alcohol use: Yes    Alcohol/week: 0.0 - 2.0 standard drinks    Comment: occasional  . Drug use: Never     MEDICATIONS    Home Medication:     Current Medication:  Current Facility-Administered Medications:  .  acetaminophen (TYLENOL) tablet 650 mg, 650 mg, Oral, Q6H PRN **OR** acetaminophen (TYLENOL) suppository 650 mg, 650 mg, Rectal, Q6H PRN, Oswald Hillock, RPH .  ALPRAZolam Duanne Moron) tablet 0.25 mg, 0.25 mg, Oral, QHS PRN, Sharen Hones, MD, 0.25 mg at 02/13/21 2148 .  ALPRAZolam Duanne Moron) tablet 0.25 mg, 0.25 mg, Oral, q morning, Sharen Hones, MD, 0.25 mg at 02/14/21 0834 .  apixaban (ELIQUIS) tablet 5 mg, 5 mg, Oral, BID, Beers, Shanon Brow, RPH, 5 mg at 02/14/21 0835 .  Chlorhexidine Gluconate Cloth 2 % PADS 6 each, 6 each, Topical, Daily, Kathie Dike, MD, 6 each at 02/14/21 0956 .  dexamethasone (DECADRON) tablet 10 mg, 10 mg, Oral, Q12H, Ottie Glazier, MD, 10 mg at 02/14/21 0837 .  digoxin (LANOXIN) tablet 0.125 mg, 0.125 mg, Oral, Daily, Memon, Jehanzeb, MD, 0.125 mg at 02/14/21 0830 .  diltiazem (CARDIZEM CD) 24 hr capsule 120 mg, 120 mg, Oral, Daily, Rhylan Kagel, MD, 120 mg at 02/14/21 0837 .  diltiazem (CARDIZEM) injection 10 mg, 10 mg, Intravenous, Q6H PRN, Wyvonnia Dusky, MD, 10 mg at 01/24/21 2328 .  docusate sodium (COLACE) capsule 200 mg, 200 mg, Oral, BID, Wyvonnia Dusky, MD, 200 mg at 02/14/21 0835 .  feeding supplement (NEPRO CARB STEADY) liquid 237 mL, 237 mL, Oral, TID BM, Memon, Jehanzeb, MD, Last Rate: 0 mL/hr at 01/21/21 2130, 237 mL at 02/14/21 1226 .  guaiFENesin (MUCINEX) 12 hr tablet 1,200 mg, 1,200 mg, Oral, BID, Kathie Dike, MD, 1,200 mg at 02/14/21 0835 .  guaiFENesin-dextromethorphan (ROBITUSSIN DM) 100-10  MG/5ML syrup 5 mL, 5 mL, Oral, Q4H PRN, Kathie Dike, MD, 5 mL at 02/04/21 0850 .  HYDROmorphone (DILAUDID) injection 1 mg, 1 mg, Intravenous, Once PRN, Stegmayer, Kimberly A, PA-C .  insulin aspart (novoLOG) injection 0-15 Units, 0-15 Units, Subcutaneous, TID WC, Sharen Hones, MD, 5 Units at 02/14/21 1225 .  insulin aspart (novoLOG) injection 0-5 Units, 0-5 Units, Subcutaneous,  QHS, Sharen Hones, MD, 3 Units at 02/13/21 2158 .  insulin aspart (novoLOG) injection 12 Units, 12 Units, Subcutaneous, TID WC, Jennye Boroughs, MD, 12 Units at 02/14/21 1226 .  insulin glargine-yfgn (SEMGLEE) injection 16 Units, 16 Units, Subcutaneous, BID, Jennye Boroughs, MD, 16 Units at 02/14/21 0836 .  ipratropium-albuterol (DUONEB) 0.5-2.5 (3) MG/3ML nebulizer solution 3 mL, 3 mL, Nebulization, TID, Jennye Boroughs, MD, 3 mL at 02/14/21 0826 .  lactulose (CHRONULAC) 10 GM/15ML solution 30 g, 30 g, Oral, BID PRN, Leslye Peer, Richard, MD .  levalbuterol [facility]) nebulizer solution 1.25 mg, 1.25 mg, Nebulization, Q4H PRN, Howerter, Justin B, DO, 1.25 mg at 01/17/21 2011 .  methylPREDNISolone sodium succinate (SOLU-MEDROL) 40 mg/mL injection 10 mg, 10 mg, Intravenous, Q12H, Lanney Gins, Deveney Bayon, MD, 10 mg at 02/14/21 0830 .  morphine 2 MG/ML injection 1 mg, 1 mg, Intravenous, Q3H PRN, Ottie Glazier, MD, 1 mg at 02/11/21 1827 .  multivitamin with minerals tablet 1 tablet, 1 tablet, Oral, Daily, Kathie Dike, MD, 1 tablet at 02/14/21 0852 .  mycophenolate (CELLCEPT) capsule 750 mg, 750 mg, Oral, BID, Lanney Gins, Feras Gardella, MD, 750 mg at 02/14/21 0836 .  ondansetron (ZOFRAN) injection 4 mg, 4 mg, Intravenous, Q6H PRN, Stegmayer, Kimberly A, PA-C .  polyethylene glycol (MIRALAX / GLYCOLAX) packet 17 g, 17 g, Oral, Daily, Leslye Peer, Richard, MD, 17 g at 02/14/21 0836 .  simethicone (MYLICON) chewable tablet 80 mg, 80 mg, Oral, QID PRN, Loletha Grayer, MD, 80 mg at 01/29/21 1411 .  sodium chloride (OCEAN) 0.65 % nasal spray 1 spray, 1 spray, Each Nare, PRN, Loletha Grayer, MD, 1 spray at 01/30/21 2142 .  sodium chloride flush (NS) 0.9 % injection 10-40 mL, 10-40 mL, Intracatheter, Q12H, Kathie Dike, MD, 10 mL at 02/14/21 0838 .  sodium chloride flush (NS) 0.9 % injection 10-40 mL, 10-40 mL, Intracatheter, PRN, Kathie Dike, MD, 10 mL at 01/24/21 0915 .  sulfamethoxazole-trimethoprim (BACTRIM) 400-80 MG per  tablet 1 tablet, 1 tablet, Oral, Q12H, Ottie Glazier, MD, 1 tablet at 02/14/21 0835 .  traZODone (DESYREL) tablet 50 mg, 50 mg, Oral, QHS PRN, Sharion Settler, NP, 50 mg at 02/10/21 2202    ALLERGIES   Penicillins     REVIEW OF SYSTEMS    Review of Systems:  Gen:  Denies  fever, sweats, chills weigh loss  HEENT: Denies blurred vision, double vision, ear pain, eye pain, hearing loss, nose bleeds, sore throat Cardiac:  No dizziness, chest pain or heaviness, chest tightness,edema Resp:  admits to severe dyspnea Gi: Denies swallowing difficulty, stomach pain, nausea or vomiting, diarrhea, constipation, bowel incontinence Gu:  Denies bladder incontinence, burning urine Ext:   Denies Joint pain, stiffness or swelling Skin: Denies  skin rash, easy bruising or bleeding or hives Endoc:  Denies polyuria, polydipsia , polyphagia or weight change Psych:   admits to axniety  Other:  All other systems negative   VS: BP (!) 143/84 (BP Location: Left Arm)   Pulse 92   Temp 98 F (36.7 C)   Resp 17   Ht 6' (1.829 m)   Wt 68.1 kg   SpO2 94%  BMI 20.36 kg/m      PHYSICAL EXAM    GENERAL:NAD, no fevers, chills, no weakness no fatigue HEAD: Normocephalic, atraumatic.  EYES: Pupils equal, round, reactive to light. Extraocular muscles intact. No scleral icterus.  MOUTH: Moist mucosal membrane. Dentition intact. No abscess noted.  EAR, NOSE, THROAT: Clear without exudates. No external lesions.  NECK: Supple. No thyromegaly. No nodules. No JVD.  PULMONARY: Bilateral rhonchi improved from previous CARDIOVASCULAR: S1 and S2. Regular rate and rhythm. No murmurs, rubs, or gallops. No edema. Pedal pulses 2+ bilaterally.  GASTROINTESTINAL: Soft, nontender, nondistended. No masses. Positive bowel sounds. No hepatosplenomegaly.  MUSCULOSKELETAL: No swelling, clubbing, or edema. Range of motion full in all extremities.  NEUROLOGIC: Cranial nerves II through XII are intact. No gross focal  neurological deficits. Sensation intact. Reflexes intact.  SKIN: No ulceration, lesions, rashes, or cyanosis. Skin warm and dry. Turgor intact.  PSYCHIATRIC: Mood, affect within normal limits. The patient is awake, alert and oriented x 3. Insight, judgment intact.       IMAGING    CT Chest High Resolution  Result Date: 02/07/2021 CLINICAL DATA:  Inpatient. Interstitial lung disease. Worsening dyspnea. Chronic hypoxemia post COVID-19. History of lymphoma treated with Rituxan. EXAM: CT CHEST WITHOUT CONTRAST TECHNIQUE: Multidetector CT imaging of the chest was performed following the standard protocol without intravenous contrast. High resolution imaging of the lungs, as well as inspiratory and expiratory imaging, was performed. COMPARISON:  02/05/2021 chest radiograph. 01/13/2021 chest CT angiogram. FINDINGS: Cardiovascular: Normal heart size. No significant pericardial effusion/thickening. Right internal jugular central venous catheter terminates at the cavoatrial junction. Normal course and caliber of the thoracic aorta. Stable dilated main pulmonary artery (3.7 cm diameter). Mediastinum/Nodes: No discrete thyroid nodules. Unremarkable esophagus. No pathologically enlarged axillary, mediastinal or hilar lymph nodes, noting limited sensitivity for the detection of hilar adenopathy on this noncontrast study. Lungs/Pleura: No pneumothorax. No pleural effusion. Severe patchy confluent peribronchovascular and peripheral ground-glass opacity, reticulation, traction bronchiectasis, architectural distortion and volume loss in both lungs with a basilar predominance. The ground-glass opacities have mildly improved since 01/13/2021 chest CT. The bronchiectasis, distortion and volume loss appear slightly worsened. No significant lobular air trapping or evidence of tracheobronchomalacia on the expiration sequence. No acute consolidative airspace disease, lung masses or significant pulmonary nodules. No frank  honeycombing. Upper abdomen: Chronic mild elevation of the right hemidiaphragm. Musculoskeletal: No aggressive appearing focal osseous lesions. Mild thoracic spondylosis. IMPRESSION: 1. Spectrum of findings compatible with severe basilar predominant fibrotic interstitial lung disease with ground-glass and no frank honeycombing predominance. Ground-glass opacities have slightly improved and bronchiectasis and other findings of fibrosis have slightly worsened since recent 01/13/2021 chest CT angiogram study. Findings are most compatible with a severe rapidly progressive postinflammatory fibrosis or fibrotic NSIP pattern. Findings are suggestive of an alternative diagnosis (not UIP) per consensus guidelines: Diagnosis of Idiopathic Pulmonary Fibrosis: An Official ATS/ERS/JRS/ALAT Clinical Practice Guideline. [place], Iss 5, [phone], Dec 31 2016. 2. Stable dilated main pulmonary artery, suggesting chronic pulmonary arterial hypertension. 3. Chronic mild elevation of the right hemidiaphragm. Electronically Signed   By: Ilona Sorrel M.D.   On: 02/07/2021 17:29   PERIPHERAL VASCULAR CATHETERIZATION  Result Date: 02/09/2021 See surgical note for result.  DG Chest Port 1 View  Result Date: 02/13/2021 CLINICAL DATA:  Atelectasis EXAM: PORTABLE CHEST 1 VIEW COMPARISON:  02/05/2021 FINDINGS: Low lung volumes with similar appearance of interstitial lung disease, left greater than right. No pleural effusion. Right chest wall port has  been removed. Stable cardiomediastinal contours. IMPRESSION: Interval removal of chest port. Interstitial lung disease with similar lung aeration to prior study. Electronically Signed   By: Macy Mis M.D.   On: 02/13/2021 13:21   DG Chest Port 1 View  Result Date: 02/05/2021 CLINICAL DATA:  Bilateral pulmonary infiltrates on chest x-ray. EXAM: PORTABLE CHEST 1 VIEW COMPARISON:  Chest radiograph 02/01/2021 FINDINGS: Power injectable right IJ port central  venous catheter tip projects at the level of the superior cavoatrial junction. Unchanged low lung volumes with lower lobe predominant interstitial opacities, left greater than right. No new focal consolidation, pleural effusion, or pneumothorax. Heart is normal in size. Surgical clips in the right upper abdomen. IMPRESSION: Bilateral interstitial airspace opacities are unchanged compared to 02/01/2021. No new focal consolidation, pleural effusion, or pneumothorax. Electronically Signed   By: Ileana Roup M.D.   On: 02/05/2021 10:59   DG Chest Port 1 View  Result Date: 02/01/2021 CLINICAL DATA:  Shortness of breath, edema EXAM: PORTABLE CHEST 1 VIEW COMPARISON:  01/30/2021 FINDINGS: Stable positioning of right IJ approach Port-A-Cath. Heart size within normal limits. Low lung volumes. Slight interval worsening of bilateral airspace opacities, left worse than right. No pleural effusion or pneumothorax. IMPRESSION: Slight interval worsening of bilateral airspace opacities, left worse than right. Electronically Signed   By: Davina Poke D.O.   On: 02/01/2021 14:37   DG Chest Port 1 View  Result Date: 01/30/2021 CLINICAL DATA:  Acute on chronic hypoxemic respiratory failure. EXAM: PORTABLE CHEST 1 VIEW COMPARISON:  January 28, 2021 FINDINGS: No pneumothorax. Stable cardiomediastinal silhouette and right Port-A-Cath. Left-greater-than-right pulmonary opacities/infiltrates. No other interval changes. IMPRESSION: 1. Persistent low lung volumes and bilateral pulmonary infiltrates. No interval changes. Electronically Signed   By: Dorise Bullion III M.D.   On: 01/30/2021 08:36   DG Chest Port 1 View  Result Date: 01/28/2021 CLINICAL DATA:  Shortness of breath. EXAM: PORTABLE CHEST 1 VIEW COMPARISON:  Multiple recent chest films. FINDINGS: The right IJ power port is stable. Persistent low lung volumes and bilateral pulmonary infiltrates. No definite pleural effusion or pneumothorax. IMPRESSION: Persistent low  lung volumes and bilateral pulmonary infiltrates. Electronically Signed   By: Marijo Sanes M.D.   On: 01/28/2021 16:03   DG Chest Port 1 View  Result Date: 01/26/2021 CLINICAL DATA:  Shortness of breath, abnormal chest x-ray EXAM: PORTABLE CHEST 1 VIEW COMPARISON:  01/24/2021 FINDINGS: Right Port-A-Cath remains in place, unchanged. Low lung volumes with bilateral airspace opacities, most pronounced in the left perihilar and lower lobe, unchanged since prior study. No visible effusions or pneumothorax. Heart is normal size. IMPRESSION: Low lung volumes with bilateral airspace disease, left-greater-than-right. No significant change. Electronically Signed   By: Rolm Baptise M.D.   On: 01/26/2021 09:42   DG Chest Port 1 View  Result Date: 01/24/2021 CLINICAL DATA:  Follow-up multifocal pneumonia. EXAM: PORTABLE CHEST 1 VIEW COMPARISON:  01/22/2021 and older exams. FINDINGS: Mild interval decrease in the airspace lung opacities, most evident on the left. Lung volumes remain low. No new lung abnormalities. No visualized pleural effusion.  No pneumothorax. Right internal jugular Port-A-Cath is stable. IMPRESSION: 1. Mild interval improvement in lung aeration with decreased airspace lung opacities, most notably on the left. Electronically Signed   By: Lajean Manes M.D.   On: 01/24/2021 07:57   DG Chest Port 1 View  Result Date: 01/22/2021 CLINICAL DATA:  Hypoxia, shortness of breath. EXAM: PORTABLE CHEST 1 VIEW COMPARISON:  January 20, 2021. FINDINGS: Stable cardiomediastinal silhouette.  Right internal jugular Port-A-Cath is unchanged in position. Hypoinflation of the lungs is noted. Stable bilateral lung opacities are noted, left greater than right, most consistent with multifocal pneumonia. Bony thorax is unremarkable. IMPRESSION: Hypoinflation of the lungs. Stable bilateral lung opacities are noted, left greater than right, most consistent with multifocal pneumonia. Electronically Signed   By: Marijo Conception M.D.   On: 01/22/2021 19:26   DG Chest Port 1 View  Result Date: 01/20/2021 CLINICAL DATA:  Pneumonitis EXAM: PORTABLE CHEST 1 VIEW COMPARISON:  01/19/2021 FINDINGS: Shallow inspiration. Patchy infiltrates in the left lung and right lung base, similar to prior study and likely multifocal pneumonia. No pleural effusions. No pneumothorax. Mediastinal contours appear intact. Heart size is normal. Power port type central venous catheter on the right with tip over the cavoatrial junction region. Surgical clips in the right upper quadrant. IMPRESSION: Shallow inspiration. Patchy infiltrates in both lungs, likely multifocal pneumonia, without change. Electronically Signed   By: Lucienne Capers M.D.   On: 01/20/2021 03:56   DG Chest Port 1 View  Result Date: 01/19/2021 CLINICAL DATA:  Abnormality on previous chest x-ray in a 60 year old male. EXAM: PORTABLE CHEST 1 VIEW COMPARISON:  CT from January 13, 2021, chest x-ray January 18, 2021. FINDINGS: RIGHT-sided Port-A-Cath terminates at the caval to atrial junction. Trachea is midline. Cardiomediastinal contours are stable. Interstitial and airspace opacities throughout the chest worse in the LEFT mid to upper chest, RIGHT upper chest lung bases bilaterally with similar appearance. On limited assessment there is no acute skeletal process. EKG leads project over the chest. IMPRESSION: No interval change in the appearance of the chest since previous imaging with continued decreased lung volumes and patchy bilateral opacities. Electronically Signed   By: Zetta Bills M.D.   On: 01/19/2021 07:59   DG Chest Port 1 View  Result Date: 01/18/2021 CLINICAL DATA:  Respiratory distress EXAM: PORTABLE CHEST 1 VIEW COMPARISON:  01/17/2021 FINDINGS: Cardiac shadow is stable. Right-sided chest wall port is again seen. Lungs are hypoinflated. Patchy airspace opacities again identified in both lungs left greater than right but stable from the prior exam.  IMPRESSION: Patchy airspace opacities bilaterally stable from the prior exam. Electronically Signed   By: Inez Catalina M.D.   On: 01/18/2021 17:24   DG Chest Port 1 View  Result Date: 01/17/2021 CLINICAL DATA:  Worsening shortness of breath. EXAM: PORTABLE CHEST 1 VIEW COMPARISON:  01/14/2021 FINDINGS: 0826 hours. Low volume film. Patchy airspace disease again noted in both lungs, left greater than right. Cardiopericardial silhouette is at upper limits of normal for size. Right Port-A-Cath again noted. Telemetry leads overlie the chest. IMPRESSION: Low volume film with patchy bilateral airspace disease, left greater than right. No substantial change. Electronically Signed   By: Misty Stanley M.D.   On: 01/17/2021 08:53           ASSESSMENT/PLAN   Acute on hypoxemic respiratory failure Due to acute exacerbation of Chronic lung disease/ILD with Critical illness Myopathy associated respiratory failure - present on admission  - COVID19 negative   - supplemental O2 during my evaluation 10l/min>>8L>>HFNC 7L -Respiratory viral panel-negative  -serum fungitell-negative  -legionella ab-negative  -nasal MRSA PCR-negative -Procalcitonin trend reviewed -strep pneumoniae ur AG -Histoplasma Ur Ag-negative -sputum resp cultures -AFB sputum expectorated specimen -sputum cytology  -completed  Cefepime course for cap -Reduced Solumedrol to 40 BID - 01/15/21>>50 BID>>40 BID>>30 BID>>20 BID>>pred 50>>soluemdrol 40 iv bid please watch surgars -reviewed pertinent imaging with patient today - ESR and  CRP are elevated but improving -PT/OT for d/c planning  -please encourage patient to use incentive spirometer few times each hour while hospitalized.   -continue Cellcept 250 bid >>500 bid >>750BID -UOP is adequate -Discussed goal of care - recommend DNR patient is in agreement but still discussing with wife, palliative care service to follow  -lasix 20 daily PO     Thank you for allowing me to  participate in the care of this patient.   Patient/Family are satisfied with care plan and all questions have been answered.   This document was prepared using Dragon voice recognition software and may include unintentional dictation errors.     Ottie Glazier, M.D.  Division of [facility]

## 2021-02-15 LAB — SEDIMENTATION RATE: Sed Rate: 18 mm/hr (ref 0–20)

## 2021-02-15 LAB — GLUCOSE, CAPILLARY
Glucose-Capillary: 290 mg/dL — ABNORMAL HIGH (ref 70–99)
Glucose-Capillary: 296 mg/dL — ABNORMAL HIGH (ref 70–99)
Glucose-Capillary: 166 mg/dL — ABNORMAL HIGH (ref 70–99)
Glucose-Capillary: 46 mg/dL — ABNORMAL LOW (ref 70–99)
Glucose-Capillary: 141 mg/dL — ABNORMAL HIGH (ref 70–99)

## 2021-02-15 LAB — C-REACTIVE PROTEIN: CRP: 0.6 mg/dL (ref <1.0)

## 2021-02-15 MED ORDER — INSULIN GLARGINE-YFGN 100 UNIT/ML ~~LOC~~ SOLN
20.0000 [IU] | Freq: Two times a day (BID) | SUBCUTANEOUS | Status: DC
  Filled 2021-02-15 (×3): qty 0.2

## 2021-02-15 NOTE — Progress Notes (Signed)
Progress Note    [name]  GQB:[id number] DOB: [birth year]  DOA: 01/13/2021 PCP: Rusty Aus, MD      Brief Narrative:    Medical records reviewed and are as summarized below:  Edward Atkinson is a 60 y.o. male with history of follicular lymphoma, [id number] infection, interstitial lung disease and chronic respiratory failure, and type 2 diabetes mellitus.       Assessment/Plan:   Principal Problem:   Acute on chronic respiratory failure with hypoxia (HCC) Active Problems:   Type 2 diabetes mellitus with hypoglycemia without coma, without long-term current use of insulin (HCC)   Follicular lymphoma (HCC)   SOB (shortness of breath)   Interstitial lung disease (HCC)   Severe sepsis (HCC)   CAP (community acquired pneumonia)   Elevated troponin   Protein calorie malnutrition (HCC)   Protein-calorie malnutrition, severe   AF (paroxysmal atrial fibrillation) (HCC)   Thrombocytosis   Leukocytosis   Pressure injury of skin   Hyponatremia   Uncontrolled type 2 diabetes mellitus with hyperglycemia (HCC)   Nutrition Problem: Severe Malnutrition Etiology: chronic illness (lymphoma, ILD)  Signs/Symptoms: severe fat depletion, severe muscle depletion, percent weight loss Percent weight loss: 16 %   Body mass index is 20.57 kg/m.  Acute on chronic hypoxemic respiratory failure, exacerbation of interstitial lung disease, bronchiectasis: He is on 4 L/min oxygen via nasal collar.  He is on 3 L/min oxygen at baseline.  Continue steroids.  Follow-up with pulmonologist for further recommendations.  Type II DM with severe hyperglycemia: Increase insulin glargine from 18 units to 20 units twice daily.  Continue NovoLog 12 units 3 times daily.  Monitor glucose levels will adjust insulin accordingly.     Paroxysmal atrial fibrillation: Continue Cardizem and Eliquis  Follicular lymphoma: Completed chemotherapy.  S/p removal of Port-A-Cath on  02/09/2021.  Immunocompromised state: He is on Bactrim for PCP prophylaxis   Diet Order             Diet Carb Modified Fluid consistency: Thin; Room service appropriate? Yes  Diet effective now                      Consultants: Pulmonologist Palliative care  Procedures: S/p removal of Port-A-Cath on 02/09/2021    Medications:    ALPRAZolam  0.25 mg Oral q morning   apixaban  5 mg Oral BID   Chlorhexidine Gluconate Cloth  6 each Topical Daily   dexamethasone  10 mg Oral Q12H   digoxin  0.125 mg Oral Daily   diltiazem  120 mg Oral Daily   docusate sodium  200 mg Oral BID   feeding supplement (NEPRO CARB STEADY)  237 mL Oral TID BM   guaiFENesin  1,200 mg Oral BID   insulin aspart  0-15 Units Subcutaneous TID WC   insulin aspart  0-5 Units Subcutaneous QHS   insulin aspart  12 Units Subcutaneous TID WC   insulin glargine-yfgn  18 Units Subcutaneous BID   ipratropium-albuterol  3 mL Nebulization TID   multivitamin with minerals  1 tablet Oral Daily   mycophenolate  750 mg Oral BID   polyethylene glycol  17 g Oral Daily   sodium chloride flush  10-40 mL Intracatheter Q12H   sulfamethoxazole-trimethoprim  1 tablet Oral Q12H   Continuous Infusions:   Anti-infectives (From admission, onward)    Start     Dose/Rate Route Frequency Ordered Stop   01/31/21 1000  azithromycin (ZITHROMAX) tablet 250  mg  Status:  Discontinued       See Hyperspace for full Linked Orders Report.   250 mg Oral Daily 01/30/21 1532 01/31/21 1218   01/30/21 1630  azithromycin (ZITHROMAX) tablet 500 mg       See Hyperspace for full Linked Orders Report.   500 mg Oral Daily 01/30/21 1532 01/30/21 1617   01/30/21 1630  piperacillin-tazobactam (ZOSYN) IVPB 3.375 g  Status:  Discontinued        3.375 g 12.5 mL/hr over 240 Minutes Intravenous Every 8 hours 01/30/21 1540 01/31/21 1218   01/23/21 2200  sulfamethoxazole-trimethoprim (BACTRIM) 400-80 MG per tablet 1 tablet        1 tablet Oral  Every 12 hours 01/23/21 1730     01/19/21 2000  sulfamethoxazole-trimethoprim (BACTRIM) 400-80 MG per tablet 1 tablet  Status:  Discontinued        1 tablet Oral Daily 01/19/21 1759 01/23/21 1730   01/15/21 2200  acyclovir (ZOVIRAX) tablet 400 mg  Status:  Discontinued        400 mg Oral 2 times daily 01/15/21 1558 01/15/21 1559   01/15/21 1645  acyclovir (ZOVIRAX) 200 MG capsule 400 mg  Status:  Discontinued        400 mg Oral 2 times daily 01/15/21 1558 02/02/21 1344   01/15/21 1515  acyclovir (ZOVIRAX) tablet 400 mg  Status:  Discontinued        400 mg Oral 2 times daily 01/15/21 1424 01/15/21 1556   01/14/21 1900  azithromycin (ZITHROMAX) 500 mg in sodium chloride 0.9 % 250 mL IVPB        500 mg 250 mL/hr over 60 Minutes Intravenous Every 24 hours 01/13/21 1835 01/20/21 2359   01/14/21 1900  cefTRIAXone (ROCEPHIN) 2 g in sodium chloride 0.9 % 100 mL IVPB  Status:  Discontinued        2 g 200 mL/hr over 30 Minutes Intravenous Every 24 hours 01/13/21 1835 01/13/21 2251   01/13/21 2345  ceFEPIme (MAXIPIME) 2 g in sodium chloride 0.9 % 100 mL IVPB  Status:  Discontinued        2 g 200 mL/hr over 30 Minutes Intravenous Every 8 hours 01/13/21 2309 01/19/21 2035   01/13/21 1800  azithromycin (ZITHROMAX) 500 mg in sodium chloride 0.9 % 250 mL IVPB        500 mg 250 mL/hr over 60 Minutes Intravenous  Once 01/13/21 1746 01/13/21 1958   01/13/21 1800  cefTRIAXone (ROCEPHIN) 1 g in sodium chloride 0.9 % 100 mL IVPB        1 g 200 mL/hr over 30 Minutes Intravenous  Once 01/13/21 1746 01/13/21 2054              Family Communication/Anticipated D/C date and plan/Code Status   DVT prophylaxis: SCDs Start: 01/13/21 1831 apixaban (ELIQUIS) tablet 5 mg     Code Status: Full Code  Family Communication: None Disposition Plan:    Status is: Inpatient  Remains inpatient appropriate because:Inpatient level of care appropriate due to severity of illness  Dispo: The patient is from:  Home              Anticipated d/c is to: Home              Patient currently is not medically stable to d/c.   Difficult to place patient No           Subjective:   C/o shortness of breath with exertion.  No cough or  chest pain  Objective:    Vitals:   02/15/21 0740 02/15/21 0820 02/15/21 1136 02/15/21 1328  BP: 122/73  137/88   Pulse: 86 98 97   Resp: 17 (!) 22 20   Temp: 97.8 F (36.6 C)  98 F (36.7 C)   TempSrc:      SpO2: 96% (!) 89% 93% 93%  Weight:      Height:       No data found.   Intake/Output Summary (Last 24 hours) at 02/15/2021 1637 Last data filed at 02/15/2021 1340 Gross per 24 hour  Intake 720 ml  Output 800 ml  Net -80 ml   Filed Weights   02/12/21 0355 02/14/21 0500 02/15/21 0551  Weight: 71.4 kg 68.1 kg 68.8 kg    Exam:  GEN: NAD SKIN: Warm and dry.  Stage II sacral decubitus ulcer EYES: No pallor or icterus ENT: MMM CV: RRR PULM: CTA B ABD: soft, ND, NT, +BS CNS: AAO x 3, non focal EXT: No edema or tenderness      Data Reviewed:   I have personally reviewed following labs and imaging studies:  Labs: Labs show the following:   Basic Metabolic Panel: Recent Labs  Lab 02/09/21 0700 02/10/21 0534  NA 135 133*  K 4.5 4.5  CL 94* 93*  CO2 32 32  GLUCOSE 277* 384*  BUN 20 21  CREATININE 0.76 0.63  CALCIUM 9.1 8.7*  MG  --  2.0   GFR Estimated Creatinine Clearance: 93.2 mL/min (by C-G formula based on SCr of 0.63 mg/dL). Liver Function Tests: No results for input(s): AST, ALT, ALKPHOS, BILITOT, PROT, ALBUMIN in the last 168 hours. No results for input(s): LIPASE, AMYLASE in the last 168 hours. No results for input(s): AMMONIA in the last 168 hours. Coagulation profile No results for input(s): INR, PROTIME in the last 168 hours.  CBC: Recent Labs  Lab 02/10/21 0534 02/13/21 0623  WBC 7.1 5.8  HGB 10.1* 11.5*  HCT 29.7* 33.3*  MCV 86.6 86.3  PLT 186 238   Cardiac Enzymes: No results for input(s):  CKTOTAL, CKMB, CKMBINDEX, TROPONINI in the last 168 hours. BNP (last 3 results) No results for input(s): PROBNP in the last 8760 hours. CBG: Recent Labs  Lab 02/14/21 1143 02/14/21 1655 02/14/21 2108 02/15/21 0743 02/15/21 1138  GLUCAP 205* 232* 178* 290* 296*   D-Dimer: No results for input(s): DDIMER in the last 72 hours. Hgb A1c: No results for input(s): HGBA1C in the last 72 hours. Lipid Profile: No results for input(s): CHOL, HDL, LDLCALC, TRIG, CHOLHDL, LDLDIRECT in the last 72 hours. Thyroid function studies: No results for input(s): TSH, T4TOTAL, T3FREE, THYROIDAB in the last 72 hours.  Invalid input(s): FREET3 Anemia work up: No results for input(s): VITAMINB12, FOLATE, FERRITIN, TIBC, IRON, RETICCTPCT in the last 72 hours. Sepsis Labs: Recent Labs  Lab 02/10/21 0534 02/13/21 0623  WBC 7.1 5.8    Microbiology Recent Results (from the past 240 hour(s))  Culture, BAL-quantitative w Gram Stain     Status: None   Collection Time: 02/08/21  7:50 AM   Specimen: SPU; Respiratory  Result Value Ref Range Status   Specimen Description   Final    SPUTUM Performed at [facility], [address] [place], [place] [address]    Special Requests   Final    NONE Performed at [facility], [address], [place], [place] [address]    Gram Stain   Final    NO SQUAMOUS  EPITHELIAL CELLS SEEN NO WBC SEEN FEW GRAM POSITIVE RODS FEW GRAM POSITIVE COCCI    Culture   Final    FEW Normal respiratory flora-no Staph aureus or Pseudomonas seen Performed at [facility], [address] [place], [place] [address]    Report Status 02/10/2021 FINAL  Final    Procedures and diagnostic studies:  No results found.             LOS: 33 days   [place] Copywriter, advertising on [email or web address]. If 7PM-7AM, please contact night-coverage at [email or web address]     02/15/2021, 4:37 PM

## 2021-02-15 NOTE — TOC Progression Note (Signed)
Transition of Care [facility]) - Progression Note    Patient Details  Name: Edward Atkinson MRN: [id number] Date of Birth: 1960-11-25  Transition of Care [facility]) CM/SW [place], [place] Phone Number: 02/15/2021, 9:34 AM  Clinical Narrative:     CSW followed up with Zack with [employer], he reports NIV is approved and ready for when patient discharges, they are still waiting to hear back from insurance about the [place].   Expected Discharge Plan: Home/Self Care Barriers to Discharge: Continued Medical Work up  Expected Discharge Plan and Services Expected Discharge Plan: Home/Self Care     Post Acute Care Choice: NA Living arrangements for the past 2 months: Single Family Home                                       Social Determinants of Health (SDOH) Interventions    Readmission Risk Interventions Readmission Risk Prevention Plan 01/17/2021  Transportation Screening Complete  PCP or Specialist Appt within 3-5 Days Complete  Social Work Consult for [place] Planning/Counseling Complete  Palliative Care Screening Not Applicable  Medication Review Press photographer) Complete  Some recent data might be hidden

## 2021-02-15 NOTE — Progress Notes (Signed)
Nutrition Follow Up Note   DOCUMENTATION CODES:   Severe malnutrition in context of chronic illness  INTERVENTION:   Nepro Shake po TID, each supplement provides 425 kcal and 19 grams protein  MVI po daily   NUTRITION DIAGNOSIS:   Severe Malnutrition related to chronic illness (lymphoma, ILD) as evidenced by severe fat depletion, severe muscle depletion, 16 percent weight loss in <3 months.  GOAL:   Patient will meet greater than or equal to 90% of their needs -progressing   MONITOR:   PO intake, Supplement acceptance, Labs, Weight trends, Skin, I & O's  ASSESSMENT:   60 y/o male with h/o ILD, COVID19, T2DM, PE on eliquis and lymphoma s/p Rituxan which is completed who is admitted with PNA and sepsis.  Pt continues to have good appetite and oral intake in hospital; pt eating 100% of meals in hospital and is drinking Nepro supplements. Per chart, pt appears fairly weight stable since admission.   Medications reviewed and include: dexamethasone, colace, insulin, MVI, cellcept, miralax, bactrim  Labs reviewed: cbgs- 290, 296 x 24 hrs  Diet Order:    Diet Order             Diet Carb Modified Fluid consistency: Thin; Room service appropriate? Yes  Diet effective now                  EDUCATION NEEDS:   Education needs have been addressed  Skin:  Skin Assessment: Reviewed RN Assessment  Last BM:  10/17- type 4  Height:   Ht Readings from Last 1 Encounters:  01/14/21 6' (1.829 m)    Weight:   Wt Readings from Last 1 Encounters:  02/15/21 68.8 kg    Ideal Body Weight:  80.9 kg  BMI:  Body mass index is 20.57 kg/m.  Estimated Nutritional Needs:   Kcal:  2200-2500kcal/day  Protein:  110-125g/day  Fluid:  2.0-2.3L/day  Koleen Distance MS, RD, LDN Please refer to [facility] for RD and/or RD on-call/weekend/after hours pager

## 2021-02-15 NOTE — Progress Notes (Signed)
Pulmonary Medicine          Date: 02/15/2021,   MRN# [id number] DEX BLAKELY 12-31-1960     AdmissionWeight: 63.5 kg                 CurrentWeight: 68.8 kg   Referring physician: Dr Roderic Palau   CHIEF COMPLAINT:   Acute on chronic hypoxemic respiratory failure   HISTORY OF PRESENT ILLNESS   60 yo M w/hx of chronic hypoxemia and ILD post COVID19, T2DM, PE on eliquis, Lymphoma s/p Rituxan which is completed.  Came in due to worsening SOB and DOE specifically mild exertional defacation/urination with feelings of presyncope and severe dyspnea which started to get worse post tapering from steroid appx few days prior to onset of symptom progression.  He was placed on solumedrol and reported mild improvement.  He is on 10L /min [place] during my evaluation.  He had CT chest done with PE protocol and noted to have bilateral GGO infiltrates worse compared to June study. PCCM consultation for further evaluation and management.     01/17/21-  patient seems to be slighly clinically worse post reduction of IV steroids yesterday indicative of ongoing ILD exacerbation, thus far infectious workup is negative but inflammatory biomarkers are highly elevated also pointing towards ILD exacerbation.  There is new CXR in process this morning for interval changes. We discussed increasing steroids slightly and giving more time.  I will also initiate steroid sparing immunomodulation with BID cellcept as patient will likely need therapy for prolonged time period.  Compensatory tachycardia noted appreciate cardiology team.  Sugars with peaks and valleys with adjusted steroid therapy.  Patient with muscle wasting and protein calorie malnutrition but eating better this am.  01/18/21- patient is on HFNC at 60%, feels better, has not been able to tolerate BIPAP.  Wife is at bedside.  BP is borderline low MAP 76.  He has negative infectious workup and thus far ILD exacerbation seems to be main differential. He  continues to have peaks and valleys in blood glucose.  We started steroid sparing regimen with cellcept bid 250 mg and will increase dose as we taper off prednisone.  Remains tachyarrtythmic but improved. Appreciate everyone involved.     01/19/21- patient seen and examined at bedside.  Wife present during evaluation.  Ow HFNC weaned to 41%/45L/MIN.  CRP is still up, have increased cellcept and reducing solumedrol.  Patient felt stronger physically and was able to get OOB to chair. Cough is less now.  He is non-labored with breathing no accessory mm use today. He had dark stools with reduced hb, FOBT ordred.  Haptoglobin is elevated. Pathology slide review ordered.   01/20/21- patient is essentially unchanged from yesterday from pulmonary perspective. CXR also unchanged,  bloodwork with reduction in CRP.  Plan to wean O2 and continue OOB with PT/OT,  Continue current steroid dose and Cellcept.   01/21/21- patient is stable on HFNC.  He had small set back with increased O2 req after exerting himself in bathroom.  HFNC from 41/45 to 54/45.   He diuresed very well with lasix 20 bid >2L urine in past 24h.  Tachycardia is improved. He does not feel weaker and infact states he feels slowly improved.   01/22/21- I spoke to patient regarding goals of care and recommend DNR.  He is agreeable but I asked him to discuss with wife and family out of respect and we will revisit tommorow.  He states he is feeling better  and CRP is trending down but he remains on HFNC 60/40.  He was able to get OOB today and walked to bathroom but experienced mild desaturation with quick recovery "not much issues".      01/23/21- patient is still on high flow nasal canula , he is on 56%/45L.  We reviewed code status and he is still in discussion with family regarding DNR.   We discussed lung transplant per family request and this may be a future option if he is strong enough and recovers.  I have increased insluin TID to 20 from 15 and  increased bactrim to bid SS   01/24/21- patient weaned on HFNC to 49/45, he was able to use BIPAP overnight thinks its helping.  CXR this AM with interval improvement. Will keep on current dose of meds.  Repeat CRP in am.  Last BM yesterday well formed no blood, eating good, urine output very good on 20 bid lasix. Getting OOB with  transient desaturation.  Overall slight improvement.    01/25/21- patient has weaned down to 35%/40L/min.  He remains full code. Reviewed medical plan with Dr Jimmye Norman today.  Reduced steroids from 50 BID to 40 BID and also reduced insulin slightly.    01/26/21- patient is further weaned to 15L humidified HF nasal canula.  He did PT ROM exercises with mild desaturation. CXR today with expiratory image unable to see much interval changes. Patient reports improvement clinically. Plan to continue current regimen. Sugars slightly high due to steroids, will wean steroids soon. Continue cellcept as current 500 bid  01/27/21- patient is relatively same as yesterday, his O2 is weaned down to 14L.  He remains on IV solumedrol at 50 bid, we did discuss weaning this further to 40 BID. Will keep cellcept at current dose 500 bid.  His ankles have been down in seated position and are edematous on examination. He continues to participate with PT and feels good subjectively. We discussed starting dose of lasix for pedal edema.    01/28/21- patient examined at bedside, s/p PT/OT session with slight dyspnea.  He was weaned to 10L O2 but then went back up to 13 post PT due to increased WOB.  He has complete resolution of LE edema and appears to be euvolemic at this point. We discussed reducing soluemdrol today to 20 bid.  I reviewed plan with Dr Leslye Peer today.  01/29/21- patient examined at bedside.  He had a good day and feels better. We reviewed medical plan and are reducing steroids to PO prednisone starting at 1m daily.   01/30/21-  Patient seen and examined at bedside.  CXR this am with  worsening pulmonary edema, included below.  Vitals stable with mild tachypnea and tachycardia with activity. Will deliver 1 dose IV lasix 20 today. Plan to continue Pred PO and cellcept at 500 bid. Pulmonary intensive PT with IS and will initiate Metaneb therapy with albuterol. Blood work today will be repeated CRP CMP phos procal ESR.  Patient is agreeable to BIPAP QHS and Metaneb BID.   01/31/21-  patient had set back yesterday with mild flash pulm edema on CXR. He diuresed very well overnight and feels back to where he was at previously.  He had transient reactive leukocytosis as well.  We initiated empirically zosyn and zithromax on top of bid bactrim.  This probably could be dcd since he recovered so quickly which would not occur with pneumonia.  Ill go head and dc this today but continue PO bactrim.  Will stay on pred 50 today with cellcept at 500 bid as previous.  His O2 has been reduced from 95 to 70% overnight and hes working with PT today. Hopefully we can wean this back down to 10L as he was previosly then back to his home setting.   02/01/21- patient is somewhat better today, he is being weaned on O2 and is now down to 40%. He had BM today. Leaving prednisone at current dose and cxr today.   02/02/21- patient is now on 15L HF bubbler.  He reports feeling better.  He had 3 bowel movements after stool softner. He is working with PT/OT.  His long term prognosis is poor unfortunately and we appreciate collaboration with Palliative care team.   02/03/21- patient had over exerted himself again and required increased O2. He states he feels better and just did more then he should have with PT.  His wbc count is trending down. His CRP is trending down.  I have started morhpine for air hunger and pain at sacrum for skin breakdown and pressure ulceration.     02/04/21- patient is unchanged.  He wants to keep fighting to improve.  CRP is trending down again.  ESR is upward trending. He was unable to perform  adequately with PT and had severe dyspnea and desaturation just to get OOB to chair. Discussed case with [employer] to see if he can have HFNC at home but the device has been recalled. Discussed patient with Dr Roosevelt Locks plan is for additional diuresis.  I have increased cellcept to 750 BID today  02/05/21- patient with UOP >2L yesterday, his spO2 req remains HFNC 35/40.  He feels well and wants to keep working to be discharged to rehab. We have ordered NIV for him.  Patient has pulmonary fibrosis with restrictive lung disease and would benefit from device such as Trilogy 100/200 with O2 bleed in.  His overall prognosis is poor.   02/06/21- patient is back up to 50%/45L.  He clinically feels well but just not able to get off the oxygen or improve as well as we had hoped.  I had lengthy discussion with patient he wishes to continue current inpatient therapy and wants to be more aggressive medically.  I have increased steroids to solumedrol 40 bid again.    02/07/21- patient is unchanged, he states he feels well.  Sitting up in chair speaking infull sentences but still rquiring HFNC at 50%/45L.  Today will perform HRCT to evaluate details of pulmonary parenchyma.   02/08/21- patient s/p HRCT with bilateral pulmonary fibrosis and bronchiectasis.  We discussed [employer] VEST therapy.  Reviewed with oncology will plan to remove chest port to allow patient to use vest therapy.  He is working with PT to wean O2.   02/09/21- patient weaned to 9L/min.  For port removal today.   02/10/21- patient weaned to 6L min.  Reviewed care plan with patient and attending physician   02/11/21- patient stable overnight.  On 6L/min [place].  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    02/12/21- patient was approved for NIV at home.  He is down to 3L/min [place].   He is in process of transitioning to PO dexamethasone.   He is working with PT.   02/13/21- patient with tapering protocol today, were tapering  steroids slower due to exacerbation re-flare post decreased dosing and PO transition previosly.  Plan for Solumedrol 10 bid IV plus dexamethasone 10 bid PO and repeat CXR today.  02/14/21- patient did well overnight with transition to PO regiment using Dexamethasone. Will dc IV solumedrol completely today.   02/15/21- patient feels stable with chronic dyspnea. He is on 4L/min [place]. He is excited that things are improving slowly. Starting metaneb today q8h. Continue dexamethasone    PAST MEDICAL HISTORY   Past Medical History:  Diagnosis Date  . Chronic respiratory failure with hypoxia (HCC)    2L [place] continuous  . COVID-19   . Diabetes mellitus without complication ([place])   . Diverticulitis   . Follicular lymphoma of intra-abdominal lymph nodes ([place]) 02/15/2019  . ILD (interstitial lung disease) ([place])      SURGICAL HISTORY   Past Surgical History:  Procedure Laterality Date  . CHOLECYSTECTOMY    . COLON RESECTION     DUE TO DIVERTICULITIS  . FLEXIBLE BRONCHOSCOPY N/A 12/02/2019   Procedure: FLEXIBLE BRONCHOSCOPY;  Surgeon: Ottie Glazier, MD;  Location: [facility];  Service: Thoracic;  Laterality: N/A;  . PORTA CATH INSERTION N/A 03/11/2019   Procedure: PORTA CATH INSERTION;  Surgeon: Algernon Huxley, MD;  Location: [facility];  Service: Cardiovascular;  Laterality: N/A;  . PORTA CATH REMOVAL Right 02/09/2021   Procedure: PORTA CATH REMOVAL;  Surgeon: Katha Cabal, MD;  Location: [facility];  Service: Cardiovascular;  Laterality: Right;  . PULMONARY THROMBECTOMY N/A 01/29/2019   Procedure: PULMONARY THROMBECTOMY;  Surgeon: Algernon Huxley, MD;  Location: [place];  Service: Cardiovascular;  Laterality: N/A;     FAMILY HISTORY   Family History  Problem Relation Age of Onset  . Diabetes Brother      SOCIAL HISTORY   Social History   Tobacco Use  . Smoking status: Never  . Smokeless tobacco: Never  Vaping Use  . Vaping Use: Never used   Substance Use Topics  . Alcohol use: Yes    Alcohol/week: 0.0 - 2.0 standard drinks    Comment: occasional  . Drug use: Never     MEDICATIONS    Home Medication:    Current Medication:  Current Facility-Administered Medications:  .  acetaminophen (TYLENOL) tablet 650 mg, 650 mg, Oral, Q6H PRN, 650 mg at 02/14/21 1553 **OR** acetaminophen (TYLENOL) suppository 650 mg, 650 mg, Rectal, Q6H PRN, Oswald Hillock, RPH .  ALPRAZolam Duanne Moron) tablet 0.25 mg, 0.25 mg, Oral, QHS PRN, Sharen Hones, MD, 0.25 mg at 02/13/21 2148 .  ALPRAZolam Duanne Moron) tablet 0.25 mg, 0.25 mg, Oral, q morning, Sharen Hones, MD, 0.25 mg at 02/15/21 1610 .  apixaban (ELIQUIS) tablet 5 mg, 5 mg, Oral, BID, Beers, Shanon Brow, RPH, 5 mg at 02/15/21 9604 .  Chlorhexidine Gluconate Cloth 2 % PADS 6 each, 6 each, Topical, Daily, Kathie Dike, MD, 6 each at 02/15/21 0835 .  dexamethasone (DECADRON) tablet 10 mg, 10 mg, Oral, Q12H, Ottie Glazier, MD, 10 mg at 02/15/21 0832 .  digoxin (LANOXIN) tablet 0.125 mg, 0.125 mg, Oral, Daily, Memon, Jolaine Artist, MD, 0.125 mg at 02/15/21 0834 .  diltiazem (CARDIZEM CD) 24 hr capsule 120 mg, 120 mg, Oral, Daily, Aveen Stansel, MD, 120 mg at 02/15/21 0833 .  diltiazem (CARDIZEM) injection 10 mg, 10 mg, Intravenous, Q6H PRN, Wyvonnia Dusky, MD, 10 mg at 01/24/21 2328 .  docusate sodium (COLACE) capsule 200 mg, 200 mg, Oral, BID, Wyvonnia Dusky, MD, 200 mg at 02/15/21 5409 .  feeding supplement (NEPRO CARB STEADY) liquid 237 mL, 237 mL, Oral, TID BM, Kathie Dike, MD, Last Rate: 0 mL/hr at 01/21/21 2130,  237 mL at 02/15/21 0835 .  guaiFENesin (MUCINEX) 12 hr tablet 1,200 mg, 1,200 mg, Oral, BID, Kathie Dike, MD, 1,200 mg at 02/15/21 0833 .  guaiFENesin-dextromethorphan (ROBITUSSIN DM) 100-10 MG/5ML syrup 5 mL, 5 mL, Oral, Q4H PRN, Kathie Dike, MD, 5 mL at 02/04/21 0850 .  HYDROmorphone (DILAUDID) injection 1 mg, 1 mg, Intravenous, Once PRN, Stegmayer, Kimberly A,  PA-C .  insulin aspart (novoLOG) injection 0-15 Units, 0-15 Units, Subcutaneous, TID WC, Sharen Hones, MD, 8 Units at 02/15/21 [phone] .  insulin aspart (novoLOG) injection 0-5 Units, 0-5 Units, Subcutaneous, QHS, Sharen Hones, MD, 3 Units at 02/13/21 2158 .  insulin aspart (novoLOG) injection 12 Units, 12 Units, Subcutaneous, TID WC, Jennye Boroughs, MD, 12 Units at 02/15/21 [phone] .  insulin glargine-yfgn (SEMGLEE) injection 18 Units, 18 Units, Subcutaneous, BID, Jennye Boroughs, MD, 18 Units at 02/15/21 0829 .  ipratropium-albuterol (DUONEB) 0.5-2.5 (3) MG/3ML nebulizer solution 3 mL, 3 mL, Nebulization, TID, Jennye Boroughs, MD, 3 mL at 02/15/21 0820 .  lactulose (CHRONULAC) 10 GM/15ML solution 30 g, 30 g, Oral, BID PRN, Leslye Peer, Richard, MD .  levalbuterol [facility]) nebulizer solution 1.25 mg, 1.25 mg, Nebulization, Q4H PRN, Howerter, Justin B, DO, 1.25 mg at 01/17/21 2011 .  morphine 2 MG/ML injection 1 mg, 1 mg, Intravenous, Q3H PRN, Ottie Glazier, MD, 1 mg at 02/11/21 1827 .  multivitamin with minerals tablet 1 tablet, 1 tablet, Oral, Daily, Kathie Dike, MD, 1 tablet at 02/15/21 0832 .  mycophenolate (CELLCEPT) capsule 750 mg, 750 mg, Oral, BID, Lanney Gins, Jaclynne Baldo, MD, 750 mg at 02/15/21 0833 .  ondansetron (ZOFRAN) injection 4 mg, 4 mg, Intravenous, Q6H PRN, Stegmayer, Kimberly A, PA-C .  polyethylene glycol (MIRALAX / GLYCOLAX) packet 17 g, 17 g, Oral, Daily, Leslye Peer, Richard, MD, 17 g at 02/15/21 0831 .  simethicone (MYLICON) chewable tablet 80 mg, 80 mg, Oral, QID PRN, Loletha Grayer, MD, 80 mg at 01/29/21 1411 .  sodium chloride (OCEAN) 0.65 % nasal spray 1 spray, 1 spray, Each Nare, PRN, Loletha Grayer, MD, 1 spray at 01/30/21 2142 .  sodium chloride flush (NS) 0.9 % injection 10-40 mL, 10-40 mL, Intracatheter, Q12H, Kathie Dike, MD, 10 mL at 02/15/21 0834 .  sodium chloride flush (NS) 0.9 % injection 10-40 mL, 10-40 mL, Intracatheter, PRN, Kathie Dike, MD, 10 mL at 01/24/21  0915 .  sulfamethoxazole-trimethoprim (BACTRIM) 400-80 MG per tablet 1 tablet, 1 tablet, Oral, Q12H, Ottie Glazier, MD, 1 tablet at 02/15/21 0833 .  traZODone (DESYREL) tablet 50 mg, 50 mg, Oral, QHS PRN, Sharion Settler, NP, 50 mg at 02/14/21 2257    ALLERGIES   Penicillins     REVIEW OF SYSTEMS    Review of Systems:  Gen:  Denies  fever, sweats, chills weigh loss  HEENT: Denies blurred vision, double vision, ear pain, eye pain, hearing loss, nose bleeds, sore throat Cardiac:  No dizziness, chest pain or heaviness, chest tightness,edema Resp:  admits to severe dyspnea Gi: Denies swallowing difficulty, stomach pain, nausea or vomiting, diarrhea, constipation, bowel incontinence Gu:  Denies bladder incontinence, burning urine Ext:   Denies Joint pain, stiffness or swelling Skin: Denies  skin rash, easy bruising or bleeding or hives Endoc:  Denies polyuria, polydipsia , polyphagia or weight change Psych:   admits to axniety  Other:  All other systems negative   VS: BP 137/88 (BP Location: Right Arm)   Pulse 97   Temp 98 F (36.7 C)   Resp 20   Ht 6' (1.829 m)  Wt 68.8 kg   SpO2 93%   BMI 20.57 kg/m      PHYSICAL EXAM    GENERAL:NAD, no fevers, chills, no weakness no fatigue HEAD: Normocephalic, atraumatic.  EYES: Pupils equal, round, reactive to light. Extraocular muscles intact. No scleral icterus.  MOUTH: Moist mucosal membrane. Dentition intact. No abscess noted.  EAR, NOSE, THROAT: Clear without exudates. No external lesions.  NECK: Supple. No thyromegaly. No nodules. No JVD.  PULMONARY: Bilateral rhonchi improved from previous CARDIOVASCULAR: S1 and S2. Regular rate and rhythm. No murmurs, rubs, or gallops. No edema. Pedal pulses 2+ bilaterally.  GASTROINTESTINAL: Soft, nontender, nondistended. No masses. Positive bowel sounds. No hepatosplenomegaly.  MUSCULOSKELETAL: No swelling, clubbing, or edema. Range of motion full in all extremities.   NEUROLOGIC: Cranial nerves II through XII are intact. No gross focal neurological deficits. Sensation intact. Reflexes intact.  SKIN: No ulceration, lesions, rashes, or cyanosis. Skin warm and dry. Turgor intact.  PSYCHIATRIC: Mood, affect within normal limits. The patient is awake, alert and oriented x 3. Insight, judgment intact.       IMAGING    CT Chest High Resolution  Result Date: 02/07/2021 CLINICAL DATA:  Inpatient. Interstitial lung disease. Worsening dyspnea. Chronic hypoxemia post COVID-19. History of lymphoma treated with Rituxan. EXAM: CT CHEST WITHOUT CONTRAST TECHNIQUE: Multidetector CT imaging of the chest was performed following the standard protocol without intravenous contrast. High resolution imaging of the lungs, as well as inspiratory and expiratory imaging, was performed. COMPARISON:  02/05/2021 chest radiograph. 01/13/2021 chest CT angiogram. FINDINGS: Cardiovascular: Normal heart size. No significant pericardial effusion/thickening. Right internal jugular central venous catheter terminates at the cavoatrial junction. Normal course and caliber of the thoracic aorta. Stable dilated main pulmonary artery (3.7 cm diameter). Mediastinum/Nodes: No discrete thyroid nodules. Unremarkable esophagus. No pathologically enlarged axillary, mediastinal or hilar lymph nodes, noting limited sensitivity for the detection of hilar adenopathy on this noncontrast study. Lungs/Pleura: No pneumothorax. No pleural effusion. Severe patchy confluent peribronchovascular and peripheral ground-glass opacity, reticulation, traction bronchiectasis, architectural distortion and volume loss in both lungs with a basilar predominance. The ground-glass opacities have mildly improved since 01/13/2021 chest CT. The bronchiectasis, distortion and volume loss appear slightly worsened. No significant lobular air trapping or evidence of tracheobronchomalacia on the expiration sequence. No acute consolidative airspace  disease, lung masses or significant pulmonary nodules. No frank honeycombing. Upper abdomen: Chronic mild elevation of the right hemidiaphragm. Musculoskeletal: No aggressive appearing focal osseous lesions. Mild thoracic spondylosis. IMPRESSION: 1. Spectrum of findings compatible with severe basilar predominant fibrotic interstitial lung disease with ground-glass and no frank honeycombing predominance. Ground-glass opacities have slightly improved and bronchiectasis and other findings of fibrosis have slightly worsened since recent 01/13/2021 chest CT angiogram study. Findings are most compatible with a severe rapidly progressive postinflammatory fibrosis or fibrotic NSIP pattern. Findings are suggestive of an alternative diagnosis (not UIP) per consensus guidelines: Diagnosis of Idiopathic Pulmonary Fibrosis: An Official ATS/ERS/JRS/ALAT Clinical Practice Guideline. [place], Iss 5, [id number], Dec 31 2016. 2. Stable dilated main pulmonary artery, suggesting chronic pulmonary arterial hypertension. 3. Chronic mild elevation of the right hemidiaphragm. Electronically Signed   By: Ilona Sorrel M.D.   On: 02/07/2021 17:29   PERIPHERAL VASCULAR CATHETERIZATION  Result Date: 02/09/2021 See surgical note for result.  DG Chest Port 1 View  Result Date: 02/13/2021 CLINICAL DATA:  Atelectasis EXAM: PORTABLE CHEST 1 VIEW COMPARISON:  02/05/2021 FINDINGS: Low lung volumes with similar appearance of interstitial lung disease, left greater than  right. No pleural effusion. Right chest wall port has been removed. Stable cardiomediastinal contours. IMPRESSION: Interval removal of chest port. Interstitial lung disease with similar lung aeration to prior study. Electronically Signed   By: Macy Mis M.D.   On: 02/13/2021 13:21   DG Chest Port 1 View  Result Date: 02/05/2021 CLINICAL DATA:  Bilateral pulmonary infiltrates on chest x-ray. EXAM: PORTABLE CHEST 1 VIEW COMPARISON:  Chest  radiograph 02/01/2021 FINDINGS: Power injectable right IJ port central venous catheter tip projects at the level of the superior cavoatrial junction. Unchanged low lung volumes with lower lobe predominant interstitial opacities, left greater than right. No new focal consolidation, pleural effusion, or pneumothorax. Heart is normal in size. Surgical clips in the right upper abdomen. IMPRESSION: Bilateral interstitial airspace opacities are unchanged compared to 02/01/2021. No new focal consolidation, pleural effusion, or pneumothorax. Electronically Signed   By: Ileana Roup M.D.   On: 02/05/2021 10:59   DG Chest Port 1 View  Result Date: 02/01/2021 CLINICAL DATA:  Shortness of breath, edema EXAM: PORTABLE CHEST 1 VIEW COMPARISON:  01/30/2021 FINDINGS: Stable positioning of right IJ approach Port-A-Cath. Heart size within normal limits. Low lung volumes. Slight interval worsening of bilateral airspace opacities, left worse than right. No pleural effusion or pneumothorax. IMPRESSION: Slight interval worsening of bilateral airspace opacities, left worse than right. Electronically Signed   By: Davina Poke D.O.   On: 02/01/2021 14:37   DG Chest Port 1 View  Result Date: 01/30/2021 CLINICAL DATA:  Acute on chronic hypoxemic respiratory failure. EXAM: PORTABLE CHEST 1 VIEW COMPARISON:  January 28, 2021 FINDINGS: No pneumothorax. Stable cardiomediastinal silhouette and right Port-A-Cath. Left-greater-than-right pulmonary opacities/infiltrates. No other interval changes. IMPRESSION: 1. Persistent low lung volumes and bilateral pulmonary infiltrates. No interval changes. Electronically Signed   By: Dorise Bullion III M.D.   On: 01/30/2021 08:36   DG Chest Port 1 View  Result Date: 01/28/2021 CLINICAL DATA:  Shortness of breath. EXAM: PORTABLE CHEST 1 VIEW COMPARISON:  Multiple recent chest films. FINDINGS: The right IJ power port is stable. Persistent low lung volumes and bilateral pulmonary infiltrates.  No definite pleural effusion or pneumothorax. IMPRESSION: Persistent low lung volumes and bilateral pulmonary infiltrates. Electronically Signed   By: Marijo Sanes M.D.   On: 01/28/2021 16:03   DG Chest Port 1 View  Result Date: 01/26/2021 CLINICAL DATA:  Shortness of breath, abnormal chest x-ray EXAM: PORTABLE CHEST 1 VIEW COMPARISON:  01/24/2021 FINDINGS: Right Port-A-Cath remains in place, unchanged. Low lung volumes with bilateral airspace opacities, most pronounced in the left perihilar and lower lobe, unchanged since prior study. No visible effusions or pneumothorax. Heart is normal size. IMPRESSION: Low lung volumes with bilateral airspace disease, left-greater-than-right. No significant change. Electronically Signed   By: Rolm Baptise M.D.   On: 01/26/2021 09:42   DG Chest Port 1 View  Result Date: 01/24/2021 CLINICAL DATA:  Follow-up multifocal pneumonia. EXAM: PORTABLE CHEST 1 VIEW COMPARISON:  01/22/2021 and older exams. FINDINGS: Mild interval decrease in the airspace lung opacities, most evident on the left. Lung volumes remain low. No new lung abnormalities. No visualized pleural effusion.  No pneumothorax. Right internal jugular Port-A-Cath is stable. IMPRESSION: 1. Mild interval improvement in lung aeration with decreased airspace lung opacities, most notably on the left. Electronically Signed   By: Lajean Manes M.D.   On: 01/24/2021 07:57   DG Chest Port 1 View  Result Date: 01/22/2021 CLINICAL DATA:  Hypoxia, shortness of breath. EXAM: PORTABLE CHEST 1 VIEW  COMPARISON:  January 20, 2021. FINDINGS: Stable cardiomediastinal silhouette. Right internal jugular Port-A-Cath is unchanged in position. Hypoinflation of the lungs is noted. Stable bilateral lung opacities are noted, left greater than right, most consistent with multifocal pneumonia. Bony thorax is unremarkable. IMPRESSION: Hypoinflation of the lungs. Stable bilateral lung opacities are noted, left greater than right, most  consistent with multifocal pneumonia. Electronically Signed   By: Marijo Conception M.D.   On: 01/22/2021 19:26   DG Chest Port 1 View  Result Date: 01/20/2021 CLINICAL DATA:  Pneumonitis EXAM: PORTABLE CHEST 1 VIEW COMPARISON:  01/19/2021 FINDINGS: Shallow inspiration. Patchy infiltrates in the left lung and right lung base, similar to prior study and likely multifocal pneumonia. No pleural effusions. No pneumothorax. Mediastinal contours appear intact. Heart size is normal. Power port type central venous catheter on the right with tip over the cavoatrial junction region. Surgical clips in the right upper quadrant. IMPRESSION: Shallow inspiration. Patchy infiltrates in both lungs, likely multifocal pneumonia, without change. Electronically Signed   By: Lucienne Capers M.D.   On: 01/20/2021 03:56   DG Chest Port 1 View  Result Date: 01/19/2021 CLINICAL DATA:  Abnormality on previous chest x-ray in a 60 year old male. EXAM: PORTABLE CHEST 1 VIEW COMPARISON:  CT from January 13, 2021, chest x-ray January 18, 2021. FINDINGS: RIGHT-sided Port-A-Cath terminates at the caval to atrial junction. Trachea is midline. Cardiomediastinal contours are stable. Interstitial and airspace opacities throughout the chest worse in the LEFT mid to upper chest, RIGHT upper chest lung bases bilaterally with similar appearance. On limited assessment there is no acute skeletal process. EKG leads project over the chest. IMPRESSION: No interval change in the appearance of the chest since previous imaging with continued decreased lung volumes and patchy bilateral opacities. Electronically Signed   By: Zetta Bills M.D.   On: 01/19/2021 07:59   DG Chest Port 1 View  Result Date: 01/18/2021 CLINICAL DATA:  Respiratory distress EXAM: PORTABLE CHEST 1 VIEW COMPARISON:  01/17/2021 FINDINGS: Cardiac shadow is stable. Right-sided chest wall port is again seen. Lungs are hypoinflated. Patchy airspace opacities again identified in both  lungs left greater than right but stable from the prior exam. IMPRESSION: Patchy airspace opacities bilaterally stable from the prior exam. Electronically Signed   By: Inez Catalina M.D.   On: 01/18/2021 17:24   DG Chest Port 1 View  Result Date: 01/17/2021 CLINICAL DATA:  Worsening shortness of breath. EXAM: PORTABLE CHEST 1 VIEW COMPARISON:  01/14/2021 FINDINGS: 0826 hours. Low volume film. Patchy airspace disease again noted in both lungs, left greater than right. Cardiopericardial silhouette is at upper limits of normal for size. Right Port-A-Cath again noted. Telemetry leads overlie the chest. IMPRESSION: Low volume film with patchy bilateral airspace disease, left greater than right. No substantial change. Electronically Signed   By: Misty Stanley M.D.   On: 01/17/2021 08:53           ASSESSMENT/PLAN   Acute on hypoxemic respiratory failure Due to acute exacerbation of Chronic lung disease/ILD with Critical illness Myopathy associated respiratory failure - present on admission  - COVID19 negative   - supplemental O2 during my evaluation 10l/min>>8L>>HFNC 7L -Respiratory viral panel-negative  -serum fungitell-negative  -legionella ab-negative  -nasal MRSA PCR-negative -Procalcitonin trend reviewed -strep pneumoniae ur AG -Histoplasma Ur Ag-negative -sputum resp cultures -AFB sputum expectorated specimen -sputum cytology  -completed  Cefepime course for cap -Reduced Solumedrol to 40 BID - 01/15/21>>50 BID>>40 BID>>30 BID>>20 BID>>pred 50>>soluemdrol 40 iv bid please watch surgars -  reviewed pertinent imaging with patient today - ESR and CRP are elevated but improving -PT/OT for d/c planning  -please encourage patient to use incentive spirometer few times each hour while hospitalized.   -continue Cellcept 250 bid >>500 bid >>750BID -UOP is adequate -Discussed goal of care - recommend DNR patient is in agreement but still discussing with wife, palliative care service to follow   -lasix 20 daily PO     Thank you for allowing me to participate in the care of this patient.   Patient/Family are satisfied with care plan and all questions have been answered.   This document was prepared using Dragon voice recognition software and may include unintentional dictation errors.     Ottie Glazier, M.D.  Division of [facility]

## 2021-02-15 NOTE — Progress Notes (Addendum)
Physical Therapy Treatment Patient Details Name: Edward Atkinson MRN: [id number] DOB: 19-Nov-1960 Today's Date: 02/15/2021   History of Present Illness Pt is a 60 y.o. male with medical history significant for chronic hypoxic respiratory failure on 2 L continuous nasal cannula in the setting of interstitial lung disease, type 2 diabetes mellitus, pulmonary embolism in 2020 now chronically anticoagulated on Eliquis, who arrived to ED with shortness of breath. MD assessment includes:  acute on chronic hypoxic respiratory failure with hypoxia, possible sepsis due to bilateral pneumonia, rapid atrial fibrillation, anemia, and mild elevation of troponin with suspected demand ischemia.    PT Comments    Pt seen for PT tx with pt agreeable & premedicated to help him breathe better. Pt is able to complete bed mobility with mod I but requires min assist for sit>stand & CGA for static standing & stand pivot to recliner. Pt tolerates standing ~30 seconds before fatiguing & requesting seated rest break. Pt requires significantly extra time to recover SpO2.   Pt received on 4L/min via nasal cannula, turned up to 6L/min & SpO2 dropped once sitting EOB so returned supine. Lowest SpO2 noted to be 76-77% during session. Respiratory therapy called to room to allow pt to mobilize more. Pt placed on nonrebreather & over oxygenated (100% x ~1 minute) then participated in standing & transferring to recliner. SpO2 dropped to 83% & pt required extra time to perform pursed lip breathing & decrease RR. Pt then placed on 4L/min via nasal cannula. Pt drops again to 83% with quick change from non rebreather to nasal cannula. RT (Bambi) recommends placing nonrebreather on over nasal cannula to allow pt to mobilize without having quick change to cannula. Will try to coordinate next session with RT therapy to safely progress pt more.   Addendum: Pt's goals remain appropriate at this time. Dates for goals have been updated.       Recommendations for follow up therapy are one component of a multi-disciplinary discharge planning process, led by the attending physician.  Recommendations may be updated based on patient status, additional functional criteria and insurance authorization.  Follow Up Recommendations  Home health PT;Supervision for mobility/OOB     Equipment Recommendations  Rolling walker with 5" wheels;3in1 (PT)    Recommendations for Other Services       Precautions / Restrictions Precautions Precautions: Fall Precaution Comments: monitor O2 - will drop <70 with seated therex     Mobility  Bed Mobility Overal bed mobility: Modified Independent             General bed mobility comments: supine<>sit with mod I (HOB elevated, bed rails)    Transfers Overall transfer level: Needs assistance Equipment used: Rolling walker (2 wheeled) Transfers: Sit to/from [facility] Sit to Stand: Min assist Stand pivot transfers: Min guard       General transfer comment: Pt is able to stand & take a few steps to the recliner with CGA & RW.  Ambulation/Gait                 Stairs             Wheelchair Mobility    Modified Rankin (Stroke Patients Only)       Balance Overall balance assessment: Needs assistance Sitting-balance support: Feet supported;No upper extremity supported Sitting balance-Leahy Scale: Normal     Standing balance support: During functional activity;Bilateral upper extremity supported Standing balance-Leahy Scale: Fair Standing balance comment: BUE support on RW, CGA  Cognition Arousal/Alertness: Awake/alert Behavior During Therapy: WFL for tasks assessed/performed Overall Cognitive Status: Within Functional Limits for tasks assessed                                 General Comments: Very pleasant & agreeable to tx.      Exercises      General Comments        Pertinent  Vitals/Pain Pain Assessment: No/denies pain    Home Living                      Prior Function            PT Goals (current goals can now be found in the care plan section) Acute Rehab PT Goals Patient Stated Goal: walk again without being so SOB PT Goal Formulation: With patient Time For Goal Achievement: 03/02/21 Potential to Achieve Goals: Fair Progress towards PT goals: Progressing toward goals    Frequency    Min 2X/week      PT Plan Current plan remains appropriate    Co-evaluation              AM-PAC PT "6 Clicks" Mobility   Outcome Measure  Help needed turning from your back to your side while in a flat bed without using bedrails?: None Help needed moving from lying on your back to sitting on the side of a flat bed without using bedrails?: None Help needed moving to and from a bed to a chair (including a wheelchair)?: A Little Help needed standing up from a chair using your arms (e.g., wheelchair or bedside chair)?: A Little Help needed to walk in hospital room?: A Little Help needed climbing 3-5 steps with a railing? : A Lot 6 Click Score: 19    End of Session Equipment Utilized During Treatment: Oxygen Activity Tolerance: Patient tolerated treatment well;Patient limited by fatigue;Treatment limited secondary to medical complications (Comment) (O2) Patient left: in chair;with call bell/phone within reach (respiratory therapist present)   PT Visit Diagnosis: Muscle weakness (generalized) (M62.81);Difficulty in walking, not elsewhere classified (R26.2)     Time: 3254-9826 PT Time Calculation (min) (ACUTE ONLY): 28 min  Charges:  $Therapeutic Activity: 23-37 mins                     Edward Atkinson, PT, DPT 02/15/21, 4:13 PM    Edward Atkinson 02/15/2021, 4:08 PM

## 2021-02-16 LAB — C-REACTIVE PROTEIN: CRP: <0.5 mg/dL (ref <1.0)

## 2021-02-16 LAB — GLUCOSE, CAPILLARY
Glucose-Capillary: 199 mg/dL — ABNORMAL HIGH (ref 70–99)
Glucose-Capillary: 448 mg/dL — ABNORMAL HIGH (ref 70–99)
Glucose-Capillary: 463 mg/dL — ABNORMAL HIGH (ref 70–99)
Glucose-Capillary: 175 mg/dL — ABNORMAL HIGH (ref 70–99)
Glucose-Capillary: 130 mg/dL — ABNORMAL HIGH (ref 70–99)

## 2021-02-16 LAB — SEDIMENTATION RATE: Sed Rate: 2 mm/hr (ref 0–20)

## 2021-02-16 MED ORDER — INSULIN GLARGINE-YFGN 100 UNIT/ML ~~LOC~~ SOLN
15.0000 [IU] | Freq: Two times a day (BID) | SUBCUTANEOUS | Status: DC
  Administered 2021-02-16 – 2021-02-19 (×6): 15 [IU] via SUBCUTANEOUS
  Filled 2021-02-16 (×8): qty 0.15

## 2021-02-16 MED ORDER — INSULIN ASPART 100 UNIT/ML IJ SOLN
10.0000 [IU] | Freq: Three times a day (TID) | INTRAMUSCULAR | Status: DC
  Administered 2021-02-16 – 2021-02-19 (×8): 10 [IU] via SUBCUTANEOUS
  Filled 2021-02-16 (×8): qty 1

## 2021-02-16 MED ORDER — DEXAMETHASONE 4 MG PO TABS
8.0000 mg | ORAL_TABLET | Freq: Two times a day (BID) | ORAL | Status: DC
  Administered 2021-02-16 – 2021-02-17 (×3): 8 mg via ORAL
  Filled 2021-02-16 (×4): qty 2

## 2021-02-16 MED ORDER — INSULIN GLARGINE-YFGN 100 UNIT/ML ~~LOC~~ SOLN
10.0000 [IU] | Freq: Two times a day (BID) | SUBCUTANEOUS | Status: DC
  Administered 2021-02-16: 10 [IU] via SUBCUTANEOUS
  Filled 2021-02-16 (×3): qty 0.1

## 2021-02-16 MED ORDER — INSULIN ASPART 100 UNIT/ML IJ SOLN
5.0000 [IU] | Freq: Three times a day (TID) | INTRAMUSCULAR | Status: DC
  Administered 2021-02-16 (×2): 5 [IU] via SUBCUTANEOUS
  Filled 2021-02-16 (×2): qty 1

## 2021-02-16 NOTE — Progress Notes (Signed)
Pulmonary Medicine          Date: 02/16/2021,   MRN# [id number] Edward Atkinson 08-14-1960     AdmissionWeight: 63.5 kg                 CurrentWeight: 67.4 kg   Referring physician: Dr Roderic Palau   CHIEF COMPLAINT:   Acute on chronic hypoxemic respiratory failure   HISTORY OF PRESENT ILLNESS   60 yo M w/hx of chronic hypoxemia and ILD post COVID19, T2DM, PE on eliquis, Lymphoma s/p Rituxan which is completed.  Came in due to worsening SOB and DOE specifically mild exertional defacation/urination with feelings of presyncope and severe dyspnea which started to get worse post tapering from steroid appx few days prior to onset of symptom progression.  He was placed on solumedrol and reported mild improvement.  He is on 10L /min [place] during my evaluation.  He had CT chest done with PE protocol and noted to have bilateral GGO infiltrates worse compared to June study. PCCM consultation for further evaluation and management.     01/17/21-  patient seems to be slighly clinically worse post reduction of IV steroids yesterday indicative of ongoing ILD exacerbation, thus far infectious workup is negative but inflammatory biomarkers are highly elevated also pointing towards ILD exacerbation.  There is new CXR in process this morning for interval changes. We discussed increasing steroids slightly and giving more time.  I will also initiate steroid sparing immunomodulation with BID cellcept as patient will likely need therapy for prolonged time period.  Compensatory tachycardia noted appreciate cardiology team.  Sugars with peaks and valleys with adjusted steroid therapy.  Patient with muscle wasting and protein calorie malnutrition but eating better this am.  01/18/21- patient is on HFNC at 60%, feels better, has not been able to tolerate BIPAP.  Wife is at bedside.  BP is borderline low MAP 76.  He has negative infectious workup and thus far ILD exacerbation seems to be main differential. He  continues to have peaks and valleys in blood glucose.  We started steroid sparing regimen with cellcept bid 250 mg and will increase dose as we taper off prednisone.  Remains tachyarrtythmic but improved. Appreciate everyone involved.     01/19/21- patient seen and examined at bedside.  Wife present during evaluation.  Ow HFNC weaned to 41%/45L/MIN.  CRP is still up, have increased cellcept and reducing solumedrol.  Patient felt stronger physically and was able to get OOB to chair. Cough is less now.  He is non-labored with breathing no accessory mm use today. He had dark stools with reduced hb, FOBT ordred.  Haptoglobin is elevated. Pathology slide review ordered.   01/20/21- patient is essentially unchanged from yesterday from pulmonary perspective. CXR also unchanged,  bloodwork with reduction in CRP.  Plan to wean O2 and continue OOB with PT/OT,  Continue current steroid dose and Cellcept.   01/21/21- patient is stable on HFNC.  He had small set back with increased O2 req after exerting himself in bathroom.  HFNC from 41/45 to 54/45.   He diuresed very well with lasix 20 bid >2L urine in past 24h.  Tachycardia is improved. He does not feel weaker and infact states he feels slowly improved.   01/22/21- I spoke to patient regarding goals of care and recommend DNR.  He is agreeable but I asked him to discuss with wife and family out of respect and we will revisit tommorow.  He states he is feeling better  and CRP is trending down but he remains on HFNC 60/40.  He was able to get OOB today and walked to bathroom but experienced mild desaturation with quick recovery "not much issues".      01/23/21- patient is still on high flow nasal canula , he is on 56%/45L.  We reviewed code status and he is still in discussion with family regarding DNR.   We discussed lung transplant per family request and this may be a future option if he is strong enough and recovers.  I have increased insluin TID to 20 from 15 and  increased bactrim to bid SS   01/24/21- patient weaned on HFNC to 49/45, he was able to use BIPAP overnight thinks its helping.  CXR this AM with interval improvement. Will keep on current dose of meds.  Repeat CRP in am.  Last BM yesterday well formed no blood, eating good, urine output very good on 20 bid lasix. Getting OOB with  transient desaturation.  Overall slight improvement.    01/25/21- patient has weaned down to 35%/40L/min.  He remains full code. Reviewed medical plan with Dr Jimmye Norman today.  Reduced steroids from 50 BID to 40 BID and also reduced insulin slightly.    01/26/21- patient is further weaned to 15L humidified HF nasal canula.  He did PT ROM exercises with mild desaturation. CXR today with expiratory image unable to see much interval changes. Patient reports improvement clinically. Plan to continue current regimen. Sugars slightly high due to steroids, will wean steroids soon. Continue cellcept as current 500 bid  01/27/21- patient is relatively same as yesterday, his O2 is weaned down to 14L.  He remains on IV solumedrol at 50 bid, we did discuss weaning this further to 40 BID. Will keep cellcept at current dose 500 bid.  His ankles have been down in seated position and are edematous on examination. He continues to participate with PT and feels good subjectively. We discussed starting dose of lasix for pedal edema.    01/28/21- patient examined at bedside, s/p PT/OT session with slight dyspnea.  He was weaned to 10L O2 but then went back up to 13 post PT due to increased WOB.  He has complete resolution of LE edema and appears to be euvolemic at this point. We discussed reducing soluemdrol today to 20 bid.  I reviewed plan with Dr Leslye Peer today.  01/29/21- patient examined at bedside.  He had a good day and feels better. We reviewed medical plan and are reducing steroids to PO prednisone starting at 1m daily.   01/30/21-  Patient seen and examined at bedside.  CXR this am with  worsening pulmonary edema, included below.  Vitals stable with mild tachypnea and tachycardia with activity. Will deliver 1 dose IV lasix 20 today. Plan to continue Pred PO and cellcept at 500 bid. Pulmonary intensive PT with IS and will initiate Metaneb therapy with albuterol. Blood work today will be repeated CRP CMP phos procal ESR.  Patient is agreeable to BIPAP QHS and Metaneb BID.   01/31/21-  patient had set back yesterday with mild flash pulm edema on CXR. He diuresed very well overnight and feels back to where he was at previously.  He had transient reactive leukocytosis as well.  We initiated empirically zosyn and zithromax on top of bid bactrim.  This probably could be dcd since he recovered so quickly which would not occur with pneumonia.  Ill go head and dc this today but continue PO bactrim.  Will stay on pred 50 today with cellcept at 500 bid as previous.  His O2 has been reduced from 95 to 70% overnight and hes working with PT today. Hopefully we can wean this back down to 10L as he was previosly then back to his home setting.   02/01/21- patient is somewhat better today, he is being weaned on O2 and is now down to 40%. He had BM today. Leaving prednisone at current dose and cxr today.   02/02/21- patient is now on 15L HF bubbler.  He reports feeling better.  He had 3 bowel movements after stool softner. He is working with PT/OT.  His long term prognosis is poor unfortunately and we appreciate collaboration with Palliative care team.   02/03/21- patient had over exerted himself again and required increased O2. He states he feels better and just did more then he should have with PT.  His wbc count is trending down. His CRP is trending down.  I have started morhpine for air hunger and pain at sacrum for skin breakdown and pressure ulceration.     02/04/21- patient is unchanged.  He wants to keep fighting to improve.  CRP is trending down again.  ESR is upward trending. He was unable to perform  adequately with PT and had severe dyspnea and desaturation just to get OOB to chair. Discussed case with [employer] to see if he can have HFNC at home but the device has been recalled. Discussed patient with Dr Roosevelt Locks plan is for additional diuresis.  I have increased cellcept to 750 BID today  02/05/21- patient with UOP >2L yesterday, his spO2 req remains HFNC 35/40.  He feels well and wants to keep working to be discharged to rehab. We have ordered NIV for him.  Patient has pulmonary fibrosis with restrictive lung disease and would benefit from device such as Trilogy 100/200 with O2 bleed in.  His overall prognosis is poor.   02/06/21- patient is back up to 50%/45L.  He clinically feels well but just not able to get off the oxygen or improve as well as we had hoped.  I had lengthy discussion with patient he wishes to continue current inpatient therapy and wants to be more aggressive medically.  I have increased steroids to solumedrol 40 bid again.    02/07/21- patient is unchanged, he states he feels well.  Sitting up in chair speaking infull sentences but still rquiring HFNC at 50%/45L.  Today will perform HRCT to evaluate details of pulmonary parenchyma.   02/08/21- patient s/p HRCT with bilateral pulmonary fibrosis and bronchiectasis.  We discussed [employer] VEST therapy.  Reviewed with oncology will plan to remove chest port to allow patient to use vest therapy.  He is working with PT to wean O2.   02/09/21- patient weaned to 9L/min.  For port removal today.   02/10/21- patient weaned to 6L min.  Reviewed care plan with patient and attending physician   02/11/21- patient stable overnight.  On 6L/min [place].  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    02/12/21- patient was approved for NIV at home.  He is down to 3L/min [place].   He is in process of transitioning to PO dexamethasone.   He is working with PT.   02/13/21- patient with tapering protocol today, were tapering  steroids slower due to exacerbation re-flare post decreased dosing and PO transition previosly.  Plan for Solumedrol 10 bid IV plus dexamethasone 10 bid PO and repeat CXR today.  02/14/21- patient did well overnight with transition to PO regiment using Dexamethasone. Will dc IV solumedrol completely today.   02/15/21- patient feels stable with chronic dyspnea. He is on 4L/min [place]. He is excited that things are improving slowly. Starting metaneb today q8h. Continue dexamethasone  02/16/21- goal to dc Friday to home with PT. Reducing dexamethasone today to 6 bid    PAST MEDICAL HISTORY   Past Medical History:  Diagnosis Date  . Chronic respiratory failure with hypoxia (HCC)    2L [place] continuous  . COVID-19   . Diabetes mellitus without complication ([place])   . Diverticulitis   . Follicular lymphoma of intra-abdominal lymph nodes ([place]) 02/15/2019  . ILD (interstitial lung disease) ([place])      SURGICAL HISTORY   Past Surgical History:  Procedure Laterality Date  . CHOLECYSTECTOMY    . COLON RESECTION     DUE TO DIVERTICULITIS  . FLEXIBLE BRONCHOSCOPY N/A 12/02/2019   Procedure: FLEXIBLE BRONCHOSCOPY;  Surgeon: Ottie Glazier, MD;  Location: [facility];  Service: Thoracic;  Laterality: N/A;  . PORTA CATH INSERTION N/A 03/11/2019   Procedure: PORTA CATH INSERTION;  Surgeon: Algernon Huxley, MD;  Location: [place] [facility];  Service: Cardiovascular;  Laterality: N/A;  . PORTA CATH REMOVAL Right 02/09/2021   Procedure: PORTA CATH REMOVAL;  Surgeon: Katha Cabal, MD;  Location: [place] [facility];  Service: Cardiovascular;  Laterality: Right;  . PULMONARY THROMBECTOMY N/A 01/29/2019   Procedure: PULMONARY THROMBECTOMY;  Surgeon: Algernon Huxley, MD;  Location: [place] [facility];  Service: Cardiovascular;  Laterality: N/A;     FAMILY HISTORY   Family History  Problem Relation Age of Onset  . Diabetes Brother      SOCIAL HISTORY   Social History   Tobacco Use  . Smoking  status: Never  . Smokeless tobacco: Never  Vaping Use  . Vaping Use: Never used  Substance Use Topics  . Alcohol use: Yes    Alcohol/week: 0.0 - 2.0 standard drinks    Comment: occasional  . Drug use: Never     MEDICATIONS    Home Medication:    Current Medication:  Current Facility-Administered Medications:  .  acetaminophen (TYLENOL) tablet 650 mg, 650 mg, Oral, Q6H PRN, 650 mg at 02/16/21 0726 **OR** acetaminophen (TYLENOL) suppository 650 mg, 650 mg, Rectal, Q6H PRN, Oswald Hillock, RPH .  ALPRAZolam Duanne Moron) tablet 0.25 mg, 0.25 mg, Oral, QHS PRN, Sharen Hones, MD, 0.25 mg at 02/13/21 2148 .  ALPRAZolam Duanne Moron) tablet 0.25 mg, 0.25 mg, Oral, q morning, Sharen Hones, MD, 0.25 mg at 02/15/21 5027 .  apixaban (ELIQUIS) tablet 5 mg, 5 mg, Oral, BID, Beers, Shanon Brow, RPH, 5 mg at 02/15/21 2259 .  dexamethasone (DECADRON) tablet 10 mg, 10 mg, Oral, Q12H, Ottie Glazier, MD, 10 mg at 02/15/21 2257 .  digoxin (LANOXIN) tablet 0.125 mg, 0.125 mg, Oral, Daily, Memon, Jolaine Artist, MD, 0.125 mg at 02/15/21 0834 .  diltiazem (CARDIZEM CD) 24 hr capsule 120 mg, 120 mg, Oral, Daily, Shelbylynn Walczyk, MD, 120 mg at 02/15/21 0833 .  diltiazem (CARDIZEM) injection 10 mg, 10 mg, Intravenous, Q6H PRN, Wyvonnia Dusky, MD, 10 mg at 01/24/21 2328 .  docusate sodium (COLACE) capsule 200 mg, 200 mg, Oral, BID, Wyvonnia Dusky, MD, 200 mg at 02/15/21 2258 .  feeding supplement (NEPRO CARB STEADY) liquid 237 mL, 237 mL, Oral, TID BM, Memon, Jehanzeb, MD, Last Rate: 0 mL/hr at 01/21/21 2130, 237 mL at 02/15/21 2303 .  guaiFENesin (MUCINEX) 12 hr tablet 1,200 mg, 1,200 mg, Oral, BID, Kathie Dike, MD, 1,200 mg at 02/15/21 2258 .  guaiFENesin-dextromethorphan (ROBITUSSIN DM) 100-10 MG/5ML syrup 5 mL, 5 mL, Oral, Q4H PRN, Kathie Dike, MD, 5 mL at 02/04/21 0850 .  HYDROmorphone (DILAUDID) injection 1 mg, 1 mg, Intravenous, Once PRN, Stegmayer, Kimberly A, PA-C .  insulin aspart (novoLOG)  injection 0-15 Units, 0-15 Units, Subcutaneous, TID WC, Sharen Hones, MD, 3 Units at 02/15/21 1751 .  insulin aspart (novoLOG) injection 0-5 Units, 0-5 Units, Subcutaneous, QHS, Sharen Hones, MD, 3 Units at 02/13/21 2158 .  insulin aspart (novoLOG) injection 5 Units, 5 Units, Subcutaneous, TID WC, Jennye Boroughs, MD .  insulin glargine-yfgn (SEMGLEE) injection 10 Units, 10 Units, Subcutaneous, BID, Jennye Boroughs, MD .  ipratropium-albuterol (DUONEB) 0.5-2.5 (3) MG/3ML nebulizer solution 3 mL, 3 mL, Nebulization, TID, Jennye Boroughs, MD, 3 mL at 02/16/21 0816 .  lactulose (CHRONULAC) 10 GM/15ML solution 30 g, 30 g, Oral, BID PRN, Leslye Peer, Richard, MD .  levalbuterol [facility]) nebulizer solution 1.25 mg, 1.25 mg, Nebulization, Q4H PRN, Howerter, Justin B, DO, 1.25 mg at 01/17/21 2011 .  morphine 2 MG/ML injection 1 mg, 1 mg, Intravenous, Q3H PRN, Ottie Glazier, MD, 1 mg at 02/15/21 1358 .  multivitamin with minerals tablet 1 tablet, 1 tablet, Oral, Daily, Kathie Dike, MD, 1 tablet at 02/15/21 0832 .  mycophenolate (CELLCEPT) capsule 750 mg, 750 mg, Oral, BID, Lanney Gins, Carden Teel, MD, 750 mg at 02/15/21 2258 .  ondansetron (ZOFRAN) injection 4 mg, 4 mg, Intravenous, Q6H PRN, Stegmayer, Kimberly A, PA-C .  polyethylene glycol (MIRALAX / GLYCOLAX) packet 17 g, 17 g, Oral, Daily, Leslye Peer, Richard, MD, 17 g at 02/15/21 0831 .  simethicone (MYLICON) chewable tablet 80 mg, 80 mg, Oral, QID PRN, Loletha Grayer, MD, 80 mg at 01/29/21 1411 .  sodium chloride (OCEAN) 0.65 % nasal spray 1 spray, 1 spray, Each Nare, PRN, Loletha Grayer, MD, 1 spray at 01/30/21 2142 .  sulfamethoxazole-trimethoprim (BACTRIM) 400-80 MG per tablet 1 tablet, 1 tablet, Oral, Q12H, Ottie Glazier, MD, 1 tablet at 02/15/21 2257 .  traZODone (DESYREL) tablet 50 mg, 50 mg, Oral, QHS PRN, Sharion Settler, NP, 50 mg at 02/15/21 2302    ALLERGIES   Penicillins     REVIEW OF SYSTEMS    Review of Systems:  Gen:  Denies   fever, sweats, chills weigh loss  HEENT: Denies blurred vision, double vision, ear pain, eye pain, hearing loss, nose bleeds, sore throat Cardiac:  No dizziness, chest pain or heaviness, chest tightness,edema Resp:  admits to severe dyspnea Gi: Denies swallowing difficulty, stomach pain, nausea or vomiting, diarrhea, constipation, bowel incontinence Gu:  Denies bladder incontinence, burning urine Ext:   Denies Joint pain, stiffness or swelling Skin: Denies  skin rash, easy bruising or bleeding or hives Endoc:  Denies polyuria, polydipsia , polyphagia or weight change Psych:   admits to axniety  Other:  All other systems negative   VS: BP 129/77 (BP Location: Right Arm)   Pulse 95   Temp 98 F (36.7 C)   Resp (!) 25   Ht 6' (1.829 m)   Wt 67.4 kg   SpO2 96%   BMI 20.14 kg/m      PHYSICAL EXAM    GENERAL:NAD, no fevers, chills, no weakness no fatigue HEAD: Normocephalic, atraumatic.  EYES: Pupils equal, round, reactive to light. Extraocular muscles intact. No scleral icterus.  MOUTH: Moist mucosal membrane. Dentition intact. No abscess noted.  EAR, NOSE, THROAT: Clear  without exudates. No external lesions.  NECK: Supple. No thyromegaly. No nodules. No JVD.  PULMONARY: Bilateral rhonchi improved from previous CARDIOVASCULAR: S1 and S2. Regular rate and rhythm. No murmurs, rubs, or gallops. No edema. Pedal pulses 2+ bilaterally.  GASTROINTESTINAL: Soft, nontender, nondistended. No masses. Positive bowel sounds. No hepatosplenomegaly.  MUSCULOSKELETAL: No swelling, clubbing, or edema. Range of motion full in all extremities.  NEUROLOGIC: Cranial nerves II through XII are intact. No gross focal neurological deficits. Sensation intact. Reflexes intact.  SKIN: No ulceration, lesions, rashes, or cyanosis. Skin warm and dry. Turgor intact.  PSYCHIATRIC: Mood, affect within normal limits. The patient is awake, alert and oriented x 3. Insight, judgment intact.       IMAGING     CT Chest High Resolution  Result Date: 02/07/2021 CLINICAL DATA:  Inpatient. Interstitial lung disease. Worsening dyspnea. Chronic hypoxemia post COVID-19. History of lymphoma treated with Rituxan. EXAM: CT CHEST WITHOUT CONTRAST TECHNIQUE: Multidetector CT imaging of the chest was performed following the standard protocol without intravenous contrast. High resolution imaging of the lungs, as well as inspiratory and expiratory imaging, was performed. COMPARISON:  02/05/2021 chest radiograph. 01/13/2021 chest CT angiogram. FINDINGS: Cardiovascular: Normal heart size. No significant pericardial effusion/thickening. Right internal jugular central venous catheter terminates at the cavoatrial junction. Normal course and caliber of the thoracic aorta. Stable dilated main pulmonary artery (3.7 cm diameter). Mediastinum/Nodes: No discrete thyroid nodules. Unremarkable esophagus. No pathologically enlarged axillary, mediastinal or hilar lymph nodes, noting limited sensitivity for the detection of hilar adenopathy on this noncontrast study. Lungs/Pleura: No pneumothorax. No pleural effusion. Severe patchy confluent peribronchovascular and peripheral ground-glass opacity, reticulation, traction bronchiectasis, architectural distortion and volume loss in both lungs with a basilar predominance. The ground-glass opacities have mildly improved since 01/13/2021 chest CT. The bronchiectasis, distortion and volume loss appear slightly worsened. No significant lobular air trapping or evidence of tracheobronchomalacia on the expiration sequence. No acute consolidative airspace disease, lung masses or significant pulmonary nodules. No frank honeycombing. Upper abdomen: Chronic mild elevation of the right hemidiaphragm. Musculoskeletal: No aggressive appearing focal osseous lesions. Mild thoracic spondylosis. IMPRESSION: 1. Spectrum of findings compatible with severe basilar predominant fibrotic interstitial lung disease with  ground-glass and no frank honeycombing predominance. Ground-glass opacities have slightly improved and bronchiectasis and other findings of fibrosis have slightly worsened since recent 01/13/2021 chest CT angiogram study. Findings are most compatible with a severe rapidly progressive postinflammatory fibrosis or fibrotic NSIP pattern. Findings are suggestive of an alternative diagnosis (not UIP) per consensus guidelines: Diagnosis of Idiopathic Pulmonary Fibrosis: An Official ATS/ERS/JRS/ALAT Clinical Practice Guideline. [place], Iss 5, [phone], Dec 31 2016. 2. Stable dilated main pulmonary artery, suggesting chronic pulmonary arterial hypertension. 3. Chronic mild elevation of the right hemidiaphragm. Electronically Signed   By: Ilona Sorrel M.D.   On: 02/07/2021 17:29   PERIPHERAL VASCULAR CATHETERIZATION  Result Date: 02/09/2021 See surgical note for result.  DG Chest Port 1 View  Result Date: 02/13/2021 CLINICAL DATA:  Atelectasis EXAM: PORTABLE CHEST 1 VIEW COMPARISON:  02/05/2021 FINDINGS: Low lung volumes with similar appearance of interstitial lung disease, left greater than right. No pleural effusion. Right chest wall port has been removed. Stable cardiomediastinal contours. IMPRESSION: Interval removal of chest port. Interstitial lung disease with similar lung aeration to prior study. Electronically Signed   By: Macy Mis M.D.   On: 02/13/2021 13:21   DG Chest Port 1 View  Result Date: 02/05/2021 CLINICAL DATA:  Bilateral pulmonary infiltrates on  chest x-ray. EXAM: PORTABLE CHEST 1 VIEW COMPARISON:  Chest radiograph 02/01/2021 FINDINGS: Power injectable right IJ port central venous catheter tip projects at the level of the superior cavoatrial junction. Unchanged low lung volumes with lower lobe predominant interstitial opacities, left greater than right. No new focal consolidation, pleural effusion, or pneumothorax. Heart is normal in size. Surgical clips in  the right upper abdomen. IMPRESSION: Bilateral interstitial airspace opacities are unchanged compared to 02/01/2021. No new focal consolidation, pleural effusion, or pneumothorax. Electronically Signed   By: Ileana Roup M.D.   On: 02/05/2021 10:59   DG Chest Port 1 View  Result Date: 02/01/2021 CLINICAL DATA:  Shortness of breath, edema EXAM: PORTABLE CHEST 1 VIEW COMPARISON:  01/30/2021 FINDINGS: Stable positioning of right IJ approach Port-A-Cath. Heart size within normal limits. Low lung volumes. Slight interval worsening of bilateral airspace opacities, left worse than right. No pleural effusion or pneumothorax. IMPRESSION: Slight interval worsening of bilateral airspace opacities, left worse than right. Electronically Signed   By: Davina Poke D.O.   On: 02/01/2021 14:37   DG Chest Port 1 View  Result Date: 01/30/2021 CLINICAL DATA:  Acute on chronic hypoxemic respiratory failure. EXAM: PORTABLE CHEST 1 VIEW COMPARISON:  January 28, 2021 FINDINGS: No pneumothorax. Stable cardiomediastinal silhouette and right Port-A-Cath. Left-greater-than-right pulmonary opacities/infiltrates. No other interval changes. IMPRESSION: 1. Persistent low lung volumes and bilateral pulmonary infiltrates. No interval changes. Electronically Signed   By: Dorise Bullion III M.D.   On: 01/30/2021 08:36   DG Chest Port 1 View  Result Date: 01/28/2021 CLINICAL DATA:  Shortness of breath. EXAM: PORTABLE CHEST 1 VIEW COMPARISON:  Multiple recent chest films. FINDINGS: The right IJ power port is stable. Persistent low lung volumes and bilateral pulmonary infiltrates. No definite pleural effusion or pneumothorax. IMPRESSION: Persistent low lung volumes and bilateral pulmonary infiltrates. Electronically Signed   By: Marijo Sanes M.D.   On: 01/28/2021 16:03   DG Chest Port 1 View  Result Date: 01/26/2021 CLINICAL DATA:  Shortness of breath, abnormal chest x-ray EXAM: PORTABLE CHEST 1 VIEW COMPARISON:  01/24/2021  FINDINGS: Right Port-A-Cath remains in place, unchanged. Low lung volumes with bilateral airspace opacities, most pronounced in the left perihilar and lower lobe, unchanged since prior study. No visible effusions or pneumothorax. Heart is normal size. IMPRESSION: Low lung volumes with bilateral airspace disease, left-greater-than-right. No significant change. Electronically Signed   By: Rolm Baptise M.D.   On: 01/26/2021 09:42   DG Chest Port 1 View  Result Date: 01/24/2021 CLINICAL DATA:  Follow-up multifocal pneumonia. EXAM: PORTABLE CHEST 1 VIEW COMPARISON:  01/22/2021 and older exams. FINDINGS: Mild interval decrease in the airspace lung opacities, most evident on the left. Lung volumes remain low. No new lung abnormalities. No visualized pleural effusion.  No pneumothorax. Right internal jugular Port-A-Cath is stable. IMPRESSION: 1. Mild interval improvement in lung aeration with decreased airspace lung opacities, most notably on the left. Electronically Signed   By: Lajean Manes M.D.   On: 01/24/2021 07:57   DG Chest Port 1 View  Result Date: 01/22/2021 CLINICAL DATA:  Hypoxia, shortness of breath. EXAM: PORTABLE CHEST 1 VIEW COMPARISON:  January 20, 2021. FINDINGS: Stable cardiomediastinal silhouette. Right internal jugular Port-A-Cath is unchanged in position. Hypoinflation of the lungs is noted. Stable bilateral lung opacities are noted, left greater than right, most consistent with multifocal pneumonia. Bony thorax is unremarkable. IMPRESSION: Hypoinflation of the lungs. Stable bilateral lung opacities are noted, left greater than right, most consistent with multifocal pneumonia.  Electronically Signed   By: Marijo Conception M.D.   On: 01/22/2021 19:26   DG Chest Port 1 View  Result Date: 01/20/2021 CLINICAL DATA:  Pneumonitis EXAM: PORTABLE CHEST 1 VIEW COMPARISON:  01/19/2021 FINDINGS: Shallow inspiration. Patchy infiltrates in the left lung and right lung base, similar to prior study and  likely multifocal pneumonia. No pleural effusions. No pneumothorax. Mediastinal contours appear intact. Heart size is normal. Power port type central venous catheter on the right with tip over the cavoatrial junction region. Surgical clips in the right upper quadrant. IMPRESSION: Shallow inspiration. Patchy infiltrates in both lungs, likely multifocal pneumonia, without change. Electronically Signed   By: Lucienne Capers M.D.   On: 01/20/2021 03:56   DG Chest Port 1 View  Result Date: 01/19/2021 CLINICAL DATA:  Abnormality on previous chest x-ray in a 60 year old male. EXAM: PORTABLE CHEST 1 VIEW COMPARISON:  CT from January 13, 2021, chest x-ray January 18, 2021. FINDINGS: RIGHT-sided Port-A-Cath terminates at the caval to atrial junction. Trachea is midline. Cardiomediastinal contours are stable. Interstitial and airspace opacities throughout the chest worse in the LEFT mid to upper chest, RIGHT upper chest lung bases bilaterally with similar appearance. On limited assessment there is no acute skeletal process. EKG leads project over the chest. IMPRESSION: No interval change in the appearance of the chest since previous imaging with continued decreased lung volumes and patchy bilateral opacities. Electronically Signed   By: Zetta Bills M.D.   On: 01/19/2021 07:59   DG Chest Port 1 View  Result Date: 01/18/2021 CLINICAL DATA:  Respiratory distress EXAM: PORTABLE CHEST 1 VIEW COMPARISON:  01/17/2021 FINDINGS: Cardiac shadow is stable. Right-sided chest wall port is again seen. Lungs are hypoinflated. Patchy airspace opacities again identified in both lungs left greater than right but stable from the prior exam. IMPRESSION: Patchy airspace opacities bilaterally stable from the prior exam. Electronically Signed   By: Inez Catalina M.D.   On: 01/18/2021 17:24   DG Chest Port 1 View  Result Date: 01/17/2021 CLINICAL DATA:  Worsening shortness of breath. EXAM: PORTABLE CHEST 1 VIEW COMPARISON:   01/14/2021 FINDINGS: 0826 hours. Low volume film. Patchy airspace disease again noted in both lungs, left greater than right. Cardiopericardial silhouette is at upper limits of normal for size. Right Port-A-Cath again noted. Telemetry leads overlie the chest. IMPRESSION: Low volume film with patchy bilateral airspace disease, left greater than right. No substantial change. Electronically Signed   By: Misty Stanley M.D.   On: 01/17/2021 08:53           ASSESSMENT/PLAN   Acute on hypoxemic respiratory failure Due to acute exacerbation of Chronic lung disease/ILD with Critical illness Myopathy associated respiratory failure - present on admission  - COVID19 negative   - supplemental O2 during my evaluation 10l/min>>8L>>HFNC 7L -Respiratory viral panel-negative  -serum fungitell-negative  -legionella ab-negative  -nasal MRSA PCR-negative -Procalcitonin trend reviewed -strep pneumoniae ur AG -Histoplasma Ur Ag-negative -sputum resp cultures -AFB sputum expectorated specimen -sputum cytology  -completed  Cefepime course for cap -Reduced Solumedrol to 40 BID - 01/15/21>>50 BID>>40 BID>>30 BID>>20 BID>>pred 50>>soluemdrol 40 iv bid please watch surgars -reviewed pertinent imaging with patient today - ESR and CRP are elevated but improving -PT/OT for d/c planning  -please encourage patient to use incentive spirometer few times each hour while hospitalized.   -continue Cellcept 250 bid >>500 bid >>750BID -UOP is adequate -Discussed goal of care - recommend DNR patient is in agreement but still discussing with wife, palliative care  service to follow  -lasix 20 daily PO     Thank you for allowing me to participate in the care of this patient.   Patient/Family are satisfied with care plan and all questions have been answered.   This document was prepared using Dragon voice recognition software and may include unintentional dictation errors.     Ottie Glazier, M.D.  Division of  [facility]

## 2021-02-16 NOTE — Progress Notes (Signed)
Patient very dyspneic with exertion.  Requires a nonrebreather in order to recover after exertion.

## 2021-02-16 NOTE — Progress Notes (Signed)
PT Cancellation Note  Patient Details Name: [place] MRN: [id number] DOB: [birth year]   Cancelled Treatment:    Reason Eval/Treat Not Completed: Patient declined, no reason specified. Notified by the nurse that patient declines PT at this time. PT will continue with attempts.   Minna Merritts, PT, MPT  Percell Locus 02/16/2021, 12:01 PM

## 2021-02-16 NOTE — Plan of Care (Addendum)
PMT note:  Patient has been clear he wants full scope of care. He has also been clear he would like full code status sharing that his wife was not comfortable with a DNR status. He has discussed desire to follow with pulmonology outpatient in the ILD clinic at [facility]. It appears his status is improving such that he was able to taper to PO steroids and his O2 needs are decreasing. Concern for overall poor prognosis and QOL. Recommend outpatient palliative to follow. Signing off.

## 2021-02-16 NOTE — Progress Notes (Signed)
Instructed pt & wife how to calculate how long O2 tank will last given liter flow and tank PSI. Calculation quotient given for E and D tank.

## 2021-02-16 NOTE — Progress Notes (Signed)
Date and time results received: 02/16/21 11:30 AM  (use smartphrase ".now" to insert current time)  Test: blood glucose Critical Value: 463  Name of Provider Notified: [email or web address]  Orders Received? Or Actions Taken?: no new orders.  MD acknowledged.

## 2021-02-16 NOTE — Progress Notes (Addendum)
Progress Note    [place]  LYY:[id number] DOB: 11/04/1960  DOA: 01/13/2021 PCP: Rusty Aus, MD      Brief Narrative:    Medical records reviewed and are as summarized below:  [place] is a 60 y.o. 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  He was admitted to the hospital for severe sepsis secondary to committee acquired pneumonia and interstitial lung disease exacerbation complicated by acute on chronic hypoxemic respiratory failure.  He was treated with IV steroids and IV antibiotics.  Unfortunately, tapering of steroids was a challenge because he developed exacerbation of interstitial lung disease while his steroids were tapered down.      Assessment/Plan:   Principal Problem:   Acute on chronic respiratory failure with hypoxia (HCC) Active Problems:   Type 2 diabetes mellitus with hypoglycemia without coma, without long-term current use of insulin (HCC)   Follicular lymphoma (HCC)   SOB (shortness of breath)   Interstitial lung disease (HCC)   Severe sepsis (HCC)   CAP (community acquired pneumonia)   Elevated troponin   Protein calorie malnutrition (HCC)   Protein-calorie malnutrition, severe   AF (paroxysmal atrial fibrillation) (HCC)   Thrombocytosis   Leukocytosis   Pressure injury of skin   Hyponatremia   Uncontrolled type 2 diabetes mellitus with hyperglycemia (HCC)   Nutrition Problem: Severe Malnutrition Etiology: chronic illness (lymphoma, ILD)  Signs/Symptoms: severe fat depletion, severe muscle depletion, percent weight loss Percent weight loss: 16 %   Body mass index is 20.14 kg/m.  Acute on chronic hypoxemic respiratory failure, exacerbation of interstitial lung disease,  bronchiectasis: He is on 4 L/min oxygen via nasal collar.  He is on 3 L/min oxygen at baseline.  He is above IV Solu-Medrol but he remains on oral dexamethasone.  Follow-up with pulmonologist for further recommendations..  Type II DM with severe hyperglycemia, hypoglycemia: He had hypoglycemia (glucose level 46) on the evening of 02/15/2021.  NovoLog was decreased to 5 units (from 12) and insulin glargine was decreased to 10 units (from 20).  Unfortunately, patient became hyperglycemic again with glucose level of 463.  Increase insulin glargine to 15 units twice daily and NovoLog 10 units 3 times daily.  Monitor glucose levels closely and adjust insulin therapy accordingly.  Paroxysmal atrial fibrillation: Continue Cardizem and Eliquis  Follicular lymphoma: Completed chemotherapy.  S/p removal of Port-A-Cath on 02/09/2021.  Immunocompromised state: He is on Bactrim for PCP prophylaxis   Diet Order             Diet Carb Modified Fluid consistency: Thin; Room service appropriate? Yes  Diet effective now                      Consultants: Pulmonologist Palliative care  Procedures: S/p removal of Port-A-Cath on 02/09/2021    Medications:    ALPRAZolam  0.25 mg Oral q morning   apixaban  5 mg Oral BID   dexamethasone  8 mg Oral Q12H   digoxin  0.125 mg Oral Daily   diltiazem  120 mg Oral Daily   docusate sodium  200 mg Oral BID   feeding supplement (NEPRO CARB STEADY)  237 mL Oral TID BM   guaiFENesin  1,200 mg Oral BID   insulin aspart  0-15  Units Subcutaneous TID WC   insulin aspart  0-5 Units Subcutaneous QHS   insulin aspart  5 Units Subcutaneous TID WC   insulin glargine-yfgn  10 Units Subcutaneous BID   ipratropium-albuterol  3 mL Nebulization TID   multivitamin with minerals  1 tablet Oral Daily   mycophenolate  750 mg Oral BID   polyethylene glycol  17 g Oral Daily   sulfamethoxazole-trimethoprim  1 tablet Oral Q12H   Continuous Infusions:   Anti-infectives  (From admission, onward)    Start     Dose/Rate Route Frequency Ordered Stop   01/31/21 1000  azithromycin (ZITHROMAX) tablet 250 mg  Status:  Discontinued       See Hyperspace for full Linked Orders Report.   250 mg Oral Daily 01/30/21 1532 01/31/21 1218   01/30/21 1630  azithromycin (ZITHROMAX) tablet 500 mg       See Hyperspace for full Linked Orders Report.   500 mg Oral Daily 01/30/21 1532 01/30/21 1617   01/30/21 1630  piperacillin-tazobactam (ZOSYN) IVPB 3.375 g  Status:  Discontinued        3.375 g 12.5 mL/hr over 240 Minutes Intravenous Every 8 hours 01/30/21 1540 01/31/21 1218   01/23/21 2200  sulfamethoxazole-trimethoprim (BACTRIM) 400-80 MG per tablet 1 tablet        1 tablet Oral Every 12 hours 01/23/21 1730     01/19/21 2000  sulfamethoxazole-trimethoprim (BACTRIM) 400-80 MG per tablet 1 tablet  Status:  Discontinued        1 tablet Oral Daily 01/19/21 1759 01/23/21 1730   01/15/21 2200  acyclovir (ZOVIRAX) tablet 400 mg  Status:  Discontinued        400 mg Oral 2 times daily 01/15/21 1558 01/15/21 1559   01/15/21 1645  acyclovir (ZOVIRAX) 200 MG capsule 400 mg  Status:  Discontinued        400 mg Oral 2 times daily 01/15/21 1558 02/02/21 1344   01/15/21 1515  acyclovir (ZOVIRAX) tablet 400 mg  Status:  Discontinued        400 mg Oral 2 times daily 01/15/21 1424 01/15/21 1556   01/14/21 1900  azithromycin (ZITHROMAX) 500 mg in sodium chloride 0.9 % 250 mL IVPB        500 mg 250 mL/hr over 60 Minutes Intravenous Every 24 hours 01/13/21 1835 01/20/21 2359   01/14/21 1900  cefTRIAXone (ROCEPHIN) 2 g in sodium chloride 0.9 % 100 mL IVPB  Status:  Discontinued        2 g 200 mL/hr over 30 Minutes Intravenous Every 24 hours 01/13/21 1835 01/13/21 2251   01/13/21 2345  ceFEPIme (MAXIPIME) 2 g in sodium chloride 0.9 % 100 mL IVPB  Status:  Discontinued        2 g 200 mL/hr over 30 Minutes Intravenous Every 8 hours 01/13/21 2309 01/19/21 2035   01/13/21 1800  azithromycin  (ZITHROMAX) 500 mg in sodium chloride 0.9 % 250 mL IVPB        500 mg 250 mL/hr over 60 Minutes Intravenous  Once 01/13/21 1746 01/13/21 1958   01/13/21 1800  cefTRIAXone (ROCEPHIN) 1 g in sodium chloride 0.9 % 100 mL IVPB        1 g 200 mL/hr over 30 Minutes Intravenous  Once 01/13/21 1746 01/13/21 2054              Family Communication/Anticipated D/C date and plan/Code Status   DVT prophylaxis: SCDs Start: 01/13/21 1831 apixaban (ELIQUIS) tablet 5 mg  Code Status: Full Code  Family Communication: None Disposition Plan:    Status is: Inpatient  Remains inpatient appropriate because:Inpatient level of care appropriate due to severity of illness  Dispo: The patient is from: Home              Anticipated d/c is to: Home              Patient currently is not medically stable to d/c.   Difficult to place patient No           Subjective:    Interval events noted.  He still feels very short of breath with minimal exertion.  He was hypoglycemic yesterday evening with glucose level of 46.  Objective:    Vitals:   02/16/21 1102 02/16/21 1355 02/16/21 1454 02/16/21 1516  BP: 132/89   (!) 142/83  Pulse: 98 (!) 107  100  Resp: 17 (!) _0 Temp: 97.9 F (36.6 C)     TempSrc:      SpO2: 91% (!) 88% 98% 92%  Weight:      Height:       No data found.   Intake/Output Summary (Last 24 hours) at 02/16/2021 1620 Last data filed at 02/16/2021 1454 Gross per 24 hour  Intake 840 ml  Output 1300 ml  Net -460 ml   Filed Weights   02/14/21 0500 02/15/21 0551 02/16/21 0700  Weight: 68.1 kg 68.8 kg 67.4 kg    Exam:  GEN: NAD SKIN: Stage II sacral decubitus ulcer EYES: EOMI ENT: MMM CV: RRR PULM: CTA B ABD: soft, ND, NT, +BS CNS: AAO x 3, non focal EXT: No edema or tenderness      Data Reviewed:   I have personally reviewed following labs and imaging studies:  Labs: Labs show the following:   Basic Metabolic Panel: Recent Labs  Lab  02/10/21 0534  NA 133*  K 4.5  CL 93*  CO2 32  GLUCOSE 384*  BUN 21  CREATININE 0.63  CALCIUM 8.7*  MG 2.0   GFR Estimated Creatinine Clearance: 91.3 mL/min (by C-G formula based on SCr of 0.63 mg/dL). Liver Function Tests: No results for input(s): AST, ALT, ALKPHOS, BILITOT, PROT, ALBUMIN in the last 168 hours. No results for input(s): LIPASE, AMYLASE in the last 168 hours. No results for input(s): AMMONIA in the last 168 hours. Coagulation profile No results for input(s): INR, PROTIME in the last 168 hours.  CBC: Recent Labs  Lab 02/10/21 0534 02/13/21 0623  WBC 7.1 5.8  HGB 10.1* 11.5*  HCT 29.7* 33.3*  MCV 86.6 86.3  PLT 186 238   Cardiac Enzymes: No results for input(s): CKTOTAL, CKMB, CKMBINDEX, TROPONINI in the last 168 hours. BNP (last 3 results) No results for input(s): PROBNP in the last 8760 hours. CBG: Recent Labs  Lab 02/15/21 2053 02/15/21 2212 02/16/21 0735 02/16/21 1103 02/16/21 1104  GLUCAP 46* 141* 199* 448* 463*   D-Dimer: No results for input(s): DDIMER in the last 72 hours. Hgb A1c: No results for input(s): HGBA1C in the last 72 hours. Lipid Profile: No results for input(s): CHOL, HDL, LDLCALC, TRIG, CHOLHDL, LDLDIRECT in the last 72 hours. Thyroid function studies: No results for input(s): TSH, T4TOTAL, T3FREE, THYROIDAB in the last 72 hours.  Invalid input(s): FREET3 Anemia work up: No results for input(s): VITAMINB12, FOLATE, FERRITIN, TIBC, IRON, RETICCTPCT in the last 72 hours. Sepsis Labs: Recent Labs  Lab 02/10/21 0534 02/13/21 0623  WBC 7.1 5.8  Microbiology Recent Results (from the past 240 hour(s))  Culture, BAL-quantitative w Gram Stain     Status: None   Collection Time: 02/08/21  7:50 AM   Specimen: SPU; Respiratory  Result Value Ref Range Status   Specimen Description   Final    SPUTUM Performed at [facility], [address], [place], [place] [address]    Special Requests   Final     NONE Performed at [facility], [address] [place], [place] [address]    Gram Stain   Final    NO SQUAMOUS EPITHELIAL CELLS SEEN NO WBC SEEN FEW GRAM POSITIVE RODS FEW GRAM POSITIVE COCCI    Culture   Final    FEW Normal respiratory flora-no Staph aureus or Pseudomonas seen Performed at [facility], [place] [address], [place], [place] [address]    Report Status 02/10/2021 FINAL  Final    Procedures and diagnostic studies:  No results found.             LOS: 34 days   [employer], advertising on [email or web address]. If 7PM-7AM, please contact night-coverage at [email or web address]     02/16/2021, 4:20 PM

## 2021-02-17 LAB — C-REACTIVE PROTEIN
CRP: 0.5 mg/dL (ref <1.0)
CRP: 0.6 mg/dL (ref <1.0)

## 2021-02-17 LAB — GLUCOSE, CAPILLARY
Glucose-Capillary: 124 mg/dL — ABNORMAL HIGH (ref 70–99)
Glucose-Capillary: 211 mg/dL — ABNORMAL HIGH (ref 70–99)
Glucose-Capillary: 103 mg/dL — ABNORMAL HIGH (ref 70–99)
Glucose-Capillary: 240 mg/dL — ABNORMAL HIGH (ref 70–99)

## 2021-02-17 LAB — SEDIMENTATION RATE: Sed Rate: 12 mm/hr (ref 0–20)

## 2021-02-17 MED ORDER — GLIMEPIRIDE 2 MG PO TABS
2.0000 mg | ORAL_TABLET | Freq: Every morning | ORAL | Status: DC
  Administered 2021-02-17 – 2021-02-19 (×3): 2 mg via ORAL
  Filled 2021-02-17 (×3): qty 1

## 2021-02-17 NOTE — Progress Notes (Signed)
Physical Therapy Treatment Patient Details Name: Edward [place] MRN: [id number] DOB: 23-Mar-1961 Today's Date: 02/17/2021   History of Present Illness Pt is a 60 y.o. male with medical history significant for chronic hypoxic respiratory failure on 2 L continuous nasal cannula in the setting of interstitial lung disease, type 2 diabetes mellitus, pulmonary embolism in 2020 now chronically anticoagulated on Eliquis, who arrived to ED with shortness of breath. MD assessment includes:  acute on chronic hypoxic respiratory failure with hypoxia, possible sepsis due to bilateral pneumonia, rapid atrial fibrillation, anemia, and mild elevation of troponin with suspected demand ischemia.    PT Comments    Patient received in bed, had visitor in room. Agreeable to PT session. Patient is mod independent with bed mobility and transfers. Supervision for ambulation to recliner. Requires assistance for O2 set up during mobility. Patient limited by respiratory status. He will continue to benefit from skilled PT while here to improve activity tolerance and strength.      Recommendations for follow up therapy are one component of a multi-disciplinary discharge planning process, led by the attending physician.  Recommendations may be updated based on patient status, additional functional criteria and insurance authorization.  Follow Up Recommendations  Home health PT;Supervision for mobility/OOB     Equipment Recommendations  Rolling walker with 5" wheels;3in1 (PT)    Recommendations for Other Services       Precautions / Restrictions Precautions Precaution Comments: mod fall, monitor O2 throughout session Restrictions Weight Bearing Restrictions: No     Mobility  Bed Mobility Overal bed mobility: Modified Independent             General bed mobility comments: supine<>sit with mod I (HOB elevated, bed rails)    Transfers Overall transfer level: Needs assistance Equipment used: Rolling  walker (2 wheeled) Transfers: Sit to/from Stand Sit to Stand: Supervision            Ambulation/Gait Ambulation/Gait assistance: Supervision Gait Distance (Feet): 3 Feet Assistive device: Rolling walker (2 wheeled) Gait Pattern/deviations: Step-to pattern;Decreased step length - right;Decreased step length - left     General Gait Details: Patient able to get to recliner from bed with [place] and non rebreather set at 15 liters, supervision. O2 sats drop to high 70%s with minimal activity. increased time needed to recover. Patient has good awareness of ability/limitations. Did require cues to slow breathing   Stairs             Wheelchair Mobility    Modified Rankin (Stroke Patients Only)       Balance Overall balance assessment: Modified Independent Sitting-balance support: Feet supported Sitting balance-Leahy Scale: Normal     Standing balance support: Bilateral upper extremity supported;During functional activity Standing balance-Leahy Scale: Good Standing balance comment: BUE support on RW,supervision                            Cognition Arousal/Alertness: Awake/alert Behavior During Therapy: WFL for tasks assessed/performed Overall Cognitive Status: Within Functional Limits for tasks assessed                                 General Comments: Very pleasant & agreeable to tx.      Exercises Other Exercises Other Exercises: Attempted LAQ in stting on [place] only, sats dropped < 80%. Waited to recover and performed on other side with non rebreather also on and sats remained >  92% x 10 reps bilaterally    General Comments        Pertinent Vitals/Pain Pain Assessment: No/denies pain    Home Living                      Prior Function            PT Goals (current goals can now be found in the care plan section) Acute Rehab PT Goals Patient Stated Goal: walk again without being so SOB PT Goal Formulation: With patient Time  For Goal Achievement: 03/02/21 Potential to Achieve Goals: Fair Progress towards PT goals: Progressing toward goals    Frequency    Min 2X/week      PT Plan Current plan remains appropriate    Co-evaluation              AM-PAC PT "6 Clicks" Mobility   Outcome Measure  Help needed turning from your back to your side while in a flat bed without using bedrails?: None Help needed moving from lying on your back to sitting on the side of a flat bed without using bedrails?: None Help needed moving to and from a bed to a chair (including a wheelchair)?: A Little Help needed standing up from a chair using your arms (e.g., wheelchair or bedside chair)?: A Little Help needed to walk in hospital room?: A Lot Help needed climbing 3-5 steps with a railing? : A Lot 6 Click Score: 18    End of Session Equipment Utilized During Treatment: Oxygen Activity Tolerance: Patient limited by fatigue;Treatment limited secondary to medical complications (Comment);Other (comment) (decreased o2 saturations with mobility) Patient left: in chair;with call bell/phone within reach Nurse Communication: Mobility status PT Visit Diagnosis: Muscle weakness (generalized) (M62.81);Difficulty in walking, not elsewhere classified (R26.2)     Time: 6712-4580 PT Time Calculation (min) (ACUTE ONLY): 29 min  Charges:  $Therapeutic Activity: 23-37 mins                     Boomer Winders, PT, GCS 02/17/21,4:11 PM

## 2021-02-17 NOTE — Progress Notes (Signed)
Pulmonary Medicine          Date: 02/17/2021,   MRN# [id number] Edward Atkinson 03-27-1961     AdmissionWeight: 63.5 kg                 CurrentWeight: 67.9 kg   Referring physician: Dr Roderic Palau   CHIEF COMPLAINT:   Acute on chronic hypoxemic respiratory failure   HISTORY OF PRESENT ILLNESS   60 yo M w/hx of chronic hypoxemia and ILD post COVID19, T2DM, PE on eliquis, Lymphoma s/p Rituxan which is completed.  Came in due to worsening SOB and DOE specifically mild exertional defacation/urination with feelings of presyncope and severe dyspnea which started to get worse post tapering from steroid appx few days prior to onset of symptom progression.  He was placed on solumedrol and reported mild improvement.  He is on 10L /min [place] during my evaluation.  He had CT chest done with PE protocol and noted to have bilateral GGO infiltrates worse compared to June study. PCCM consultation for further evaluation and management.   02/16/21- goal to dc Friday to home with PT. Reducing dexamethasone tommorow  02/17/21- patietn remains on 4L/min [place] he is slowly improving and plan to go homwith NIV ventilator and Afflo vest therapy   PAST MEDICAL HISTORY   Past Medical History:  Diagnosis Date  . Chronic respiratory failure with hypoxia (HCC)    2L [place] continuous  . COVID-19   . Diabetes mellitus without complication ([place])   . Diverticulitis   . Follicular lymphoma of intra-abdominal lymph nodes ([place]) 02/15/2019  . ILD (interstitial lung disease) ([place])      SURGICAL HISTORY   Past Surgical History:  Procedure Laterality Date  . CHOLECYSTECTOMY    . COLON RESECTION     DUE TO DIVERTICULITIS  . FLEXIBLE BRONCHOSCOPY N/A 12/02/2019   Procedure: FLEXIBLE BRONCHOSCOPY;  Surgeon: Ottie Glazier, MD;  Location: [facility] ORS;  Service: Thoracic;  Laterality: N/A;  . PORTA CATH INSERTION N/A 03/11/2019   Procedure: PORTA CATH INSERTION;  Surgeon: Algernon Huxley, MD;  Location: [place] [facility];  Service: Cardiovascular;  Laterality: N/A;  . PORTA CATH REMOVAL Right 02/09/2021   Procedure: PORTA CATH REMOVAL;  Surgeon: Katha Cabal, MD;  Location: [place] [facility];  Service: Cardiovascular;  Laterality: Right;  . PULMONARY THROMBECTOMY N/A 01/29/2019   Procedure: PULMONARY THROMBECTOMY;  Surgeon: Algernon Huxley, MD;  Location: [place] [facility];  Service: Cardiovascular;  Laterality: N/A;     FAMILY HISTORY   Family History  Problem Relation Age of Onset  . Diabetes Brother      SOCIAL HISTORY   Social History   Tobacco Use  . Smoking status: Never  . Smokeless tobacco: Never  Vaping Use  . Vaping Use: Never used  Substance Use Topics  . Alcohol use: Yes    Alcohol/week: 0.0 - 2.0 standard drinks    Comment: occasional  . Drug use: Never     MEDICATIONS    Home Medication:    Current Medication:  Current Facility-Administered Medications:  .  acetaminophen (TYLENOL) tablet 650 mg, 650 mg, Oral, Q6H PRN, 650 mg at 02/16/21 0726 **OR** acetaminophen (TYLENOL) suppository 650 mg, 650 mg, Rectal, Q6H PRN, Oswald Hillock, RPH .  ALPRAZolam Duanne Moron) tablet 0.25 mg, 0.25 mg, Oral, QHS PRN, Sharen Hones, MD, 0.25 mg at 02/13/21 2148 .  ALPRAZolam (XANAX) tablet 0.25 mg, 0.25 mg, Oral, q morning, Sharen Hones, MD, 0.25 mg  at 02/17/21 0858 .  apixaban (ELIQUIS) tablet 5 mg, 5 mg, Oral, BID, Beers, Shanon Brow, RPH, 5 mg at 02/17/21 0852 .  dexamethasone (DECADRON) tablet 8 mg, 8 mg, Oral, Q12H, Ottie Glazier, MD, 8 mg at 02/17/21 0851 .  digoxin (LANOXIN) tablet 0.125 mg, 0.125 mg, Oral, Daily, Memon, Jolaine Artist, MD, 0.125 mg at 02/17/21 0851 .  diltiazem (CARDIZEM CD) 24 hr capsule 120 mg, 120 mg, Oral, Daily, Vivaan Helseth, MD, 120 mg at 02/17/21 0853 .  diltiazem (CARDIZEM) injection 10 mg, 10 mg, Intravenous, Q6H PRN, Wyvonnia Dusky, MD, 10 mg at 01/24/21 2328 .  docusate sodium (COLACE) capsule 200 mg, 200 mg, Oral, BID, Wyvonnia Dusky,  MD, 200 mg at 02/17/21 7622 .  feeding supplement (NEPRO CARB STEADY) liquid 237 mL, 237 mL, Oral, TID BM, Memon, Jehanzeb, MD, Last Rate: 0 mL/hr at 01/21/21 2130, 237 mL at 02/17/21 1023 .  glimepiride (AMARYL) tablet 2 mg, 2 mg, Oral, q morning, Fritzi Mandes, MD, 2 mg at 02/17/21 1022 .  guaiFENesin (MUCINEX) 12 hr tablet 1,200 mg, 1,200 mg, Oral, BID, Kathie Dike, MD, 1,200 mg at 02/17/21 0852 .  guaiFENesin-dextromethorphan (ROBITUSSIN DM) 100-10 MG/5ML syrup 5 mL, 5 mL, Oral, Q4H PRN, Kathie Dike, MD, 5 mL at 02/04/21 0850 .  HYDROmorphone (DILAUDID) injection 1 mg, 1 mg, Intravenous, Once PRN, Stegmayer, Kimberly A, PA-C .  insulin aspart (novoLOG) injection 0-15 Units, 0-15 Units, Subcutaneous, TID WC, Sharen Hones, MD, 2 Units at 02/17/21 0849 .  insulin aspart (novoLOG) injection 0-5 Units, 0-5 Units, Subcutaneous, QHS, Sharen Hones, MD, 3 Units at 02/13/21 2158 .  insulin aspart (novoLOG) injection 10 Units, 10 Units, Subcutaneous, TID WC, Jennye Boroughs, MD, 10 Units at 02/17/21 0850 .  insulin glargine-yfgn (SEMGLEE) injection 15 Units, 15 Units, Subcutaneous, BID, Jennye Boroughs, MD, 15 Units at 02/17/21 0851 .  ipratropium-albuterol (DUONEB) 0.5-2.5 (3) MG/3ML nebulizer solution 3 mL, 3 mL, Nebulization, TID, Jennye Boroughs, MD, 3 mL at 02/17/21 0829 .  lactulose (CHRONULAC) 10 GM/15ML solution 30 g, 30 g, Oral, BID PRN, Leslye Peer, Richard, MD .  levalbuterol [facility]) nebulizer solution 1.25 mg, 1.25 mg, Nebulization, Q4H PRN, Howerter, Justin B, DO, 1.25 mg at 01/17/21 2011 .  morphine 2 MG/ML injection 1 mg, 1 mg, Intravenous, Q3H PRN, Ottie Glazier, MD, 1 mg at 02/15/21 1358 .  multivitamin with minerals tablet 1 tablet, 1 tablet, Oral, Daily, Kathie Dike, MD, 1 tablet at 02/17/21 0851 .  mycophenolate (CELLCEPT) capsule 750 mg, 750 mg, Oral, BID, Lanney Gins, Sophiea Ueda, MD, 750 mg at 02/17/21 0852 .  ondansetron (ZOFRAN) injection 4 mg, 4 mg, Intravenous, Q6H PRN, Stegmayer,  Kimberly A, PA-C .  polyethylene glycol (MIRALAX / GLYCOLAX) packet 17 g, 17 g, Oral, Daily, Leslye Peer, Richard, MD, 17 g at 02/17/21 6333 .  simethicone (MYLICON) chewable tablet 80 mg, 80 mg, Oral, QID PRN, Loletha Grayer, MD, 80 mg at 01/29/21 1411 .  sodium chloride (OCEAN) 0.65 % nasal spray 1 spray, 1 spray, Each Nare, PRN, Loletha Grayer, MD, 1 spray at 01/30/21 2142 .  sulfamethoxazole-trimethoprim (BACTRIM) 400-80 MG per tablet 1 tablet, 1 tablet, Oral, Q12H, Ottie Glazier, MD, 1 tablet at 02/17/21 0851 .  traZODone (DESYREL) tablet 50 mg, 50 mg, Oral, QHS PRN, Sharion Settler, NP, 50 mg at 02/16/21 2159    ALLERGIES   Penicillins     REVIEW OF SYSTEMS    Review of Systems:  Gen:  Denies  fever, sweats, chills weigh loss  HEENT: Denies blurred  vision, double vision, ear pain, eye pain, hearing loss, nose bleeds, sore throat Cardiac:  No dizziness, chest pain or heaviness, chest tightness,edema Resp:  admits to severe dyspnea Gi: Denies swallowing difficulty, stomach pain, nausea or vomiting, diarrhea, constipation, bowel incontinence Gu:  Denies bladder incontinence, burning urine Ext:   Denies Joint pain, stiffness or swelling Skin: Denies  skin rash, easy bruising or bleeding or hives Endoc:  Denies polyuria, polydipsia , polyphagia or weight change Psych:   admits to axniety  Other:  All other systems negative   VS: BP 120/81 (BP Location: Right Arm)   Pulse 91   Temp 97.9 F (36.6 C) (Oral)   Resp 20   Ht 6' (1.829 m)   Wt 67.9 kg   SpO2 94%   BMI 20.30 kg/m      PHYSICAL EXAM    GENERAL:NAD, no fevers, chills, no weakness no fatigue HEAD: Normocephalic, atraumatic.  EYES: Pupils equal, round, reactive to light. Extraocular muscles intact. No scleral icterus.  MOUTH: Moist mucosal membrane. Dentition intact. No abscess noted.  EAR, NOSE, THROAT: Clear without exudates. No external lesions.  NECK: Supple. No thyromegaly. No nodules. No JVD.   PULMONARY: Bilateral rhonchi improved from previous CARDIOVASCULAR: S1 and S2. Regular rate and rhythm. No murmurs, rubs, or gallops. No edema. Pedal pulses 2+ bilaterally.  GASTROINTESTINAL: Soft, nontender, nondistended. No masses. Positive bowel sounds. No hepatosplenomegaly.  MUSCULOSKELETAL: No swelling, clubbing, or edema. Range of motion full in all extremities.  NEUROLOGIC: Cranial nerves II through XII are intact. No gross focal neurological deficits. Sensation intact. Reflexes intact.  SKIN: No ulceration, lesions, rashes, or cyanosis. Skin warm and dry. Turgor intact.  PSYCHIATRIC: Mood, affect within normal limits. The patient is awake, alert and oriented x 3. Insight, judgment intact.       IMAGING    CT Chest High Resolution  Result Date: 02/07/2021 CLINICAL DATA:  Inpatient. Interstitial lung disease. Worsening dyspnea. Chronic hypoxemia post COVID-19. History of lymphoma treated with Rituxan. EXAM: CT CHEST WITHOUT CONTRAST TECHNIQUE: Multidetector CT imaging of the chest was performed following the standard protocol without intravenous contrast. High resolution imaging of the lungs, as well as inspiratory and expiratory imaging, was performed. COMPARISON:  02/05/2021 chest radiograph. 01/13/2021 chest CT angiogram. FINDINGS: Cardiovascular: Normal heart size. No significant pericardial effusion/thickening. Right internal jugular central venous catheter terminates at the cavoatrial junction. Normal course and caliber of the thoracic aorta. Stable dilated main pulmonary artery (3.7 cm diameter). Mediastinum/Nodes: No discrete thyroid nodules. Unremarkable esophagus. No pathologically enlarged axillary, mediastinal or hilar lymph nodes, noting limited sensitivity for the detection of hilar adenopathy on this noncontrast study. Lungs/Pleura: No pneumothorax. No pleural effusion. Severe patchy confluent peribronchovascular and peripheral ground-glass opacity, reticulation, traction  bronchiectasis, architectural distortion and volume loss in both lungs with a basilar predominance. The ground-glass opacities have mildly improved since 01/13/2021 chest CT. The bronchiectasis, distortion and volume loss appear slightly worsened. No significant lobular air trapping or evidence of tracheobronchomalacia on the expiration sequence. No acute consolidative airspace disease, lung masses or significant pulmonary nodules. No frank honeycombing. Upper abdomen: Chronic mild elevation of the right hemidiaphragm. Musculoskeletal: No aggressive appearing focal osseous lesions. Mild thoracic spondylosis. IMPRESSION: 1. Spectrum of findings compatible with severe basilar predominant fibrotic interstitial lung disease with ground-glass and no frank honeycombing predominance. Ground-glass opacities have slightly improved and bronchiectasis and other findings of fibrosis have slightly worsened since recent 01/13/2021 chest CT angiogram study. 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 Findings are suggestive of an alternative diagnosis (not UIP) per consensus guidelines: Diagnosis of Idiopathic Pulmonary Fibrosis: An Official ATS/ERS/JRS/ALAT Clinical Practice Guideline. [place], Iss 5, [phone], Dec 31 2016. 2. Stable dilated main pulmonary artery, suggesting chronic pulmonary arterial hypertension. 3. Chronic mild elevation of the right hemidiaphragm. Electronically Signed   By: Ilona Sorrel M.D.   On: 02/07/2021 17:29   PERIPHERAL VASCULAR CATHETERIZATION  Result Date: 02/09/2021 See surgical note for result.  DG Chest Port 1 View  Result Date: 02/13/2021 CLINICAL DATA:  Atelectasis EXAM: PORTABLE CHEST 1 VIEW COMPARISON:  02/05/2021 FINDINGS: Low lung volumes with similar appearance of interstitial lung disease, left greater than right. No pleural effusion. Right chest wall port has been removed. Stable  cardiomediastinal contours. IMPRESSION: Interval removal of chest port. Interstitial lung disease with similar lung aeration to prior study. Electronically Signed   By: Macy Mis M.D.   On: 02/13/2021 13:21   DG Chest Port 1 View  Result Date: 02/05/2021 CLINICAL DATA:  Bilateral pulmonary infiltrates on chest x-ray. EXAM: PORTABLE CHEST 1 VIEW COMPARISON:  Chest radiograph 02/01/2021 FINDINGS: Power injectable right IJ port central venous catheter tip projects at the level of the superior cavoatrial junction. Unchanged low lung volumes with lower lobe predominant interstitial opacities, left greater than right. No new focal consolidation, pleural effusion, or pneumothorax. Heart is normal in size. Surgical clips in the right upper abdomen. IMPRESSION: Bilateral interstitial airspace opacities are unchanged compared to 02/01/2021. No new focal consolidation, pleural effusion, or pneumothorax. Electronically Signed   By: Ileana Roup M.D.   On: 02/05/2021 10:59   DG Chest Port 1 View  Result Date: 02/01/2021 CLINICAL DATA:  Shortness of breath, edema EXAM: PORTABLE CHEST 1 VIEW COMPARISON:  01/30/2021 FINDINGS: Stable positioning of right IJ approach Port-A-Cath. Heart size within normal limits. Low lung volumes. Slight interval worsening of bilateral airspace opacities, left worse than right. No pleural effusion or pneumothorax. IMPRESSION: Slight interval worsening of bilateral airspace opacities, left worse than right. Electronically Signed   By: Davina Poke D.O.   On: 02/01/2021 14:37   DG Chest Port 1 View  Result Date: 01/30/2021 CLINICAL DATA:  Acute on chronic hypoxemic respiratory failure. EXAM: PORTABLE CHEST 1 VIEW COMPARISON:  January 28, 2021 FINDINGS: No pneumothorax. Stable cardiomediastinal silhouette and right Port-A-Cath. Left-greater-than-right pulmonary opacities/infiltrates. No other interval changes. IMPRESSION: 1. Persistent low lung volumes and bilateral pulmonary  infiltrates. No interval changes. Electronically Signed   By: Dorise Bullion III M.D.   On: 01/30/2021 08:36   DG Chest Port 1 View  Result Date: 01/28/2021 CLINICAL DATA:  Shortness of breath. EXAM: PORTABLE CHEST 1 VIEW COMPARISON:  Multiple recent chest films. FINDINGS: The right IJ power port is stable. Persistent low lung volumes and bilateral pulmonary infiltrates. No definite pleural effusion or pneumothorax. IMPRESSION: Persistent low lung volumes and bilateral pulmonary infiltrates. Electronically Signed   By: Marijo Sanes M.D.   On: 01/28/2021 16:03   DG Chest Port 1 View  Result Date: 01/26/2021 CLINICAL DATA:  Shortness of breath, abnormal chest x-ray EXAM: PORTABLE CHEST 1 VIEW COMPARISON:  01/24/2021 FINDINGS: Right Port-A-Cath remains in place, unchanged. Low lung volumes with bilateral airspace opacities, most pronounced in the left perihilar and lower lobe, unchanged since prior study. No visible effusions or pneumothorax. Heart is normal size. IMPRESSION: Low lung volumes with bilateral airspace disease, left-greater-than-right. No significant change. Electronically Signed   By: Lennette Bihari  [place] M.D.   On: 01/26/2021 09:42   DG Chest Port 1 View  Result Date: 01/24/2021 CLINICAL DATA:  Follow-up multifocal pneumonia. EXAM: PORTABLE CHEST 1 VIEW COMPARISON:  01/22/2021 and older exams. FINDINGS: Mild interval decrease in the airspace lung opacities, most evident on the left. Lung volumes remain low. No new lung abnormalities. No visualized pleural effusion.  No pneumothorax. Right internal jugular Port-A-Cath is stable. IMPRESSION: 1. Mild interval improvement in lung aeration with decreased airspace lung opacities, most notably on the left. Electronically Signed   By: Lajean Manes M.D.   On: 01/24/2021 07:57   DG Chest Port 1 View  Result Date: 01/22/2021 CLINICAL DATA:  Hypoxia, shortness of breath. EXAM: PORTABLE CHEST 1 VIEW COMPARISON:  January 20, 2021. FINDINGS: Stable  cardiomediastinal silhouette. Right internal jugular Port-A-Cath is unchanged in position. Hypoinflation of the lungs is noted. Stable bilateral lung opacities are noted, left greater than right, most consistent with multifocal pneumonia. Bony thorax is unremarkable. IMPRESSION: Hypoinflation of the lungs. Stable bilateral lung opacities are noted, left greater than right, most consistent with multifocal pneumonia. Electronically Signed   By: Marijo Conception M.D.   On: 01/22/2021 19:26   DG Chest Port 1 View  Result Date: 01/20/2021 CLINICAL DATA:  Pneumonitis EXAM: PORTABLE CHEST 1 VIEW COMPARISON:  01/19/2021 FINDINGS: Shallow inspiration. Patchy infiltrates in the left lung and right lung base, similar to prior study and likely multifocal pneumonia. No pleural effusions. No pneumothorax. Mediastinal contours appear intact. Heart size is normal. Power port type central venous catheter on the right with tip over the cavoatrial junction region. Surgical clips in the right upper quadrant. IMPRESSION: Shallow inspiration. Patchy infiltrates in both lungs, likely multifocal pneumonia, without change. Electronically Signed   By: Lucienne Capers M.D.   On: 01/20/2021 03:56   DG Chest Port 1 View  Result Date: 01/19/2021 CLINICAL DATA:  Abnormality on previous chest x-ray in a 60 year old male. EXAM: PORTABLE CHEST 1 VIEW COMPARISON:  CT from January 13, 2021, chest x-ray January 18, 2021. FINDINGS: RIGHT-sided Port-A-Cath terminates at the caval to atrial junction. Trachea is midline. Cardiomediastinal contours are stable. Interstitial and airspace opacities throughout the chest worse in the LEFT mid to upper chest, RIGHT upper chest lung bases bilaterally with similar appearance. On limited assessment there is no acute skeletal process. EKG leads project over the chest. IMPRESSION: No interval change in the appearance of the chest since previous imaging with continued decreased lung volumes and patchy  bilateral opacities. Electronically Signed   By: Zetta Bills M.D.   On: 01/19/2021 07:59   DG Chest Port 1 View  Result Date: 01/18/2021 CLINICAL DATA:  Respiratory distress EXAM: PORTABLE CHEST 1 VIEW COMPARISON:  01/17/2021 FINDINGS: Cardiac shadow is stable. Right-sided chest wall port is again seen. Lungs are hypoinflated. Patchy airspace opacities again identified in both lungs left greater than right but stable from the prior exam. IMPRESSION: Patchy airspace opacities bilaterally stable from the prior exam. Electronically Signed   By: Inez Catalina M.D.   On: 01/18/2021 17:24           ASSESSMENT/PLAN   Acute on hypoxemic respiratory failure Due to acute exacerbation of Chronic lung disease/ILD with Critical illness Myopathy associated respiratory failure - present on admission  - COVID19 negative   - supplemental O2 during my evaluation 10l/min>>8L>>HFNC 7L -Respiratory viral panel-negative  -serum fungitell-negative  -legionella ab-negative  -nasal MRSA PCR-negative -Procalcitonin trend reviewed -strep pneumoniae ur AG -Histoplasma Ur Ag-negative -sputum resp  cultures -AFB sputum expectorated specimen -sputum cytology  -completed  Cefepime course for cap -Reduced Solumedrol to 40 BID - 01/15/21>>50 BID>>40 BID>>30 BID>>20 BID>>pred 50>>soluemdrol 40 iv bid please watch surgars -reviewed pertinent imaging with patient today - ESR and CRP are elevated but improving -PT/OT for d/c planning  -please encourage patient to use incentive spirometer few times each hour while hospitalized.   -continue Cellcept 250 bid >>500 bid >>750BID -UOP is adequate -Discussed goal of care - recommend DNR patient is in agreement but still discussing with wife, palliative care service to follow  -lasix 20 daily PO     Thank you for allowing me to participate in the care of this patient.   Patient/Family are satisfied with care plan and all questions have been answered.   This  document was prepared using Dragon voice recognition software and may include unintentional dictation errors.     Ottie Glazier, M.D.  Division of [place]

## 2021-02-17 NOTE — Progress Notes (Addendum)
[facility] NAME: Edward Atkinson    MR#:  [id number]  DATE OF BIRTH:  02-19-1961  SUBJECTIVE:   wife at bedside. Patient appears very motivated and eager to go home with the goal on Friday. Breathing is stable. Gets exertional short of breath however patient is working on with incentive spirometer and flutter valve.  REVIEW OF SYSTEMS:   Review of Systems  Constitutional:  Negative for chills, fever and weight loss.  HENT:  Negative for ear discharge, ear pain and nosebleeds.   Eyes:  Negative for blurred vision, pain and discharge.  Respiratory:  Positive for cough and shortness of breath. Negative for sputum production, wheezing and stridor.   Cardiovascular:  Negative for chest pain, palpitations, orthopnea and PND.  Gastrointestinal:  Negative for abdominal pain, diarrhea, nausea and vomiting.  Genitourinary:  Negative for frequency and urgency.  Musculoskeletal:  Negative for back pain and joint pain.  Neurological:  Positive for weakness. Negative for sensory change, speech change and focal weakness.  Psychiatric/Behavioral:  Negative for depression and hallucinations. The patient is not nervous/anxious.   Tolerating Diet: yes Tolerating PT: home health  DRUG ALLERGIES:   Allergies  Allergen Reactions  . Penicillins Rash    Did it involve swelling of the face/tongue/throat, SOB, or low BP? No Did it involve sudden or severe rash/hives, skin peeling, or any reaction on the inside of your mouth or nose? No Did you need to seek medical attention at a hospital or doctor's office? No When did it last happen?      Unknown If all above answers are "NO", may proceed with cephalosporin use.    VITALS:  Blood pressure 126/76, pulse (!) 103, temperature 97.9 F (36.6 C), resp. rate 19, height 6' (1.829 m), weight 67.9 kg, SpO2 90 %.  PHYSICAL EXAMINATION:   Physical Exam  GENERAL:  60 y.o.-year-old patient lying in the bed  with no acute distress.  LUNGS: distant breath sounds bilaterally, no wheezing, rales, rhonchi. No use of accessory muscles of respiration.  CARDIOVASCULAR: S1, S2 normal. No murmurs, rubs, or gallops.  ABDOMEN: Soft, nontender, nondistended. Bowel sounds present. No organomegaly or mass.  EXTREMITIES: No cyanosis, clubbing or edema b/l.    NEUROLOGIC: Cranial nerves II through XII are intact. No focal Motor or sensory deficits b/l.  weak PSYCHIATRIC:  patient is alert and oriented x 3.  SKIN: No obvious rash, lesion, or ulcer.   LABORATORY PANEL:  CBC Recent Labs  Lab 02/13/21 0623  WBC 5.8  HGB 11.5*  HCT 33.3*  PLT 238    Chemistries  No results for input(s): NA, K, CL, CO2, GLUCOSE, BUN, CREATININE, CALCIUM, MG, AST, ALT, ALKPHOS, BILITOT in the last 168 hours.  Invalid input(s): GFRCGP Cardiac Enzymes No results for input(s): TROPONINI in the last 168 hours. RADIOLOGY:  No results found. ASSESSMENT AND PLAN:  CRISTOVAL TEALL is a 60 y.o. 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  Pt was admitted with severe sepsis secondary to committee acquired pneumonia and interstitial lung disease exacerbation complicated by acute on chronic hypoxemic respiratory failure.   Acute on chronic hypoxemic respiratory failure, exacerbation of interstitial lung disease/Restrictive lung dz/ bronchiectasis:  --pt remains on oral dexamethasone.  Follow-up with pulmonologist for further  recommendations. --cont to maintain sats >88%. Currently on 4L [place]/min with increase requirement during exertion. -- Continue use of flutter valve and incentive spirometer. -- on Cellcept per Dr Lanney Gins (steroid sparing regimen) --cont metaneb -- NIV/Trilogy for  home  Type II DM with severe hyperglycemia, hypoglycemia: -- He had hypoglycemia (glucose level 46) on the evening of 02/15/2021.   --now on insulin glargine to 15 units twice daily and NovoLog 10 units 3 times daily.   --Monitor glucose levels closely and adjust insulin therapy accordingly. -- Add glimipride (home med)  Paroxysmal atrial fibrillation: Continue Cardizem and Eliquis  Follicular lymphoma: Completed chemotherapy.  S/p removal of Port-A-Cath on 02/09/2021.   Immunocompromised state: He is on Bactrim for PCP prophylaxis    Procedures: Family communication :wife at bedside Consults :Pulmonary CODE STATUS: FULL DVT Prophylaxis :lovenoc Level of care: Progressive Cardiac Status is: Inpatient  Remains inpatient appropriate because: of ongoing respiratory issues Pt's goal is to go home on friday with HHPT,oxygen and Trilogy       TOTAL TIME TAKING CARE OF THIS PATIENT: 30 minutes.  >50% time spent on counselling and coordination of care  Note: This dictation was prepared with Dragon dictation along with smaller phrase technology. Any transcriptional errors that result from this process are unintentional.  Fritzi Mandes M.D    [facility]   CC: Primary care physician; Rusty Aus, MD Patient ID: Edward Atkinson, male   DOB: Oct 12, 1960, 60 y.o.   MRN: [id number]

## 2021-02-18 ENCOUNTER — Inpatient Hospital Stay: Payer: BC Managed Care – PPO

## 2021-02-18 LAB — GLUCOSE, CAPILLARY
Glucose-Capillary: 109 mg/dL — ABNORMAL HIGH (ref 70–99)
Glucose-Capillary: 280 mg/dL — ABNORMAL HIGH (ref 70–99)
Glucose-Capillary: 127 mg/dL — ABNORMAL HIGH (ref 70–99)
Glucose-Capillary: 108 mg/dL — ABNORMAL HIGH (ref 70–99)

## 2021-02-18 MED ORDER — DEXAMETHASONE 6 MG PO TABS
6.0000 mg | ORAL_TABLET | Freq: Two times a day (BID) | ORAL | Status: DC
  Administered 2021-02-18 – 2021-02-19 (×3): 6 mg via ORAL
  Filled 2021-02-18 (×4): qty 1

## 2021-02-18 NOTE — Progress Notes (Signed)
Pulmonary Medicine          Date: 02/18/2021,   MRN# [id number] Edward Atkinson July 15, 1960     AdmissionWeight: 63.5 kg                 CurrentWeight: 68.9 kg   Referring physician: Dr Roderic Palau   CHIEF COMPLAINT:   Acute on chronic hypoxemic respiratory failure   HISTORY OF PRESENT ILLNESS   60 yo M w/hx of chronic hypoxemia and ILD post COVID19, T2DM, PE on eliquis, Lymphoma s/p Rituxan which is completed.  Came in due to worsening SOB and DOE specifically mild exertional defacation/urination with feelings of presyncope and severe dyspnea which started to get worse post tapering from steroid appx few days prior to onset of symptom progression.  He was placed on solumedrol and reported mild improvement.  He is on 10L /min [place] during my evaluation.  He had CT chest done with PE protocol and noted to have bilateral GGO infiltrates worse compared to June study. PCCM consultation for further evaluation and management.   02/16/21- goal to dc Friday to home with PT. Reducing dexamethasone tommorow  02/17/21- patietn remains on 4L/min [place] he is slowly improving and plan to go homwith NIV ventilator and Afflo vest therapy 02/18/21- no acute changes overnight. Dexamethasone 6 BID, Bactrim ss daily, Cellcept 750 bid   PAST MEDICAL HISTORY   Past Medical History:  Diagnosis Date  . Chronic respiratory failure with hypoxia (HCC)    2L  continuous  . COVID-19   . Diabetes mellitus without complication ([place])   . Diverticulitis   . Follicular lymphoma of intra-abdominal lymph nodes ([place]) 02/15/2019  . ILD (interstitial lung disease) ([place])      SURGICAL HISTORY   Past Surgical History:  Procedure Laterality Date  . CHOLECYSTECTOMY    . COLON RESECTION     DUE TO DIVERTICULITIS  . FLEXIBLE BRONCHOSCOPY N/A 12/02/2019   Procedure: FLEXIBLE BRONCHOSCOPY;  Surgeon: Ottie Glazier, MD;  Location: [facility];  Service: Thoracic;  Laterality: N/A;  . PORTA CATH INSERTION N/A  03/11/2019   Procedure: PORTA CATH INSERTION;  Surgeon: Algernon Huxley, MD;  Location: [place] [facility];  Service: Cardiovascular;  Laterality: N/A;  . PORTA CATH REMOVAL Right 02/09/2021   Procedure: PORTA CATH REMOVAL;  Surgeon: Katha Cabal, MD;  Location: [place] [facility];  Service: Cardiovascular;  Laterality: Right;  . PULMONARY THROMBECTOMY N/A 01/29/2019   Procedure: PULMONARY THROMBECTOMY;  Surgeon: Algernon Huxley, MD;  Location: [place] [facility];  Service: Cardiovascular;  Laterality: N/A;     FAMILY HISTORY   Family History  Problem Relation Age of Onset  . Diabetes Brother      SOCIAL HISTORY   Social History   Tobacco Use  . Smoking status: Never  . Smokeless tobacco: Never  Vaping Use  . Vaping Use: Never used  Substance Use Topics  . Alcohol use: Yes    Alcohol/week: 0.0 - 2.0 standard drinks    Comment: occasional  . Drug use: Never     MEDICATIONS    Home Medication:    Current Medication:  Current Facility-Administered Medications:  .  acetaminophen (TYLENOL) tablet 650 mg, 650 mg, Oral, Q6H PRN, 650 mg at 02/16/21 0726 **OR** acetaminophen (TYLENOL) suppository 650 mg, 650 mg, Rectal, Q6H PRN, Oswald Hillock, RPH .  ALPRAZolam Duanne Moron) tablet 0.25 mg, 0.25 mg, Oral, QHS PRN, Sharen Hones, MD, 0.25 mg at 02/13/21 2148 .  ALPRAZolam (  XANAX) tablet 0.25 mg, 0.25 mg, Oral, q morning, Sharen Hones, MD, 0.25 mg at 02/18/21 0859 .  apixaban (ELIQUIS) tablet 5 mg, 5 mg, Oral, BID, Beers, Shanon Brow, RPH, 5 mg at 02/18/21 0859 .  dexamethasone (DECADRON) tablet 6 mg, 6 mg, Oral, Q12H, Fritzi Mandes, MD, 6 mg at 02/18/21 0904 .  digoxin (LANOXIN) tablet 0.125 mg, 0.125 mg, Oral, Daily, Memon, Jolaine Artist, MD, 0.125 mg at 02/17/21 0851 .  diltiazem (CARDIZEM CD) 24 hr capsule 120 mg, 120 mg, Oral, Daily, Lashya Passe, MD, 120 mg at 02/18/21 0859 .  diltiazem (CARDIZEM) injection 10 mg, 10 mg, Intravenous, Q6H PRN, Wyvonnia Dusky, MD, 10 mg at  01/24/21 2328 .  docusate sodium (COLACE) capsule 200 mg, 200 mg, Oral, BID, Wyvonnia Dusky, MD, 200 mg at 02/18/21 0859 .  feeding supplement (NEPRO CARB STEADY) liquid 237 mL, 237 mL, Oral, TID BM, Memon, Jehanzeb, MD, Last Rate: 0 mL/hr at 01/21/21 2130, 237 mL at 02/18/21 0904 .  glimepiride (AMARYL) tablet 2 mg, 2 mg, Oral, q morning, Fritzi Mandes, MD, 2 mg at 02/18/21 0905 .  guaiFENesin (MUCINEX) 12 hr tablet 1,200 mg, 1,200 mg, Oral, BID, Memon, Jehanzeb, MD, 1,200 mg at 02/18/21 0900 .  guaiFENesin-dextromethorphan (ROBITUSSIN DM) 100-10 MG/5ML syrup 5 mL, 5 mL, Oral, Q4H PRN, Kathie Dike, MD, 5 mL at 02/04/21 0850 .  insulin aspart (novoLOG) injection 0-15 Units, 0-15 Units, Subcutaneous, TID WC, Sharen Hones, MD, 2 Units at 02/17/21 1244 .  insulin aspart (novoLOG) injection 0-5 Units, 0-5 Units, Subcutaneous, QHS, Sharen Hones, MD, 2 Units at 02/17/21 2135 .  insulin aspart (novoLOG) injection 10 Units, 10 Units, Subcutaneous, TID WC, Jennye Boroughs, MD, 10 Units at 02/18/21 0900 .  insulin glargine-yfgn (SEMGLEE) injection 15 Units, 15 Units, Subcutaneous, BID, Jennye Boroughs, MD, 15 Units at 02/18/21 0905 .  ipratropium-albuterol (DUONEB) 0.5-2.5 (3) MG/3ML nebulizer solution 3 mL, 3 mL, Nebulization, TID, Jennye Boroughs, MD, 3 mL at 02/18/21 0914 .  lactulose (CHRONULAC) 10 GM/15ML solution 30 g, 30 g, Oral, BID PRN, Leslye Peer, Richard, MD .  levalbuterol [facility]) nebulizer solution 1.25 mg, 1.25 mg, Nebulization, Q4H PRN, Howerter, Justin B, DO, 1.25 mg at 01/17/21 2011 .  morphine 2 MG/ML injection 1 mg, 1 mg, Intravenous, Q3H PRN, Ottie Glazier, MD, 1 mg at 02/15/21 1358 .  multivitamin with minerals tablet 1 tablet, 1 tablet, Oral, Daily, Kathie Dike, MD, 1 tablet at 02/18/21 0859 .  mycophenolate (CELLCEPT) capsule 750 mg, 750 mg, Oral, BID, Lanney Gins, Neriyah Cercone, MD, 750 mg at 02/18/21 0904 .  ondansetron (ZOFRAN) injection 4 mg, 4 mg, Intravenous, Q6H PRN, Stegmayer,  Kimberly A, PA-C .  polyethylene glycol (MIRALAX / GLYCOLAX) packet 17 g, 17 g, Oral, Daily, Leslye Peer, Richard, MD, 17 g at 02/17/21 2725 .  simethicone (MYLICON) chewable tablet 80 mg, 80 mg, Oral, QID PRN, Loletha Grayer, MD, 80 mg at 01/29/21 1411 .  sodium chloride (OCEAN) 0.65 % nasal spray 1 spray, 1 spray, Each Nare, PRN, Loletha Grayer, MD, 1 spray at 01/30/21 2142 .  sulfamethoxazole-trimethoprim (BACTRIM) 400-80 MG per tablet 1 tablet, 1 tablet, Oral, Q12H, Ottie Glazier, MD, 1 tablet at 02/18/21 0905 .  traZODone (DESYREL) tablet 50 mg, 50 mg, Oral, QHS PRN, Sharion Settler, NP, 50 mg at 02/17/21 2136    ALLERGIES   Penicillins     REVIEW OF SYSTEMS    Review of Systems:  Gen:  Denies  fever, sweats, chills weigh loss  HEENT: Denies blurred vision, double  vision, ear pain, eye pain, hearing loss, nose bleeds, sore throat Cardiac:  No dizziness, chest pain or heaviness, chest tightness,edema Resp:  admits to severe dyspnea Gi: Denies swallowing difficulty, stomach pain, nausea or vomiting, diarrhea, constipation, bowel incontinence Gu:  Denies bladder incontinence, burning urine Ext:   Denies Joint pain, stiffness or swelling Skin: Denies  skin rash, easy bruising or bleeding or hives Endoc:  Denies polyuria, polydipsia , polyphagia or weight change Psych:   admits to axniety  Other:  All other systems negative   VS: BP 122/69 (BP Location: Right Arm)   Pulse 81   Temp 98.1 F (36.7 C) (Oral)   Resp 19   Ht 6' (1.829 m)   Wt 68.9 kg   SpO2 97%   BMI 20.61 kg/m      PHYSICAL EXAM    GENERAL:NAD, no fevers, chills, no weakness no fatigue HEAD: Normocephalic, atraumatic.  EYES: Pupils equal, round, reactive to light. Extraocular muscles intact. No scleral icterus.  MOUTH: Moist mucosal membrane. Dentition intact. No abscess noted.  EAR, NOSE, THROAT: Clear without exudates. No external lesions.  NECK: Supple. No thyromegaly. No nodules. No JVD.   PULMONARY: Bilateral rhonchi improved from previous CARDIOVASCULAR: S1 and S2. Regular rate and rhythm. No murmurs, rubs, or gallops. No edema. Pedal pulses 2+ bilaterally.  GASTROINTESTINAL: Soft, nontender, nondistended. No masses. Positive bowel sounds. No hepatosplenomegaly.  MUSCULOSKELETAL: No swelling, clubbing, or edema. Range of motion full in all extremities.  NEUROLOGIC: Cranial nerves II through XII are intact. No gross focal neurological deficits. Sensation intact. Reflexes intact.  SKIN: No ulceration, lesions, rashes, or cyanosis. Skin warm and dry. Turgor intact.  PSYCHIATRIC: Mood, affect within normal limits. The patient is awake, alert and oriented x 3. Insight, judgment intact.       IMAGING    CT Chest High Resolution  Result Date: 02/07/2021 CLINICAL DATA:  Inpatient. Interstitial lung disease. Worsening dyspnea. Chronic hypoxemia post COVID-19. History of lymphoma treated with Rituxan. EXAM: CT CHEST WITHOUT CONTRAST TECHNIQUE: Multidetector CT imaging of the chest was performed following the standard protocol without intravenous contrast. High resolution imaging of the lungs, as well as inspiratory and expiratory imaging, was performed. COMPARISON:  02/05/2021 chest radiograph. 01/13/2021 chest CT angiogram. FINDINGS: Cardiovascular: Normal heart size. No significant pericardial effusion/thickening. Right internal jugular central venous catheter terminates at the cavoatrial junction. Normal course and caliber of the thoracic aorta. Stable dilated main pulmonary artery (3.7 cm diameter). Mediastinum/Nodes: No discrete thyroid nodules. Unremarkable esophagus. No pathologically enlarged axillary, mediastinal or hilar lymph nodes, noting limited sensitivity for the detection of hilar adenopathy on this noncontrast study. Lungs/Pleura: No pneumothorax. No pleural effusion. Severe patchy confluent peribronchovascular and peripheral ground-glass opacity, reticulation, traction  bronchiectasis, architectural distortion and volume loss in both lungs with a basilar predominance. The ground-glass opacities have mildly improved since 01/13/2021 chest CT. The bronchiectasis, distortion and volume loss appear slightly worsened. No significant lobular air trapping or evidence of tracheobronchomalacia on the expiration sequence. No acute consolidative airspace disease, lung masses or significant pulmonary nodules. No frank honeycombing. Upper abdomen: Chronic mild elevation of the right hemidiaphragm. Musculoskeletal: No aggressive appearing focal osseous lesions. Mild thoracic spondylosis. IMPRESSION: 1. Spectrum of findings compatible with severe basilar predominant fibrotic interstitial lung disease with ground-glass and no frank honeycombing predominance. Ground-glass opacities have slightly improved and bronchiectasis and other findings of fibrosis have slightly worsened since recent 01/13/2021 chest CT angiogram study. Findings are most compatible with a severe rapidly progressive postinflammatory fibrosis or  fibrotic NSIP pattern. Findings are suggestive of an alternative diagnosis (not UIP) per consensus guidelines: Diagnosis of Idiopathic Pulmonary Fibrosis: An Official ATS/ERS/JRS/ALAT Clinical Practice Guideline. [place], Iss 5, [phone], Dec 31 2016. 2. Stable dilated main pulmonary artery, suggesting chronic pulmonary arterial hypertension. 3. Chronic mild elevation of the right hemidiaphragm. Electronically Signed   By: Ilona Sorrel M.D.   On: 02/07/2021 17:29   PERIPHERAL VASCULAR CATHETERIZATION  Result Date: 02/09/2021 See surgical note for result.  DG Chest Port 1 View  Result Date: 02/13/2021 CLINICAL DATA:  Atelectasis EXAM: PORTABLE CHEST 1 VIEW COMPARISON:  02/05/2021 FINDINGS: Low lung volumes with similar appearance of interstitial lung disease, left greater than right. No pleural effusion. Right chest wall port has been removed. Stable  cardiomediastinal contours. IMPRESSION: Interval removal of chest port. Interstitial lung disease with similar lung aeration to prior study. Electronically Signed   By: Macy Mis M.D.   On: 02/13/2021 13:21   DG Chest Port 1 View  Result Date: 02/05/2021 CLINICAL DATA:  Bilateral pulmonary infiltrates on chest x-ray. EXAM: PORTABLE CHEST 1 VIEW COMPARISON:  Chest radiograph 02/01/2021 FINDINGS: Power injectable right IJ port central venous catheter tip projects at the level of the superior cavoatrial junction. Unchanged low lung volumes with lower lobe predominant interstitial opacities, left greater than right. No new focal consolidation, pleural effusion, or pneumothorax. Heart is normal in size. Surgical clips in the right upper abdomen. IMPRESSION: Bilateral interstitial airspace opacities are unchanged compared to 02/01/2021. No new focal consolidation, pleural effusion, or pneumothorax. Electronically Signed   By: Ileana Roup M.D.   On: 02/05/2021 10:59   DG Chest Port 1 View  Result Date: 02/01/2021 CLINICAL DATA:  Shortness of breath, edema EXAM: PORTABLE CHEST 1 VIEW COMPARISON:  01/30/2021 FINDINGS: Stable positioning of right IJ approach Port-A-Cath. Heart size within normal limits. Low lung volumes. Slight interval worsening of bilateral airspace opacities, left worse than right. No pleural effusion or pneumothorax. IMPRESSION: Slight interval worsening of bilateral airspace opacities, left worse than right. Electronically Signed   By: Davina Poke D.O.   On: 02/01/2021 14:37   DG Chest Port 1 View  Result Date: 01/30/2021 CLINICAL DATA:  Acute on chronic hypoxemic respiratory failure. EXAM: PORTABLE CHEST 1 VIEW COMPARISON:  January 28, 2021 FINDINGS: No pneumothorax. Stable cardiomediastinal silhouette and right Port-A-Cath. Left-greater-than-right pulmonary opacities/infiltrates. No other interval changes. IMPRESSION: 1. Persistent low lung volumes and bilateral pulmonary  infiltrates. No interval changes. Electronically Signed   By: Dorise Bullion III M.D.   On: 01/30/2021 08:36   DG Chest Port 1 View  Result Date: 01/28/2021 CLINICAL DATA:  Shortness of breath. EXAM: PORTABLE CHEST 1 VIEW COMPARISON:  Multiple recent chest films. FINDINGS: The right IJ power port is stable. Persistent low lung volumes and bilateral pulmonary infiltrates. No definite pleural effusion or pneumothorax. IMPRESSION: Persistent low lung volumes and bilateral pulmonary infiltrates. Electronically Signed   By: Marijo Sanes M.D.   On: 01/28/2021 16:03   DG Chest Port 1 View  Result Date: 01/26/2021 CLINICAL DATA:  Shortness of breath, abnormal chest x-ray EXAM: PORTABLE CHEST 1 VIEW COMPARISON:  01/24/2021 FINDINGS: Right Port-A-Cath remains in place, unchanged. Low lung volumes with bilateral airspace opacities, most pronounced in the left perihilar and lower lobe, unchanged since prior study. No visible effusions or pneumothorax. Heart is normal size. IMPRESSION: Low lung volumes with bilateral airspace disease, left-greater-than-right. No significant change. Electronically Signed   By: Rolm Baptise M.D.  On: 01/26/2021 09:42   DG Chest Port 1 View  Result Date: 01/24/2021 CLINICAL DATA:  Follow-up multifocal pneumonia. EXAM: PORTABLE CHEST 1 VIEW COMPARISON:  01/22/2021 and older exams. FINDINGS: Mild interval decrease in the airspace lung opacities, most evident on the left. Lung volumes remain low. No new lung abnormalities. No visualized pleural effusion.  No pneumothorax. Right internal jugular Port-A-Cath is stable. IMPRESSION: 1. Mild interval improvement in lung aeration with decreased airspace lung opacities, most notably on the left. Electronically Signed   By: Lajean Manes M.D.   On: 01/24/2021 07:57   DG Chest Port 1 View  Result Date: 01/22/2021 CLINICAL DATA:  Hypoxia, shortness of breath. EXAM: PORTABLE CHEST 1 VIEW COMPARISON:  January 20, 2021. FINDINGS: Stable  cardiomediastinal silhouette. Right internal jugular Port-A-Cath is unchanged in position. Hypoinflation of the lungs is noted. Stable bilateral lung opacities are noted, left greater than right, most consistent with multifocal pneumonia. Bony thorax is unremarkable. IMPRESSION: Hypoinflation of the lungs. Stable bilateral lung opacities are noted, left greater than right, most consistent with multifocal pneumonia. Electronically Signed   By: Marijo Conception M.D.   On: 01/22/2021 19:26   DG Chest Port 1 View  Result Date: 01/20/2021 CLINICAL DATA:  Pneumonitis EXAM: PORTABLE CHEST 1 VIEW COMPARISON:  01/19/2021 FINDINGS: Shallow inspiration. Patchy infiltrates in the left lung and right lung base, similar to prior study and likely multifocal pneumonia. No pleural effusions. No pneumothorax. Mediastinal contours appear intact. Heart size is normal. Power port type central venous catheter on the right with tip over the cavoatrial junction region. Surgical clips in the right upper quadrant. IMPRESSION: Shallow inspiration. Patchy infiltrates in both lungs, likely multifocal pneumonia, without change. Electronically Signed   By: Lucienne Capers M.D.   On: 01/20/2021 03:56           ASSESSMENT/PLAN   Acute on hypoxemic respiratory failure Due to acute exacerbation of Chronic lung disease/ILD with Critical illness Myopathy associated respiratory failure - present on admission  - COVID19 negative   - supplemental O2 during my evaluation 10l/min>>8L>>HFNC 7L -Respiratory viral panel-negative  -serum fungitell-negative  -legionella ab-negative  -nasal MRSA PCR-negative -Procalcitonin trend reviewed -strep pneumoniae ur AG -Histoplasma Ur Ag-negative -sputum resp cultures -AFB sputum expectorated specimen -sputum cytology  -completed  Cefepime course for cap -Reduced Solumedrol to 40 BID - 01/15/21>>50 BID>>40 BID>>30 BID>>20 BID>>pred 50>>soluemdrol 40 iv bid please watch surgars -reviewed  pertinent imaging with patient today - ESR and CRP are elevated but improving -PT/OT for d/c planning  -please encourage patient to use incentive spirometer few times each hour while hospitalized.   -continue Cellcept 250 bid >>500 bid >>750BID -UOP is adequate -Discussed goal of care - recommend DNR patient is in agreement but still discussing with wife, palliative care service to follow  -lasix 20 daily PO     Thank you for allowing me to participate in the care of this patient.   Patient/Family are satisfied with care plan and all questions have been answered.   This document was prepared using Dragon voice recognition software and may include unintentional dictation errors.     Ottie Glazier, M.D.  Division of [place]

## 2021-02-18 NOTE — Progress Notes (Signed)
PT Cancellation Note  Patient Details Name: Edward Atkinson MRN: [id number] DOB: 07/26/1960   Cancelled Treatment:    Reason Eval/Treat Not Completed: Patient declined. Patient politely declined PT today. He reports he is going home tomorrow and does not want to jeopardize the discharge plan in any way. Home health  PT recommended when discharged.   Minna Merritts, PT, MPT  Percell Locus 02/18/2021, 1:21 PM

## 2021-02-18 NOTE — Progress Notes (Signed)
[facility] [place] NAME: Edward Atkinson    MR#:  [id number]  DATE OF BIRTH:  13-Aug-1960  SUBJECTIVE:   wife at bedside. Patient appears very motivated and eager to go home with the goal on Friday. Breathing is stable.  Slept well. Feels overall good REVIEW OF SYSTEMS:   Review of Systems  Constitutional:  Negative for chills, fever and weight loss.  HENT:  Negative for ear discharge, ear pain and nosebleeds.   Eyes:  Negative for blurred vision, pain and discharge.  Respiratory:  Positive for cough and shortness of breath. Negative for sputum production, wheezing and stridor.   Cardiovascular:  Negative for chest pain, palpitations, orthopnea and PND.  Gastrointestinal:  Negative for abdominal pain, diarrhea, nausea and vomiting.  Genitourinary:  Negative for frequency and urgency.  Musculoskeletal:  Negative for back pain and joint pain.  Neurological:  Positive for weakness. Negative for sensory change, speech change and focal weakness.  Psychiatric/Behavioral:  Negative for depression and hallucinations. The patient is not nervous/anxious.   Tolerating Diet: yes Tolerating PT: home health  DRUG ALLERGIES:   Allergies  Allergen Reactions  . Penicillins Rash    Did it involve swelling of the face/tongue/throat, SOB, or low BP? No Did it involve sudden or severe rash/hives, skin peeling, or any reaction on the inside of your mouth or nose? No Did you need to seek medical attention at a hospital or doctor's office? No When did it last happen?      Unknown If all above answers are "NO", may proceed with cephalosporin use.    VITALS:  Blood pressure 122/69, pulse 81, temperature 98.1 F (36.7 C), temperature source Oral, resp. rate 19, height 6' (1.829 m), weight 68.9 kg, SpO2 97 %.  PHYSICAL EXAMINATION:   Physical Exam  GENERAL:  60 y.o.-year-old patient lying in the bed with no acute distress.  LUNGS: distant breath sounds  bilaterally, no wheezing, rales, rhonchi. No use of accessory muscles of respiration.  CARDIOVASCULAR: S1, S2 normal. No murmurs, rubs, or gallops.  ABDOMEN: Soft, nontender, nondistended. Bowel sounds present. No organomegaly or mass.  EXTREMITIES: No cyanosis, clubbing or edema b/l.    NEUROLOGIC: Cranial nerves II through XII are intact. No focal Motor or sensory deficits b/l.  weak PSYCHIATRIC:  patient is alert and oriented x 3.  SKIN: No obvious rash, lesion, or ulcer.   LABORATORY PANEL:  CBC Recent Labs  Lab 02/13/21 0623  WBC 5.8  HGB 11.5*  HCT 33.3*  PLT 238     Chemistries  No results for input(s): NA, K, CL, CO2, GLUCOSE, BUN, CREATININE, CALCIUM, MG, AST, ALT, ALKPHOS, BILITOT in the last 168 hours.  Invalid input(s): GFRCGP Cardiac Enzymes No results for input(s): TROPONINI in the last 168 hours. RADIOLOGY:  No results found. ASSESSMENT AND PLAN:  ED MANDICH is a 60 y.o. 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  Pt was admitted with severe sepsis secondary to committee acquired pneumonia and interstitial lung disease exacerbation complicated by acute on chronic hypoxemic respiratory failure.   Acute on chronic hypoxemic respiratory failure, exacerbation of interstitial lung disease/Restrictive lung dz/ bronchiectasis:  --pt remains on oral dexamethasone 6 mg bid.  Follow-up with pulmonologist for further recommendations. --cont to maintain sats >  88%. Currently on 4L [place]/min with increase requirement during exertion. -- Continue use of flutter valve and incentive spirometer. -- on Cellcept per Dr Lanney Gins (steroid sparing regimen) --cont metaneb -- NIV/Trilogy and oxygen, for home  Type II DM with severe  hyperglycemia, hypoglycemia: -- He had hypoglycemia (glucose level 46) on the evening of 02/15/2021.   --now on insulin glargine to 15 units twice daily and NovoLog 10 units 3 times daily.   --Monitor glucose levels closely and adjust insulin therapy accordingly. -- Add glimipride (home med)  Paroxysmal atrial fibrillation: Continue Cardizem and Eliquis  Follicular lymphoma: Completed chemotherapy.  S/p removal of Port-A-Cath on 02/09/2021.   Immunocompromised state: He is on Bactrim for PCP prophylaxis    Family communication :wife at bedside Consults :Pulmonary CODE STATUS: FULL DVT Prophylaxis :lovenoc Level of care: Progressive Cardiac Status is: Inpatient  Remains inpatient appropriate because: of ongoing respiratory issues Pt's goal is to go home on friday with HHPT,oxygen and Trilogy  D/c cont oxygen and tele-monitoring today     TOTAL TIME TAKING CARE OF THIS PATIENT: 25 minutes.  >50% time spent on counselling and coordination of care  Note: This dictation was prepared with Dragon dictation along with smaller phrase technology. Any transcriptional errors that result from this process are unintentional.  Fritzi Mandes M.D    [facility]   CC: Primary care physician; Rusty Aus, MD Patient ID: Leland Johns, male   DOB: [birth year], 60 y.o.   MRN: [id number]

## 2021-02-18 NOTE — TOC Initial Note (Signed)
Transition of Care [facility]) - Initial/Assessment Note    Patient Details  Name: Edward Atkinson MRN: [id number] Date of Birth: 18-Feb-1961  [facility] CM/SW Contact:    Alberteen Sam, LCSW Phone Number: 02/18/2021, 2:57 PM  Clinical Narrative:                  CSW spoke with patient's wife Edward Atkinson, confirmed she will be the one to pick patient up tomorrow. Is set up with [facility] for PT and RN. Agreeable to rolling walker and bedside commode to be delivered to hospital room via [employer] prior to dc. Edward Atkinson reports being in communication with [employer] regarding NIV and Afflovest. Edward Atkinson aware at discharge to bring home oxygen for transportation needs   No other discharge needs identified at this time.   Expected Discharge Plan: [place] Barriers to Discharge: Continued Medical Work up   Patient Goals and CMS Choice Patient states their goals for this hospitalization and ongoing recovery are:: to go home CMS Medicare.gov Compare Post Acute Care list provided to:: Patient Represenative (must comment) (spouse) Choice offered to / list presented to : Spouse  Expected Discharge Plan and Services Expected Discharge Plan: [place] Acute Care Choice: [place] arrangements for the past 2 months: Single Family Home                 DME Arranged: NIV, Bedside commode, Walker rolling DME Agency: [employer] Date DME Agency Contacted: 02/18/21 Time DME Agency Contacted: [phone] Representative spoke with at DME Agency: [place]: PT, RN [place] Agency: [place] Date [place]: 02/18/21 Time [place]: [place] Representative spoke with at [place]: Tommi Rumps  Prior Living Arrangements/Services Living arrangements for the past 2 months: [place] with:: Spouse Patient language and need for interpreter reviewed:: Yes Do you feel safe going back to the place where you live?: Yes       Need for Family Participation in Patient Care: Yes (Comment) Care giver support system in place?: Yes (comment) Current home services: DME Criminal Activity/Legal Involvement Pertinent to Current Situation/Hospitalization: No - Comment as needed  Activities of Daily Living Home Assistive Devices/Equipment: Oxygen ADL Screening (condition at time of admission) Patient's cognitive ability adequate to safely complete daily activities?: Yes Is the patient deaf or have difficulty hearing?: No Does the patient have difficulty seeing, even when wearing glasses/contacts?: No Does the patient have difficulty concentrating, remembering, or making decisions?: No Patient able to express need for assistance with ADLs?: Yes Does the patient have difficulty dressing or bathing?: No Independently performs ADLs?: Yes (appropriate for developmental age) Does the patient have difficulty walking or climbing stairs?: No Weakness of Legs: None Weakness of Arms/Hands: None  Permission Sought/Granted Permission sought to share information with : Family Supports Permission granted to share information with : Yes, Verbal Permission Granted  Share Information with NAME: Edward Atkinson     Permission granted to share info w Relationship: Wife  Permission granted to share info w Contact Information: [phone]  Emotional Assessment Appearance:: Appears stated age Attitude/Demeanor/Rapport: Engaged, Gracious Affect (typically observed): Accepting, Appropriate, Calm, Pleasant Orientation: : Oriented to Self, Oriented to Place, Oriented to  Time, Oriented to Situation Alcohol / Substance Use: Not Applicable Psych Involvement: No (comment)  Admission diagnosis:  Shortness of breath [R06.02] Hypoxia [R09.02] Acute on chronic respiratory failure with hypoxia ([place]) [J96.21] Patient Active Problem List  Diagnosis Date Noted   Uncontrolled type 2 diabetes mellitus with hyperglycemia ([place]) 02/07/2021    Hyponatremia 02/03/2021   Pressure injury of skin 02/02/2021   Leukocytosis    AF (paroxysmal atrial fibrillation) ([place])    Thrombocytosis    Protein-calorie malnutrition, severe 01/15/2021   Severe sepsis ([place]) 01/14/2021   CAP (community acquired pneumonia) 01/14/2021   Elevated troponin 01/14/2021   Protein calorie malnutrition ([place]) 01/14/2021   Acute on chronic respiratory failure with hypoxia ([place]) 01/13/2021   Encounter for monoclonal antibody treatment for malignancy 07/02/2020   Interstitial lung disease ([place]) 07/02/2020   Lymphocytopenia    SOB (shortness of breath)    Sepsis ([place]) 07/29/2019   Thrombocytopenia ([place]) 07/29/2019   History of DVT (deep vein thrombosis) 07/06/2019   Transaminitis 07/06/2019   History of pulmonary embolism 07/06/2019   Encounter for antineoplastic chemotherapy 04/29/2019   Pulmonary embolus ([place]) 03/49/1791   Follicular lymphoma ([place]) 02/15/2019   Goals of care, counseling/discussion 02/15/2019   DVT (deep venous thrombosis) ([place]) 02/12/2019   Prostatitis 50/56/9794   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[place]) 02/06/2019   Other pulmonary embolism with acute cor pulmonale ([place]) 02/06/2019   Type 2 diabetes mellitus with hypoglycemia without coma, without long-term current use of insulin ([place]) 02/06/2019   Massive pulmonary hemorrhage originating in perinatal period 01/29/2019   PCP:  Rusty Aus, MD Pharmacy:   [place], [place] - [address] [place] [address] Phone: [phone] Fax: [phone]  [id number] [place], [place] [address] [place] [address] Phone: [phone] Fax: [phone]     Social Determinants of Health ([place]) Interventions    Readmission Risk Interventions Readmission Risk Prevention Plan 01/17/2021  Transportation Screening Complete  PCP or Specialist Appt within 3-5 Days Complete  Social Work Consult for [place]  Planning/Counseling Complete  Palliative Care Screening Not Applicable  Medication Review Press photographer) Complete  Some recent data might be hidden

## 2021-02-19 LAB — GLUCOSE, CAPILLARY
Glucose-Capillary: 177 mg/dL — ABNORMAL HIGH (ref 70–99)
Glucose-Capillary: 146 mg/dL — ABNORMAL HIGH (ref 70–99)

## 2021-02-19 MED ORDER — MYCOPHENOLATE MOFETIL 250 MG PO CAPS: 750.0000 mg | ORAL_CAPSULE | Freq: Two times a day (BID) | ORAL | 1 refills | Status: AC

## 2021-02-19 MED ORDER — BUDESONIDE 1 MG/2ML IN SUSP: 1.0000 mg | Freq: Every day | RESPIRATORY_TRACT | 12 refills | Status: AC

## 2021-02-19 MED ORDER — INSULIN GLARGINE-YFGN 100 UNIT/ML ~~LOC~~ SOLN: 15.0000 [IU] | Freq: Two times a day (BID) | SUBCUTANEOUS | 11 refills | Status: AC

## 2021-02-19 MED ORDER — DEXAMETHASONE 1 MG PO TABS: 6.0000 mg | ORAL_TABLET | Freq: Two times a day (BID) | ORAL | 1 refills | Status: AC

## 2021-02-19 MED ORDER — ALBUTEROL SULFATE (2.5 MG/3ML) 0.083% IN NEBU: 2.5000 mg | INHALATION_SOLUTION | RESPIRATORY_TRACT | 12 refills | Status: AC | PRN

## 2021-02-19 MED ORDER — ALPRAZOLAM 0.25 MG PO TABS: 0.2500 mg | ORAL_TABLET | Freq: Two times a day (BID) | ORAL | 0 refills | Status: AC | PRN

## 2021-02-19 MED ORDER — ADULT MULTIVITAMIN W/MINERALS CH: 1.0000 | ORAL_TABLET | Freq: Every day | ORAL | 0 refills | Status: AC

## 2021-02-19 MED ORDER — DILTIAZEM HCL ER COATED BEADS 120 MG PO CP24: 120.0000 mg | ORAL_CAPSULE | Freq: Every day | ORAL | 1 refills | Status: AC

## 2021-02-19 MED ORDER — APIXABAN 5 MG PO TABS: 5.0000 mg | ORAL_TABLET | Freq: Two times a day (BID) | ORAL | 2 refills | Status: AC

## 2021-02-19 MED ORDER — SULFAMETHOXAZOLE-TRIMETHOPRIM 400-80 MG PO TABS: 1.0000 | ORAL_TABLET | Freq: Two times a day (BID) | ORAL | 3 refills | Status: AC

## 2021-02-19 NOTE — TOC Transition Note (Signed)
Transition of Care [facility]) - CM/SW Discharge Note   Patient Details  Name: Edward Atkinson MRN: [id number] Date of Birth: 1960/10/03  [facility] CM/SW Contact:  Alberteen Sam, LCSW Phone Number: 02/19/2021, 12:25 PM   Clinical Narrative:     Patient to discharge home with home health, is set up with [facility] home health for PT and RN. [employer] has set up oxygen, NIV and afflovest and will visit patient today. Walker and 3in1 have been delivered to patient room via [place] and wife to pick up.   No other needs identified at this time.   Final next level of care: [place] Barriers to Discharge: No Barriers Identified   Patient Goals and CMS Choice Patient states their goals for this hospitalization and ongoing recovery are:: to go home CMS Medicare.gov Compare Post Acute Care list provided to:: Patient Choice offered to / list presented to : Patient  Discharge Placement                    Patient and family notified of of transfer: 02/19/21  Discharge Plan and Services     Post Acute Care Choice: Home Health          DME Arranged: NIV, [place] Rile, 3-N-1 DME Agency: [employer] Date DME Agency Contacted: 02/19/21 Time DME Agency Contacted: 89 Representative spoke with at DME Agency: [place]: PT, RN [facility] Agency: [place] Date [place]: 02/18/21 Time [place]: [id number] Representative spoke with at [place]: [place] ([place]) Interventions     Readmission Risk Interventions Readmission Risk Prevention Plan 01/17/2021  Transportation Screening Complete  PCP or Specialist Appt within 3-5 Days Complete  Social Work Consult for [place] Planning/Counseling [place] Not Applicable  Medication Review Press photographer) Complete  Some recent data might be hidden

## 2021-02-19 NOTE — Progress Notes (Signed)
Discharge instructions explained to pt and pts spouse/ verbalized an understanding/ iv removed/ will transport off unit via wheelchair.

## 2021-02-19 NOTE — Discharge Instructions (Signed)
Keep a log of your sugar check at home and discuss with Dr. Sabra Heck continue nebulizer, oxygen, inhalers as before

## 2021-02-19 NOTE — Progress Notes (Signed)
Pulmonary Medicine          Date: 02/19/2021,   MRN# [id number] Edward Atkinson February 17, 1961     AdmissionWeight: 63.5 kg                 CurrentWeight: 68.9 kg   Referring physician: Dr Roderic Palau   CHIEF COMPLAINT:   Acute on chronic hypoxemic respiratory failure   HISTORY OF PRESENT ILLNESS   60 yo M w/hx of chronic hypoxemia and ILD post COVID19, T2DM, PE on eliquis, Lymphoma s/p Rituxan which is completed.  Came in due to worsening SOB and DOE specifically mild exertional defacation/urination with feelings of presyncope and severe dyspnea which started to get worse post tapering from steroid appx few days prior to onset of symptom progression.  He was placed on solumedrol and reported mild improvement.  He is on 10L /min [place] during my evaluation.  He had CT chest done with PE protocol and noted to have bilateral GGO infiltrates worse compared to June study. PCCM consultation for further evaluation and management.   02/16/21- goal to dc Friday to home with PT. Reducing dexamethasone tommorow  02/17/21- patietn remains on 4L/min [place] he is slowly improving and plan to go homwith NIV ventilator and Afflo vest therapy 02/18/21- no acute changes overnight. Dexamethasone 6 BID, Bactrim ss daily, Cellcept 750 bid  02/19/21- plan to dc home with tapering dose of steroids on dexamethasone 6 bid with plan to reduce by 84m q3d and keeping cellcept 750bid  PAST MEDICAL HISTORY   Past Medical History:  Diagnosis Date  . Chronic respiratory failure with hypoxia (HCC)    2L [place] continuous  . COVID-19   . Diabetes mellitus without complication ([facility]   . Diverticulitis   . Follicular lymphoma of intra-abdominal lymph nodes ([facility] 02/15/2019  . ILD (interstitial lung disease) ([facility]      SURGICAL HISTORY   Past Surgical History:  Procedure Laterality Date  . CHOLECYSTECTOMY    . COLON RESECTION     DUE TO DIVERTICULITIS  . FLEXIBLE BRONCHOSCOPY N/A 12/02/2019   Procedure: FLEXIBLE  BRONCHOSCOPY;  Surgeon: AOttie Glazier MD;  Location: [facility];  Service: Thoracic;  Laterality: N/A;  . PORTA CATH INSERTION N/A 03/11/2019   Procedure: PORTA CATH INSERTION;  Surgeon: DAlgernon Huxley MD;  [facility];  Service: Cardiovascular;  Laterality: N/A;  . PORTA CATH REMOVAL Right 02/09/2021   Procedure: PORTA CATH REMOVAL;  Surgeon: SKatha Cabal MD;  [facility];  Service: Cardiovascular;  Laterality: Right;  . PULMONARY THROMBECTOMY N/A 01/29/2019   Procedure: PULMONARY THROMBECTOMY;  Surgeon: DAlgernon Huxley MD;  [facility];  Service: Cardiovascular;  Laterality: N/A;     FAMILY HISTORY   Family History  Problem Relation Age of Onset  . Diabetes Brother      SOCIAL HISTORY   Social History   Tobacco Use  . Smoking status: Never  . Smokeless tobacco: Never  Vaping Use  . Vaping Use: Never used  Substance Use Topics  . Alcohol use: Yes    Alcohol/week: 0.0 - 2.0 standard drinks    Comment: occasional  . Drug use: Never     MEDICATIONS    Home Medication:    Current Medication:  Current Facility-Administered Medications:  .  acetaminophen (TYLENOL) tablet 650 mg, 650 mg, Oral, Q6H PRN, 650 mg at 02/18/21 2344 **OR** acetaminophen (TYLENOL) suppository 650 mg, 650 mg, Rectal, Q6H PRN, PEleonore Chiquito  S, RPH .  ALPRAZolam Duanne Moron) tablet 0.25 mg, 0.25 mg, Oral, QHS PRN, Sharen Hones, MD, 0.25 mg at 02/13/21 2148 .  ALPRAZolam Duanne Moron) tablet 0.25 mg, 0.25 mg, Oral, q morning, Sharen Hones, MD, 0.25 mg at 02/19/21 2482 .  apixaban (ELIQUIS) tablet 5 mg, 5 mg, Oral, BID, Beers, Shanon Brow, RPH, 5 mg at 02/19/21 5003 .  dexamethasone (DECADRON) tablet 6 mg, 6 mg, Oral, Q12H, Fritzi Mandes, MD, 6 mg at 02/19/21 0825 .  digoxin (LANOXIN) tablet 0.125 mg, 0.125 mg, Oral, Daily, Memon, Jolaine Artist, MD, 0.125 mg at 02/18/21 0924 .  diltiazem (CARDIZEM CD) 24 hr capsule 120 mg, 120 mg, Oral, Daily, Aleskerov, Fuad, MD,  120 mg at 02/19/21 7048 .  diltiazem (CARDIZEM) injection 10 mg, 10 mg, Intravenous, Q6H PRN, Wyvonnia Dusky, MD, 10 mg at 01/24/21 2328 .  docusate sodium (COLACE) capsule 200 mg, 200 mg, Oral, BID, Wyvonnia Dusky, MD, 200 mg at 02/19/21 8891 .  feeding supplement (NEPRO CARB STEADY) liquid 237 mL, 237 mL, Oral, TID BM, Memon, Jehanzeb, MD, Last Rate: 0 mL/hr at 01/21/21 2130, 237 mL at 02/19/21 0825 .  glimepiride (AMARYL) tablet 2 mg, 2 mg, Oral, q morning, Fritzi Mandes, MD, 2 mg at 02/19/21 0824 .  guaiFENesin (MUCINEX) 12 hr tablet 1,200 mg, 1,200 mg, Oral, BID, Kathie Dike, MD, 1,200 mg at 02/19/21 6945 .  guaiFENesin-dextromethorphan (ROBITUSSIN DM) 100-10 MG/5ML syrup 5 mL, 5 mL, Oral, Q4H PRN, Kathie Dike, MD, 5 mL at 02/04/21 0850 .  insulin aspart (novoLOG) injection 0-15 Units, 0-15 Units, Subcutaneous, TID WC, Sharen Hones, MD, 3 Units at 02/19/21 [phone] .  insulin aspart (novoLOG) injection 0-5 Units, 0-5 Units, Subcutaneous, QHS, Sharen Hones, MD, 2 Units at 02/17/21 2135 .  insulin aspart (novoLOG) injection 10 Units, 10 Units, Subcutaneous, TID WC, Jennye Boroughs, MD, 10 Units at 02/19/21 [phone]  insulin glargine-yfgn (SEMGLEE) injection 15 Units, 15 Units, Subcutaneous, BID, Jennye Boroughs, MD, 15 Units at 02/19/21 0825 .  ipratropium-albuterol (DUONEB) 0.5-2.5 (3) MG/3ML nebulizer solution 3 mL, 3 mL, Nebulization, TID, Jennye Boroughs, MD, 3 mL at 02/19/21 0349 .  lactulose (CHRONULAC) 10 GM/15ML solution 30 g, 30 g, Oral, BID PRN, Leslye Peer, Richard, MD .  levalbuterol [facility]) nebulizer solution 1.25 mg, 1.25 mg, Nebulization, Q4H PRN, Howerter, Justin B, DO, 1.25 mg at 01/17/21 2011 .  morphine 2 MG/ML injection 1 mg, 1 mg, Intravenous, Q3H PRN, Ottie Glazier, MD, 1 mg at 02/19/21 0157 .  multivitamin with minerals tablet 1 tablet, 1 tablet, Oral, Daily, Kathie Dike, MD, 1 tablet at 02/19/21 1791 .  mycophenolate (CELLCEPT) capsule 750 mg, 750 mg, Oral, BID,  Lanney Gins, Fuad, MD, 750 mg at 02/19/21 0824 .  ondansetron (ZOFRAN) injection 4 mg, 4 mg, Intravenous, Q6H PRN, Stegmayer, Kimberly A, PA-C .  polyethylene glycol (MIRALAX / GLYCOLAX) packet 17 g, 17 g, Oral, Daily, Leslye Peer, Richard, MD, 17 g at 02/17/21 5056 .  simethicone (MYLICON) chewable tablet 80 mg, 80 mg, Oral, QID PRN, Loletha Grayer, MD, 80 mg at 01/29/21 1411 .  sodium chloride (OCEAN) 0.65 % nasal spray 1 spray, 1 spray, Each Nare, PRN, Loletha Grayer, MD, 1 spray at 01/30/21 2142 .  sulfamethoxazole-trimethoprim (BACTRIM) 400-80 MG per tablet 1 tablet, 1 tablet, Oral, Q12H, Ottie Glazier, MD, 1 tablet at 02/19/21 0824 .  traZODone (DESYREL) tablet 50 mg, 50 mg, Oral, QHS PRN, Sharion Settler, NP, 50 mg at 02/18/21 2223    ALLERGIES   Penicillins  REVIEW OF SYSTEMS    Review of Systems:  Gen:  Denies  fever, sweats, chills weigh loss  HEENT: Denies blurred vision, double vision, ear pain, eye pain, hearing loss, nose bleeds, sore throat Cardiac:  No dizziness, chest pain or heaviness, chest tightness,edema Resp:  admits to severe dyspnea Gi: Denies swallowing difficulty, stomach pain, nausea or vomiting, diarrhea, constipation, bowel incontinence Gu:  Denies bladder incontinence, burning urine Ext:   Denies Joint pain, stiffness or swelling Skin: Denies  skin rash, easy bruising or bleeding or hives Endoc:  Denies polyuria, polydipsia , polyphagia or weight change Psych:   admits to axniety  Other:  All other systems negative   VS: BP 100/69 (BP Location: Right Arm)   Pulse 75   Temp 98.1 F (36.7 C) (Oral)   Resp 18   Ht 6' (1.829 m)   Wt 68.9 kg   SpO2 95%   BMI 20.61 kg/m      PHYSICAL EXAM    GENERAL:NAD, no fevers, chills, no weakness no fatigue HEAD: Normocephalic, atraumatic.  EYES: Pupils equal, round, reactive to light. Extraocular muscles intact. No scleral icterus.  MOUTH: Moist mucosal membrane. Dentition intact. No abscess  noted.  EAR, NOSE, THROAT: Clear without exudates. No external lesions.  NECK: Supple. No thyromegaly. No nodules. No JVD.  PULMONARY: Bilateral rhonchi improved from previous CARDIOVASCULAR: S1 and S2. Regular rate and rhythm. No murmurs, rubs, or gallops. No edema. Pedal pulses 2+ bilaterally.  GASTROINTESTINAL: Soft, nontender, nondistended. No masses. Positive bowel sounds. No hepatosplenomegaly.  MUSCULOSKELETAL: No swelling, clubbing, or edema. Range of motion full in all extremities.  NEUROLOGIC: Cranial nerves II through XII are intact. No gross focal neurological deficits. Sensation intact. Reflexes intact.  SKIN: No ulceration, lesions, rashes, or cyanosis. Skin warm and dry. Turgor intact.  PSYCHIATRIC: Mood, affect within normal limits. The patient is awake, alert and oriented x 3. Insight, judgment intact.       IMAGING    CT Chest High Resolution  Result Date: 02/07/2021 CLINICAL DATA:  Inpatient. Interstitial lung disease. Worsening dyspnea. Chronic hypoxemia post COVID-19. History of lymphoma treated with Rituxan. EXAM: CT CHEST WITHOUT CONTRAST TECHNIQUE: Multidetector CT imaging of the chest was performed following the standard protocol without intravenous contrast. High resolution imaging of the lungs, as well as inspiratory and expiratory imaging, was performed. COMPARISON:  02/05/2021 chest radiograph. 01/13/2021 chest CT angiogram. FINDINGS: Cardiovascular: Normal heart size. No significant pericardial effusion/thickening. Right internal jugular central venous catheter terminates at the cavoatrial junction. Normal course and caliber of the thoracic aorta. Stable dilated main pulmonary artery (3.7 cm diameter). Mediastinum/Nodes: No discrete thyroid nodules. Unremarkable esophagus. No pathologically enlarged axillary, mediastinal or hilar lymph nodes, noting limited sensitivity for the detection of hilar adenopathy on this noncontrast study. Lungs/Pleura: No pneumothorax. No  pleural effusion. Severe patchy confluent peribronchovascular and peripheral ground-glass opacity, reticulation, traction bronchiectasis, architectural distortion and volume loss in both lungs with a basilar predominance. The ground-glass opacities have mildly improved since 01/13/2021 chest CT. The bronchiectasis, distortion and volume loss appear slightly worsened. No significant lobular air trapping or evidence of tracheobronchomalacia on the expiration sequence. No acute consolidative airspace disease, lung masses or significant pulmonary nodules. No frank honeycombing. Upper abdomen: Chronic mild elevation of the right hemidiaphragm. Musculoskeletal: No aggressive appearing focal osseous lesions. Mild thoracic spondylosis. IMPRESSION: 1. Spectrum of findings compatible with severe basilar predominant fibrotic interstitial lung disease with ground-glass and no frank honeycombing predominance. Ground-glass opacities have slightly improved and bronchiectasis and other  findings of fibrosis have slightly worsened since recent 01/13/2021 chest CT angiogram study. Findings are most compatible with a severe rapidly progressive postinflammatory fibrosis or fibrotic NSIP pattern. Findings are suggestive of an alternative diagnosis (not UIP) per consensus guidelines: Diagnosis of Idiopathic Pulmonary Fibrosis: An Official ATS/ERS/JRS/ALAT Clinical Practice Guideline. [place], Iss 5, [phone], Dec 31 2016. 2. Stable dilated main pulmonary artery, suggesting chronic pulmonary arterial hypertension. 3. Chronic mild elevation of the right hemidiaphragm. Electronically Signed   By: Ilona Sorrel M.D.   On: 02/07/2021 17:29   PERIPHERAL VASCULAR CATHETERIZATION  Result Date: 02/09/2021 See surgical note for result.  DG Chest Port 1 View  Result Date: 02/18/2021 CLINICAL DATA:  Pulmonary fibrosis EXAM: PORTABLE CHEST 1 VIEW COMPARISON:  02/13/2021, CT 02/07/2021, radiograph 02/05/2021,  07/29/2019 FINDINGS: Low lung volumes. Bilateral bronchiectasis, fibrosis and left greater than right ground-glass opacity without significant interval change. Stable cardiomediastinal silhouette. No pneumothorax. IMPRESSION: Hypoventilatory change without significant interval change in appearance of interstitial lung disease compared with most recent priors. Electronically Signed   By: Donavan Foil M.D.   On: 02/18/2021 15:31   DG Chest Port 1 View  Result Date: 02/13/2021 CLINICAL DATA:  Atelectasis EXAM: PORTABLE CHEST 1 VIEW COMPARISON:  02/05/2021 FINDINGS: Low lung volumes with similar appearance of interstitial lung disease, left greater than right. No pleural effusion. Right chest wall port has been removed. Stable cardiomediastinal contours. IMPRESSION: Interval removal of chest port. Interstitial lung disease with similar lung aeration to prior study. Electronically Signed   By: Macy Mis M.D.   On: 02/13/2021 13:21   DG Chest Port 1 View  Result Date: 02/05/2021 CLINICAL DATA:  Bilateral pulmonary infiltrates on chest x-ray. EXAM: PORTABLE CHEST 1 VIEW COMPARISON:  Chest radiograph 02/01/2021 FINDINGS: Power injectable right IJ port central venous catheter tip projects at the level of the superior cavoatrial junction. Unchanged low lung volumes with lower lobe predominant interstitial opacities, left greater than right. No new focal consolidation, pleural effusion, or pneumothorax. Heart is normal in size. Surgical clips in the right upper abdomen. IMPRESSION: Bilateral interstitial airspace opacities are unchanged compared to 02/01/2021. No new focal consolidation, pleural effusion, or pneumothorax. Electronically Signed   By: Ileana Roup M.D.   On: 02/05/2021 10:59   DG Chest Port 1 View  Result Date: 02/01/2021 CLINICAL DATA:  Shortness of breath, edema EXAM: PORTABLE CHEST 1 VIEW COMPARISON:  01/30/2021 FINDINGS: Stable positioning of right IJ approach Port-A-Cath. Heart size  within normal limits. Low lung volumes. Slight interval worsening of bilateral airspace opacities, left worse than right. No pleural effusion or pneumothorax. IMPRESSION: Slight interval worsening of bilateral airspace opacities, left worse than right. Electronically Signed   By: Davina Poke D.O.   On: 02/01/2021 14:37   DG Chest Port 1 View  Result Date: 01/30/2021 CLINICAL DATA:  Acute on chronic hypoxemic respiratory failure. EXAM: PORTABLE CHEST 1 VIEW COMPARISON:  January 28, 2021 FINDINGS: No pneumothorax. Stable cardiomediastinal silhouette and right Port-A-Cath. Left-greater-than-right pulmonary opacities/infiltrates. No other interval changes. IMPRESSION: 1. Persistent low lung volumes and bilateral pulmonary infiltrates. No interval changes. Electronically Signed   By: Dorise Bullion III M.D.   On: 01/30/2021 08:36   DG Chest Port 1 View  Result Date: 01/28/2021 CLINICAL DATA:  Shortness of breath. EXAM: PORTABLE CHEST 1 VIEW COMPARISON:  Multiple recent chest films. FINDINGS: The right IJ power port is stable. Persistent low lung volumes and bilateral pulmonary infiltrates. No definite pleural effusion or  pneumothorax. IMPRESSION: Persistent low lung volumes and bilateral pulmonary infiltrates. Electronically Signed   By: Marijo Sanes M.D.   On: 01/28/2021 16:03   DG Chest Port 1 View  Result Date: 01/26/2021 CLINICAL DATA:  Shortness of breath, abnormal chest x-ray EXAM: PORTABLE CHEST 1 VIEW COMPARISON:  01/24/2021 FINDINGS: Right Port-A-Cath remains in place, unchanged. Low lung volumes with bilateral airspace opacities, most pronounced in the left perihilar and lower lobe, unchanged since prior study. No visible effusions or pneumothorax. Heart is normal size. IMPRESSION: Low lung volumes with bilateral airspace disease, left-greater-than-right. No significant change. Electronically Signed   By: Rolm Baptise M.D.   On: 01/26/2021 09:42   DG Chest Port 1 View  Result Date:  01/24/2021 CLINICAL DATA:  Follow-up multifocal pneumonia. EXAM: PORTABLE CHEST 1 VIEW COMPARISON:  01/22/2021 and older exams. FINDINGS: Mild interval decrease in the airspace lung opacities, most evident on the left. Lung volumes remain low. No new lung abnormalities. No visualized pleural effusion.  No pneumothorax. Right internal jugular Port-A-Cath is stable. IMPRESSION: 1. Mild interval improvement in lung aeration with decreased airspace lung opacities, most notably on the left. Electronically Signed   By: Lajean Manes M.D.   On: 01/24/2021 07:57   DG Chest Port 1 View  Result Date: 01/22/2021 CLINICAL DATA:  Hypoxia, shortness of breath. EXAM: PORTABLE CHEST 1 VIEW COMPARISON:  January 20, 2021. FINDINGS: Stable cardiomediastinal silhouette. Right internal jugular Port-A-Cath is unchanged in position. Hypoinflation of the lungs is noted. Stable bilateral lung opacities are noted, left greater than right, most consistent with multifocal pneumonia. Bony thorax is unremarkable. IMPRESSION: Hypoinflation of the lungs. Stable bilateral lung opacities are noted, left greater than right, most consistent with multifocal pneumonia. Electronically Signed   By: Marijo Conception M.D.   On: 01/22/2021 19:26           ASSESSMENT/PLAN   Acute on hypoxemic respiratory failure Due to acute exacerbation of Chronic lung disease/ILD with Critical illness Myopathy associated respiratory failure - present on admission  - COVID19 negative   - supplemental O2 during my evaluation 10l/min>>8L>>HFNC 7L -Respiratory viral panel-negative  -serum fungitell-negative  -legionella ab-negative  -nasal MRSA PCR-negative -Procalcitonin trend reviewed -strep pneumoniae ur AG -Histoplasma Ur Ag-negative -sputum resp cultures -AFB sputum expectorated specimen -sputum cytology  -completed  Cefepime course for cap -Reduced Solumedrol to 40 BID - 01/15/21>>50 BID>>40 BID>>30 BID>>20 BID>>pred 50>>soluemdrol 40 iv bid  please watch surgars -reviewed pertinent imaging with patient today - ESR and CRP are elevated but improving -PT/OT for d/c planning  -please encourage patient to use incentive spirometer few times each hour while hospitalized.   -continue Cellcept 250 bid >>500 bid >>750BID -UOP is adequate -Discussed goal of care - recommend DNR patient is in agreement but still discussing with wife, palliative care service to follow  -lasix 20 daily PO     Thank you for allowing me to participate in the care of this patient.   Patient/Family are satisfied with care plan and all questions have been answered.   This document was prepared using Dragon voice recognition software and may include unintentional dictation errors.     Ottie Glazier, M.D.  Division of [place]

## 2021-02-19 NOTE — Discharge Summary (Signed)
[place] [facility] [place] NAME: Edward Atkinson    MR#:  [id number]  DATE OF BIRTH:  January 29, 1961  DATE OF ADMISSION:  01/13/2021 ADMITTING PHYSICIAN: Edward Mura, DO  DATE OF DISCHARGE: 02/19/2021  PRIMARY CARE PHYSICIAN: Edward Aus, MD    ADMISSION DIAGNOSIS:  Shortness of breath [R06.02] Hypoxia [R09.02] Acute on chronic respiratory failure with hypoxia (HCC) [J96.21]  DISCHARGE DIAGNOSIS:  Acute on chronic hypoxic respiratory failure secondary to significant interstitial lung disease restrictive lung disease severe/chronic bronchiectasis SECONDARY DIAGNOSIS:   Past Medical History:  Diagnosis Date  . Chronic respiratory failure with hypoxia (HCC)    2L [place] continuous  . COVID-19   . Diabetes mellitus without complication ([place])   . Diverticulitis   . Follicular lymphoma of intra-abdominal lymph nodes ([place]) 02/15/2019  . ILD (interstitial lung disease) [facility])     HOSPITAL COURSE:  Edward Atkinson is a 60 y.o. male with history of follicular lymphoma, [id number] infection, interstitial lung disease and chronic respiratory failure, and type 2 diabetes mellitus, who presented to the hospital with respiratory distress and acute hypoxemic respiratory failure with oxygen saturation of 49% on room air.  Pt was admitted with severe sepsis secondary to committee acquired pneumonia and interstitial lung disease exacerbation complicated by acute on chronic hypoxemic respiratory failure.     Acute on chronic hypoxemic respiratory failure, exacerbation of interstitial lung disease/Restrictive lung dz/ bronchiectasis:  --pt remains on oral dexamethasone 6 mg bid.  Follow-up with pulmonologist for further recommendations. Taper by 1 mg BID every three days --cont to maintain sats >88%. Currently on 3-5 L [place]/min with increase requirement during exertion. -- Continue use of flutter valve and incentive spirometer. -- on Cellcept per Edward Atkinson  (steroid sparing regimen) --cont metaneb -- NIV/Trilogy and oxygen, for home  Type II DM with severe hyperglycemia, hypoglycemia: -- He had hypoglycemia (glucose level 46) on the evening of 02/15/2021.   --now on insulin glargine to 15 units twice daily   --Monitor glucose levels closely and adjust insulin therapy accordingly. -- Add glimipride (home med) -- discontinue metformin -- patient to keep log of sugars at home and discuss with Edward Atkinson further management as outpatient. Patient may be able to come off insulin once his steroid taper  Paroxysmal atrial fibrillation: Continue Cardizem and Eliquis  Follicular lymphoma: Completed chemotherapy.  S/p removal of Port-A-Cath on 02/09/2021.   Immunocompromised state: He is on Bactrim for PCP prophylaxis     Family communication :wife at bedside Consults :Pulmonary CODE STATUS: FULL DVT Prophylaxis :lovenox Level of care: Progressive Cardiac Status is: Inpatient  Pt's goal is to go home on today with HHPT,oxygen and Trilogy. Discussed with patient and wife agreeable. Discussed with pulmonary agreeable will discharge plan and outpatient follow-up with PCP and Edward Atkinson    CONSULTS OBTAINED:  Treatment Team:  Edward Glazier, MD  DRUG ALLERGIES:   Allergies  Allergen Reactions  . Penicillins Rash    Did it involve swelling of the face/tongue/throat, SOB, or low BP? No Did it involve sudden or severe rash/hives, skin peeling, or any reaction on the inside of your mouth or nose? No Did you need to seek medical attention at a hospital or doctor's office? No When did it last happen?      Unknown If all above answers are "NO", may proceed with cephalosporin use.    DISCHARGE MEDICATIONS:   Allergies as of 02/19/2021       Reactions  Penicillins Rash   Did it involve swelling of the face/tongue/throat, SOB, or low BP? No Did it involve sudden or severe rash/hives, skin peeling, or any reaction on the inside of your mouth or  nose? No Did you need to seek medical attention at a hospital or doctor's office? No When did it last happen?      Unknown If all above answers are "NO", may proceed with cephalosporin use.        Medication List     STOP taking these medications    acyclovir 400 MG tablet Commonly known as: ZOVIRAX   lidocaine-prilocaine cream Commonly known as: EMLA   metFORMIN 500 MG tablet Commonly known as: GLUCOPHAGE       TAKE these medications    albuterol (2.5 MG/3ML) 0.083% nebulizer solution Commonly known as: PROVENTIL Take 3 mLs (2.5 mg total) by nebulization every 4 (four) hours as needed for wheezing or shortness of breath. What changed:  how much to take how to take this when to take this reasons to take this Another medication with the same name was removed. Continue taking this medication, and follow the directions you see here.   ALPRAZolam 0.25 MG tablet Commonly known as: XANAX Take 1 tablet (0.25 mg total) by mouth 2 (two) times daily as needed for anxiety.   apixaban 5 MG Tabs tablet Commonly known as: ELIQUIS Take 1 tablet (5 mg total) by mouth 2 (two) times daily. What changed:  medication strength how much to take   SunGard 160-9-4.8 MCG/ACT Aero Generic drug: Budeson-Glycopyrrol-Formoterol TWO INHALATIONS INTO LUNGS TWICE DAILY   budesonide 1 MG/2ML nebulizer solution Commonly known as: PULMICORT Take 2 mLs (1 mg total) by nebulization daily. What changed:  how much to take how to take this when to take this   Combivent Respimat 20-100 MCG/ACT Aers respimat Generic drug: Ipratropium-Albuterol   dexamethasone 1 MG tablet Commonly known as: DECADRON Take 6 tablets (6 mg total) by mouth every 12 (twelve) hours. Take 6 mg bid and taper by 1 mg bid every 3 days   dextromethorphan-guaiFENesin 30-600 MG 12hr tablet Commonly known as: MUCINEX DM Take 1 tablet by mouth 2 (two) times daily as needed for cough.   diltiazem 120 MG 24 hr  capsule Commonly known as: CARDIZEM CD Take 1 capsule (120 mg total) by mouth daily. Start taking on: February 20, 2021   glimepiride 2 MG tablet Commonly known as: AMARYL Take 2 mg by mouth every morning.   insulin glargine-yfgn 100 UNIT/ML injection Commonly known as: SEMGLEE Inject 0.15 mLs (15 Units total) into the skin 2 (two) times daily.   montelukast 10 MG tablet Commonly known as: SINGULAIR Take 10 mg by mouth at bedtime.   multivitamin with minerals Tabs tablet Take 1 tablet by mouth daily. Start taking on: February 20, 2021   mycophenolate 250 MG capsule Commonly known as: CELLCEPT Take 3 capsules (750 mg total) by mouth 2 (two) times daily.   prochlorperazine 10 MG tablet Commonly known as: COMPAZINE Take 1 tablet (10 mg total) by mouth every 6 (six) hours as needed (Nausea or vomiting).   sulfamethoxazole-trimethoprim 400-80 MG tablet Commonly known as: BACTRIM Take 1 tablet by mouth every 12 (twelve) hours.               Durable Medical Equipment  (From admission, onward)           Start     Ordered   02/18/21 0852  For home use only  DME oxygen  Once       Question Answer Comment  Length of Need Lifetime   Mode or (Route) Nasal cannula   Liters per Minute 5   Frequency Continuous (stationary and portable oxygen unit needed)   Oxygen conserving device Yes   Oxygen delivery system Gas      02/18/21 0852   02/18/21 0852  For home use only DME Bedside commode  Once       Question:  Patient needs a bedside commode to treat with the following condition  Answer:  General weakness   02/18/21 0852   02/18/21 0852  For home use only DME Walker rolling  Once       Question Answer Comment  Walker: With [place]   Patient needs a walker to treat with the following condition General weakness      02/18/21 0852            If you experience worsening of your admission symptoms, develop shortness of breath, life threatening emergency,  suicidal or homicidal thoughts you must seek medical attention immediately by calling 911 or calling your MD immediately  if symptoms less severe.  You Must read complete instructions/literature along with all the possible adverse reactions/side effects for all the Medicines you take and that have been prescribed to you. Take any new Medicines after you have completely understood and accept all the possible adverse reactions/side effects.   Please note  You were cared for by a hospitalist during your hospital stay. If you have any questions about your discharge medications or the care you received while you were in the hospital after you are discharged, you can call the unit and asked to speak with the hospitalist on call if the hospitalist that took care of you is not available. Once you are discharged, your primary care physician will handle any further medical issues. Please note that NO REFILLS for any discharge medications will be authorized once you are discharged, as it is imperative that you return to your primary care physician (or establish a relationship with a primary care physician if you do not have one) for your aftercare needs so that they can reassess your need for medications and monitor your lab values. Today   SUBJECTIVE   I am ready to go home  VITAL SIGNS:  Blood pressure 100/69, pulse 75, temperature 98.1 F (36.7 C), temperature source Oral, resp. rate 18, height 6' (1.829 m), weight 68.9 kg, SpO2 95 %.  I/O:   Intake/Output Summary (Last 24 hours) at 02/19/2021 0848 Last data filed at 02/19/2021 0754 Gross per 24 hour  Intake 1200 ml  Output 2170 ml  Net -970 ml    PHYSICAL EXAMINATION:  GENERAL:  60 y.o.-year-old patient lying in the bed with no acute distress.  LUNGS: distant breath sounds bilaterally, no wheezing, rales, rhonchi. No use of accessory muscles of respiration.  CARDIOVASCULAR: S1, S2 normal. No murmurs, rubs, or gallops.  ABDOMEN: Soft,  nontender, nondistended. Bowel sounds present. No organomegaly or mass.  EXTREMITIES: No cyanosis, clubbing or edema b/l.    NEUROLOGIC: Cranial nerves II through XII are intact. No focal Motor or sensory deficits b/l.  weak PSYCHIATRIC:  patient is alert and oriented x 3.  SKIN: No obvious rash, lesion, or ulcer. DATA REVIEW:   CBC  Recent Labs  Lab 02/13/21 0623  WBC 5.8  HGB 11.5*  HCT 33.3*  PLT 238    Chemistries  No results for input(s): NA,  K, CL, CO2, GLUCOSE, BUN, CREATININE, CALCIUM, MG, AST, ALT, ALKPHOS, BILITOT in the last 168 hours.  Invalid input(s): [place]  Microbiology Results   No results found for this or any previous visit (from the past 240 hour(s)).  RADIOLOGY:  DG Chest Port 1 View  Result Date: 02/18/2021 CLINICAL DATA:  Pulmonary fibrosis EXAM: PORTABLE CHEST 1 VIEW COMPARISON:  02/13/2021, CT 02/07/2021, radiograph 02/05/2021, 07/29/2019 FINDINGS: Low lung volumes. Bilateral bronchiectasis, fibrosis and left greater than right ground-glass opacity without significant interval change. Stable cardiomediastinal silhouette. No pneumothorax. IMPRESSION: Hypoventilatory change without significant interval change in appearance of interstitial lung disease compared with most recent priors. Electronically Signed   By: Donavan Foil M.D.   On: 02/18/2021 15:31     CODE STATUS:     Code Status Orders  (From admission, onward)           Start     Ordered   01/13/21 1831  Full code  Continuous        01/13/21 1831           Code Status History     Date Active Date Inactive Code Status Order ID Comments User Context   07/29/2019 1447 08/01/2019 1954 Full Code [id number]  Ivor Costa, MD ED   01/29/2019 1601 02/01/2019 1733 Full Code [id number]  Nicholes Mango, MD ED      Advance Directive Documentation    Flowsheet Row Most Recent Value  Type of Advance Directive Living will, Healthcare Power of Attorney  Pre-existing out of facility DNR order (yellow  form or pink MOST form) --  "MOST" Form in Place? --        TOTAL TIME TAKING CARE OF THIS PATIENT: 40 minutes.    Fritzi Mandes M.D  [facility]    CC: Primary care physician; Edward Aus, MD

## 2021-02-22 ENCOUNTER — Ambulatory Visit
Admission: RE | Admit: 2021-02-22 | Discharge: 2021-02-22 | Disposition: A | Discharge status: Home / Self Care | Payer: BC Managed Care – PPO | Source: Ambulatory Visit | Attending: Oncology | Admitting: Oncology

## 2021-03-10 ENCOUNTER — Emergency Department
Admission: EM | Admit: 2021-03-10 | Discharge: 2021-04-01 | Disposition: E | Payer: BC Managed Care – PPO | Attending: Emergency Medicine | Admitting: Emergency Medicine

## 2021-03-10 DIAGNOSIS — Z79899 Other long term (current) drug therapy: Secondary | ICD-10-CM | POA: Insufficient documentation

## 2021-03-10 DIAGNOSIS — Z7901 Long term (current) use of anticoagulants: Secondary | ICD-10-CM | POA: Diagnosis not present

## 2021-03-10 DIAGNOSIS — Z794 Long term (current) use of insulin: Secondary | ICD-10-CM | POA: Insufficient documentation

## 2021-03-10 DIAGNOSIS — E119 Type 2 diabetes mellitus without complications: Secondary | ICD-10-CM | POA: Insufficient documentation

## 2021-03-10 DIAGNOSIS — Z8616 Personal history of COVID-19: Secondary | ICD-10-CM | POA: Diagnosis not present

## 2021-03-10 DIAGNOSIS — Z7984 Long term (current) use of oral hypoglycemic drugs: Secondary | ICD-10-CM | POA: Diagnosis not present

## 2021-03-10 DIAGNOSIS — I469 Cardiac arrest, cause unspecified: Secondary | ICD-10-CM | POA: Insufficient documentation

## 2021-03-10 LAB — CBC WITH DIFFERENTIAL/PLATELET
Abs Immature Granulocytes: 0.84 10*3/uL — ABNORMAL HIGH (ref 0.00–0.07)
Basophils Absolute: 0.1 10*3/uL (ref 0.0–0.1)
Basophils Relative: 1 %
Eosinophils Absolute: 0 10*3/uL (ref 0.0–0.5)
Eosinophils Relative: 0 %
HCT: 26.7 % — ABNORMAL LOW (ref 39.0–52.0)
Hemoglobin: 8.1 g/dL — ABNORMAL LOW (ref 13.0–17.0)
Immature Granulocytes: 11 %
Lymphocytes Relative: 42 %
Lymphs Abs: 3.4 10*3/uL (ref 0.7–4.0)
MCH: 30.7 pg (ref 26.0–34.0)
MCHC: 30.3 g/dL (ref 30.0–36.0)
MCV: 101.1 fL — ABNORMAL HIGH (ref 80.0–100.0)
Monocytes Absolute: 0.5 10*3/uL (ref 0.1–1.0)
Monocytes Relative: 6 %
Neutro Abs: 3.2 10*3/uL (ref 1.7–7.7)
Neutrophils Relative %: 40 %
Platelets: 66 10*3/uL — ABNORMAL LOW (ref 150–400)
RBC: 2.64 MIL/uL — ABNORMAL LOW (ref 4.22–5.81)
RDW: 19.5 % — ABNORMAL HIGH (ref 11.5–15.5)
Smear Review: DECREASED
WBC Morphology: ABNORMAL
WBC: 8 10*3/uL (ref 4.0–10.5)
nRBC: 5.3 % — ABNORMAL HIGH (ref 0.0–0.2)

## 2021-03-10 LAB — LACTIC ACID, PLASMA: Lactic Acid, Venous: >9 mmol/L (ref 0.5–1.9)

## 2021-03-10 LAB — COMPREHENSIVE METABOLIC PANEL
ALT: 3166 U/L — ABNORMAL HIGH (ref 0–44)
AST: 1891 U/L — ABNORMAL HIGH (ref 15–41)
Albumin: <1.5 g/dL — ABNORMAL LOW (ref 3.5–5.0)
Alkaline Phosphatase: 114 U/L (ref 38–126)
Anion gap: 19 — ABNORMAL HIGH (ref 5–15)
BUN: 15 mg/dL (ref 8–23)
CO2: 14 mmol/L — ABNORMAL LOW (ref 22–32)
Calcium: 6.6 mg/dL — ABNORMAL LOW (ref 8.9–10.3)
Chloride: 101 mmol/L (ref 98–111)
Creatinine, Ser: 0.66 mg/dL (ref 0.61–1.24)
GFR, Estimated: >60 mL/min (ref >60)
Glucose, Bld: 199 mg/dL — ABNORMAL HIGH (ref 70–99)
Potassium: 6.3 mmol/L (ref 3.5–5.1)
Sodium: 134 mmol/L — ABNORMAL LOW (ref 135–145)
Total Bilirubin: 0.7 mg/dL (ref 0.3–1.2)
Total Protein: <3 g/dL — ABNORMAL LOW (ref 6.5–8.1)

## 2021-03-10 LAB — CBG MONITORING, ED
Glucose-Capillary: 43 mg/dL — CL (ref 70–99)
Glucose-Capillary: 215 mg/dL — ABNORMAL HIGH (ref 70–99)

## 2021-03-10 MED ORDER — EPINEPHRINE 1 MG/10ML IJ SOSY
PREFILLED_SYRINGE | INTRAMUSCULAR | Status: AC | PRN
  Administered 2021-03-10 (×3): 1 mg via INTRAVENOUS

## 2021-03-10 MED ORDER — DOPAMINE-DEXTROSE 3.2-5 MG/ML-% IV SOLN
INTRAVENOUS | Status: AC | PRN
  Administered 2021-03-10: 4.335 ug/kg/min via INTRAVENOUS

## 2021-03-10 MED ORDER — DEXTROSE 50 % IV SOLN
INTRAVENOUS | Status: AC | PRN
  Administered 2021-03-10: 1 via INTRAVENOUS

## 2021-03-10 MED ORDER — EPINEPHRINE 1 MG/10ML IJ SOSY
PREFILLED_SYRINGE | INTRAMUSCULAR | Status: AC | PRN
  Administered 2021-03-10 (×2): 1 mg via INTRAVENOUS

## 2021-03-10 MED ORDER — NOREPINEPHRINE 4 MG/250ML-% IV SOLN
INTRAVENOUS | Status: AC | PRN
  Administered 2021-03-10: 20 ug/min via INTRAVENOUS

## 2021-04-01 NOTE — Code Documentation (Signed)
Bradycardia with weak pulse

## 2021-04-01 NOTE — Code Documentation (Signed)
CPR in progress. 

## 2021-04-01 NOTE — ED Provider Notes (Addendum)
[facility]  ____________________________________________   Event Date/Time   First MD Initiated Contact with Patient 03-21-2021 1758     (approximate)  I have reviewed the triage vital signs and the nursing notes.   HISTORY  Chief Complaint Cardiac Arrest    HPI Edward Atkinson is a 60 y.o. male with past medical history of interstitial lung disease post COVID, on 10 L nasal cannula, diabetes, follicular lymphoma who presents in cardiac arrest.  Per EMS they were called out for difficulty breathing and blood sugar problems.  On arrival the patient was significantly hypoxic and in respiratory distress.  Was initially on 25 L nasal cannula satting in the 80s.  Was transferred to CPAP.  During transport patient lost pulses was intubated with a King airway and CPR was started.  He received 3 rounds of epinephrine.         Past Medical History:  Diagnosis Date   Chronic respiratory failure with hypoxia (HCC)    2L [place] continuous   COVID-19    Diabetes mellitus without complication ([place])    Diverticulitis    Follicular lymphoma of intra-abdominal lymph nodes ([place]) 02/15/2019   ILD (interstitial lung disease) [facility])     Patient Active Problem List   Diagnosis Date Noted   Uncontrolled type 2 diabetes mellitus with hyperglycemia ([place]) 02/07/2021   Hyponatremia 02/03/2021   Pressure injury of skin 02/02/2021   Leukocytosis    AF (paroxysmal atrial fibrillation) (HCC)    Thrombocytosis    Protein-calorie malnutrition, severe 01/15/2021   Severe sepsis ([place]) 01/14/2021   CAP (community acquired pneumonia) 01/14/2021   Elevated troponin 01/14/2021   Protein calorie malnutrition ([place]) 01/14/2021   Acute on chronic respiratory failure with hypoxia ([place]) 01/13/2021   Encounter for monoclonal antibody treatment for malignancy 07/02/2020   Interstitial lung disease ([place]) 07/02/2020   Lymphocytopenia    SOB (shortness of breath)    Sepsis ([place]) 07/29/2019    Thrombocytopenia ([place]) 07/29/2019   History of DVT (deep vein thrombosis) 07/06/2019   Transaminitis 07/06/2019   History of pulmonary embolism 07/06/2019   Encounter for antineoplastic chemotherapy 04/29/2019   Pulmonary embolus ([place]) 09/32/6712   Follicular lymphoma ([place]) 02/15/2019   Goals of care, counseling/discussion 02/15/2019   DVT (deep venous thrombosis) ([place]) 02/12/2019   Prostatitis 45/80/9983   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[place]) 02/06/2019   Other pulmonary embolism with acute cor pulmonale ([place]) 02/06/2019   Type 2 diabetes mellitus with hypoglycemia without coma, without long-term current use of insulin ([place]) 02/06/2019   Massive pulmonary hemorrhage originating in perinatal period 01/29/2019    Past Surgical History:  Procedure Laterality Date   CHOLECYSTECTOMY     COLON RESECTION     DUE TO DIVERTICULITIS   FLEXIBLE BRONCHOSCOPY N/A 12/02/2019   Procedure: FLEXIBLE BRONCHOSCOPY;  Surgeon: Ottie Glazier, MD;  Location: [facility] ORS;  Service: Thoracic;  Laterality: N/A;   PORTA CATH INSERTION N/A 03/11/2019   Procedure: PORTA CATH INSERTION;  Surgeon: Algernon Huxley, MD;  Location: [facility];  Service: Cardiovascular;  Laterality: N/A;   PORTA CATH REMOVAL Right 02/09/2021   Procedure: PORTA CATH REMOVAL;  Surgeon: Katha Cabal, MD;  Location: [place];  Service: Cardiovascular;  Laterality: Right;   PULMONARY THROMBECTOMY N/A 01/29/2019   Procedure: PULMONARY THROMBECTOMY;  Surgeon: Algernon Huxley, MD;  Location: [facility];  Service: Cardiovascular;  Laterality: N/A;    Prior to Admission medications   Medication Sig  Start Date End Date Taking? Authorizing Provider  albuterol (PROVENTIL) (2.5 MG/3ML) 0.083% nebulizer solution Take 3 mLs (2.5 mg total) by nebulization every 4 (four) hours as needed for wheezing or shortness of breath. 02/19/21 02/19/22  Fritzi Mandes, MD  ALPRAZolam Duanne Moron) 0.25 MG tablet Take 1 tablet (0.25 mg  total) by mouth 2 (two) times daily as needed for anxiety. 02/19/21   Fritzi Mandes, MD  apixaban (ELIQUIS) 5 MG TABS tablet Take 1 tablet (5 mg total) by mouth 2 (two) times daily. 02/19/21   Fritzi Mandes, MD  BREZTRI AEROSPHERE 160-9-4.8 MCG/ACT AERO TWO INHALATIONS INTO LUNGS TWICE DAILY 12/24/19   [provider]  budesonide (PULMICORT) 1 MG/2ML nebulizer solution Take 2 mLs (1 mg total) by nebulization daily. 02/19/21 03/21/21  Fritzi Mandes, MD  COMBIVENT RESPIMAT 20-100 MCG/ACT AERS respimat  10/08/20   [provider]  dexamethasone (DECADRON) 1 MG tablet Take 6 tablets (6 mg total) by mouth every 12 (twelve) hours. Take 6 mg bid and taper by 1 mg bid every 3 days 02/19/21   Fritzi Mandes, MD  dextromethorphan-guaiFENesin [facility] DM) 30-600 MG 12hr tablet Take 1 tablet by mouth 2 (two) times daily as needed for cough.     [provider]  diltiazem (CARDIZEM CD) 120 MG 24 hr capsule Take 1 capsule (120 mg total) by mouth daily. 02/20/21   Fritzi Mandes, MD  glimepiride (AMARYL) 2 MG tablet Take 2 mg by mouth every morning. 11/30/20   [provider]  insulin glargine-yfgn (SEMGLEE) 100 UNIT/ML injection Inject 0.15 mLs (15 Units total) into the skin 2 (two) times daily. 02/19/21   Fritzi Mandes, MD  montelukast (SINGULAIR) 10 MG tablet Take 10 mg by mouth at bedtime.  09/13/19 09/12/21  [provider]  Multiple Vitamin (MULTIVITAMIN WITH MINERALS) TABS tablet Take 1 tablet by mouth daily. 02/20/21   Fritzi Mandes, MD  mycophenolate (CELLCEPT) 250 MG capsule Take 3 capsules (750 mg total) by mouth 2 (two) times daily. 02/19/21   Fritzi Mandes, MD  prochlorperazine (COMPAZINE) 10 MG tablet Take 1 tablet (10 mg total) by mouth every 6 (six) hours as needed (Nausea or vomiting). 02/15/19   Earlie Server, MD  sulfamethoxazole-trimethoprim (BACTRIM) 400-80 MG tablet Take 1 tablet by mouth every 12 (twelve) hours. 02/19/21   Fritzi Mandes, MD    Allergies Penicillins  Family  History  Problem Relation Age of Onset   Diabetes Brother     Social History Social History   Tobacco Use   Smoking status: Never   Smokeless tobacco: Never  Vaping Use   Vaping Use: Never used  Substance Use Topics   Alcohol use: Yes    Alcohol/week: 0.0 - 2.0 standard drinks    Comment: occasional   Drug use: Never    Review of Systems   Review of Systems  Unable to perform ROS: Acuity of condition   Physical Exam Updated Vital Signs BP (!) 44/26   Pulse (!) 25   Physical Exam Constitutional:      Comments: Patient is chronically ill-appearing, unconscious in cardiac arrest  HENT:     Head: Normocephalic and atraumatic.  Eyes:     Comments: Pupils are fixed and dilated  Cardiovascular:     Comments: No pulse Pulmonary:     Comments: Airway in place, mechanical breath sounds bilaterally Abdominal:     General: There is no distension.     Palpations: Abdomen is soft.  Genitourinary:    Comments: Ulceration on the penis,  no crepitus or erythema Musculoskeletal:     Right lower leg: No edema.     Left lower leg: No edema.  Skin:    Comments: Cold skin  Neurological:     Comments: Patient is unconscious GCS 3     LABS (all labs ordered are listed, but only abnormal results are displayed)  Labs Reviewed  CBG MONITORING, ED - Abnormal; Notable for the following components:      Result Value   Glucose-Capillary 43 (*)    All other components within normal limits  CBG MONITORING, ED - Abnormal; Notable for the following components:   Glucose-Capillary 215 (*)    All other components within normal limits  CBC WITH DIFFERENTIAL/PLATELET  COMPREHENSIVE METABOLIC PANEL  LACTIC ACID, PLASMA  LACTIC ACID, PLASMA  BLOOD GAS, VENOUS   ____________________________________________  EKG  N/a ____________________________________________  RADIOLOGY Almeta Monas, personally viewed and evaluated these images (plain radiographs) as part of my medical  decision making, as well as reviewing the written report by the radiologist.  ED MD interpretation:  n/a    ____________________________________________   PROCEDURES  Procedure(s) performed (including Critical Care):  .Critical Care Performed by: Rada Hay, MD Authorized by: Rada Hay, MD   Critical care provider statement:    Critical care time (minutes):  30   Critical care was necessary to treat or prevent imminent or life-threatening deterioration of the following conditions:  Cardiac failure   Critical care was time spent personally by me on the following activities:  Examination of patient, development of treatment plan with patient or surrogate, ordering and performing treatments and interventions, ordering and review of laboratory studies and ordering and review of radiographic studies Procedure Name: Intubation Date/Time: Apr 05, 2021 8:35 PM Performed by: Rada Hay, MD Pre-anesthesia Checklist: Patient identified Oxygen Delivery Method: Ambu bag Laryngoscope Size: Glidescope Grade View: Grade I Tube size: 7.5 mm Number of attempts: 1 Placement Confirmation: ETT inserted through vocal cords under direct vision and CO2 detector Secured at: 23 cm Tube secured with: ETT holder      ____________________________________________   INITIAL IMPRESSION / [place] / ED COURSE     Patient is a 60 year old male who presents in PEA arrest.  It was a witnessed arrest, EMS called for respiratory distress and found the patient significantly hypoxic satting in the 70s on 25 L nasal cannula.  Initially placed on CPAP but then pulses loss.  On arrival to the ED he has a King airway in place.  He was intubated with an ET tube on arrival.  Sats in the 90s.  Positive color change for end-tidal.  He was given multiple additional rounds of epinephrine.  During pulse checks there was a very bradycardic agonal rhythm was not perfusing.  He was started on  dopamine and nor epi drips in addition to the pushes of epinephrine's.  Also given fluid bolus given the concern for sepsis with fever per EMS.  After about 35 minutes of ACLS, patient did not have ROSC started to have bleeding from his ET tube.  Given his significant premorbid condition very unlikely to have any meaningful outcome.  Discussed with the family.  Time of death was called at 5:57 PM.      ____________________________________________   FINAL CLINICAL IMPRESSION(S) / ED DIAGNOSES  Final diagnoses:  Cardiac arrest [facility])     ED Discharge Orders     None        Note:  This document was  prepared using Systems analyst and may include unintentional dictation errors.    Rada Hay, MD 03-27-21 1805    Rada Hay, MD 03-27-21 2035

## 2021-04-01 NOTE — Progress Notes (Signed)
Carman Ching met family in ED family consult room. Hospitality, prayer, and grief support was central to this visit. Strong family bond was evident. The family expressed appreciate and gratitude for the efforts of staff.

## 2021-04-01 NOTE — ED Notes (Signed)
Funeral home  ([employer] [place])personnel arrived at the bedside. Funeral Home release form completed and provided to the personnel.

## 2021-04-01 NOTE — Code Documentation (Signed)
Family updated as to patient's status.  Dr. Starleen Blue speaking with/ updating family

## 2021-04-01 NOTE — Code Documentation (Signed)
Pulse check:  pea with agonal ekg .  Ultra sound checked for cardiac activity, none seen.  Pt pronounced by Dr. Starleen Blue.

## 2021-04-01 NOTE — ED Triage Notes (Signed)
Arrives via EMS -- CPR inprogress.   CBG:  43   PEA per EMS report.

## 2021-04-01 NOTE — Code Documentation (Signed)
PEA.

## 2021-04-01 NOTE — ED Triage Notes (Signed)
Arrives via [facility].  EMS responded to call for SOB.  Patient arrives receiving CPR.  King airway in place. Ventilations via BVM.

## 2021-04-01 NOTE — Code Documentation (Signed)
Patient time of death occurred at 83.

## 2021-04-01 DEATH — deceased

## 2021-04-13 IMAGING — CT CT CHEST-ABD-PELV W/ CM
2 of 5 series · 13 of 36 positions shown, 15 images · IV contrast (omnipaque)
Comparison: No sign of aortic dilation indicating aneurysm.

CLINICAL DATA: Follicular cell lymphoma follow-up n/a 60-year-old
male.

EXAM:
CT CHEST, ABDOMEN, AND PELVIS WITH CONTRAST
TECHNIQUE: Multidetector CT imaging of the chest, abdomen and pelvis was
performed following the standard protocol during bolus
administration of intravenous contrast.
CONTRAST:  75mL OMNIPAQUE IOHEXOL 300 MG/ML  SOLN

[Series 3: axials cap 5.00 · axial · 0.81mm/px · z∈[-1582,-1007]mm · 10 of 141 slices shown, 12 images]
[im 13/141  mediastinal]
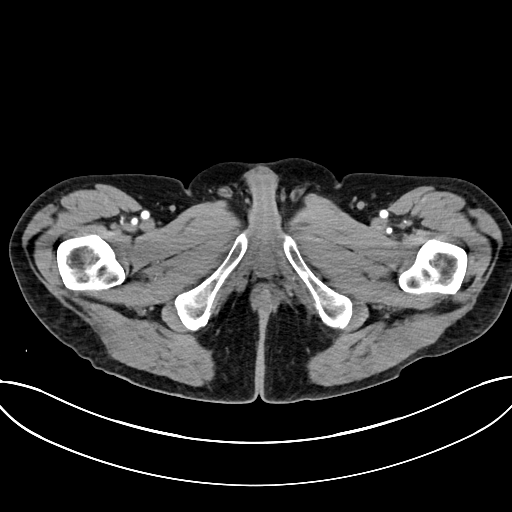
[im 13/141  bone]
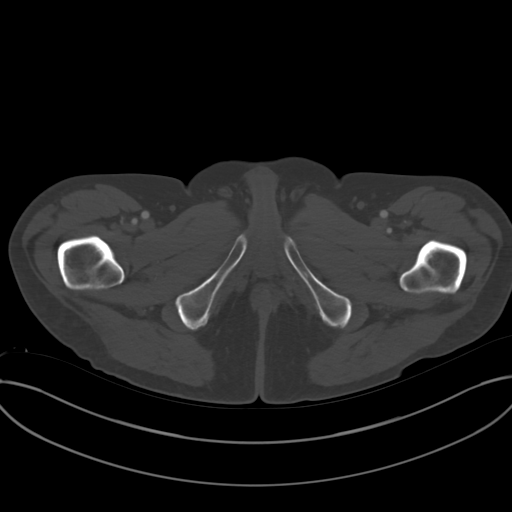
[im 26/141  mediastinal]
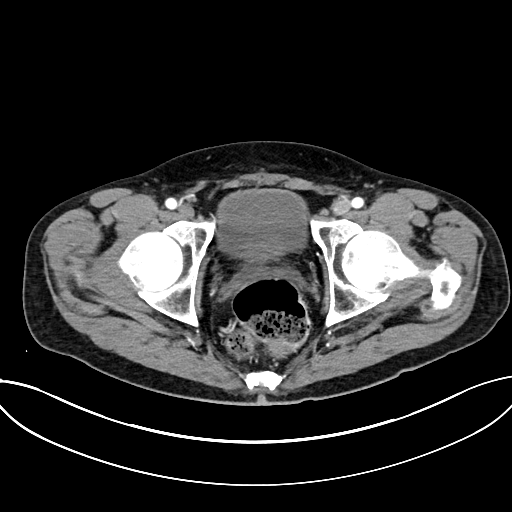
[im 39/141  mediastinal]
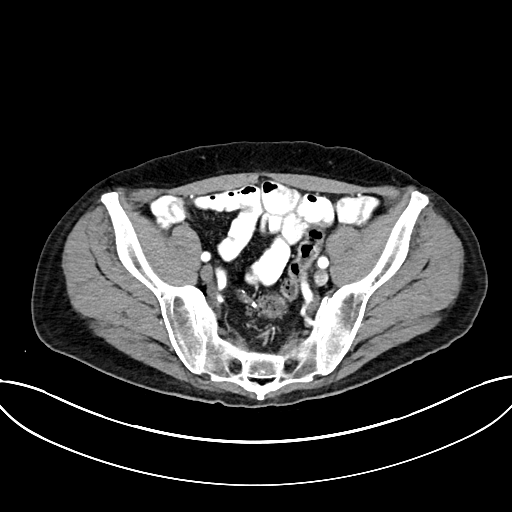
[im 51/141  mediastinal]
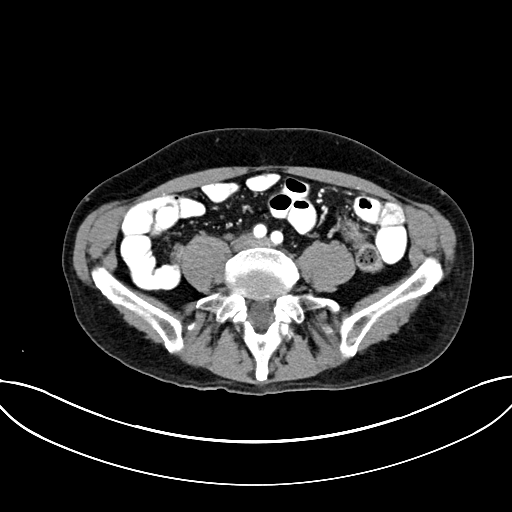
[im 64/141  mediastinal]
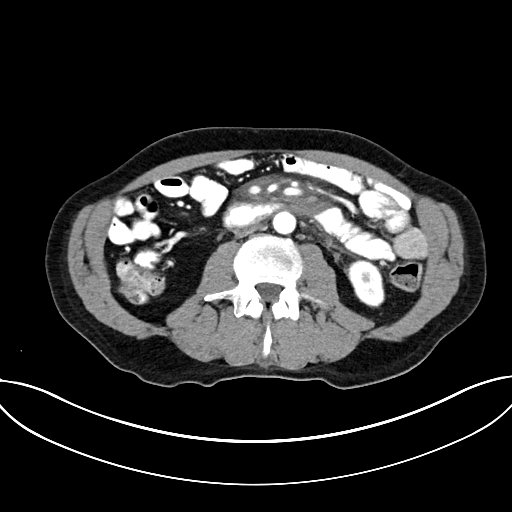
[im 77/141  mediastinal]
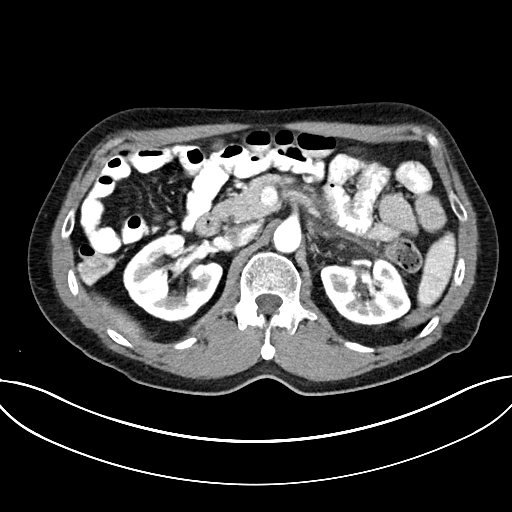
[im 90/141  mediastinal]
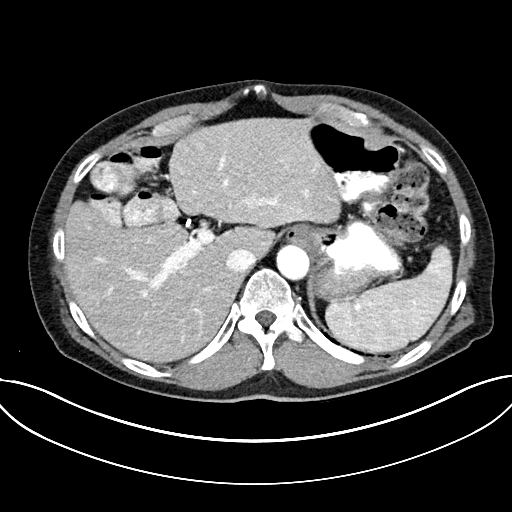
[im 102/141  mediastinal]
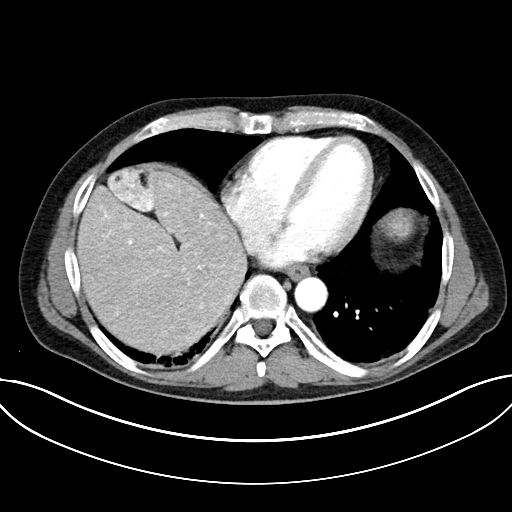
[im 115/141  mediastinal]
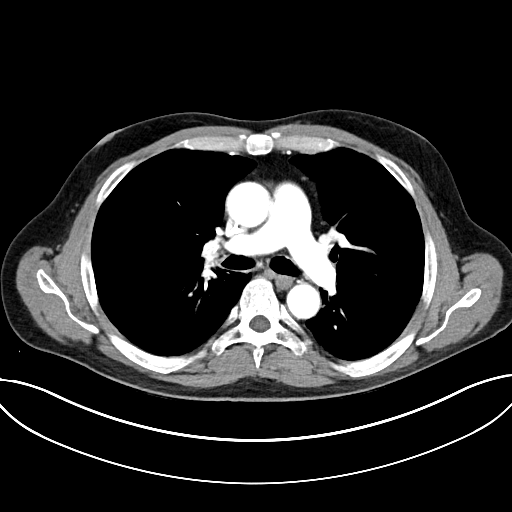
[im 115/141  bone]
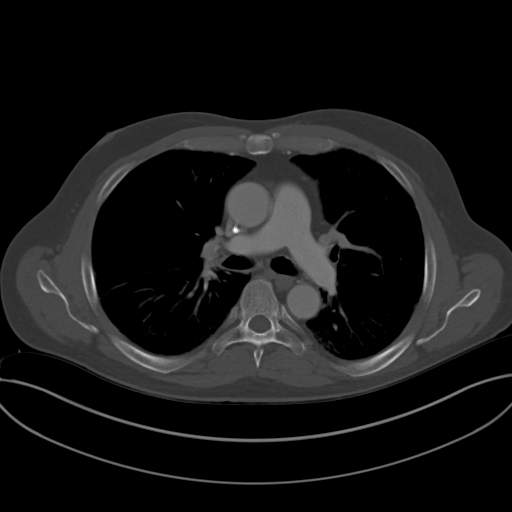
[im 128/141  mediastinal]
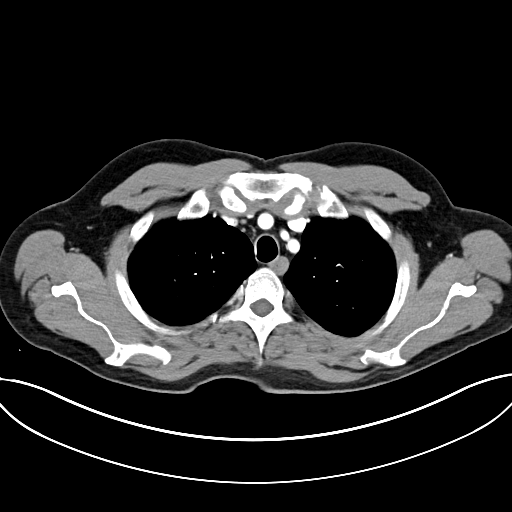

[Series 5: coronals cap 2.00 cor · coronal · 0.81mm/px · 3 of 135 slices shown]
[im 27/135  mediastinal]
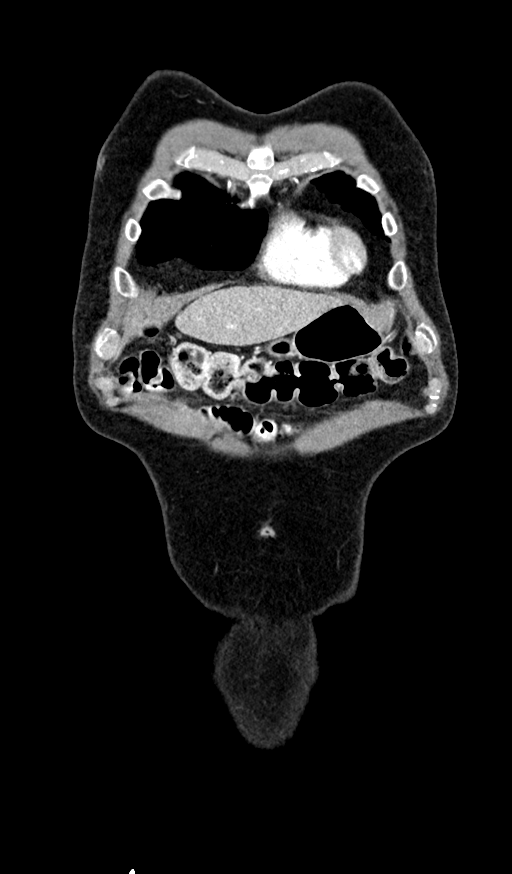
[im 54/135  mediastinal]
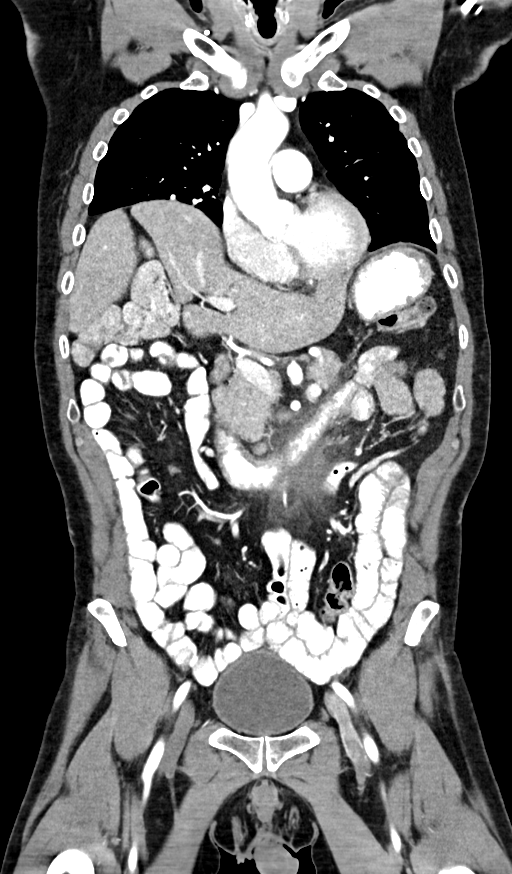
[im 81/135  mediastinal]
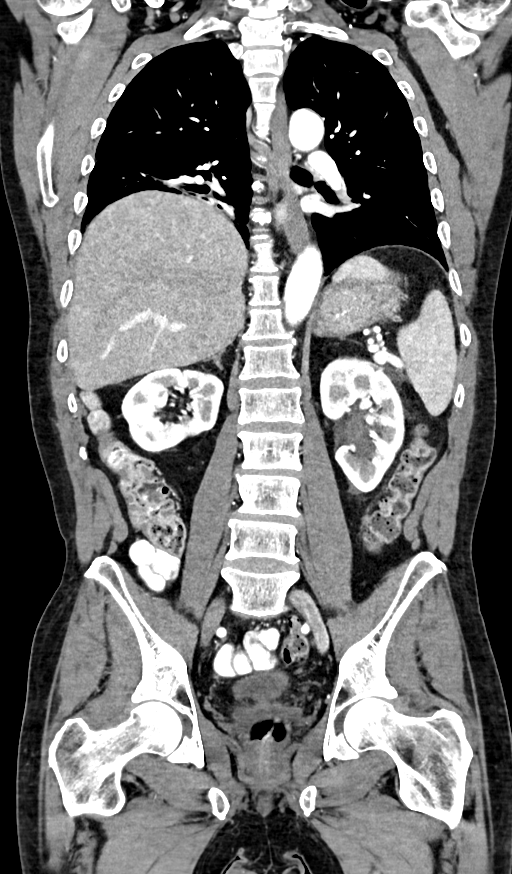

[13 of 36 positions shown; findings below may reference images not displayed]

Central
pulmonary vasculature is normal caliber. Heart size normal without
pericardial effusion. RIGHT-sided Port-A-Cath terminates in the
distal superior vena cava.
FINDINGS: CT CHEST FINDINGS

Cardiovascular: Normal caliber thoracic aorta. Normal heart size. No
pericardial effusion. Normal caliber central pulmonary vessels.
RIGHT sided Port-A-Cath terminates at the caval to atrial junction.

Mediastinum/Nodes: No adenopathy in the chest

Lungs/Pleura: Patchy areas of ground-glass throughout the chest and
some areas of air trapping that shows slow increase over time from
[REDACTED] to Thursday October, 2019 but are perhaps slightly improved as compared
to the previous PET scan of Thursday October, 2019, particularly in the upper
lobe on the RIGHT where there is no ground-glass currently
previously scattered ground-glass in this location. Persistent
mildly nodular appearance of ground-glass in the lower lobes. No
effusion. No consolidation.

Musculoskeletal: See below for full musculoskeletal details.

CT ABDOMEN PELVIS FINDINGS

Hepatobiliary: Hepatic steatosis. No focal, suspicious hepatic
lesion. Post cholecystectomy without biliary duct dilation.

Pancreas: Stranding about the pancreas in the root of the small
bowel mesentery is unchanged compared to previous imaging with
respect to stranding immediately adjacent to the pancreas, improved
soft tissue, see below in the root of the small bowel mesentery. No
ductal dilation or focal lesion in the pancreas.

Spleen: Normal size and contour. Measuring approximately 11 cm
greatest axial dimension as compared to 12 cm on the study February 11, 2019, stable appearance as compared to the study [DATE]

Adrenals/Urinary Tract: Adrenal glands are normal.

Symmetric renal enhancement. No hydronephrosis. No suspicious renal
lesion. Smooth contour of the urinary bladder.

Diminished stranding about the LEFT renal hilum compared to previous
imaging.

Stomach/Bowel: No acute gastrointestinal process. Post partial
colonic resection. Appendix not visualized. No pericecal stranding.

Vascular/Lymphatic: Patent abdominal aorta and its branches. Patent
portal vein and splenic vein. Mild flattening of the IVC with
otherwise smooth contour.

No discrete adenopathy in the retroperitoneum. Soft tissue in the
small bowel mesentery has given way to ground-glass and decreased
density overall compared to previous imaging. This measures
approximately 4.8 x 2.5 cm as compared to 6.9 x 2.9 cm with respect
to the densest area remaining in the root of the small bowel
mesentery. No new mesenteric nodularity.

No signs of pelvic adenopathy.

Reproductive: Prostate unremarkable by CT.

Other: No ascites.

Musculoskeletal: No acute musculoskeletal process or destructive
bone finding. Spinal degenerative changes with pars defects at L5
with grade 2 anterolisthesis of L5 on S1. Similar to prior studies.
IMPRESSION: 1. Continued decrease in size and density of the soft tissue in the
root of the small bowel mesentery, now measuring 4.8 x 2.5 cm as
compared to 6.9 x 2.9 cm. No new mesenteric nodularity.
2. Diminished stranding about the LEFT renal hilum compared to
previous imaging.
3. Patchy areas of ground-glass throughout the chest perhaps
slightly improved compared to recent PET scan particularly in the
RIGHT upper lobe. There may be mild associated developing
bronchiectasis. Findings may represent developing post infectious or
inflammatory fibrosis. Correlation with symptoms and follow-up with
high-resolution CT of the chest as warranted may be helpful. Would
also correlate with any history of drug related pneumonitis that
might explain these findings.
4. Hepatic steatosis.
5. Post partial colonic resection.

Aortic Atherosclerosis ([id number]).

## 2021-04-19 ENCOUNTER — Other Ambulatory Visit: Payer: BC Managed Care – PPO

## 2021-07-09 ENCOUNTER — Ambulatory Visit: Payer: BC Managed Care – PPO | Admitting: Oncology

## 2021-07-09 ENCOUNTER — Other Ambulatory Visit: Payer: BC Managed Care – PPO

## 2021-10-20 IMAGING — DX DG CHEST 1V PORT
1 series · 1 of 1 positions shown · non-contrast
Comparison: Chest radiograph 02/01/2021

CLINICAL DATA: Bilateral pulmonary infiltrates on chest x-ray.

EXAM:
PORTABLE CHEST 1 VIEW

[chest ap]
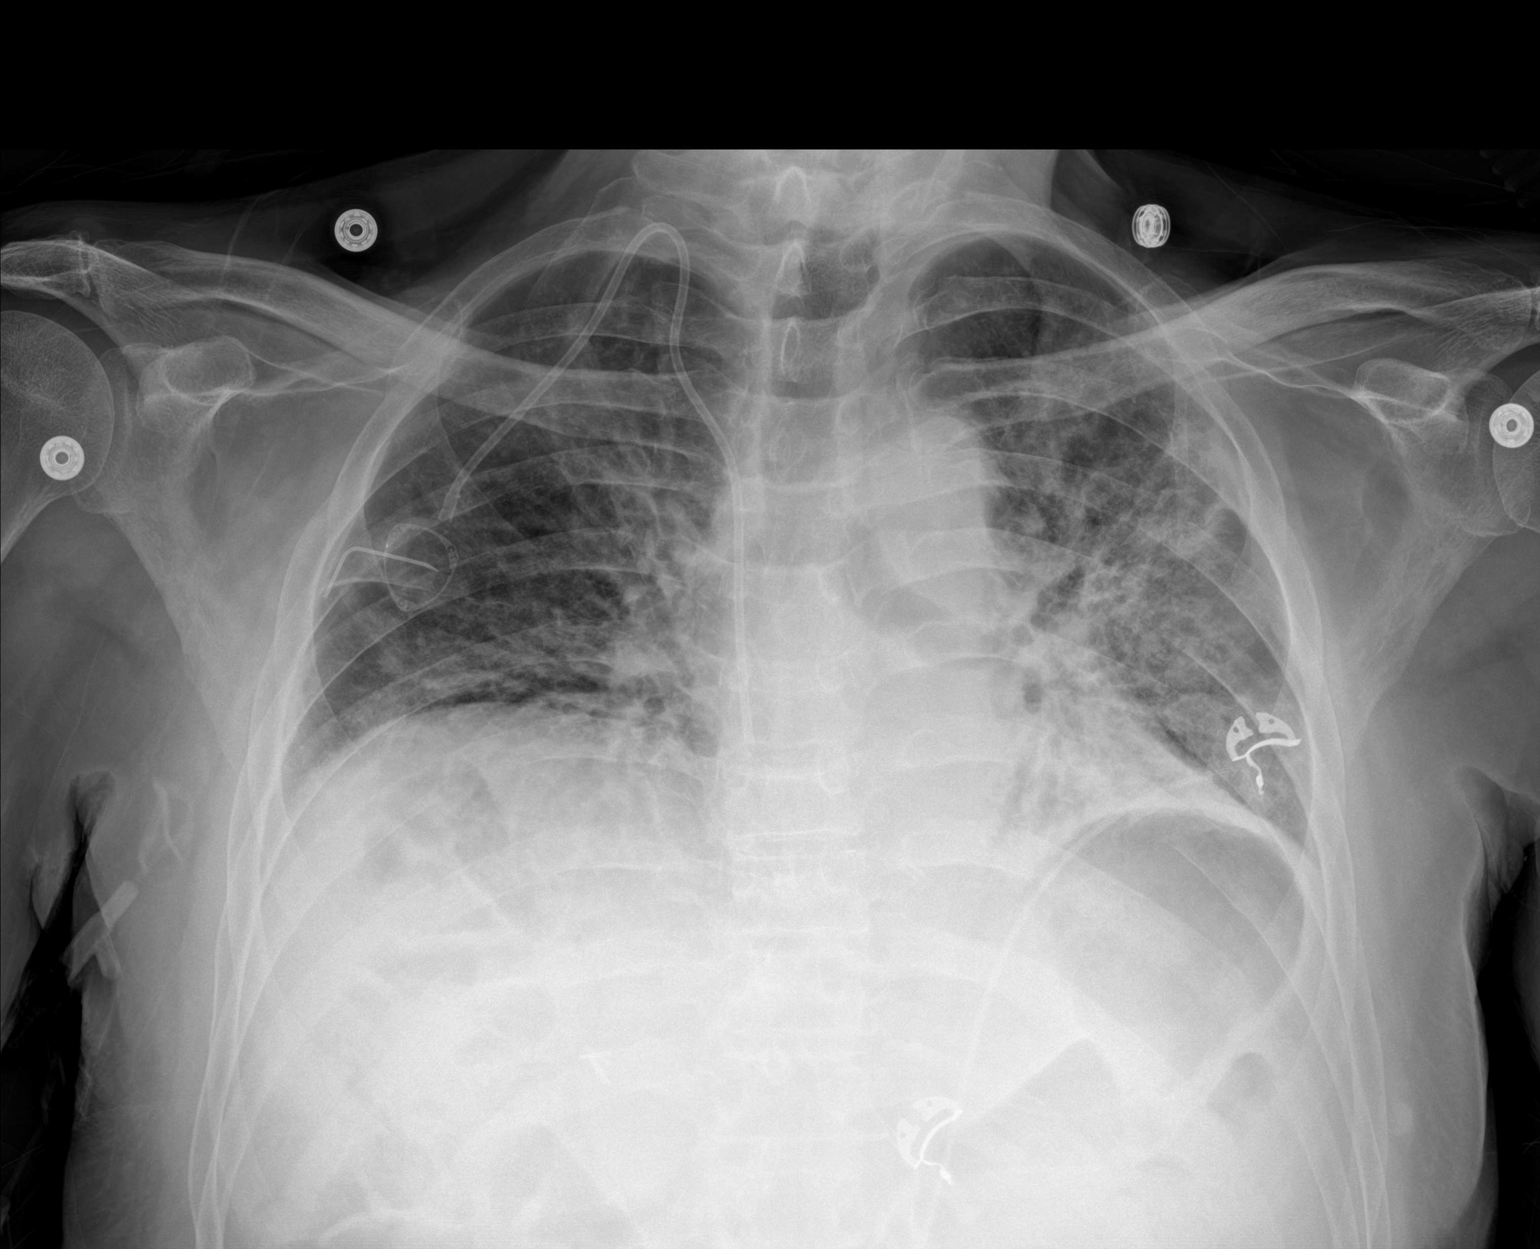

[1 of 1 positions shown; findings below may reference images not displayed]

FINDINGS: Power injectable right IJ port central venous catheter tip projects
at the level of the superior cavoatrial junction. Unchanged low lung
volumes with lower lobe predominant interstitial opacities, left
greater than right. No new focal consolidation, pleural effusion, or
pneumothorax. Heart is normal in size. Surgical clips in the right
upper abdomen.
IMPRESSION: Bilateral interstitial airspace opacities are unchanged compared to
02/01/2021. No new focal consolidation, pleural effusion, or
pneumothorax.

## 2021-10-28 IMAGING — DX DG CHEST 1V PORT
1 series · 1 of 1 positions shown · non-contrast
Comparison: 02/05/2021

CLINICAL DATA: Atelectasis

EXAM:
PORTABLE CHEST 1 VIEW

[chest ap]
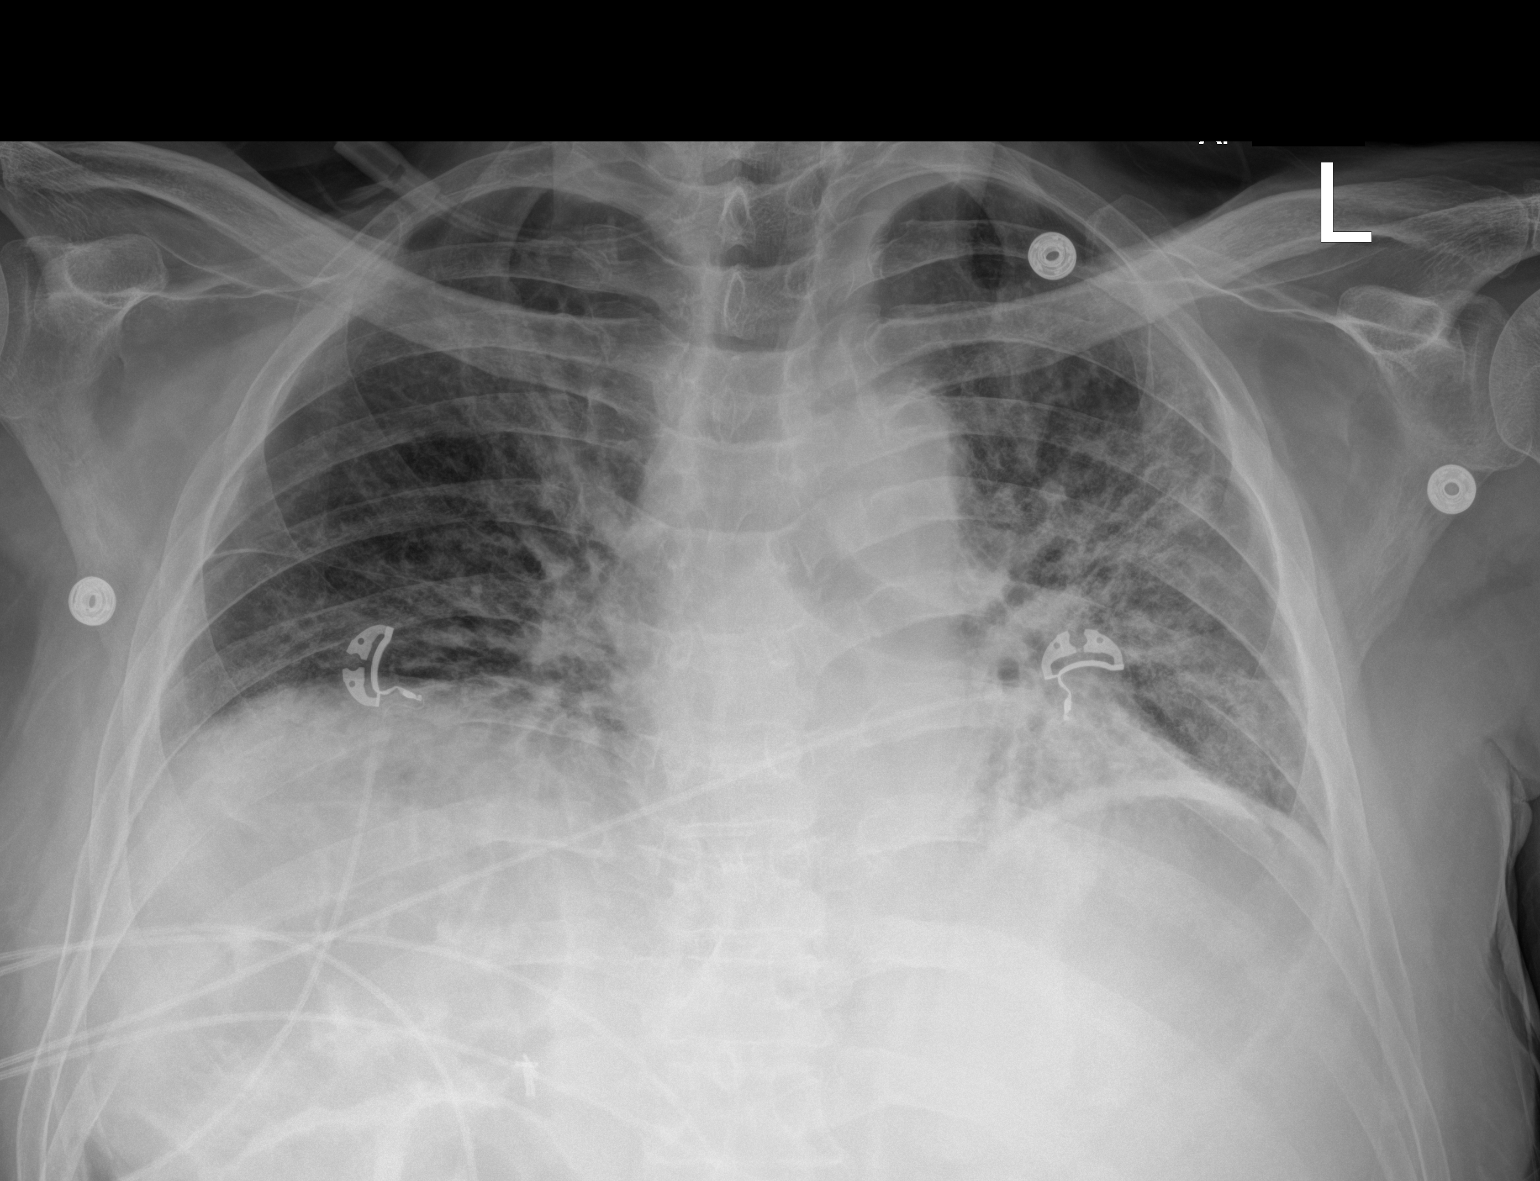

[1 of 1 positions shown; findings below may reference images not displayed]

FINDINGS: Low lung volumes with similar appearance of interstitial lung
disease, left greater than right. No pleural effusion. Right chest
wall port has been removed. Stable cardiomediastinal contours.
IMPRESSION: Interval removal of chest port. Interstitial lung disease with
similar lung aeration to prior study.

## 2021-11-02 IMAGING — DX DG CHEST 1V PORT
1 series · 1 of 1 positions shown · non-contrast
Comparison: 02/13/2021, CT 02/07/2021, radiograph 02/05/2021,
07/29/2019

CLINICAL DATA: Pulmonary fibrosis

EXAM:
PORTABLE CHEST 1 VIEW

[chest ap]
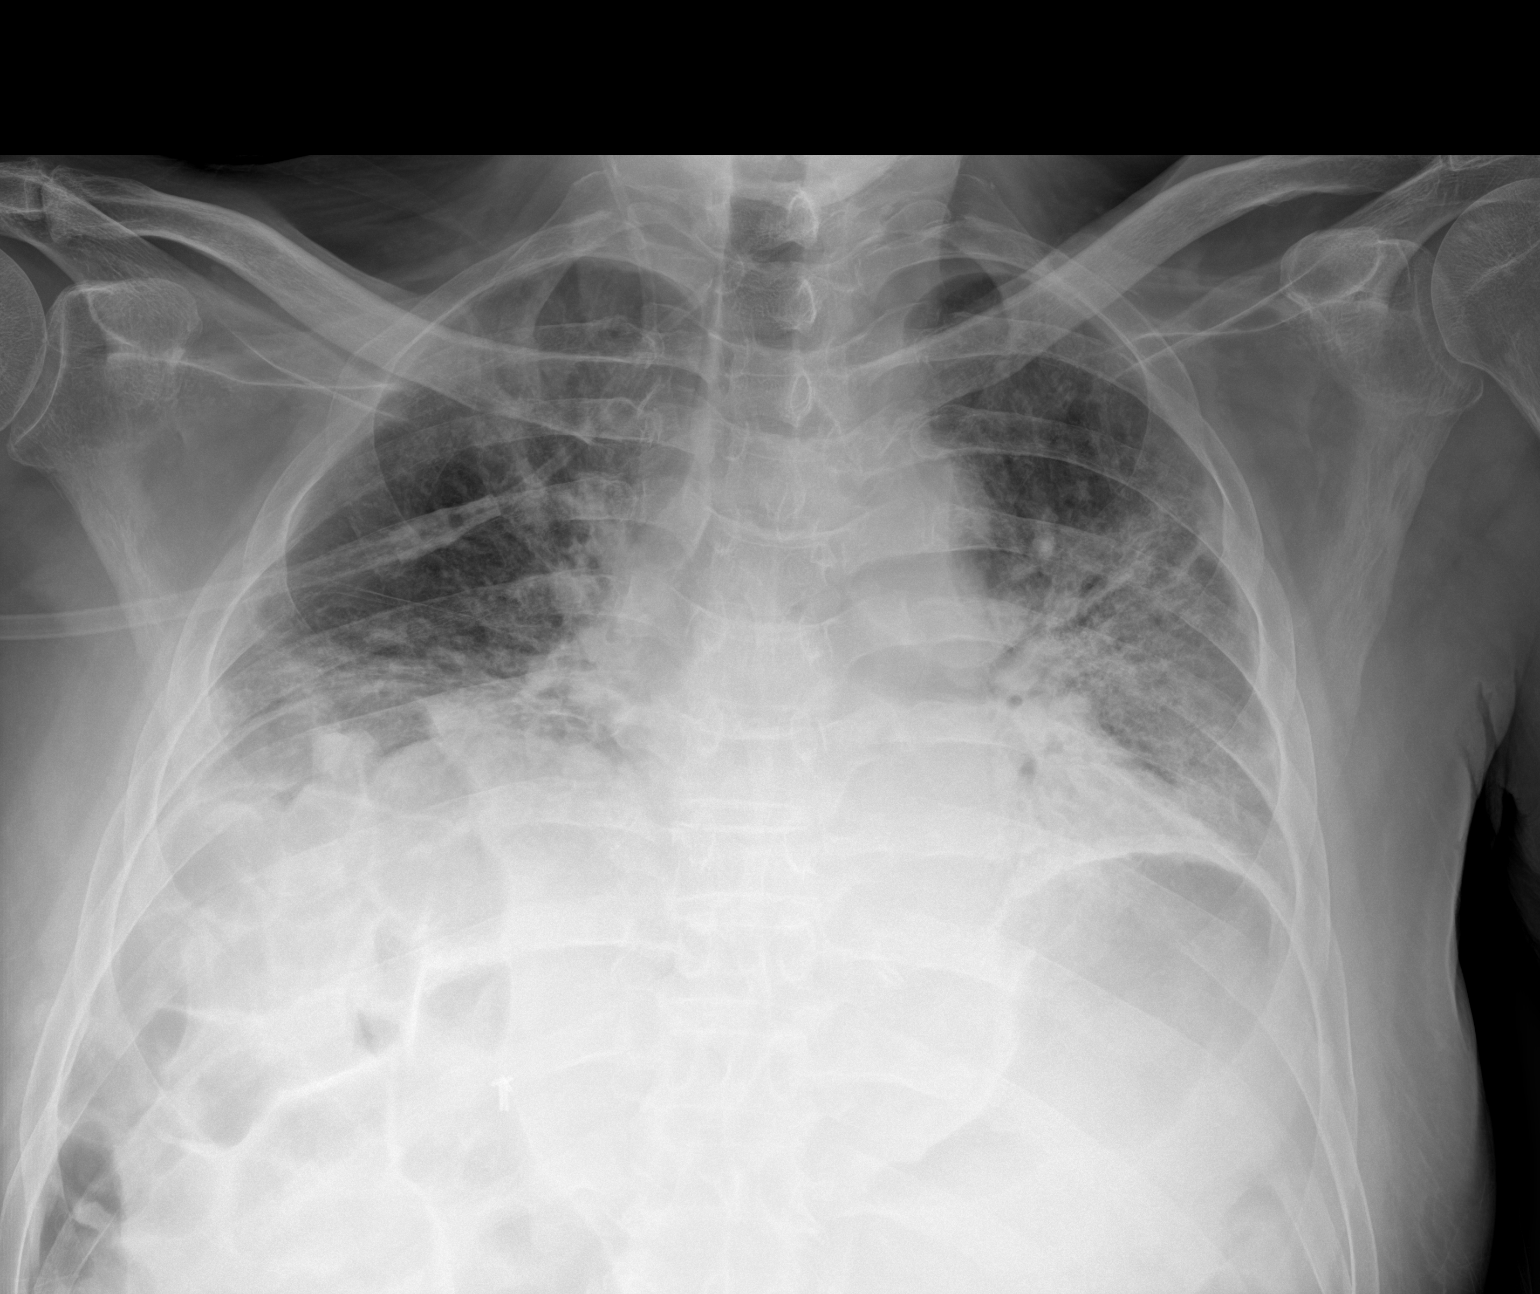

[1 of 1 positions shown; findings below may reference images not displayed]

FINDINGS: Low lung volumes. Bilateral bronchiectasis, fibrosis and left
greater than right ground-glass opacity without significant interval
change. Stable cardiomediastinal silhouette. No pneumothorax.
IMPRESSION: Hypoventilatory change without significant interval change in
appearance of interstitial lung disease compared with most recent
priors.

## 2022-04-01 LAB — HISTOPLASMA GAL'MANNAN AG SER: Histoplasma Gal'mannan Ag Ser: <0.5 (ref <0.5)
